# Patient Record
Sex: Female | Born: 1945
Health system: Southern US, Community
[De-identification: ages and names within clinical notes are randomized; demographics above are authoritative.]

## PROBLEM LIST (undated history)

## (undated) DIAGNOSIS — K635 Polyp of colon: Secondary | ICD-10-CM

## (undated) DIAGNOSIS — D649 Anemia, unspecified: Secondary | ICD-10-CM

## (undated) DIAGNOSIS — M1990 Unspecified osteoarthritis, unspecified site: Secondary | ICD-10-CM

## (undated) DIAGNOSIS — Z973 Presence of spectacles and contact lenses: Secondary | ICD-10-CM

## (undated) DIAGNOSIS — T8859XA Other complications of anesthesia, initial encounter: Secondary | ICD-10-CM

## (undated) DIAGNOSIS — F419 Anxiety disorder, unspecified: Secondary | ICD-10-CM

## (undated) DIAGNOSIS — F32A Depression, unspecified: Secondary | ICD-10-CM

## (undated) DIAGNOSIS — K5790 Diverticulosis of intestine, part unspecified, without perforation or abscess without bleeding: Secondary | ICD-10-CM

## (undated) DIAGNOSIS — D0510 Intraductal carcinoma in situ of unspecified breast: Secondary | ICD-10-CM

## (undated) DIAGNOSIS — M2550 Pain in unspecified joint: Secondary | ICD-10-CM

## (undated) DIAGNOSIS — Z972 Presence of dental prosthetic device (complete) (partial): Secondary | ICD-10-CM

## (undated) DIAGNOSIS — E785 Hyperlipidemia, unspecified: Secondary | ICD-10-CM

## (undated) DIAGNOSIS — R112 Nausea with vomiting, unspecified: Secondary | ICD-10-CM

## (undated) DIAGNOSIS — Z800 Family history of malignant neoplasm of digestive organs: Secondary | ICD-10-CM

## (undated) DIAGNOSIS — K08109 Complete loss of teeth, unspecified cause, unspecified class: Secondary | ICD-10-CM

## (undated) DIAGNOSIS — C50919 Malignant neoplasm of unspecified site of unspecified female breast: Secondary | ICD-10-CM

## (undated) DIAGNOSIS — C4490 Unspecified malignant neoplasm of skin, unspecified: Secondary | ICD-10-CM

## (undated) DIAGNOSIS — Z9889 Other specified postprocedural states: Secondary | ICD-10-CM

## (undated) DIAGNOSIS — T4145XA Adverse effect of unspecified anesthetic, initial encounter: Secondary | ICD-10-CM

## (undated) HISTORY — DX: Unspecified malignant neoplasm of skin, unspecified: C44.90

## (undated) HISTORY — DX: Diverticulosis of intestine, part unspecified, without perforation or abscess without bleeding: K57.90

## (undated) HISTORY — DX: Pain in unspecified joint: M25.50

## (undated) HISTORY — DX: Hyperlipidemia, unspecified: E78.5

## (undated) HISTORY — DX: Family history of malignant neoplasm of digestive organs: Z80.0

## (undated) HISTORY — DX: Malignant neoplasm of unspecified site of unspecified female breast: C50.919

## (undated) HISTORY — PX: COLONOSCOPY: SHX174

## (undated) HISTORY — DX: Polyp of colon: K63.5

## (undated) HISTORY — PX: POLYPECTOMY: SHX149

## (undated) HISTORY — DX: Intraductal carcinoma in situ of unspecified breast: D05.10

## (undated) HISTORY — PX: DILATION AND CURETTAGE OF UTERUS: SHX78

---

## 2008-01-11 DIAGNOSIS — C50919 Malignant neoplasm of unspecified site of unspecified female breast: Secondary | ICD-10-CM

## 2008-01-11 DIAGNOSIS — D0510 Intraductal carcinoma in situ of unspecified breast: Secondary | ICD-10-CM

## 2008-01-11 HISTORY — DX: Intraductal carcinoma in situ of unspecified breast: D05.10

## 2008-01-11 HISTORY — DX: Malignant neoplasm of unspecified site of unspecified female breast: C50.919

## 2008-12-10 HISTORY — PX: BREAST LUMPECTOMY: SHX2

## 2012-03-28 ENCOUNTER — Telehealth: Payer: Self-pay | Admitting: Family Medicine

## 2012-03-28 NOTE — Telephone Encounter (Signed)
Pt placed 04/05/2012 @ 10:30 a.m.

## 2012-03-28 NOTE — Telephone Encounter (Signed)
Yes but please not on a day that I already have more than 2 new patients.

## 2012-03-28 NOTE — Telephone Encounter (Signed)
Pt just moved here from [place] and needs to est w/a PCP. She has just rec'd notification from a breast ctr in [place] that she needs a biopsy of her right breast and needs to be seen as soon as possible.  In order for her to see someone about it, she needs a referral from a PCP here in [place].  Pt has [facility] HMO and your first available New [facility] Pt appmt isn't until after 07/17/2012.  Can you accommodate her an earlier apptmt?? Thank you.

## 2012-04-05 ENCOUNTER — Encounter: Payer: Self-pay | Admitting: Family Medicine

## 2012-04-05 ENCOUNTER — Ambulatory Visit (INDEPENDENT_AMBULATORY_CARE_PROVIDER_SITE_OTHER): Payer: Medicare HMO | Admitting: Family Medicine

## 2012-04-05 VITALS — BP 120/80 | HR 68 | Temp 98.0°F | Ht 60.25 in | Wt 175.0 lb

## 2012-04-05 DIAGNOSIS — N6310 Unspecified lump in the right breast, unspecified quadrant: Secondary | ICD-10-CM | POA: Insufficient documentation

## 2012-04-05 DIAGNOSIS — N63 Unspecified lump in unspecified breast: Secondary | ICD-10-CM

## 2012-04-05 NOTE — Patient Instructions (Signed)
It was really nice to meet you.  Please stop by to see Shirlee Limerick on your way out.  She will set up your appointment with the [facility].  Please schedule a welcome to medicare visit (ok to do anytime -use two acute slots).

## 2012-04-05 NOTE — Progress Notes (Signed)
  Subjective:    Patient ID: Kayla Mcgrath, female    DOB: [birth year], 67 y.o.   MRN: [id number]  HPI  67 yo here to establish care and to get referral for breast biopsy.  H/o right breast DCIS in 2010.  Has been going for diagnostic mammograms- initially every six months and now yearly. Diag mammogram, additional views and ultrasound were concerning for new masses.  She was advised to get breast biopsy done once she moved here.  No family h/o breast, uterine or cervical cancer.  Patient Active Problem List  Diagnosis  . Breast mass, right   Past Medical History  Diagnosis Date  . DCIS (ductal carcinoma in situ) of breast 2010  . Hyperlipidemia    No past surgical history on file. History  Substance Use Topics  . Smoking status: Former Games developer  . Smokeless tobacco: Not on file  . Alcohol Use: Not on file   Family History  Problem Relation Age of Onset  . Alcohol abuse Mother   . Alcohol abuse Father   . Mental illness Father    Allergies  Allergen Reactions  . Latex Rash   No current outpatient prescriptions on file prior to visit.   No current facility-administered medications on file prior to visit.   The PMH, PSH, Social History, Family History, Medications, and allergies have been reviewed in [employer], and have been updated if relevant.    Review of Systems See HPI   Denies any night sweats, fevers, fatigue or weight loss.   Objective:   Physical Exam BP 120/80  Pulse 68  Temp(Src) 98 F (36.7 C)  Ht 5' 0.25" (1.53 m)  Wt 175 lb (79.379 kg)  BMI 33.91 kg/m2  General:  Well-developed,well-nourished,in no acute distress; alert,appropriate and cooperative throughout examination Head:  normocephalic and atraumatic.   Eyes:  vision grossly intact, pupils equal, pupils round, and pupils reactive to light.   Ears:  R ear normal and L ear normal.   Nose:  no external deformity.   Mouth:  good dentition.   Neck:  No deformities, masses, or tenderness noted. Lungs:   Normal respiratory effort, chest expands symmetrically. Lungs are clear to auscultation, no crackles or wheezes. Heart:  Normal rate and regular rhythm. S1 and S2 normal without gallop, murmur, click, rub or other extra sounds. Msk:  No deformity or scoliosis noted of thoracic or lumbar spine.   Extremities:  No clubbing, cyanosis, edema, or deformity noted with normal full range of motion of all joints.   Neurologic:  alert & oriented X3 and gait normal.   Skin:  Intact without suspicious lesions or rashes Psych:  Cognition and judgment appear intact. Alert and cooperative with normal attention span and concentration. No apparent delusions, illusions, hallucinations    Assessment & Plan:  1. Breast mass, right Refer to Breast Center for biopsy. The patient indicates understanding of these issues and agrees with the plan.  - Ambulatory referral to Breast Clinic

## 2012-04-12 ENCOUNTER — Other Ambulatory Visit: Payer: Self-pay | Admitting: Family Medicine

## 2012-04-12 DIAGNOSIS — C50919 Malignant neoplasm of unspecified site of unspecified female breast: Secondary | ICD-10-CM

## 2012-04-12 DIAGNOSIS — Z853 Personal history of malignant neoplasm of breast: Secondary | ICD-10-CM

## 2012-04-12 DIAGNOSIS — R921 Mammographic calcification found on diagnostic imaging of breast: Secondary | ICD-10-CM

## 2012-04-13 ENCOUNTER — Other Ambulatory Visit: Payer: Self-pay | Admitting: Family Medicine

## 2012-04-13 ENCOUNTER — Telehealth: Payer: Self-pay | Admitting: *Deleted

## 2012-04-13 DIAGNOSIS — Z853 Personal history of malignant neoplasm of breast: Secondary | ICD-10-CM

## 2012-04-13 DIAGNOSIS — N6310 Unspecified lump in the right breast, unspecified quadrant: Secondary | ICD-10-CM

## 2012-04-13 NOTE — Telephone Encounter (Signed)
Signed and on my desk 

## 2012-04-13 NOTE — Telephone Encounter (Signed)
Order form for diagnostic mammogram is on your desk for signature.

## 2012-04-23 ENCOUNTER — Other Ambulatory Visit: Payer: Self-pay | Admitting: Family Medicine

## 2012-04-23 DIAGNOSIS — Z0000 Encounter for general adult medical examination without abnormal findings: Secondary | ICD-10-CM

## 2012-04-23 DIAGNOSIS — Z136 Encounter for screening for cardiovascular disorders: Secondary | ICD-10-CM

## 2012-04-24 ENCOUNTER — Ambulatory Visit
Admission: RE | Admit: 2012-04-24 | Discharge: 2012-04-24 | Disposition: A | Discharge status: Home / Self Care | Payer: Medicare HMO | Source: Ambulatory Visit | Attending: Family Medicine | Admitting: Family Medicine

## 2012-04-24 DIAGNOSIS — Z853 Personal history of malignant neoplasm of breast: Secondary | ICD-10-CM

## 2012-04-24 DIAGNOSIS — R921 Mammographic calcification found on diagnostic imaging of breast: Secondary | ICD-10-CM

## 2012-04-24 DIAGNOSIS — N6310 Unspecified lump in the right breast, unspecified quadrant: Secondary | ICD-10-CM

## 2012-04-24 HISTORY — PX: BREAST BIOPSY: SHX20

## 2012-04-27 ENCOUNTER — Telehealth: Payer: Self-pay | Admitting: *Deleted

## 2012-04-27 DIAGNOSIS — C50211 Malignant neoplasm of upper-inner quadrant of right female breast: Secondary | ICD-10-CM | POA: Insufficient documentation

## 2012-04-27 NOTE — Telephone Encounter (Signed)
Confirmed [facility] for 05/02/12 at 0800 .  Instructions and contact information given. 

## 2012-04-30 ENCOUNTER — Encounter: Payer: Self-pay | Admitting: Family Medicine

## 2012-04-30 ENCOUNTER — Other Ambulatory Visit (INDEPENDENT_AMBULATORY_CARE_PROVIDER_SITE_OTHER): Payer: Medicare HMO

## 2012-04-30 DIAGNOSIS — Z0000 Encounter for general adult medical examination without abnormal findings: Secondary | ICD-10-CM

## 2012-04-30 DIAGNOSIS — Z136 Encounter for screening for cardiovascular disorders: Secondary | ICD-10-CM

## 2012-04-30 LAB — LIPID PANEL
Cholesterol: 244 mg/dL — ABNORMAL HIGH (ref 0–200)
Total CHOL/HDL Ratio: 4
Triglycerides: 89 mg/dL (ref 0.0–149.0)
VLDL: 17.8 mg/dL (ref 0.0–40.0)

## 2012-04-30 LAB — CBC WITH DIFFERENTIAL/PLATELET
Basophils Absolute: 0 10*3/uL (ref 0.0–0.1)
Eosinophils Relative: 3.9 % (ref 0.0–5.0)
Lymphocytes Relative: 20.7 % (ref 12.0–46.0)
Monocytes Relative: 7.8 % (ref 3.0–12.0)
Platelets: 251 10*3/uL (ref 150.0–400.0)
RDW: 13.5 % (ref 11.5–14.6)
WBC: 7.1 10*3/uL (ref 4.5–10.5)

## 2012-04-30 LAB — COMPREHENSIVE METABOLIC PANEL
AST: 28 U/L (ref 0–37)
Alkaline Phosphatase: 84 U/L (ref 39–117)
Glucose, Bld: 91 mg/dL (ref 70–99)
Potassium: 4.5 mEq/L (ref 3.5–5.1)
Sodium: 139 mEq/L (ref 135–145)
Total Bilirubin: 0.8 mg/dL (ref 0.3–1.2)
Total Protein: 7.7 g/dL (ref 6.0–8.3)

## 2012-04-30 LAB — LDL CHOLESTEROL, DIRECT: Direct LDL: 161.9 mg/dL

## 2012-05-02 ENCOUNTER — Encounter: Payer: Self-pay | Admitting: Oncology

## 2012-05-02 ENCOUNTER — Ambulatory Visit: Payer: Medicare HMO

## 2012-05-02 ENCOUNTER — Other Ambulatory Visit (HOSPITAL_BASED_OUTPATIENT_CLINIC_OR_DEPARTMENT_OTHER): Payer: Medicare HMO | Admitting: Lab

## 2012-05-02 ENCOUNTER — Other Ambulatory Visit: Payer: Self-pay | Admitting: *Deleted

## 2012-05-02 ENCOUNTER — Ambulatory Visit (HOSPITAL_BASED_OUTPATIENT_CLINIC_OR_DEPARTMENT_OTHER): Payer: Medicare HMO | Admitting: Oncology

## 2012-05-02 ENCOUNTER — Encounter: Payer: Self-pay | Admitting: *Deleted

## 2012-05-02 ENCOUNTER — Ambulatory Visit
Admission: RE | Admit: 2012-05-02 | Discharge: 2012-05-02 | Disposition: A | Discharge status: Home / Self Care | Payer: Medicare HMO | Source: Ambulatory Visit | Attending: Radiation Oncology | Admitting: Radiation Oncology

## 2012-05-02 ENCOUNTER — Ambulatory Visit: Payer: Medicare HMO | Attending: General Surgery | Admitting: Physical Therapy

## 2012-05-02 ENCOUNTER — Ambulatory Visit (HOSPITAL_BASED_OUTPATIENT_CLINIC_OR_DEPARTMENT_OTHER): Payer: Medicare HMO | Admitting: General Surgery

## 2012-05-02 ENCOUNTER — Telehealth: Payer: Self-pay | Admitting: Oncology

## 2012-05-02 VITALS — BP 144/80 | HR 84 | Temp 98.0°F | Resp 20 | Ht 60.0 in | Wt 176.1 lb

## 2012-05-02 DIAGNOSIS — C50219 Malignant neoplasm of upper-inner quadrant of unspecified female breast: Secondary | ICD-10-CM

## 2012-05-02 DIAGNOSIS — C50919 Malignant neoplasm of unspecified site of unspecified female breast: Secondary | ICD-10-CM | POA: Insufficient documentation

## 2012-05-02 DIAGNOSIS — IMO0001 Reserved for inherently not codable concepts without codable children: Secondary | ICD-10-CM | POA: Insufficient documentation

## 2012-05-02 DIAGNOSIS — C50211 Malignant neoplasm of upper-inner quadrant of right female breast: Secondary | ICD-10-CM

## 2012-05-02 DIAGNOSIS — N63 Unspecified lump in unspecified breast: Secondary | ICD-10-CM

## 2012-05-02 DIAGNOSIS — N6310 Unspecified lump in the right breast, unspecified quadrant: Secondary | ICD-10-CM

## 2012-05-02 DIAGNOSIS — R293 Abnormal posture: Secondary | ICD-10-CM | POA: Insufficient documentation

## 2012-05-02 LAB — COMPREHENSIVE METABOLIC PANEL (CC13)
AST: 25 U/L (ref 5–34)
Albumin: 3.6 g/dL (ref 3.5–5.0)
BUN: 14.1 mg/dL (ref 7.0–26.0)
CO2: 26 mEq/L (ref 22–29)
Calcium: 10.5 mg/dL — ABNORMAL HIGH (ref 8.4–10.4)
Chloride: 105 mEq/L (ref 98–107)
Glucose: 98 mg/dl (ref 70–99)
Potassium: 4.1 mEq/L (ref 3.5–5.1)

## 2012-05-02 LAB — CBC WITH DIFFERENTIAL/PLATELET
Basophils Absolute: 0 10*3/uL (ref 0.0–0.1)
EOS%: 2 % (ref 0.0–7.0)
Eosinophils Absolute: 0.1 10*3/uL (ref 0.0–0.5)
HGB: 14 g/dL (ref 11.6–15.9)
MONO#: 0.4 10*3/uL (ref 0.1–0.9)
NEUT#: 3.4 10*3/uL (ref 1.5–6.5)
RDW: 13.1 % (ref 11.2–14.5)
WBC: 5.1 10*3/uL (ref 3.9–10.3)
lymph#: 1.2 10*3/uL (ref 0.9–3.3)

## 2012-05-02 NOTE — Assessment & Plan Note (Signed)
Patient has a new diagnosis of right breast cancer. This is an in breast recurrence from her prior DCIS. She also has an area of ADH. She is interested in breast conservation. We will perform an MRI to make sure there are no additional lesions that we need to worry about. As long as this is negative we will plan to do to needle localized lumpectomy is with a right sentinel lymph node biopsy. She does have generous sized breasts. I think she would have a reasonable cosmetic result with 2 lumpectomies.  She is interested postoperatively in seeking reduction on the opposite side. She is having her MRI next week. Once this result is available, we will schedule her surgery otherwise she doesn't need additional MRI guided biopsies.  The surgical procedure was described to the patient.  I discussed the incision type and location and that we would need radiology involved on the day of surgery with a wire marker and sentinel node biopsy.      The risks and benefits of the procedure were described to the patient and he/she wishes to proceed.    We discussed the risks bleeding, infection, damage to other structures, need for further procedures/surgeries.  We discussed the risk of seroma.  The patient was advised if the area in the breast in cancer, we may need to go back to surgery for additional tissue to obtain negative margins or for a lymph node biopsy. The patient was advised that these are the most common complications, but that others can occur as well.  They were advised against taking aspirin or other anti-inflammatory agents/blood thinners the week before surgery.

## 2012-05-02 NOTE — Progress Notes (Signed)
Chief Complaint  Patient presents with  . Breast Cancer    HISTORY: Patient is a 67 year old female who presents with a  right breast mass detected on followup mammograms.. She has a history of DCIS in 2010 and that was detected when she had a major duct excision for nipple discharge.  For reasons that are not clear to me, she did not undergo radiation. She took tamoxifen for a while but stopped because she is only taking medication. She now has 2 areas of concern seen on her mammogram. There is a area at 2:00 that was 7 mm and an area 9:00 that was 2 mm. These haven't been biopsied. The 2:00 lesion showed invasive ductal carcinoma with ER, PR receptors positive and HER-2 not overexpressed. The Ki-67 was 35%. The other lesion at 9:00 with atypical ductal hyperplasia.  She has no family history of cancer.  Menarche age 18. She is no longer having periods and has not had periods for around 16 years. She has not used hormone replacement but did use some hormonal contraception. She had 2 children her first at age 21. She's been married for 46 years. She has had a colonoscopy, bone density scan and a Pap smear.  Past Medical History  Diagnosis Date  . DCIS (ductal carcinoma in situ) of breast 2010  . Hyperlipidemia   . Breast cancer   . Skin cancer   . Joint pain     Past Surgical History  Procedure Laterality Date  . Right lumpectomy  december 2010    Current Outpatient Prescriptions  Medication Sig Dispense Refill  . Cholecalciferol (VITAMIN D) 2000 UNITS CAPS Take one by mouth daily       No current facility-administered medications for this visit.     Allergies  Allergen Reactions  . Latex Rash     Family History  Problem Relation Age of Onset  . Alcohol abuse Mother   . Alcohol abuse Father   . Mental illness Father      History   Social History  . Marital Status: Married    Spouse Name: N/A    Number of Children: N/A  . Years of Education: N/A   Social History  Main Topics  . Smoking status: Former Games developer  . Smokeless tobacco: Not on file  . Alcohol Use: No  . Drug Use: No  . Sexually Active: Not on file   Social History Narrative   Recently moved from [place] to be closer to her two daughters and grandchildren.   Retired Public house manager.     REVIEW OF SYSTEMS - PERTINENT POSITIVES ONLY: 12 point review of systems negative other than HPI and PMH except for history of kidney infections, glasses, dentures, joint pain.    EXAM: Wt Readings from Last 3 Encounters:  05/02/12 176 lb 1.6 oz (79.878 kg)  04/05/12 175 lb (79.379 kg)   Temp Readings from Last 3 Encounters:  05/02/12 98 F (36.7 C) Oral  04/05/12 98 F (36.7 C)    BP Readings from Last 3 Encounters:  05/02/12 144/80  04/05/12 120/80   Pulse Readings from Last 3 Encounters:  05/02/12 84  04/05/12 68    Gen:  No acute distress.  Well nourished and well groomed.   Neurological: Alert and oriented to person, place, and time. Coordination normal.  Head: Normocephalic and atraumatic.  Eyes: Conjunctivae are normal. Pupils are equal, round, and reactive to light. No scleral icterus.  Neck: Normal range of motion. Neck supple. No tracheal deviation  or thyromegaly present.  Cardiovascular: Normal rate, regular rhythm, normal heart sounds and intact distal pulses.  Exam reveals no gallop and no friction rub.  No murmur heard. Respiratory: Effort normal.  No respiratory distress. No chest wall tenderness. Breath sounds normal.  No wheezes, rales or rhonchi.  Breast:  Ptotic breasts bilaterally.  No palpable masses, skin dimpling, nipple discharge, nipple retraction.  There is no axillary lymphadenopathy. GI: Soft. Bowel sounds are normal. The abdomen is soft and nontender.  There is no rebound and no guarding.  Musculoskeletal: Normal range of motion. Extremities are nontender.  Lymphadenopathy: No cervical, preauricular, postauricular or axillary adenopathy is present Skin: Skin is warm and dry.  No rash noted. No diaphoresis. No erythema. No pallor. No clubbing, cyanosis, or edema.   Psychiatric: Normal mood and affect. Behavior is normal. Judgment and thought content normal.    LABORATORY RESULTS: Available labs are reviewed  CBC, CMET normal other than sl elevated calcium  RADIOLOGY RESULTS: See E-Chart or I-Site for most recent results.  Images and reports are reviewed. Mammogram 2 o'clock 0.7 cm mass calcs at 9 o'clock 0.2 cm near lumpectomy   ASSESSMENT AND PLAN: Cancer of upper-inner quadrant of female breast Patient has a new diagnosis of right breast cancer. This is an in breast recurrence from her prior DCIS. She also has an area of ADH. She is interested in breast conservation. We will perform an MRI to make sure there are no additional lesions that we need to worry about. As long as this is negative we will plan to do to needle localized lumpectomy is with a right sentinel lymph node biopsy. She does have generous sized breasts. I think she would have a reasonable cosmetic result with 2 lumpectomies.  She is interested postoperatively in seeking reduction on the opposite side. She is having her MRI next week. Once this result is available, we will schedule her surgery otherwise she doesn't need additional MRI guided biopsies.  The surgical procedure was described to the patient.  I discussed the incision type and location and that we would need radiology involved on the day of surgery with a wire marker and sentinel node biopsy.      The risks and benefits of the procedure were described to the patient and he/she wishes to proceed.    We discussed the risks bleeding, infection, damage to other structures, need for further procedures/surgeries.  We discussed the risk of seroma.  The patient was advised if the area in the breast in cancer, we may need to go back to surgery for additional tissue to obtain negative margins or for a lymph node biopsy. The patient was advised  that these are the most common complications, but that others can occur as well.  They were advised against taking aspirin or other anti-inflammatory agents/blood thinners the week before surgery.     If the second area at 9 o'clock is cancer, we would recommend completion mastectomy.  Maudry Diego MD Surgical Oncology, General and Endocrine Surgery [facility]   Visit Diagnoses: 1. Cancer of upper-inner quadrant of female breast, right     Primary Care Physician: Ruthe Mannan, MD   [facility]

## 2012-05-02 NOTE — Progress Notes (Signed)
Checked in new pt with no financial concerns. °

## 2012-05-02 NOTE — Progress Notes (Signed)
Kayla Mcgrath   DOB: Dec 28, 1945  MR#: [id number]  WGN#:[id number]  PCP: Ruthe Mannan, MD GYN:  SUAlmond Lint OTHER MD: Lurline Hare; Rosalva Ferron (FAX [phone])   HISTORY OF PRESENT ILLNESS: Kayla Mcgrath underwent right lumpectomy in December of 2010 for a 1.2 cm ductal carcinoma in situ, intermediate grade, with close but negative margins (1 mm). I do not have the prognostic panel, but per patient and history the tumor was estrogen receptor positive and Ronald took tamoxifen for approximately one year. She declined radiation.  She was then followed closely, with mammography every 6 months. On 03/08/2012 a new area of amorphous calcifications measuring 2 mm were noted at the 9:00 position of the right breast. There was also an isodense oval mass measuring 7 mm at the 2:00 position in the same breast. Ultrasound of the right breast confirmed a 6 mm oval hypoechoic mass with microlobulated margins.  The patient was referred to the breast Center for biopsy, and on for 15 2014 underwent biopsy of the small area of calcifications in the right breast as well as the breast mass in the same breast. These showed ([id number]) invasive ductal carcinoma, grade 2 or 3, estrogen receptor 100% positive, progesterone receptor 10% positive, with an MIB-1 of 35%, and no HER-2 amplification. The small area of calcification showed only atypical ductal hyperplasia.  The patient's subsequent history is as detailed below.  INTERVAL HISTORY: Kayla Mcgrath was seen at the multidisciplinary breast cancer clinic 05/02/2012 accompanied by her husband Kayla Mcgrath. Her case was also discussed at conference this morning.  REVIEW OF SYSTEMS: She had no symptoms leading to her mammography, which was routine. She tolerated her biopsies without unusual side effects. She denies headaches, visual changes, dizziness or gait imbalance, nausea, vomiting, cough, phlegm production, pleurisy, or shortness of breath. She has mild stress incontinence.  She has occasional joint pains, which are not more intense or persistent than usual. She had what by her report is a squamous cell carcinoma removed from her right arm remotely. There is no history of melanoma. A detailed review of systems today was otherwise noncontributory  PAST MEDICAL HISTORY: Past Medical History  Diagnosis Date  . DCIS (ductal carcinoma in situ) of breast 2010  . Hyperlipidemia   . Breast cancer   . Skin cancer   . Joint pain     PAST SURGICAL HISTORY: Past Surgical History  Procedure Laterality Date  . Right lumpectomy  december 2010    FAMILY HISTORY Family History  Problem Relation Age of Onset  . Alcohol abuse Mother   . Alcohol abuse Father   . Mental illness Father    the patient's father committed suicide at the age of 7. The patient's mother died at the age of 31 in the setting of alcohol abuse. The patient has one brother, 2 sisters. There is no history of breast or ovarian cancer in the family to the patient's knowledge.  GYNECOLOGIC HISTORY: Menarche age 52, first live birth age 42, the patient is GX P2. She went through menopause approximately 1998. She did not take hormone replacement.  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 Her husband Kayla Mcgrath, currently retired, used to Public house manager. Daughter Kayla Mcgrath lives in [place] and works as an Charity fundraiser for [employer] and also at [employer] in [place]. Daughter Kayla Mcgrath l is a homemaker in [place]. The patient has 6 grandchildren and one on the way. She is a International aid/development worker.   ADVANCED DIRECTIVES: Not in  place  HEALTH MAINTENANCE: History  Substance Use Topics  . Smoking status: Former Games developer  . Smokeless tobacco: Not on file  . Alcohol Use: No     Colonoscopy: 2009  PAP: 2012  Bone density: 2012  Lipid panel:  Allergies  Allergen Reactions  . Latex Rash    Current Outpatient Prescriptions  Medication Sig Dispense Refill  . Cholecalciferol  (VITAMIN D) 2000 UNITS CAPS Take one by mouth daily       No current facility-administered medications for this visit.    OBJECTIVE: Middle-aged white woman in no acute distress Filed Vitals:   05/02/12 0836  BP: 144/80  Pulse: 84  Temp: 98 F (36.7 C)  Resp: 20     Body mass index is 34.39 kg/(m^2).    ECOG FS: 0  Sclerae unicteric Oropharynx clear No cervical or supraclavicular adenopathy Lungs no rales or rhonchi Heart regular rate and rhythm Abd benign MSK scoliosis but no focal spinal tenderness, no peripheral edema Neuro: nonfocal, well oriented, pleasant affect Breasts: The right breast is status post recent biopsy, with a small ecchymosis visible. There is no palpable mass. There is no other skin change or nipple retraction. There is no palpable adenopathy in the right axilla. The left breast is also status post recent biopsy. There are no skin or nipple changes in the left axilla also is unremarkable.   LAB RESULTS: Lab Results  Component Value Date   WBC 5.1 05/02/2012   NEUTROABS 3.4 05/02/2012   HGB 14.0 05/02/2012   HCT 42.9 05/02/2012   MCV 92.1 05/02/2012   PLT 219 05/02/2012      Chemistry      Component Value Date/Time   NA 140 05/02/2012 0822   NA 139 04/30/2012 0903   K 4.1 05/02/2012 0822   K 4.5 04/30/2012 0903   CL 105 05/02/2012 0822   CL 104 04/30/2012 0903   CO2 26 05/02/2012 0822   CO2 28 04/30/2012 0903   BUN 14.1 05/02/2012 0822   BUN 16 04/30/2012 0903   CREATININE 0.8 05/02/2012 0822   CREATININE 0.8 04/30/2012 0903      Component Value Date/Time   CALCIUM 10.5* 05/02/2012 0822   CALCIUM 10.0 04/30/2012 0903   ALKPHOS 96 05/02/2012 0822   ALKPHOS 84 04/30/2012 0903   AST 25 05/02/2012 0822   AST 28 04/30/2012 0903   ALT 21 05/02/2012 0822   ALT 26 04/30/2012 0903   BILITOT 0.43 05/02/2012 0822   BILITOT 0.8 04/30/2012 0903       No results found for this basename: LABCA2    No components found with this basename: LABCA125    No results found  for this basename: INR,  in the last 168 hours  Urinalysis No results found for this basename: colorurine,  appearanceur,  labspec,  phurine,  glucoseu,  hgbur,  bilirubinur,  ketonesur,  proteinur,  urobilinogen,  nitrite,  leukocytesur    STUDIES: Mm Rt Breast Bx W Loc Dev 1st Lesion Image Bx Spec Stereo Guide  04/26/2012  **ADDENDUM** CREATED: 04/26/2012 13:58:07  The patient was called by myself Dr. Micheline Maze.  The patient states she is doing well since her stereotactic right breast biopsy without problems at the biopsy site.  The patient was given the results of her stereotactic right breast biopsy which was compatible with [facility]/ [facility].  The pathology results are concordant with imaging findings. Patient's immediate questions and concerns were addressed.  She was given an appointment to the Multidisciplinary Clinic  05/02/2012 and 02:00 p.m.  She will be contacted by a representative from the [facility] with further information.  **END ADDENDUM** SIGNED BY: Melton Alar. Micheline Maze, M.D.   04/24/2012  *RADIOLOGY REPORT*  Clinical Data:  Right breast calcifications.  Please see the right breast ultrasound core biopsy report for history as well.  STEREOTACTIC-GUIDED VACUUM ASSISTED BIOPSY OF THE RIGHT BREAST AND SPECIMEN RADIOGRAPH  Comparison: Previous exams.  I met with the patient and we discussed the procedure of stereotactic-guided biopsy, including benefits and alternatives. We discussed the high likelihood of a successful procedure. We discussed the risks of the procedure, including infection, bleeding, tissue injury, clip migration, and inadequate sampling. Informed, written consent was given.  Using sterile technique, 2% lidocaine, stereotactic guidance, and a 9 gauge vacuum assisted device, biopsy was performed of the tiny cluster of calcifications located laterally within the right breast using a inferior craniocaudal approach.  Specimen radiograph was performed, showing representative calcifications within the  specimen.  Specimens with calcifications are identified for pathology.  At the conclusion of the procedure, a T-shaped tissue marker clip was deployed into the biopsy cavity.  Follow-up 2-view mammogram confirmed clip to be in appropriate position. Also, the ribbon shaped clip placed within the right breast during ultrasound core biopsy procedure was in appropriate position.  IMPRESSION: Stereotactic-guided biopsy of right breast calcifications as discussed above.  No apparent complications. Appropriate positioning of clips following right breast ultrasound guided core biopsy and right breast stereotactic core biopsy.   Original Report Authenticated By: Rolla Plate, M.D.    [place] Rt Breast Bx W Loc Dev 1st Lesion Img Bx Spec US Guide  04/26/2012  **ADDENDUM** CREATED: 04/26/2012 13:26:54  The patient was called by myself Dr. Micheline Maze.  The patient states she is doing well since her ultrasound core right breast biopsy.  She reports no problems at the biopsy site.  The patient was given the results of her biopsy which was compatible with invasive ductal carcinoma grade II as this is concordant with imaging findings. Patient's immediate questions and concerns were addressed.  The patient was given a follow-up appointment at the Multidisciplinary Clinic 05/02/2012 at 02:00 p.m.  She will be contacted by a representative from the [facility] with further information.  **END ADDENDUM** SIGNED BY: Melton Alar. Micheline Maze, M.D.   04/24/2012  *RADIOLOGY REPORT*  Clinical Data:  The patient has a history of a malignant lumpectomy of the right breast in [place] in 2010 (following a central duct excision for spontaneous nipple discharge).  She declined radiation therapy and tamoxifen therapy at that time.  She was recently found on imaging in [place] to have a right breast mass located at the 2 o'clock position and new suspicious calcifications located within the lateral right breast.  Imaging guided biopsies of these areas were  recommended.  The patient has recently moved to [place], [place] and has been referred to our facility for these biopsy procedures.  ULTRASOUND GUIDED CORE BIOPSY OF THE right BREAST  Comparison: Mammograms from [place] dated 03/08/2012, 01/20/2011, 12/10/2009 and right breast ultrasound dated 03/08/2012.  I met with the patient and we discussed the procedure of ultrasound- guided biopsy, including benefits and alternatives.  We discussed the high likelihood of a successful procedure. We discussed the risks of the procedure, including infection, bleeding, tissue injury, clip migration, and inadequate sampling.  Informed written consent was given.  Using sterile technique 2% lidocaine, ultrasound guidance and a 14 gauge automated biopsy device, biopsy was performed of the mass located within  the right breast at 2 o'clock position using a medial approach.  At the conclusion of the procedure a ribbon shaped tissue marker clip was deployed into the biopsy cavity. Follow up 2 view mammogram was performed and dictated separately.  IMPRESSION: Ultrasound guided biopsy of the right breast mass located 2 o'clock position as discussed above.  No apparent complications.  Original Report Authenticated By: Rolla Plate, M.D.   Patient: TATYANA, BIBER Collected: 04/24/2012 Client: [facility]   Accession: [id number] Received: 04/24/2012 F. Rolla Plate, MD DOB: May 15, 1945 Age: 67 Gender: F Reported: 04/26/2012 [address] Patient Ph: [phone] MRN #: [id number] [place], [place] [address] Client Acc#: Chart #: [id number] Phone: [phone] Fax: CC: Ruthe Mannan, MD REPORT OF SURGICAL PATHOLOGY ADDITIONAL INFORMATION: 1. PROGNOSTIC INDICATORS - ACIS Results: IMMUNOHISTOCHEMICAL AND MORPHOMETRIC ANALYSIS BY THE AUTOMATED CELLULAR IMAGING SYSTEM (ACIS) Estrogen Receptor: 100%, POSITIVE, STRONG STAINING INTENSITY Progesterone Receptor: 10%, POSITIVE, MODERATE STAINING  INTENSITY Proliferation Marker Ki67: 35% REFERENCE RANGE ESTROGEN RECEPTOR NEGATIVE <1% POSITIVE =>1% PROGESTERONE RECEPTOR NEGATIVE <1% POSITIVE =>1% All controls stained appropriately Jimmy Picket MD Pathologist, Electronic Signature ( Signed 04/27/2012) 1. CHROMOGENIC IN-SITU HYBRIDIZATION Results: HER-2/NEU BY CISH - NO AMPLIFICATION OF HER-2 DETECTED. RESULT RATIO OF HER2: CEP 17 SIGNALS 1.14 AVERAGE HER2 COPY NUMBER PER CELL 2.85 REFERENCE RANGE 1 of 3 Duplicate copy FINAL for Mcgrath, Kayla ([id number]) ADDITIONAL INFORMATION:(continued) NEGATIVE HER2/Chr17 Ratio <2.0 and Average HER2 copy number <4.0 EQUIVOCAL HER2/Chr17 Ratio <2.0 and Average HER2 copy number 4.0 and <6.0 POSITIVE HER2/Chr17 Ratio >=2.0 and/or Average HER2 copy number >=6.0 Jimmy Picket MD Pathologist, Electronic Signature ( Signed 04/27/2012) FINAL DIAGNOSIS Diagnosis 1. Breast, right, needle core biopsy, 2 o'clock mass - INVASIVE DUCTAL CARCINOMA, SEE COMMENT. 2. Breast, right, needle core biopsy, lateral - ATYPICAL DUCTAL HYPERPLASIA, SEE COMMENT. - LOBULAR NEOPLASIA (ATYPICAL HYPERPLASIA). - MICROCALCIFICATIONS IDENTIFIED. Microscopic Comment 1. Although the grade of tumor is best assessed at resection, with these biopsies, the invasive tumor is grade II. Breast prognostic studies are pending and will be reported in an addendum. The case was reviewed with Dr Raynald Blend, who concurs. 2. The core biopsies demonstrate definitive features of lobular neoplasia (atypical hyperplasia); confirmed with E-cadherin immunostain. Adjacent to large hyalinized and sclerosed ducts with coarse calcifications there are foci of ductal epithelium with cytoarchitectural features consistent with atypical ductal hyperplasia. Additional non neoplastic findings include fibrocystic change and microcalcifications in benign ducts and lobules. (CRR:gt, 04/25/12) [place] RUND DO Pathologist, Electronic Signature (Case signed  04/26/2012)  ASSESSMENT: 67 y.o. [place] woman  (1) status post right lumpectomy December of 2010 for ductal carcinoma in situ, intermediate grade, estrogen receptor positive, with close but negative margins, the patient refusing adjuvant radiation therapy, but taking adjuvant tamoxifen approximately one year  (2) status post right breast biopsy 04/26/2012 for a clinical T1b N0, stage IA invasive ductal carcinoma, intermediate grade, estrogen receptor 100% and progesterone receptor 10% positive, with no HER-2 amplification, and an MIB-1 of 35%  (3) a second area biopsied in the right breast for 17 2014 showed atypical ductal hyperplasia. This area is in a different quadrant from the invasive carcinoma.   PLAN: We spent the better part of today's hour-plus visit going over the nature of breast cancer, treatment options, and the particulars of Alexyss's situation. We are going to obtain a breast MRI to make sure there are no other areas of concern in the right breast. If there aren't, she is a candidate for excisional biopsy of the small area of atypical  ductal hyperplasia, and right lumpectomy with sentinel lymph node biopsy for the invasive tumor. She would then receive adjuvant radiation treatment.  As far as systemic adjuvant treatment is concerned, she will certainly benefit from antiestrogens. The benefit of chemotherapy is likely to be marginal. To help [place] with that decision we will send an Oncotype DX test from the lumpectomy sample. I would expect those results to be available within 2 weeks postop, and accordingly I have made Peityn a return appointment with me towards the end of May. At that point we should be able to make a definitive decision regarding chemotherapy.  She knows to call us with any questions or concerns prior to that visit.   Zuzanna Maroney C    05/02/2012

## 2012-05-02 NOTE — Progress Notes (Signed)
Radiation Oncology         [phone]) [phone] ________________________________  Initial outpatient Consultation - Date: 05/02/2012   Name: Kayla Mcgrath MRN: [id number]   DOB: [birth year]  REFERRING PHYSICIAN: Almond Lint, MD  DIAGNOSIS: T1N0 right breast cancer  HISTORY OF PRESENT ILLNESS::Kayla Mcgrath is a 67 y.o. female  who presented with a history of right DCIS. She underwent a excisional biopsy of which showed carcinoma in situ which was intermediate grade. She had close margins. Do to her distance from her radiation treatment facility she did not pursue adjuvant radiation. She presented for a followup mammogram and ultrasound have 2 areas of concern including a 7 mm lesion at the 2:00 position and a satellite nodule close to this measuring 6 x 4 x 4 mm. Biopsy of the 7 mm lesion showed a grade 2 invasive ductal carcinoma which was ER/PR positive HER-2 negative. Ki-67 was 35%. An area of the previous DCIS near the area low calcifications were noted measuring 2-3 mm. These have been biopsied and found to be ADH/ALH. She has no family history of malignancy. She is GX P2 with first childbirth at the age of 67.  PREVIOUS RADIATION THERAPY: No  PAST MEDICAL HISTORY:  has a past medical history of DCIS (ductal carcinoma in situ) of breast (2010); Hyperlipidemia; Breast cancer; Skin cancer; and Joint pain.    PAST SURGICAL HISTORY: Past Surgical History  Procedure Laterality Date  . Right lumpectomy  december 2010    FAMILY HISTORY: No family history of malignancy  SOCIAL HISTORY:  History  Substance Use Topics  . Smoking status: Former Games developer  . Smokeless tobacco: Not on file  . Alcohol Use: No    ALLERGIES: Latex  MEDICATIONS:  Current Outpatient Prescriptions  Medication Sig Dispense Refill  . Cholecalciferol (VITAMIN D) 2000 UNITS CAPS Take one by mouth daily       No current facility-administered medications for this encounter.    REVIEW OF SYSTEMS:  A 15 point review of  systems is documented in the electronic medical record. This was obtained by the nursing staff. However, I reviewed this with the patient to discuss relevant findings and make appropriate changes.  Pertinent items are noted in HPI.    PHYSICAL EXAM: There were no vitals filed for this visit.. Is pleasant female who appears her stated age sitting comfortably examining table. She has large pendulous breasts bilaterally. She has bruising in the outer portion of the right breast. This is associated with a palpable myxoma. No abnormal axillary supraclavicular cervical lymph nodes. The left breast is negative. She is alert minus x3. Her strength is 5 out of 5 bilaterally.  LABORATORY DATA:  Lab Results  Component Value Date   WBC 5.1 05/02/2012   HGB 14.0 05/02/2012   HCT 42.9 05/02/2012   MCV 92.1 05/02/2012   PLT 219 05/02/2012   Lab Results  Component Value Date   NA 140 05/02/2012   K 4.1 05/02/2012   CL 105 05/02/2012   CO2 26 05/02/2012   Lab Results  Component Value Date   ALT 21 05/02/2012   AST 25 05/02/2012   ALKPHOS 96 05/02/2012   BILITOT 0.43 05/02/2012     RADIOGRAPHY: Mm Rt Breast Bx W Loc Dev 1st Lesion Image Bx Spec Stereo Guide  04/26/2012  **ADDENDUM** CREATED: 04/26/2012 13:58:07  The patient was called by myself Dr. Micheline Maze.  The patient states she is doing well since her stereotactic right breast biopsy without problems at the biopsy site.  The patient was given the results of her stereotactic right breast biopsy which was compatible with ADH/ [facility].  The pathology results are concordant with imaging findings. Patient's immediate questions and concerns were addressed.  She was given an appointment to the Multidisciplinary Clinic 05/02/2012 and 02:00 p.m.  She will be contacted by a representative from the [facility] with further information.  **END ADDENDUM** SIGNED BY: Melton Alar. Micheline Maze, M.D.   04/24/2012  *RADIOLOGY REPORT*  Clinical Data:  Right breast calcifications.  Please see the right  breast ultrasound core biopsy report for history as well.  STEREOTACTIC-GUIDED VACUUM ASSISTED BIOPSY OF THE RIGHT BREAST AND SPECIMEN RADIOGRAPH  Comparison: Previous exams.  I met with the patient and we discussed the procedure of stereotactic-guided biopsy, including benefits and alternatives. We discussed the high likelihood of a successful procedure. We discussed the risks of the procedure, including infection, bleeding, tissue injury, clip migration, and inadequate sampling. Informed, written consent was given.  Using sterile technique, 2% lidocaine, stereotactic guidance, and a 9 gauge vacuum assisted device, biopsy was performed of the tiny cluster of calcifications located laterally within the right breast using a inferior craniocaudal approach.  Specimen radiograph was performed, showing representative calcifications within the specimen.  Specimens with calcifications are identified for pathology.  At the conclusion of the procedure, a T-shaped tissue marker clip was deployed into the biopsy cavity.  Follow-up 2-view mammogram confirmed clip to be in appropriate position. Also, the ribbon shaped clip placed within the right breast during ultrasound core biopsy procedure was in appropriate position.  IMPRESSION: Stereotactic-guided biopsy of right breast calcifications as discussed above.  No apparent complications. Appropriate positioning of clips following right breast ultrasound guided core biopsy and right breast stereotactic core biopsy.   Original Report Authenticated By: Rolla Plate, M.D.    [place] Rt Breast Bx W Loc Dev 1st Lesion Img Bx Spec US Guide  04/26/2012  **ADDENDUM** CREATED: 04/26/2012 13:26:54  The patient was called by myself Dr. Micheline Maze.  The patient states she is doing well since her ultrasound core right breast biopsy.  She reports no problems at the biopsy site.  The patient was given the results of her biopsy which was compatible with invasive ductal carcinoma grade II as this is  concordant with imaging findings. Patient's immediate questions and concerns were addressed.  The patient was given a follow-up appointment at the Multidisciplinary Clinic 05/02/2012 at 02:00 p.m.  She will be contacted by a representative from the [facility] with further information.  **END ADDENDUM** SIGNED BY: Melton Alar. Micheline Maze, M.D.   04/24/2012  *RADIOLOGY REPORT*  Clinical Data:  The patient has a history of a malignant lumpectomy of the right breast in [place] in 2010 (following a central duct excision for spontaneous nipple discharge).  She declined radiation therapy and tamoxifen therapy at that time.  She was recently found on imaging in [place] to have a right breast mass located at the 2 o'clock position and new suspicious calcifications located within the lateral right breast.  Imaging guided biopsies of these areas were recommended.  The patient has recently moved to [place], [place] and has been referred to our facility for these biopsy procedures.  ULTRASOUND GUIDED CORE BIOPSY OF THE right BREAST  Comparison: Mammograms from [place] dated 03/08/2012, 01/20/2011, 12/10/2009 and right breast ultrasound dated 03/08/2012.  I met with the patient and we discussed the procedure of ultrasound- guided biopsy, including benefits and alternatives.  We discussed the high likelihood of a successful procedure. We discussed  the risks of the procedure, including infection, bleeding, tissue injury, clip migration, and inadequate sampling.  Informed written consent was given.  Using sterile technique 2% lidocaine, ultrasound guidance and a 14 gauge automated biopsy device, biopsy was performed of the mass located within the right breast at 2 o'clock position using a medial approach.  At the conclusion of the procedure a ribbon shaped tissue marker clip was deployed into the biopsy cavity. Follow up 2 view mammogram was performed and dictated separately.  IMPRESSION: Ultrasound guided biopsy of the right breast  mass located 2 o'clock position as discussed above.  No apparent complications.  Original Report Authenticated By: Rolla Plate, M.D.       IMPRESSION: Recurrent Invasive Ductal Carcinoma as well as ADH/ALH  PLAN: Technically speaking is minimal is a in breast recurrence from her previous DCIS. She did not receive radiation and only took tamoxifen for a short time. She would therefore be eligible for lumpectomy again. She does have this other area of ADH unfortunately. She does have large breast size and therefore these 2 areas could be excised with reasonable cosmetic outcome. We have ordered an MRI scan to ensure that there is no disease between these 2 lesions or other disease within the breast was recently addressed. If there is disease she would benefit from mastectomy. There is not she will undergo excision of the ADH and a lumpectomy of the invasive cancer. Medical oncology is recommended a Oncotype test. Hopefully that'll be low. She would then be a candidate for antiestrogen therapy. She does undergo lumpectomy she would benefit from adjuvant radiation. We discussed 6 weeks of treatment as an outpatient. We discussed the process of simulation the placement tattoos. We discussed possible skin redness and fatigue as side effects. We discussed the low probability of lung and rib damage. If she does elect for mastectomy she would not need postoperative radiation therapy based on any clinical characteristics I see at this time. I think she would be a candidate for immediate reconstruction if she is interested. She did briefly discuss possibly receiving radiation at [facility]. I would be in support of this if the disease here on her and her husband. I be happy to discuss this with her as well as indications for radiation after surgery.  I spent 40 minutes  face to face with the patient and more than 50% of that time was spent in counseling and/or coordination of care.     ------------------------------------------------  Lurline Hare, MD

## 2012-05-04 ENCOUNTER — Telehealth (INDEPENDENT_AMBULATORY_CARE_PROVIDER_SITE_OTHER): Payer: Self-pay

## 2012-05-04 ENCOUNTER — Other Ambulatory Visit: Payer: Medicare HMO

## 2012-05-04 ENCOUNTER — Other Ambulatory Visit (INDEPENDENT_AMBULATORY_CARE_PROVIDER_SITE_OTHER): Payer: Self-pay | Admitting: General Surgery

## 2012-05-04 ENCOUNTER — Ambulatory Visit
Admission: RE | Admit: 2012-05-04 | Discharge: 2012-05-04 | Disposition: A | Discharge status: Home / Self Care | Payer: Medicare HMO | Source: Ambulatory Visit | Attending: General Surgery | Admitting: General Surgery

## 2012-05-04 DIAGNOSIS — C50211 Malignant neoplasm of upper-inner quadrant of right female breast: Secondary | ICD-10-CM

## 2012-05-04 DIAGNOSIS — R928 Other abnormal and inconclusive findings on diagnostic imaging of breast: Secondary | ICD-10-CM

## 2012-05-04 MED ORDER — GADOBENATE DIMEGLUMINE 529 MG/ML IV SOLN
15.0000 mL | Freq: Once | INTRAVENOUS | Status: AC | PRN
  Administered 2012-05-04: 15 mL via INTRAVENOUS

## 2012-05-04 NOTE — Telephone Encounter (Signed)
Pt is scheduled for an MR guided bx 05/11/12 at 7:15 a.m.

## 2012-05-07 ENCOUNTER — Telehealth: Payer: Self-pay | Admitting: *Deleted

## 2012-05-07 ENCOUNTER — Ambulatory Visit (INDEPENDENT_AMBULATORY_CARE_PROVIDER_SITE_OTHER): Payer: Medicare HMO | Admitting: Family Medicine

## 2012-05-07 ENCOUNTER — Encounter: Payer: Self-pay | Admitting: Family Medicine

## 2012-05-07 VITALS — BP 100/60 | HR 84 | Temp 98.0°F | Ht 60.0 in | Wt 175.0 lb

## 2012-05-07 DIAGNOSIS — F4323 Adjustment disorder with mixed anxiety and depressed mood: Secondary | ICD-10-CM

## 2012-05-07 DIAGNOSIS — Z1211 Encounter for screening for malignant neoplasm of colon: Secondary | ICD-10-CM

## 2012-05-07 DIAGNOSIS — E785 Hyperlipidemia, unspecified: Secondary | ICD-10-CM

## 2012-05-07 DIAGNOSIS — Z23 Encounter for immunization: Secondary | ICD-10-CM

## 2012-05-07 DIAGNOSIS — Z66 Do not resuscitate: Secondary | ICD-10-CM | POA: Insufficient documentation

## 2012-05-07 DIAGNOSIS — C50211 Malignant neoplasm of upper-inner quadrant of right female breast: Secondary | ICD-10-CM

## 2012-05-07 DIAGNOSIS — Z139 Encounter for screening, unspecified: Secondary | ICD-10-CM

## 2012-05-07 DIAGNOSIS — Z0000 Encounter for general adult medical examination without abnormal findings: Secondary | ICD-10-CM

## 2012-05-07 DIAGNOSIS — Z2911 Encounter for prophylactic immunotherapy for respiratory syncytial virus (RSV): Secondary | ICD-10-CM

## 2012-05-07 MED ORDER — ALPRAZOLAM 0.25 MG PO TABS: 0.2500 mg | ORAL_TABLET | Freq: Two times a day (BID) | ORAL | Status: DC | PRN

## 2012-05-07 NOTE — Progress Notes (Signed)
Subjective:    Patient ID: Kayla Mcgrath, female    DOB: 11-09-1945, 67 y.o.   MRN: [id number]  HPI  67 yo pleasant female here for welcome to medicare visit.  I have personally reviewed the Medicare Annual Wellness questionnaire and have noted 1. The patient's medical and social history 2. Their use of alcohol, tobacco or illicit drugs 3. Their current medications and supplements 4. The patient's functional ability including ADL's, fall risks, home safety risks and hearing or visual             impairment. 5. Diet and physical activities 6. Evidence for depression or mood disorders  End of life wishes discussed and updated in Social History.  Established care with me last month.  Unfortunately, since that time, she has been diagnosed with breast CA recurrence.   She presented for a followup mammogram and ultrasound last month that have 2 areas of concern including a 7 mm lesion at the 2:00 position and a satellite nodule close to this measuring 6 x 4 x 4 mm. Biopsy of the 7 mm lesion showed a grade 2 invasive ductal carcinoma which was ER/PR positive HER-2 negative.  She has discussed options with the Breast Clinic and had decided to pursue lumpectomy with 6 months of subsequent radiation.  Unfortunately, on Friday, another lesion on MRI in right upper quadrant was suspicious and she is now scheduled MRI guided biopsy of this lesion.  Understandably, she is now upset and quite anxious about this new news.  Not sleeping well, more tearful but does not feel depressed.  Great support system.  Husband and daughter have been very supportive.  HLD-  LDL elevated.  Previously on statins- caused myalgais. Lab Results  Component Value Date   CHOL 244* 04/30/2012   HDL 59.00 04/30/2012   LDLDIRECT 161.9 04/30/2012   TRIG 89.0 04/30/2012   CHOLHDL 4 04/30/2012     Review of Systems     Objective:   Physical Exam BP 100/60  Pulse 84  Temp(Src) 98 F (36.7 C)  Ht 5' (1.524 m)  Wt 175 lb  (79.379 kg)  BMI 34.18 kg/m2  General:  Well-developed,well-nourished,in no acute distress; alert,appropriate and cooperative throughout examination Head:  normocephalic and atraumatic.   Eyes:  vision grossly intact, pupils equal, pupils round, and pupils reactive to light.   Ears:  R ear normal and L ear normal.   Nose:  no external deformity.   Mouth:  good dentition.   Neck:  No deformities, masses, or tenderness noted. Lungs:  Normal respiratory effort, chest expands symmetrically. Lungs are clear to auscultation, no crackles or wheezes. Heart:  Normal rate and regular rhythm. S1 and S2 normal without gallop, murmur, click, rub or other extra sounds. Abdomen:  Bowel sounds positive,abdomen soft and non-tender without masses, organomegaly or hernias noted. Msk:  No deformity or scoliosis noted of thoracic or lumbar spine.   Extremities:  No clubbing, cyanosis, edema, or deformity noted with normal full range of motion of all joints.   Neurologic:  alert & oriented X3 and gait normal.   Skin:  Intact without suspicious lesions or rashes Cervical Nodes:  No lymphadenopathy noted Axillary Nodes:  No palpable lymphadenopathy Psych:  Cognition and judgment appear intact. Alert and cooperative with normal attention span and concentration. No apparent delusions, illusions, hallucinations        Assessment & Plan:  1. Cancer of upper-inner quadrant of female breast, right With MRI guided biopsy scheduled for this Friday.  2. HLD (hyperlipidemia) She would like to work on diet for now.  Will recheck labs after her cancer treatments. No family h/o CAD. The patient indicates understanding of these issues and agrees with the plan.   3. Welcome to Medicare preventive visit The patients weight, height, BMI and visual acuity have been recorded in the chart I have made referrals, counseling and provided education to the patient based review of the above and I have provided the pt with a  written personalized care plan for preventive services.  - EKG 12-Lead -Zostavax -Pneumovax -IFOB  4. Special screening for malignant neoplasms, colon  - Fecal occult blood, imunochemical; Future  5. DNR (do not resuscitate) Forms filled out and returned to pt.  7. Adjustment disorder with mixed anxiety and depressed mood Discussed tx options.  She would like to try low dose benzo prn sleep. She is aware of sedation and addiction potential.

## 2012-05-07 NOTE — Patient Instructions (Addendum)
Good to see you, Kayla Mcgrath. Please keep in touch. Please stop back by the lab to get your stool cards.

## 2012-05-07 NOTE — Addendum Note (Signed)
Addended by: Eliezer Bottom on: 05/07/2012 11:23 AM   Modules accepted: Orders

## 2012-05-07 NOTE — Telephone Encounter (Signed)
Spoke to pt concerning [facility] from 05/02/12.  Pt denies questions or concerns regarding dx or treatment care plan.  Confirmed pt date for MRI bx on 05/11/12.  Informed pt that based on the results from bx would determine type of surgery.  Received verbal understanding.  Encourage pt to call with further needs.  Contact information given.

## 2012-05-11 ENCOUNTER — Encounter: Payer: Self-pay | Admitting: *Deleted

## 2012-05-11 ENCOUNTER — Ambulatory Visit
Admission: RE | Admit: 2012-05-11 | Discharge: 2012-05-11 | Disposition: A | Discharge status: Home / Self Care | Payer: Medicare HMO | Source: Ambulatory Visit | Attending: General Surgery | Admitting: General Surgery

## 2012-05-11 DIAGNOSIS — R928 Other abnormal and inconclusive findings on diagnostic imaging of breast: Secondary | ICD-10-CM

## 2012-05-11 HISTORY — PX: BREAST BIOPSY: SHX20

## 2012-05-11 MED ORDER — GADOBENATE DIMEGLUMINE 529 MG/ML IV SOLN
16.0000 mL | Freq: Once | INTRAVENOUS | Status: AC | PRN
  Administered 2012-05-11: 16 mL via INTRAVENOUS

## 2012-05-11 NOTE — Progress Notes (Signed)
Mailed after appt letter to pt. 

## 2012-05-16 ENCOUNTER — Other Ambulatory Visit (INDEPENDENT_AMBULATORY_CARE_PROVIDER_SITE_OTHER): Payer: Self-pay | Admitting: General Surgery

## 2012-05-16 ENCOUNTER — Telehealth (INDEPENDENT_AMBULATORY_CARE_PROVIDER_SITE_OTHER): Payer: Self-pay | Admitting: General Surgery

## 2012-05-16 DIAGNOSIS — C50911 Malignant neoplasm of unspecified site of right female breast: Secondary | ICD-10-CM

## 2012-05-16 NOTE — Telephone Encounter (Signed)
Discussed MRI with patient and biopsy results.   Pt has 3 areas that need to come out.  2 o'clock is invasive ductal, 9 o'clock is ADH, 10-11 o'clock is DCIS.    Would not be able to get good cosmesis with 3 lumpectomies in pt that has had prior lumpectomy.  Will set up for plastic surgery consult for possible immediate reconstruction with mastectomy/SLN.  Going to see Dr. Kelly Splinter today.

## 2012-05-22 ENCOUNTER — Other Ambulatory Visit (INDEPENDENT_AMBULATORY_CARE_PROVIDER_SITE_OTHER): Payer: Medicare HMO

## 2012-05-22 ENCOUNTER — Encounter: Payer: Self-pay | Admitting: *Deleted

## 2012-05-22 DIAGNOSIS — Z1211 Encounter for screening for malignant neoplasm of colon: Secondary | ICD-10-CM

## 2012-05-22 LAB — FECAL OCCULT BLOOD, IMMUNOCHEMICAL: Fecal Occult Bld: NEGATIVE

## 2012-05-25 ENCOUNTER — Other Ambulatory Visit (INDEPENDENT_AMBULATORY_CARE_PROVIDER_SITE_OTHER): Payer: Self-pay | Admitting: General Surgery

## 2012-05-25 DIAGNOSIS — C50911 Malignant neoplasm of unspecified site of right female breast: Secondary | ICD-10-CM

## 2012-05-29 ENCOUNTER — Encounter: Payer: Self-pay | Admitting: *Deleted

## 2012-05-30 ENCOUNTER — Telehealth: Payer: Self-pay | Admitting: *Deleted

## 2012-05-30 NOTE — Telephone Encounter (Signed)
sw pt gv appt for 07/10/12@ 2pm for labs , and to see GCM at 2:30pm. Pt wants me to cancel 06/08/12 ov due to she will be having a procedure. i also made pt aware that i would mail a letter/cal...td

## 2012-06-01 ENCOUNTER — Other Ambulatory Visit: Payer: Self-pay | Admitting: *Deleted

## 2012-06-01 DIAGNOSIS — C50211 Malignant neoplasm of upper-inner quadrant of right female breast: Secondary | ICD-10-CM

## 2012-06-07 ENCOUNTER — Other Ambulatory Visit: Payer: Self-pay | Admitting: Plastic Surgery

## 2012-06-07 DIAGNOSIS — C50911 Malignant neoplasm of unspecified site of right female breast: Secondary | ICD-10-CM

## 2012-06-07 NOTE — H&P (Signed)
This document contains confidential information from a [facility] medical record system and may be unauthenticated. Release may be made only with a valid authorization or in accordance with applicable policies of Medical Center or its affiliates. This document must be maintained in a secure manner or discarded/destroyed as required by Medical Center policy or by a confidential means such as shredding.     Kayla Mcgrath  05/16/2012 10:00 AM   Office Visit  MRN:  [id number]  Department: Plastic Surgery  Dept Phone: [phone]  Description: Female DOB: 1945-05-15  Provider: Wayland Denis, DO    Diagnoses   Breast cancer, right    -  Primary   174.9     Reason for Visit   Breast Reconstruction    Vitals - Last Recorded   130/80  70  98 F (36.7 C)  20  1.554 m (5' 1.2")  79.379 kg (175 lb)  32.87 kg/m2  99%             Subjective:     Patient ID: Kayla Mcgrath is a 67 y.o. female.  HPI The patient is a 67 yrs old wf here with her husband for evaluation for breast reconstruction.  She has a new diagnosis of right breast invasive ductal carcinoma with ER/PR positive, HER-2 negative pathology.  She had lumpectomies in the past for right DCIS 2010. She is not planning on chemo or radiation and no genetic testing.  She has been on hormone replacement for many years.  She has two kids.  She has not had any other surgery, just breast.  She is 5 feet 1 inch tall, weighs 175 pounds and wears a 36 DD bra. She would like to be around a C cup.  She has decided on a right mastectomy. She does not have any DM or HTN.  She has significant breast ptosis and will need the excess skin excised for shaping the breast at the time of the surgery.  No lymphadenopathy palpated. The bruising on the right breast is healing well.  The following portions of the patient's history were reviewed and updated as appropriate: allergies, current medications, past family history, past medical history, past social  history, past surgical history and problem list.   Review of Systems  Constitutional: Negative.   HENT: Negative.   Eyes: Negative.   Respiratory: Negative.   Gastrointestinal: Negative.   Genitourinary: Negative.   Neurological: Negative.   Hematological: Negative.   Psychiatric/Behavioral: Negative.     Objective:    Physical Exam  Constitutional: She appears well-developed and well-nourished.  HENT:   Head: Normocephalic and atraumatic.  Eyes: Conjunctivae are normal. Pupils are equal, round, and reactive to light.  Neck: Normal range of motion.  Abdominal: Soft. She exhibits no distension. There is no tenderness.  Musculoskeletal: Normal range of motion.  Neurological: She is alert.  Skin: Skin is warm.  Psychiatric: She has a normal mood and affect. Her behavior is normal. Judgment and thought content normal.      Assessment:  1.  Breast cancer, right        Plan:    Assessment and Plan:   A long, detailed conversation was had regarding the patient's options for breast reconstruction. Five main points, which are explained to all breast reconstruction patients, were discussed.   1. Breast reconstruction is an optional process.   2. Breast reconstruction is a multi-stage process which involves multiple surgeries spaced several months apart. The entire process can take over  one year.   3. The major goal of breast reconstruction is to have the patient look normal in clothing. When naked, there will always be scars.   4. Asymmetries are often present during the reconstruction process. Several operations may be needed, including surgery to the non-cancerous breast, to achieve satisfactory results.   5. No matter the reconstructive method, there are ways that the reconstruction can fail and a secondary reconstructive plan would need to be created.   A general discussion regarding all available methods of breast reconstruction were discussed. The types of reconstructions described  included.   1. Tissue expander and implant based reconstruction, both single and multi-stage approaches.   2. Autologous only reconstructions, including free abdominal-tissue based reconstructions.   3. Combination procedures, particularly latissismus dorsi flaps combined with either expanders or implants.   For each of the reconstruction methods mentioned above, the risks, benefits, alternatives, scarring, and recovery time were discussed in great detail. Specific risks detailed included bleeding, infection, hematoma, seroma, scarring, pain, wound healing complications, flap loss, fat necrosis, capsular contracture, need for implant removal, donor site complications, bulge, hernia, umbilical necrosis, need for urgent reoperation, and need for dressing changes were discussed.    Assessment   Once all reconstruction options were presented, a focused discussion was had regarding the patient's suitability for each of these procedures.   A total of 50 minutes of face-to-face time was spent in this encounter, of which >50% was spent in counseling. The patient is a good candidate for right immediate breast reconstruction with Expander/FlexHD followed by left breast mastopexy/reduction at the time of the second procedure for symmetry.

## 2012-06-08 ENCOUNTER — Ambulatory Visit: Payer: Medicare HMO | Admitting: Oncology

## 2012-06-14 ENCOUNTER — Encounter (HOSPITAL_BASED_OUTPATIENT_CLINIC_OR_DEPARTMENT_OTHER): Payer: Self-pay | Admitting: *Deleted

## 2012-06-14 NOTE — Progress Notes (Signed)
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

## 2012-06-18 ENCOUNTER — Other Ambulatory Visit: Payer: Self-pay | Admitting: Plastic Surgery

## 2012-06-18 NOTE — H&P (Signed)
This document contains confidential information from a [facility] medical record system and may be unauthenticated. Release may be made only with a valid authorization or in accordance with applicable policies of Medical Center or its affiliates. This document must be maintained in a secure manner or discarded/destroyed as required by Medical Center policy or by a confidential means such as shredding.     Brandolyn Kirby  06/15/2012 9:15 AM   Office Visit  MRN:  [id number]  Department:  Plastic Surgery  Dept Phone: [phone]  Description: Female DOB: [birth year]  Provider: Fanny Bien Rayburn, PA-C   Diagnoses    Breast cancer, right    -  Primary   174.9   Vitals - Last Recorded   138/84  76  1.554 m (5' 1.18")           Subjective:    Patient ID: Kayla Mcgrath is a 67 y.o. female.  HPI The patient is a 67 yrs old wf here for history and physical for breast reconstruction.   History:  She has a new diagnosis of right breast invasive ductal carcinoma with ER/PR positive, HER-2 negative pathology.  She had lumpectomies in the past for right DCIS 2010. She is not planning on chemo or radiation and no genetic testing.  She has been on hormone replacement for many years.  She has two kids.  She has not had any other surgery, just breast.  She is 5 feet 1 inch tall, weighs 175 pounds and wears a 36 DD bra. She would like to be around a C cup.  She has decided on a right mastectomy. She does not have any DM or HTN.  She has significant breast ptosis and will need the excess skin excised for shaping the breast at the time of the surgery.  No lymphadenopathy palpated.   The following portions of the patient's history were reviewed and updated as appropriate: allergies, current medications, past family history, past medical history, past social history, past surgical history and problem list.  Review of Systems  Constitutional: Negative.   HENT: Negative.   Eyes: Negative.    Respiratory: Negative.   Cardiovascular: Negative.   Gastrointestinal: Negative.   Endocrine: Negative.   Genitourinary: Negative.   Allergic/Immunologic: Negative.   Neurological: Negative.   Hematological: Negative.   Psychiatric/Behavioral: Negative.       Objective:    Physical Exam  Constitutional: She is oriented to person, place, and time. She appears well-developed and well-nourished. No distress.  HENT:   Head: Normocephalic and atraumatic.   Nose: Nose normal.   Mouth/Throat: Oropharynx is clear and moist.  Eyes: Conjunctivae and EOM are normal. Pupils are equal, round, and reactive to light.  Neck: Normal range of motion. Neck supple. No tracheal deviation present.  Cardiovascular: Normal rate, regular rhythm and intact distal pulses.   Slightly diminished pulses in lower extremities and some mild edema  Pulmonary/Chest: Effort normal. No stridor. No respiratory distress.  Abdominal: Soft. She exhibits no distension and no mass. There is no tenderness.  Musculoskeletal: Normal range of motion.  Neurological: She is alert and oriented to person, place, and time.  Skin: Skin is warm and dry.  Psychiatric: She has a normal mood and affect. Her behavior is normal. Judgment and thought content normal.      Assessment:   1.  Breast cancer, right     Plan:   Right immediate breast reconstruction with Expander/FlexHD followed by left breast mastopexy/reduction at the time of  the second procedure for symmetry. The procedure possible risks, benefits and complications were discussed with the patient and she desires to proceed and consent was obtained.      Medications Ordered This Encounter     HYDROcodone-acetaminophen (NORCO) 5-325 mg per Take 1 tablet by mouth every 6 (six) hours as needed for 10 days for Pain.     diazepam (VALIUM) 2 MG tablet Take 1 tablet (2 mg total) by mouth every 6 (six) hours as needed for 10 days for Anxiety.    promethazine (PHENERGAN) 12.5 MG  tablet Take 1 tablet (12.5 mg total) by mouth every 6 (six) hours as needed for 7 days for Nausea.    cephalexin (KEFLEX) 500 MG capsule Take 1 capsule (500 mg total) by mouth 4 times daily.

## 2012-06-20 ENCOUNTER — Ambulatory Visit (HOSPITAL_BASED_OUTPATIENT_CLINIC_OR_DEPARTMENT_OTHER): Payer: Medicare HMO | Admitting: Anesthesiology

## 2012-06-20 ENCOUNTER — Encounter (HOSPITAL_BASED_OUTPATIENT_CLINIC_OR_DEPARTMENT_OTHER): Payer: Self-pay | Admitting: Anesthesiology

## 2012-06-20 ENCOUNTER — Ambulatory Visit (HOSPITAL_COMMUNITY)
Admission: RE | Admit: 2012-06-20 | Discharge: 2012-06-20 | Disposition: A | Discharge status: Home / Self Care | Payer: Medicare HMO | Source: Ambulatory Visit | Attending: General Surgery | Admitting: General Surgery

## 2012-06-20 ENCOUNTER — Ambulatory Visit (HOSPITAL_BASED_OUTPATIENT_CLINIC_OR_DEPARTMENT_OTHER)
Admission: RE | Admit: 2012-06-20 | Discharge: 2012-06-21 | Disposition: A | Discharge status: Home / Self Care | Payer: Medicare HMO | Source: Ambulatory Visit | Attending: General Surgery | Admitting: General Surgery

## 2012-06-20 ENCOUNTER — Encounter (HOSPITAL_BASED_OUTPATIENT_CLINIC_OR_DEPARTMENT_OTHER): Payer: Self-pay | Admitting: *Deleted

## 2012-06-20 ENCOUNTER — Encounter (HOSPITAL_BASED_OUTPATIENT_CLINIC_OR_DEPARTMENT_OTHER)
Admission: RE | Disposition: A | Discharge status: Home / Self Care | Payer: Self-pay | Source: Ambulatory Visit | Attending: General Surgery

## 2012-06-20 DIAGNOSIS — C50219 Malignant neoplasm of upper-inner quadrant of unspecified female breast: Secondary | ICD-10-CM | POA: Insufficient documentation

## 2012-06-20 DIAGNOSIS — Z170 Estrogen receptor positive status [ER+]: Secondary | ICD-10-CM | POA: Insufficient documentation

## 2012-06-20 DIAGNOSIS — C50911 Malignant neoplasm of unspecified site of right female breast: Secondary | ICD-10-CM

## 2012-06-20 DIAGNOSIS — D0590 Unspecified type of carcinoma in situ of unspecified breast: Secondary | ICD-10-CM

## 2012-06-20 DIAGNOSIS — N6481 Ptosis of breast: Secondary | ICD-10-CM | POA: Insufficient documentation

## 2012-06-20 HISTORY — DX: Presence of dental prosthetic device (complete) (partial): Z97.2

## 2012-06-20 HISTORY — DX: Presence of spectacles and contact lenses: Z97.3

## 2012-06-20 HISTORY — PX: MASTECTOMY W/ SENTINEL NODE BIOPSY: SHX2001

## 2012-06-20 HISTORY — PX: MASTECTOMY: SHX3

## 2012-06-20 HISTORY — PX: BREAST RECONSTRUCTION WITH PLACEMENT OF TISSUE EXPANDER AND FLEX HD (ACELLULAR HYDRATED DERMIS): SHX6295

## 2012-06-20 HISTORY — DX: Presence of dental prosthetic device (complete) (partial): K08.109

## 2012-06-20 LAB — POCT HEMOGLOBIN-HEMACUE: Hemoglobin: 15.9 g/dL — ABNORMAL HIGH (ref 12.0–15.0)

## 2012-06-20 SURGERY — MASTECTOMY WITH SENTINEL LYMPH NODE BIOPSY
Anesthesia: General | Site: Breast | Laterality: Right | Wound class: Clean

## 2012-06-20 MED ORDER — PANTOPRAZOLE SODIUM 40 MG IV SOLR: 40.0000 mg | Freq: Every day | INTRAVENOUS | Status: DC

## 2012-06-20 MED ORDER — OXYCODONE HCL 5 MG PO TABS: 5.0000 mg | ORAL_TABLET | Freq: Once | ORAL | Status: DC | PRN

## 2012-06-20 MED ORDER — ONDANSETRON HCL 4 MG/2ML IJ SOLN: 4.0000 mg | Freq: Four times a day (QID) | INTRAMUSCULAR | Status: DC | PRN

## 2012-06-20 MED ORDER — ACETAMINOPHEN 650 MG RE SUPP: 650.0000 mg | Freq: Four times a day (QID) | RECTAL | Status: DC | PRN

## 2012-06-20 MED ORDER — SCOPOLAMINE 1 MG/3DAYS TD PT72
1.0000 | MEDICATED_PATCH | Freq: Once | TRANSDERMAL | Status: DC
  Administered 2012-06-20: 1.5 mg via TRANSDERMAL

## 2012-06-20 MED ORDER — ONDANSETRON HCL 4 MG/2ML IJ SOLN
INTRAMUSCULAR | Status: DC | PRN
  Administered 2012-06-20: 4 mg via INTRAVENOUS

## 2012-06-20 MED ORDER — SODIUM CHLORIDE 0.9 % IV SOLN
3.0000 g | Freq: Four times a day (QID) | INTRAVENOUS | Status: DC
  Administered 2012-06-20 – 2012-06-21 (×3): 3 g via INTRAVENOUS

## 2012-06-20 MED ORDER — METOCLOPRAMIDE HCL 5 MG/ML IJ SOLN: 10.0000 mg | Freq: Once | INTRAMUSCULAR | Status: DC | PRN

## 2012-06-20 MED ORDER — MIDAZOLAM HCL 2 MG/2ML IJ SOLN
1.0000 mg | INTRAMUSCULAR | Status: DC | PRN
  Administered 2012-06-20: 1 mg via INTRAVENOUS

## 2012-06-20 MED ORDER — PROPOFOL 10 MG/ML IV BOLUS
INTRAVENOUS | Status: DC | PRN
  Administered 2012-06-20: 200 mg via INTRAVENOUS

## 2012-06-20 MED ORDER — SUCCINYLCHOLINE CHLORIDE 20 MG/ML IJ SOLN
INTRAMUSCULAR | Status: DC | PRN
  Administered 2012-06-20: 100 mg via INTRAVENOUS

## 2012-06-20 MED ORDER — TECHNETIUM TC 99M SULFUR COLLOID FILTERED
1.0000 | Freq: Once | INTRAVENOUS | Status: AC | PRN
  Administered 2012-06-20: 1 via INTRADERMAL

## 2012-06-20 MED ORDER — MIDAZOLAM HCL 5 MG/5ML IJ SOLN
INTRAMUSCULAR | Status: DC | PRN
  Administered 2012-06-20: 1 mg via INTRAVENOUS

## 2012-06-20 MED ORDER — DIAZEPAM 2 MG PO TABS: 2.0000 mg | ORAL_TABLET | Freq: Two times a day (BID) | ORAL | Status: DC | PRN

## 2012-06-20 MED ORDER — LIDOCAINE HCL (CARDIAC) 20 MG/ML IV SOLN
INTRAVENOUS | Status: DC | PRN
  Administered 2012-06-20: 50 mg via INTRAVENOUS

## 2012-06-20 MED ORDER — KCL IN DEXTROSE-NACL 20-5-0.45 MEQ/L-%-% IV SOLN
INTRAVENOUS | Status: DC
  Administered 2012-06-20: 14:00:00 via INTRAVENOUS

## 2012-06-20 MED ORDER — DOCUSATE SODIUM 100 MG PO CAPS: 100.0000 mg | ORAL_CAPSULE | Freq: Three times a day (TID) | ORAL | Status: DC

## 2012-06-20 MED ORDER — CHLORHEXIDINE GLUCONATE 4 % EX LIQD: 1.0000 "application " | Freq: Once | CUTANEOUS | Status: DC

## 2012-06-20 MED ORDER — HYDROCODONE-ACETAMINOPHEN 5-325 MG PO TABS
1.0000 | ORAL_TABLET | ORAL | Status: DC | PRN
  Administered 2012-06-20 – 2012-06-21 (×4): 2 via ORAL

## 2012-06-20 MED ORDER — CEFAZOLIN SODIUM-DEXTROSE 2-3 GM-% IV SOLR
2.0000 g | INTRAVENOUS | Status: AC
  Administered 2012-06-20: 2 g via INTRAVENOUS

## 2012-06-20 MED ORDER — SODIUM CHLORIDE 0.9 % IJ SOLN
INTRAMUSCULAR | Status: DC | PRN
  Administered 2012-06-20: 09:00:00

## 2012-06-20 MED ORDER — OXYCODONE HCL 5 MG/5ML PO SOLN: 5.0000 mg | Freq: Once | ORAL | Status: DC | PRN

## 2012-06-20 MED ORDER — CEFAZOLIN SODIUM-DEXTROSE 2-3 GM-% IV SOLR: 2.0000 g | INTRAVENOUS | Status: DC

## 2012-06-20 MED ORDER — HYDROMORPHONE HCL PF 1 MG/ML IJ SOLN
0.2500 mg | INTRAMUSCULAR | Status: DC | PRN
  Administered 2012-06-20 (×4): 0.5 mg via INTRAVENOUS

## 2012-06-20 MED ORDER — DEXAMETHASONE SODIUM PHOSPHATE 4 MG/ML IJ SOLN
INTRAMUSCULAR | Status: DC | PRN
  Administered 2012-06-20: 10 mg via INTRAVENOUS

## 2012-06-20 MED ORDER — FENTANYL CITRATE 0.05 MG/ML IJ SOLN
50.0000 ug | INTRAMUSCULAR | Status: DC | PRN
  Administered 2012-06-20: 50 ug via INTRAVENOUS

## 2012-06-20 MED ORDER — ACETAMINOPHEN 10 MG/ML IV SOLN
1000.0000 mg | Freq: Once | INTRAVENOUS | Status: AC
  Administered 2012-06-20: 1000 mg via INTRAVENOUS

## 2012-06-20 MED ORDER — SODIUM CHLORIDE 0.9 % IV SOLN
INTRAVENOUS | Status: DC | PRN
  Administered 2012-06-20: 105 mL via INTRAMUSCULAR

## 2012-06-20 MED ORDER — FENTANYL CITRATE 0.05 MG/ML IJ SOLN
INTRAMUSCULAR | Status: DC | PRN
  Administered 2012-06-20: 100 ug via INTRAVENOUS
  Administered 2012-06-20 (×2): 25 ug via INTRAVENOUS
  Administered 2012-06-20 (×2): 50 ug via INTRAVENOUS

## 2012-06-20 MED ORDER — MORPHINE SULFATE 2 MG/ML IJ SOLN
2.0000 mg | INTRAMUSCULAR | Status: DC | PRN
  Administered 2012-06-20: 2 mg via INTRAVENOUS

## 2012-06-20 MED ORDER — ACETAMINOPHEN 325 MG PO TABS: 650.0000 mg | ORAL_TABLET | Freq: Four times a day (QID) | ORAL | Status: DC | PRN

## 2012-06-20 MED ORDER — LACTATED RINGERS IV SOLN
INTRAVENOUS | Status: DC
  Administered 2012-06-20 (×3): via INTRAVENOUS

## 2012-06-20 MED ORDER — SODIUM CHLORIDE 0.9 % IR SOLN
Status: DC | PRN
  Administered 2012-06-20: 11:00:00

## 2012-06-20 MED ORDER — ALPRAZOLAM 0.25 MG PO TABS
0.2500 mg | ORAL_TABLET | Freq: Two times a day (BID) | ORAL | Status: DC | PRN
Start: 2012-06-20 — End: 2012-06-21

## 2012-06-20 SURGICAL SUPPLY — 95 items
BAG DECANTER FOR FLEXI CONT (MISCELLANEOUS) ×6 IMPLANT
BAG URINE DRAINAGE (UROLOGICAL SUPPLIES) ×2 IMPLANT
BANDAGE GAUZE ELAST BULKY 4 IN (GAUZE/BANDAGES/DRESSINGS) ×6 IMPLANT
BINDER BREAST LRG (GAUZE/BANDAGES/DRESSINGS) IMPLANT
BINDER BREAST MEDIUM (GAUZE/BANDAGES/DRESSINGS) IMPLANT
BINDER BREAST XLRG (GAUZE/BANDAGES/DRESSINGS) ×4 IMPLANT
BINDER BREAST XXLRG (GAUZE/BANDAGES/DRESSINGS) IMPLANT
BIOPATCH RED 1 DISK 7.0 (GAUZE/BANDAGES/DRESSINGS) ×2 IMPLANT
BLADE HEX COATED 2.75 (ELECTRODE) ×4 IMPLANT
BLADE SURG 10 STRL SS (BLADE) ×2 IMPLANT
BLADE SURG 15 STRL LF DISP TIS (BLADE) ×3 IMPLANT
BLADE SURG 15 STRL SS (BLADE) ×3
CANISTER SUCTION 1200CC (MISCELLANEOUS) ×4 IMPLANT
CATH FOLEY LATEX FREE 16FR (CATHETERS) ×1
CATH FOLEY LF 16FR (CATHETERS) ×1 IMPLANT
CHLORAPREP W/TINT 26ML (MISCELLANEOUS) ×2 IMPLANT
CLIP TI LARGE 6 (CLIP) ×4 IMPLANT
CLIP TI MEDIUM 6 (CLIP) ×4 IMPLANT
CLIP TI WIDE RED SMALL 6 (CLIP) IMPLANT
CLOTH BEACON ORANGE TIMEOUT ST (SAFETY) ×4 IMPLANT
CORDS BIPOLAR (ELECTRODE) IMPLANT
COVER MAYO STAND STRL (DRAPES) ×6 IMPLANT
COVER PROBE W GEL 5X96 (DRAPES) ×2 IMPLANT
COVER SURGICAL LIGHT HANDLE (MISCELLANEOUS) ×2 IMPLANT
COVER TABLE BACK 60X90 (DRAPES) ×2 IMPLANT
DECANTER SPIKE VIAL GLASS SM (MISCELLANEOUS) IMPLANT
DERMABOND ADVANCED (GAUZE/BANDAGES/DRESSINGS) ×1
DERMABOND ADVANCED .7 DNX12 (GAUZE/BANDAGES/DRESSINGS) ×1 IMPLANT
DRAIN CHANNEL 19F RND (DRAIN) ×4 IMPLANT
DRAPE LAPAROSCOPIC ABDOMINAL (DRAPES) ×2 IMPLANT
DRAPE UTILITY XL STRL (DRAPES) ×2 IMPLANT
DRSG PAD ABDOMINAL 8X10 ST (GAUZE/BANDAGES/DRESSINGS) ×2 IMPLANT
DRSG TEGADERM 2-3/8X2-3/4 SM (GAUZE/BANDAGES/DRESSINGS) ×2 IMPLANT
ELECT BLADE 4.0 EZ CLEAN MEGAD (MISCELLANEOUS) ×2
ELECT REM PT RETURN 9FT ADLT (ELECTROSURGICAL) ×2
ELECTRODE BLDE 4.0 EZ CLN MEGD (MISCELLANEOUS) ×1 IMPLANT
ELECTRODE REM PT RTRN 9FT ADLT (ELECTROSURGICAL) ×1 IMPLANT
EVACUATOR SILICONE 100CC (DRAIN) ×4 IMPLANT
GAUZE SPONGE 4X4 12PLY STRL LF (GAUZE/BANDAGES/DRESSINGS) IMPLANT
GLOVE BIO SURGEON STRL SZ 6 (GLOVE) IMPLANT
GLOVE BIO SURGEON STRL SZ 6.5 (GLOVE) IMPLANT
GLOVE BIOGEL PI IND STRL 6.5 (GLOVE) ×1 IMPLANT
GLOVE BIOGEL PI INDICATOR 6.5 (GLOVE) ×1
GLOVE SURG SS PI 6.5 STRL IVOR (GLOVE) ×14 IMPLANT
GOWN PREVENTION PLUS XLARGE (GOWN DISPOSABLE) ×10 IMPLANT
GOWN PREVENTION PLUS XXLARGE (GOWN DISPOSABLE) ×4 IMPLANT
GRAFT FLEX HD 4X16 THICK (Tissue Mesh) ×2 IMPLANT
IMPL BREAST TIS EXP M 350CC (Breast) ×1 IMPLANT
IMPLANT BREAST TIS EXP M 350CC (Breast) ×2 IMPLANT
IV NS 1000ML (IV SOLUTION)
IV NS 1000ML BAXH (IV SOLUTION) IMPLANT
IV NS 500ML (IV SOLUTION) ×2
IV NS 500ML BAXH (IV SOLUTION) ×2 IMPLANT
KIT FILL SYSTEM UNIVERSAL (SET/KITS/TRAYS/PACK) ×2 IMPLANT
NDL SAFETY ECLIPSE 18X1.5 (NEEDLE) ×1 IMPLANT
NEEDLE 21 GA WING INFUSION (NEEDLE) ×2 IMPLANT
NEEDLE HYPO 18GX1.5 SHARP (NEEDLE) ×1
NEEDLE HYPO 25X1 1.5 SAFETY (NEEDLE) ×4 IMPLANT
NEEDLE SPNL 18GX3.5 QUINCKE PK (NEEDLE) ×2 IMPLANT
NEEDLE SPNL 22GX3.5 QUINCKE BK (NEEDLE) ×2 IMPLANT
NON LATEX BIOGEL SKINSENSE ×2 IMPLANT
NS IRRIG 1000ML POUR BTL (IV SOLUTION) ×4 IMPLANT
PACK BASIN DAY SURGERY FS (CUSTOM PROCEDURE TRAY) ×4 IMPLANT
PACK UNIVERSAL I (CUSTOM PROCEDURE TRAY) ×2 IMPLANT
PENCIL BUTTON HOLSTER BLD 10FT (ELECTRODE) ×4 IMPLANT
PIN SAFETY STERILE (MISCELLANEOUS) ×4 IMPLANT
SET ASEPTIC TRANSFER (MISCELLANEOUS) ×4 IMPLANT
SLEEVE SCD COMPRESS KNEE MED (MISCELLANEOUS) ×2 IMPLANT
SPONGE GAUZE 4X4 12PLY (GAUZE/BANDAGES/DRESSINGS) ×2 IMPLANT
SPONGE LAP 18X18 X RAY DECT (DISPOSABLE) ×10 IMPLANT
STAPLER VISISTAT 35W (STAPLE) ×2 IMPLANT
STOCKINETTE IMPERVIOUS LG (DRAPES) ×2 IMPLANT
STRIP CLOSURE SKIN 1/2X4 (GAUZE/BANDAGES/DRESSINGS) ×2 IMPLANT
SUT ETHILON 2 0 FS 18 (SUTURE) IMPLANT
SUT MON AB 5-0 PS2 18 (SUTURE) ×2 IMPLANT
SUT PDS 3-0 CT2 (SUTURE)
SUT PDS AB 2-0 CT2 27 (SUTURE) ×4 IMPLANT
SUT PDS II 3-0 CT2 27 ABS (SUTURE) IMPLANT
SUT SILK 0 TIES 10X30 (SUTURE) ×2 IMPLANT
SUT SILK 2 0 SH (SUTURE) IMPLANT
SUT VIC AB 3-0 SH 27 (SUTURE) ×2
SUT VIC AB 3-0 SH 27X BRD (SUTURE) ×2 IMPLANT
SUT VICRYL 3-0 CR8 SH (SUTURE) ×4 IMPLANT
SUT VICRYL 4-0 PS2 18IN ABS (SUTURE) ×10 IMPLANT
SUT VICRYL AB 2 0 TIE (SUTURE) ×1 IMPLANT
SUT VICRYL AB 2 0 TIES (SUTURE) ×1
SYR 50ML LL SCALE MARK (SYRINGE) ×8 IMPLANT
SYR 5ML LL (SYRINGE) ×2 IMPLANT
SYR BULB IRRIGATION 50ML (SYRINGE) ×2 IMPLANT
SYR CONTROL 10ML LL (SYRINGE) ×4 IMPLANT
TOWEL OR 17X24 6PK STRL BLUE (TOWEL DISPOSABLE) ×6 IMPLANT
TOWEL OR NON WOVEN STRL DISP B (DISPOSABLE) ×2 IMPLANT
TUBE CONNECTING 20X1/4 (TUBING) ×4 IMPLANT
UNDERPAD 30X30 INCONTINENT (UNDERPADS AND DIAPERS) ×6 IMPLANT
YANKAUER SUCT BULB TIP NO VENT (SUCTIONS) ×4 IMPLANT

## 2012-06-20 NOTE — Brief Op Note (Signed)
06/20/2012  11:37 AM  PATIENT:  Kayla Mcgrath  67 y.o. female  PRE-OPERATIVE DIAGNOSIS:  right breast cancer   POST-OPERATIVE DIAGNOSIS:  right breast cancer   PROCEDURE:  Procedure(s): right skin sparing MASTECTOMY WITH SENTINEL LYMPH NODE BIOPSY (Right) BREAST RECONSTRUCTION WITH PLACEMENT OF TISSUE EXPANDER AND FLEX HD (ACELLULAR HYDRATED DERMIS) (Right)  SURGEON:  Surgeon(s) and Role: Panel 1:    * Almond Lint, MD - Primary    * Valarie Merino, MD - Assisting  Panel 2:    * Lutisha Knoche Sanger, DO - Primary  PHYSICIAN ASSISTANT: Shawn Rayburn  ASSISTANTS: none   ANESTHESIA:   general  EBL:  Total I/O In: 2000 [I.V.:2000] Out: 600 [Urine:350; Blood:250]  BLOOD ADMINISTERED:none  DRAINS: (a) Jackson-Pratt drain(s) with closed bulb suction in the right breast pocket   LOCAL MEDICATIONS USED:  NONE  SPECIMEN:  No Specimen  DISPOSITION OF SPECIMEN:  N/A  COUNTS:  YES  TOURNIQUET:  * No tourniquets in log *  DICTATION: note in EPIC  PLAN OF CARE: Admit for overnight observation  PATIENT DISPOSITION:  PACU - hemodynamically stable.   Delay start of Pharmacological VTE agent (>24hrs) due to surgical blood loss or risk of bleeding: no

## 2012-06-20 NOTE — Anesthesia Procedure Notes (Signed)
Procedure Name: Intubation Date/Time: 06/20/2012 8:48 AM Performed by: Zenia Resides D Pre-anesthesia Checklist: Patient identified, Emergency Drugs available, Suction available and Patient being monitored Patient Re-evaluated:Patient Re-evaluated prior to inductionOxygen Delivery Method: Circle System Utilized Preoxygenation: Pre-oxygenation with 100% oxygen Intubation Type: IV induction Ventilation: Mask ventilation without difficulty Laryngoscope Size: Mac and 3 Grade View: Grade I Tube type: Oral Number of attempts: 1 Airway Equipment and Method: stylet and oral airway Placement Confirmation: ETT inserted through vocal cords under direct vision,  positive ETCO2 and breath sounds checked- equal and bilateral Secured at: 22 cm Tube secured with: Tape Dental Injury: Teeth and Oropharynx as per pre-operative assessment

## 2012-06-20 NOTE — Op Note (Signed)
Op report expander placement   SURGICAL DIVISION: Plastic Surgery  PREOPERATIVE DIAGNOSES:  1. Right breast cancer.   POSTOPERATIVE DIAGNOSES:  1. Right breast cancer.    PROCEDURE:  1. Immediate Right breast reconstruction with placement of tissue expander and Flex HD.  SURGEON: [employer], DO  ASSISTANT:   ANESTHESIA:  General.   COMPLICATIONS: None.   IMPLANTS: Right - Mentor CPX 4 with Suture Tabs, Medium Height 350 cc. Ref #[id number].  Serial Number [id number], 150 cc of injectable saline was placed. Flex HD 4 x 16 cm  INDICATIONS FOR PROCEDURE:  The patient has right breast cancer. She presents for mastectomy with placement of her expander and FlexHD.    CONSENT:  Informed consent was obtained directly from the patient. Risks, benefits and alternatives were fully discussed. Specific risks including but not limited to bleeding, infection, hematoma, seroma, scarring, pain, implant infection, implant extrusion, capsular contracture, asymmetry, wound healing problems, and need for further surgery were all discussed. The patient did have an ample opportunity to have her questions answered to her satisfaction.   DESCRIPTION OF PROCEDURE:  The patient was taken to the operating room. SCDs were placed and IV antibiotics were given. The patient's chest was prepped and draped in a sterile fashion. A time out was performed and all information confirmed to be correct.  General surgery performed a right mastectomy and sentinel node biopsy. The patient was then rendered to the plastic surgery team.  Another time out was called and all information was confirmed to be correct.  The pectoralis major muscle was lifted with the bovie to create a space for the expander.  The expander was prepared according to the manufacture guidelines with the air evacuated. The pocket was irrigated with saline and antibiotic saline.  The Flex HD was prepared according to the manufacture guidelines with pie  crusting to enable fluid evacuation.  The FlexHD was sutured to the pectoralis muscle at the inferior lateral edge and the inframammary fold.  The expander was placed under the pectoralis and Flex HD and the lateral aspect of the FlexHD was sutured to the chest wall.  The expander was infused with 150 cc of injectable saline.  The drain was placed and secured with a 3-0 prolene to the chest wall.  The deep layers were closed with 3-0 vicryl followed by 4-0 vicryl and the skin with 5-0 monocryl.  Hemostasis was ensured. Dermabond and a sterile dressings were applied.  A breast binder was placed. The patient was allowed to wake up, extubated and taken to the recovery room in satisfactory condition.  There were no complications.

## 2012-06-20 NOTE — Transfer of Care (Signed)
Immediate Anesthesia Transfer of Care Note  Patient: Kayla Mcgrath  Procedure(s) Performed: Procedure(s): right skin sparing MASTECTOMY WITH SENTINEL LYMPH NODE BIOPSY (Right) BREAST RECONSTRUCTION WITH PLACEMENT OF TISSUE EXPANDER AND FLEX HD (ACELLULAR HYDRATED DERMIS) (Right)  Patient Location: PACU  Anesthesia Type:General  Level of Consciousness: awake, alert  and oriented  Airway & Oxygen Therapy: Patient Spontanous Breathing and Patient connected to face mask oxygen  Post-op Assessment: Report given to PACU RN and Post -op Vital signs reviewed and stable  Post vital signs: Reviewed and stable  Complications: No apparent anesthesia complications

## 2012-06-20 NOTE — Interval H&P Note (Signed)
History and Physical Interval Note:  06/20/2012 8:22 AM  Kayla Mcgrath  has presented today for surgery, with the diagnosis of right breast cancer   The various methods of treatment have been discussed with the patient and family. After consideration of risks, benefits and other options for treatment, the patient has consented to  Procedure(s): right skin sparing MASTECTOMY WITH SENTINEL LYMPH NODE BIOPSY (Right) BREAST RECONSTRUCTION WITH PLACEMENT OF TISSUE EXPANDER AND FLEX HD (ACELLULAR HYDRATED DERMIS) (Right) as a surgical intervention .  The patient's history has been reviewed, patient examined, no change in status, stable for surgery.  I have reviewed the patient's chart and labs.  Questions were answered to the patient's satisfaction.     SANGER,Towana Stenglein

## 2012-06-20 NOTE — Anesthesia Preprocedure Evaluation (Signed)
Anesthesia Evaluation  Patient identified by MRN, date of birth, ID band Patient awake    Reviewed: Allergy & Precautions, H&P , NPO status , Patient's Chart, lab work & pertinent test results, reviewed documented beta blocker date and time   Airway Mallampati: II TM Distance: >3 FB Neck ROM: full    Dental   Pulmonary neg pulmonary ROS,  breath sounds clear to auscultation        Cardiovascular negative cardio ROS  Rhythm:regular     Neuro/Psych PSYCHIATRIC DISORDERS negative neurological ROS     GI/Hepatic negative GI ROS, Neg liver ROS,   Endo/Other  negative endocrine ROS  Renal/GU negative Renal ROS  negative genitourinary   Musculoskeletal   Abdominal   Peds  Hematology negative hematology ROS (+)   Anesthesia Other Findings See surgeon's H&P   Reproductive/Obstetrics negative OB ROS                           Anesthesia Physical Anesthesia Plan  ASA: II  Anesthesia Plan: General   Post-op Pain Management:    Induction: Intravenous  Airway Management Planned: Oral ETT  Additional Equipment:   Intra-op Plan:   Post-operative Plan: Extubation in OR  Informed Consent: I have reviewed the patients History and Physical, chart, labs and discussed the procedure including the risks, benefits and alternatives for the proposed anesthesia with the patient or authorized representative who has indicated his/her understanding and acceptance.   Dental Advisory Given  Plan Discussed with: CRNA and Surgeon  Anesthesia Plan Comments:         Anesthesia Quick Evaluation  

## 2012-06-20 NOTE — H&P (View-Only) (Signed)
This document contains confidential information from a [facility] medical record system and may be unauthenticated. Release may be made only with a valid authorization or in accordance with applicable policies of Medical Center or its affiliates. This document must be maintained in a secure manner or discarded/destroyed as required by Medical Center policy or by a confidential means such as shredding.     [name]  06/15/2012 9:15 AM   Office Visit  MRN:  [id number]  Department:  Plastic Surgery  Dept Phone: [phone]  Description: Female DOB: 08/15/1945  Provider: Shawn Montgomery Rayburn, PA-C   Diagnoses    Breast cancer, right    -  Primary   174.9   Vitals - Last Recorded   138/84  76  1.554 m (5' 1.18")           Subjective:    Patient ID: Kayla Mcgrath is a 67 y.o. female.  HPI The patient is a 67 yrs old wf here for history and physical for breast reconstruction.   History:  She has a new diagnosis of right breast invasive ductal carcinoma with ER/PR positive, HER-2 negative pathology.  She had lumpectomies in the past for right DCIS 2010. She is not planning on chemo or radiation and no genetic testing.  She has been on hormone replacement for many years.  She has two kids.  She has not had any other surgery, just breast.  She is 5 feet 1 inch tall, weighs 175 pounds and wears a 36 DD bra. She would like to be around a C cup.  She has decided on a right mastectomy. She does not have any DM or HTN.  She has significant breast ptosis and will need the excess skin excised for shaping the breast at the time of the surgery.  No lymphadenopathy palpated.   The following portions of the patient's history were reviewed and updated as appropriate: allergies, current medications, past family history, past medical history, past social history, past surgical history and problem list.  Review of Systems  Constitutional: Negative.   HENT: Negative.   Eyes: Negative.    Respiratory: Negative.   Cardiovascular: Negative.   Gastrointestinal: Negative.   Endocrine: Negative.   Genitourinary: Negative.   Allergic/Immunologic: Negative.   Neurological: Negative.   Hematological: Negative.   Psychiatric/Behavioral: Negative.       Objective:    Physical Exam  Constitutional: She is oriented to person, place, and time. She appears well-developed and well-nourished. No distress.  HENT:   Head: Normocephalic and atraumatic.   Nose: Nose normal.   Mouth/Throat: Oropharynx is clear and moist.  Eyes: Conjunctivae and EOM are normal. Pupils are equal, round, and reactive to light.  Neck: Normal range of motion. Neck supple. No tracheal deviation present.  Cardiovascular: Normal rate, regular rhythm and intact distal pulses.   Slightly diminished pulses in lower extremities and some mild edema  Pulmonary/Chest: Effort normal. No stridor. No respiratory distress.  Abdominal: Soft. She exhibits no distension and no mass. There is no tenderness.  Musculoskeletal: Normal range of motion.  Neurological: She is alert and oriented to person, place, and time.  Skin: Skin is warm and dry.  Psychiatric: She has a normal mood and affect. Her behavior is normal. Judgment and thought content normal.      Assessment:   1.  Breast cancer, right     Plan:   Right immediate breast reconstruction with Expander/FlexHD followed by left breast mastopexy/reduction at the time of   the second procedure for symmetry. The procedure possible risks, benefits and complications were discussed with the patient and she desires to proceed and consent was obtained.      Medications Ordered This Encounter     HYDROcodone-acetaminophen (NORCO) 5-325 mg per Take 1 tablet by mouth every 6 (six) hours as needed for 10 days for Pain.     diazepam (VALIUM) 2 MG tablet Take 1 tablet (2 mg total) by mouth every 6 (six) hours as needed for 10 days for Anxiety.    promethazine (PHENERGAN) 12.5 MG  tablet Take 1 tablet (12.5 mg total) by mouth every 6 (six) hours as needed for 7 days for Nausea.    cephalexin (KEFLEX) 500 MG capsule Take 1 capsule (500 mg total) by mouth 4 times daily.       

## 2012-06-20 NOTE — Op Note (Signed)
Right Mastectomy with Sentinal Node Biopsy Procedure Note  Indications: This patient presents with history of Right multicentric breast cancer with clinically negative axillary lymph node exam.  Pre-operative Diagnosis: right breast cancer, [id number]   Post-operative Diagnosis: right breast cancer, same  Surgeon: Almond Lint   Assist:  Daphine Deutscher, MATTHEW  Anesthesia: General endotracheal anesthesia   ASA Class: 2  Procedure Details  The patient was seen in the Holding Room. The risks, benefits, complications, treatment options, and expected outcomes were discussed with the patient. The possibilities of reaction to medication, pulmonary aspiration, bleeding, infection, the need for additional procedures, failure to diagnose a condition, and creating a complication requiring transfusion or operation were discussed with the patient. The patient concurred with the proposed plan, giving informed consent.  The patient was marked by Dr. Kelly Splinter regarding desired incision.  The site of surgery properly noted/marked. The patient was taken to Operating Room # 8, identified as [facility] and the procedure verified as Right Mastectomy and Sentinal Node Biopsy. A Time Out was held and the above information confirmed.  The methylene blue was injected in the subareolar location.    The right arm, breast, and chest were prepped and draped in standard fashion.    The superior incision was made with the #10 blade.  Saline was infiltrated into the superior flap.  Mastectomy hooks were used to provide elevation of the skin edges, and the curved Mayo scissors were used to create the mastectomy flaps.  The dissection was taken to the fascia of the pectoralis major.  The penetrating vessels were clipped.  The superior flap was taken medially to the lateral sternal border, superiorly to the inferior border of the clavicle.  The inferior flap was similarly created, and taken inferiorly to the inframammary fold and  laterally to the border of the latissimus.  The breast was taken off including the pectoralis fascia and the axillary tail marked.    Using a hand-held gamma probe, axillary sentinel nodes were identified.  2 level 2 axillary sentinel nodes were removed and submitted to pathology.  The findings are below.  The lymphovascular channels were clipped with metal clips.        The wound was irrigated.  Hemostasis was achieved with cautery. Dr. Kelly Splinter arrived at that point to take over the reconstruction aspect of the surgery.   Counts were correct prior to my departure from the case.counts were correct.  Findings: grossly clear surgical margins, SLN #1 hot and blue, cps 1100; SLN #2 hot, cps 1400  Estimated Blood Loss:  less than 50 mL                 Specimens: R breast and 2 axillary sentinel nodes         Complications:  None; patient tolerated the procedure well.         Disposition: PACU - hemodynamically stable.         Condition: stable

## 2012-06-20 NOTE — Anesthesia Postprocedure Evaluation (Signed)
Anesthesia Post Note  Patient: Kayla Mcgrath  Procedure(s) Performed: Procedure(s) (LRB): right skin sparing MASTECTOMY WITH SENTINEL LYMPH NODE BIOPSY (Right) BREAST RECONSTRUCTION WITH PLACEMENT OF TISSUE EXPANDER AND FLEX HD (ACELLULAR HYDRATED DERMIS) (Right)  Anesthesia type: General  Patient location: PACU  Post pain: Pain level controlled  Post assessment: Patient's Cardiovascular Status Stable  Last Vitals:  Filed Vitals:   06/20/12 1330  BP:   Pulse:   Temp: 36.4 C  Resp: 16    Post vital signs: Reviewed and stable  Level of consciousness: alert  Complications: No apparent anesthesia complications

## 2012-06-20 NOTE — H&P (Signed)
Progress Notes    Chief Complaint   Patient presents with   .  Breast Cancer    HISTORY: Patient is a 67 year old female who presents with a  right breast mass detected on followup mammograms.. She has a history of DCIS in 2010 and that was detected when she had a major duct excision for nipple discharge.  For reasons that are not clear to me, she did not undergo radiation. She took tamoxifen for a while but stopped because she is only taking medication. She now has 2 areas of concern seen on her mammogram. There is a area at 2:00 that was 7 mm and an area 9:00 that was 2 mm. These haven't been biopsied. The 2:00 lesion showed invasive ductal carcinoma with ER, PR receptors positive and HER-2 not overexpressed. The Ki-67 was 35%. The other lesion at 9:00 with atypical ductal hyperplasia.  She has no family history of cancer. Menarche age 41. She is no longer having periods and has not had periods for around 16 years. She has not used hormone replacement but did use some hormonal contraception. She had 2 children her first at age 82. She's been married for 46 years. She has had a colonoscopy, bone density scan and a Pap smear.    Past Medical History   Diagnosis  Date   .  DCIS (ductal carcinoma in situ) of breast  2010   .  Hyperlipidemia     .  Breast cancer     .  Skin cancer     .  Joint pain      Past Surgical History   Procedure  Laterality  Date   .  Right lumpectomy    december 2010     Current Outpatient Prescriptions   MedicationsLong-Term  Outpatient Medications Hospital Medications   ALPRAZolam (XANAX) 0.25 MG tablet  Cholecalciferol (VITAMIN D) 2000 UNITS CAPS     Allergies   Allergen  Reactions   .  Latex  Rash       Family History   Problem  Relation  Age of Onset   .  Alcohol abuse  Mother     .  Alcohol abuse  Father     .  Mental illness  Father      History   Social History   .  Marital Status:  Married       Spouse Name:  N/A       Number of Children:   N/A   .  Years of Education:  N/A       Social History Main Topics   .  Smoking status:  Former Games developer   .  Smokeless tobacco:  Not on file   .  Alcohol Use:  No   .  Drug Use:  No   .  Sexually Active:  Not on file       Social History Narrative     Recently moved from [place] to be closer to her two daughters and grandchildren.     Retired Public house manager.     REVIEW OF SYSTEMS - PERTINENT POSITIVES ONLY: 12 point review of systems negative other than HPI and PMH except for history of kidney infections, glasses, dentures, joint pain.     EXAM: Wt Readings from Last 3 Encounters:  06/20/12 176 lb (79.833 kg)  06/20/12 176 lb (79.833 kg)  05/07/12 175 lb (79.379 kg)   Temp Readings from Last 3 Encounters:  06/20/12 98.4 F (36.9 C) Oral  06/20/12  98.4 F (36.9 C) Oral  05/07/12 98 F (36.7 C)    BP Readings from Last 3 Encounters:  06/20/12 137/76  06/20/12 137/76  05/07/12 100/60   Pulse Readings from Last 3 Encounters:  06/20/12 92  06/20/12 92  05/07/12 84      Gen:  No acute distress.  Well nourished and well groomed.   Neurological: Alert and oriented to person, place, and time. Coordination normal.  Head: Normocephalic and atraumatic.   Eyes: Conjunctivae are normal. Pupils are equal, round, and reactive to light. No scleral icterus.  Neck: Normal range of motion. Neck supple. No tracheal deviation or thyromegaly present.  Cardiovascular: Normal rate, regular rhythm, normal heart sounds and intact distal pulses.  Exam reveals no gallop and no friction rub.  No murmur heard. Respiratory: Effort normal.  No respiratory distress. No chest wall tenderness. Breath sounds normal.  No wheezes, rales or rhonchi.   Breast:  Ptotic breasts bilaterally.  No palpable masses, skin dimpling, nipple discharge, nipple retraction.  There is no axillary lymphadenopathy. GI: Soft. Bowel sounds are normal. The abdomen is soft and nontender.  There is no rebound and no guarding.    Musculoskeletal: Normal range of motion. Extremities are nontender.  Lymphadenopathy: No cervical, preauricular, postauricular or axillary adenopathy is present Skin: Skin is warm and dry. No rash noted. No diaphoresis. No erythema. No pallor. No clubbing, cyanosis, or edema.   Psychiatric: Normal mood and affect. Behavior is normal. Judgment and thought content normal.      LABORATORY RESULTS: Available labs are reviewed   CBC, CMET normal other than sl elevated calcium   RADIOLOGY RESULTS: See E-Chart or I-Site for most recent results.  Images and reports are reviewed. Mammogram 2 o'clock 0.7 cm mass calcs at 9 o'clock 0.2 cm near lumpectomy     ASSESSMENT AND PLAN: Cancer of upper-inner quadrant of female breast Patient has a new diagnosis of right breast cancer. This is an in breast recurrence from her prior DCIS. She also has an area of ADH. She was interested in breast conservation. The second area of biopsy was DCIS, so we will proceed with mastectomy.  She is interested postoperatively in seeking reduction on the opposite side. She is having her MRI next week. Once this result is available, we will schedule her surgery otherwise she doesn't need additional MRI guided biopsies.   The surgical procedure was described to the patient.  I discussed the incision type and location and that we would need radiology involved on the day of surgery with sentinel node biopsy.       The risks and benefits of the procedure were described to the patient and he/she wishes to proceed.     We discussed the risks bleeding, infection, damage to other structures, need for further procedures/surgeries.  We discussed the risk of seroma.  The patient was advised that these are the most common complications, but that others can occur as well.  They were advised against taking aspirin or other anti-inflammatory agents/blood thinners the week before surgery.     Maudry Diego MD Surgical  Oncology, General and Endocrine Surgery [facility]     Visit Diagnoses: 1.  Cancer of upper-inner quadrant of female breast, right      Primary Care Physician: Ruthe Mannan, MD   Michell Heinrich [facility]

## 2012-06-22 ENCOUNTER — Encounter (HOSPITAL_BASED_OUTPATIENT_CLINIC_OR_DEPARTMENT_OTHER): Payer: Self-pay | Admitting: General Surgery

## 2012-06-28 ENCOUNTER — Encounter: Payer: Self-pay | Admitting: *Deleted

## 2012-06-29 ENCOUNTER — Telehealth (INDEPENDENT_AMBULATORY_CARE_PROVIDER_SITE_OTHER): Payer: Self-pay

## 2012-06-29 ENCOUNTER — Telehealth: Payer: Self-pay | Admitting: Oncology

## 2012-06-29 ENCOUNTER — Telehealth: Payer: Self-pay | Admitting: *Deleted

## 2012-06-29 NOTE — Telephone Encounter (Signed)
Pt given pathology results.  She says she is doing fine, and will see Dr. Kelly Splinter next week to have her drain removed.

## 2012-06-29 NOTE — Telephone Encounter (Signed)
Spoke to pt concerning reasoning for r/s f/u appt to 08/01/12.  Received verbal understanding.  Confirmed new appt date and time for 08/01/12 at 2:30 for labs and 3:00 for Dr. Darnelle Catalan.  Pt denies further needs at this time.

## 2012-06-29 NOTE — Telephone Encounter (Signed)
, °

## 2012-07-09 ENCOUNTER — Encounter: Payer: Self-pay | Admitting: *Deleted

## 2012-07-09 NOTE — Progress Notes (Signed)
Ordered Oncotype Dx test w/ [facility].  Faxed request to [facility].  Called Tammy in [facility] and verified that she received it.

## 2012-07-10 ENCOUNTER — Ambulatory Visit: Payer: Medicare HMO | Admitting: Oncology

## 2012-07-10 ENCOUNTER — Other Ambulatory Visit: Payer: Medicare HMO | Admitting: Lab

## 2012-07-20 ENCOUNTER — Encounter: Payer: Self-pay | Admitting: *Deleted

## 2012-07-20 NOTE — Progress Notes (Signed)
Received Oncotype Dx results of 11.  Gave copy to MD.  Emailed MD w/ results.  Took copy to [facility] to scan.

## 2012-08-01 ENCOUNTER — Ambulatory Visit: Payer: Medicare HMO | Admitting: Oncology

## 2012-08-01 ENCOUNTER — Other Ambulatory Visit (HOSPITAL_BASED_OUTPATIENT_CLINIC_OR_DEPARTMENT_OTHER): Payer: Medicare HMO | Admitting: Lab

## 2012-08-01 ENCOUNTER — Other Ambulatory Visit: Payer: Medicare HMO | Admitting: Lab

## 2012-08-01 ENCOUNTER — Ambulatory Visit (HOSPITAL_BASED_OUTPATIENT_CLINIC_OR_DEPARTMENT_OTHER): Payer: Medicare HMO | Admitting: Oncology

## 2012-08-01 ENCOUNTER — Telehealth: Payer: Self-pay | Admitting: *Deleted

## 2012-08-01 VITALS — BP 129/78 | HR 86 | Temp 98.6°F | Resp 20 | Ht 60.0 in | Wt 176.6 lb

## 2012-08-01 DIAGNOSIS — C50219 Malignant neoplasm of upper-inner quadrant of unspecified female breast: Secondary | ICD-10-CM

## 2012-08-01 DIAGNOSIS — E559 Vitamin D deficiency, unspecified: Secondary | ICD-10-CM

## 2012-08-01 DIAGNOSIS — C50211 Malignant neoplasm of upper-inner quadrant of right female breast: Secondary | ICD-10-CM

## 2012-08-01 LAB — CBC WITH DIFFERENTIAL/PLATELET
Basophils Absolute: 0.1 10*3/uL (ref 0.0–0.1)
Eosinophils Absolute: 0.2 10*3/uL (ref 0.0–0.5)
HGB: 13.5 g/dL (ref 11.6–15.9)
LYMPH%: 28.7 % (ref 14.0–49.7)
MCV: 91.3 fL (ref 79.5–101.0)
MONO%: 8.4 % (ref 0.0–14.0)
NEUT#: 3.4 10*3/uL (ref 1.5–6.5)
Platelets: 221 10*3/uL (ref 145–400)

## 2012-08-01 LAB — COMPREHENSIVE METABOLIC PANEL (CC13)
Albumin: 3.6 g/dL (ref 3.5–5.0)
Alkaline Phosphatase: 97 U/L (ref 40–150)
BUN: 18.3 mg/dL (ref 7.0–26.0)
Glucose: 99 mg/dl (ref 70–140)
Potassium: 4 mEq/L (ref 3.5–5.1)
Total Bilirubin: 0.27 mg/dL (ref 0.20–1.20)

## 2012-08-01 MED ORDER — ANASTROZOLE 1 MG PO TABS: 1.0000 mg | ORAL_TABLET | Freq: Every day | ORAL | Status: DC

## 2012-08-01 NOTE — Progress Notes (Signed)
IDMakaylin Mcgrath   DOB: April 03, 1945  MR#: [id number]  LKG#:[id number]  PCP: Kayla Mannan, MD GYN:  Kayla Mcgrath OTHER MD: Kayla Mcgrath; Kayla Mcgrath (FAX [phone]), Kayla Mcgrath   HISTORY OF PRESENT ILLNESS: Kayla Mcgrath underwent right lumpectomy in December of 2010 for a 1.2 cm ductal carcinoma in situ, intermediate grade, with close but negative margins (1 mm). I do not have the prognostic panel, but per patient and history the tumor was estrogen receptor positive and Kayla Mcgrath took tamoxifen for approximately one year. She declined radiation.  She was then followed closely, with mammography every 6 months. On 03/08/2012 a new area of amorphous calcifications measuring 2 mm were noted at the 9:00 position of the right breast. There was also an isodense oval mass measuring 7 mm at the 2:00 position in the same breast. Ultrasound of the right breast confirmed a 6 mm oval hypoechoic mass with microlobulated margins.  The patient was referred to the breast Center for biopsy, and on for 15 2014 underwent biopsy of the small area of calcifications in the right breast as well as the breast mass in the same breast. These showed ([id number]) invasive ductal carcinoma, grade 2 or 3, estrogen receptor 100% positive, progesterone receptor 10% positive, with an MIB-1 of 35%, and no HER-2 amplification. The small area of calcification showed only atypical ductal hyperplasia.  The patient's subsequent history is as detailed below.  INTERVAL HISTORY: Kayla Mcgrath returns today for followup of her breast cancer accompanied by her husband Kayla Mcgrath. Since her last visit here she had a right mastectomy with sentinel lymph node sampling showing a 9 mm invasive ductal carcinoma, grade 2, with negative lymph nodes, negative margins, and negative repeat HER-2  REVIEW OF SYSTEMS: She did well with her surgery, with no unusual pain, fever, or bleeding. She does have stabbing pains in the right breast area at times; these are  brief. She is undergoing reconstruction, so far with no complications. There is a little "dog ear" of flash medially along the scar which will need to be resected eventually, she tells me. A detailed review of systems today was otherwise noncontributory  PAST MEDICAL HISTORY: Past Medical History  Diagnosis Date   DCIS (ductal carcinoma in situ) of breast 2010   Hyperlipidemia    Breast cancer    Skin cancer    Joint pain    Wears glasses    Full dentures     PAST SURGICAL HISTORY: Past Surgical History  Procedure Laterality Date   Right lumpectomy  december 2010    cancer-tamoxifen   Dilation and curettage of uterus     Colonoscopy     Mastectomy w/ sentinel node biopsy Right 06/20/2012    Procedure: right skin sparing MASTECTOMY WITH SENTINEL LYMPH NODE BIOPSY;  Surgeon: Almond Lint, MD;  Location: [facility];  Service: General;  Laterality: Right;   Breast reconstruction with placement of tissue expander and flex hd (acellular hydrated dermis) Right 06/20/2012    Procedure: BREAST RECONSTRUCTION WITH PLACEMENT OF TISSUE EXPANDER AND FLEX HD (ACELLULAR HYDRATED DERMIS);  Surgeon: Kayla Denis, DO;  Location:  SURGERY CENTER;  Service: Plastics;  Laterality: Right;    FAMILY HISTORY Family History  Problem Relation Age of Onset   Alcohol abuse Mother    Alcohol abuse Father    Mental illness Father    the patient's father committed suicide at the age of 29. The patient's mother died at the age of 29 in the setting of alcohol  abuse. The patient has one brother, 2 sisters. There is no history of breast or ovarian cancer in the family to the patient's knowledge.  GYNECOLOGIC HISTORY: Menarche age 65, first live birth age 77, the patient is GX P2. She went through menopause approximately 1998. She did not take hormone replacement.  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 Her husband Kayla Mcgrath, currently retired, used to Optometrist. Daughter Kayla Mcgrath lives in [place] and works as an Charity fundraiser for [employer] and also at [employer] in [place]. Daughter Kayla Mcgrath l is a homemaker in [place]. The patient has 6 grandchildren and one on the way. She is a International aid/development worker.   ADVANCED DIRECTIVES: Not in place  HEALTH MAINTENANCE: History  Substance Use Topics   Smoking status: Former Smoker    Quit date: 06/14/1981   Smokeless tobacco: Not on file   Alcohol Use: No     Colonoscopy: 2009  PAP: 2012  Bone density: 2012  Lipid panel:  Allergies  Allergen Reactions   Latex Rash    Current Outpatient Prescriptions  Medication Sig Dispense Refill   ALPRAZolam (XANAX) 0.25 MG tablet Take 1 tablet (0.25 mg total) by mouth 2 (two) times daily as needed for sleep or anxiety.  30 tablet  0   Cholecalciferol (VITAMIN D) 2000 UNITS CAPS Take one by mouth daily       No current facility-administered medications for this visit.    OBJECTIVE: Middle-aged white woman recovering well from her recent surgery Filed Vitals:   08/01/12 1448  BP: 129/78  Pulse: 86  Temp: 98.6 F (37 C)  Resp: 20     Body mass index is 34.49 kg/(m^2).    ECOG FS: 1  Sclerae unicteric Oropharynx clear No cervical or supraclavicular adenopathy Lungs no rales or rhonchi Heart regular rate and rhythm Abd soft nontender positive bowel sounds MSK  no focal spinal tenderness, no peripheral edema Neuro: nonfocal, well oriented, positive affect Breasts: The right breast is status post mastectomy and sentinel lymph node sampling. There is an expander in place. The scar is very irregular, but there is no dehiscence or inflammation. The right axilla is benign. The left breast is unremarkable   LAB RESULTS: Lab Results  Component Value Date   WBC 5.9 08/01/2012   NEUTROABS 3.4 08/01/2012   HGB 13.5 08/01/2012   HCT 39.8 08/01/2012   MCV 91.3 08/01/2012   PLT 221 08/01/2012      Chemistry      Component  Value Date/Time   NA 141 08/01/2012 1354   NA 139 04/30/2012 0903   K 4.0 08/01/2012 1354   K 4.5 04/30/2012 0903   CL 105 05/02/2012 0822   CL 104 04/30/2012 0903   CO2 28 08/01/2012 1354   CO2 28 04/30/2012 0903   BUN 18.3 08/01/2012 1354   BUN 16 04/30/2012 0903   CREATININE 0.9 08/01/2012 1354   CREATININE 0.8 04/30/2012 0903      Component Value Date/Time   CALCIUM 10.2 08/01/2012 1354   CALCIUM 10.0 04/30/2012 0903   ALKPHOS 97 08/01/2012 1354   ALKPHOS 84 04/30/2012 0903   AST 24 08/01/2012 1354   AST 28 04/30/2012 0903   ALT 23 08/01/2012 1354   ALT 26 04/30/2012 0903   BILITOT 0.27 08/01/2012 1354   BILITOT 0.8 04/30/2012 0903       No results found for this basename: LABCA2    No components found with this basename: [id number]  No results found for this basename: INR,  in the last 168 hours  Urinalysis No results found for this basename: colorurine,  appearanceur,  labspec,  phurine,  glucoseu,  hgbur,  bilirubinur,  ketonesur,  proteinur,  urobilinogen,  nitrite,  leukocytesur    STUDIES: Patient: ALYCIA, COOPERWOOD Collected: 06/20/2012 Client:[facility] Accession: [id number] Received: 06/20/2012 Almond Mcgrath DOB: 11/09/1945 Age: 83 Gender: F Reported: 06/27/2012 [address] Patient Ph: [phone] MRN #: [id number] [place], [place] [address] Visit #: [id number] Chart #: Phone:  Fax: CC: Faith Rogue, MD Esperanza Heir, MD Caren T. Clifton Custard, MD Almond Lint, MD REPORT OF SURGICAL PATHOLOGY ADDITIONAL INFORMATION: 3. A sample (block 3T) was sent to [facility] for Oncotype testing. The patient's recurrence score is 11. Those patients who had a recurrence score of 11 had an average rate of distant recurrence of 8%. (JBK:gt, 07/20/12) Pecola Leisure MD Pathologist, Electronic Signature ( Signed 07/20/2012) 3. CHROMOGENIC IN-SITU HYBRIDIZATION Results: HER-2/NEU BY CISH - NO AMPLIFICATION OF HER-2 DETECTED. RESULT RATIO OF HER2: CEP 17  SIGNALS 1.39 AVERAGE HER2 COPY NUMBER PER CELL 3.55 REFERENCE RANGE NEGATIVE HER2/Chr17 Ratio <2.0 and Average HER2 copy number <4.0 EQUIVOCAL HER2/Chr17 Ratio <2.0 and Average HER2 copy number 4.0 and <6.0 POSITIVE HER2/Chr17 Ratio >=2.0 and/or Average HER2 copy number >=6.0 Abigail Miyamoto MD Pathologist, Electronic Signature ( Signed 07/03/2012) FINAL DIAGNOSIS Diagnosis 1. Lymph node, sentinel, biopsy, Right axillary - ONE BENIGN LYMPH NODE WITH NO TUMOR SEEN (0/1). 2. Lymph node, sentinel, biopsy, Right axillary 1 of 3 Duplicate copy FINAL for Wehrly, Sibbie ([id number]) Diagnosis(continued) - ONE BENIGN LYMPH NODE WITH NO TUMOR SEEN (0/1). 3. Breast, simple mastectomy, Right - INVASIVE GRADE II DUCTAL CARCINOMA, SPANNING 0.9 CM IN GREATEST DIMENSION. - ASSOCIATED HIGH GRADE DUCTAL CARCINOMA IN SITU. - DEFINITIVE LYMPH/VASCULAR INVASION IS NOT IDENTIFIED. - ATYPICAL LOBULAR HYPERPLASIA (LOBULAR NEOPLASIA) PRESENT. - MARGINS ARE NEGATIVE. - SEE ONCOLOGY TEMPLATE. Microscopic Comment 3. BREAST, INVASIVE TUMOR, WITH LYMPH NODE SAMPLING Specimen, including laterality: Right breast with sentinel lymph node sampling. Procedure: Right simple mastectomy with sentinel lymph node biopsies. Grade: II Tubule formation: 3. Nuclear pleomorphism: 2. Mitotic:1. Tumor size (glass slide measurement): 0.9 cm. Margins: Invasive, distance to closest margin: 0.5 cm (superior lateral deep margin). In-situ, distance to closest margin: At 0.5 cm (superior lateral deep margin). Lymphovascular invasion: There is extensive tumor clefting however, definitive lymph vascular invasion is not identified. Ductal carcinoma in situ: Yes. Grade: High grade. Extensive intraductal component: No. Lobular neoplasia: No. Tumor focality: Unifocal. Treatment effect: N/A. Extent of tumor: Tumor confined to breast parenchyma. Lymph nodes: # examined: 2. Lymph nodes with metastasis: 0. Breast prognostic  profile: Performed on previous case [id number] Estrogen receptor: 100%, positive. Progesterone receptor: 10%, positive. Her 2 neu: 1.14, ratio, not amplified. Ki-67: 35%. Non-neoplastic breast: Fibrocystic changes. TNM: pT1b, pN0, MX. Comments: Both biopsy clip markers are identified grossly. At one of the clips, there is invasive grade II ductal carcinoma associated with high grade ductal carcinoma in situ. At the second clip there is extensive biopsy site change including fibrosis, chronic inflammation and giant cell reaction without residual tumor identified. As no amplification of Her-2 neu by CISH is seen in the original tumor biopsy, Her-2 neu by CISH will be repeated on the invasive tumor and reported in an addendum. (RAH:gt, 06/27/12) Zandra Abts MD  ASSESSMENT: 67 y.o. [place] woman  (1) status post right lumpectomy December of 2010 for ductal carcinoma in situ, intermediate grade, estrogen receptor positive, with  close but negative margins, the patient refusing adjuvant radiation therapy, but taking adjuvant tamoxifen approximately one year  (2) status post right breast upper inner quadrant biopsy 04/26/2012 for a clinical T1b N0, stage IA invasive ductal carcinoma, intermediate grade, estrogen receptor 100% and progesterone receptor 10% positive, with no HER-2 amplification, and an MIB-1 of 35%  (3) status post right mastectomy and sentinel lymph node sampling 06/20/2012 for a pT1b pN0, stage IA invasive ductal carcinoma, grade 2, again HER-2 negative.  (4) Oncotype DX score of 11 her Dick's a distant recurrence within 10 years of 8% if the patient's only adjuvant systemic treatment is tamoxifen for 5 years.   PLAN: We spent the better part of today's hour-long visit discussing her situation in detail. She understands she has had optimal local treatment and will not need radiation. She has an excellent prognosis overall and she can improve on the Oncotype risk of  recurrence if she takes 5 years of an aromatase inhibitor instead of 5 years of tamoxifen, or if she mixes tamoxifen and an aromatase inhibitor, or if she takes tamoxifen for 5 years.  After much discussion of the possible toxicities side effects and complications of tamoxifen and the aromatase inhibitors, she opted for anastrozole, and I went ahead and wrote her that prescription. We're going to obtain a bone density in the near future (she tells me her [place] one [place] about [place], and showed osteopenia). She will then see [place] approximately 3 months from now. If she tolerates the anastrozole well the plan will be to continue that for 5 years, and we will address the bone density issues with bisphosphonates, most likely week last once a year for 5 years.  She will let us know if she has any problems tolerating anastrozole or any other issues before her next visit here.   Liesa Tsan C    08/01/2012

## 2012-08-01 NOTE — Telephone Encounter (Signed)
appts made and printed...td 

## 2012-08-02 ENCOUNTER — Telehealth: Payer: Self-pay

## 2012-08-02 NOTE — Telephone Encounter (Signed)
Pt left v/m requesting copy of bone density report from [place]; could not find report and left v/m notifying pt.

## 2012-08-02 NOTE — Addendum Note (Signed)
Addended by: Billey Co on: 08/02/2012 04:15 PM   Modules accepted: Orders

## 2012-08-07 ENCOUNTER — Other Ambulatory Visit (HOSPITAL_COMMUNITY): Payer: Self-pay | Admitting: Oncology

## 2012-08-23 ENCOUNTER — Telehealth: Payer: Self-pay

## 2012-08-23 NOTE — Telephone Encounter (Signed)
Pt wanted to ck status of records from [place]. Advised pt faxed request 08/06/12 but have not received records yet and pt will ck with doctors office in [place].

## 2012-09-03 ENCOUNTER — Other Ambulatory Visit: Payer: Self-pay | Admitting: Family Medicine

## 2012-10-02 ENCOUNTER — Ambulatory Visit
Admission: RE | Admit: 2012-10-02 | Discharge: 2012-10-02 | Disposition: A | Discharge status: Home / Self Care | Payer: Commercial Managed Care - HMO | Source: Ambulatory Visit | Attending: Oncology | Admitting: Oncology

## 2012-10-02 DIAGNOSIS — E559 Vitamin D deficiency, unspecified: Secondary | ICD-10-CM

## 2012-10-02 DIAGNOSIS — C50219 Malignant neoplasm of upper-inner quadrant of unspecified female breast: Secondary | ICD-10-CM

## 2012-10-04 ENCOUNTER — Encounter (HOSPITAL_BASED_OUTPATIENT_CLINIC_OR_DEPARTMENT_OTHER): Payer: Self-pay | Admitting: *Deleted

## 2012-10-04 ENCOUNTER — Other Ambulatory Visit: Payer: Self-pay | Admitting: Plastic Surgery

## 2012-10-04 DIAGNOSIS — Z9011 Acquired absence of right breast and nipple: Secondary | ICD-10-CM

## 2012-10-04 DIAGNOSIS — N651 Disproportion of reconstructed breast: Secondary | ICD-10-CM

## 2012-10-04 NOTE — Progress Notes (Signed)
No new labs needed-treated for yeast inf under [facility] by dr sanger-getting better

## 2012-10-04 NOTE — H&P (Signed)
Kayla Mcgrath is an 67 y.o. female.   Chief Complaint: Acquired absence of right breast and left breast asymmetry after breast reconstruction. HPI: The patient is a 67 yrs old wf here for history and physical for secondary right breast reconstruction with expander removal and silicone implant placement and left breast reduction for symmetry.  She has a diagnosis of right breast invasive ductal carcinoma with ER/PR positive, HER-2 negative pathology. She had lumpectomies in the past for right DCIS 2010. She is not planning on chemo or radiation and no genetic testing. She has been on hormone replacement for many years. She has two kids. She is 5 feet 1 inch tall, weighs 175 pounds and was a 36 DD bra. She would like to be around a C cup.  She has significant breast ptosis on the left.  She has a dog ear over the right medial breast.    Past Medical History  Diagnosis Date  . DCIS (ductal carcinoma in situ) of breast 2010  . Hyperlipidemia   . Breast cancer   . Skin cancer   . Joint pain   . Wears glasses   . Full dentures     Past Surgical History  Procedure Laterality Date  . Right lumpectomy  december 2010    cancer-tamoxifen  . Dilation and curettage of uterus    . Colonoscopy    . Mastectomy w/ sentinel node biopsy Right 06/20/2012    Procedure: right skin sparing MASTECTOMY WITH SENTINEL LYMPH NODE BIOPSY;  Surgeon: Almond Lint, MD;  Location: [facility];  Service: General;  Laterality: Right;  . Breast reconstruction with placement of tissue expander and flex hd (acellular hydrated dermis) Right 06/20/2012    Procedure: BREAST RECONSTRUCTION WITH PLACEMENT OF TISSUE EXPANDER AND FLEX HD (ACELLULAR HYDRATED DERMIS);  Surgeon: Wayland Denis, DO;  Location: [facility];  Service: Plastics;  Laterality: Right;    Family History  Problem Relation Age of Onset  . Alcohol abuse Mother   . Alcohol abuse Father   . Mental illness Father    Social History:   reports that she quit smoking about 31 years ago. She does not have any smokeless tobacco history on file. She reports that she does not drink alcohol or use illicit drugs.  Allergies:  Allergies  Allergen Reactions  . Latex Rash     (Not in a hospital admission)  No results found for this or any previous visit (from the past 48 hour(s)). Dg Bone Density  10/02/2012   *RADIOLOGY REPORT*  Clinical Data: 67 year old postmenopausal female with history of right breast cancer and vitamin D deficiency.  The patient has taken Arimidex for 1 month.  She takes vitamin D.  DUAL X-RAY ABSORPTIOMETRY (DXA) FOR BONE MINERAL DENSITY  AP LUMBAR SPINE (L3 - L4)  Bone Mineral Density (BMD):            0.857 g/cm2 Young Adult T Score:                          -2.2 Z Score:                                                -0.3  LEFT FEMUR (NECK)  Bone Mineral Density (BMD):  0.705 g/cm2 Young Adult T Score:                           -1.3 Z Score:                                                 0.2  ASSESSMENT:  Patient's diagnostic category is LOW BONE MASS by WHO Criteria.  FRACTURE RISK: MODERATE  FRAX: Based on the World Health Organization FRAX model, the 10 year probability of a major osteoporotic fracture is 8%.  The 10 year probability of a hip fracture is 0.7%.  L1 and L2 are excluded due to a greater than one standard deviation difference in T-score from adjacent vertebral bodies.  Comparison: None.  RECOMMENDATIONS:  All patients should ensure an adequate intake of dietary calcium (1200mg  daily) and vitamin D (800 IU daily) unless contraindicated. The National Osteoporosis Foundation recommends that FDA-approved medical therapies be considered in postmenopausal women and mean age 72 or older with a:  1)    Hip or vertebral (clinical or morphometric) fracture.  2)   T-score of -2.5 or lower at the spine or hip.  3)   Ten-year fracture probability by FRAX of 3% or greater for hip fracture or 20% or  greater for major osteoporotic fracture.  FOLLOW-UP:  People with diagnosed cases of osteoporosis or at high risk for fracture should have regular bone mineral density tests.  For patients eligible for Medicare, routine testing is allowed once every 2 years.  The testing frequency can be increased to one year for patients who have rapidly progressing disease, those who are receiving or discontinuing medical therapy to restore bone mass, or have additional risk factors.   Original Report Authenticated By: Cain Saupe, M.D.    Review of Systems  Constitutional: Negative.   HENT: Negative.   Eyes: Negative.   Respiratory: Negative.   Gastrointestinal: Negative.   Genitourinary: Negative.   Musculoskeletal: Negative.   Skin: Negative.   Neurological: Negative.   Endo/Heme/Allergies: Negative.   Psychiatric/Behavioral: Negative.     There were no vitals taken for this visit. Physical Exam  Constitutional: She appears well-developed and well-nourished.  HENT:  Head: Normocephalic and atraumatic.  Eyes: Conjunctivae and EOM are normal. Pupils are equal, round, and reactive to light.  Neck: Normal range of motion.  Cardiovascular: Normal rate.   Respiratory: Effort normal.    Neurological: She is alert.  Skin: Skin is warm.  Psychiatric: She has a normal mood and affect. Her behavior is normal. Judgment and thought content normal.     Assessment/Plan Right breast reconstruction with removal of expander, placement of silicone implant.  Repair of medial dog ear. Left breast reduction/mastopexy for symmetry.  Risks and complications were reviewed.  SANGER,Kisean Rollo 10/04/2012, 7:33 AM

## 2012-10-04 NOTE — Progress Notes (Signed)
Pt states dr Kelly Splinter told her she would have lipo -her scheduler called to be sure that it is corrected in procedure

## 2012-10-05 ENCOUNTER — Ambulatory Visit (INDEPENDENT_AMBULATORY_CARE_PROVIDER_SITE_OTHER): Payer: Medicare HMO | Admitting: General Surgery

## 2012-10-05 ENCOUNTER — Encounter (INDEPENDENT_AMBULATORY_CARE_PROVIDER_SITE_OTHER): Payer: Self-pay | Admitting: General Surgery

## 2012-10-05 VITALS — BP 119/80 | HR 60 | Temp 98.6°F | Resp 14 | Ht 61.0 in | Wt 178.0 lb

## 2012-10-05 DIAGNOSIS — C50211 Malignant neoplasm of upper-inner quadrant of right female breast: Secondary | ICD-10-CM

## 2012-10-05 DIAGNOSIS — C50219 Malignant neoplasm of upper-inner quadrant of unspecified female breast: Secondary | ICD-10-CM

## 2012-10-05 NOTE — Patient Instructions (Signed)
I will see you back in 6 months and we will set up mammogram in may at that time.

## 2012-10-05 NOTE — Progress Notes (Signed)
HISTORY: Pt is a 67 yo F s/p right mastectomy/sln bx in June 2014.  She had expander placement.  She is doing well overall except that she is having issues "feeling lopsided."  She recently had yeast infection and is on fluconazole.  She is on arimedex for chemoprevention.     PERTINENT REVIEW OF SYSTEMS: Otherwise negative.    Filed Vitals:   10/05/12 1126  BP: 119/80  Pulse: 60  Temp: 98.6 F (37 C)  Resp: 14   Filed Weights   10/05/12 1126  Weight: 178 lb (80.74 kg)     EXAM: Head: Normocephalic and atraumatic.  Eyes:  Conjunctivae are normal. Pupils are equal, round, and reactive to light. No scleral icterus.  Neck:  Normal range of motion. Neck supple. No tracheal deviation present. No thyromegaly present.  Resp: No respiratory distress, normal effort. Breast:  Right breast with expander.  Medial dog ear.  No axillary adenopathy.  No infection.  Left breast without masses or skin dimpling.  No LAD.   Abd:  Abdomen is soft, non distended and non tender. No masses are palpable.  There is no rebound and no guarding.  Neurological: Alert and oriented to person, place, and time. Coordination normal.  Skin: Skin is warm and dry. No rash noted. No diaphoretic. No erythema. No pallor.  Psychiatric: Normal mood and affect. Normal behavior. Judgment and thought content normal.      ASSESSMENT AND PLAN:   Cancer of upper-inner quadrant of female breast No clinical evidence of disease.    Pt due to have expander switch out to implant next week and contralateral reduction.  Follow up in 6 months.    Pt due for mammogram in may 2015.        Maudry Diego, MD Surgical Oncology, General & Endocrine Surgery [facility]  Ruthe Mannan, MD No ref. provider found

## 2012-10-05 NOTE — Assessment & Plan Note (Signed)
No clinical evidence of disease.    Pt due to have expander switch out to implant next week and contralateral reduction.  Follow up in 6 months.    Pt due for mammogram in may 2015.

## 2012-10-10 HISTORY — PX: REDUCTION MAMMAPLASTY: SUR839

## 2012-10-11 ENCOUNTER — Ambulatory Visit (HOSPITAL_BASED_OUTPATIENT_CLINIC_OR_DEPARTMENT_OTHER)
Admission: RE | Admit: 2012-10-11 | Discharge: 2012-10-12 | Disposition: A | Discharge status: Home / Self Care | Payer: Medicare HMO | Source: Ambulatory Visit | Attending: Plastic Surgery | Admitting: Plastic Surgery

## 2012-10-11 ENCOUNTER — Encounter (HOSPITAL_BASED_OUTPATIENT_CLINIC_OR_DEPARTMENT_OTHER)
Admission: RE | Disposition: A | Discharge status: Home / Self Care | Payer: Self-pay | Source: Ambulatory Visit | Attending: Plastic Surgery

## 2012-10-11 ENCOUNTER — Ambulatory Visit (HOSPITAL_BASED_OUTPATIENT_CLINIC_OR_DEPARTMENT_OTHER): Payer: Medicare HMO | Admitting: Anesthesiology

## 2012-10-11 ENCOUNTER — Encounter (HOSPITAL_BASED_OUTPATIENT_CLINIC_OR_DEPARTMENT_OTHER): Payer: Self-pay | Admitting: *Deleted

## 2012-10-11 ENCOUNTER — Encounter (HOSPITAL_BASED_OUTPATIENT_CLINIC_OR_DEPARTMENT_OTHER): Payer: Self-pay | Admitting: Anesthesiology

## 2012-10-11 DIAGNOSIS — Z170 Estrogen receptor positive status [ER+]: Secondary | ICD-10-CM | POA: Insufficient documentation

## 2012-10-11 DIAGNOSIS — E785 Hyperlipidemia, unspecified: Secondary | ICD-10-CM | POA: Insufficient documentation

## 2012-10-11 DIAGNOSIS — Z85828 Personal history of other malignant neoplasm of skin: Secondary | ICD-10-CM | POA: Insufficient documentation

## 2012-10-11 DIAGNOSIS — Z9011 Acquired absence of right breast and nipple: Secondary | ICD-10-CM

## 2012-10-11 DIAGNOSIS — Z9010 Acquired absence of unspecified breast and nipple: Secondary | ICD-10-CM | POA: Insufficient documentation

## 2012-10-11 DIAGNOSIS — Z79890 Hormone replacement therapy (postmenopausal): Secondary | ICD-10-CM | POA: Insufficient documentation

## 2012-10-11 DIAGNOSIS — Z853 Personal history of malignant neoplasm of breast: Secondary | ICD-10-CM | POA: Insufficient documentation

## 2012-10-11 DIAGNOSIS — N651 Disproportion of reconstructed breast: Secondary | ICD-10-CM | POA: Diagnosis not present

## 2012-10-11 HISTORY — PX: BREAST REDUCTION WITH MASTOPEXY: SHX6465

## 2012-10-11 HISTORY — PX: LIPOSUCTION WITH LIPOFILLING: SHX6436

## 2012-10-11 HISTORY — PX: REMOVAL OF TISSUE EXPANDER AND PLACEMENT OF IMPLANT: SHX6457

## 2012-10-11 LAB — POCT HEMOGLOBIN-HEMACUE: Hemoglobin: 14.9 g/dL (ref 12.0–15.0)

## 2012-10-11 SURGERY — REMOVAL, TISSUE EXPANDER, BREAST, WITH IMPLANT INSERTION
Anesthesia: General | Site: Breast | Laterality: Right | Wound class: Clean

## 2012-10-11 MED ORDER — OXYCODONE HCL 5 MG/5ML PO SOLN: 5.0000 mg | Freq: Once | ORAL | Status: AC | PRN

## 2012-10-11 MED ORDER — ACETAMINOPHEN 650 MG RE SUPP: 650.0000 mg | Freq: Four times a day (QID) | RECTAL | Status: DC | PRN

## 2012-10-11 MED ORDER — LACTATED RINGERS IV SOLN
INTRAVENOUS | Status: DC
  Administered 2012-10-11 (×2): via INTRAVENOUS

## 2012-10-11 MED ORDER — SODIUM CHLORIDE 0.9 % IV SOLN
3.0000 g | Freq: Four times a day (QID) | INTRAVENOUS | Status: DC
  Administered 2012-10-11 – 2012-10-12 (×3): 3 g via INTRAVENOUS

## 2012-10-11 MED ORDER — MIDAZOLAM HCL 5 MG/5ML IJ SOLN
INTRAMUSCULAR | Status: DC | PRN
  Administered 2012-10-11 (×2): 1 mg via INTRAVENOUS

## 2012-10-11 MED ORDER — MORPHINE SULFATE 2 MG/ML IJ SOLN: 2.0000 mg | INTRAMUSCULAR | Status: DC | PRN

## 2012-10-11 MED ORDER — MIDAZOLAM HCL 2 MG/2ML IJ SOLN: 1.0000 mg | INTRAMUSCULAR | Status: DC | PRN

## 2012-10-11 MED ORDER — PROPOFOL 10 MG/ML IV BOLUS
INTRAVENOUS | Status: DC | PRN
  Administered 2012-10-11: 200 mg via INTRAVENOUS

## 2012-10-11 MED ORDER — SODIUM CHLORIDE 0.9 % IR SOLN
Status: DC | PRN
  Administered 2012-10-11: 09:00:00

## 2012-10-11 MED ORDER — FENTANYL CITRATE 0.05 MG/ML IJ SOLN: 50.0000 ug | INTRAMUSCULAR | Status: DC | PRN

## 2012-10-11 MED ORDER — ACETAMINOPHEN 325 MG PO TABS
650.0000 mg | ORAL_TABLET | Freq: Four times a day (QID) | ORAL | Status: DC | PRN
  Administered 2012-10-12: 650 mg via ORAL

## 2012-10-11 MED ORDER — HYDROMORPHONE HCL PF 1 MG/ML IJ SOLN
0.2500 mg | INTRAMUSCULAR | Status: DC | PRN
  Administered 2012-10-11: 0.25 mg via INTRAVENOUS
  Administered 2012-10-11: 0.5 mg via INTRAVENOUS
  Administered 2012-10-11: 0.25 mg via INTRAVENOUS

## 2012-10-11 MED ORDER — ONDANSETRON HCL 4 MG/2ML IJ SOLN
4.0000 mg | Freq: Once | INTRAMUSCULAR | Status: AC | PRN
  Administered 2012-10-11: 4 mg via INTRAVENOUS

## 2012-10-11 MED ORDER — MORPHINE SULFATE 10 MG/ML IJ SOLN
INTRAMUSCULAR | Status: DC | PRN
  Administered 2012-10-11: 3 mg via INTRAVENOUS
  Administered 2012-10-11: 2 mg via INTRAVENOUS
  Administered 2012-10-11: 3 mg via INTRAVENOUS
  Administered 2012-10-11: 2 mg via INTRAVENOUS

## 2012-10-11 MED ORDER — FENTANYL CITRATE 0.05 MG/ML IJ SOLN
INTRAMUSCULAR | Status: DC | PRN
  Administered 2012-10-11 (×3): 50 ug via INTRAVENOUS

## 2012-10-11 MED ORDER — CEFAZOLIN SODIUM-DEXTROSE 2-3 GM-% IV SOLR
2.0000 g | INTRAVENOUS | Status: AC
  Administered 2012-10-11: 2 g via INTRAVENOUS

## 2012-10-11 MED ORDER — EPHEDRINE SULFATE 50 MG/ML IJ SOLN
INTRAMUSCULAR | Status: DC | PRN
  Administered 2012-10-11: 10 mg via INTRAVENOUS

## 2012-10-11 MED ORDER — DEXAMETHASONE SODIUM PHOSPHATE 4 MG/ML IJ SOLN
INTRAMUSCULAR | Status: DC | PRN
  Administered 2012-10-11: 10 mg via INTRAVENOUS

## 2012-10-11 MED ORDER — HYDROCODONE-ACETAMINOPHEN 5-325 MG PO TABS
1.0000 | ORAL_TABLET | ORAL | Status: DC | PRN
  Administered 2012-10-12: 1 via ORAL

## 2012-10-11 MED ORDER — PROMETHAZINE HCL 25 MG/ML IJ SOLN
12.5000 mg | Freq: Four times a day (QID) | INTRAMUSCULAR | Status: DC | PRN
  Administered 2012-10-11: 12.5 mg via INTRAVENOUS

## 2012-10-11 MED ORDER — ONDANSETRON HCL 4 MG/2ML IJ SOLN: 4.0000 mg | Freq: Four times a day (QID) | INTRAMUSCULAR | Status: DC | PRN

## 2012-10-11 MED ORDER — LIDOCAINE HCL (CARDIAC) 20 MG/ML IV SOLN
INTRAVENOUS | Status: DC | PRN
  Administered 2012-10-11: 50 mg via INTRAVENOUS

## 2012-10-11 MED ORDER — LIDOCAINE HCL 1 % IJ SOLN
INTRAVENOUS | Status: DC | PRN
  Administered 2012-10-11: 09:00:00

## 2012-10-11 MED ORDER — SUCCINYLCHOLINE CHLORIDE 20 MG/ML IJ SOLN
INTRAMUSCULAR | Status: DC | PRN
  Administered 2012-10-11: 100 mg via INTRAVENOUS

## 2012-10-11 MED ORDER — OXYCODONE HCL 5 MG PO TABS
5.0000 mg | ORAL_TABLET | Freq: Once | ORAL | Status: AC | PRN
  Administered 2012-10-11: 5 mg via ORAL

## 2012-10-11 MED ORDER — DIAZEPAM 2 MG PO TABS: 2.0000 mg | ORAL_TABLET | Freq: Two times a day (BID) | ORAL | Status: DC | PRN

## 2012-10-11 SURGICAL SUPPLY — 80 items
BAG DECANTER FOR FLEXI CONT (MISCELLANEOUS) ×3 IMPLANT
BANDAGE GAUZE ELAST BULKY 4 IN (GAUZE/BANDAGES/DRESSINGS) ×6 IMPLANT
BINDER BREAST LRG (GAUZE/BANDAGES/DRESSINGS) ×3 IMPLANT
BINDER BREAST MEDIUM (GAUZE/BANDAGES/DRESSINGS) IMPLANT
BINDER BREAST XLRG (GAUZE/BANDAGES/DRESSINGS) ×3 IMPLANT
BINDER BREAST XXLRG (GAUZE/BANDAGES/DRESSINGS) IMPLANT
BIOPATCH RED 1 DISK 7.0 (GAUZE/BANDAGES/DRESSINGS) IMPLANT
BLADE HEX COATED 2.75 (ELECTRODE) ×3 IMPLANT
BLADE SURG 10 STRL SS (BLADE) ×3 IMPLANT
BLADE SURG 15 STRL LF DISP TIS (BLADE) ×2 IMPLANT
BLADE SURG 15 STRL SS (BLADE) ×1
CANISTER LINER 1300 C W/ELBOW (MISCELLANEOUS) ×3 IMPLANT
CANISTER LIPO FAT HARVEST (MISCELLANEOUS) ×3 IMPLANT
CANISTER SUCTION 1200CC (MISCELLANEOUS) ×3 IMPLANT
CANNULA ASPIRATION (CANNULA) ×3 IMPLANT
CHLORAPREP W/TINT 26ML (MISCELLANEOUS) ×3 IMPLANT
CLOTH BEACON ORANGE TIMEOUT ST (SAFETY) ×3 IMPLANT
CORDS BIPOLAR (ELECTRODE) IMPLANT
COVER MAYO STAND STRL (DRAPES) ×3 IMPLANT
COVER TABLE BACK 60X90 (DRAPES) ×3 IMPLANT
DECANTER SPIKE VIAL GLASS SM (MISCELLANEOUS) IMPLANT
DERMABOND ADVANCED (GAUZE/BANDAGES/DRESSINGS) ×1
DERMABOND ADVANCED .7 DNX12 (GAUZE/BANDAGES/DRESSINGS) ×2 IMPLANT
DRAIN CHANNEL 19F RND (DRAIN) IMPLANT
DRAPE LAPAROSCOPIC ABDOMINAL (DRAPES) ×3 IMPLANT
DRSG PAD ABDOMINAL 8X10 ST (GAUZE/BANDAGES/DRESSINGS) ×3 IMPLANT
DRSG TEGADERM 2-3/8X2-3/4 SM (GAUZE/BANDAGES/DRESSINGS) IMPLANT
ELECT BLADE 4.0 EZ CLEAN MEGAD (MISCELLANEOUS) ×3
ELECT REM PT RETURN 9FT ADLT (ELECTROSURGICAL) ×3
ELECTRODE BLDE 4.0 EZ CLN MEGD (MISCELLANEOUS) ×2 IMPLANT
ELECTRODE REM PT RTRN 9FT ADLT (ELECTROSURGICAL) ×2 IMPLANT
EVACUATOR SILICONE 100CC (DRAIN) IMPLANT
FILTER LIPOSUCTION (MISCELLANEOUS) ×3 IMPLANT
GAUZE SPONGE 4X4 12PLY STRL LF (GAUZE/BANDAGES/DRESSINGS) IMPLANT
GLOVE BIO SURGEON STRL SZ 6.5 (GLOVE) IMPLANT
GLOVE SURG SS PI 6.5 STRL IVOR (GLOVE) ×18 IMPLANT
GOWN PREVENTION PLUS XLARGE (GOWN DISPOSABLE) ×18 IMPLANT
IMPL BREAST GEL 535CC (Breast) ×2 IMPLANT
IMPLANT BREAST GEL 535CC (Breast) ×3 IMPLANT
IV NS 1000ML (IV SOLUTION) ×1
IV NS 1000ML BAXH (IV SOLUTION) ×2 IMPLANT
IV NS 500ML (IV SOLUTION)
IV NS 500ML BAXH (IV SOLUTION) IMPLANT
KIT FILL SYSTEM UNIVERSAL (SET/KITS/TRAYS/PACK) ×3 IMPLANT
NDL SAFETY ECLIPSE 18X1.5 (NEEDLE) ×2 IMPLANT
NEEDLE HYPO 18GX1.5 SHARP (NEEDLE) ×1
NEEDLE HYPO 25X1 1.5 SAFETY (NEEDLE) ×3 IMPLANT
NEEDLE SPNL 18GX3.5 QUINCKE PK (NEEDLE) ×3 IMPLANT
NS IRRIG 1000ML POUR BTL (IV SOLUTION) ×3 IMPLANT
PACK BASIN DAY SURGERY FS (CUSTOM PROCEDURE TRAY) ×3 IMPLANT
PAD ALCOHOL SWAB (MISCELLANEOUS) ×3 IMPLANT
PENCIL BUTTON HOLSTER BLD 10FT (ELECTRODE) ×3 IMPLANT
PIN SAFETY STERILE (MISCELLANEOUS) IMPLANT
SIZER BREAST REUSE 535CC (SIZER) ×1
SIZER BRST REUSE ULT HI 535CC (SIZER) ×2 IMPLANT
SIZER GENERIC MENTOR (SIZER) ×3 IMPLANT
SLEEVE SCD COMPRESS KNEE MED (MISCELLANEOUS) ×3 IMPLANT
SPONGE GAUZE 4X4 12PLY (GAUZE/BANDAGES/DRESSINGS) IMPLANT
SPONGE LAP 18X18 X RAY DECT (DISPOSABLE) ×6 IMPLANT
STRIP CLOSURE SKIN 1/2X4 (GAUZE/BANDAGES/DRESSINGS) ×3 IMPLANT
SUT MNCRL AB 3-0 PS2 18 (SUTURE) ×3 IMPLANT
SUT MNCRL AB 4-0 PS2 18 (SUTURE) IMPLANT
SUT MON AB 5-0 PS2 18 (SUTURE) ×12 IMPLANT
SUT PDS AB 2-0 CT2 27 (SUTURE) ×9 IMPLANT
SUT SILK 3 0 PS 1 (SUTURE) IMPLANT
SUT VIC AB 3-0 SH 27 (SUTURE) ×5
SUT VIC AB 3-0 SH 27X BRD (SUTURE) ×10 IMPLANT
SUT VIC AB 5-0 PS2 18 (SUTURE) ×3 IMPLANT
SUT VICRYL 4-0 PS2 18IN ABS (SUTURE) ×15 IMPLANT
SYR 20CC LL (SYRINGE) ×6 IMPLANT
SYR 50ML LL SCALE MARK (SYRINGE) ×3 IMPLANT
SYR BULB IRRIGATION 50ML (SYRINGE) ×3 IMPLANT
SYR CONTROL 10ML LL (SYRINGE) ×3 IMPLANT
SYR TB 1ML LL NO SAFETY (SYRINGE) ×3 IMPLANT
SYRINGE TOOMEY DISP (SYRINGE) ×3 IMPLANT
TOWEL OR 17X24 6PK STRL BLUE (TOWEL DISPOSABLE) ×6 IMPLANT
TUBE CONNECTING 20X1/4 (TUBING) ×3 IMPLANT
TUBING SET GRADUATE ASPIR 12FT (MISCELLANEOUS) ×3 IMPLANT
UNDERPAD 30X30 INCONTINENT (UNDERPADS AND DIAPERS) ×6 IMPLANT
YANKAUER SUCT BULB TIP NO VENT (SUCTIONS) ×3 IMPLANT

## 2012-10-11 NOTE — Interval H&P Note (Signed)
History and Physical Interval Note:  10/11/2012 7:05 AM  Kayla Mcgrath  has presented today for surgery, with the diagnosis of BREAST CANCER;RIGHT  The various methods of treatment have been discussed with the patient and family. After consideration of risks, benefits and other options for treatment, the patient has consented to  Procedure(s): REMOVAL OF TISSUE EXPANDER AND PLACEMENT OF SILICONE IMPLANT RIGHT (Right) LEFT BREAST REDUCTION WITH MASTOPEXY (Left) LIPOSUCTION WITH LIPOFILLING (Left) as a surgical intervention .  The patient's history has been reviewed, patient examined, no change in status, stable for surgery.  I have reviewed the patient's chart and labs.  Questions were answered to the patient's satisfaction.     SANGER,Analysse Quinonez

## 2012-10-11 NOTE — Transfer of Care (Signed)
Immediate Anesthesia Transfer of Care Note  Patient: Kayla Mcgrath  Procedure(s) Performed: Procedure(s): REMOVAL OF TISSUE EXPANDER AND PLACEMENT OF SILICONE IMPLANT RIGHT (Right) LEFT BREAST REDUCTION WITH MASTOPEXY (Left) LIPOSUCTION WITH LIPOFILLING (Left)  Patient Location: PACU  Anesthesia Type:General  Level of Consciousness: awake and alert   Airway & Oxygen Therapy: Patient Spontanous Breathing and Patient connected to nasal cannula oxygen  Post-op Assessment: Report given to PACU RN and Post -op Vital signs reviewed and stable  Post vital signs: Reviewed and stable  Complications: No apparent anesthesia complications

## 2012-10-11 NOTE — Anesthesia Preprocedure Evaluation (Signed)
Anesthesia Evaluation  Patient identified by MRN, date of birth, ID band Patient awake    Reviewed: Allergy & Precautions, H&P , NPO status , Patient's Chart, lab work & pertinent test results  Airway Mallampati: I TM Distance: >3 FB Neck ROM: Full    Dental  (+) Upper Dentures, Lower Dentures and Dental Advisory Given   Pulmonary  breath sounds clear to auscultation        Cardiovascular Rhythm:Regular Rate:Normal     Neuro/Psych    GI/Hepatic   Endo/Other    Renal/GU      Musculoskeletal   Abdominal   Peds  Hematology   Anesthesia Other Findings   Reproductive/Obstetrics                           Anesthesia Physical Anesthesia Plan  ASA: II  Anesthesia Plan: General   Post-op Pain Management:    Induction: Intravenous  Airway Management Planned: Oral ETT  Additional Equipment:   Intra-op Plan:   Post-operative Plan: Extubation in OR  Informed Consent: I have reviewed the patients History and Physical, chart, labs and discussed the procedure including the risks, benefits and alternatives for the proposed anesthesia with the patient or authorized representative who has indicated his/her understanding and acceptance.   Dental advisory given  Plan Discussed with: CRNA, Anesthesiologist and Surgeon  Anesthesia Plan Comments:         Anesthesia Quick Evaluation

## 2012-10-11 NOTE — Anesthesia Postprocedure Evaluation (Signed)
  Anesthesia Post-op Note  Patient: Kayla Julio Kemple  Procedure(s) Performed: Procedure(s): REMOVAL OF TISSUE EXPANDER AND PLACEMENT OF SILICONE IMPLANT RIGHT (Right) LEFT BREAST REDUCTION WITH MASTOPEXY (Left) LIPOSUCTION WITH LIPOFILLING (Left)  Patient Location: PACU  Anesthesia Type:General  Level of Consciousness: awake, alert  and oriented  Airway and Oxygen Therapy: Patient Spontanous Breathing  Post-op Pain: mild  Post-op Assessment: Post-op Vital signs reviewed  Post-op Vital Signs: Reviewed  Complications: No apparent anesthesia complications

## 2012-10-11 NOTE — Brief Op Note (Signed)
10/11/2012  11:38 AM  PATIENT:  Kayla Mcgrath  67 y.o. female  PRE-OPERATIVE DIAGNOSIS:  BREAST CANCER;RIGHT  POST-OPERATIVE DIAGNOSIS:  BREAST CANCER;RIGHT  PROCEDURE:  Procedure(s): REMOVAL OF TISSUE EXPANDER AND PLACEMENT OF SILICONE IMPLANT RIGHT (Right) LEFT BREAST REDUCTION WITH MASTOPEXY (Left) LIPOSUCTION WITH LIPOFILLING (Left)  SURGEON:  Surgeon(s) and Role:    * Claire Sanger, DO - Primary  PHYSICIAN ASSISTANT: Shawn Rayburn, PA  ASSISTANTS: none   ANESTHESIA:   general  EBL:  Total I/O In: 1800 [I.V.:1800] Out: -   BLOOD ADMINISTERED:none  DRAINS: none   LOCAL MEDICATIONS USED:  LIDOCAINE   SPECIMEN:  Source of Specimen:  right breast capsule and skin medial and lateral  DISPOSITION OF SPECIMEN:  PATHOLOGY  COUNTS:  YES  TOURNIQUET:  * No tourniquets in log *  DICTATION: .Dragon Dictation  PLAN OF CARE: Discharge to home after PACU  PATIENT DISPOSITION:  PACU - hemodynamically stable.   Delay start of Pharmacological VTE agent (>24hrs) due to surgical blood loss or risk of bleeding: no

## 2012-10-11 NOTE — Op Note (Signed)
Op report   DATE OF OPERATION: 10/11/2012  SURGICAL DIVISION: Plastic Surgery  PREOPERATIVE DIAGNOSES:  1. History of right breast cancer.  2. Acquired absence of right breast.  3. Breast Asymmetry after reconstruction.  POSTOPERATIVE DIAGNOSES:  1. History of right breast cancer.  2. Acquired absence of right breast.  3. Breast asymmetry after reconstruction.  PROCEDURE:  1. Bilateral exchange of tissue expanders for implants.  2. Bilateral capsulotomies for implant respositioning.  SURGEON: Wayland Denis, DO  ASSISTANT: Shawn Rayburn, PA  ANESTHESIA:  General.   COMPLICATIONS: None.   IMPLANTS: Right - [employer] Smooth Round Ultra High Profile Gel 535cc. Ref #[id number].  Serial Number [id number]  INDICATIONS FOR PROCEDURE:  The patient, Kayla Mcgrath, is a 67 y.o. female born on March 01, 1945, is here for treatment after a right mastectomy.  She had a tissue expander placed at the time of her mastectomy.  She now presents for exchange of her expander for an implant.  She requires capsulotomies to better position the implant.   CONSENT:  Informed consent was obtained directly from the patient. Risks, benefits and alternatives were fully discussed. Specific risks including but not limited to bleeding, infection, hematoma, seroma, scarring, pain, implant infection, implant extrusion, capsular contracture, asymmetry, wound healing problems, and need for further surgery were all discussed. The patient did have an ample opportunity to have her questions answered to her satisfaction.   DESCRIPTION OF PROCEDURE:  The patient was taken to the operating room. SCDs were placed and IV antibiotics were given. The patient's chest was prepped and draped in a sterile fashion. A time out was performed and the implant to be used was identified.  One percent Xylocaine with epinephrine was used to infiltrate the area.   The old mastectomy scar was opened and superior mastectomy and inferior mastectomy flaps  were re-raised over the pectoralis major muscle. The pectoralis was split to expose the tissue expander which was removed. Inspection of the pocket showed a normal healthy capsule and good integration of the biologic matrix.   A lateral capsulectomy was performed to allow for breast pocket expansion and medial positioning of the implant.  The capsule was release in all other directions. Measurements were made on either side to confirm adequate pocket size for the implant dimensions and a sizer was used.  Hemostasis was ensured.  Gloves were changed. The implant was placed in the pocket and oriented appropriately. The pectoralis major muscle and  capsule on the anterior surface were re-closed with a 3-0 Vicryl suture followed by 4-0 Vicryl suture. Tumescent was infused into the abdominal area.  After waiting several minutes liposuction was used to harvest the fat. The dog ear was excised from the medial and lateral right breast.  The fat was then filled into the superior and medial aspect of the right breast.  A total of 250 cc of fat was injected.  The remaining skin was closed with 5-0 Monocryl.   Attention was then turned to the left breast.  Preoperative markings were confirmed.  Incision lines were injected with 1% Xylocaine with epinephrine.  After waiting for vasoconstriction, the marked lines were incised.  An inferor breast reduction was performed by de-epithelializing the pedicle, using bovie to create the superior, lateral and medial flaps while keeping the inferior pedical intact. The breast tissue removed was weighed (514 gm).  Care was taken to not undermine the breast pedicle. Hemostasis was achieved.  The nipple was gently rotated into position and the skin was closed with stitches.  The patient was sat upright and size and shape symmetry was confirmed.  Hemostasis was achieved with electrocautery.  The deep tissues were approximated with 3-0 Vicryl followed by 4-0 Vicryl.  The skin was closed  with 5-0 Monocryl sutures.  The nipple and skin flaps had good capillary refill at the end of the procedure.  Sterile dressings were applied.  A breast binder and ABDs were placed.  The patient was allowed to wake from anesthesia and taken to the recovery room in satisfactory condition.  The patient tolerated the procedure well. The patient was awoken from anesthesia and taken to the recovery room in satisfactory condition.

## 2012-10-11 NOTE — H&P (View-Only) (Signed)
Kayla Mcgrath is an 67 y.o. female.   Chief Complaint: Acquired absence of right breast and left breast asymmetry after breast reconstruction. HPI: The patient is a 67 yrs old wf here for history and physical for secondary right breast reconstruction with expander removal and silicone implant placement and left breast reduction for symmetry.  She has a diagnosis of right breast invasive ductal carcinoma with ER/PR positive, HER-2 negative pathology. She had lumpectomies in the past for right DCIS 2010. She is not planning on chemo or radiation and no genetic testing. She has been on hormone replacement for many years. She has two kids. She is 5 feet 1 inch tall, weighs 175 pounds and was a 36 DD bra. She would like to be around a C cup.  She has significant breast ptosis on the left.  She has a dog ear over the right medial breast.    Past Medical History  Diagnosis Date  . DCIS (ductal carcinoma in situ) of breast 2010  . Hyperlipidemia   . Breast cancer   . Skin cancer   . Joint pain   . Wears glasses   . Full dentures     Past Surgical History  Procedure Laterality Date  . Right lumpectomy  december 2010    cancer-tamoxifen  . Dilation and curettage of uterus    . Colonoscopy    . Mastectomy w/ sentinel node biopsy Right 06/20/2012    Procedure: right skin sparing MASTECTOMY WITH SENTINEL LYMPH NODE BIOPSY;  Surgeon: Kayla Lint, MD;  Location: [facility];  Service: General;  Laterality: Right;  . Breast reconstruction with placement of tissue expander and flex hd (acellular hydrated dermis) Right 06/20/2012    Procedure: BREAST RECONSTRUCTION WITH PLACEMENT OF TISSUE EXPANDER AND FLEX HD (ACELLULAR HYDRATED DERMIS);  Surgeon: Kayla Denis, DO;  Location: [facility];  Service: Plastics;  Laterality: Right;    Family History  Problem Relation Age of Onset  . Alcohol abuse Mother   . Alcohol abuse Father   . Mental illness Father    Social History:   reports that she quit smoking about 31 years ago. She does not have any smokeless tobacco history on file. She reports that she does not drink alcohol or use illicit drugs.  Allergies:  Allergies  Allergen Reactions  . Latex Rash     (Not in a hospital admission)  No results found for this or any previous visit (from the past 48 hour(s)). Dg Bone Density  10/02/2012   *RADIOLOGY REPORT*  Clinical Data: 67 year old postmenopausal female with history of right breast cancer and vitamin D deficiency.  The patient has taken Arimidex for 1 month.  She takes vitamin D.  DUAL X-RAY ABSORPTIOMETRY (DXA) FOR BONE MINERAL DENSITY  AP LUMBAR SPINE (L3 - L4)  Bone Mineral Density (BMD):            0.857 g/cm2 Young Adult T Score:                          -2.2 Z Score:                                                -0.3  LEFT FEMUR (NECK)  Bone Mineral Density (BMD):  0.705 g/cm2 Young Adult T Score:                           -1.3 Z Score:                                                 0.2  ASSESSMENT:  Patient's diagnostic category is LOW BONE MASS by WHO Criteria.  FRACTURE RISK: MODERATE  FRAX: Based on the World Health Organization FRAX model, the 10 year probability of a major osteoporotic fracture is 8%.  The 10 year probability of a hip fracture is 0.7%.  L1 and L2 are excluded due to a greater than one standard deviation difference in T-score from adjacent vertebral bodies.  Comparison: None.  RECOMMENDATIONS:  All patients should ensure an adequate intake of dietary calcium (1200mg  daily) and vitamin D (800 IU daily) unless contraindicated. The National Osteoporosis Foundation recommends that FDA-approved medical therapies be considered in postmenopausal women and mean age 72 or older with a:  1)    Hip or vertebral (clinical or morphometric) fracture.  2)   T-score of -2.5 or lower at the spine or hip.  3)   Ten-year fracture probability by FRAX of 3% or greater for hip fracture or 20% or  greater for major osteoporotic fracture.  FOLLOW-UP:  People with diagnosed cases of osteoporosis or at high risk for fracture should have regular bone mineral density tests.  For patients eligible for Medicare, routine testing is allowed once every 2 years.  The testing frequency can be increased to one year for patients who have rapidly progressing disease, those who are receiving or discontinuing medical therapy to restore bone mass, or have additional risk factors.   Original Report Authenticated By: Kayla Mcgrath, M.D.    Review of Systems  Constitutional: Negative.   HENT: Negative.   Eyes: Negative.   Respiratory: Negative.   Gastrointestinal: Negative.   Genitourinary: Negative.   Musculoskeletal: Negative.   Skin: Negative.   Neurological: Negative.   Endo/Heme/Allergies: Negative.   Psychiatric/Behavioral: Negative.     There were no vitals taken for this visit. Physical Exam  Constitutional: She appears well-developed and well-nourished.  HENT:  Head: Normocephalic and atraumatic.  Eyes: Conjunctivae and EOM are normal. Pupils are equal, round, and reactive to light.  Neck: Normal range of motion.  Cardiovascular: Normal rate.   Respiratory: Effort normal.    Neurological: She is alert.  Skin: Skin is warm.  Psychiatric: She has a normal mood and affect. Her behavior is normal. Judgment and thought content normal.     Assessment/Plan Right breast reconstruction with removal of expander, placement of silicone implant.  Repair of medial dog ear. Left breast reduction/mastopexy for symmetry.  Risks and complications were reviewed.  SANGER,Kayla Mcgrath 10/04/2012, 7:33 AM

## 2012-10-11 NOTE — Anesthesia Procedure Notes (Signed)
Procedure Name: Intubation Date/Time: 10/11/2012 7:44 AM Performed by: Caren Macadam Pre-anesthesia Checklist: Patient identified, Emergency Drugs available, Suction available and Patient being monitored Patient Re-evaluated:Patient Re-evaluated prior to inductionOxygen Delivery Method: Circle System Utilized Preoxygenation: Pre-oxygenation with 100% oxygen Intubation Type: IV induction Ventilation: Mask ventilation without difficulty Laryngoscope Size: Miller and 2 Grade View: Grade I Tube type: Oral Tube size: 7.0 mm Number of attempts: 1 Airway Equipment and Method: stylet and oral airway Placement Confirmation: ETT inserted through vocal cords under direct vision,  positive ETCO2 and breath sounds checked- equal and bilateral Secured at: 22 cm Tube secured with: Tape Dental Injury: Teeth and Oropharynx as per pre-operative assessment

## 2012-10-12 ENCOUNTER — Encounter (HOSPITAL_BASED_OUTPATIENT_CLINIC_OR_DEPARTMENT_OTHER): Payer: Self-pay | Admitting: Plastic Surgery

## 2012-10-19 ENCOUNTER — Telehealth: Payer: Self-pay | Admitting: *Deleted

## 2012-10-19 NOTE — Telephone Encounter (Signed)
Pt called to cancel her labs on 10/16 and to rs for 10/20. gv appt for 10/29/12 @ 9:15am per pt request...td

## 2012-10-25 ENCOUNTER — Other Ambulatory Visit: Payer: Medicare HMO

## 2012-10-29 ENCOUNTER — Other Ambulatory Visit (HOSPITAL_BASED_OUTPATIENT_CLINIC_OR_DEPARTMENT_OTHER): Payer: Commercial Managed Care - HMO

## 2012-10-29 DIAGNOSIS — C50219 Malignant neoplasm of upper-inner quadrant of unspecified female breast: Secondary | ICD-10-CM

## 2012-10-29 DIAGNOSIS — E559 Vitamin D deficiency, unspecified: Secondary | ICD-10-CM

## 2012-10-29 LAB — COMPREHENSIVE METABOLIC PANEL (CC13)
ALT: 19 U/L (ref 0–55)
AST: 24 U/L (ref 5–34)
Albumin: 3.4 g/dL — ABNORMAL LOW (ref 3.5–5.0)
Alkaline Phosphatase: 110 U/L (ref 40–150)
Anion Gap: 9 mEq/L (ref 3–11)
Potassium: 4.4 mEq/L (ref 3.5–5.1)
Sodium: 140 mEq/L (ref 136–145)
Total Bilirubin: 0.4 mg/dL (ref 0.20–1.20)
Total Protein: 7.5 g/dL (ref 6.4–8.3)

## 2012-10-29 LAB — CBC WITH DIFFERENTIAL/PLATELET
BASO%: 0.2 % (ref 0.0–2.0)
Basophils Absolute: 0 10e3/uL (ref 0.0–0.1)
EOS%: 3.3 % (ref 0.0–7.0)
Eosinophils Absolute: 0.2 10e3/uL (ref 0.0–0.5)
HCT: 39.1 % (ref 34.8–46.6)
HGB: 13.1 g/dL (ref 11.6–15.9)
LYMPH%: 24.2 % (ref 14.0–49.7)
MCH: 30.4 pg (ref 25.1–34.0)
MCHC: 33.4 g/dL (ref 31.5–36.0)
MCV: 90.9 fL (ref 79.5–101.0)
MONO#: 0.5 10e3/uL (ref 0.1–0.9)
MONO%: 10 % (ref 0.0–14.0)
NEUT#: 3.2 10e3/uL (ref 1.5–6.5)
NEUT%: 62.3 % (ref 38.4–76.8)
Platelets: 296 10e3/uL (ref 145–400)
RBC: 4.31 10e6/uL (ref 3.70–5.45)
RDW: 14 % (ref 11.2–14.5)
WBC: 5.1 10e3/uL (ref 3.9–10.3)
lymph#: 1.2 10e3/uL (ref 0.9–3.3)

## 2012-11-01 ENCOUNTER — Encounter: Payer: Self-pay | Admitting: Physician Assistant

## 2012-11-01 ENCOUNTER — Ambulatory Visit (HOSPITAL_BASED_OUTPATIENT_CLINIC_OR_DEPARTMENT_OTHER): Payer: Medicare HMO | Admitting: Physician Assistant

## 2012-11-01 ENCOUNTER — Telehealth: Payer: Self-pay | Admitting: *Deleted

## 2012-11-01 VITALS — BP 132/75 | HR 80 | Temp 98.3°F | Resp 18 | Ht 61.0 in | Wt 178.0 lb

## 2012-11-01 DIAGNOSIS — M899 Disorder of bone, unspecified: Secondary | ICD-10-CM

## 2012-11-01 DIAGNOSIS — C50219 Malignant neoplasm of upper-inner quadrant of unspecified female breast: Secondary | ICD-10-CM

## 2012-11-01 DIAGNOSIS — C50211 Malignant neoplasm of upper-inner quadrant of right female breast: Secondary | ICD-10-CM

## 2012-11-01 DIAGNOSIS — Z9010 Acquired absence of unspecified breast and nipple: Secondary | ICD-10-CM

## 2012-11-01 DIAGNOSIS — Z1231 Encounter for screening mammogram for malignant neoplasm of breast: Secondary | ICD-10-CM

## 2012-11-01 NOTE — Telephone Encounter (Signed)
appts made and printed...td 

## 2012-11-01 NOTE — Progress Notes (Signed)
ID: Kayla Mcgrath   DOB: [birth year]  MR#: [id number]  LOV#:[id number]  PCP: Ruthe Mannan, MD GYN:  SUAlmond Lint OTHER MD: Lurline Hare; Rosalva Ferron (FAX [phone]), Wayland Denis  CHIEF COMPLAINT:  Right Breast Cancer   HISTORY OF PRESENT ILLNESS: Kayla Mcgrath underwent right lumpectomy in December of 2010 for a 1.2 cm ductal carcinoma in situ, intermediate grade, with close but negative margins (1 mm). I do not have the prognostic panel, but per patient and history the tumor was estrogen receptor positive and Hampton took tamoxifen for approximately one year. She declined radiation.  She was then followed closely, with mammography every 6 months. On 03/08/2012 a new area of amorphous calcifications measuring 2 mm were noted at the 9:00 position of the right breast. There was also an isodense oval mass measuring 7 mm at the 2:00 position in the same breast. Ultrasound of the right breast confirmed a 6 mm oval hypoechoic mass with microlobulated margins.  The patient was referred to the breast Center for biopsy, and on for 15 2014 underwent biopsy of the small area of calcifications in the right breast as well as the breast mass in the same breast. These showed ([id number]) invasive ductal carcinoma, grade 2 or 3, estrogen receptor 100% positive, progesterone receptor 10% positive, with an MIB-1 of 35%, and no HER-2 amplification. The small area of calcification showed only atypical ductal hyperplasia.  The patient's subsequent history is as detailed below.  INTERVAL HISTORY: Kayla Mcgrath returns alone today for followup of her right breast. Kayla Mcgrath underwent her right mastectomy in June of 2014. She recently underwent reconstruction under the care of Dr. Kelly Splinter on 10/11/2012, consisting of implant reconstruction on the right and reduction mammoplasty on the left. She's had quite a bit of bruising, but otherwise no complications. She is healing well, and in fact has needed no pain medication over the past  3-4 days. She scheduled to see Dr. Kelly Splinter again for followup next week.  Otherwise, Kayla Mcgrath has continued on anastrozole which she is tolerating very well. She's had no significant hot flashes. She does have some vaginal dryness and mild dyspareunia.  She has some known arthritis in her right knee, but denies any additional joint pain since beginning the aromatase inhibitor.   REVIEW OF SYSTEMS: Kayla Mcgrath has had no recent fevers or chills. She denies any skin changes or rashes and has had no abnormal bruising or bleeding. Her appetite is good and she denies any nausea or change in bowel or bladder habits. She's had no cough, orthopnea, shortness of breath, chest pain, or palpitations. She also denies any abnormal headaches or dizziness. She's had no new or unusual myalgias, arthralgias, or bony pain, and denies any peripheral swelling.  A detailed review of systems is otherwise stable and noncontributory.   PAST MEDICAL HISTORY: Past Medical History  Diagnosis Date  . DCIS (ductal carcinoma in situ) of breast 2010  . Hyperlipidemia   . Breast cancer   . Skin cancer   . Joint pain   . Wears glasses   . Full dentures     PAST SURGICAL HISTORY: Past Surgical History  Procedure Laterality Date  . Right lumpectomy  december 2010    cancer-tamoxifen  . Dilation and curettage of uterus    . Colonoscopy    . Mastectomy w/ sentinel node biopsy Right 06/20/2012    Procedure: right skin sparing MASTECTOMY WITH SENTINEL LYMPH NODE BIOPSY;  Surgeon: Almond Lint, MD;  Location: [facility];  Service:  General;  Laterality: Right;  . Breast reconstruction with placement of tissue expander and flex hd (acellular hydrated dermis) Right 06/20/2012    Procedure: BREAST RECONSTRUCTION WITH PLACEMENT OF TISSUE EXPANDER AND FLEX HD (ACELLULAR HYDRATED DERMIS);  Surgeon: Wayland Denis, DO;  Location: [facility];  Service: Plastics;  Laterality: Right;  . Removal of tissue expander  and placement of implant Right 10/11/2012    Procedure: REMOVAL OF TISSUE EXPANDER AND PLACEMENT OF SILICONE IMPLANT RIGHT;  Surgeon: Wayland Denis, DO;  Location: [facility];  Service: Plastics;  Laterality: Right;  . Breast reduction with mastopexy Left 10/11/2012    Procedure: LEFT BREAST REDUCTION WITH MASTOPEXY;  Surgeon: Wayland Denis, DO;  Location: [facility];  Service: Plastics;  Laterality: Left;  . Liposuction with lipofilling Left 10/11/2012    Procedure: LIPOSUCTION WITH LIPOFILLING;  Surgeon: Wayland Denis, DO;  Location: [facility];  Service: Plastics;  Laterality: Left;    FAMILY HISTORY Family History  Problem Relation Age of Onset  . Alcohol abuse Mother   . Alcohol abuse Father   . Mental illness Father    the patient's father committed suicide at the age of 44. The patient's mother died at the age of 87 in the setting of alcohol abuse. The patient has one brother, 2 sisters. There is no history of breast or ovarian cancer in the family to the patient's knowledge.  GYNECOLOGIC HISTORY: Menarche age 72, first live birth age 23, the patient is [place] P2. She went through menopause approximately 1998. She did not take hormone replacement.  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 Her husband Kayla Mcgrath, currently retired, used to Public house manager. Daughter Kayla Mcgrath lives in [place] and works as an Charity fundraiser for [employer] and also at [employer] in [place]. Daughter Kayla Mcgrath is a homemaker in [place]. The patient has 6 grandchildren and one on the way. She is a International aid/development worker.   ADVANCED DIRECTIVES: Not in place  HEALTH MAINTENANCE: (Updated 11/01/2012) History  Substance Use Topics  . Smoking status: Former Smoker    Quit date: 06/14/1981  . Smokeless tobacco: Never Used  . Alcohol Use: No     Colonoscopy: 2009  PAP: 2012  Bone density: Sept 2014, osteopenia  Lipid panel: April 2014, Dr.  Dayton Martes   Allergies  Allergen Reactions  . Latex Rash    Current Outpatient Prescriptions  Medication Sig Dispense Refill  . anastrozole (ARIMIDEX) 1 MG tablet Take 1 tablet (1 mg total) by mouth daily.  90 tablet  12  . Cholecalciferol (VITAMIN D) 2000 UNITS CAPS Take one by mouth daily      . diazepam (VALIUM) 5 MG tablet Take 5 mg by mouth every 6 (six) hours as needed for anxiety.      Marland Kitchen HYDROcodone-acetaminophen (NORCO/VICODIN) 5-325 MG per tablet Take 1 tablet by mouth every 6 (six) hours as needed for pain.       No current facility-administered medications for this visit.    OBJECTIVE: Middle-aged white woman who appears comfortable and is in no acute distress Filed Vitals:   11/01/12 0937  BP: 132/75  Pulse: 80  Temp: 98.3 F (36.8 C)  Resp: 18     Body mass index is 33.65 kg/(m^2).    ECOG FS: 1 Filed Weights   11/01/12 0937  Weight: 178 lb (80.74 kg)   Physical Exam: HEENT:  Sclerae anicteric.  Oropharynx clear. Buccal mucosa is pink and moist.  NODES:  No cervical or supraclavicular lymphadenopathy palpated.  BREAST EXAM:  Patient is status post right mastectomy with recent reconstruction, healing well with no erythema, drainage, or evidence of infection. No evidence of local recurrence. Left breast is status post recent reduction mammoplasty, with well healing incisions and no evidence of infection. Axillae are benign bilaterally, with no palpable lymphadenopathy. LUNGS:  Clear to auscultation bilaterally.  No wheezes or rhonchi HEART:  Regular rate and rhythm. No murmur  ABDOMEN:  Soft, nontender.  Positive bowel sounds.  MSK:  No focal spinal tenderness to palpation. Full range of motion in the upper extremities bilaterally. EXTREMITIES:  No peripheral edema.   NEURO:  Nonfocal. Well oriented.  Positive affect.   LAB RESULTS: Lab Results  Component Value Date   WBC 5.1 10/29/2012   NEUTROABS 3.2 10/29/2012   HGB 13.1 10/29/2012   HCT 39.1 10/29/2012   MCV  90.9 10/29/2012   PLT 296 10/29/2012      Chemistry      Component Value Date/Time   NA 140 10/29/2012 0911   NA 139 04/30/2012 0903   K 4.4 10/29/2012 0911   K 4.5 04/30/2012 0903   CL 105 05/02/2012 0822   CL 104 04/30/2012 0903   CO2 25 10/29/2012 0911   CO2 28 04/30/2012 0903   BUN 14.9 10/29/2012 0911   BUN 16 04/30/2012 0903   CREATININE 0.8 10/29/2012 0911   CREATININE 0.8 04/30/2012 0903      Component Value Date/Time   CALCIUM 10.2 10/29/2012 0911   CALCIUM 10.0 04/30/2012 0903   ALKPHOS 110 10/29/2012 0911   ALKPHOS 84 04/30/2012 0903   AST 24 10/29/2012 0911   AST 28 04/30/2012 0903   ALT 19 10/29/2012 0911   ALT 26 04/30/2012 0903   BILITOT 0.40 10/29/2012 0911   BILITOT 0.8 04/30/2012 0903       STUDIES:  Dg Bone Density  10/02/2012   *RADIOLOGY REPORT*  Clinical Data: 67 year old postmenopausal female with history of right breast cancer and vitamin D deficiency.  The patient has taken Arimidex for 1 month.  She takes vitamin D.  DUAL X-RAY ABSORPTIOMETRY (DXA) FOR BONE MINERAL DENSITY  AP LUMBAR SPINE (L3 - L4)  Bone Mineral Density (BMD):            0.857 g/cm2 Young Adult T Score:                          -2.2 Z Score:                                                -0.3  LEFT FEMUR (NECK)  Bone Mineral Density (BMD):             0.705 g/cm2 Young Adult T Score:                           -1.3 Z Score:                                                 0.2  ASSESSMENT:  Patient's diagnostic category is LOW BONE MASS by WHO Criteria.  FRACTURE RISK: MODERATE  FRAX: Based on the World Health Organization FRAX model, the 10 year probability of a major osteoporotic fracture is 8%.  The 10 year probability of a hip fracture is 0.7%.  L1 and L2 are excluded due to a greater than one standard deviation difference in T-score from adjacent vertebral bodies.  Comparison: None.  RECOMMENDATIONS:  All patients should ensure an adequate intake of dietary calcium (1200mg  daily) and vitamin D  (800 IU daily) unless contraindicated. The National Osteoporosis Foundation recommends that FDA-approved medical therapies be considered in postmenopausal women and mean age 86 or older with a:  1)    Hip or vertebral (clinical or morphometric) fracture.  2)   T-score of -2.5 or lower at the spine or hip.  3)   Ten-year fracture probability by FRAX of 3% or greater for hip fracture or 20% or greater for major osteoporotic fracture.  FOLLOW-UP:  People with diagnosed cases of osteoporosis or at high risk for fracture should have regular bone mineral density tests.  For patients eligible for Medicare, routine testing is allowed once every 2 years.  The testing frequency can be increased to one year for patients who have rapidly progressing disease, those who are receiving or discontinuing medical therapy to restore bone mass, or have additional risk factors.   Original Report Authenticated By: Cain Saupe, M.D.    ASSESSMENT: 67 y.o. [place] woman  (1) status post right lumpectomy December of 2010 for ductal carcinoma in situ, intermediate grade, estrogen receptor positive, with close but negative margins, the patient refusing adjuvant radiation therapy, but taking adjuvant tamoxifen approximately one year  (2) status post right breast upper inner quadrant biopsy 04/26/2012 for a clinical T1b N0, stage IA invasive ductal carcinoma, intermediate grade, estrogen receptor 100% and progesterone receptor 10% positive, with no HER-2 amplification, and an MIB-1 of 35%  (3) status post right mastectomy and sentinel lymph node sampling 06/20/2012 for a pT1b pN0, stage IA invasive ductal carcinoma, grade 2, again HER-2 negative.  (4) Oncotype DX score of 11 predicting a distant recurrence within 10 years of 8% if the patient's only adjuvant systemic treatment is tamoxifen for 5 years.  (5) began on anastrozole in July 2014, the plan being to continue for total of 5 years (until July 2019)  (6)  status post  reconstruction on the right, and reduction mammoplasty on the left on 10-14 under the care of Dr. Kelly Splinter   PLAN:  Kayla Mcgrath is tolerating the anastrozole very well, and I'm making no changes to her current regimen. We did review her recent bone density in September which showed osteopenia, and we discussed bisphosphonate therapy which at this time she declines. I encouraged her to walk on a daily basis which she is trying to do, and she'll continue with her vitamin D supplements as well. (She is not taking a calcium supplement currently since her calcium levels tend to run on the upper range of normal.)    We'll plan on seeing Kayla Mcgrath back in 3 months, January 2015, for labs and physical exam. Her next left mammogram will be [facility] at the [facility] in April. All this was reviewed with her today, and she voices understanding and agreement with this plan. She will call any changes or problems prior to her next appointment.    Kayla Mosher PA-C     11/01/2012

## 2013-01-28 ENCOUNTER — Other Ambulatory Visit (HOSPITAL_BASED_OUTPATIENT_CLINIC_OR_DEPARTMENT_OTHER): Payer: Medicare HMO

## 2013-01-28 DIAGNOSIS — C50219 Malignant neoplasm of upper-inner quadrant of unspecified female breast: Secondary | ICD-10-CM

## 2013-01-28 DIAGNOSIS — C50211 Malignant neoplasm of upper-inner quadrant of right female breast: Secondary | ICD-10-CM

## 2013-01-28 LAB — CBC WITH DIFFERENTIAL/PLATELET
BASO%: 1.1 % (ref 0.0–2.0)
Basophils Absolute: 0.1 10*3/uL (ref 0.0–0.1)
EOS ABS: 0.2 10*3/uL (ref 0.0–0.5)
EOS%: 3.7 % (ref 0.0–7.0)
HCT: 44 % (ref 34.8–46.6)
HGB: 14.7 g/dL (ref 11.6–15.9)
LYMPH%: 24.6 % (ref 14.0–49.7)
MCH: 30.7 pg (ref 25.1–34.0)
MCHC: 33.5 g/dL (ref 31.5–36.0)
MCV: 91.6 fL (ref 79.5–101.0)
MONO#: 0.5 10*3/uL (ref 0.1–0.9)
MONO%: 9 % (ref 0.0–14.0)
NEUT%: 61.6 % (ref 38.4–76.8)
NEUTROS ABS: 3.1 10*3/uL (ref 1.5–6.5)
Platelets: 246 10*3/uL (ref 145–400)
RBC: 4.8 10*6/uL (ref 3.70–5.45)
RDW: 13.5 % (ref 11.2–14.5)
WBC: 5.1 10*3/uL (ref 3.9–10.3)
lymph#: 1.2 10*3/uL (ref 0.9–3.3)

## 2013-01-28 LAB — COMPREHENSIVE METABOLIC PANEL (CC13)
ALK PHOS: 103 U/L (ref 40–150)
ALT: 25 U/L (ref 0–55)
ANION GAP: 10 meq/L (ref 3–11)
AST: 29 U/L (ref 5–34)
Albumin: 3.9 g/dL (ref 3.5–5.0)
BUN: 13.7 mg/dL (ref 7.0–26.0)
CO2: 27 mEq/L (ref 22–29)
Calcium: 10.6 mg/dL — ABNORMAL HIGH (ref 8.4–10.4)
Chloride: 103 mEq/L (ref 98–109)
Creatinine: 0.8 mg/dL (ref 0.6–1.1)
GLUCOSE: 98 mg/dL (ref 70–140)
POTASSIUM: 4.6 meq/L (ref 3.5–5.1)
SODIUM: 140 meq/L (ref 136–145)
TOTAL PROTEIN: 7.8 g/dL (ref 6.4–8.3)
Total Bilirubin: 0.42 mg/dL (ref 0.20–1.20)

## 2013-02-04 ENCOUNTER — Encounter (INDEPENDENT_AMBULATORY_CARE_PROVIDER_SITE_OTHER): Payer: Self-pay

## 2013-02-04 ENCOUNTER — Telehealth: Payer: Self-pay | Admitting: *Deleted

## 2013-02-04 ENCOUNTER — Ambulatory Visit (HOSPITAL_BASED_OUTPATIENT_CLINIC_OR_DEPARTMENT_OTHER): Payer: Commercial Managed Care - HMO | Admitting: Oncology

## 2013-02-04 VITALS — BP 124/29 | HR 93 | Temp 98.7°F | Resp 18 | Ht 61.0 in | Wt 176.4 lb

## 2013-02-04 DIAGNOSIS — M949 Disorder of cartilage, unspecified: Secondary | ICD-10-CM

## 2013-02-04 DIAGNOSIS — M899 Disorder of bone, unspecified: Secondary | ICD-10-CM

## 2013-02-04 DIAGNOSIS — C50219 Malignant neoplasm of upper-inner quadrant of unspecified female breast: Secondary | ICD-10-CM

## 2013-02-04 NOTE — Progress Notes (Signed)
ID: Kayla Mcgrath   DOB: Mar 23, 1945  MR#: [id number]  UUV#:[id number]  PCP: Arnette Norris, MD GYN:  SUStark Klein OTHER MD: [name]; [name] [phone]), Theodoro Kos  CHIEF COMPLAINT:  Right Breast Cancer   HISTORY OF PRESENT ILLNESS: Kayla Mcgrath underwent right lumpectomy in December of 2010 for a 1.2 cm ductal carcinoma in situ, intermediate grade, with close but negative margins (1 mm). I do not have the prognostic panel, but per patient and history the tumor was estrogen receptor positive and Kayla Mcgrath took tamoxifen for approximately one year. She declined radiation.  She was then followed closely, with mammography every 6 months. On 03/08/2012 a new area of amorphous calcifications measuring 2 mm were noted at the 9:00 position of the right breast. There was also an isodense oval mass measuring 7 mm at the 2:00 position in the same breast. Ultrasound of the right breast confirmed a 6 mm oval hypoechoic mass with microlobulated margins.  The patient was referred to the breast Center for biopsy, and on for 15 2014 underwent biopsy of the small area of calcifications in the right breast as well as the breast mass in the same breast. These showed ([id number]) invasive ductal carcinoma, grade 2 or 3, estrogen receptor 100% positive, progesterone receptor 10% positive, with an MIB-1 of 35%, and no HER-2 amplification. The small area of calcification showed only atypical ductal hyperplasia.  The patient's subsequent history is as detailed below.  INTERVAL HISTORY: Kayla Mcgrath returns today for followup of her breast cancer. She continues to work closely with Dr. Migdalia Dk under reconstruction, which she hopes to complete sometime in February. A good friend of Kayla Mcgrath died from metastatic breast cancer in the last few months and she is appropriately grieving. She took herself to the [place] on a cruise and that did help raise her spirits some. Another issue is that her insurance is telling her  apparently that her primary care physician need to order all her tests. She is confused regarding this. We are having her meet with one of our financial advisor stated just to clarify the issues.  REVIEW OF SYSTEMS: Kayla Mcgrath is tolerating the anastrozole with no problems with hot flashes. She does have vaginal dryness. This is not clearly worse than before, but it certainly is not better. She thinks she was mildly depressed when her friend died, although I think she was simply grieving appropriately. Her dentures don't fit well. Otherwise a detailed review of systems today was noncontributory   PAST MEDICAL HISTORY: Past Medical History  Diagnosis Date  . DCIS (ductal carcinoma in situ) of breast 2010  . Hyperlipidemia   . Breast cancer   . Skin cancer   . Joint pain   . Wears glasses   . Full dentures     PAST SURGICAL HISTORY: Past Surgical History  Procedure Laterality Date  . Right lumpectomy  december 2010    cancer-tamoxifen  . Dilation and curettage of uterus    . Colonoscopy    . Mastectomy w/ sentinel node biopsy Right 06/20/2012    Procedure: right skin sparing MASTECTOMY WITH SENTINEL LYMPH NODE BIOPSY;  Surgeon: Stark Klein, MD;  Location: [facility];  Service: General;  Laterality: Right;  . Breast reconstruction with placement of tissue expander and flex hd (acellular hydrated dermis) Right 06/20/2012    Procedure: BREAST RECONSTRUCTION WITH PLACEMENT OF TISSUE EXPANDER AND FLEX HD (ACELLULAR HYDRATED DERMIS);  Surgeon: Theodoro Kos, DO;  Location: [facility];  Service:  Plastics;  Laterality: Right;  . Removal of tissue expander and placement of implant Right 10/11/2012    Procedure: REMOVAL OF TISSUE EXPANDER AND PLACEMENT OF SILICONE IMPLANT RIGHT;  Surgeon: Theodoro Kos, DO;  Location: [facility];  Service: Plastics;  Laterality: Right;  . Breast reduction with mastopexy Left 10/11/2012    Procedure: LEFT BREAST REDUCTION WITH  MASTOPEXY;  Surgeon: Theodoro Kos, DO;  Location: [facility];  Service: Plastics;  Laterality: Left;  . Liposuction with lipofilling Left 10/11/2012    Procedure: LIPOSUCTION WITH LIPOFILLING;  Surgeon: Theodoro Kos, DO;  Location: [facility];  Service: Plastics;  Laterality: Left;    FAMILY HISTORY Family History  Problem Relation Age of Onset  . Alcohol abuse Mother   . Alcohol abuse Father   . Mental illness Father    the patient's father committed suicide at the age of 35. The patient's mother died at the age of 27 in the setting of alcohol abuse. The patient has one brother, 2 sisters. There is no history of breast or ovarian cancer in the family to the patient's knowledge.  GYNECOLOGIC HISTORY: Menarche age 52, first live birth age 57, the patient is [place] P2. She went through menopause approximately 1998. She did not take hormone replacement.  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 Her husband Kayla Mcgrath, currently retired, used to Sports administrator. Daughter Kayla Mcgrath lives in [place] and works as an Therapist, sports for [employer] and also at [employer] in [place]. Daughter Kayla Mcgrath is a homemaker in [place]. The patient has 6 grandchildren and one on the way. She is a Tourist information centre manager.   ADVANCED DIRECTIVES: Not in place  HEALTH MAINTENANCE: (Updated 11/01/2012) History  Substance Use Topics  . Smoking status: Former Smoker    Quit date: 06/14/1981  . Smokeless tobacco: Never Used  . Alcohol Use: No     Colonoscopy: 2009  PAP: 2012  Bone density: Sept 2014, osteopenia  Lipid panel: April 2014, Dr. Deborra Medina   Allergies  Allergen Reactions  . Latex Rash    Current Outpatient Prescriptions  Medication Sig Dispense Refill  . anastrozole (ARIMIDEX) 1 MG tablet Take 1 tablet (1 mg total) by mouth daily.  90 tablet  12  . Cholecalciferol (VITAMIN D) 2000 UNITS CAPS Take one by mouth daily      . diazepam (VALIUM) 5  MG tablet Take 5 mg by mouth every 6 (six) hours as needed for anxiety.      Marland Kitchen HYDROcodone-acetaminophen (NORCO/VICODIN) 5-325 MG per tablet Take 1 tablet by mouth every 6 (six) hours as needed for pain.       No current facility-administered medications for this visit.    OBJECTIVE: Middle-aged white woman in no acute distress Filed Vitals:   02/04/13 0935  BP: 124/29  Pulse: 93  Temp: 98.7 F (37.1 C)  Resp: 18     Body mass index is 33.35 kg/(m^2).    ECOG FS: 1 Filed Weights   02/04/13 0935  Weight: 176 lb 6.4 oz (80.015 kg)   Sclerae unicteric, pupils equal and round Oropharynx clear and moist-- dentition in good repair No cervical or supraclavicular adenopathy Lungs no rales or rhonchi Heart regular rate and rhythm Abd soft, nontender, positive bowel sounds MSK no focal spinal tenderness, no upper extremity lymphedema Neuro: nonfocal, well oriented, appropriate affect Breasts: The right breast is status post mastectomy and reconstruction. In the superior aspect of the skin of the  breast there is a 3 mm raised lesion with a central poor, which appears to be an inflamed poor. She tells that is used to be bigger and is getting smaller. There is no evidence of disease recurrence in the breast. The outline of the reconstructed breast is very irregular. The right axilla is benign. The left breast is unremarkable    LAB RESULTS: Lab Results  Component Value Date   WBC 5.1 01/28/2013   NEUTROABS 3.1 01/28/2013   HGB 14.7 01/28/2013   HCT 44.0 01/28/2013   MCV 91.6 01/28/2013   PLT 246 01/28/2013      Chemistry      Component Value Date/Time   NA 140 01/28/2013 0929   NA 139 04/30/2012 0903   K 4.6 01/28/2013 0929   K 4.5 04/30/2012 0903   CL 105 05/02/2012 0822   CL 104 04/30/2012 0903   CO2 27 01/28/2013 0929   CO2 28 04/30/2012 0903   BUN 13.7 01/28/2013 0929   BUN 16 04/30/2012 0903   CREATININE 0.8 01/28/2013 0929   CREATININE 0.8 04/30/2012 0903      Component Value  Date/Time   CALCIUM 10.6* 01/28/2013 0929   CALCIUM 10.0 04/30/2012 0903   ALKPHOS 103 01/28/2013 0929   ALKPHOS 84 04/30/2012 0903   AST 29 01/28/2013 0929   AST 28 04/30/2012 0903   ALT 25 01/28/2013 0929   ALT 26 04/30/2012 0903   BILITOT 0.42 01/28/2013 0929   BILITOT 0.8 04/30/2012 0903       STUDIES: Clinical Data: 68 year old postmenopausal female with history of  right breast cancer and vitamin D deficiency. The patient has  taken Arimidex for 1 month. She takes vitamin D.  DUAL X-RAY ABSORPTIOMETRY (DXA) FOR BONE MINERAL DENSITY  AP LUMBAR SPINE (L3 - L4)  Bone Mineral Density (BMD): 0.857 g/cm2  Young Adult T Score: -2.2  Z Score: -0.3  LEFT FEMUR (NECK)  Bone Mineral Density (BMD): 0.705 g/cm2  Young Adult T Score: -1.3  Z Score: 0.2  ASSESSMENT: Patient's diagnostic category is LOW BONE MASS by WHO  Criteria.  FRACTURE RISK: MODERATE   ASSESSMENT: 68 y.o. Kayla Mcgrath woman  (1) status post right lumpectomy December of 2010 for ductal carcinoma in situ, intermediate grade, estrogen receptor positive, with close but negative margins, the patient refusing adjuvant radiation therapy, but taking adjuvant tamoxifen approximately one year  (2) status post right breast upper inner quadrant biopsy 04/26/2012 for a clinical T1b N0, stage IA invasive ductal carcinoma, intermediate grade, estrogen receptor 100% and progesterone receptor 10% positive, with no HER-2 amplification, and an MIB-1 of 35%  (3) status post right mastectomy and sentinel lymph node sampling 06/20/2012 for a pT1b pN0, stage IA invasive ductal carcinoma, grade 2, again HER-2 negative.  (4) Oncotype DX score of 11 predicting a distant recurrence within 10 years of 8% if the patient's only adjuvant systemic treatment is tamoxifen for 5 years.  (5) began on anastrozole in July 2014, the plan being to continue for total of 5 years (until July 2019)  (6)  status post reconstruction on the right, and reduction  mammoplasty on the left on 10-14 under the care of Dr. Migdalia Dk  (7) osteopenia   PLAN:  Terease is doing fine as far as her breast cancer is concerned. I am having her meet with our financial counselor today so she can clarify exactly who her primary care physician is and we'll need to refer so she gets the best calls for whatever studies  she needs.  We again discussed osteopenia. She understands and recliner that probably are available here. She will discuss this further with Dr. Deborra Medina when they meet 3 months from now.  Otherwise the plan is to continue anastrozole at least to a total of 2 years, before considering switching to tamoxifen. Today I gave Breeanne information on the "finding your new normal" program here, and also the new program we have regarding vaginal dryness, "pelvic health".  Offie knows to call for any problems that may develop before next visit here, which will be in July.    Chauncey Cruel, MD      02/04/2013

## 2013-02-04 NOTE — Telephone Encounter (Signed)
appts made and printed...td 

## 2013-02-08 ENCOUNTER — Ambulatory Visit: Payer: Medicare HMO

## 2013-02-27 ENCOUNTER — Encounter (INDEPENDENT_AMBULATORY_CARE_PROVIDER_SITE_OTHER): Payer: Self-pay | Admitting: General Surgery

## 2013-04-15 ENCOUNTER — Ambulatory Visit: Payer: Medicare HMO

## 2013-04-22 ENCOUNTER — Ambulatory Visit (INDEPENDENT_AMBULATORY_CARE_PROVIDER_SITE_OTHER): Payer: Medicare HMO | Admitting: General Surgery

## 2013-05-06 ENCOUNTER — Other Ambulatory Visit: Payer: Self-pay | Admitting: Physician Assistant

## 2013-05-06 ENCOUNTER — Encounter (INDEPENDENT_AMBULATORY_CARE_PROVIDER_SITE_OTHER): Payer: Self-pay

## 2013-05-06 ENCOUNTER — Ambulatory Visit
Admission: RE | Admit: 2013-05-06 | Discharge: 2013-05-06 | Disposition: A | Discharge status: Home / Self Care | Payer: Commercial Managed Care - HMO | Source: Ambulatory Visit | Attending: Physician Assistant | Admitting: Physician Assistant

## 2013-05-06 DIAGNOSIS — Z1231 Encounter for screening mammogram for malignant neoplasm of breast: Secondary | ICD-10-CM

## 2013-05-13 ENCOUNTER — Ambulatory Visit (INDEPENDENT_AMBULATORY_CARE_PROVIDER_SITE_OTHER): Payer: Medicare HMO | Admitting: General Surgery

## 2013-05-16 ENCOUNTER — Other Ambulatory Visit: Payer: Self-pay | Admitting: Family Medicine

## 2013-05-16 DIAGNOSIS — E785 Hyperlipidemia, unspecified: Secondary | ICD-10-CM

## 2013-05-16 DIAGNOSIS — Z0000 Encounter for general adult medical examination without abnormal findings: Secondary | ICD-10-CM

## 2013-05-22 ENCOUNTER — Other Ambulatory Visit (INDEPENDENT_AMBULATORY_CARE_PROVIDER_SITE_OTHER): Payer: Commercial Managed Care - HMO

## 2013-05-22 DIAGNOSIS — E785 Hyperlipidemia, unspecified: Secondary | ICD-10-CM

## 2013-05-22 DIAGNOSIS — Z0000 Encounter for general adult medical examination without abnormal findings: Secondary | ICD-10-CM

## 2013-05-22 LAB — COMPREHENSIVE METABOLIC PANEL
ALBUMIN: 4 g/dL (ref 3.5–5.2)
ALK PHOS: 85 U/L (ref 39–117)
ALT: 24 U/L (ref 0–35)
AST: 27 U/L (ref 0–37)
BILIRUBIN TOTAL: 0.5 mg/dL (ref 0.2–1.2)
BUN: 15 mg/dL (ref 6–23)
CO2: 28 mEq/L (ref 19–32)
Calcium: 10.4 mg/dL (ref 8.4–10.5)
Chloride: 105 mEq/L (ref 96–112)
Creatinine, Ser: 0.8 mg/dL (ref 0.4–1.2)
GFR: 80.82 mL/min (ref >60.00)
GLUCOSE: 97 mg/dL (ref 70–99)
POTASSIUM: 4.5 meq/L (ref 3.5–5.1)
SODIUM: 140 meq/L (ref 135–145)
TOTAL PROTEIN: 7.4 g/dL (ref 6.0–8.3)

## 2013-05-22 LAB — CBC WITH DIFFERENTIAL/PLATELET
BASOS ABS: 0 10*3/uL (ref 0.0–0.1)
Basophils Relative: 0.7 % (ref 0.0–3.0)
EOS ABS: 0.1 10*3/uL (ref 0.0–0.7)
EOS PCT: 2.7 % (ref 0.0–5.0)
HCT: 43.8 % (ref 36.0–46.0)
Hemoglobin: 14.8 g/dL (ref 12.0–15.0)
LYMPHS ABS: 1.4 10*3/uL (ref 0.7–4.0)
Lymphocytes Relative: 26.1 % (ref 12.0–46.0)
MCHC: 33.8 g/dL (ref 30.0–36.0)
MCV: 91.7 fl (ref 78.0–100.0)
MONO ABS: 0.5 10*3/uL (ref 0.1–1.0)
Monocytes Relative: 8.5 % (ref 3.0–12.0)
NEUTROS PCT: 62 % (ref 43.0–77.0)
Neutro Abs: 3.4 10*3/uL (ref 1.4–7.7)
PLATELETS: 235 10*3/uL (ref 150.0–400.0)
RBC: 4.77 Mil/uL (ref 3.87–5.11)
RDW: 13 % (ref 11.5–15.5)
WBC: 5.5 10*3/uL (ref 4.0–10.5)

## 2013-05-22 LAB — LIPID PANEL
CHOL/HDL RATIO: 4
Cholesterol: 252 mg/dL — ABNORMAL HIGH (ref 0–200)
HDL: 65.8 mg/dL (ref >39.00)
LDL CALC: 167 mg/dL — AB (ref 0–99)
Triglycerides: 94 mg/dL (ref 0.0–149.0)
VLDL: 18.8 mg/dL (ref 0.0–40.0)

## 2013-05-22 LAB — TSH: TSH: 1.06 u[IU]/mL (ref 0.35–4.50)

## 2013-05-23 ENCOUNTER — Other Ambulatory Visit: Payer: Medicare HMO

## 2013-05-30 ENCOUNTER — Ambulatory Visit (INDEPENDENT_AMBULATORY_CARE_PROVIDER_SITE_OTHER): Payer: Commercial Managed Care - HMO | Admitting: Family Medicine

## 2013-05-30 ENCOUNTER — Encounter: Payer: Self-pay | Admitting: Family Medicine

## 2013-05-30 VITALS — BP 122/80 | HR 77 | Temp 98.2°F | Ht 60.25 in | Wt 175.2 lb

## 2013-05-30 DIAGNOSIS — F4323 Adjustment disorder with mixed anxiety and depressed mood: Secondary | ICD-10-CM

## 2013-05-30 DIAGNOSIS — Z1211 Encounter for screening for malignant neoplasm of colon: Secondary | ICD-10-CM

## 2013-05-30 DIAGNOSIS — E785 Hyperlipidemia, unspecified: Secondary | ICD-10-CM

## 2013-05-30 DIAGNOSIS — Z23 Encounter for immunization: Secondary | ICD-10-CM

## 2013-05-30 DIAGNOSIS — C50219 Malignant neoplasm of upper-inner quadrant of unspecified female breast: Secondary | ICD-10-CM

## 2013-05-30 DIAGNOSIS — Z66 Do not resuscitate: Secondary | ICD-10-CM

## 2013-05-30 DIAGNOSIS — Z0000 Encounter for general adult medical examination without abnormal findings: Secondary | ICD-10-CM

## 2013-05-30 DIAGNOSIS — G4700 Insomnia, unspecified: Secondary | ICD-10-CM

## 2013-05-30 MED ORDER — TRAZODONE HCL 50 MG PO TABS: 25.0000 mg | ORAL_TABLET | Freq: Every evening | ORAL | Status: DC | PRN

## 2013-05-30 NOTE — Patient Instructions (Signed)
Work on [employer], we can recheck your cholesterol in 3-6 months.  We will call you with your colonoscopy appointment.  We are starting Trazodone 25-50 mg nightly as needed. Ok to still take OTC benadryl and or melatonin.

## 2013-05-30 NOTE — Assessment & Plan Note (Signed)
Deteriorated. She prefers to try diet and recheck labs in 3 months. If remains elevated, agrees to try pravachol.

## 2013-05-30 NOTE — Addendum Note (Signed)
Addended by: Modena Nunnery on: 05/30/2013 10:14 AM   Modules accepted: Orders

## 2013-05-30 NOTE — Progress Notes (Signed)
Pre visit review using our clinic review tool, if applicable. No additional management support is needed unless otherwise documented below in the visit note. 

## 2013-05-30 NOTE — Assessment & Plan Note (Signed)
The patients weight, height, BMI and visual acuity have been recorded in the chart I have made referrals, counseling and provided education to the patient based review of the above and I have provided the pt with a written personalized care plan for preventive services.  Colonoscopy referral placed. Prevnar 13 and Td given today.

## 2013-05-30 NOTE — Assessment & Plan Note (Signed)
The problem of recurrent insomnia is discussed. Avoidance of caffeine sources is strongly encouraged. Sleep hygiene issues are reviewed. Will start Trazodone prn, also could use melatonin, antihistamines.

## 2013-05-30 NOTE — Progress Notes (Signed)
Subjective:    Patient ID: Kayla Mcgrath, female    DOB: 11-Sep-1945, 68 y.o.   MRN: [id number]  HPI  68 yo pleasant female here for initial medicare visit.  I have personally reviewed the Medicare Annual Wellness questionnaire and have noted 1. The patient's medical and social history 2. Their use of alcohol, tobacco or illicit drugs 3. Their current medications and supplements 4. The patient's functional ability including ADL's, fall risks, home safety risks and hearing or visual             impairment. 5. Diet and physical activities 6. Evidence for depression or mood disorders  End of life wishes discussed and updated in Social History/advance directives.  Breast CA- doing well. S/p mastectomy after recurrence last year.  Last saw Dr. Griffith Citron in 01/2013.  Note reviewed.  On Arimidex currently, may switch to tamoxifen.   Mammogram neg 05/08/13. Pneumovax 05/07/12 Zostavax 05/07/12  Has had more difficulty sleeping.  Benadryl was helping but no longer effective.  Has had some loss- loss of her breast (s/p mastectomy) which she did grieve. Also loss of her dog and her friend who lives in [place].  But she denies feeling depressed or anxious- feels normal grief.  HLD-  LDL elevated.  Previously on simvastatin- caused myalgais. Lab Results  Component Value Date   CHOL 252* 05/22/2013   HDL 65.80 05/22/2013   LDLCALC 167* 05/22/2013   LDLDIRECT 161.9 04/30/2012   TRIG 94.0 05/22/2013   CHOLHDL 4 05/22/2013   Lab Results  Component Value Date   NA 140 05/22/2013   K 4.5 05/22/2013   CL 105 05/22/2013   CO2 28 05/22/2013   Lab Results  Component Value Date   CREATININE 0.8 05/22/2013   Wt Readings from Last 3 Encounters:  05/30/13 175 lb 4 oz (79.493 kg)  02/04/13 176 lb 6.4 oz (80.015 kg)  11/01/12 178 lb (80.74 kg)    Patient Active Problem List   Diagnosis Date Noted  . Medicare annual wellness visit, initial 05/07/2012  . HLD (hyperlipidemia) 05/07/2012  . DNR (do not  resuscitate) 05/07/2012  . Adjustment disorder with mixed anxiety and depressed mood 05/07/2012  . Cancer of upper-inner quadrant of female breast 04/27/2012   Past Medical History  Diagnosis Date  . DCIS (ductal carcinoma in situ) of breast 2010  . Hyperlipidemia   . Breast cancer   . Skin cancer   . Joint pain   . Wears glasses   . Full dentures    Past Surgical History  Procedure Laterality Date  . Right lumpectomy  december 2010    cancer-tamoxifen  . Dilation and curettage of uterus    . Colonoscopy    . Mastectomy w/ sentinel node biopsy Right 06/20/2012    Procedure: right skin sparing MASTECTOMY WITH SENTINEL LYMPH NODE BIOPSY;  Surgeon: Stark Klein, MD;  Location: [place];  Service: General;  Laterality: Right;  . Breast reconstruction with placement of tissue expander and flex hd (acellular hydrated dermis) Right 06/20/2012    Procedure: BREAST RECONSTRUCTION WITH PLACEMENT OF TISSUE EXPANDER AND FLEX HD (ACELLULAR HYDRATED DERMIS);  Surgeon: Theodoro Kos, DO;  Location: [place];  Service: Plastics;  Laterality: Right;  . Removal of tissue expander and placement of implant Right 10/11/2012    Procedure: REMOVAL OF TISSUE EXPANDER AND PLACEMENT OF SILICONE IMPLANT RIGHT;  Surgeon: Theodoro Kos, DO;  Location: [place];  Service: Plastics;  Laterality: Right;  . Breast  reduction with mastopexy Left 10/11/2012    Procedure: LEFT BREAST REDUCTION WITH MASTOPEXY;  Surgeon: Theodoro Kos, DO;  Location: [place];  Service: Plastics;  Laterality: Left;  . Liposuction with lipofilling Left 10/11/2012    Procedure: LIPOSUCTION WITH LIPOFILLING;  Surgeon: Theodoro Kos, DO;  Location: [place];  Service: Plastics;  Laterality: Left;   History  Substance Use Topics  . Smoking status: Former Smoker    Quit date: 06/14/1981  . Smokeless tobacco: Never Used  . Alcohol Use: No   Family History   Problem Relation Age of Onset  . Alcohol abuse Mother   . Alcohol abuse Father   . Mental illness Father    Allergies  Allergen Reactions  . Latex Rash   Current Outpatient Prescriptions on File Prior to Visit  Medication Sig Dispense Refill  . anastrozole (ARIMIDEX) 1 MG tablet Take 1 tablet (1 mg total) by mouth daily.  90 tablet  12  . Cholecalciferol (VITAMIN D) 2000 UNITS CAPS Take one by mouth daily       No current facility-administered medications on file prior to visit.   The PMH, PSH, Social History, Family History, Medications, and allergies have been reviewed in [facility], and have been updated if relevant.   Review of Systems See HPI     Patient reports no  vision/ hearing changes,anorexia, weight change, fever ,adenopathy, persistant / recurrent hoarseness, swallowing issues, chest pain, edema,persistant / recurrent cough, hemoptysis, dyspnea(rest, exertional, paroxysmal nocturnal), gastrointestinal  bleeding (melena, rectal bleeding), abdominal pain, excessive heart burn, GU symptoms(dysuria, hematuria, pyuria, voiding/incontinence  Issues) syncope, focal weakness, severe memory loss, concerning skin lesions, depression, anxiety, abnormal bruising/bleeding, major joint swelling, breast masses or abnormal vaginal bleeding.    Objective:   Physical Exam BP 122/80  Pulse 77  Temp(Src) 98.2 F (36.8 C) (Oral)  Ht 5' 0.25" (1.53 m)  Wt 175 lb 4 oz (79.493 kg)  BMI 33.96 kg/m2  SpO2 97%  General:  Well-developed,well-nourished,in no acute distress; alert,appropriate and cooperative throughout examination Head:  normocephalic and atraumatic.   Eyes:  vision grossly intact, pupils equal, pupils round, and pupils reactive to light.   Ears:  R ear normal and L ear normal.   Nose:  no external deformity.   Mouth:  good dentition.   Neck:  No deformities, masses, or tenderness noted. Lungs:  Normal respiratory effort, chest expands symmetrically. Lungs are clear to  auscultation, no crackles or wheezes. Heart:  Normal rate and regular rhythm. S1 and S2 normal without gallop, murmur, click, rub or other extra sounds. Abdomen:  Bowel sounds positive,abdomen soft and non-tender without masses, organomegaly or hernias noted. Msk:  No deformity or scoliosis noted of thoracic or lumbar spine.   Extremities:  No clubbing, cyanosis, edema, or deformity noted with normal full range of motion of all joints.   Neurologic:  alert & oriented X3 and gait normal.   Skin:  Intact without suspicious lesions or rashes Cervical Nodes:  No lymphadenopathy noted Axillary Nodes:  No palpable lymphadenopathy Psych:  Cognition and judgment appear intact. Alert and cooperative with normal attention span and concentration. No apparent delusions, illusions, hallucinations        Assessment & Plan:

## 2013-06-05 ENCOUNTER — Encounter: Payer: Self-pay | Admitting: Internal Medicine

## 2013-06-18 ENCOUNTER — Ambulatory Visit (INDEPENDENT_AMBULATORY_CARE_PROVIDER_SITE_OTHER): Payer: Commercial Managed Care - HMO | Admitting: Internal Medicine

## 2013-06-18 ENCOUNTER — Encounter: Payer: Self-pay | Admitting: Internal Medicine

## 2013-06-18 VITALS — BP 128/76 | HR 88 | Ht 60.0 in | Wt 177.1 lb

## 2013-06-18 DIAGNOSIS — Z1211 Encounter for screening for malignant neoplasm of colon: Secondary | ICD-10-CM

## 2013-06-18 MED ORDER — MOVIPREP 100 G PO SOLR: 1.0000 | Freq: Once | ORAL | Status: DC

## 2013-06-18 NOTE — Progress Notes (Signed)
Kayla Mcgrath [birth year] [id number]  Note: This dictation was prepared with Dragon digital system. Any transcriptional errors that result from this procedure are unintentional.   History of Present Illness:  This is a 69 year old white female who is here to discuss a screening colonoscopy. She is asymptomatic. She had a colonoscopy in [place] about 8 years ago but we do not have the report. She thinks that she had a polyp. There is no family history of colon cancer. She had breast cancer in 2010 treated with a lumpectomy. She had a recurrence in the right breast in 2014 and had a mastectomy. She has been followed by Dr.Magrinat. She denies rectal bleeding, abdominal pain or weight loss.     Past Medical History  Diagnosis Date  . DCIS (ductal carcinoma in situ) of breast 2010  . Hyperlipidemia   . Breast cancer   . Skin cancer   . Joint pain   . Wears glasses   . Full dentures   . Colon polyps     ?  Marland Kitchen Diverticulosis     Past Surgical History  Procedure Laterality Date  . Breast lumpectomy Right december 2010    cancer-tamoxifen  . Dilation and curettage of uterus    . Colonoscopy    . Mastectomy w/ sentinel node biopsy Right 06/20/2012    Procedure: right skin sparing MASTECTOMY WITH SENTINEL LYMPH NODE BIOPSY;  Surgeon: Stark Klein, MD;  Location: [place];  Service: General;  Laterality: Right;  . Breast reconstruction with placement of tissue expander and flex hd (acellular hydrated dermis) Right 06/20/2012    Procedure: BREAST RECONSTRUCTION WITH PLACEMENT OF TISSUE EXPANDER AND FLEX HD (ACELLULAR HYDRATED DERMIS);  Surgeon: Theodoro Kos, DO;  Location: [place];  Service: Plastics;  Laterality: Right;  . Removal of tissue expander and placement of implant Right 10/11/2012    Procedure: REMOVAL OF TISSUE EXPANDER AND PLACEMENT OF SILICONE IMPLANT RIGHT;  Surgeon: Theodoro Kos, DO;  Location: [place];  Service: Plastics;   Laterality: Right;  . Breast reduction with mastopexy Left 10/11/2012    Procedure: LEFT BREAST REDUCTION WITH MASTOPEXY;  Surgeon: Theodoro Kos, DO;  Location: [place];  Service: Plastics;  Laterality: Left;  . Liposuction with lipofilling Left 10/11/2012    Procedure: LIPOSUCTION WITH LIPOFILLING;  Surgeon: Theodoro Kos, DO;  Location: [place];  Service: Plastics;  Laterality: Left;    Allergies  Allergen Reactions  . Latex Rash    Family history and social history have been reviewed.  Review of Systems: Negative for heartburn dysphagia  The remainder of the 10 point ROS is negative except as outlined in the H&P  Physical Exam: General Appearance Well developed, in no distressMildly overweight  Eyes  Non icteric  HEENT  Non traumatic, normocephalic  Mouth No lesion, tongue papillated, no cheilosis Neck Supple without adenopathy, thyroid not enlarged, no carotid bruits, no JVD Lungs Clear to auscultation bilaterally COR Normal S1, normal S2, regular rhythm, no murmur, quiet precordium Abdomen Soft abdomen with normoactive bowel sounds. No distention. Liver edge at costal margin. No scars  Rectal Normal rectal sphincter tone. Hemoccult-negative stool  Extremities  No pedal edema Skin No lesions Neurological Alert and oriented x 3 Psychological Normal mood and affect  Assessment and Plan:   Problem #75 68 year old white female who is asymptomatic and is a good candidate for a screening colonoscopy. Her last exam was more than 8 years ago in [place]. There was  a possible polyp found. No records are available. We have discussed the prep, sedation as well as the procedure itself. She felt that she was not adequately sedated for the last colonoscopy. I assured her that we use propofol and that her sedation will be complete this time.    [place] Dragon 06/18/2013

## 2013-06-18 NOTE — Patient Instructions (Addendum)
You have been scheduled for a colonoscopy with propofol. Please follow written instructions given to you at your visit today.  Please pick up your prep kit at the pharmacy within the next 1-3 days. If you use inhalers (even only as needed), please bring them with you on the day of your procedure. Your physician has requested that you go to www.[email or web address].com and enter the access code given to you at your visit today. This web site gives a general overview about your procedure. However, you should still follow specific instructions given to you by our office regarding your preparation for the procedure.  CC: Dr Deborra Medina, Dr Jana Hakim

## 2013-06-19 ENCOUNTER — Telehealth: Payer: Self-pay | Admitting: Internal Medicine

## 2013-06-19 MED ORDER — MOVIPREP 100 G PO SOLR: 1.0000 | Freq: Once | ORAL | Status: DC

## 2013-06-19 NOTE — Telephone Encounter (Signed)
Rx is $102.00 and too expensive for patient. Sample placed at front desk for patient pick up. Patient verbalizes understanding.

## 2013-07-17 ENCOUNTER — Encounter: Payer: Commercial Managed Care - HMO | Admitting: Internal Medicine

## 2013-07-26 ENCOUNTER — Other Ambulatory Visit: Payer: Self-pay | Admitting: *Deleted

## 2013-07-26 DIAGNOSIS — C50219 Malignant neoplasm of upper-inner quadrant of unspecified female breast: Secondary | ICD-10-CM

## 2013-07-29 ENCOUNTER — Telehealth: Payer: Self-pay | Admitting: Oncology

## 2013-07-29 ENCOUNTER — Other Ambulatory Visit (HOSPITAL_BASED_OUTPATIENT_CLINIC_OR_DEPARTMENT_OTHER): Payer: Commercial Managed Care - HMO

## 2013-07-29 DIAGNOSIS — C50219 Malignant neoplasm of upper-inner quadrant of unspecified female breast: Secondary | ICD-10-CM

## 2013-07-29 LAB — CBC WITH DIFFERENTIAL/PLATELET
BASO%: 1.1 % (ref 0.0–2.0)
Basophils Absolute: 0.1 10*3/uL (ref 0.0–0.1)
EOS ABS: 0.2 10*3/uL (ref 0.0–0.5)
EOS%: 4.3 % (ref 0.0–7.0)
HCT: 43 % (ref 34.8–46.6)
HGB: 14.1 g/dL (ref 11.6–15.9)
LYMPH%: 30.2 % (ref 14.0–49.7)
MCH: 30.2 pg (ref 25.1–34.0)
MCHC: 32.7 g/dL (ref 31.5–36.0)
MCV: 92.4 fL (ref 79.5–101.0)
MONO#: 0.6 10*3/uL (ref 0.1–0.9)
MONO%: 11.3 % (ref 0.0–14.0)
NEUT%: 53.1 % (ref 38.4–76.8)
NEUTROS ABS: 2.7 10*3/uL (ref 1.5–6.5)
Platelets: 220 10*3/uL (ref 145–400)
RBC: 4.66 10*6/uL (ref 3.70–5.45)
RDW: 12.8 % (ref 11.2–14.5)
WBC: 5.1 10*3/uL (ref 3.9–10.3)
lymph#: 1.5 10*3/uL (ref 0.9–3.3)

## 2013-07-29 LAB — COMPREHENSIVE METABOLIC PANEL (CC13)
ALK PHOS: 86 U/L (ref 40–150)
ALT: 24 U/L (ref 0–55)
ANION GAP: 8 meq/L (ref 3–11)
AST: 28 U/L (ref 5–34)
Albumin: 3.6 g/dL (ref 3.5–5.0)
BILIRUBIN TOTAL: 0.48 mg/dL (ref 0.20–1.20)
BUN: 15.6 mg/dL (ref 7.0–26.0)
CO2: 28 meq/L (ref 22–29)
Calcium: 10.2 mg/dL (ref 8.4–10.4)
Chloride: 105 mEq/L (ref 98–109)
Creatinine: 0.8 mg/dL (ref 0.6–1.1)
GLUCOSE: 64 mg/dL — AB (ref 70–140)
POTASSIUM: 4.8 meq/L (ref 3.5–5.1)
SODIUM: 141 meq/L (ref 136–145)
Total Protein: 7 g/dL (ref 6.4–8.3)

## 2013-07-29 NOTE — Telephone Encounter (Signed)
moved 7/27 appt to 7/22 w/KC - s/w pt she is aware.

## 2013-07-31 ENCOUNTER — Encounter: Payer: Self-pay | Admitting: Oncology

## 2013-07-31 ENCOUNTER — Ambulatory Visit (HOSPITAL_BASED_OUTPATIENT_CLINIC_OR_DEPARTMENT_OTHER): Payer: Commercial Managed Care - HMO | Admitting: Oncology

## 2013-07-31 VITALS — BP 149/78 | HR 72 | Temp 98.5°F | Resp 18 | Ht 60.0 in | Wt 177.3 lb

## 2013-07-31 DIAGNOSIS — C50219 Malignant neoplasm of upper-inner quadrant of unspecified female breast: Secondary | ICD-10-CM

## 2013-07-31 DIAGNOSIS — Z170 Estrogen receptor positive status [ER+]: Secondary | ICD-10-CM

## 2013-07-31 NOTE — Progress Notes (Signed)
ID: Kayla Mcgrath   DOB: 1945-01-19  MR#: [id number]  INO#:[id number]  PCP: Arnette Norris, MD GYN:  SUStark Klein OTHER MD: [name]; [name] [phone]), Theodoro Kos  CHIEF COMPLAINT:  Right Breast Cancer   HISTORY OF PRESENT ILLNESS: Kayla Mcgrath underwent right lumpectomy in December of 2010 for a 1.2 cm ductal carcinoma in situ, intermediate grade, with close but negative margins (1 mm). I do not have the prognostic panel, but per patient and history the tumor was estrogen receptor positive and Jaryiah took tamoxifen for approximately one year. She declined radiation.  She was then followed closely, with mammography every 6 months. On 03/08/2012 a new area of amorphous calcifications measuring 2 mm were noted at the 9:00 position of the right breast. There was also an isodense oval mass measuring 7 mm at the 2:00 position in the same breast. Ultrasound of the right breast confirmed a 6 mm oval hypoechoic mass with microlobulated margins.  The patient was referred to the breast Center for biopsy, and on for 15 2014 underwent biopsy of the small area of calcifications in the right breast as well as the breast mass in the same breast. These showed ([id number]) invasive ductal carcinoma, grade 2 or 3, estrogen receptor 100% positive, progesterone receptor 10% positive, with an MIB-1 of 35%, and no HER-2 amplification. The small area of calcification showed only atypical ductal hyperplasia.  The patient's subsequent history is as detailed below.  INTERVAL HISTORY: Kayla Mcgrath returns today for followup of her breast cancer. She continues to take Anastrazole 1 mg daily.  REVIEW OF SYSTEMS: Kayla Mcgrath is tolerating the anastrozole with no problems with hot flashes. She does have vaginal dryness. This is not clearly worse than before, but it certainly is not better. She has tried multiple OTC treatments without much success. She was given information about the pelvic health program, but has not  yet attended a session. She has mild, intermittent arthralgias to her knees. She takes Tylenol on an intermittent basis for this. Otherwise a detailed review of systems today was noncontributory   PAST MEDICAL HISTORY: Past Medical History  Diagnosis Date  . DCIS (ductal carcinoma in situ) of breast 2010  . Hyperlipidemia   . Breast cancer   . Skin cancer   . Joint pain   . Wears glasses   . Full dentures   . Colon polyps     ?  Marland Kitchen Diverticulosis     PAST SURGICAL HISTORY: Past Surgical History  Procedure Laterality Date  . Breast lumpectomy Right december 2010    cancer-tamoxifen  . Dilation and curettage of uterus    . Colonoscopy    . Mastectomy w/ sentinel node biopsy Right 06/20/2012    Procedure: right skin sparing MASTECTOMY WITH SENTINEL LYMPH NODE BIOPSY;  Surgeon: Stark Klein, MD;  Location: [place];  Service: General;  Laterality: Right;  . Breast reconstruction with placement of tissue expander and flex hd (acellular hydrated dermis) Right 06/20/2012    Procedure: BREAST RECONSTRUCTION WITH PLACEMENT OF TISSUE EXPANDER AND FLEX HD (ACELLULAR HYDRATED DERMIS);  Surgeon: Theodoro Kos, DO;  Location: [place];  Service: Plastics;  Laterality: Right;  . Removal of tissue expander and placement of implant Right 10/11/2012    Procedure: REMOVAL OF TISSUE EXPANDER AND PLACEMENT OF SILICONE IMPLANT RIGHT;  Surgeon: Theodoro Kos, DO;  Location: [place];  Service: Plastics;  Laterality: Right;  . Breast reduction with mastopexy Left 10/11/2012  Procedure: LEFT BREAST REDUCTION WITH MASTOPEXY;  Surgeon: Theodoro Kos, DO;  Location: [place];  Service: Plastics;  Laterality: Left;  . Liposuction with lipofilling Left 10/11/2012    Procedure: LIPOSUCTION WITH LIPOFILLING;  Surgeon: Theodoro Kos, DO;  Location: [place];  Service: Plastics;  Laterality: Left;    FAMILY HISTORY Family History   Problem Relation Age of Onset  . Alcohol abuse Mother   . Alcohol abuse Father     suicide  . Mental illness Father   . Colon polyps Mother   . Heart disease Paternal Grandfather   . Mental illness Mother    the patient's father committed suicide at the age of 72. The patient's mother died at the age of 42 in the setting of alcohol abuse. The patient has one brother, 2 sisters. There is no history of breast or ovarian cancer in the family to the patient's knowledge.  GYNECOLOGIC HISTORY: Menarche age 67, first live birth age 23, the patient is Kayla Mcgrath. She went through menopause approximately 1998. She did not take hormone replacement.  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 Her husband Kayla Mcgrath, currently retired, used to Sports administrator. Daughter Kayla Mcgrath lives in [place] and works as an Therapist, sports for [employer] and also at [employer] in [place]. Daughter Kayla Mcgrath is a homemaker in [place]. The patient has 6 grandchildren and one on the way. She is a Tourist information centre manager.   ADVANCED DIRECTIVES: Not in place  HEALTH MAINTENANCE: (Updated 11/01/2012) History  Substance Use Topics  . Smoking status: Former Smoker    Types: Cigarettes    Quit date: 06/14/1981  . Smokeless tobacco: Never Used  . Alcohol Use: No     Colonoscopy: 2009  PAP: 2012  Bone density: Sept 2014, osteopenia  Lipid panel: April 2014, Dr. Deborra Medina   Allergies  Allergen Reactions  . Latex Rash    Current Outpatient Prescriptions  Medication Sig Dispense Refill  . anastrozole (ARIMIDEX) 1 MG tablet Take 1 tablet (1 mg total) by mouth daily.  90 tablet  12  . Cholecalciferol (VITAMIN D) 2000 UNITS CAPS Take one by mouth daily      . MOVIPREP 100 G SOLR Take 1 kit (200 g total) by mouth once.  1 kit  0  . traZODone (DESYREL) 50 MG tablet Take 0.5-1 tablets (25-50 mg total) by mouth at bedtime as needed for sleep.  30 tablet  3  . triamcinolone cream (KENALOG) 0.1 % Apply 1  application topically as needed.       No current facility-administered medications for this visit.    OBJECTIVE: Middle-aged white woman in no acute distress Filed Vitals:   07/31/13 0853  BP: 149/78  Pulse: 72  Temp: 98.5 F (36.9 C)  Resp: 18     Body mass index is 34.63 kg/(m^2).    ECOG FS: 1 Filed Weights   07/31/13 0853  Weight: 177 lb 4.8 oz (80.423 kg)   Sclerae unicteric, pupils equal and round Oropharynx clear and moist-- dentition in good repair No cervical or supraclavicular adenopathy Lungs no rales or rhonchi Heart regular rate and rhythm Abd soft, nontender, positive bowel sounds MSK no focal spinal tenderness, no upper extremity lymphedema Neuro: nonfocal, well oriented, appropriate affect Breasts: The right breast is status post mastectomy and reconstruction. In the superior aspect of the skin of the breast there is a 3 mm raised lesion with a central poor, which appears to be  an inflamed poor. She tells that is used to be bigger and is getting smaller. There is no evidence of disease recurrence in the breast. The outline of the reconstructed breast is very irregular. The right axilla is benign. The left breast is unremarkable    LAB RESULTS: Lab Results  Component Value Date   WBC 5.1 07/29/2013   NEUTROABS 2.7 07/29/2013   HGB 14.1 07/29/2013   HCT 43.0 07/29/2013   MCV 92.4 07/29/2013   PLT 220 07/29/2013      Chemistry      Component Value Date/Time   NA 141 07/29/2013 0920   NA 140 05/22/2013 0847   K 4.8 07/29/2013 0920   K 4.5 05/22/2013 0847   CL 105 05/22/2013 0847   CL 105 05/02/2012 0822   CO2 28 07/29/2013 0920   CO2 28 05/22/2013 0847   BUN 15.6 07/29/2013 0920   BUN 15 05/22/2013 0847   CREATININE 0.8 07/29/2013 0920   CREATININE 0.8 05/22/2013 0847      Component Value Date/Time   CALCIUM 10.2 07/29/2013 0920   CALCIUM 10.4 05/22/2013 0847   ALKPHOS 86 07/29/2013 0920   ALKPHOS 85 05/22/2013 0847   AST 28 07/29/2013 0920   AST 27 05/22/2013 0847    ALT 24 07/29/2013 0920   ALT 24 05/22/2013 0847   BILITOT 0.48 07/29/2013 0920   BILITOT 0.5 05/22/2013 0847       STUDIES: Clinical Data: 68 year old postmenopausal female with history of  right breast cancer and vitamin D deficiency. The patient has  taken Arimidex for 1 month. She takes vitamin D.  DUAL X-RAY ABSORPTIOMETRY (DXA) FOR BONE MINERAL DENSITY  AP LUMBAR SPINE (L3 - L4)  Bone Mineral Density (BMD): 0.857 g/cm2  Young Adult T Score: -2.2  Z Score: -0.3  LEFT FEMUR (NECK)  Bone Mineral Density (BMD): 0.705 g/cm2  Young Adult T Score: -1.3  Z Score: 0.2  ASSESSMENT: Patient's diagnostic category is LOW BONE MASS by WHO  Criteria.  FRACTURE RISK: MODERATE   ASSESSMENT: 68 y.o. Kayla Mcgrath woman  (1) status post right lumpectomy December of 2010 for ductal carcinoma in situ, intermediate grade, estrogen receptor positive, with close but negative margins, the patient refusing adjuvant radiation therapy, but taking adjuvant tamoxifen approximately one year  (2) status post right breast upper inner quadrant biopsy 04/26/2012 for a clinical T1b N0, stage IA invasive ductal carcinoma, intermediate grade, estrogen receptor 100% and progesterone receptor 10% positive, with no HER-2 amplification, and an MIB-1 of 35%  (3) status post right mastectomy and sentinel lymph node sampling 06/20/2012 for a pT1b pN0, stage IA invasive ductal carcinoma, grade 2, again HER-2 negative.  (4) Oncotype DX score of 11 predicting a distant recurrence within 10 years of 8% if the patient's only adjuvant systemic treatment is tamoxifen for 5 years.  (5) began on anastrozole in July 2014, the plan being to continue for total of 5 years (until July 2019)  (6)  status post reconstruction on the right, and reduction mammoplasty on the left on 10-14 under the care of Dr. Migdalia Dk  (7) osteopenia   PLAN:  Karmel is doing fine as far as her breast cancer is concerned. Mammogram was normal in April  2015. She will continue Anastrazole. Mammogram will be due again in April 2016.  She has osteopenia. Continue Calcium and Vitamin D. Repeat DEXA due in 09/2014.  Otherwise the plan is to continue anastrozole at least to a total of 2 years, before considering switching  to tamoxifen. Today I gave Alexiz information on the "pelvic health" program and a sample of Luvena to try.   Jalacia knows to call for any problems that may develop before next visit here in 6 months.    Mikey Bussing, NP      07/31/2013

## 2013-08-01 ENCOUNTER — Telehealth: Payer: Self-pay | Admitting: Oncology

## 2013-08-01 NOTE — Telephone Encounter (Signed)
lvm for pt regarding to Jan 2016 appt....mailed pt appt sched/avs and letter °

## 2013-08-05 ENCOUNTER — Ambulatory Visit: Payer: Medicare HMO | Admitting: Nurse Practitioner

## 2013-08-07 ENCOUNTER — Encounter: Payer: Commercial Managed Care - HMO | Admitting: Internal Medicine

## 2013-08-22 ENCOUNTER — Other Ambulatory Visit: Payer: Commercial Managed Care - HMO

## 2013-08-28 ENCOUNTER — Ambulatory Visit (AMBULATORY_SURGERY_CENTER): Payer: Commercial Managed Care - HMO | Admitting: Internal Medicine

## 2013-08-28 ENCOUNTER — Encounter: Payer: Self-pay | Admitting: Internal Medicine

## 2013-08-28 VITALS — BP 142/74 | HR 63 | Temp 98.4°F | Resp 27 | Ht 60.0 in | Wt 177.0 lb

## 2013-08-28 DIAGNOSIS — D126 Benign neoplasm of colon, unspecified: Secondary | ICD-10-CM

## 2013-08-28 DIAGNOSIS — Z1211 Encounter for screening for malignant neoplasm of colon: Secondary | ICD-10-CM

## 2013-08-28 HISTORY — PX: COLONOSCOPY: SHX174

## 2013-08-28 MED ORDER — SODIUM CHLORIDE 0.9 % IV SOLN: 500.0000 mL | INTRAVENOUS | Status: DC

## 2013-08-28 NOTE — Op Note (Addendum)
[place]  [employer], [id number]   COLONOSCOPY PROCEDURE REPORT  PATIENT: Kayla, Mcgrath  MR#: [id number] BIRTHDATE: 05/26/1945 , 67  yrs. old GENDER: Female ENDOSCOPIST: Lafayette Dragon, MD REFERRED QM:GQQPY Aron, M.D. PROCEDURE DATE:  08/28/2013 PROCEDURE:   Colonoscopy with snare polypectomy First Screening Colonoscopy - Avg.  risk and is 50 yrs.  old or older - No.  Prior Negative Screening - Now for repeat screening. N/A  History of Adenoma - Now for follow-up colonoscopy & has been > or = to 3 yrs.  Yes hx of adenoma.  Has been 3 or more years since last colonoscopy.  Polyps Removed Today? Yes. ASA CLASS:   Class II INDICATIONS:Average risk patient for colon cancer and prior colonoscopy about 8 years ago I very tenia..  We don't have that report the patient was told to have had polyps removed. MEDICATIONS: MAC sedation, administered by CRNA and propofol (Diprivan) 200mg  IV  DESCRIPTION OF PROCEDURE:   After the risks benefits and alternatives of the procedure were thoroughly explained, informed consent was obtained.  A digital rectal exam revealed no abnormalities of the rectum.   The [id number]  endoscope was introduced through the anus and advanced to the cecum, which was identified by both the appendix and ileocecal valve. No adverse events experienced.   The quality of the prep was good, using MoviPrep  The instrument was then slowly withdrawn as the colon was fully examined.      COLON FINDINGS: Two sessile polyps ranging between 3-79mm in size were found in the descending colon and ascending colon.  A polypectomy was performed with a cold snare.  The resection was complete and the polyp tissue was completely retrieved.   Mild diverticulosis was noted in the sigmoid colon.  Retroflexed views revealed no abnormalities. The time to cecum=5 minutes 28 seconds. Withdrawal time=6 minutes 27 seconds.  The scope was withdrawn  and the procedure completed. COMPLICATIONS: There were no complications.  ENDOSCOPIC IMPRESSION: 1.   Two sessile polyps ranging between 3-60mm in size were found in the descending colon at 70  cm and ascending colon; polypectomy was performed with a cold snare 2.   Mild diverticulosis was noted in the sigmoid colon  RECOMMENDATIONS: 1.  Await biopsy results 2.  high-fiber diet Recall colonoscopy pending path report   eSigned:  Lafayette Dragon, MD 08/28/2013 3:51 PM   cc:

## 2013-08-28 NOTE — Progress Notes (Signed)
Report to PACU, RN, vss, BBS= Clear.  

## 2013-08-28 NOTE — Progress Notes (Signed)
Called to room to assist during endoscopic procedure.  Patient ID and intended procedure confirmed with present staff. Received instructions for my participation in the procedure from the performing physician.  

## 2013-08-28 NOTE — Patient Instructions (Addendum)
YOU HAD AN ENDOSCOPIC PROCEDURE TODAY AT THE [facility]: Refer to the procedure report that was given to you for any specific questions about what was found during the examination.  If the procedure report does not answer your questions, please call your gastroenterologist to clarify.  If you requested that your care partner not be given the details of your procedure findings, then the procedure report has been included in a sealed envelope for you to review at your convenience later.  YOU SHOULD EXPECT: Some feelings of bloating in the abdomen. Passage of more gas than usual.  Walking can help get rid of the air that was put into your GI tract during the procedure and reduce the bloating. If you had a lower endoscopy (such as a colonoscopy or flexible sigmoidoscopy) you may notice spotting of blood in your stool or on the toilet paper. If you underwent a bowel prep for your procedure, then you may not have a normal bowel movement for a few days.  DIET: Your first meal following the procedure should be a light meal and then it is ok to progress to your normal diet.  A half-sandwich or bowl of soup is an example of a good first meal.  Heavy or fried foods are harder to digest and may make you feel nauseous or bloated.  Likewise meals heavy in dairy and vegetables can cause extra gas to form and this can also increase the bloating.  Drink plenty of fluids but you should avoid alcoholic beverages for 24 hours.  ACTIVITY: Your care partner should take you home directly after the procedure.  You should plan to take it easy, moving slowly for the rest of the day.  You can resume normal activity the day after the procedure however you should NOT DRIVE or use heavy machinery for 24 hours (because of the sedation medicines used during the test).    SYMPTOMS TO REPORT IMMEDIATELY: A gastroenterologist can be reached at any hour.  During normal business hours, 8:30 AM to 5:00 PM Monday through Friday,  call [phone].  After hours and on weekends, please call the GI answering service at [phone] who will take a message and have the physician on call contact you.   Following lower endoscopy (colonoscopy or flexible sigmoidoscopy):  Excessive amounts of blood in the stool  Significant tenderness or worsening of abdominal pains  Swelling of the abdomen that is new, acute  Fever of 100F or higher   FOLLOW UP: If any biopsies were taken you will be contacted by phone or by letter within the next 1-3 weeks.  Call your gastroenterologist if you have not heard about the biopsies in 3 weeks.  Our staff will call the home number listed on your records the next business day following your procedure to check on you and address any questions or concerns that you may have at that time regarding the information given to you following your procedure. This is a courtesy call and so if there is no answer at the home number and we have not heard from you through the emergency physician on call, we will assume that you have returned to your regular daily activities without incident.  SIGNATURES/CONFIDENTIALITY: You and/or your care partner have signed paperwork which will be entered into your electronic medical record.  These signatures attest to the fact that that the information above on your After Visit Summary has been reviewed and is understood.  Full responsibility of the confidentiality of   this discharge information lies with you and/or your care-partner.   Resume medications. Information given on polyps,diverticulosis and high fiber diet with discharge instructions. 

## 2013-08-29 ENCOUNTER — Telehealth: Payer: Self-pay

## 2013-08-29 ENCOUNTER — Ambulatory Visit: Payer: Commercial Managed Care - HMO | Admitting: Family Medicine

## 2013-08-29 NOTE — Telephone Encounter (Signed)
  Follow up Call-  Call back number 08/28/2013  Post procedure Call Back phone  # [phone]  Permission to leave phone message Yes     Patient questions:  Do you have a fever, pain , or abdominal swelling? No. Pain Score  0 *  Have you tolerated food without any problems? Yes.    Have you been able to return to your normal activities? Yes.    Do you have any questions about your discharge instructions: Diet   No. Medications  No. Follow up visit  No.  Do you have questions or concerns about your Care? No.  Actions: * If pain score is 4 or above: No action needed, pain <4.

## 2013-09-03 ENCOUNTER — Encounter: Payer: Self-pay | Admitting: Internal Medicine

## 2013-09-05 ENCOUNTER — Encounter: Payer: Self-pay | Admitting: *Deleted

## 2013-09-18 ENCOUNTER — Other Ambulatory Visit: Payer: Self-pay | Admitting: Family Medicine

## 2013-09-18 DIAGNOSIS — E785 Hyperlipidemia, unspecified: Secondary | ICD-10-CM

## 2013-09-25 ENCOUNTER — Other Ambulatory Visit (INDEPENDENT_AMBULATORY_CARE_PROVIDER_SITE_OTHER): Payer: Commercial Managed Care - HMO

## 2013-09-25 DIAGNOSIS — C50219 Malignant neoplasm of upper-inner quadrant of unspecified female breast: Secondary | ICD-10-CM

## 2013-09-25 DIAGNOSIS — E785 Hyperlipidemia, unspecified: Secondary | ICD-10-CM

## 2013-09-25 LAB — COMPREHENSIVE METABOLIC PANEL
ALT: 28 U/L (ref 0–35)
AST: 27 U/L (ref 0–37)
Albumin: 4.2 g/dL (ref 3.5–5.2)
Alkaline Phosphatase: 88 U/L (ref 39–117)
BUN: 21 mg/dL (ref 6–23)
CHLORIDE: 104 meq/L (ref 96–112)
CO2: 29 meq/L (ref 19–32)
CREATININE: 0.8 mg/dL (ref 0.4–1.2)
Calcium: 10.3 mg/dL (ref 8.4–10.5)
GFR: 71.93 mL/min (ref >60.00)
Glucose, Bld: 98 mg/dL (ref 70–99)
Potassium: 4.8 mEq/L (ref 3.5–5.1)
SODIUM: 137 meq/L (ref 135–145)
TOTAL PROTEIN: 7.9 g/dL (ref 6.0–8.3)
Total Bilirubin: 0.5 mg/dL (ref 0.2–1.2)

## 2013-09-25 LAB — LIPID PANEL
CHOL/HDL RATIO: 4
Cholesterol: 257 mg/dL — ABNORMAL HIGH (ref 0–200)
HDL: 58.7 mg/dL (ref >39.00)
LDL Cholesterol: 173 mg/dL — ABNORMAL HIGH (ref 0–99)
NONHDL: 198.3
Triglycerides: 128 mg/dL (ref 0.0–149.0)
VLDL: 25.6 mg/dL (ref 0.0–40.0)

## 2013-10-02 ENCOUNTER — Ambulatory Visit (INDEPENDENT_AMBULATORY_CARE_PROVIDER_SITE_OTHER): Payer: Commercial Managed Care - HMO | Admitting: Family Medicine

## 2013-10-02 VITALS — BP 126/72 | HR 83 | Temp 98.1°F | Ht 60.25 in | Wt 177.2 lb

## 2013-10-02 DIAGNOSIS — E785 Hyperlipidemia, unspecified: Secondary | ICD-10-CM

## 2013-10-02 DIAGNOSIS — Z23 Encounter for immunization: Secondary | ICD-10-CM

## 2013-10-02 DIAGNOSIS — G4700 Insomnia, unspecified: Secondary | ICD-10-CM

## 2013-10-02 DIAGNOSIS — C50219 Malignant neoplasm of upper-inner quadrant of unspecified female breast: Secondary | ICD-10-CM

## 2013-10-02 MED ORDER — EZETIMIBE 10 MG PO TABS: 10.0000 mg | ORAL_TABLET | Freq: Every day | ORAL | Status: DC

## 2013-10-02 NOTE — Assessment & Plan Note (Signed)
Followed by oncology. Doing well.

## 2013-10-02 NOTE — Progress Notes (Signed)
Subjective:    Patient ID: Kayla Mcgrath, female    DOB: October 22, 1945, 68 y.o.   MRN: [id number]  HPI  68 yo pleasant female here for 3 month follow up.     Breast CA- doing well. S/p mastectomy after recurrence last year.  Last saw Dr. Griffith Citron Erasmo Downer, NP) on 07/31/13.  Marland Kitchen  Note reviewed.  On anatrozole since 07/2012- plans to continue until 07/2017.   Mammogram neg 05/08/13. Pneumovax 05/07/12 Zostavax 05/07/12  Insomnia- when I saw her in 05/2013, she was complaining of more difficulty sleeping.  Benadryl was no longer effective.  Started Trazadone advised prn melatonin.  She tried Trazadone for awhile but stopped taking it- thought it was not that helpful.  Sleeping a little better now- just having more vivid dreams.  HLD-  LDL elevated.  Previously on simvastatin- caused myalgais.  She wanted to work on diet and recheck in three months- remains elevated.   Lab Results  Component Value Date   CHOL 257* 09/25/2013   HDL 58.70 09/25/2013   LDLCALC 173* 09/25/2013   LDLDIRECT 161.9 04/30/2012   TRIG 128.0 09/25/2013   CHOLHDL 4 09/25/2013   Lab Results  Component Value Date   NA 137 09/25/2013   K 4.8 09/25/2013   CL 104 09/25/2013   CO2 29 09/25/2013   Lab Results  Component Value Date   CREATININE 0.8 09/25/2013   Wt Readings from Last 3 Encounters:  10/02/13 177 lb 4 oz (80.4 kg)  08/28/13 177 lb (80.287 kg)  07/31/13 177 lb 4.8 oz (80.423 kg)    Patient Active Problem List   Diagnosis Date Noted  . Insomnia 05/30/2013  . HLD (hyperlipidemia) 05/07/2012  . DNR (do not resuscitate) 05/07/2012  . Cancer of upper-inner quadrant of female breast 04/27/2012   Past Medical History  Diagnosis Date  . DCIS (ductal carcinoma in situ) of breast 2010  . Hyperlipidemia   . Breast cancer   . Skin cancer   . Joint pain   . Wears glasses   . Full dentures   . Colon polyps     ?  Marland Kitchen Diverticulosis    Past Surgical History  Procedure Laterality Date  . Breast lumpectomy Right  december 2010    cancer-tamoxifen  . Dilation and curettage of uterus    . Colonoscopy    . Mastectomy w/ sentinel node biopsy Right 06/20/2012    Procedure: right skin sparing MASTECTOMY WITH SENTINEL LYMPH NODE BIOPSY;  Surgeon: Stark Klein, MD;  Location: [facility];  Service: General;  Laterality: Right;  . Breast reconstruction with placement of tissue expander and flex hd (acellular hydrated dermis) Right 06/20/2012    Procedure: BREAST RECONSTRUCTION WITH PLACEMENT OF TISSUE EXPANDER AND FLEX HD (ACELLULAR HYDRATED DERMIS);  Surgeon: Theodoro Kos, DO;  Location: [facility];  Service: Plastics;  Laterality: Right;  . Removal of tissue expander and placement of implant Right 10/11/2012    Procedure: REMOVAL OF TISSUE EXPANDER AND PLACEMENT OF SILICONE IMPLANT RIGHT;  Surgeon: Theodoro Kos, DO;  Location: [facility];  Service: Plastics;  Laterality: Right;  . Breast reduction with mastopexy Left 10/11/2012    Procedure: LEFT BREAST REDUCTION WITH MASTOPEXY;  Surgeon: Theodoro Kos, DO;  Location: [place] [facility];  Service: Plastics;  Laterality: Left;  . Liposuction with lipofilling Left 10/11/2012    Procedure: LIPOSUCTION WITH LIPOFILLING;  Surgeon: Theodoro Kos, DO;  Location: [facility];  Service: Plastics;  Laterality: Left;  History  Substance Use Topics  . Smoking status: Former Smoker    Types: Cigarettes    Quit date: 06/14/1981  . Smokeless tobacco: Never Used  . Alcohol Use: No   Family History  Problem Relation Age of Onset  . Alcohol abuse Mother   . Alcohol abuse Father     suicide  . Mental illness Father   . Colon polyps Mother   . Heart disease Paternal Grandfather   . Mental illness Mother    Allergies  Allergen Reactions  . Latex Rash   Current Outpatient Prescriptions on File Prior to Visit  Medication Sig Dispense Refill  . anastrozole (ARIMIDEX) 1 MG tablet Take 1 tablet (1 mg total)  by mouth daily.  90 tablet  12  . Cholecalciferol (VITAMIN D) 2000 UNITS CAPS Take one by mouth daily      . triamcinolone cream (KENALOG) 0.1 % Apply 1 application topically as needed.       No current facility-administered medications on file prior to visit.   The PMH, PSH, Social History, Family History, Medications, and allergies have been reviewed in [facility], and have been updated if relevant.   Review of Systems See HPI     Denies any anxiety or depression No LE edema No CP or SOB   No rashes Objective:   Physical Exam BP 126/72  Pulse 83  Temp(Src) 98.1 F (36.7 C) (Oral)  Ht 5' 0.25" (1.53 m)  Wt 177 lb 4 oz (80.4 kg)  BMI 34.35 kg/m2  SpO2 98%  General:  Well-developed,well-nourished,in no acute distress; alert,appropriate and cooperative throughout examination Head:  normocephalic and atraumatic.   Psych:  Cognition and judgment appear intact. Alert and cooperative with normal attention span and concentration. No apparent delusions, illusions, hallucinations        Assessment & Plan:

## 2013-10-02 NOTE — Progress Notes (Signed)
Pre visit review using our clinic review tool, if applicable. No additional management support is needed unless otherwise documented below in the visit note. 

## 2013-10-02 NOTE — Patient Instructions (Signed)
Great to see you. We are starting Zetia 10 mg daily.  Call me if any negative side effects.  Schedule follow up labs in 8 weeks.

## 2013-10-02 NOTE — Assessment & Plan Note (Signed)
Deteriorated. >25 minutes spent in face to face time with patient, >50% spent in counselling or coordination of care Refuses statins but she is willing to try Zetia.  Explained there is not the mortality data that statins have but should reduce her LDL. Recheck lipids and CMET in 8 weeks.

## 2013-10-02 NOTE — Assessment & Plan Note (Signed)
Improved off rx.

## 2013-10-02 NOTE — Addendum Note (Signed)
Addended by: Modena Nunnery on: 10/02/2013 08:46 AM   Modules accepted: Orders

## 2013-10-09 ENCOUNTER — Other Ambulatory Visit: Payer: Self-pay

## 2013-10-09 ENCOUNTER — Other Ambulatory Visit: Payer: Self-pay | Admitting: Emergency Medicine

## 2013-10-09 DIAGNOSIS — C50219 Malignant neoplasm of upper-inner quadrant of unspecified female breast: Secondary | ICD-10-CM

## 2013-10-09 MED ORDER — EZETIMIBE 10 MG PO TABS
10.0000 mg | ORAL_TABLET | Freq: Every day | ORAL | Status: DC
Start: 2013-10-09 — End: 2013-12-09

## 2013-10-09 MED ORDER — ANASTROZOLE 1 MG PO TABS: 1.0000 mg | ORAL_TABLET | Freq: Every day | ORAL | Status: DC

## 2013-10-09 NOTE — Telephone Encounter (Signed)
Pt request refill of zetia to [facility]; advised pt done and called [facility] [address] pharmacy and cancelled refills. Pt advised refill done. Pt has already set up acct with [employer].

## 2013-10-11 ENCOUNTER — Telehealth: Payer: Self-pay | Admitting: Oncology

## 2013-10-11 NOTE — Telephone Encounter (Signed)
s.w. pt and r/s appt per pt request due to going out of town....pt ok and aware of new d.t

## 2013-11-22 ENCOUNTER — Other Ambulatory Visit: Payer: Self-pay | Admitting: Family Medicine

## 2013-11-22 DIAGNOSIS — E785 Hyperlipidemia, unspecified: Secondary | ICD-10-CM

## 2013-11-27 ENCOUNTER — Other Ambulatory Visit (INDEPENDENT_AMBULATORY_CARE_PROVIDER_SITE_OTHER): Payer: Commercial Managed Care - HMO

## 2013-11-27 ENCOUNTER — Encounter: Payer: Self-pay | Admitting: *Deleted

## 2013-11-27 DIAGNOSIS — E785 Hyperlipidemia, unspecified: Secondary | ICD-10-CM

## 2013-11-27 LAB — COMPREHENSIVE METABOLIC PANEL
ALT: 26 U/L (ref 0–35)
AST: 29 U/L (ref 0–37)
Albumin: 4.1 g/dL (ref 3.5–5.2)
Alkaline Phosphatase: 85 U/L (ref 39–117)
BILIRUBIN TOTAL: 0.6 mg/dL (ref 0.2–1.2)
BUN: 17 mg/dL (ref 6–23)
CO2: 28 mEq/L (ref 19–32)
CREATININE: 0.7 mg/dL (ref 0.4–1.2)
Calcium: 10.2 mg/dL (ref 8.4–10.5)
Chloride: 104 mEq/L (ref 96–112)
GFR: 87.28 mL/min (ref >60.00)
Glucose, Bld: 93 mg/dL (ref 70–99)
Potassium: 4.4 mEq/L (ref 3.5–5.1)
SODIUM: 139 meq/L (ref 135–145)
Total Protein: 7.4 g/dL (ref 6.0–8.3)

## 2013-11-27 LAB — LIPID PANEL
Cholesterol: 224 mg/dL — ABNORMAL HIGH (ref 0–200)
HDL: 60.5 mg/dL (ref >39.00)
LDL Cholesterol: 143 mg/dL — ABNORMAL HIGH (ref 0–99)
NONHDL: 163.5
Total CHOL/HDL Ratio: 4
Triglycerides: 105 mg/dL (ref 0.0–149.0)
VLDL: 21 mg/dL (ref 0.0–40.0)

## 2013-12-09 ENCOUNTER — Other Ambulatory Visit: Payer: Self-pay | Admitting: *Deleted

## 2013-12-09 MED ORDER — EZETIMIBE 10 MG PO TABS: 10.0000 mg | ORAL_TABLET | Freq: Every day | ORAL | Status: DC

## 2014-01-27 ENCOUNTER — Ambulatory Visit (HOSPITAL_BASED_OUTPATIENT_CLINIC_OR_DEPARTMENT_OTHER): Payer: Commercial Managed Care - HMO | Admitting: Oncology

## 2014-01-27 ENCOUNTER — Telehealth: Payer: Self-pay | Admitting: Oncology

## 2014-01-27 ENCOUNTER — Other Ambulatory Visit (HOSPITAL_BASED_OUTPATIENT_CLINIC_OR_DEPARTMENT_OTHER): Payer: Commercial Managed Care - HMO

## 2014-01-27 VITALS — BP 121/65 | HR 83 | Temp 98.7°F | Resp 18 | Ht 60.25 in | Wt 174.2 lb

## 2014-01-27 DIAGNOSIS — M8580 Other specified disorders of bone density and structure, unspecified site: Secondary | ICD-10-CM

## 2014-01-27 DIAGNOSIS — C50219 Malignant neoplasm of upper-inner quadrant of unspecified female breast: Secondary | ICD-10-CM

## 2014-01-27 DIAGNOSIS — N941 Dyspareunia: Secondary | ICD-10-CM | POA: Diagnosis not present

## 2014-01-27 DIAGNOSIS — Z853 Personal history of malignant neoplasm of breast: Secondary | ICD-10-CM | POA: Diagnosis not present

## 2014-01-27 DIAGNOSIS — C50211 Malignant neoplasm of upper-inner quadrant of right female breast: Secondary | ICD-10-CM

## 2014-01-27 DIAGNOSIS — E785 Hyperlipidemia, unspecified: Secondary | ICD-10-CM

## 2014-01-27 DIAGNOSIS — N899 Noninflammatory disorder of vagina, unspecified: Secondary | ICD-10-CM

## 2014-01-27 LAB — COMPREHENSIVE METABOLIC PANEL (CC13)
ALBUMIN: 4 g/dL (ref 3.5–5.0)
ALK PHOS: 92 U/L (ref 40–150)
ALT: 28 U/L (ref 0–55)
AST: 28 U/L (ref 5–34)
Anion Gap: 10 mEq/L (ref 3–11)
BUN: 14.6 mg/dL (ref 7.0–26.0)
CO2: 27 mEq/L (ref 22–29)
CREATININE: 0.8 mg/dL (ref 0.6–1.1)
Calcium: 10.1 mg/dL (ref 8.4–10.4)
Chloride: 103 mEq/L (ref 98–109)
EGFR: 79 mL/min/{1.73_m2} — AB (ref >90)
Glucose: 92 mg/dl (ref 70–140)
Potassium: 4 mEq/L (ref 3.5–5.1)
Sodium: 140 mEq/L (ref 136–145)
Total Bilirubin: 0.41 mg/dL (ref 0.20–1.20)
Total Protein: 7.2 g/dL (ref 6.4–8.3)

## 2014-01-27 LAB — CBC WITH DIFFERENTIAL/PLATELET
BASO%: 0.8 % (ref 0.0–2.0)
BASOS ABS: 0 10*3/uL (ref 0.0–0.1)
EOS%: 2.3 % (ref 0.0–7.0)
Eosinophils Absolute: 0.1 10*3/uL (ref 0.0–0.5)
HEMATOCRIT: 43.1 % (ref 34.8–46.6)
HEMOGLOBIN: 14.5 g/dL (ref 11.6–15.9)
LYMPH%: 25.6 % (ref 14.0–49.7)
MCH: 31.3 pg (ref 25.1–34.0)
MCHC: 33.6 g/dL (ref 31.5–36.0)
MCV: 92.9 fL (ref 79.5–101.0)
MONO#: 0.5 10*3/uL (ref 0.1–0.9)
MONO%: 8.7 % (ref 0.0–14.0)
NEUT%: 62.6 % (ref 38.4–76.8)
NEUTROS ABS: 3.3 10*3/uL (ref 1.5–6.5)
PLATELETS: 234 10*3/uL (ref 145–400)
RBC: 4.64 10*6/uL (ref 3.70–5.45)
RDW: 12.9 % (ref 11.2–14.5)
WBC: 5.2 10*3/uL (ref 3.9–10.3)
lymph#: 1.3 10*3/uL (ref 0.9–3.3)

## 2014-01-27 MED ORDER — ANASTROZOLE 1 MG PO TABS: 1.0000 mg | ORAL_TABLET | Freq: Every day | ORAL | Status: DC

## 2014-01-27 NOTE — Progress Notes (Signed)
ID: Kayla Mcgrath   DOB: 1945-12-01  MR#: [id number]  ZWC#:[id number]  PCP: Arnette Norris, MD GYN:  SUStark Klein OTHER MD: [name]; [name] [phone]), Theodoro Kos  CHIEF COMPLAINT:  Right Breast Cancer  CURRENT TREATMENT: Anastrozole   HISTORY OF PRESENT ILLNESS: From the original intake note:  Kayla Mcgrath underwent right lumpectomy in December of 2010 for a 1.2 cm ductal carcinoma in situ, intermediate grade, with close but negative margins (1 mm). I do not have the prognostic panel, but per patient and history the tumor was estrogen receptor positive and Kayla Mcgrath took tamoxifen for approximately one year. She declined radiation.  She was then followed closely, with mammography every 6 months. On 03/08/2012 a new area of amorphous calcifications measuring 2 mm were noted at the 9:00 position of the right breast. There was also an isodense oval mass measuring 7 mm at the 2:00 position in the same breast. Ultrasound of the right breast confirmed a 6 mm oval hypoechoic mass with microlobulated margins.  The patient was referred to the breast Center for biopsy, and on for 15 2014 underwent biopsy of the small area of calcifications in the right breast as well as the breast mass in the same breast. These showed ([id number]) invasive ductal carcinoma, grade 2 or 3, estrogen receptor 100% positive, progesterone receptor 10% positive, with an MIB-1 of 35%, and no HER-2 amplification. The small area of calcification showed only atypical ductal hyperplasia.  The patient's subsequent history is as detailed below.  INTERVAL HISTORY: Kayla Mcgrath returns today for followup of her breast cancer. The interval history is unremarkable. Family is doing great and she is planning a or B" whose with her 56 and 57-year-old grandchildren. She is tolerating anastrozole with significant vaginal dryness and dyspareunia problems. She came to our "intimacy and pelvic health program", and that helped "a little".  This remains a major issue for her.   REVIEW OF SYSTEMS: Otherwise she never developed the arthralgias/myalgias that many patients can experience on anastrozole. She also does not have significant problems with hot flashes. She is obtaining the drug and essentially no cost. She exercises chiefly by walking her dog about half a mile a day. A detailed review of systems today was otherwise stable  PAST MEDICAL HISTORY: Past Medical History  Diagnosis Date  . DCIS (ductal carcinoma in situ) of breast 2010  . Hyperlipidemia   . Breast cancer   . Skin cancer   . Joint pain   . Wears glasses   . Full dentures   . Colon polyps     ?  Marland Kitchen Diverticulosis     PAST SURGICAL HISTORY: Past Surgical History  Procedure Laterality Date  . Breast lumpectomy Right december 2010    cancer-tamoxifen  . Dilation and curettage of uterus    . Colonoscopy    . Mastectomy w/ sentinel node biopsy Right 06/20/2012    Procedure: right skin sparing MASTECTOMY WITH SENTINEL LYMPH NODE BIOPSY;  Surgeon: Stark Klein, MD;  Location: [facility];  Service: General;  Laterality: Right;  . Breast reconstruction with placement of tissue expander and flex hd (acellular hydrated dermis) Right 06/20/2012    Procedure: BREAST RECONSTRUCTION WITH PLACEMENT OF TISSUE EXPANDER AND FLEX HD (ACELLULAR HYDRATED DERMIS);  Surgeon: Theodoro Kos, DO;  Location: [facility];  Service: Plastics;  Laterality: Right;  . Removal of tissue expander and placement of implant Right 10/11/2012    Procedure: REMOVAL OF TISSUE EXPANDER AND PLACEMENT OF  SILICONE IMPLANT RIGHT;  Surgeon: Theodoro Kos, DO;  Location: [facility];  Service: Plastics;  Laterality: Right;  . Breast reduction with mastopexy Left 10/11/2012    Procedure: LEFT BREAST REDUCTION WITH MASTOPEXY;  Surgeon: Theodoro Kos, DO;  Location: [place];  Service: Plastics;  Laterality: Left;  . Liposuction with lipofilling  Left 10/11/2012    Procedure: LIPOSUCTION WITH LIPOFILLING;  Surgeon: Theodoro Kos, DO;  Location: [facility];  Service: Plastics;  Laterality: Left;    FAMILY HISTORY Family History  Problem Relation Age of Onset  . Alcohol abuse Mother   . Alcohol abuse Father     suicide  . Mental illness Father   . Colon polyps Mother   . Heart disease Paternal Grandfather   . Mental illness Mother    the patient's father committed suicide at the age of 64. The patient's mother died at the age of 89 in the setting of alcohol abuse. The patient has one brother, 2 sisters. There is no history of breast or ovarian cancer in the family to the patient's knowledge.  GYNECOLOGIC HISTORY: Menarche age 30, first live birth age 46, the patient is Kayla Mcgrath P2. She went through menopause approximately 1998. She did not take hormone replacement.  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 Her husband Kayla Mcgrath, currently retired, used to Sports administrator. Daughter Kayla Mcgrath lives in [place] and works as an Therapist, sports for [employer] and also at [employer] in [place]. Daughter Kayla Mcgrath is a homemaker in [place]. The patient has 6 grandchildren and one on the way. She is a Tourist information centre manager.    ADVANCED DIRECTIVES: Not in place  HEALTH MAINTENANCE: (Updated 11/01/2012) History  Substance Use Topics  . Smoking status: Former Smoker    Types: Cigarettes    Quit date: 06/14/1981  . Smokeless tobacco: Never Used  . Alcohol Use: No     Colonoscopy: 2009  PAP: 2012  Bone density: Sept 2014, osteopenia  Lipid panel: April 2014, Dr. Deborra Medina   Allergies  Allergen Reactions  . Latex Rash    Current Outpatient Prescriptions  Medication Sig Dispense Refill  . anastrozole (ARIMIDEX) 1 MG tablet Take 1 tablet (1 mg total) by mouth daily. 90 tablet 3  . Cholecalciferol (VITAMIN D) 2000 UNITS CAPS Take one by mouth daily    . ezetimibe (ZETIA) 10 MG tablet Take 1 tablet  (10 mg total) by mouth daily. 90 tablet 0  . triamcinolone cream (KENALOG) 0.1 % Apply 1 application topically as needed.     No current facility-administered medications for this visit.    OBJECTIVE: Middle-aged white woman who appears stated age 69 Vitals:   01/27/14 0921  BP: 121/65  Pulse: 83  Temp: 98.7 F (37.1 C)  Resp: 18     Body mass index is 33.75 kg/(m^2).    ECOG FS: 1 Filed Weights   01/27/14 0921  Weight: 174 lb 3.2 oz (79.017 kg)   Sclerae unicteric, EOMs intact Oropharynx clear, no thrush or other lesions No cervical or supraclavicular adenopathy Lungs no rales or rhonchi Heart regular rate and rhythm Abd soft, nontender, positive bowel sounds MSK no focal spinal tenderness, no upper extremity lymphedema Neuro: nonfocal, well oriented, positive affect Breasts: The right breast is status post mastectomy and reconstruction. There is no evidence of chest wall recurrence. The right axilla is benign. The left breast is unremarkable    LAB RESULTS: Lab Results  Component Value Date  WBC 5.2 01/27/2014   NEUTROABS 3.3 01/27/2014   HGB 14.5 01/27/2014   HCT 43.1 01/27/2014   MCV 92.9 01/27/2014   PLT 234 01/27/2014      Chemistry      Component Value Date/Time   NA 139 11/27/2013 0900   NA 141 07/29/2013 0920   K 4.4 11/27/2013 0900   K 4.8 07/29/2013 0920   CL 104 11/27/2013 0900   CL 105 05/02/2012 0822   CO2 28 11/27/2013 0900   CO2 28 07/29/2013 0920   BUN 17 11/27/2013 0900   BUN 15.6 07/29/2013 0920   CREATININE 0.7 11/27/2013 0900   CREATININE 0.8 07/29/2013 0920      Component Value Date/Time   CALCIUM 10.2 11/27/2013 0900   CALCIUM 10.2 07/29/2013 0920   ALKPHOS 85 11/27/2013 0900   ALKPHOS 86 07/29/2013 0920   AST 29 11/27/2013 0900   AST 28 07/29/2013 0920   ALT 26 11/27/2013 0900   ALT 24 07/29/2013 0920   BILITOT 0.6 11/27/2013 0900   BILITOT 0.48 07/29/2013 0920       STUDIES: DIGITAL SCREENING UNILATERAL LEFT  MAMMOGRAM WITH TOMO AND CAD  COMPARISON: Previous exam(s).  ACR Breast Density Category c: The breast tissue is heterogeneously dense, which may obscure small masses.  FINDINGS: There are no findings suspicious for malignancy. There is density in the inferomedial left breast, which is new, but presumed to be from the interval breast reduction surgery. Images were processed with CAD.  IMPRESSION: No mammographic evidence of malignancy. A result letter of this screening mammogram will be mailed directly to the patient.  RECOMMENDATION: Screening mammogram in one year. (Code:SM-B-01Y)  BI-RADS CATEGORY 1: Negative.   Electronically Signed  By: Lajean Manes M.D.  On: 05/08/2013 15:52  ASSESSMENT: 69 y.o. [place] woman  (1) status post right lumpectomy December of 2010 for ductal carcinoma in situ, intermediate grade, estrogen receptor positive, with close but negative margins, the patient refusing adjuvant radiation therapy, but taking adjuvant tamoxifen approximately one year  (2) status post right breast upper inner quadrant biopsy 04/26/2012 for a clinical T1b N0, stage IA invasive ductal carcinoma, intermediate grade, estrogen receptor 100% and progesterone receptor 10% positive, with no HER-2 amplification, and an MIB-1 of 35%  (3) status post right mastectomy and sentinel lymph node sampling 06/20/2012 for a pT1b pN0, stage IA invasive ductal carcinoma, grade 2, again HER-2 negative.  (4) Oncotype DX score of 11 predicting a distant recurrence within 10 years of 8% if the patient's only adjuvant systemic treatment is tamoxifen for 5 years.  (5) began on anastrozole in July 2014, the plan being to continue for total of 5 years (until July 2019)  (6)  status post reconstruction on the right, and reduction mammoplasty on the left on 10-14 under the care of Dr. Migdalia Dk  (7) osteopenia with DEXA scan 10/02/2012 showing a T score of -2.2  PLAN:  Cinderella is  willing to continue on the anastrozole for now, but she is having significant vaginal dryness problems, over and above what one would expect simply from menopause. This solution to this, as well as to the osteopenia, is to switch to tamoxifen. The data however suggests that she should really be on the anastrozole for 2 years before switching, so she will see me again in June of this year and at that point we will discuss tamoxifen again.  While on tamoxifen she will be able to use vaginal estrogens, either Estring or Vagifem. That should help significantly  with the vaginal dryness and dyspareunia issues.  She will be due for repeat bone density September of this year. We will consider Reclast depending on those results. In the meantime I have given her a Counsellor and suggests that she enroll in that program.  Rema has a good understanding of the overall plan. She agrees with it. She knows the goal of treatment in her case is cure. She will call with any problems that may develop before her next visit here.   Chauncey Cruel, MD      01/27/2014

## 2014-01-27 NOTE — Telephone Encounter (Signed)
per pof to sch pt appt-pt re to mail copy of sch-mailed

## 2014-01-29 ENCOUNTER — Ambulatory Visit: Payer: Commercial Managed Care - HMO | Admitting: Oncology

## 2014-01-29 ENCOUNTER — Other Ambulatory Visit: Payer: Commercial Managed Care - HMO

## 2014-02-06 ENCOUNTER — Other Ambulatory Visit: Payer: Self-pay | Admitting: Family Medicine

## 2014-03-03 DIAGNOSIS — H2510 Age-related nuclear cataract, unspecified eye: Secondary | ICD-10-CM | POA: Diagnosis not present

## 2014-03-03 DIAGNOSIS — H5210 Myopia, unspecified eye: Secondary | ICD-10-CM | POA: Diagnosis not present

## 2014-03-03 DIAGNOSIS — H2513 Age-related nuclear cataract, bilateral: Secondary | ICD-10-CM | POA: Diagnosis not present

## 2014-03-31 ENCOUNTER — Other Ambulatory Visit: Payer: Self-pay

## 2014-03-31 ENCOUNTER — Other Ambulatory Visit: Payer: Self-pay | Admitting: Oncology

## 2014-03-31 DIAGNOSIS — Z9011 Acquired absence of right breast and nipple: Secondary | ICD-10-CM

## 2014-03-31 DIAGNOSIS — Z1231 Encounter for screening mammogram for malignant neoplasm of breast: Secondary | ICD-10-CM

## 2014-03-31 DIAGNOSIS — Z853 Personal history of malignant neoplasm of breast: Secondary | ICD-10-CM

## 2014-04-15 ENCOUNTER — Other Ambulatory Visit: Payer: Self-pay | Admitting: Family Medicine

## 2014-04-15 DIAGNOSIS — L814 Other melanin hyperpigmentation: Secondary | ICD-10-CM | POA: Diagnosis not present

## 2014-04-15 DIAGNOSIS — D225 Melanocytic nevi of trunk: Secondary | ICD-10-CM | POA: Diagnosis not present

## 2014-04-15 DIAGNOSIS — L821 Other seborrheic keratosis: Secondary | ICD-10-CM | POA: Diagnosis not present

## 2014-04-15 DIAGNOSIS — D1801 Hemangioma of skin and subcutaneous tissue: Secondary | ICD-10-CM | POA: Diagnosis not present

## 2014-04-15 DIAGNOSIS — L820 Inflamed seborrheic keratosis: Secondary | ICD-10-CM | POA: Diagnosis not present

## 2014-05-08 ENCOUNTER — Ambulatory Visit
Admission: RE | Admit: 2014-05-08 | Discharge: 2014-05-08 | Disposition: A | Discharge status: Home / Self Care | Payer: Commercial Managed Care - HMO | Source: Ambulatory Visit

## 2014-05-08 DIAGNOSIS — Z853 Personal history of malignant neoplasm of breast: Secondary | ICD-10-CM

## 2014-05-08 DIAGNOSIS — Z9011 Acquired absence of right breast and nipple: Secondary | ICD-10-CM

## 2014-05-08 DIAGNOSIS — Z1231 Encounter for screening mammogram for malignant neoplasm of breast: Secondary | ICD-10-CM | POA: Diagnosis not present

## 2014-06-03 ENCOUNTER — Other Ambulatory Visit: Payer: Self-pay | Admitting: Family Medicine

## 2014-06-03 DIAGNOSIS — E785 Hyperlipidemia, unspecified: Secondary | ICD-10-CM

## 2014-06-06 ENCOUNTER — Other Ambulatory Visit (INDEPENDENT_AMBULATORY_CARE_PROVIDER_SITE_OTHER): Payer: Commercial Managed Care - HMO

## 2014-06-06 DIAGNOSIS — E785 Hyperlipidemia, unspecified: Secondary | ICD-10-CM | POA: Diagnosis not present

## 2014-06-06 LAB — CBC WITH DIFFERENTIAL/PLATELET
BASOS PCT: 0.8 % (ref 0.0–3.0)
Basophils Absolute: 0 10*3/uL (ref 0.0–0.1)
EOS PCT: 3.7 % (ref 0.0–5.0)
Eosinophils Absolute: 0.2 10*3/uL (ref 0.0–0.7)
HCT: 42.9 % (ref 36.0–46.0)
Hemoglobin: 14.7 g/dL (ref 12.0–15.0)
LYMPHS ABS: 1.5 10*3/uL (ref 0.7–4.0)
Lymphocytes Relative: 34.6 % (ref 12.0–46.0)
MCHC: 34.2 g/dL (ref 30.0–36.0)
MCV: 91.6 fl (ref 78.0–100.0)
Monocytes Absolute: 0.4 10*3/uL (ref 0.1–1.0)
Monocytes Relative: 8.8 % (ref 3.0–12.0)
NEUTROS ABS: 2.3 10*3/uL (ref 1.4–7.7)
Neutrophils Relative %: 52.1 % (ref 43.0–77.0)
Platelets: 214 10*3/uL (ref 150.0–400.0)
RBC: 4.69 Mil/uL (ref 3.87–5.11)
RDW: 13.1 % (ref 11.5–15.5)
WBC: 4.3 10*3/uL (ref 4.0–10.5)

## 2014-06-06 LAB — COMPREHENSIVE METABOLIC PANEL
ALBUMIN: 4.1 g/dL (ref 3.5–5.2)
ALT: 29 U/L (ref 0–35)
AST: 28 U/L (ref 0–37)
Alkaline Phosphatase: 88 U/L (ref 39–117)
BILIRUBIN TOTAL: 0.5 mg/dL (ref 0.2–1.2)
BUN: 16 mg/dL (ref 6–23)
CALCIUM: 10.3 mg/dL (ref 8.4–10.5)
CO2: 30 meq/L (ref 19–32)
Chloride: 103 mEq/L (ref 96–112)
Creatinine, Ser: 0.72 mg/dL (ref 0.40–1.20)
GFR: 85.75 mL/min (ref >60.00)
Glucose, Bld: 95 mg/dL (ref 70–99)
POTASSIUM: 4.3 meq/L (ref 3.5–5.1)
SODIUM: 136 meq/L (ref 135–145)
Total Protein: 7 g/dL (ref 6.0–8.3)

## 2014-06-06 LAB — LIPID PANEL
CHOL/HDL RATIO: 3
Cholesterol: 212 mg/dL — ABNORMAL HIGH (ref 0–200)
HDL: 61.2 mg/dL (ref >39.00)
LDL CALC: 135 mg/dL — AB (ref 0–99)
NONHDL: 150.8
Triglycerides: 81 mg/dL (ref 0.0–149.0)
VLDL: 16.2 mg/dL (ref 0.0–40.0)

## 2014-06-06 LAB — TSH: TSH: 0.84 u[IU]/mL (ref 0.35–4.50)

## 2014-06-12 ENCOUNTER — Encounter: Payer: Self-pay | Admitting: Family Medicine

## 2014-06-12 ENCOUNTER — Ambulatory Visit (INDEPENDENT_AMBULATORY_CARE_PROVIDER_SITE_OTHER): Payer: Commercial Managed Care - HMO | Admitting: Family Medicine

## 2014-06-12 VITALS — BP 122/76 | HR 71 | Temp 98.0°F | Ht 60.25 in | Wt 174.2 lb

## 2014-06-12 DIAGNOSIS — C50211 Malignant neoplasm of upper-inner quadrant of right female breast: Secondary | ICD-10-CM

## 2014-06-12 DIAGNOSIS — Z0000 Encounter for general adult medical examination without abnormal findings: Secondary | ICD-10-CM | POA: Diagnosis not present

## 2014-06-12 DIAGNOSIS — Z66 Do not resuscitate: Secondary | ICD-10-CM | POA: Diagnosis not present

## 2014-06-12 DIAGNOSIS — E785 Hyperlipidemia, unspecified: Secondary | ICD-10-CM | POA: Diagnosis not present

## 2014-06-12 DIAGNOSIS — M8580 Other specified disorders of bone density and structure, unspecified site: Secondary | ICD-10-CM

## 2014-06-12 DIAGNOSIS — G4700 Insomnia, unspecified: Secondary | ICD-10-CM

## 2014-06-12 NOTE — Assessment & Plan Note (Signed)
Followed by oncology.  Will get changing to tamoxifen this month.

## 2014-06-12 NOTE — Patient Instructions (Signed)
Great to see you. Have a wonderful summer. I am very pleased with your lab results.

## 2014-06-12 NOTE — Assessment & Plan Note (Signed)
Well controlled on zetia. No changes made today.

## 2014-06-12 NOTE — Assessment & Plan Note (Signed)
The patients weight, height, BMI and visual acuity have been recorded in the chart I have made referrals, counseling and provided education to the patient based review of the above and I have provided the pt with a written personalized care plan for preventive services.  

## 2014-06-12 NOTE — Progress Notes (Signed)
Pre visit review using our clinic review tool, if applicable. No additional management support is needed unless otherwise documented below in the visit note. 

## 2014-06-12 NOTE — Progress Notes (Signed)
Subjective:    Patient ID: Kayla Mcgrath, female    DOB: 05/08/1945, 69 y.o.   MRN: [id number]  HPI  69 yo pleasant female here for annual medicare wellness visit and follow up of chronic medical conditions.  I have personally reviewed the Medicare Annual Wellness questionnaire and have noted 1. The patient's medical and social history 2. Their use of alcohol, tobacco or illicit drugs 3. Their current medications and supplements 4. The patient's functional ability including ADL's, fall risks, home safety risks and hearing or visual             impairment. 5. Diet and physical activities 6. Evidence for depression or mood disorders  End of life wishes discussed and updated in Social History.  The roster of all physicians providing medical care to patient - is listed in the CareTeams section of the chart.   Mammogram neg 05/08/14 Pneumovax 05/07/12 Prevnar 13 05/30/13 Zostavax 05/07/12 Colonoscopy 8/19/15Olevia Perches, 5 year recall Td 05/30/13 DEXA 10/02/12  Breast CA- doing well. S/p mastectomy after recurrence two years ago.  Last saw Dr. Griffith Citron on 01/27/13-  Note reviewed.  On anatrozole since 07/2012-switching to tamoxifen this month due to vaginal dryness.   Insomnia- when I saw her in 05/2013, she was complaining of more difficulty sleeping.  Benadryl was no longer effective.  Started Trazadone advised prn melatonin.  She tried Trazadone for awhile but stopped taking it- thought it was not that helpful.  Sleeping a little better now- just having more vivid dreams.  HLD-  LDL improved with Zetia.  Lab Results  Component Value Date   CHOL 212* 06/06/2014   HDL 61.20 06/06/2014   LDLCALC 135* 06/06/2014   LDLDIRECT 161.9 04/30/2012   TRIG 81.0 06/06/2014   CHOLHDL 3 06/06/2014   Lab Results  Component Value Date   NA 136 06/06/2014   K 4.3 06/06/2014   CL 103 06/06/2014   CO2 30 06/06/2014   Lab Results  Component Value Date   CREATININE 0.72 06/06/2014   Wt Readings  from Last 3 Encounters:  06/12/14 174 lb 4 oz (79.039 kg)  01/27/14 174 lb 3.2 oz (79.017 kg)  10/02/13 177 lb 4 oz (80.4 kg)    Patient Active Problem List   Diagnosis Date Noted  . Medicare annual wellness visit, subsequent 06/12/2014  . Osteopenia determined by x-ray 01/27/2014  . Insomnia 05/30/2013  . HLD (hyperlipidemia) 05/07/2012  . DNR (do not resuscitate) 05/07/2012  . Primary cancer of upper inner quadrant of right female breast 04/27/2012   Past Medical History  Diagnosis Date  . DCIS (ductal carcinoma in situ) of breast 2010  . Hyperlipidemia   . Breast cancer   . Skin cancer   . Joint pain   . Wears glasses   . Full dentures   . Colon polyps     ?  Marland Kitchen Diverticulosis    Past Surgical History  Procedure Laterality Date  . Breast lumpectomy Right december 2010    cancer-tamoxifen  . Dilation and curettage of uterus    . Colonoscopy    . Mastectomy w/ sentinel node biopsy Right 06/20/2012    Procedure: right skin sparing MASTECTOMY WITH SENTINEL LYMPH NODE BIOPSY;  Surgeon: Stark Klein, MD;  Location: [place];  Service: General;  Laterality: Right;  . Breast reconstruction with placement of tissue expander and flex hd (acellular hydrated dermis) Right 06/20/2012    Procedure: BREAST RECONSTRUCTION WITH PLACEMENT OF TISSUE EXPANDER AND FLEX HD (ACELLULAR  HYDRATED DERMIS);  Surgeon: Theodoro Kos, DO;  Location: [place];  Service: Plastics;  Laterality: Right;  . Removal of tissue expander and placement of implant Right 10/11/2012    Procedure: REMOVAL OF TISSUE EXPANDER AND PLACEMENT OF SILICONE IMPLANT RIGHT;  Surgeon: Theodoro Kos, DO;  Location: [place];  Service: Plastics;  Laterality: Right;  . Breast reduction with mastopexy Left 10/11/2012    Procedure: LEFT BREAST REDUCTION WITH MASTOPEXY;  Surgeon: Theodoro Kos, DO;  Location: [place];  Service: Plastics;  Laterality: Left;  . Liposuction  with lipofilling Left 10/11/2012    Procedure: LIPOSUCTION WITH LIPOFILLING;  Surgeon: Theodoro Kos, DO;  Location: [place];  Service: Plastics;  Laterality: Left;   History  Substance Use Topics  . Smoking status: Former Smoker    Types: Cigarettes    Quit date: 06/14/1981  . Smokeless tobacco: Never Used  . Alcohol Use: No   Family History  Problem Relation Age of Onset  . Alcohol abuse Mother   . Alcohol abuse Father     suicide  . Mental illness Father   . Colon polyps Mother   . Heart disease Paternal Grandfather   . Mental illness Mother    Allergies  Allergen Reactions  . Latex Rash   Current Outpatient Prescriptions on File Prior to Visit  Medication Sig Dispense Refill  . anastrozole (ARIMIDEX) 1 MG tablet Take 1 tablet (1 mg total) by mouth daily. 90 tablet 3  . Cholecalciferol (VITAMIN D) 2000 UNITS CAPS Take one by mouth daily    . triamcinolone cream (KENALOG) 0.1 % Apply 1 application topically as needed.    Marland Kitchen ZETIA 10 MG tablet TAKE 1 TABLET EVERY DAY 90 tablet 1   No current facility-administered medications on file prior to visit.   The PMH, PSH, Social History, Family History, Medications, and allergies have been reviewed in [facility], and have been updated if relevant.   Review of Systems  Constitutional: Negative.   HENT: Negative.   Respiratory: Negative.   Cardiovascular: Negative.   Gastrointestinal: Negative.   Endocrine: Negative.   Genitourinary: Negative for vaginal bleeding and vaginal discharge.       +vaginal dryness, atrophy  Musculoskeletal: Negative.   Skin: Negative.   Allergic/Immunologic: Negative.   Neurological: Negative.   Hematological: Negative.   Psychiatric/Behavioral: Negative.   All other systems reviewed and are negative.  See HPI      Objective:   Physical Exam  Constitutional: She is oriented to person, place, and time. She appears well-developed and well-nourished. No distress.  HENT:  Head:  Normocephalic.  Eyes: Conjunctivae are normal.  Neck: Normal range of motion. Neck supple. No thyromegaly present.  Cardiovascular: Normal rate and regular rhythm.   Pulmonary/Chest: Effort normal and breath sounds normal. No respiratory distress. She has no wheezes.  Abdominal: Soft.  Musculoskeletal: She exhibits edema.  Neurological: She is alert and oriented to person, place, and time. No cranial nerve deficit.  Skin: Skin is warm and dry.  Psychiatric: She has a normal mood and affect. Her behavior is normal. Judgment and thought content normal.  Nursing note and vitals reviewed.  BP 122/76 mmHg  Pulse 71  Temp(Src) 98 F (36.7 C) (Oral)  Ht 5' 0.25" (1.53 m)  Wt 174 lb 4 oz (79.039 kg)  BMI 33.76 kg/m2  SpO2 95% Wt Readings from Last 3 Encounters:  06/12/14 174 lb 4 oz (79.039 kg)  01/27/14 174 lb 3.2 oz (  79.017 kg)  10/02/13 177 lb 4 oz (80.4 kg)           Assessment & Plan:

## 2014-07-01 ENCOUNTER — Ambulatory Visit (HOSPITAL_BASED_OUTPATIENT_CLINIC_OR_DEPARTMENT_OTHER): Payer: Commercial Managed Care - HMO | Admitting: Oncology

## 2014-07-01 ENCOUNTER — Other Ambulatory Visit (HOSPITAL_BASED_OUTPATIENT_CLINIC_OR_DEPARTMENT_OTHER): Payer: Commercial Managed Care - HMO

## 2014-07-01 ENCOUNTER — Telehealth: Payer: Self-pay | Admitting: Oncology

## 2014-07-01 ENCOUNTER — Other Ambulatory Visit: Payer: Commercial Managed Care - HMO

## 2014-07-01 VITALS — BP 114/61 | HR 79 | Temp 98.7°F | Resp 18 | Ht 60.0 in | Wt 175.1 lb

## 2014-07-01 DIAGNOSIS — M8580 Other specified disorders of bone density and structure, unspecified site: Secondary | ICD-10-CM

## 2014-07-01 DIAGNOSIS — C50211 Malignant neoplasm of upper-inner quadrant of right female breast: Secondary | ICD-10-CM

## 2014-07-01 LAB — CBC WITH DIFFERENTIAL/PLATELET
BASO%: 0.9 % (ref 0.0–2.0)
Basophils Absolute: 0 10*3/uL (ref 0.0–0.1)
EOS%: 3.4 % (ref 0.0–7.0)
Eosinophils Absolute: 0.2 10*3/uL (ref 0.0–0.5)
HEMATOCRIT: 43.1 % (ref 34.8–46.6)
HGB: 14.2 g/dL (ref 11.6–15.9)
LYMPH%: 28.7 % (ref 14.0–49.7)
MCH: 30.4 pg (ref 25.1–34.0)
MCHC: 33.1 g/dL (ref 31.5–36.0)
MCV: 92.1 fL (ref 79.5–101.0)
MONO#: 0.6 10*3/uL (ref 0.1–0.9)
MONO%: 10.9 % (ref 0.0–14.0)
NEUT#: 2.9 10*3/uL (ref 1.5–6.5)
NEUT%: 56.1 % (ref 38.4–76.8)
PLATELETS: 218 10*3/uL (ref 145–400)
RBC: 4.68 10*6/uL (ref 3.70–5.45)
RDW: 12.9 % (ref 11.2–14.5)
WBC: 5.2 10*3/uL (ref 3.9–10.3)
lymph#: 1.5 10*3/uL (ref 0.9–3.3)

## 2014-07-01 LAB — COMPREHENSIVE METABOLIC PANEL (CC13)
ALT: 29 U/L (ref 0–55)
ANION GAP: 5 meq/L (ref 3–11)
AST: 29 U/L (ref 5–34)
Albumin: 3.8 g/dL (ref 3.5–5.0)
Alkaline Phosphatase: 99 U/L (ref 40–150)
BUN: 14.5 mg/dL (ref 7.0–26.0)
CO2: 30 meq/L — AB (ref 22–29)
CREATININE: 0.8 mg/dL (ref 0.6–1.1)
Calcium: 10.3 mg/dL (ref 8.4–10.4)
Chloride: 106 mEq/L (ref 98–109)
EGFR: 78 mL/min/{1.73_m2} — ABNORMAL LOW (ref >90)
GLUCOSE: 86 mg/dL (ref 70–140)
Potassium: 4.7 mEq/L (ref 3.5–5.1)
SODIUM: 141 meq/L (ref 136–145)
Total Bilirubin: 0.44 mg/dL (ref 0.20–1.20)
Total Protein: 6.9 g/dL (ref 6.4–8.3)

## 2014-07-01 MED ORDER — ESTRADIOL 10 MCG VA TABS: 1.0000 | ORAL_TABLET | VAGINAL | Status: DC

## 2014-07-01 MED ORDER — TAMOXIFEN CITRATE 20 MG PO TABS: 20.0000 mg | ORAL_TABLET | Freq: Every day | ORAL | Status: DC

## 2014-07-01 NOTE — Telephone Encounter (Signed)
s.w. pt husband and advised on OCT appt....pt ok and aware....emailed [place] to send records to [facility]

## 2014-07-01 NOTE — Progress Notes (Signed)
ID: Kayla Mcgrath   DOB: Jun 04, 1945  MR#: [id number]  MEQ#:[id number]  PCP: Arnette Norris, MD GYN:  SUStark Klein OTHER MD: [name]; [name] [phone]), Theodoro Kos  CHIEF COMPLAINT:  Right Breast Cancer  CURRENT TREATMENT: Anastrozole   HISTORY OF PRESENT ILLNESS: From the original intake note:  Kayla Mcgrath underwent right lumpectomy in December of 2010 for a 1.2 cm ductal carcinoma in situ, intermediate grade, with close but negative margins (1 mm). I do not have the prognostic panel, but per patient and history the tumor was estrogen receptor positive and Denim took tamoxifen for approximately one year. She declined radiation.  She was then followed closely, with mammography every 6 months. On 03/08/2012 a new area of amorphous calcifications measuring 2 mm were noted at the 9:00 position of the right breast. There was also an isodense oval mass measuring 7 mm at the 2:00 position in the same breast. Ultrasound of the right breast confirmed a 6 mm oval hypoechoic mass with microlobulated margins.  The patient was referred to the breast Center for biopsy, and on for 15 2014 underwent biopsy of the small area of calcifications in the right breast as well as the breast mass in the same breast. These showed ([id number]) invasive ductal carcinoma, grade 2 or 3, estrogen receptor 100% positive, progesterone receptor 10% positive, with an MIB-1 of 35%, and no HER-2 amplification. The small area of calcification showed only atypical ductal hyperplasia.  The patient's subsequent history is as detailed below.  INTERVAL HISTORY: Kayla Mcgrath returns today for followup of her breast cancer. She gave me a report on the cruise trip she took with 2 of her granddaughters, currently 54 and 38 years old. There went to the [place]. The girls had a great time. She highly recommended.--Aside from that she continues on anastrozole. The big concern is vaginal dryness. When Dr. Deborra Medina attempted a Pap smear  it was too painful and the procedure had to be canceled. [name] is interested in switching to tamoxifen at this time.  REVIEW OF SYSTEMS: A detailed review of systems today is otherwise noncontributory  PAST MEDICAL HISTORY: Past Medical History  Diagnosis Date  . DCIS (ductal carcinoma in situ) of breast 2010  . Hyperlipidemia   . Breast cancer   . Skin cancer   . Joint pain   . Wears glasses   . Full dentures   . Colon polyps     ?  Marland Kitchen Diverticulosis     PAST SURGICAL HISTORY: Past Surgical History  Procedure Laterality Date  . Breast lumpectomy Right december 2010    cancer-tamoxifen  . Dilation and curettage of uterus    . Colonoscopy    . Mastectomy w/ sentinel node biopsy Right 06/20/2012    Procedure: right skin sparing MASTECTOMY WITH SENTINEL LYMPH NODE BIOPSY;  Surgeon: Stark Klein, MD;  Location: [place];  Service: General;  Laterality: Right;  . Breast reconstruction with placement of tissue expander and flex hd (acellular hydrated dermis) Right 06/20/2012    Procedure: BREAST RECONSTRUCTION WITH PLACEMENT OF TISSUE EXPANDER AND FLEX HD (ACELLULAR HYDRATED DERMIS);  Surgeon: Theodoro Kos, DO;  Location: [place];  Service: Plastics;  Laterality: Right;  . Removal of tissue expander and placement of implant Right 10/11/2012    Procedure: REMOVAL OF TISSUE EXPANDER AND PLACEMENT OF SILICONE IMPLANT RIGHT;  Surgeon: Theodoro Kos, DO;  Location: [place];  Service: Plastics;  Laterality: Right;  . Breast reduction with  mastopexy Left 10/11/2012    Procedure: LEFT BREAST REDUCTION WITH MASTOPEXY;  Surgeon: Theodoro Kos, DO;  Location: [place];  Service: Plastics;  Laterality: Left;  . Liposuction with lipofilling Left 10/11/2012    Procedure: LIPOSUCTION WITH LIPOFILLING;  Surgeon: Theodoro Kos, DO;  Location: [place];  Service: Plastics;  Laterality: Left;    FAMILY HISTORY Family  History  Problem Relation Age of Onset  . Alcohol abuse Mother   . Alcohol abuse Father     suicide  . Mental illness Father   . Colon polyps Mother   . Heart disease Paternal Grandfather   . Mental illness Mother    the patient's father committed suicide at the age of 38. The patient's mother died at the age of 33 in the setting of alcohol abuse. The patient has one brother, 2 sisters. There is no history of breast or ovarian cancer in the family to the patient's knowledge.  GYNECOLOGIC HISTORY: Menarche age 45, first live birth age 87, the patient is [place] P2. She went through menopause approximately 1998. She did not take hormone replacement.  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 Her husband Collier Salina, currently retired, used to Sports administrator. Daughter Kayla Mcgrath lives in [place] and works as an Therapist, sports for [employer] and also at [employer] in [place]. Daughter Kayla Mcgrath l is a homemaker in [place]. The patient has 6 grandchildren and one on the way. She is a Tourist information centre manager.    ADVANCED DIRECTIVES: Not in place  HEALTH MAINTENANCE: (Updated 11/01/2012) History  Substance Use Topics  . Smoking status: Former Smoker    Types: Cigarettes    Quit date: 06/14/1981  . Smokeless tobacco: Never Used  . Alcohol Use: No     Colonoscopy: 2009  PAP: 2012  Bone density: Sept 2014, osteopenia  Lipid panel: April 2014, Dr. Deborra Medina   Allergies  Allergen Reactions  . Latex Rash    Current Outpatient Prescriptions  Medication Sig Dispense Refill  . anastrozole (ARIMIDEX) 1 MG tablet Take 1 tablet (1 mg total) by mouth daily. 90 tablet 3  . Cholecalciferol (VITAMIN D) 2000 UNITS CAPS Take one by mouth daily    . triamcinolone cream (KENALOG) 0.1 % Apply 1 application topically as needed.    Marland Kitchen ZETIA 10 MG tablet TAKE 1 TABLET EVERY DAY 90 tablet 1   No current facility-administered medications for this visit.    OBJECTIVE: Middle-aged white  woman in no acute distress Filed Vitals:   07/01/14 1142  BP: 114/61  Pulse: 79  Temp: 98.7 F (37.1 C)  Resp: 18     Body mass index is 34.2 kg/(m^2).    ECOG FS: 0 Filed Weights   07/01/14 1142  Weight: 175 lb 1.6 oz (79.425 kg)   Sclerae unicteric, pupils round and equal Oropharynx clear and moist-- no thrush or other lesions No cervical or supraclavicular adenopathy Lungs no rales or rhonchi Heart regular rate and rhythm Abd soft, nontender, positive bowel sounds MSK no focal spinal tenderness, no upper extremity lymphedema Neuro: nonfocal, well oriented, appropriate affect Breasts: The right breast is status post mastectomy and status post reconstruction. She is not satisfied with the cosmetic appearance. There is no evidence of chest wall recurrence however. The right axilla is benign. Left breast is unremarkable.    LAB RESULTS: Lab Results  Component Value Date   WBC 5.2 07/01/2014   NEUTROABS 2.9 07/01/2014   HGB 14.2 07/01/2014  HCT 43.1 07/01/2014   MCV 92.1 07/01/2014   PLT 218 07/01/2014      Chemistry      Component Value Date/Time   NA 136 06/06/2014 1118   NA 140 01/27/2014 0902   K 4.3 06/06/2014 1118   K 4.0 01/27/2014 0902   CL 103 06/06/2014 1118   CL 105 05/02/2012 0822   CO2 30 06/06/2014 1118   CO2 27 01/27/2014 0902   BUN 16 06/06/2014 1118   BUN 14.6 01/27/2014 0902   CREATININE 0.72 06/06/2014 1118   CREATININE 0.8 01/27/2014 0902      Component Value Date/Time   CALCIUM 10.3 06/06/2014 1118   CALCIUM 10.1 01/27/2014 0902   ALKPHOS 88 06/06/2014 1118   ALKPHOS 92 01/27/2014 0902   AST 28 06/06/2014 1118   AST 28 01/27/2014 0902   ALT 29 06/06/2014 1118   ALT 28 01/27/2014 0902   BILITOT 0.5 06/06/2014 1118   BILITOT 0.41 01/27/2014 0902       STUDIES: DIGITAL SCREENING UNILATERAL LEFT MAMMOGRAM WITH TOMO AND CAD  COMPARISON: Previous exam(s).  ACR Breast Density Category c: The breast tissue is  heterogeneously dense, which may obscure small masses.  FINDINGS: There are no findings suspicious for malignancy. There is density in the inferomedial left breast, which is new, but presumed to be from the interval breast reduction surgery. Images were processed with CAD.  IMPRESSION: No mammographic evidence of malignancy. A result letter of this screening mammogram will be mailed directly to the patient.  RECOMMENDATION: Screening mammogram in one year. (Code:SM-B-01Y)  BI-RADS CATEGORY 1: Negative.   Electronically Signed  By: Lajean Manes M.D.  On: 05/08/2013 15:52  ASSESSMENT: 69 y.o. [place] woman  (1) status post right lumpectomy December of 2010 for ductal carcinoma in situ, intermediate grade, estrogen receptor positive, with close but negative margins, the patient refusing adjuvant radiation therapy, but taking adjuvant tamoxifen approximately one year  (2) status post right breast upper inner quadrant biopsy 04/26/2012 for a clinical T1b N0, stage IA invasive ductal carcinoma, intermediate grade, estrogen receptor 100% and progesterone receptor 10% positive, with no HER-2 amplification, and an MIB-1 of 35%  (3) status post right mastectomy and sentinel lymph node sampling 06/20/2012 for a pT1b pN0, stage IA invasive ductal carcinoma, grade 2, again HER-2 negative.  (4) Oncotype DX score of 11 predicting a distant recurrence within 10 years of 8% if the patient's only adjuvant systemic treatment is tamoxifen for 5 years.  (5) began on anastrozole in July 2014, the plan being to continue for total of 5 years (until July 2019)  (6)  status post reconstruction on the right, and reduction mammoplasty on the left on 10-14 under the care of Dr. Migdalia Dk  (7) osteopenia with DEXA scan 10/02/2012 showing a T score of -2.2  PLAN:  Stormey would like to switch to tamoxifen chiefly because of the vaginal dryness issue, which is quite severe in her case. She can't  even have Pap smears as a result.  We reviewed again the possible toxicities, side effects and complications of tamoxifen. She understands in particular issues regarding endometrial hyperplasia, polyps, and the rare case of endometrial carcinoma. She will run out of her anastrozole in about 2 weeks. She can start the tamoxifen at that time.  She took tamoxifen previously, so we are not anticipating any unusual problems. We then discussed whether she would prefer the Vagifem suppositories or the Estring insert. She would like to try the suppositories. I went ahead  and wrote her the prescription cautioning her that these can be expensive. She will let me know if cost is an issue.  Otherwise she will see me again in approximately 3 months. If she is doing well with this treatment, the plan would be to continue it through July 2019 at a minimum. She could then consider whether she would like an additional 5 years of antiestrogen.  Incidentally she requested referral to a different plastic surgeon to consider revision of her right breast reconstruction. We are placing that referral for her today.   Chauncey Cruel, MD      07/01/2014

## 2014-07-03 ENCOUNTER — Telehealth: Payer: Self-pay | Admitting: Oncology

## 2014-07-03 NOTE — Telephone Encounter (Signed)
Faxed pt medical records to Dr. Maia Plan office.  They will call pt with appt.

## 2014-07-18 ENCOUNTER — Telehealth: Payer: Self-pay | Admitting: *Deleted

## 2014-07-18 NOTE — Telephone Encounter (Signed)
Received VM message from patient regarding a medication question.  It is related to the high cost of her Vagifem. She would like to try a less expensive medication.

## 2014-07-20 NOTE — Telephone Encounter (Signed)
If she is willing to try estrogen vaginal cream, please call in for her  (I believe ESTRING is about the same proce as Vagifem)  Thanks!

## 2014-07-22 ENCOUNTER — Other Ambulatory Visit: Payer: Self-pay | Admitting: *Deleted

## 2014-07-22 DIAGNOSIS — C50211 Malignant neoplasm of upper-inner quadrant of right female breast: Secondary | ICD-10-CM

## 2014-07-22 MED ORDER — ESTRADIOL 2 MG VA RING: 2.0000 mg | VAGINAL_RING | VAGINAL | Status: DC

## 2014-07-22 NOTE — Telephone Encounter (Signed)
Patient stated,"my copay for Vagifem was almost $800/ 3 months. That is to expensive." Per Dr. Jana Hakim, I E-scribed [facility] to [facility]. I instructed patient to call the office back and let us know what the copay is for Estring, in case it's to much, we can prescribe another medication. Patient verbalized understanding.

## 2014-07-30 DIAGNOSIS — Z9011 Acquired absence of right breast and nipple: Secondary | ICD-10-CM | POA: Diagnosis not present

## 2014-10-13 ENCOUNTER — Telehealth: Payer: Self-pay | Admitting: *Deleted

## 2014-10-13 NOTE — Telephone Encounter (Signed)
APPOINTMENT WITH DR.MAGRINAT IS 10/23/14 AT 9:00AM.

## 2014-10-23 ENCOUNTER — Ambulatory Visit (HOSPITAL_BASED_OUTPATIENT_CLINIC_OR_DEPARTMENT_OTHER): Payer: Commercial Managed Care - HMO | Admitting: Oncology

## 2014-10-23 VITALS — BP 139/75 | HR 72 | Temp 98.0°F | Resp 18 | Ht 60.0 in | Wt 178.0 lb

## 2014-10-23 DIAGNOSIS — M899 Disorder of bone, unspecified: Secondary | ICD-10-CM | POA: Diagnosis not present

## 2014-10-23 DIAGNOSIS — Z79810 Long term (current) use of selective estrogen receptor modulators (SERMs): Secondary | ICD-10-CM | POA: Diagnosis not present

## 2014-10-23 DIAGNOSIS — Z853 Personal history of malignant neoplasm of breast: Secondary | ICD-10-CM

## 2014-10-23 DIAGNOSIS — C50211 Malignant neoplasm of upper-inner quadrant of right female breast: Secondary | ICD-10-CM

## 2014-10-23 NOTE — Progress Notes (Signed)
ID: Kayla Mcgrath   DOB: [birth year]  MR#: [id number]  WSF#:[id number]  PCP: Arnette Norris, MD GYN:  SUStark Klein OTHER MD: [name]; [name] [phone]), Theodoro Kos, [name]  CHIEF COMPLAINT:  Right Breast Cancer  CURRENT TREATMENT: tamoxifen   HISTORY OF PRESENT ILLNESS: From the original intake note:  Kayla Mcgrath underwent right lumpectomy in December of 2010 for a 1.2 cm ductal carcinoma in situ, intermediate grade, with close but negative margins (1 mm). I do not have the prognostic panel, but per patient and history the tumor was estrogen receptor positive and Kayla Mcgrath took tamoxifen for approximately one year. She declined radiation.  She was then followed closely, with mammography every 6 months. On 03/08/2012 a new area of amorphous calcifications measuring 2 mm were noted at the 9:00 position of the right breast. There was also an isodense oval mass measuring 7 mm at the 2:00 position in the same breast. Ultrasound of the right breast confirmed a 6 mm oval hypoechoic mass with microlobulated margins.  The patient was referred to the breast Center for biopsy, and on for 15 2014 underwent biopsy of the small area of calcifications in the right breast as well as the breast mass in the same breast. These showed ([id number]) invasive ductal carcinoma, grade 2 or 3, estrogen receptor 100% positive, progesterone receptor 10% positive, with an MIB-1 of 35%, and no HER-2 amplification. The small area of calcification showed only atypical ductal hyperplasia.  The patient's subsequent history is as detailed below.  INTERVAL HISTORY: Kayla Mcgrath returns today for followup of her breast cancer. After her last visit here we switched her to tamoxifen so she could do a little bit better in terms of vaginal dryness issues. She had some hot flashes initially but those have resolved. She is obtaining the tamoxifen at no cost.  On September 1 she started using Vagifem. This was  effective but also quite expensive, more than $500 for a three-month supply. Accordingly we switched her to Estring, which is $340 for 3 months reading. This is working well for her, with no dyspareunia now. It occasionally comes a little bit out of place and she has 2 adjusted but is otherwise not uncomfortable. The only issue is the price.  Since her last visit here also she met with Dr. Isa Rankin at [facility] and they are planning some minor revisions to her prior surgical site later this year.  REVIEW OF SYSTEMS: Aside from the issues just discussed, I detailed review of systems today was noncontributory  PAST MEDICAL HISTORY: Past Medical History  Diagnosis Date  . DCIS (ductal carcinoma in situ) of breast 2010  . Hyperlipidemia   . Breast cancer   . Skin cancer   . Joint pain   . Wears glasses   . Full dentures   . Colon polyps     ?  Marland Kitchen Diverticulosis     PAST SURGICAL HISTORY: Past Surgical History  Procedure Laterality Date  . Breast lumpectomy Right december 2010    cancer-tamoxifen  . Dilation and curettage of uterus    . Colonoscopy    . Mastectomy w/ sentinel node biopsy Right 06/20/2012    Procedure: right skin sparing MASTECTOMY WITH SENTINEL LYMPH NODE BIOPSY;  Surgeon: Stark Klein, MD;  Location: [place];  Service: General;  Laterality: Right;  . Breast reconstruction with placement of tissue expander and flex hd (acellular hydrated dermis) Right 06/20/2012    Procedure: BREAST RECONSTRUCTION WITH PLACEMENT OF TISSUE  EXPANDER AND FLEX HD (ACELLULAR HYDRATED DERMIS);  Surgeon: Theodoro Kos, DO;  Location: [place];  Service: Plastics;  Laterality: Right;  . Removal of tissue expander and placement of implant Right 10/11/2012    Procedure: REMOVAL OF TISSUE EXPANDER AND PLACEMENT OF SILICONE IMPLANT RIGHT;  Surgeon: Theodoro Kos, DO;  Location: [place];  Service: Plastics;  Laterality: Right;  . Breast reduction with  mastopexy Left 10/11/2012    Procedure: LEFT BREAST REDUCTION WITH MASTOPEXY;  Surgeon: Theodoro Kos, DO;  Location: [place];  Service: Plastics;  Laterality: Left;  . Liposuction with lipofilling Left 10/11/2012    Procedure: LIPOSUCTION WITH LIPOFILLING;  Surgeon: Theodoro Kos, DO;  Location: [place];  Service: Plastics;  Laterality: Left;    FAMILY HISTORY Family History  Problem Relation Age of Onset  . Alcohol abuse Mother   . Alcohol abuse Father     suicide  . Mental illness Father   . Colon polyps Mother   . Heart disease Paternal Grandfather   . Mental illness Mother    the patient's father committed suicide at the age of 18. The patient's mother died at the age of 46 in the setting of alcohol abuse. The patient has one brother, 2 sisters. There is no history of breast or ovarian cancer in the family to the patient's knowledge.  GYNECOLOGIC HISTORY: Menarche age 34, first live birth age 82, the patient is [place] P2. She went through menopause approximately 1998. She did not take hormone replacement.  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 Her husband Kayla Mcgrath, currently retired, used to Sports administrator. Daughter Kayla Mcgrath lives in [place] and works as an Therapist, sports for [employer] and also at [employer] in [place]. Daughter Kayla Mcgrath is a homemaker in [place]. The patient has 6 grandchildren and one on the way. She is a Tourist information centre manager.    ADVANCED DIRECTIVES: Not in place  HEALTH MAINTENANCE: (Updated 11/01/2012) Social History  Substance Use Topics  . Smoking status: Former Smoker    Types: Cigarettes    Quit date: 06/14/1981  . Smokeless tobacco: Never Used  . Alcohol Use: No     Colonoscopy: 2009  PAP: 2012  Bone density: Sept 2014, osteopenia  Lipid panel: April 2014, Dr. Deborra Medina   Allergies  Allergen Reactions  . Latex Rash    Current Outpatient Prescriptions  Medication Sig Dispense  Refill  . Cholecalciferol (VITAMIN D) 2000 UNITS CAPS Take one by mouth daily    . estradiol (ESTRING) 2 MG vaginal ring Place 2 mg vaginally every 3 (three) months. follow package directions 1 each 3  . ketoconazole (NIZORAL) 2 % cream APPLY BY TOPICAL ROUTE EVERY DAY TO THE AFFECTED AREA(S)  2  . tamoxifen (NOLVADEX) 20 MG tablet Take 1 tablet (20 mg total) by mouth daily. 90 tablet 4  . triamcinolone cream (KENALOG) 0.1 % Apply 1 application topically as needed.    Marland Kitchen ZETIA 10 MG tablet TAKE 1 TABLET EVERY DAY 90 tablet 1   No current facility-administered medications for this visit.    OBJECTIVE: Middle-aged white woman who appears older than stated age 9 Vitals:   10/23/14 0903  BP: 139/75  Pulse: 72  Temp: 98 F (36.7 C)  Resp: 18     Body mass index is 34.76 kg/(m^2).    ECOG FS: 0 Filed Weights   10/23/14 0903  Weight: 178 lb (80.74 kg)   Sclerae unicteric,  EOMs intact Oropharynx clear, no thrush or other lesions No cervical or supraclavicular adenopathy Lungs no rales or rhonchi Heart regular rate and rhythm Abd soft, nontender, positive bowel sounds MSK no focal spinal tenderness, no upper extremity lymphedema Neuro: nonfocal, well oriented, appropriate affect Breasts: The right breast is status post mastectomy and reconstruction. There is a depression in the medial lower aspect which is bothersome to her cosmetically and also symmetry is poor. There is no evidence of disease recurrence. The right axilla is benign. The left breast is unremarkable.    LAB RESULTS: Lab Results  Component Value Date   WBC 5.2 07/01/2014   NEUTROABS 2.9 07/01/2014   HGB 14.2 07/01/2014   HCT 43.1 07/01/2014   MCV 92.1 07/01/2014   PLT 218 07/01/2014      Chemistry      Component Value Date/Time   NA 141 07/01/2014 1132   NA 136 06/06/2014 1118   K 4.7 07/01/2014 1132   K 4.3 06/06/2014 1118   CL 103 06/06/2014 1118   CL 105 05/02/2012 0822   CO2 30* 07/01/2014 1132    CO2 30 06/06/2014 1118   BUN 14.5 07/01/2014 1132   BUN 16 06/06/2014 1118   CREATININE 0.8 07/01/2014 1132   CREATININE 0.72 06/06/2014 1118      Component Value Date/Time   CALCIUM 10.3 07/01/2014 1132   CALCIUM 10.3 06/06/2014 1118   ALKPHOS 99 07/01/2014 1132   ALKPHOS 88 06/06/2014 1118   AST 29 07/01/2014 1132   AST 28 06/06/2014 1118   ALT 29 07/01/2014 1132   ALT 29 06/06/2014 1118   BILITOT 0.44 07/01/2014 1132   BILITOT 0.5 06/06/2014 1118       STUDIES: EXAM: DIGITAL SCREENING UNILATERAL LEFT MAMMOGRAM WITH TOMO AND CAD  COMPARISON: Previous exam(s).  ACR Breast Density Category b: There are scattered areas of fibroglandular density.  FINDINGS: There are no findings suspicious for malignancy. Images were processed with CAD.  IMPRESSION: No mammographic evidence of malignancy. A result letter of this screening mammogram will be mailed directly to the patient.  RECOMMENDATION: Screening mammogram in one year. (Code:SM-B-01Y)  BI-RADS CATEGORY 1: Negative.   Electronically Signed  By: Andres Shad  On: 05/08/2014 15:21  ASSESSMENT: 69 y.o. [place] woman  (1) status post right lumpectomy December of 2010 for ductal carcinoma in situ, intermediate grade, estrogen receptor positive, with close but negative margins, the patient refusing adjuvant radiation therapy, but taking adjuvant tamoxifen approximately one year  (2) status post right breast upper inner quadrant biopsy 04/26/2012 for a clinical T1b N0, stage IA invasive ductal carcinoma, intermediate grade, estrogen receptor 100% and progesterone receptor 10% positive, with no HER-2 amplification, and an MIB-1 of 35%  (3) status post right mastectomy and sentinel lymph node sampling 06/20/2012 for a pT1b pN0, stage IA invasive ductal carcinoma, grade 2, again HER-2 negative.  (4) Oncotype DX score of 11 predicting a distant recurrence within 10 years of 8% if the patient's only adjuvant  systemic treatment is tamoxifen for 5 years.  (5) began on anastrozole in July 2014, switched to tamoxifen June 2016 because of vaginal atrophy concerns  (6)  status post reconstruction on the right, and reduction mammoplasty on the left on 10-14 under the care of Dr. Migdalia Dk  (7) osteopenia with DEXA scan 10/02/2012 showing a T score of -2.2  PLAN:  Kaeley is tolerating tamoxifen well. She is also noticing significant improvement on the Estring. The problem is the price. I went ahead and  wrote her a prescription that she can use to "shop" add other pharmacies, particularly [facility] which you're ready belongs, and hopefully she will be able to obtain this at a cheaper price.  I gave her Dr. Maia Plan number to call so she can schedule her day surgery some time before the end of the year.  The plan is to continue anti-estrogens through July 2019. She is going to see our survivorship nurse practitioner a year from now and then she will return to see me 2 years from now. [name] knows to call for any problems that may develop before that visit.  Chauncey Cruel, MD      10/23/2014

## 2014-10-24 ENCOUNTER — Telehealth: Payer: Self-pay | Admitting: Oncology

## 2015-01-16 ENCOUNTER — Other Ambulatory Visit: Payer: Self-pay

## 2015-01-16 MED ORDER — EZETIMIBE 10 MG PO TABS: 10.0000 mg | ORAL_TABLET | Freq: Every day | ORAL | Status: DC

## 2015-01-16 NOTE — Telephone Encounter (Signed)
Pt left /vm requesting refill zetia to [facility]. Annual exam on 06/12/14. Refill per protocol; pt voiced understanding.

## 2015-03-23 DIAGNOSIS — Z0100 Encounter for examination of eyes and vision without abnormal findings: Secondary | ICD-10-CM | POA: Diagnosis not present

## 2015-04-01 ENCOUNTER — Other Ambulatory Visit: Payer: Self-pay

## 2015-04-01 DIAGNOSIS — Z1231 Encounter for screening mammogram for malignant neoplasm of breast: Secondary | ICD-10-CM

## 2015-05-11 ENCOUNTER — Ambulatory Visit
Admission: RE | Admit: 2015-05-11 | Discharge: 2015-05-11 | Disposition: A | Discharge status: Home / Self Care | Payer: Commercial Managed Care - HMO | Source: Ambulatory Visit

## 2015-05-11 DIAGNOSIS — Z1231 Encounter for screening mammogram for malignant neoplasm of breast: Secondary | ICD-10-CM | POA: Diagnosis not present

## 2015-05-25 DIAGNOSIS — L812 Freckles: Secondary | ICD-10-CM | POA: Diagnosis not present

## 2015-05-25 DIAGNOSIS — L821 Other seborrheic keratosis: Secondary | ICD-10-CM | POA: Diagnosis not present

## 2015-05-25 DIAGNOSIS — L570 Actinic keratosis: Secondary | ICD-10-CM | POA: Diagnosis not present

## 2015-05-25 DIAGNOSIS — Z85828 Personal history of other malignant neoplasm of skin: Secondary | ICD-10-CM | POA: Diagnosis not present

## 2015-06-16 ENCOUNTER — Other Ambulatory Visit: Payer: Self-pay | Admitting: *Deleted

## 2015-06-16 NOTE — Telephone Encounter (Signed)
Patient left a voicemail requesting a refill on her Zetia. Patient wants to know when she is due to have her cholesterol checked again?

## 2015-06-17 NOTE — Telephone Encounter (Signed)
Spoke to pt and advised labs are required. Pt states she has enough meds to last through the remainder of the month. Rx denied and appt scheduled.

## 2015-06-29 ENCOUNTER — Ambulatory Visit (INDEPENDENT_AMBULATORY_CARE_PROVIDER_SITE_OTHER): Payer: Commercial Managed Care - HMO | Admitting: Family Medicine

## 2015-06-29 ENCOUNTER — Encounter: Payer: Self-pay | Admitting: Family Medicine

## 2015-06-29 VITALS — BP 128/62 | HR 72 | Temp 98.0°F | Wt 172.5 lb

## 2015-06-29 DIAGNOSIS — Z1159 Encounter for screening for other viral diseases: Secondary | ICD-10-CM

## 2015-06-29 DIAGNOSIS — M2550 Pain in unspecified joint: Secondary | ICD-10-CM | POA: Diagnosis not present

## 2015-06-29 DIAGNOSIS — G8929 Other chronic pain: Secondary | ICD-10-CM | POA: Insufficient documentation

## 2015-06-29 DIAGNOSIS — E785 Hyperlipidemia, unspecified: Secondary | ICD-10-CM | POA: Diagnosis not present

## 2015-06-29 DIAGNOSIS — G4700 Insomnia, unspecified: Secondary | ICD-10-CM | POA: Diagnosis not present

## 2015-06-29 DIAGNOSIS — M25561 Pain in right knee: Secondary | ICD-10-CM

## 2015-06-29 HISTORY — DX: Pain in unspecified joint: M25.50

## 2015-06-29 LAB — COMPREHENSIVE METABOLIC PANEL
ALK PHOS: 63 U/L (ref 39–117)
ALT: 24 U/L (ref 0–35)
AST: 26 U/L (ref 0–37)
Albumin: 4.2 g/dL (ref 3.5–5.2)
BUN: 15 mg/dL (ref 6–23)
CO2: 26 mEq/L (ref 19–32)
Calcium: 10.4 mg/dL (ref 8.4–10.5)
Chloride: 102 mEq/L (ref 96–112)
Creatinine, Ser: 0.73 mg/dL (ref 0.40–1.20)
GFR: 84.13 mL/min (ref >60.00)
Glucose, Bld: 102 mg/dL — ABNORMAL HIGH (ref 70–99)
POTASSIUM: 4.4 meq/L (ref 3.5–5.1)
SODIUM: 137 meq/L (ref 135–145)
Total Bilirubin: 0.5 mg/dL (ref 0.2–1.2)
Total Protein: 7.5 g/dL (ref 6.0–8.3)

## 2015-06-29 LAB — LIPID PANEL
CHOL/HDL RATIO: 3
Cholesterol: 215 mg/dL — ABNORMAL HIGH (ref 0–200)
HDL: 66.4 mg/dL (ref >39.00)
LDL Cholesterol: 124 mg/dL — ABNORMAL HIGH (ref 0–99)
NonHDL: 149.06
Triglycerides: 125 mg/dL (ref 0.0–149.0)
VLDL: 25 mg/dL (ref 0.0–40.0)

## 2015-06-29 MED ORDER — EZETIMIBE 10 MG PO TABS: 10.0000 mg | ORAL_TABLET | Freq: Every day | ORAL | Status: DC

## 2015-06-29 NOTE — Assessment & Plan Note (Signed)
Now impacting quality of life. Probable candidate for knee surgery vs Synvisc. Refer to Dr. Lorelei Pont for consultation. The patient indicates understanding of these issues and agrees with the plan.

## 2015-06-29 NOTE — Assessment & Plan Note (Signed)
Stable. No changes today. 

## 2015-06-29 NOTE — Progress Notes (Signed)
Subjective:    Patient ID: Kayla Mcgrath, female    DOB: [birth year], 70 y.o.   MRN: [id number]  HPI  70 yo pleasant female here for follow up.    Right knee pain- injury to right knee in high school.  Told she has no cartilage in that knee now- "bone on bone." Has been aching more.  Waking her up at night.  Hindering her gardening.  Tries not to take anything for it.  Has had other arthralgias and multiple tick bites in past month.  Wants to be screened for lyme and RMSF.  No HA, blurred vision, fever, rashes or fatigue.  Breast CA- doing well. S/p mastectomy after recurrence two years ago.  Last saw Dr. Griffith Citron on 10/23/14-  Note reviewed. Now on Tamoxifen.  Anastrozole caused vaginal dryness.  Insomnia- when I saw her in 05/2013, she was complaining of more difficulty sleeping.  Benadryl was no longer effective.  Started Trazadone advised prn melatonin.  She tried Trazadone for awhile but stopped taking it- thought it was not that helpful.  Sleeping a little better now- just having more vivid dreams.  HLD-  LDL improved with Zetia.  Lab Results  Component Value Date   CHOL 212* 06/06/2014   HDL 61.20 06/06/2014   LDLCALC 135* 06/06/2014   LDLDIRECT 161.9 04/30/2012   TRIG 81.0 06/06/2014   CHOLHDL 3 06/06/2014   Lab Results  Component Value Date   NA 141 07/01/2014   K 4.7 07/01/2014   CL 103 06/06/2014   CO2 30* 07/01/2014   Lab Results  Component Value Date   CREATININE 0.8 07/01/2014   Wt Readings from Last 3 Encounters:  06/29/15 172 lb 8 oz (78.245 kg)  10/23/14 178 lb (80.74 kg)  07/01/14 175 lb 1.6 oz (79.425 kg)    Patient Active Problem List   Diagnosis Date Noted  . Medicare annual wellness visit, subsequent 06/12/2014  . Osteopenia determined by x-ray 01/27/2014  . Insomnia 05/30/2013  . HLD (hyperlipidemia) 05/07/2012  . DNR (do not resuscitate) 05/07/2012  . Primary cancer of upper inner quadrant of right female breast ([place]) 04/27/2012   Past  Medical History  Diagnosis Date  . DCIS (ductal carcinoma in situ) of breast 2010  . Hyperlipidemia   . Breast cancer ([place])   . Skin cancer   . Joint pain   . Wears glasses   . Full dentures   . Colon polyps     ?  Marland Kitchen Diverticulosis    Past Surgical History  Procedure Laterality Date  . Breast lumpectomy Right december 2010    cancer-tamoxifen  . Dilation and curettage of uterus    . Colonoscopy    . Mastectomy w/ sentinel node biopsy Right 06/20/2012    Procedure: right skin sparing MASTECTOMY WITH SENTINEL LYMPH NODE BIOPSY;  Surgeon: Stark Klein, MD;  Location: [place];  Service: General;  Laterality: Right;  . Breast reconstruction with placement of tissue expander and flex hd (acellular hydrated dermis) Right 06/20/2012    Procedure: BREAST RECONSTRUCTION WITH PLACEMENT OF TISSUE EXPANDER AND FLEX HD (ACELLULAR HYDRATED DERMIS);  Surgeon: Theodoro Kos, DO;  Location: [place];  Service: Plastics;  Laterality: Right;  . Removal of tissue expander and placement of implant Right 10/11/2012    Procedure: REMOVAL OF TISSUE EXPANDER AND PLACEMENT OF SILICONE IMPLANT RIGHT;  Surgeon: Theodoro Kos, DO;  Location: [place];  Service: Plastics;  Laterality: Right;  . Breast reduction with  mastopexy Left 10/11/2012    Procedure: LEFT BREAST REDUCTION WITH MASTOPEXY;  Surgeon: Theodoro Kos, DO;  Location: [place];  Service: Plastics;  Laterality: Left;  . Liposuction with lipofilling Left 10/11/2012    Procedure: LIPOSUCTION WITH LIPOFILLING;  Surgeon: Theodoro Kos, DO;  Location: [place];  Service: Plastics;  Laterality: Left;   Social History  Substance Use Topics  . Smoking status: Former Smoker    Types: Cigarettes    Quit date: 06/14/1981  . Smokeless tobacco: Never Used  . Alcohol Use: No   Family History  Problem Relation Age of Onset  . Alcohol abuse Mother   . Alcohol abuse Father      suicide  . Mental illness Father   . Colon polyps Mother   . Heart disease Paternal Grandfather   . Mental illness Mother    Allergies  Allergen Reactions  . Latex Rash   Current Outpatient Prescriptions on File Prior to Visit  Medication Sig Dispense Refill  . Cholecalciferol (VITAMIN D) 2000 UNITS CAPS Take one by mouth daily    . estradiol (ESTRING) 2 MG vaginal ring Place 2 mg vaginally every 3 (three) months. follow package directions 1 each 3  . ketoconazole (NIZORAL) 2 % cream APPLY BY TOPICAL ROUTE EVERY DAY TO THE AFFECTED AREA(S)  2  . tamoxifen (NOLVADEX) 20 MG tablet Take 1 tablet (20 mg total) by mouth daily. 90 tablet 4  . triamcinolone cream (KENALOG) 0.1 % Apply 1 application topically as needed.     No current facility-administered medications on file prior to visit.   The PMH, PSH, Social History, Family History, Medications, and allergies have been reviewed in [facility], and have been updated if relevant.   Review of Systems  Constitutional: Negative.  Negative for fever.  HENT: Negative.   Eyes: Negative.   Respiratory: Negative.   Cardiovascular: Negative.   Gastrointestinal: Negative.   Endocrine: Negative.   Genitourinary: Negative.  Negative for vaginal bleeding and vaginal discharge.  Musculoskeletal: Positive for arthralgias.  Skin: Negative.   Allergic/Immunologic: Negative.   Neurological: Negative.   Hematological: Negative.   Psychiatric/Behavioral: Negative.   All other systems reviewed and are negative.  See HPI      Objective:   Physical Exam  Constitutional: She is oriented to person, place, and time. She appears well-developed and well-nourished. No distress.  HENT:  Head: Normocephalic.  Eyes: Conjunctivae are normal.  Neck: Normal range of motion. Neck supple. No thyromegaly present.  Cardiovascular: Normal rate and regular rhythm.   Pulmonary/Chest: Effort normal and breath sounds normal. No respiratory distress. She has no wheezes.   Abdominal: Soft.  Musculoskeletal: She exhibits edema.  Right knee- + crepitus, no effusion, normal ROM  Neurological: She is alert and oriented to person, place, and time. No cranial nerve deficit.  Skin: Skin is warm and dry.  Psychiatric: She has a normal mood and affect. Her behavior is normal. Judgment and thought content normal.  Nursing note and vitals reviewed.  BP 128/62 mmHg  Pulse 72  Temp(Src) 98 F (36.7 C) (Oral)  Wt 172 lb 8 oz (78.245 kg)  SpO2 99% Wt Readings from Last 3 Encounters:  06/29/15 172 lb 8 oz (78.245 kg)  10/23/14 178 lb (80.74 kg)  07/01/14 175 lb 1.6 oz (79.425 kg)           Assessment & Plan:

## 2015-06-29 NOTE — Addendum Note (Signed)
Addended by: Marchia Bond on: 06/29/2015 08:46 AM   Modules accepted: Orders, SmartSet

## 2015-06-29 NOTE — Assessment & Plan Note (Signed)
Probable OA but will check RMSF and Lyme titers per pt request. The patient indicates understanding of these issues and agrees with the plan.

## 2015-06-29 NOTE — Assessment & Plan Note (Signed)
Continue current dose of zetia. Due for labs- labs today.

## 2015-06-29 NOTE — Addendum Note (Signed)
Addended by: Marchia Bond on: 06/29/2015 08:47 AM   Modules accepted: Miquel Dunn

## 2015-06-29 NOTE — Patient Instructions (Signed)
Good to see you. We will call you with your lab results.  Please schedule an appointment to see Dr. Lorelei Pont on your way out.

## 2015-06-30 LAB — LYME AB/WESTERN BLOT REFLEX

## 2015-06-30 LAB — HEPATITIS C ANTIBODY: HCV AB: NEGATIVE

## 2015-07-02 LAB — ROCKY MTN SPOTTED FVR ABS PNL(IGG+IGM)
RMSF IGM: NOT DETECTED
RMSF IgG: NOT DETECTED

## 2015-07-06 ENCOUNTER — Ambulatory Visit (INDEPENDENT_AMBULATORY_CARE_PROVIDER_SITE_OTHER)
Admission: RE | Admit: 2015-07-06 | Discharge: 2015-07-06 | Disposition: A | Discharge status: Home / Self Care | Payer: Commercial Managed Care - HMO | Source: Ambulatory Visit | Attending: Family Medicine | Admitting: Family Medicine

## 2015-07-06 ENCOUNTER — Ambulatory Visit (INDEPENDENT_AMBULATORY_CARE_PROVIDER_SITE_OTHER): Payer: Commercial Managed Care - HMO | Admitting: Family Medicine

## 2015-07-06 ENCOUNTER — Ambulatory Visit: Payer: Commercial Managed Care - HMO | Admitting: Family Medicine

## 2015-07-06 ENCOUNTER — Encounter: Payer: Self-pay | Admitting: Family Medicine

## 2015-07-06 ENCOUNTER — Other Ambulatory Visit: Payer: Self-pay | Admitting: Oncology

## 2015-07-06 VITALS — BP 120/70 | HR 83 | Temp 97.6°F | Ht 60.0 in | Wt 175.0 lb

## 2015-07-06 DIAGNOSIS — M1711 Unilateral primary osteoarthritis, right knee: Secondary | ICD-10-CM

## 2015-07-06 DIAGNOSIS — M25561 Pain in right knee: Secondary | ICD-10-CM | POA: Diagnosis not present

## 2015-07-06 DIAGNOSIS — M179 Osteoarthritis of knee, unspecified: Secondary | ICD-10-CM | POA: Diagnosis not present

## 2015-07-06 MED ORDER — TRAMADOL HCL 50 MG PO TABS: 50.0000 mg | ORAL_TABLET | Freq: Four times a day (QID) | ORAL | Status: DC | PRN

## 2015-07-06 MED ORDER — METHYLPREDNISOLONE ACETATE 40 MG/ML IJ SUSP
80.0000 mg | Freq: Once | INTRAMUSCULAR | Status: AC
  Administered 2015-07-06: 80 mg via INTRA_ARTICULAR

## 2015-07-06 NOTE — Progress Notes (Signed)
Pre visit review using our clinic review tool, if applicable. No additional management support is needed unless otherwise documented below in the visit note. 

## 2015-07-06 NOTE — Progress Notes (Signed)
Dr. Frederico Hamman T. Dorthia Tout, MD, [facility] [place], [address] Phone: [phone] Fax: [phone]  07/06/2015  Patient: Kayla Mcgrath, MRN: [id number], DOB: 05/07/1945, 70 y.o.  Primary Physician:  Arnette Norris, MD   Chief Complaint  Patient presents with  . Knee Pain    Right   Subjective:   70 year old female with remote history of right sided injury to her knee approximately 50 years ago where she was told she had a cartilage injury presents with right-sided knee pain. This is been escalating and worsening over the last weeks to months, now she is having difficulty getting down on her knee. She particular notes this when she attempts to garden, which is her past time.  She has taken some intermittent anti-inflammatories such as ibuprofen and Aleve, and these help occasionally.  She is a retired Corporate treasurer by trade.  Was on crutches for about a month fifty years ago. Since then it will always bother her.   inj R knee  The PMH, PSH, Social History, Family History, Medications, and allergies have been reviewed in [facility], and have been updated if relevant.  Patient Active Problem List   Diagnosis Date Noted  . Arthralgia 06/29/2015  . Right knee pain 06/29/2015  . Osteopenia determined by x-ray 01/27/2014  . Insomnia 05/30/2013  . HLD (hyperlipidemia) 05/07/2012  . DNR (do not resuscitate) 05/07/2012  . Primary cancer of upper inner quadrant of right female breast ([place]) 04/27/2012    Past Medical History  Diagnosis Date  . DCIS (ductal carcinoma in situ) of breast 2010  . Hyperlipidemia   . Breast cancer ([place])   . Skin cancer   . Joint pain   . Wears glasses   . Full dentures   . Colon polyps     ?  Marland Kitchen Diverticulosis     Past Surgical History  Procedure Laterality Date  . Breast lumpectomy Right december 2010    cancer-tamoxifen  . Dilation and curettage of uterus    . Colonoscopy    . Mastectomy w/ sentinel  node biopsy Right 06/20/2012    Procedure: right skin sparing MASTECTOMY WITH SENTINEL LYMPH NODE BIOPSY;  Surgeon: Stark Klein, MD;  Location: [place];  Service: General;  Laterality: Right;  . Breast reconstruction with placement of tissue expander and flex hd (acellular hydrated dermis) Right 06/20/2012    Procedure: BREAST RECONSTRUCTION WITH PLACEMENT OF TISSUE EXPANDER AND FLEX HD (ACELLULAR HYDRATED DERMIS);  Surgeon: Theodoro Kos, DO;  Location: [place];  Service: Plastics;  Laterality: Right;  . Removal of tissue expander and placement of implant Right 10/11/2012    Procedure: REMOVAL OF TISSUE EXPANDER AND PLACEMENT OF SILICONE IMPLANT RIGHT;  Surgeon: Theodoro Kos, DO;  Location: [place];  Service: Plastics;  Laterality: Right;  . Breast reduction with mastopexy Left 10/11/2012    Procedure: LEFT BREAST REDUCTION WITH MASTOPEXY;  Surgeon: Theodoro Kos, DO;  Location: [place];  Service: Plastics;  Laterality: Left;  . Liposuction with lipofilling Left 10/11/2012    Procedure: LIPOSUCTION WITH LIPOFILLING;  Surgeon: Theodoro Kos, DO;  Location: [place];  Service: Plastics;  Laterality: Left;    Social History   Social History  . Marital Status: Married    Spouse Name: N/A  . Number of Children: 2  . Years of Education: N/A   Occupational History  . retired Corporate treasurer    Social History  Main Topics  . Smoking status: Former Smoker    Types: Cigarettes    Quit date: 06/14/1981  . Smokeless tobacco: Never Used  . Alcohol Use: No  . Drug Use: No  . Sexual Activity: Yes    Birth Control/ Protection: Post-menopausal   Other Topics Concern  . Not on file   Social History Narrative   Recently moved from [place] to be closer to her two daughters and grandchildren.   Retired Corporate treasurer.   DNR- forms singed on 05/07/2012 and returned to pt.    Family History  Problem Relation Age of Onset  . Alcohol abuse  Mother   . Alcohol abuse Father     suicide  . Mental illness Father   . Colon polyps Mother   . Heart disease Paternal Grandfather   . Mental illness Mother     Allergies  Allergen Reactions  . Latex Rash    Medication list reviewed and updated in full in [facility].  ROS: no acute illness or fever MSK: above GI: tol po intake without nausea or vomitting. Neuro: no numbness, tingling, or radiculopathy O/w per hpi  Objective:   Blood pressure 120/70, pulse 83, temperature 97.6 F (36.4 C), temperature source Oral, height 5' (1.524 m), weight 175 lb (79.379 kg).  GEN: Well-developed,well-nourished,in no acute distress; alert,appropriate and cooperative throughout examination HEENT: Normocephalic and atraumatic without obvious abnormalities. Ears, externally no deformities PULM: Breathing comfortably in no respiratory distress EXT: No clubbing, cyanosis, or edema PSYCH: Normally interactive. Cooperative during the interview. Pleasant. Friendly and conversant. Not anxious or depressed appearing. Normal, full affect.  Gait: Normal heel toe pattern ROM: WNL Effusion: mild Echymosis or edema: none Patellar tendon NT Painful PLICA: neg Patellar grind: negative Medial and lateral patellar facet loading: negative medial and lateral joint lines: medial >> Lateral Mcmurray's pain Flexion-pinch pos Varus and valgus stress: stable Lachman: neg Ant and Post drawer: neg Hip abduction, IR, ER: WNL Hip flexion str: 5/5 Hip abd: 5/5 Quad: 5/5 VMO atrophy:No Hamstring concentric and eccentric: 5/5  Radiology: Dg Knee Ap/lat W/sunrise Right  07/06/2015  CLINICAL DATA:  Chronic right knee pain which is worsened over the past year without known trauma. EXAM: RIGHT KNEE 3 VIEWS COMPARISON:  None in PACs FINDINGS: The bones are adequately mineralized for age. There is moderate narrowing of the medial joint compartment with a near bone on bone appearance. There is mild lateral  subluxation of the tibial plateaus with respect to the femoral condyles. There is beaking of the tibial spines. The lateral and patellofemoral joint spaces are preserved. There are small osteophytes arising from the articular margins of the patella. There is no joint effusion. There is no acute fracture or dislocation. IMPRESSION: Moderate to severe osteoarthritic joint space loss of the medial compartment with mild lateral subluxation of the tibia with respect to the femoral condyles. There is no acute bony abnormality. Electronically Signed   By: David  Martinique M.D.   On: 07/06/2015 10:01    Assessment and Plan:   Primary osteoarthritis of right knee  Right knee pain - Plan: DG Knee AP/LAT W/Sunrise Right, methylPREDNISolone acetate (DEPO-MEDROL) injection 80 mg, CANCELED: DG Knee 4 Views W/Patella Right  Advanced medial compartmental arthritis on the right knee. Prior injury to the right knee, more likely in the medial compartment.  Continue conservative management, NSAIDs, Tylenol, ice, topicals, add tramadol when necessary.  Knee Injection, R Patient verbally consented to procedure. Risks (including potential rare risk of infection), benefits, and  alternatives explained. Sterilely prepped with Chloraprep. Ethyl cholride used for anesthesia. 8 cc Lidocaine 1% mixed with 2 mL Depo-Medrol 40 mg injected using the anteromedial approach without difficulty. No complications with procedure and tolerated well. Patient had decreased pain post-injection.   Follow-up: No Follow-up on file.  New Prescriptions   TRAMADOL (ULTRAM) 50 MG TABLET    Take 1 tablet (50 mg total) by mouth every 6 (six) hours as needed.   Orders Placed This Encounter  Procedures  . DG Knee AP/LAT W/Sunrise Right    Signed,  Kameka Whan T. Ruddy Swire, MD   Patient's Medications  New Prescriptions   TRAMADOL (ULTRAM) 50 MG TABLET    Take 1 tablet (50 mg total) by mouth every 6 (six) hours as needed.  Previous Medications    CHOLECALCIFEROL (VITAMIN D) 2000 UNITS CAPS    Take one by mouth daily   ESTRADIOL (ESTRING) 2 MG VAGINAL RING    Place 2 mg vaginally every 3 (three) months. follow package directions   EZETIMIBE (ZETIA) 10 MG TABLET    Take 1 tablet (10 mg total) by mouth daily.   KETOCONAZOLE (NIZORAL) 2 % CREAM    APPLY BY TOPICAL ROUTE EVERY DAY TO THE AFFECTED AREA(S)   TAMOXIFEN (NOLVADEX) 20 MG TABLET    Take 1 tablet (20 mg total) by mouth daily.   TRIAMCINOLONE CREAM (KENALOG) 0.1 %    Apply 1 application topically as needed.  Modified Medications   No medications on file  Discontinued Medications   No medications on file

## 2015-07-20 DIAGNOSIS — L570 Actinic keratosis: Secondary | ICD-10-CM | POA: Diagnosis not present

## 2015-10-19 DIAGNOSIS — L578 Other skin changes due to chronic exposure to nonionizing radiation: Secondary | ICD-10-CM | POA: Diagnosis not present

## 2015-10-21 ENCOUNTER — Other Ambulatory Visit: Payer: Self-pay | Admitting: Adult Health

## 2015-10-21 DIAGNOSIS — C50211 Malignant neoplasm of upper-inner quadrant of right female breast: Secondary | ICD-10-CM

## 2015-10-22 ENCOUNTER — Other Ambulatory Visit (HOSPITAL_BASED_OUTPATIENT_CLINIC_OR_DEPARTMENT_OTHER): Payer: Commercial Managed Care - HMO

## 2015-10-22 DIAGNOSIS — C50211 Malignant neoplasm of upper-inner quadrant of right female breast: Secondary | ICD-10-CM

## 2015-10-22 LAB — CBC WITH DIFFERENTIAL/PLATELET
BASO%: 1.1 % (ref 0.0–2.0)
Basophils Absolute: 0 10*3/uL (ref 0.0–0.1)
EOS ABS: 0.1 10*3/uL (ref 0.0–0.5)
EOS%: 3 % (ref 0.0–7.0)
HCT: 41.7 % (ref 34.8–46.6)
HGB: 13.7 g/dL (ref 11.6–15.9)
LYMPH%: 37.9 % (ref 14.0–49.7)
MCH: 30.3 pg (ref 25.1–34.0)
MCHC: 32.8 g/dL (ref 31.5–36.0)
MCV: 92.4 fL (ref 79.5–101.0)
MONO#: 0.4 10*3/uL (ref 0.1–0.9)
MONO%: 12.4 % (ref 0.0–14.0)
NEUT%: 45.6 % (ref 38.4–76.8)
NEUTROS ABS: 1.6 10*3/uL (ref 1.5–6.5)
PLATELETS: 156 10*3/uL (ref 145–400)
RBC: 4.52 10*6/uL (ref 3.70–5.45)
RDW: 13.3 % (ref 11.2–14.5)
WBC: 3.6 10*3/uL — ABNORMAL LOW (ref 3.9–10.3)
lymph#: 1.4 10*3/uL (ref 0.9–3.3)

## 2015-10-22 LAB — COMPREHENSIVE METABOLIC PANEL
ALT: 26 U/L (ref 0–55)
ANION GAP: 9 meq/L (ref 3–11)
AST: 27 U/L (ref 5–34)
Albumin: 3.6 g/dL (ref 3.5–5.0)
Alkaline Phosphatase: 59 U/L (ref 40–150)
BILIRUBIN TOTAL: 0.47 mg/dL (ref 0.20–1.20)
BUN: 10.8 mg/dL (ref 7.0–26.0)
CO2: 25 meq/L (ref 22–29)
Calcium: 9.6 mg/dL (ref 8.4–10.4)
Chloride: 106 mEq/L (ref 98–109)
Creatinine: 0.7 mg/dL (ref 0.6–1.1)
EGFR: 87 mL/min/{1.73_m2} — ABNORMAL LOW (ref >90)
Glucose: 101 mg/dl (ref 70–140)
Potassium: 4.3 mEq/L (ref 3.5–5.1)
Sodium: 140 mEq/L (ref 136–145)
Total Protein: 6.6 g/dL (ref 6.4–8.3)

## 2015-10-29 ENCOUNTER — Ambulatory Visit (HOSPITAL_BASED_OUTPATIENT_CLINIC_OR_DEPARTMENT_OTHER): Payer: Commercial Managed Care - HMO | Admitting: Adult Health

## 2015-10-29 ENCOUNTER — Encounter: Payer: Self-pay | Admitting: Adult Health

## 2015-10-29 VITALS — BP 141/75 | HR 85 | Temp 98.7°F | Resp 18 | Ht 60.0 in | Wt 161.4 lb

## 2015-10-29 DIAGNOSIS — Z780 Asymptomatic menopausal state: Secondary | ICD-10-CM

## 2015-10-29 DIAGNOSIS — Z1231 Encounter for screening mammogram for malignant neoplasm of breast: Secondary | ICD-10-CM | POA: Diagnosis not present

## 2015-10-29 DIAGNOSIS — C50211 Malignant neoplasm of upper-inner quadrant of right female breast: Secondary | ICD-10-CM | POA: Diagnosis not present

## 2015-10-29 DIAGNOSIS — M8580 Other specified disorders of bone density and structure, unspecified site: Secondary | ICD-10-CM

## 2015-10-29 NOTE — Progress Notes (Signed)
CLINIC:  Survivorship   REASON FOR VISIT:  Routine follow-up for history of breast cancer.   BRIEF ONCOLOGIC HISTORY:  (From Dr. Virgie Dad last note on 10/23/14)    INTERVAL HISTORY:  Kayla Mcgrath presents to the [facility] today for routine follow-up for her history of breast cancer.  Overall, she reports feeling quite well. She remains on the tamoxifen with great tolerance. She denies any bothersome hot flashes. She is no longer using the Estring, as it was too expensive. She tells me that her vaginal dryness is better since switching to the tamoxifen.  She uses vaginal moisturizers occasionally and lubricants with intercourse. She denies any dyspareunia.  She has intermittent left knee pain, particularly with activity. She tells me that she will need a knee replacement in the future. The knee pain makes exercise difficult for her. She tells me she has lost some weight recently, which she has been trying to do with a low carb diet. Her appetite is good. Her energy levels are good. She does not always sleep well at night, but this is not new for her.   REVIEW OF SYSTEMS:  Review of Systems  Constitutional: Negative.   HENT: Negative.   Eyes: Negative.   Respiratory: Negative.   Cardiovascular: Negative.   Gastrointestinal: Negative.   Genitourinary: Negative.   Musculoskeletal: Positive for joint pain.  Skin: Negative.   Neurological: Negative.   Endo/Heme/Allergies: Negative.   Psychiatric/Behavioral: The patient has insomnia.   GU: Denies vaginal bleeding or discharge. (+) vaginal dryness.  Breast: Denies any new nodularity, masses, tenderness, nipple changes, or nipple discharge.    A 14-point review of systems was completed and was negative, except as noted above.    PAST MEDICAL/SURGICAL HISTORY:  Past Medical History:  Diagnosis Date  . Breast cancer ([place])   . Colon polyps    ?  Marland Kitchen DCIS (ductal carcinoma in situ) of breast 2010  . Diverticulosis   . Full  dentures   . Hyperlipidemia   . Joint pain   . Skin cancer   . Wears glasses    Past Surgical History:  Procedure Laterality Date  . BREAST LUMPECTOMY Right december 2010   cancer-tamoxifen  . BREAST RECONSTRUCTION WITH PLACEMENT OF TISSUE EXPANDER AND FLEX HD (ACELLULAR HYDRATED DERMIS) Right 06/20/2012   Procedure: BREAST RECONSTRUCTION WITH PLACEMENT OF TISSUE EXPANDER AND FLEX HD (ACELLULAR HYDRATED DERMIS);  Surgeon: Theodoro Kos, DO;  Location: [place];  Service: Plastics;  Laterality: Right;  . BREAST REDUCTION WITH MASTOPEXY Left 10/11/2012   Procedure: LEFT BREAST REDUCTION WITH MASTOPEXY;  Surgeon: Theodoro Kos, DO;  Location: [place];  Service: Plastics;  Laterality: Left;  . COLONOSCOPY    . DILATION AND CURETTAGE OF UTERUS    . LIPOSUCTION WITH LIPOFILLING Left 10/11/2012   Procedure: LIPOSUCTION WITH LIPOFILLING;  Surgeon: Theodoro Kos, DO;  Location: [place];  Service: Plastics;  Laterality: Left;  Marland Kitchen MASTECTOMY W/ SENTINEL NODE BIOPSY Right 06/20/2012   Procedure: right skin sparing MASTECTOMY WITH SENTINEL LYMPH NODE BIOPSY;  Surgeon: Stark Klein, MD;  Location: [place];  Service: General;  Laterality: Right;  . REMOVAL OF TISSUE EXPANDER AND PLACEMENT OF IMPLANT Right 10/11/2012   Procedure: REMOVAL OF TISSUE EXPANDER AND PLACEMENT OF SILICONE IMPLANT RIGHT;  Surgeon: Theodoro Kos, DO;  Location: [place];  Service: Plastics;  Laterality: Right;     ALLERGIES:  Allergies  Allergen Reactions  . Latex Rash  CURRENT MEDICATIONS:  Outpatient Encounter Prescriptions as of 10/29/2015  Medication Sig Note  . Cholecalciferol (VITAMIN D) 2000 UNITS CAPS Take one by mouth daily   . estradiol (ESTRING) 2 MG vaginal ring Place 2 mg vaginally every 3 (three) months. follow package directions   . ezetimibe (ZETIA) 10 MG tablet Take 1 tablet (10 mg total) by mouth daily.   .  ketoconazole (NIZORAL) 2 % cream APPLY BY TOPICAL ROUTE EVERY DAY TO THE AFFECTED AREA(S) 07/01/2014: Received from: External Pharmacy  . tamoxifen (NOLVADEX) 20 MG tablet TAKE 1 TABLET EVERY DAY   . traMADol (ULTRAM) 50 MG tablet Take 1 tablet (50 mg total) by mouth every 6 (six) hours as needed.   . triamcinolone cream (KENALOG) 0.1 % Apply 1 application topically as needed. 07/31/2013: Received from: External Pharmacy Received Sig:    No facility-administered encounter medications on file as of 10/29/2015.      ONCOLOGIC FAMILY HISTORY:  Family History  Problem Relation Age of Onset  . Alcohol abuse Mother   . Alcohol abuse Father     suicide  . Mental illness Father   . Colon polyps Mother   . Heart disease Paternal Grandfather   . Mental illness Mother     GENETIC COUNSELING/TESTING: No records available for review.   SOCIAL HISTORY:  Zykeria M Halseth is married and lives with her husband in [place], [place].  They will be celebrating their 50th wedding anniversary in 02/2016.  They have 2 daughters; 1 daughter is a homemaker and has 8 children.  Her other daughter is a nurse at [employer] in women's health and has 1 child. Therefore, the patient has 9 grandchildren total. She is a retired nurse.  She has a remote history of smoking; quit in 1983. She denies any current tobacco use. She does not drink alcohol or use illicit drugs.   PHYSICAL EXAMINATION:  Vital Signs: Vitals:   10/29/15 0924  BP: (!) 141/75  Pulse: 85  Resp: 18  Temp: 98.7 F (37.1 C)   Filed Weights   10/29/15 0924  Weight: 161 lb 6.4 oz (73.2 kg)   General: Well-nourished, well-appearing female in no acute distress.  She is unaccompanied today.   HEENT: Head is normocephalic.  Pupils equal and reactive to light. Conjunctivae clear without exudate.  Sclerae anicteric. Oral mucosa is pink, moist.  Oropharynx is pink without lesions or erythema.  Lymph: No cervical, supraclavicular, or infraclavicular  lymphadenopathy noted on palpation.  Cardiovascular: Regular rate and rhythm.. Respiratory: Clear to auscultation bilaterally. Chest expansion symmetric; breathing non-labored.  Breast Exam:  -Left breast: No appreciable masses on palpation. No skin redness, thickening, or peau d'orange appearance; no nipple retraction or nipple discharge; mild distortion in symmetry with h/o mammoplasty. Healed scars without nodularity or erythema.  -Right breast: s/p mastectomy with reconstruction. Mild distortion in symmetry. No appreciable masses on palpation. Healed scars without nodularity.  -Axilla: No axillary adenopathy bilaterally.  GI: Abdomen soft and round; non-tender, non-distended. Bowel sounds normoactive. No hepatosplenomegaly.   GU: Deferred.  Neuro: No focal deficits. Steady gait.  Psych: Mood and affect normal and appropriate for situation.  Extremities: No edema. Skin: Warm and dry.  LABORATORY DATA:  CBC    Component Value Date/Time   WBC 3.6 (L) 10/22/2015 0917   WBC 4.3 06/06/2014 1118   RBC 4.52 10/22/2015 0917   RBC 4.69 06/06/2014 1118   HGB 13.7 10/22/2015 0917   HCT 41.7 10/22/2015 0917   PLT 156 10/22/2015 0917     MCV 92.4 10/22/2015 0917   MCH 30.3 10/22/2015 0917   MCHC 32.8 10/22/2015 0917   MCHC 34.2 06/06/2014 1118   RDW 13.3 10/22/2015 0917   LYMPHSABS 1.4 10/22/2015 0917   MONOABS 0.4 10/22/2015 0917   EOSABS 0.1 10/22/2015 0917   BASOSABS 0.0 10/22/2015 0917   CMP Latest Ref Rng & Units 10/22/2015 06/29/2015 07/01/2014  Glucose 70 - 140 mg/dl 101 102(H) 86  BUN 7.0 - 26.0 mg/dL 10.8 15 14.5  Creatinine 0.6 - 1.1 mg/dL 0.7 0.73 0.8  Sodium 136 - 145 mEq/L 140 137 141  Potassium 3.5 - 5.1 mEq/L 4.3 4.4 4.7  Chloride 96 - 112 mEq/L - 102 -  CO2 22 - 29 mEq/L 25 26 30(H)  Calcium 8.4 - 10.4 mg/dL 9.6 10.4 10.3  Total Protein 6.4 - 8.3 g/dL 6.6 7.5 6.9  Total Bilirubin 0.20 - 1.20 mg/dL 0.47 0.5 0.44  Alkaline Phos 40 - 150 U/L 59 63 99  AST 5 - 34 U/L 27  26 29  ALT 0 - 55 U/L 26 24 29    *Labs reviewed in detail with the patient during today's visit. Noted very mild decrease in WBCs; will continue to monitor.   DIAGNOSTIC IMAGING:  Most recent mammogram, left breast: 05/11/15     ASSESSMENT AND PLAN:  Ms.. Vogler is a pleasant 69 y.o. female with initial history of right breast DCIS, diagnosed in 10/2008, which was treated with lumpectomy and Tamoxifen x 1 year (she declined adjuvant radiation therapy at that time).  Her most recent diagnosis was Stage IA right breast invasive ductal carcinoma, ER+/PR+/HER2-, diagnosed in 04/2012; treated with right mastectomy with reconstruction (and left breast mammoplasty) and anti-estrogen therapy. She started Anastrozole in 07/2012, but was switched to Tamoxifen in 06/2014 d/t concerns regarding vaginal atrophy.  She remains on the Tamoxifen at this time.  She presents to the [facility] for surveillance and routine follow-up.   1. History of Stage IA right breast cancer:  Ms. Toole is currently clinically and radiographically without evidence of disease or recurrence of breast cancer. She will be due for left breast unilateral screening mammogram in 05/2016; orders placed today. She will follow-up with Dr. Magrinat in 10/2016 with labs preceding this visit.  She will continue on the tamoxifen, with the goal of remaining on anti-estrogen therapy through 07/2017. I encouraged her to call with any questions or concerns that may arise before her next visit to the cancer center, and I would be happy to see her sooner if needed.  2. Vaginal dryness: Ms. Eve has had a long history with vaginal dryness. She tells me that the dryness is improved since switching back to tamoxifen from the anastrozole. She uses over-the-counter vaginal moisturizers, but not consistently. She uses lubricants for intercourse. I encouraged her to try coconut oil as both a vaginal moisturizer and lubricant. She was given instructions on  how to use and the rationale for this intervention. The coconut oil is also a cheaper alternative to the pharmacy brand vaginal moisturizers and lubricants. She agrees to give the coconut oil a try.  3. Bone health:  Given Ms. Swilling's age & history of breast cancer, she is at risk for bone demineralization. Her last DEXA scan was on 10/02/12 and showed osteopenia. We reviewed that tamoxifen offers the positive side effect of bone strengthening, when compared to aromatase inhibitors. She would like to know when her next DEXA scan would be due. We discussed that because she is postmenopausal and   has osteopenia, I will order DEXA scan for follow-up evaluation. In the meantime, she was encouraged to increase her consumption of foods rich in calcium, as well as increase her weight-bearing activities.  She was given education on specific food and activities to promote bone health.  4. Health maintenance and wellness promotion: Ms. Doolittle was encouraged to consume 5-7 servings of fruits and vegetables per day. She has had her annual flu shot for this year. She was also encouraged to engage in moderate to vigorous exercise for 30 minutes per day most days of the week. Walking and weightbearing exercises are difficult for the patient, given her left knee pain/arthritis. I encouraged her to try the yoga or tai chi classes here at the cancer center, as they may help provide additional strengthening and stretching for her. [facility] also [facility], and I encouraged her to try swimming her water aerobics. She was instructed to limit her alcohol consumption and continue to abstain from tobacco use.    Dispo:  -Left breast unilateral screening mammogram due in 05/2016; orders placed today. -DEXA scan due; orders placed today to coordinate with upcoming mammogram in 05/2016.  -Return to cancer center to see Dr. Magrinat and 10/2016.   A total of 25 minutes of face-to-face time was spent with this patient with  greater than 50% of that time in counseling and care-coordination.   Gretchen Dawson, NP [facility] [phone]   Note: PRIMARY CARE PROVIDER Talia Aron, MD [phone] 

## 2015-12-31 ENCOUNTER — Encounter: Payer: Self-pay | Admitting: Family Medicine

## 2015-12-31 ENCOUNTER — Ambulatory Visit (INDEPENDENT_AMBULATORY_CARE_PROVIDER_SITE_OTHER): Payer: Commercial Managed Care - HMO | Admitting: Family Medicine

## 2015-12-31 VITALS — BP 128/70 | HR 81 | Wt 156.0 lb

## 2015-12-31 DIAGNOSIS — M1711 Unilateral primary osteoarthritis, right knee: Secondary | ICD-10-CM

## 2015-12-31 MED ORDER — TRAMADOL HCL 50 MG PO TABS: 50.0000 mg | ORAL_TABLET | Freq: Four times a day (QID) | ORAL | 2 refills | Status: DC | PRN

## 2015-12-31 NOTE — Progress Notes (Signed)
Dr. Frederico Hamman T. Arlethia Basso, MD, [facility] [place], [address] Phone: [phone] Fax: [phone]  12/31/2015  Patient: Kayla Mcgrath, MRN: [id number], DOB: 1945/04/08, 70 y.o.  Primary Physician:  Arnette Norris, MD   Chief Complaint  Patient presents with  . Knee Pain    right    Subjective:   Kayla Mcgrath is a 70 y.o. very pleasant female patient who presents with the following:  Cortisone injection lasted about 6 months. Going on a cruise in January to [place].  I remember the patient quite well.  She has known osteoarthritis on the right knee that is reasonably severe.  She had symptom relief for 6 months with corticosteroid injection on last office visit.  She is getting ready to go on a cruise, and wanted to see if she could become more asymptomatic.  Symptoms have returned within the last 2 weeks.  She's not had any trauma or other injuries.  R knee. inj  Past Medical History, Surgical History, Social History, Family History, Problem List, Medications, and Allergies have been reviewed and updated if relevant.  Patient Active Problem List   Diagnosis Date Noted  . Arthralgia 06/29/2015  . Right knee pain 06/29/2015  . Osteopenia determined by x-ray 01/27/2014  . Insomnia 05/30/2013  . HLD (hyperlipidemia) 05/07/2012  . DNR (do not resuscitate) 05/07/2012  . Primary cancer of upper inner quadrant of right female breast ([place]) 04/27/2012    Past Medical History:  Diagnosis Date  . Breast cancer ([place])   . Colon polyps    ?  Marland Kitchen DCIS (ductal carcinoma in situ) of breast 2010  . Diverticulosis   . Full dentures   . Hyperlipidemia   . Joint pain   . Skin cancer   . Wears glasses     Past Surgical History:  Procedure Laterality Date  . BREAST LUMPECTOMY Right december 2010   cancer-tamoxifen  . BREAST RECONSTRUCTION WITH PLACEMENT OF TISSUE EXPANDER AND FLEX HD (ACELLULAR HYDRATED DERMIS) Right  06/20/2012   Procedure: BREAST RECONSTRUCTION WITH PLACEMENT OF TISSUE EXPANDER AND FLEX HD (ACELLULAR HYDRATED DERMIS);  Surgeon: Theodoro Kos, DO;  Location: [facility];  Service: Plastics;  Laterality: Right;  . BREAST REDUCTION WITH MASTOPEXY Left 10/11/2012   Procedure: LEFT BREAST REDUCTION WITH MASTOPEXY;  Surgeon: Theodoro Kos, DO;  Location: [facility];  Service: Plastics;  Laterality: Left;  . COLONOSCOPY    . DILATION AND CURETTAGE OF UTERUS    . LIPOSUCTION WITH LIPOFILLING Left 10/11/2012   Procedure: LIPOSUCTION WITH LIPOFILLING;  Surgeon: Theodoro Kos, DO;  Location: [facility];  Service: Plastics;  Laterality: Left;  Marland Kitchen MASTECTOMY W/ SENTINEL NODE BIOPSY Right 06/20/2012   Procedure: right skin sparing MASTECTOMY WITH SENTINEL LYMPH NODE BIOPSY;  Surgeon: Stark Klein, MD;  Location: [facility];  Service: General;  Laterality: Right;  . REMOVAL OF TISSUE EXPANDER AND PLACEMENT OF IMPLANT Right 10/11/2012   Procedure: REMOVAL OF TISSUE EXPANDER AND PLACEMENT OF SILICONE IMPLANT RIGHT;  Surgeon: Theodoro Kos, DO;  Location: [facility];  Service: Plastics;  Laterality: Right;    Social History   Social History  . Marital status: Married    Spouse name: N/A  . Number of children: 2  . Years of education: N/A   Occupational History  . retired Corporate treasurer    Social History Main Topics  . Smoking status: Former Smoker  Types: Cigarettes    Quit date: 06/14/1981  . Smokeless tobacco: Never Used  . Alcohol use No  . Drug use: No  . Sexual activity: Yes    Birth control/ protection: Post-menopausal   Other Topics Concern  . Not on file   Social History Narrative   Recently moved from [place] to be closer to her two daughters and grandchildren.   Retired Corporate treasurer.   DNR- forms singed on 05/07/2012 and returned to pt.    Family History  Problem Relation Age of Onset  . Alcohol abuse Mother   . Colon polyps Mother    . Mental illness Mother   . Alcohol abuse Father     suicide  . Mental illness Father   . Heart disease Paternal Grandfather     Allergies  Allergen Reactions  . Latex Rash    Medication list reviewed and updated in full in [facility].  GEN: No fevers, chills. Nontoxic. Primarily MSK c/o today. MSK: Detailed in the HPI GI: tolerating PO intake without difficulty Neuro: No numbness, parasthesias, or tingling associated. Otherwise the pertinent positives of the ROS are noted above.   Objective:   BP 128/70   Pulse 81   Wt 156 lb (70.8 kg)   SpO2 (!) 81%   BMI 30.47 kg/m    GEN: WDWN, NAD, Non-toxic, Alert & Oriented x 3 HEENT: Atraumatic, Normocephalic.  Ears and Nose: No external deformity. EXTR: No clubbing/cyanosis/edema NEURO: Normal gait.  PSYCH: Normally interactive. Conversant. Not depressed or anxious appearing.  Calm demeanor.   Knee:  R Gait: Normal heel toe pattern ROM: 0-120 Effusion: neg Echymosis or edema: none Patellar tendon NT Painful PLICA: neg Patellar grind: negative Medial and lateral patellar facet loading: negative medial and lateral joint lines:NT Mcmurray's neg Flexion-pinch neg Varus and valgus stress: stable Lachman: neg Ant and Post drawer: neg Hip abduction, IR, ER: WNL Hip flexion str: 5/5 Hip abd: 5/5 Quad: 5/5 VMO atrophy:No Hamstring concentric and eccentric: 5/5   Radiology: Dg Knee Ap/lat W/sunrise Right  Result Date: 07/06/2015 CLINICAL DATA:  Chronic right knee pain which is worsened over the past year without known trauma. EXAM: RIGHT KNEE 3 VIEWS COMPARISON:  None in PACs FINDINGS: The bones are adequately mineralized for age. There is moderate narrowing of the medial joint compartment with a near bone on bone appearance. There is mild lateral subluxation of the tibial plateaus with respect to the femoral condyles. There is beaking of the tibial spines. The lateral and patellofemoral joint spaces are  preserved. There are small osteophytes arising from the articular margins of the patella. There is no joint effusion. There is no acute fracture or dislocation. IMPRESSION: Moderate to severe osteoarthritic joint space loss of the medial compartment with mild lateral subluxation of the tibia with respect to the femoral condyles. There is no acute bony abnormality. Electronically Signed   By: David  Martinique M.D.   On: 07/06/2015 10:01   Assessment and Plan:   Primary osteoarthritis of right knee  Continue with conservative care.  Weight loss.  Strengthening, exercise as tolerated.  Knee Injection, R Patient verbally consented to procedure. Risks (including potential rare risk of infection), benefits, and alternatives explained. Sterilely prepped with Chloraprep. Ethyl cholride used for anesthesia. 8 cc Lidocaine 1% mixed with 2 mL Depo-Medrol 40 mg injected using the anteromedial approach without difficulty. No complications with procedure and tolerated well. Patient had decreased pain post-injection.   Follow-up: No Follow-up on file.  Meds ordered this  encounter  Medications  . traMADol (ULTRAM) 50 MG tablet    Sig: Take 1 tablet (50 mg total) by mouth every 6 (six) hours as needed.    Dispense:  50 tablet    Refill:  2   Medications Discontinued During This Encounter  Medication Reason  . traMADol (ULTRAM) 50 MG tablet Reorder   No orders of the defined types were placed in this encounter.   Signed,  Maud Deed. Hulen Mandler, MD   Allergies as of 12/31/2015      Reactions   Latex Rash      Medication List       Accurate as of 12/31/15 11:59 PM. Always use your most recent med list.          ezetimibe 10 MG tablet Commonly known as:  ZETIA Take 1 tablet (10 mg total) by mouth daily.   ketoconazole 2 % cream Commonly known as:  NIZORAL APPLY BY TOPICAL ROUTE EVERY DAY TO THE AFFECTED AREA(S)   tamoxifen 20 MG tablet Commonly known as:  NOLVADEX TAKE 1 TABLET EVERY  DAY   traMADol 50 MG tablet Commonly known as:  ULTRAM Take 1 tablet (50 mg total) by mouth every 6 (six) hours as needed.   Vitamin D 2000 units Caps Take one by mouth daily

## 2016-01-14 ENCOUNTER — Telehealth: Payer: Self-pay

## 2016-01-14 NOTE — Telephone Encounter (Signed)
ERROR, WRONG DOCUMENTATION.

## 2016-01-14 NOTE — Telephone Encounter (Deleted)
Pt called states she is needing a prescription for a walker ASAP she said the rx that was given to her was wrong and she is needing a new one for a 4 wheel rolling walker with a seat. Please fax to advance at [phone].

## 2016-01-18 ENCOUNTER — Telehealth: Payer: Self-pay | Admitting: Family Medicine

## 2016-01-18 MED ORDER — EZETIMIBE 10 MG PO TABS: 10.0000 mg | ORAL_TABLET | Freq: Every day | ORAL | 0 refills | Status: DC

## 2016-01-18 NOTE — Telephone Encounter (Signed)
Spoke to pt and advised per Dr Deborra Medina. Per chart Rx was not written for DAW and pt has been receiving generic. Advised pt to contact insurance co and verify which like medication is covered in its place. At pts request, 30D supply sent to pharmacy and she states she will contact them and contact office back for next months Rx

## 2016-01-18 NOTE — Telephone Encounter (Signed)
Pt would like to have zetia changed to a different medication because this year the cost is too high.  cb number is [phone] and [phone]

## 2016-01-18 NOTE — Telephone Encounter (Signed)
Some insurance plans are covering generic ezetimibe.  Do we know which her insurance plan prefers?  If they do not cover ezetimibe, we will need to prescribe a statin.

## 2016-02-16 ENCOUNTER — Other Ambulatory Visit: Payer: Self-pay | Admitting: Family Medicine

## 2016-03-21 ENCOUNTER — Other Ambulatory Visit: Payer: Self-pay | Admitting: Family Medicine

## 2016-03-21 DIAGNOSIS — E785 Hyperlipidemia, unspecified: Secondary | ICD-10-CM

## 2016-03-21 DIAGNOSIS — Z01419 Encounter for gynecological examination (general) (routine) without abnormal findings: Secondary | ICD-10-CM | POA: Insufficient documentation

## 2016-03-25 ENCOUNTER — Other Ambulatory Visit (INDEPENDENT_AMBULATORY_CARE_PROVIDER_SITE_OTHER): Payer: Medicare HMO

## 2016-03-25 DIAGNOSIS — Z01419 Encounter for gynecological examination (general) (routine) without abnormal findings: Secondary | ICD-10-CM | POA: Diagnosis not present

## 2016-03-25 LAB — LIPID PANEL
CHOL/HDL RATIO: 3
Cholesterol: 192 mg/dL (ref 0–200)
HDL: 67 mg/dL (ref >39.00)
LDL CALC: 109 mg/dL — AB (ref 0–99)
NONHDL: 125.3
Triglycerides: 80 mg/dL (ref 0.0–149.0)
VLDL: 16 mg/dL (ref 0.0–40.0)

## 2016-03-25 LAB — COMPREHENSIVE METABOLIC PANEL
ALT: 17 U/L (ref 0–35)
AST: 23 U/L (ref 0–37)
Albumin: 4 g/dL (ref 3.5–5.2)
Alkaline Phosphatase: 56 U/L (ref 39–117)
BILIRUBIN TOTAL: 0.5 mg/dL (ref 0.2–1.2)
BUN: 16 mg/dL (ref 6–23)
CO2: 28 meq/L (ref 19–32)
Calcium: 10.3 mg/dL (ref 8.4–10.5)
Chloride: 103 mEq/L (ref 96–112)
Creatinine, Ser: 0.72 mg/dL (ref 0.40–1.20)
GFR: 85.29 mL/min (ref >60.00)
GLUCOSE: 93 mg/dL (ref 70–99)
Potassium: 4.2 mEq/L (ref 3.5–5.1)
Sodium: 136 mEq/L (ref 135–145)
Total Protein: 6.9 g/dL (ref 6.0–8.3)

## 2016-03-25 LAB — CBC WITH DIFFERENTIAL/PLATELET
BASOS PCT: 1.1 % (ref 0.0–3.0)
Basophils Absolute: 0 10*3/uL (ref 0.0–0.1)
EOS ABS: 0.1 10*3/uL (ref 0.0–0.7)
Eosinophils Relative: 3.3 % (ref 0.0–5.0)
HCT: 42.4 % (ref 36.0–46.0)
HEMOGLOBIN: 14.2 g/dL (ref 12.0–15.0)
Lymphocytes Relative: 28.4 % (ref 12.0–46.0)
Lymphs Abs: 1.2 10*3/uL (ref 0.7–4.0)
MCHC: 33.5 g/dL (ref 30.0–36.0)
MCV: 94.8 fl (ref 78.0–100.0)
Monocytes Absolute: 0.4 10*3/uL (ref 0.1–1.0)
Monocytes Relative: 9.7 % (ref 3.0–12.0)
NEUTROS PCT: 57.5 % (ref 43.0–77.0)
Neutro Abs: 2.4 10*3/uL (ref 1.4–7.7)
Platelets: 187 10*3/uL (ref 150.0–400.0)
RBC: 4.47 Mil/uL (ref 3.87–5.11)
RDW: 12.8 % (ref 11.5–15.5)
WBC: 4.3 10*3/uL (ref 4.0–10.5)

## 2016-03-25 LAB — TSH: TSH: 1.15 u[IU]/mL (ref 0.35–4.50)

## 2016-03-30 ENCOUNTER — Encounter: Payer: Commercial Managed Care - HMO | Admitting: Family Medicine

## 2016-04-05 ENCOUNTER — Encounter: Payer: Self-pay | Admitting: Family Medicine

## 2016-04-05 ENCOUNTER — Ambulatory Visit (INDEPENDENT_AMBULATORY_CARE_PROVIDER_SITE_OTHER): Payer: Medicare HMO | Admitting: Family Medicine

## 2016-04-05 VITALS — BP 110/78 | HR 69 | Temp 97.6°F | Ht 60.0 in | Wt 155.0 lb

## 2016-04-05 DIAGNOSIS — G4700 Insomnia, unspecified: Secondary | ICD-10-CM

## 2016-04-05 DIAGNOSIS — Z0000 Encounter for general adult medical examination without abnormal findings: Secondary | ICD-10-CM

## 2016-04-05 DIAGNOSIS — Z66 Do not resuscitate: Secondary | ICD-10-CM

## 2016-04-05 DIAGNOSIS — E785 Hyperlipidemia, unspecified: Secondary | ICD-10-CM | POA: Diagnosis not present

## 2016-04-05 DIAGNOSIS — C50211 Malignant neoplasm of upper-inner quadrant of right female breast: Secondary | ICD-10-CM

## 2016-04-05 DIAGNOSIS — Z01419 Encounter for gynecological examination (general) (routine) without abnormal findings: Secondary | ICD-10-CM

## 2016-04-05 NOTE — Assessment & Plan Note (Signed)
Reviewed preventive care protocols, scheduled due services, and updated immunizations Discussed nutrition, exercise, diet, and healthy lifestyle.  

## 2016-04-05 NOTE — Assessment & Plan Note (Signed)
Followed by oncology. On tamoxifen. Mammogram scheduled.

## 2016-04-05 NOTE — Assessment & Plan Note (Signed)
The patients weight, height, BMI and visual acuity have been recorded in the chart.  Cognitive function assessed.   I have made referrals, counseling and provided education to the patient based review of the above and I have provided the pt with a written personalized care plan for preventive services.  

## 2016-04-05 NOTE — Assessment & Plan Note (Signed)
Well controlled on zetia. NO changes made.

## 2016-04-05 NOTE — Progress Notes (Addendum)
Subjective:    Patient ID: Kayla Mcgrath, female    DOB: [birth year], 71 y.o.   MRN: [id number]  HPI  71 yo pleasant female here for annual medicare wellness visit and follow up of chronic medical conditions.  I have personally reviewed the Medicare Annual Wellness questionnaire and have noted 1. The patient's medical and social history 2. Their use of alcohol, tobacco or illicit drugs 3. Their current medications and supplements 4. The patient's functional ability including ADL's, fall risks, home safety risks and hearing or visual             impairment. 5. Diet and physical activities 6. Evidence for depression or mood disorders  End of life wishes discussed and updated in Social History. She is a DNR  The roster of all physicians providing medical care to patient - is listed in the Care[facility] section of the chart.  Mammogram neg 05/11/2015- next one scheduled for 05/11/16 Pneumovax 05/07/12 Prevnar 13 05/30/13 Zostavax 05/07/12 Colonoscopy 8/19/15Olevia Perches, 5 year recall Td 05/30/13   Breast CA- doing well. S/p mastectomy after recurrence two years ago.  Last saw oncology NP, Mike Craze on 10/29/15-  Note reviewed.  Now on tamoxifen due to vaginal dryness.   HLD-  stable with Zetia.  Lab Results  Component Value Date   CHOL 192 03/25/2016   HDL 67.00 03/25/2016   LDLCALC 109 (H) 03/25/2016   LDLDIRECT 161.9 04/30/2012   TRIG 80.0 03/25/2016   CHOLHDL 3 03/25/2016   Lab Results  Component Value Date   NA 136 03/25/2016   K 4.2 03/25/2016   CL 103 03/25/2016   CO2 28 03/25/2016   Lab Results  Component Value Date   CREATININE 0.72 03/25/2016   Wt Readings from Last 3 Encounters:  04/05/16 155 lb (70 kg)  12/31/15 156 lb (70.8 kg)  10/29/15 161 lb 6.4 oz (73.2 kg)    Patient Active Problem List   Diagnosis Date Noted  . Medicare annual wellness visit, subsequent 04/05/2016  . Well woman exam 03/21/2016  . Arthralgia 06/29/2015  . Osteopenia determined by  x-ray 01/27/2014  . Insomnia 05/30/2013  . HLD (hyperlipidemia) 05/07/2012  . DNR (do not resuscitate) 05/07/2012  . Primary cancer of upper inner quadrant of right female breast ([place]) 04/27/2012   Past Medical History:  Diagnosis Date  . Breast cancer ([place])   . Colon polyps    ?  Marland Kitchen DCIS (ductal carcinoma in situ) of breast 2010  . Diverticulosis   . Full dentures   . Hyperlipidemia   . Joint pain   . Skin cancer   . Wears glasses    Past Surgical History:  Procedure Laterality Date  . BREAST LUMPECTOMY Right december 2010   cancer-tamoxifen  . BREAST RECONSTRUCTION WITH PLACEMENT OF TISSUE EXPANDER AND FLEX HD (ACELLULAR HYDRATED DERMIS) Right 06/20/2012   Procedure: BREAST RECONSTRUCTION WITH PLACEMENT OF TISSUE EXPANDER AND FLEX HD (ACELLULAR HYDRATED DERMIS);  Surgeon: Theodoro Kos, DO;  Location: [facility];  Service: Plastics;  Laterality: Right;  . BREAST REDUCTION WITH MASTOPEXY Left 10/11/2012   Procedure: LEFT BREAST REDUCTION WITH MASTOPEXY;  Surgeon: Theodoro Kos, DO;  Location: [facility];  Service: Plastics;  Laterality: Left;  . COLONOSCOPY    . DILATION AND CURETTAGE OF UTERUS    . LIPOSUCTION WITH LIPOFILLING Left 10/11/2012   Procedure: LIPOSUCTION WITH LIPOFILLING;  Surgeon: Theodoro Kos, DO;  Location: [facility];  Service: Plastics;  Laterality: Left;  .  MASTECTOMY W/ SENTINEL NODE BIOPSY Right 06/20/2012   Procedure: right skin sparing MASTECTOMY WITH SENTINEL LYMPH NODE BIOPSY;  Surgeon: Stark Klein, MD;  Location: [facility];  Service: General;  Laterality: Right;  . REMOVAL OF TISSUE EXPANDER AND PLACEMENT OF IMPLANT Right 10/11/2012   Procedure: REMOVAL OF TISSUE EXPANDER AND PLACEMENT OF SILICONE IMPLANT RIGHT;  Surgeon: Theodoro Kos, DO;  Location: [facility];  Service: Plastics;  Laterality: Right;   Social History  Substance Use Topics  . Smoking status: Former Smoker     Types: Cigarettes    Quit date: 06/14/1981  . Smokeless tobacco: Never Used  . Alcohol use No   Family History  Problem Relation Age of Onset  . Alcohol abuse Mother   . Colon polyps Mother   . Mental illness Mother   . Alcohol abuse Father     suicide  . Mental illness Father   . Heart disease Paternal Grandfather    Allergies  Allergen Reactions  . Latex Rash   Current Outpatient Prescriptions on File Prior to Visit  Medication Sig Dispense Refill  . Cholecalciferol (VITAMIN D) 2000 UNITS CAPS Take one by mouth daily    . ezetimibe (ZETIA) 10 MG tablet TAKE 1 TABLET (10 MG TOTAL) BY MOUTH DAILY. 90 tablet 0  . ketoconazole (NIZORAL) 2 % cream APPLY BY TOPICAL ROUTE EVERY DAY TO THE AFFECTED AREA(S)  2  . tamoxifen (NOLVADEX) 20 MG tablet TAKE 1 TABLET EVERY DAY 90 tablet 4  . traMADol (ULTRAM) 50 MG tablet Take 1 tablet (50 mg total) by mouth every 6 (six) hours as needed. 50 tablet 2   No current facility-administered medications on file prior to visit.    The PMH, PSH, Social History, Family History, Medications, and allergies have been reviewed in [facility], and have been updated if relevant.   Review of Systems  Constitutional: Negative.   HENT: Negative.   Respiratory: Negative.   Cardiovascular: Negative.   Gastrointestinal: Negative.   Endocrine: Negative.   Genitourinary: Negative.   Musculoskeletal: Negative.   Skin: Negative.   Allergic/Immunologic: Negative.   Neurological: Negative.   Hematological: Negative.   Psychiatric/Behavioral: Negative.   All other systems reviewed and are negative.  See HPI      Objective:   Physical Exam  Constitutional: She is oriented to person, place, and time. She appears well-developed and well-nourished. No distress.  HENT:  Head: Normocephalic.  Eyes: Conjunctivae are normal.  Neck: Normal range of motion. Neck supple. No thyromegaly present.  Cardiovascular: Normal rate and regular rhythm.   Pulmonary/Chest: Effort  normal and breath sounds normal. No respiratory distress. She has no wheezes.  Abdominal: Soft.  Musculoskeletal: She exhibits no edema.  Neurological: She is alert and oriented to person, place, and time. No cranial nerve deficit.  Skin: Skin is warm and dry.  Psychiatric: She has a normal mood and affect. Her behavior is normal. Judgment and thought content normal.  Nursing note and vitals reviewed.  BP 110/78   Pulse 69   Temp 97.6 F (36.4 C)   Ht 5' (1.524 m)   Wt 155 lb (70.3 kg)   SpO2 98%   BMI 30.27 kg/m  Wt Readings from Last 3 Encounters:  04/05/16 155 lb (70 kg)  12/31/15 156 lb (70.8 kg)  10/29/15 161 lb 6.4 oz (73.2 kg)           Assessment & Plan:

## 2016-05-11 ENCOUNTER — Ambulatory Visit
Admission: RE | Admit: 2016-05-11 | Discharge: 2016-05-11 | Disposition: A | Discharge status: Home / Self Care | Payer: Medicare HMO | Source: Ambulatory Visit | Attending: Adult Health | Admitting: Adult Health

## 2016-05-11 ENCOUNTER — Telehealth: Payer: Self-pay

## 2016-05-11 DIAGNOSIS — Z780 Asymptomatic menopausal state: Secondary | ICD-10-CM | POA: Diagnosis not present

## 2016-05-11 DIAGNOSIS — Z1231 Encounter for screening mammogram for malignant neoplasm of breast: Secondary | ICD-10-CM | POA: Diagnosis not present

## 2016-05-11 DIAGNOSIS — M8580 Other specified disorders of bone density and structure, unspecified site: Secondary | ICD-10-CM

## 2016-05-11 DIAGNOSIS — M85852 Other specified disorders of bone density and structure, left thigh: Secondary | ICD-10-CM | POA: Diagnosis not present

## 2016-05-11 NOTE — Telephone Encounter (Signed)
-----   Message from Gardenia Phlegm, NP sent at 05/11/2016  9:32 AM EDT ----- Please let patient know that her bone density is consistent with osteopenia.  However, when comparing to her last bone density, her score is improved from when her bone density was done in 2014.  She went from -2.2 to -1.9.  Recommend continuing calcium, vitamin d and weight bearing exercises.

## 2016-05-11 NOTE — Telephone Encounter (Signed)
LVM for pt to call this RN to review results of bone density

## 2016-05-16 ENCOUNTER — Other Ambulatory Visit: Payer: Self-pay | Admitting: Family Medicine

## 2016-10-05 DIAGNOSIS — Z0100 Encounter for examination of eyes and vision without abnormal findings: Secondary | ICD-10-CM | POA: Diagnosis not present

## 2016-10-12 ENCOUNTER — Other Ambulatory Visit: Payer: Self-pay | Admitting: Oncology

## 2016-10-27 ENCOUNTER — Telehealth: Payer: Self-pay

## 2016-10-27 NOTE — Telephone Encounter (Signed)
PLEASE NOTE: All timestamps contained within this report are represented as [place] Standard Time. CONFIDENTIALTY NOTICE: This fax transmission is intended only for the addressee. It contains information that is legally privileged, confidential or otherwise protected from use or disclosure. If you are not the intended recipient, you are strictly prohibited from reviewing, disclosing, copying using or disseminating any of this information or taking any action in reliance on or regarding this information. If you have received this fax in error, please notify us immediately by telephone so that we can arrange for its return to [place]. Phone: [phone], Toll-Free: [phone], Fax: [phone] Page: 1 of 2 Call Id: [id number] [place] Patient Name: Kayla Mcgrath Gender: Female DOB: April 05, 1945 Age: 71 Y 10 M 12 D Return Phone Number: [phone] (Primary) Address: City/State/Zip: [place] [address] Client [facility] - Client Client Site [facility] - Day Physician Arnette Norris - MD Contact Type Call Who Is Calling Patient / Member / Family / Caregiver Call Type Triage / Clinical Relationship To Patient Self Return Phone Number [phone] (Primary) Chief Complaint Leg Pain Reason for Call Symptomatic / Request for Health Information Initial Comment caller states she just developed a sudden pain in the back of her leg, sharp pain. Hurts when she walks, stops when she sits down temporarily Appointment Disposition EMR Appointment Not Necessary Translation No Nurse Assessment Nurse: Joline Salt, RN, Malachy Mood Date/Time Eilene Ghazi Time): 10/27/2016 1:17:43 PM Confirm and document reason for call. If symptomatic, describe symptoms. ---[name] states she just developed a sudden sharp pain in the back of her right leg. Pain runs from thigh down. The pain is from  the knee up. Does the patient have any new or worsening symptoms? ---Yes Will a triage be completed? ---Yes Related visit to physician within the last 2 weeks? ---N/A Does the PT have any chronic conditions? (i.e. diabetes, asthma, etc.) ---No Is this a behavioral health or substance abuse call? ---No Guidelines Guideline Title Affirmed Question Affirmed Notes Nurse Date/Time Eilene Ghazi Time) Leg Pain Unable to walk Joline Salt, Therapist, sports, Malachy Mood 10/27/2016 1:20:45 PM Disp. Time Eilene Ghazi Time) Disposition Final User 10/27/2016 1:28:58 PM Send To RN Personal Joline Salt, RN, Malachy Mood 10/27/2016 1:25:53 PM Go to ED Now Yes Joline Salt, RN, Erskine Speed Disagree/Comply Comply [name] Understands Yes PLEASE NOTE: All timestamps contained within this report are represented as [place] Standard Time. CONFIDENTIALTY NOTICE: This fax transmission is intended only for the addressee. It contains information that is legally privileged, confidential or otherwise protected from use or disclosure. If you are not the intended recipient, you are strictly prohibited from reviewing, disclosing, copying using or disseminating any of this information or taking any action in reliance on or regarding this information. If you have received this fax in error, please notify us immediately by telephone so that we can arrange for its return to [place]. Phone: [phone], Toll-Free: [phone], Fax: [phone] Page: 2 of 2 Call Id: [id number] PreDisposition Go to ED Care Advice Given Per Guideline GO TO ED NOW: You need to be seen in the Emergency Department. Go to the ER at ___________ [place] now. Drive carefully. CARE ADVICE given per Leg Pain (Adult) guideline. BRING MEDICINES: Referrals [facility] - ED

## 2016-10-27 NOTE — Telephone Encounter (Signed)
Pt daughter is RN at [facility] ED and pt went to see pts daughter and she got a knee brace. Pt taking tylenol and elevating leg and pt feeling better. Pt may cb for appt with Dr Lorelei Pont next week. FYI to Dr Deborra Medina. [facility] did not send to my desktop and it did not come over fax; I found when I cked portal.

## 2016-10-31 NOTE — Progress Notes (Signed)
ID: [place]la Mcgrath   DOB: 1945-01-27  MR#: [id number]  VOH#:[id number]  PCP: Kayla Passy, MD GYN:  SU: [place] [facility] OTHER MD: [place] Mcgrath; [place] [phone]), [place] Mc[place], [place] Mc[place]ath  CHIEF COMPLAINT:  Right Breast Cancer  CURRENT TREATMENT: tamoxifen   HISTORY OF PRESENT ILLNESS: From Kayla original intake note:  [place]la Mc[place] underwent right lumpectomy in December of 2010 for a 1.2 cm ductal carcinoma in situ, intermediate grade, with close but negative margins (1 mm). I do not have Kayla prognostic panel, but per patient and history [place]la tumor was estrogen receptor positive and [place]la Mc[place] took tamoxifen for approximately one year. She declined radiation.  She was then followed closely, with mammography every 6 months. On 03/08/2012 a new area of amorphous calcifications measuring 2 mm were noted at Kayla 9:00 position of Kayla right breast. There was also an isodense oval mass measuring 7 mm at Kayla 2:00 position in Kayla same breast. Ultrasound of Kayla right breast confirmed a 6 mm oval hypoechoic mass with microlobulated margins.  [place] patient was [facility] for biopsy, and on for 15 2014 underwent biopsy of Kayla small area of calcifications in Kayla right breast as well as [place] breast mass in [place] same breast. These showed ([id number]) invasive ductal carcinoma, grade 2 or 3, estrogen receptor 100% positive, progesterone receptor 10% positive, with an MIB-1 of 35%, and no HER-2 amplification. Kayla small area of calcification showed only atypical ductal hyperplasia.  [place] patient's subsequent history is as detailed below.  INTERVAL HISTORY: [place] Mc[place] returns today for follow-up and treatment of her estrogen receptor positive breast cancer.. She continues on tamoxifen, with good tolerance. She has no hot flashes or increase in vaginal wetness.   She has had her most recent left breast screening mammography with tomography completed at [facility] on 05/11/2016 with results  showing: Breast density C. No mammographic evidence of malignancy.   Subsequently, she has had a bone density scan completed at [facility] on 05/11/2016 with results showing: T-score of -1.9 at left femur neck.   She has had a right knee xray completed on 07/06/2015 with results showing osteoarthritic joint space. No acute bony abnormality.    REVIEW OF SYSTEMS: [place] Mcgr[place] reports right knee pain. She has an appointment with Dr. Edilia Mcgrath, Sports Medicine Specialist at [facility] tomorrow morning. She "overdid" it while hiking over dunes on her trip to [place], [place]. She used to be able to walk daily along with She denies unusual headaches, visual changes, nausea, vomiting, or dizziness. There has been no unusual cough, phlegm production, or pleurisy. This been no change in bowel or bladder habits. She denies unexplained fatigue or unexplained weight loss, bleeding, rash, or fever. A detailed review of systems was otherwise stable.    PAST MEDICAL HISTORY: Past Medical History:  Diagnosis Date  . Breast cancer ([place] [facility])   . Colon polyps    ?  Marland Kitchen DCIS (ductal carcinoma in situ) of breast 2010  . Diverticulosis   . Full dentures   . Hyperlipidemia   . Joint pain   . Skin cancer   . Wears glasses     PAST SURGICAL HISTORY: Past Surgical History:  Procedure Laterality Date  . BREAST BIOPSY Right 05/11/2012  . BREAST BIOPSY Right 04/24/2012  . BREAST LUMPECTOMY Right december 2010   cancer-tamoxifen  . BREAST RECONSTRUCTION WITH PLACEMENT OF TISSUE EXPANDER AND FLEX HD (ACELLULAR HYDRATED DERMIS) Right 06/20/2012   Procedure: BREAST RECONSTRUCTION WITH PLACEMENT OF  TISSUE EXPANDER AND FLEX HD (ACELLULAR HYDRATED DERMIS);  Surgeon: [place] Kos, DO;  Location: [facility] [place];  Service: Plastics;  Laterality: Right;  . BREAST REDUCTION WITH MASTOPEXY Left 10/11/2012   Procedure: LEFT BREAST REDUCTION WITH MASTOPEXY;  Surgeon: [place] Kos, DO;  Location: [facility];   Service: Plastics;  Laterality: Left;  . COLONOSCOPY    . DILATION AND CURETTAGE OF UTERUS    . LIPOSUCTION WITH LIPOFILLING Left 10/11/2012   Procedure: LIPOSUCTION WITH LIPOFILLING;  Surgeon: [place] Kos, DO;  Location: [facility];  Service: Plastics;  Laterality: Left;  Marland Kitchen MASTECTOMY Right 06/20/2012  . MASTECTOMY W/ SENTINEL NODE BIOPSY Right 06/20/2012   Procedure: right skin sparing MASTECTOMY WITH SENTINEL LYMPH NODE BIOPSY;  Surgeon: [place] Klein, MD;  Location: [facility];  Service: General;  Laterality: Right;  . REDUCTION MAMMAPLASTY Left 10/2012  . REMOVAL OF TISSUE EXPANDER AND PLACEMENT OF IMPLANT Right 10/11/2012   Procedure: REMOVAL OF TISSUE EXPANDER AND PLACEMENT OF SILICONE IMPLANT RIGHT;  Surgeon: [place] Kos, DO;  Location: [place];  Service: Plastics;  Laterality: Right;    FAMILY HISTORY Family History  Problem Relation Age of Onset  . Alcohol abuse Mother   . Colon polyps Mother   . Mental illness Mother   . Alcohol abuse Father        suicide  . Mental illness Father   . Heart disease Paternal Grandfather    Kayla patient's father committed suicide at Kayla age of 26. Kayla patient's mother died at Kayla age of 72 in [place]la setting of alcohol abuse. Kayla patient has one brother, 2 sisters. There is no history of breast or ovarian cancer in [place] family to Kayla patient's knowledge.  GYNECOLOGIC HISTORY: Menarche age 38, first live birth age 73, [place] patient is [place] P2. She went through menopause approximately 1998. She did not take hormone replacement.  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 Her husband Kayla Mcgrath, currently retired, used to Sports administrator. Daughter Kayla Mcgrath lives in [place] and works as an Therapist, sports for [employer] and also at [employer] in [place]. Daughter Kayla Mcgrath is a homemaker in [place]. Kayla patient has 7 grandchildren.. She is a Tourist information centre manager.    ADVANCED  DIRECTIVES: Not in place  HEALTH MAINTENANCE: (Updated 11/01/2012) Social History  Substance Use Topics  . Smoking status: Former Smoker    Types: Cigarettes    Quit date: 06/14/1981  . Smokeless tobacco: Never Used  . Alcohol use No     Colonoscopy: 2009  PAP: 2012  Bone density: May 2018, osteopenia  Lipid panel: April 2014, Dr. Deborra Medina   Allergies  Allergen Reactions  . Latex Rash    Current Outpatient Prescriptions  Medication Sig Dispense Refill  . Cholecalciferol (VITAMIN D) 2000 UNITS CAPS Take one by mouth daily    . ezetimibe (ZETIA) 10 MG tablet TAKE 1 TABLET (10 MG TOTAL) BY MOUTH DAILY. 90 tablet 3  . ketoconazole (NIZORAL) 2 % cream APPLY BY TOPICAL ROUTE EVERY DAY TO [place] AFFECTED AREA(S)  2  . tamoxifen (NOLVADEX) 20 MG tablet TAKE 1 TABLET EVERY DAY 90 tablet 4  . traMADol (ULTRAM) 50 MG tablet Take 1 tablet (50 mg total) by mouth every 6 (six) hours as needed. 50 tablet 2   No current facility-administered medications for this visit.     OBJECTIVE: Middle-aged white woman wearing a right knee splint  Vitals:   11/02/16 1156  BP: (!) 112/55  Pulse: 79  Resp: 18  Temp: 98.1 F (36.7 C)  SpO2: 98%     Body mass index is 32.17 kg/m.    ECOG FS: 0 Filed Weights   11/02/16 1156  Weight: 164 lb 11.2 oz (74.7 kg)   Sclerae unicteric, pupils Kayla and equal Oropharynx clear and moist No cervical or supraclavicular adenopathy Lungs no rales or rhonchi Heart regular rate and rhythm Abd soft, nontender, positive bowel sounds MSK no focal spinal tenderness, no upper extremity lymphedema Neuro: nonfocal, well oriented, appropriate affect Breasts: Kayla right breast has undergone mastectomy and reconstruction. There is some irregularity, but no evidence of disease recurrence. Kayla left breast is unremarkable. Both axillae are benign.  LAB RESULTS: Lab Results  Component Value Date   WBC 5.3 11/02/2016   NEUTROABS 3.2 11/02/2016   HGB 12.9 11/02/2016   HCT  38.4 11/02/2016   MCV 94.5 11/02/2016   PLT 173 11/02/2016      Chemistry      Component Value Date/Time   NA 136 03/25/2016 0940   NA 140 10/22/2015 0918   K 4.2 03/25/2016 0940   K 4.3 10/22/2015 0918   CL 103 03/25/2016 0940   CL 105 05/02/2012 0822   CO2 28 03/25/2016 0940   CO2 25 10/22/2015 0918   BUN 16 03/25/2016 0940   BUN 10.8 10/22/2015 0918   CREATININE 0.72 03/25/2016 0940   CREATININE 0.7 10/22/2015 0918      Component Value Date/Time   CALCIUM 10.3 03/25/2016 0940   CALCIUM 9.6 10/22/2015 0918   ALKPHOS 56 03/25/2016 0940   ALKPHOS 59 10/22/2015 0918   AST 23 03/25/2016 0940   AST 27 10/22/2015 0918   ALT 17 03/25/2016 0940   ALT 26 10/22/2015 0918   BILITOT 0.5 03/25/2016 0940   BILITOT 0.47 10/22/2015 0918       STUDIES:  She has had her most recent left breast screening mammography with tomography completed at [facility] on 05/11/2016 with results showing: Breast density C. No mammographic evidence of malignancy.   Subsequently, she has had a bone density scan completed at [facility] on 05/11/2016 with results showing: T-score of -1.9 at left femur neck.   She has had a right knee xray completed on 07/06/2015 with results showing osteoarthritic joint space. No acute bony abnormality.   ASSESSMENT: 71 y.o. [place] woman  (1) status post right lumpectomy December of 2010 for ductal carcinoma in situ, intermediate grade, estrogen receptor positive, with close but negative margins, Kayla patient refusing adjuvant radiation therapy, but taking adjuvant tamoxifen approximately one year  (2) status post right breast upper inner quadrant biopsy 04/26/2012 for a clinical T1b N0, stage IA invasive ductal carcinoma, intermediate grade, estrogen receptor 100% and progesterone receptor 10% positive, with no HER-2 amplification, and an MIB-1 of 35%  (3) status post right mastectomy and sentinel lymph node sampling 06/20/2012 for a pT1b pN0, stage IA  invasive ductal carcinoma, grade 2, again HER-2 negative.  (4) Oncotype DX score of 11 predicting a distant recurrence within 10 years of 8% if Kayla patient's only adjuvant systemic treatment is tamoxifen for 5 years.  (5) began on anastrozole in July 2014, switched to tamoxifen June 2016 because of vaginal atrophy concerns  (6)  status post reconstruction on Kayla right, and reduction mammoplasty on Kayla left on 10-14 under Kayla care of Dr. Migdalia Dk  (7) osteopenia with DEXA scan 10/02/2012 showing a T score of -2.2  PLAN:  Madailein is now more than 4 years out from definitive surgery for her breast cancer with no evidence of disease recurrence. This is very favorable.  She is tolerating Kayla tamoxifen well and Kayla plan is to continue that through July of next year, at which time she will be ready to "graduate".  She has moderate osteopenia. She continues on vitamin D supplementation and once hernia gets better she will resume her walking program. Of course tamoxifen also helps in that regard  She will see me one last time July of next year. She knows to call for any issues that may develop before that visit.  Magrinat, Virgie Dad, MD  11/02/16 12:05 PM Medical Oncology and Hematology [facility] [address] [place], [place] [address] Tel. [phone]    Fax. [phone]  This document serves as a record of services personally performed by Lurline Del, MD. It was created on her behalf by Steva Colder, a trained medical scribe. Kayla creation of this record is based on Kayla scribe's personal observations and Kayla provider's statements to them. This document has been checked and approved by Kayla attending provider.

## 2016-11-02 ENCOUNTER — Other Ambulatory Visit (HOSPITAL_BASED_OUTPATIENT_CLINIC_OR_DEPARTMENT_OTHER): Payer: Medicare HMO

## 2016-11-02 ENCOUNTER — Ambulatory Visit (HOSPITAL_BASED_OUTPATIENT_CLINIC_OR_DEPARTMENT_OTHER): Payer: Medicare HMO | Admitting: Oncology

## 2016-11-02 VITALS — BP 112/55 | HR 79 | Temp 98.1°F | Resp 18 | Ht 60.0 in | Wt 164.7 lb

## 2016-11-02 DIAGNOSIS — M8588 Other specified disorders of bone density and structure, other site: Secondary | ICD-10-CM

## 2016-11-02 DIAGNOSIS — C50211 Malignant neoplasm of upper-inner quadrant of right female breast: Secondary | ICD-10-CM

## 2016-11-02 DIAGNOSIS — Z853 Personal history of malignant neoplasm of breast: Secondary | ICD-10-CM | POA: Diagnosis not present

## 2016-11-02 LAB — CBC WITH DIFFERENTIAL/PLATELET
BASO%: 0.9 % (ref 0.0–2.0)
Basophils Absolute: 0 10*3/uL (ref 0.0–0.1)
EOS ABS: 0.1 10*3/uL (ref 0.0–0.5)
EOS%: 2.4 % (ref 0.0–7.0)
HEMATOCRIT: 38.4 % (ref 34.8–46.6)
HGB: 12.9 g/dL (ref 11.6–15.9)
LYMPH#: 1.5 10*3/uL (ref 0.9–3.3)
LYMPH%: 28.6 % (ref 14.0–49.7)
MCH: 31.7 pg (ref 25.1–34.0)
MCHC: 33.5 g/dL (ref 31.5–36.0)
MCV: 94.5 fL (ref 79.5–101.0)
MONO#: 0.5 10*3/uL (ref 0.1–0.9)
MONO%: 8.8 % (ref 0.0–14.0)
NEUT#: 3.2 10*3/uL (ref 1.5–6.5)
NEUT%: 59.3 % (ref 38.4–76.8)
PLATELETS: 173 10*3/uL (ref 145–400)
RBC: 4.07 10*6/uL (ref 3.70–5.45)
RDW: 12.5 % (ref 11.2–14.5)
WBC: 5.3 10*3/uL (ref 3.9–10.3)

## 2016-11-02 LAB — COMPREHENSIVE METABOLIC PANEL
ALT: 17 U/L (ref 0–55)
ANION GAP: 9 meq/L (ref 3–11)
AST: 23 U/L (ref 5–34)
Albumin: 3.6 g/dL (ref 3.5–5.0)
Alkaline Phosphatase: 59 U/L (ref 40–150)
BILIRUBIN TOTAL: 0.34 mg/dL (ref 0.20–1.20)
BUN: 14.7 mg/dL (ref 7.0–26.0)
CALCIUM: 9.7 mg/dL (ref 8.4–10.4)
CHLORIDE: 105 meq/L (ref 98–109)
CO2: 26 meq/L (ref 22–29)
CREATININE: 0.8 mg/dL (ref 0.6–1.1)
Glucose: 87 mg/dl (ref 70–140)
Potassium: 3.8 mEq/L (ref 3.5–5.1)
Sodium: 140 mEq/L (ref 136–145)
TOTAL PROTEIN: 6.8 g/dL (ref 6.4–8.3)

## 2016-11-03 ENCOUNTER — Encounter: Payer: Self-pay | Admitting: Family Medicine

## 2016-11-03 ENCOUNTER — Ambulatory Visit (INDEPENDENT_AMBULATORY_CARE_PROVIDER_SITE_OTHER): Payer: Medicare HMO | Admitting: Family Medicine

## 2016-11-03 VITALS — BP 120/60 | HR 80 | Temp 98.7°F | Ht 60.0 in | Wt 164.2 lb

## 2016-11-03 DIAGNOSIS — G8929 Other chronic pain: Secondary | ICD-10-CM | POA: Diagnosis not present

## 2016-11-03 DIAGNOSIS — M1711 Unilateral primary osteoarthritis, right knee: Secondary | ICD-10-CM | POA: Diagnosis not present

## 2016-11-03 DIAGNOSIS — M25561 Pain in right knee: Secondary | ICD-10-CM | POA: Diagnosis not present

## 2016-11-03 MED ORDER — METHYLPREDNISOLONE ACETATE 40 MG/ML IJ SUSP
80.0000 mg | Freq: Once | INTRAMUSCULAR | Status: AC
  Administered 2016-11-03: 80 mg via INTRA_ARTICULAR

## 2016-11-03 MED ORDER — TRAMADOL HCL 50 MG PO TABS: 50.0000 mg | ORAL_TABLET | Freq: Four times a day (QID) | ORAL | 2 refills | Status: DC | PRN

## 2016-11-03 NOTE — Progress Notes (Signed)
Dr. Frederico Hamman T. Dalvin Clipper, MD, [facility] [place], [address] Phone: [phone] Fax: [phone]  11/03/2016  Patient: [place]la [place], MRN: [id number], DOB: Nov 21, 1945, 71 y.o.  Primary Physician:  [name], MD   Chief Complaint  Patient presents with  . Knee Pain    Right   Subjective:   Kayla Mcgrath is a 71 y.o. very pleasant female patient who presents with the following:  Went to [place] for a few days, then went back in the back and was looking at the wild horses. Flared up a lot since.   I remember her well from prior visit about 18 months ago, and at that point found she did have some moderate to severe osteoarthritis of her right knee.  This is the similar knee that is affected today. I injected her knee with corticosteroid about 10 months ago, and she had a good relief of symptoms after this.  She primarily thinks that this Flared up after walking on the sand and going up and down the dunes looking up and around at the wild horses.  Past Medical History, Surgical History, Social History, Family History, Problem List, Medications, and Allergies have been reviewed and updated if relevant.  Patient Active Problem List   Diagnosis Date Noted  . Medicare annual wellness visit, subsequent 04/05/2016  . Well woman exam 03/21/2016  . Arthralgia 06/29/2015  . Osteopenia determined by x-ray 01/27/2014  . Insomnia 05/30/2013  . HLD (hyperlipidemia) 05/07/2012  . DNR (do not resuscitate) 05/07/2012  . Primary cancer of upper inner quadrant of right female breast ([place]) 04/27/2012    Past Medical History:  Diagnosis Date  . Breast cancer ([place])   . Colon polyps    ?  Marland Kitchen DCIS (ductal carcinoma in situ) of breast 2010  . Diverticulosis   . Full dentures   . Hyperlipidemia   . Joint pain   . Skin cancer   . Wears glasses     Past Surgical History:  Procedure Laterality Date  . BREAST BIOPSY Right  05/11/2012  . BREAST BIOPSY Right 04/24/2012  . BREAST LUMPECTOMY Right december 2010   cancer-tamoxifen  . BREAST RECONSTRUCTION WITH PLACEMENT OF TISSUE EXPANDER AND FLEX HD (ACELLULAR HYDRATED DERMIS) Right 06/20/2012   Procedure: BREAST RECONSTRUCTION WITH PLACEMENT OF TISSUE EXPANDER AND FLEX HD (ACELLULAR HYDRATED DERMIS);  Surgeon: Theodoro Kos, DO;  Location: [facility];  Service: Plastics;  Laterality: Right;  . BREAST REDUCTION WITH MASTOPEXY Left 10/11/2012   Procedure: LEFT BREAST REDUCTION WITH MASTOPEXY;  Surgeon: Theodoro Kos, DO;  Location: [facility];  Service: Plastics;  Laterality: Left;  . COLONOSCOPY    . DILATION AND CURETTAGE OF UTERUS    . LIPOSUCTION WITH LIPOFILLING Left 10/11/2012   Procedure: LIPOSUCTION WITH LIPOFILLING;  Surgeon: Theodoro Kos, DO;  Location: [facility];  Service: Plastics;  Laterality: Left;  Marland Kitchen MASTECTOMY Right 06/20/2012  . MASTECTOMY W/ SENTINEL NODE BIOPSY Right 06/20/2012   Procedure: right skin sparing MASTECTOMY WITH SENTINEL LYMPH NODE BIOPSY;  Surgeon: Stark Klein, MD;  Location: [facility];  Service: General;  Laterality: Right;  . REDUCTION MAMMAPLASTY Left 10/2012  . REMOVAL OF TISSUE EXPANDER AND PLACEMENT OF IMPLANT Right 10/11/2012   Procedure: REMOVAL OF TISSUE EXPANDER AND PLACEMENT OF SILICONE IMPLANT RIGHT;  Surgeon: Theodoro Kos, DO;  Location: [facility];  Service: Plastics;  Laterality: Right;  Social History   Social History  . Marital status: Married    Spouse name: N/A  . Number of children: 2  . Years of education: N/A   Occupational History  . retired Corporate treasurer    Social History Main Topics  . Smoking status: Former Smoker    Types: Cigarettes    Quit date: 06/14/1981  . Smokeless tobacco: Never Used  . Alcohol use No  . Drug use: No  . Sexual activity: Yes    Birth control/ protection: Post-menopausal   Other Topics Concern  . Not on  file   Social History Narrative   Recently moved from [place] to be closer to her two daughters and grandchildren.   Retired Corporate treasurer.   DNR- forms singed on 05/07/2012 and returned to pt.    Family History  Problem Relation Age of Onset  . Alcohol abuse Mother   . Colon polyps Mother   . Mental illness Mother   . Alcohol abuse Father        suicide  . Mental illness Father   . Heart disease Paternal Grandfather     Allergies  Allergen Reactions  . Latex Rash    Medication list reviewed and updated in full in [place].  GEN: No fevers, chills. Nontoxic. Primarily MSK c/o today. MSK: Detailed in the HPI GI: tolerating PO intake without difficulty Neuro: No numbness, parasthesias, or tingling associated. Otherwise the pertinent positives of the ROS are noted above.   Objective:   BP 120/60   Pulse 80   Temp 98.7 F (37.1 C) (Oral)   Ht 5' (1.524 m)   Wt 164 lb 4 oz (74.5 kg)   BMI 32.08 kg/m    GEN: WDWN, NAD, Non-toxic, Alert & Oriented x 3 HEENT: Atraumatic, Normocephalic.  Ears and Nose: No external deformity. EXTR: No clubbing/cyanosis/edema NEURO: Normal gait.  PSYCH: Normally interactive. Conversant. Not depressed or anxious appearing.  Calm demeanor.    She lacks 3 of extension and flexion to 110.  This is in the right knee.  Tenderness on the medial lateral joint lines.  Minimal movement at the patella.  MCL and LCL are stable.  ACL is stable.  PCL is stable.  Flexion pinch and bounce home testing cause pain.  Radiology: No results found.  Assessment and Plan:   Primary osteoarthritis of right knee  Chronic pain of right knee - Plan: methylPREDNISolone acetate (DEPO-MEDROL) injection 80 mg   Probable arthritis flare, likely due to old walking and standing doing more walking and up and down hilly surfaces than expected in her normal day-to-day life. Continue with additional conservative measures.  Knee Injection, R Patient verbally consented to  procedure. Risks (including potential rare risk of infection), benefits, and alternatives explained. Sterilely prepped with Chloraprep. Ethyl cholride used for anesthesia. 8 cc Lidocaine 1% mixed with 2 mL Depo-Medrol 40 mg injected using the anteromedial approach without difficulty. No complications with procedure and tolerated well. Patient had decreased pain post-injection.   Follow-up: No Follow-up on file.  No future appointments.  Meds ordered this encounter  Medications  . traMADol (ULTRAM) 50 MG tablet    Sig: Take 1 tablet (50 mg total) by mouth every 6 (six) hours as needed.    Dispense:  50 tablet    Refill:  2  . methylPREDNISolone acetate (DEPO-MEDROL) injection 80 mg   Medications Discontinued During This Encounter  Medication Reason  . traMADol (ULTRAM) 50 MG tablet Reorder   No orders of the  defined types were placed in this encounter.   Signed,  Maud Deed. Audryna Wendt, MD   Allergies as of 11/03/2016      Reactions   Latex Rash      Medication List       Accurate as of 11/03/16 11:59 PM. Always use your most recent med list.          ezetimibe 10 MG tablet Commonly known as:  ZETIA TAKE 1 TABLET (10 MG TOTAL) BY MOUTH DAILY.   ketoconazole 2 % cream Commonly known as:  NIZORAL APPLY BY TOPICAL ROUTE EVERY DAY TO THE AFFECTED AREA(S)   tamoxifen 20 MG tablet Commonly known as:  NOLVADEX TAKE 1 TABLET EVERY DAY   traMADol 50 MG tablet Commonly known as:  ULTRAM Take 1 tablet (50 mg total) by mouth every 6 (six) hours as needed.   Vitamin D 2000 units Caps Take one by mouth daily

## 2016-11-07 ENCOUNTER — Telehealth: Payer: Self-pay | Admitting: Oncology

## 2016-11-07 NOTE — Telephone Encounter (Signed)
Scheduled appt per 10/24 los - sent reminder letter in the mail - appt for lab and f/u mid July 2019

## 2017-03-28 ENCOUNTER — Other Ambulatory Visit: Payer: Self-pay | Admitting: Oncology

## 2017-03-28 DIAGNOSIS — Z853 Personal history of malignant neoplasm of breast: Secondary | ICD-10-CM

## 2017-05-12 ENCOUNTER — Other Ambulatory Visit: Payer: Self-pay | Admitting: Family Medicine

## 2017-05-15 ENCOUNTER — Ambulatory Visit
Admission: RE | Admit: 2017-05-15 | Discharge: 2017-05-15 | Disposition: A | Discharge status: Home / Self Care | Payer: Medicare HMO | Source: Ambulatory Visit | Attending: Oncology | Admitting: Oncology

## 2017-05-15 DIAGNOSIS — Z853 Personal history of malignant neoplasm of breast: Secondary | ICD-10-CM

## 2017-05-15 DIAGNOSIS — Z1231 Encounter for screening mammogram for malignant neoplasm of breast: Secondary | ICD-10-CM | POA: Diagnosis not present

## 2017-06-14 NOTE — Progress Notes (Signed)
Subjective:   Kayla Mcgrath is a 72 y.o. female who presents for Medicare Annual (Subsequent) preventive examination.  Review of Systems: No ROS.  Medicare Wellness Visit. Additional risk factors are reflected in the social history. Cardiac Risk Factors include: advanced age (>62men, >39 women);dyslipidemia Sleep patterns: Sleeps 7-8 hrs. Feels rested Home Safety/Smoke Alarms: Feels safe in home. Smoke alarms in place.  Living environment; residence and Firearm Safety: 1 story home. Lives with husband and pets.   Female:        Mammo-utd       Dexa scan-utd        CCS-due 09/11/2018     Objective:     Vitals: BP 116/62 (BP Location: Left Arm, Patient Position: Sitting, Cuff Size: Normal)   Pulse 85   Ht 5' (1.524 m)   Wt 164 lb 3.2 oz (74.5 kg)   SpO2 96%   BMI 32.07 kg/m   Body mass index is 32.07 kg/m.  Advanced Directives 06/21/2017 04/05/2016 10/29/2015 10/23/2014 07/01/2014 07/01/2014 01/27/2014  Does Patient Have a Medical Advance Directive? Yes Yes No No No No No  Type of Paramedic of [place];Living will Living will - - - - -  Copy of [place] in Chart? No - copy requested - - - - - -  Would patient like information on creating a medical advance directive? - - No - patient declined information - - No - patient declined information -  Pre-existing out of facility DNR order (yellow form or pink MOST form) - - - - - - -    Tobacco Social History   Tobacco Use  Smoking Status Former Smoker  . Types: Cigarettes  . Last attempt to quit: 06/14/1981  . Years since quitting: 36.0  Smokeless Tobacco Never Used     Counseling given: Not Answered   Clinical Intake:     Pain : No/denies pain                 Past Medical History:  Diagnosis Date  . Breast cancer ([place])   . Colon polyps    ?  Marland Kitchen DCIS (ductal carcinoma in situ) of breast 2010  . Diverticulosis   . Full dentures   . Hyperlipidemia   . Joint pain     . Skin cancer   . Wears glasses    Past Surgical History:  Procedure Laterality Date  . BREAST BIOPSY Right 05/11/2012  . BREAST BIOPSY Right 04/24/2012  . BREAST LUMPECTOMY Right december 2010   cancer-tamoxifen  . BREAST RECONSTRUCTION WITH PLACEMENT OF TISSUE EXPANDER AND FLEX HD (ACELLULAR HYDRATED DERMIS) Right 06/20/2012   Procedure: BREAST RECONSTRUCTION WITH PLACEMENT OF TISSUE EXPANDER AND FLEX HD (ACELLULAR HYDRATED DERMIS);  Surgeon: Theodoro Kos, DO;  Location: [facility];  Service: Plastics;  Laterality: Right;  . BREAST REDUCTION WITH MASTOPEXY Left 10/11/2012   Procedure: LEFT BREAST REDUCTION WITH MASTOPEXY;  Surgeon: Theodoro Kos, DO;  Location: [facility];  Service: Plastics;  Laterality: Left;  . COLONOSCOPY    . DILATION AND CURETTAGE OF UTERUS    . LIPOSUCTION WITH LIPOFILLING Left 10/11/2012   Procedure: LIPOSUCTION WITH LIPOFILLING;  Surgeon: Theodoro Kos, DO;  Location: [facility];  Service: Plastics;  Laterality: Left;  Marland Kitchen MASTECTOMY Right 06/20/2012  . MASTECTOMY W/ SENTINEL NODE BIOPSY Right 06/20/2012   Procedure: right skin sparing MASTECTOMY WITH SENTINEL LYMPH NODE BIOPSY;  Surgeon: Stark Klein, MD;  Location: [facility];  Service: General;  Laterality: Right;  . REDUCTION MAMMAPLASTY Left 10/2012  . REMOVAL OF TISSUE EXPANDER AND PLACEMENT OF IMPLANT Right 10/11/2012   Procedure: REMOVAL OF TISSUE EXPANDER AND PLACEMENT OF SILICONE IMPLANT RIGHT;  Surgeon: Theodoro Kos, DO;  Location: [facility];  Service: Plastics;  Laterality: Right;   Family History  Problem Relation Age of Onset  . Alcohol abuse Mother   . Colon polyps Mother   . Mental illness Mother   . Alcohol abuse Father        suicide  . Mental illness Father   . Heart disease Paternal Grandfather    Social History   Socioeconomic History  . Marital status: Married    Spouse name: Not on file  . Number of  children: 2  . Years of education: Not on file  . Highest education level: Not on file  Occupational History  . Occupation: retired Corporate treasurer  Social Needs  . Financial resource strain: Not on file  . Food insecurity:    Worry: Not on file    Inability: Not on file  . Transportation needs:    Medical: Not on file    Non-medical: Not on file  Tobacco Use  . Smoking status: Former Smoker    Types: Cigarettes    Last attempt to quit: 06/14/1981    Years since quitting: 36.0  . Smokeless tobacco: Never Used  Substance and Sexual Activity  . Alcohol use: No  . Drug use: No  . Sexual activity: Yes    Birth control/protection: Post-menopausal  Lifestyle  . Physical activity:    Days per week: Not on file    Minutes per session: Not on file  . Stress: Not on file  Relationships  . Social connections:    Talks on phone: Not on file    Gets together: Not on file    Attends religious service: Not on file    Active member of club or organization: Not on file    Attends meetings of clubs or organizations: Not on file    Relationship status: Not on file  Other Topics Concern  . Not on file  Social History Narrative   Recently moved from [place] to be closer to her two daughters and grandchildren.   Retired Corporate treasurer.   DNR- forms singed on 05/07/2012 and returned to pt.    Outpatient Encounter Medications as of 06/21/2017  Medication Sig  . Cholecalciferol (VITAMIN D) 2000 UNITS CAPS Take one by mouth daily  . ezetimibe (ZETIA) 10 MG tablet TAKE 1 TABLET (10 MG TOTAL) BY MOUTH DAILY.  Marland Kitchen ketoconazole (NIZORAL) 2 % cream APPLY BY TOPICAL ROUTE EVERY DAY TO THE AFFECTED AREA(S)  . tamoxifen (NOLVADEX) 20 MG tablet TAKE 1 TABLET EVERY DAY  . traMADol (ULTRAM) 50 MG tablet Take 1 tablet (50 mg total) by mouth every 6 (six) hours as needed.   No facility-administered encounter medications on file as of 06/21/2017.     Activities of Daily Living In your present state of health, do you have any  difficulty performing the following activities: 06/21/2017  Hearing? N  Vision? N  Comment wearing glasses. [facility] yearly.   Difficulty concentrating or making decisions? N  Walking or climbing stairs? N  Dressing or bathing? N  Doing errands, shopping? N  Preparing Food and eating ? N  Using the Toilet? N  In the past six months, have you accidently leaked urine? Y  Comment wears pads  Do you have problems with loss of bowel control? N  Managing your Medications? N  Managing your Finances? N  Housekeeping or managing your Housekeeping? N  Some recent data might be hidden    Patient Care Team: [name], MD as PCP - General (Family Medicine) Magrinat, Virgie Dad, MD as Consulting Physician (Oncology) Lafayette Dragon, MD (Inactive) as Consulting Physician (Gastroenterology) Owens Loffler, MD as Consulting Physician (Family Medicine)    Assessment:   This is a routine wellness examination for Ahriana. Physical assessment deferred to PCP.  Exercise Activities and Dietary recommendations Current Exercise Habits: Home exercise routine(walks 3 miles per day), Type of exercise: walking, Intensity: Mild Diet (meal preparation, eat out, water intake, caffeinated beverages, dairy products, fruits and vegetables): well balanced, on average, 3 meals per day      Goals    . Weight (lb) < 200 lb (90.7 kg)       Fall Risk Fall Risk  06/21/2017 04/05/2016 04/05/2016 10/29/2015 06/12/2014  Falls in the past year? Yes No No No No  Number falls in past yr: 1 - - - -  Follow up Education provided;Falls prevention discussed - - - -    Depression Screen PHQ 2/9 Scores 06/21/2017 04/05/2016 04/05/2016 06/12/2014  PHQ - 2 Score 0 0 0 0     Cognitive Function Ad8 score reviewed for issues:  Issues making decisions:no  Less interest in hobbies / activities:no  Repeats questions, stories (family complaining):no  Trouble using ordinary gadgets (microwave, computer, phone):no  Forgets  the month or year: no  Mismanaging finances: no  Remembering appts:no  Daily problems with thinking and/or memory:no Ad8 score is=0     6CIT Screen 04/05/2016  What Year? 0 points  What month? 0 points  What time? 0 points  Count back from 20 0 points  Months in reverse 0 points  Repeat phrase 0 points  Total Score 0    Immunization History  Administered Date(s) Administered  . Influenza,inj,Quad PF,6+ Mos 10/02/2013, 10/10/2016  . Influenza-Unspecified 11/10/2012  . Pneumococcal Conjugate-13 05/30/2013  . Pneumococcal Polysaccharide-23 05/07/2012  . Td 05/30/2013  . Zoster 05/07/2012    Screening Tests Health Maintenance  Topic Date Due  . INFLUENZA VACCINE  08/10/2017  . COLONOSCOPY  08/29/2018  . MAMMOGRAM  05/16/2019  . TETANUS/TDAP  05/31/2023  . DEXA SCAN  Completed  . Hepatitis C Screening  Completed  . PNA vac Low Risk Adult  Completed      Plan:   Follow up with Dr.Aron as scheduled  Please schedule your next medicare wellness visit with me in 1 yr.  Continue to eat heart healthy diet (full of fruits, vegetables, whole grains, lean protein, water--limit salt, fat, and sugar intake) and increase physical activity as tolerated.  Continue doing brain stimulating activities (puzzles, reading, adult coloring books, staying active) to keep memory sharp.   Bring a copy of your living will and/or healthcare power of attorney to your next office visit.      I have personally reviewed and noted the following in the patient's chart:   . Medical and social history . Use of alcohol, tobacco or illicit drugs  . Current medications and supplements . Functional ability and status . Nutritional status . Physical activity . Advanced directives . List of other physicians . Hospitalizations, surgeries, and ER visits in previous 12 months . Vitals . Screenings to include cognitive, depression, and falls . Referrals and appointments  In addition, I have  reviewed  and discussed with patient certain preventive protocols, quality metrics, and best practice recommendations. A written personalized care plan for preventive services as well as general preventive health recommendations were provided to patient.     Shela Nevin, [place]  06/21/2017

## 2017-06-21 ENCOUNTER — Encounter: Payer: Self-pay | Admitting: Behavioral Health

## 2017-06-21 ENCOUNTER — Ambulatory Visit (INDEPENDENT_AMBULATORY_CARE_PROVIDER_SITE_OTHER): Payer: Medicare HMO | Admitting: Behavioral Health

## 2017-06-21 ENCOUNTER — Other Ambulatory Visit: Payer: Self-pay

## 2017-06-21 VITALS — BP 116/62 | HR 85 | Ht 60.0 in | Wt 164.2 lb

## 2017-06-21 DIAGNOSIS — Z0000 Encounter for general adult medical examination without abnormal findings: Secondary | ICD-10-CM | POA: Diagnosis not present

## 2017-06-21 DIAGNOSIS — E785 Hyperlipidemia, unspecified: Secondary | ICD-10-CM

## 2017-06-21 NOTE — Patient Instructions (Signed)
Follow up with Dr.Aron as scheduled  Please schedule your next medicare wellness visit with me in 1 yr.  Continue to eat heart healthy diet (full of fruits, vegetables, whole grains, lean protein, water--limit salt, fat, and sugar intake) and increase physical activity as tolerated.  Continue doing brain stimulating activities (puzzles, reading, adult coloring books, staying active) to keep memory sharp.   Bring a copy of your living will and/or healthcare power of attorney to your next office visit.   Kayla Mcgrath , Thank you for taking time to come for your Medicare Wellness Visit. I appreciate your ongoing commitment to your health goals. Please review the following plan we discussed and let me know if I can assist you in the future.   These are the goals we discussed: Goals    . Weight (lb) < 200 lb (90.7 kg)       This is a list of the screening recommended for you and due dates:  Health Maintenance  Topic Date Due  . Flu Shot  08/10/2017  . Colon Cancer Screening  08/29/2018  . Mammogram  05/16/2019  . Tetanus Vaccine  05/31/2023  . DEXA scan (bone density measurement)  Completed  .  Hepatitis C: One time screening is recommended by Center for Disease Control  (CDC) for  adults born from [birth year] through 1964.   Completed  . Pneumonia vaccines  Completed    Health Maintenance for Postmenopausal Women Menopause is a normal process in which your reproductive ability comes to an end. This process happens gradually over a span of months to years, usually between the ages of 49 and 36. Menopause is complete when you have missed 12 consecutive menstrual periods. It is important to talk with your health care provider about some of the most common conditions that affect postmenopausal women, such as heart disease, cancer, and bone loss (osteoporosis). Adopting a healthy lifestyle and getting preventive care can help to promote your health and wellness. Those actions can also lower your  chances of developing some of these common conditions. What should I know about menopause? During menopause, you may experience a number of symptoms, such as:  Moderate-to-severe hot flashes.  Night sweats.  Decrease in sex drive.  Mood swings.  Headaches.  Tiredness.  Irritability.  Memory problems.  Insomnia.  Choosing to treat or not to treat menopausal changes is an individual decision that you make with your health care provider. What should I know about hormone replacement therapy and supplements? Hormone therapy products are effective for treating symptoms that are associated with menopause, such as hot flashes and night sweats. Hormone replacement carries certain risks, especially as you become older. If you are thinking about using estrogen or estrogen with progestin treatments, discuss the benefits and risks with your health care provider. What should I know about heart disease and stroke? Heart disease, heart attack, and stroke become more likely as you age. This may be due, in part, to the hormonal changes that your body experiences during menopause. These can affect how your body processes dietary fats, triglycerides, and cholesterol. Heart attack and stroke are both medical emergencies. There are many things that you can do to help prevent heart disease and stroke:  Have your blood pressure checked at least every 1-2 years. High blood pressure causes heart disease and increases the risk of stroke.  If you are 52-71 years old, ask your health care provider if you should take aspirin to prevent a heart attack or a stroke.  Do not use any tobacco products, including cigarettes, chewing tobacco, or electronic cigarettes. If you need help quitting, ask your health care provider.  It is important to eat a healthy diet and maintain a healthy weight. ? Be sure to include plenty of vegetables, fruits, low-fat dairy products, and lean protein. ? Avoid eating foods that are  high in solid fats, added sugars, or salt (sodium).  Get regular exercise. This is one of the most important things that you can do for your health. ? Try to exercise for at least 150 minutes each week. The type of exercise that you do should increase your heart rate and make you sweat. This is known as moderate-intensity exercise. ? Try to do strengthening exercises at least twice each week. Do these in addition to the moderate-intensity exercise.  Know your numbers.Ask your health care provider to check your cholesterol and your blood glucose. Continue to have your blood tested as directed by your health care provider.  What should I know about cancer screening? There are several types of cancer. Take the following steps to reduce your risk and to catch any cancer development as early as possible. Breast Cancer  Practice breast self-awareness. ? This means understanding how your breasts normally appear and feel. ? It also means doing regular breast self-exams. Let your health care provider know about any changes, no matter how small.  If you are 82 or older, have a clinician do a breast exam (clinical breast exam or CBE) every year. Depending on your age, family history, and medical history, it may be recommended that you also have a yearly breast X-ray (mammogram).  If you have a family history of breast cancer, talk with your health care provider about genetic screening.  If you are at high risk for breast cancer, talk with your health care provider about having an MRI and a mammogram every year.  Breast cancer (BRCA) gene test is recommended for women who have family members with BRCA-related cancers. Results of the assessment will determine the need for genetic counseling and BRCA1 and for BRCA2 testing. BRCA-related cancers include these types: ? Breast. This occurs in males or females. ? Ovarian. ? Tubal. This may also be called fallopian tube cancer. ? Cancer of the abdominal or  pelvic lining (peritoneal cancer). ? Prostate. ? Pancreatic.  Cervical, Uterine, and Ovarian Cancer Your health care provider may recommend that you be screened regularly for cancer of the pelvic organs. These include your ovaries, uterus, and vagina. This screening involves a pelvic exam, which includes checking for microscopic changes to the surface of your cervix (Pap test).  For women ages 21-65, health care providers may recommend a pelvic exam and a Pap test every three years. For women ages 60-65, they may recommend the Pap test and pelvic exam, combined with testing for human papilloma virus (HPV), every five years. Some types of HPV increase your risk of cervical cancer. Testing for HPV may also be done on women of any age who have unclear Pap test results.  Other health care providers may not recommend any screening for nonpregnant women who are considered low risk for pelvic cancer and have no symptoms. Ask your health care provider if a screening pelvic exam is right for you.  If you have had past treatment for cervical cancer or a condition that could lead to cancer, you need Pap tests and screening for cancer for at least 20 years after your treatment. If Pap tests have been discontinued  for you, your risk factors (such as having a new sexual partner) need to be reassessed to determine if you should start having screenings again. Some women have medical problems that increase the chance of getting cervical cancer. In these cases, your health care provider may recommend that you have screening and Pap tests more often.  If you have a family history of uterine cancer or ovarian cancer, talk with your health care provider about genetic screening.  If you have vaginal bleeding after reaching menopause, tell your health care provider.  There are currently no reliable tests available to screen for ovarian cancer.  Lung Cancer Lung cancer screening is recommended for adults 50-39 years old  who are at high risk for lung cancer because of a history of smoking. A yearly low-dose CT scan of the lungs is recommended if you:  Currently smoke.  Have a history of at least 30 pack-years of smoking and you currently smoke or have quit within the past 15 years. A pack-year is smoking an average of one pack of cigarettes per day for one year.  Yearly screening should:  Continue until it has been 15 years since you quit.  Stop if you develop a health problem that would prevent you from having lung cancer treatment.  Colorectal Cancer  This type of cancer can be detected and can often be prevented.  Routine colorectal cancer screening usually begins at age 42 and continues through age 31.  If you have risk factors for colon cancer, your health care provider may recommend that you be screened at an earlier age.  If you have a family history of colorectal cancer, talk with your health care provider about genetic screening.  Your health care provider may also recommend using home test kits to check for hidden blood in your stool.  A small camera at the end of a tube can be used to examine your colon directly (sigmoidoscopy or colonoscopy). This is done to check for the earliest forms of colorectal cancer.  Direct examination of the colon should be repeated every 5-10 years until age 91. However, if early forms of precancerous polyps or small growths are found or if you have a family history or genetic risk for colorectal cancer, you may need to be screened more often.  Skin Cancer  Check your skin from head to toe regularly.  Monitor any moles. Be sure to tell your health care provider: ? About any new moles or changes in moles, especially if there is a change in a mole's shape or color. ? If you have a mole that is larger than the size of a pencil eraser.  If any of your family members has a history of skin cancer, especially at a young age, talk with your health care provider  about genetic screening.  Always use sunscreen. Apply sunscreen liberally and repeatedly throughout the day.  Whenever you are outside, protect yourself by wearing long sleeves, pants, a wide-brimmed hat, and sunglasses.  What should I know about osteoporosis? Osteoporosis is a condition in which bone destruction happens more quickly than new bone creation. After menopause, you may be at an increased risk for osteoporosis. To help prevent osteoporosis or the bone fractures that can happen because of osteoporosis, the following is recommended:  If you are 23-36 years old, get at least 1,000 mg of calcium and at least 600 mg of vitamin D per day.  If you are older than age 58 but younger than age 47, get  at least 1,200 mg of calcium and at least 600 mg of vitamin D per day.  If you are older than age 70, get at least 1,200 mg of calcium and at least 800 mg of vitamin D per day.  Smoking and excessive alcohol intake increase the risk of osteoporosis. Eat foods that are rich in calcium and vitamin D, and do weight-bearing exercises several times each week as directed by your health care provider. What should I know about how menopause affects my mental health? Depression may occur at any age, but it is more common as you become older. Common symptoms of depression include:  Low or sad mood.  Changes in sleep patterns.  Changes in appetite or eating patterns.  Feeling an overall lack of motivation or enjoyment of activities that you previously enjoyed.  Frequent crying spells.  Talk with your health care provider if you think that you are experiencing depression. What should I know about immunizations? It is important that you get and maintain your immunizations. These include:  Tetanus, diphtheria, and pertussis (Tdap) booster vaccine.  Influenza every year before the flu season begins.  Pneumonia vaccine.  Shingles vaccine.  Your health care provider may also recommend other  immunizations. This information is not intended to replace advice given to you by your health care provider. Make sure you discuss any questions you have with your health care provider. Document Released: 02/18/2005 Document Revised: 07/17/2015 Document Reviewed: 09/30/2014 Elsevier Interactive Patient Education  2018 [employer]  

## 2017-06-21 NOTE — Progress Notes (Signed)
I reviewed health advisor's note, was available for consultation, and agree with documentation and plan.  

## 2017-06-27 NOTE — Progress Notes (Signed)
Dr. Frederico Hamman T. Verland Sprinkle, MD, [facility] [place], [address] Phone: [phone] Fax: [phone]  06/28/2017  Patient: Kayla Mcgrath, MRN: [id number], DOB: 1945-04-26, 72 y.o.  Primary Physician:  [name], MD   Chief Complaint  Patient presents with  . Follow-up    Right Knee   Subjective:   Kayla Mcgrath is a 72 y.o. very pleasant female patient who presents with the following:  Pleasant patient who I remember well who has a history of moderate to severe osteoarthritis and presents in follow-up of this with ongoing knee pain.   Getting down on knees and lifting with her legs.  I last saw the patient in October 2018, and at that point I did do an intra-articular injection with corticosteroid.  She has done quite well since that time.  She thinks that she was doing some lifting and getting down on her knees as well as lifting which her legs which has aggravated her right knee.  She does have known moderate to severe osteoarthritis at baseline.  Past Medical History, Surgical History, Social History, Family History, Problem List, Medications, and Allergies have been reviewed and updated if relevant.  Patient Active Problem List   Diagnosis Date Noted  . Medicare annual wellness visit, subsequent 04/05/2016  . Well woman exam 03/21/2016  . Arthralgia 06/29/2015  . Osteopenia determined by x-ray 01/27/2014  . Insomnia 05/30/2013  . HLD (hyperlipidemia) 05/07/2012  . DNR (do not resuscitate) 05/07/2012  . Primary cancer of upper inner quadrant of right female breast ([place]) 04/27/2012    Past Medical History:  Diagnosis Date  . Breast cancer ([place])   . Colon polyps    ?  Marland Kitchen DCIS (ductal carcinoma in situ) of breast 2010  . Diverticulosis   . Full dentures   . Hyperlipidemia   . Joint pain   . Skin cancer   . Wears glasses     Past Surgical History:  Procedure Laterality Date  . BREAST BIOPSY Right  05/11/2012  . BREAST BIOPSY Right 04/24/2012  . BREAST LUMPECTOMY Right december 2010   cancer-tamoxifen  . BREAST RECONSTRUCTION WITH PLACEMENT OF TISSUE EXPANDER AND FLEX HD (ACELLULAR HYDRATED DERMIS) Right 06/20/2012   Procedure: BREAST RECONSTRUCTION WITH PLACEMENT OF TISSUE EXPANDER AND FLEX HD (ACELLULAR HYDRATED DERMIS);  Surgeon: Theodoro Kos, DO;  Location: [facility];  Service: Plastics;  Laterality: Right;  . BREAST REDUCTION WITH MASTOPEXY Left 10/11/2012   Procedure: LEFT BREAST REDUCTION WITH MASTOPEXY;  Surgeon: Theodoro Kos, DO;  Location: [facility];  Service: Plastics;  Laterality: Left;  . COLONOSCOPY    . DILATION AND CURETTAGE OF UTERUS    . LIPOSUCTION WITH LIPOFILLING Left 10/11/2012   Procedure: LIPOSUCTION WITH LIPOFILLING;  Surgeon: Theodoro Kos, DO;  Location: [facility];  Service: Plastics;  Laterality: Left;  Marland Kitchen MASTECTOMY Right 06/20/2012  . MASTECTOMY W/ SENTINEL NODE BIOPSY Right 06/20/2012   Procedure: right skin sparing MASTECTOMY WITH SENTINEL LYMPH NODE BIOPSY;  Surgeon: Stark Klein, MD;  Location: [facility];  Service: General;  Laterality: Right;  . REDUCTION MAMMAPLASTY Left 10/2012  . REMOVAL OF TISSUE EXPANDER AND PLACEMENT OF IMPLANT Right 10/11/2012   Procedure: REMOVAL OF TISSUE EXPANDER AND PLACEMENT OF SILICONE IMPLANT RIGHT;  Surgeon: Theodoro Kos, DO;  Location: [facility];  Service: Plastics;  Laterality: Right;    Social History   Socioeconomic History  .  Marital status: Married    Spouse name: Not on file  . Number of children: 2  . Years of education: Not on file  . Highest education level: Not on file  Occupational History  . Occupation: retired Corporate treasurer  Social Needs  . Financial resource strain: Not on file  . Food insecurity:    Worry: Not on file    Inability: Not on file  . Transportation needs:    Medical: Not on file    Non-medical: Not on file    Tobacco Use  . Smoking status: Former Smoker    Types: Cigarettes    Last attempt to quit: 06/14/1981    Years since quitting: 36.0  . Smokeless tobacco: Never Used  Substance and Sexual Activity  . Alcohol use: No  . Drug use: No  . Sexual activity: Yes    Birth control/protection: Post-menopausal  Lifestyle  . Physical activity:    Days per week: Not on file    Minutes per session: Not on file  . Stress: Not on file  Relationships  . Social connections:    Talks on phone: Not on file    Gets together: Not on file    Attends religious service: Not on file    Active member of club or organization: Not on file    Attends meetings of clubs or organizations: Not on file    Relationship status: Not on file  . Intimate partner violence:    Fear of current or ex partner: Not on file    Emotionally abused: Not on file    Physically abused: Not on file    Forced sexual activity: Not on file  Other Topics Concern  . Not on file  Social History Narrative   Recently moved from [place] to be closer to her two daughters and grandchildren.   Retired Corporate treasurer.   DNR- forms singed on 05/07/2012 and returned to pt.    Family History  Problem Relation Age of Onset  . Alcohol abuse Mother   . Colon polyps Mother   . Mental illness Mother   . Alcohol abuse Father        suicide  . Mental illness Father   . Heart disease Paternal Grandfather     Allergies  Allergen Reactions  . Latex Rash    Medication list reviewed and updated in full in [facility] [place].  GEN: No fevers, chills. Nontoxic. Primarily MSK c/o today. MSK: Detailed in the HPI GI: tolerating PO intake without difficulty Neuro: No numbness, parasthesias, or tingling associated. Otherwise the pertinent positives of the ROS are noted above.   Objective:   BP 108/70   Pulse 70   Temp 98.5 F (36.9 C) (Oral)   Ht 5' (1.524 m)   Wt 160 lb 12 oz (72.9 kg)   BMI 31.39 kg/m    GEN: WDWN, NAD, Non-toxic, Alert & Oriented x  3 HEENT: Atraumatic, Normocephalic.  Ears and Nose: No external deformity. EXTR: No clubbing/cyanosis/edema NEURO: Normal gait.  PSYCH: Normally interactive. Conversant. Not depressed or anxious appearing.  Calm demeanor.   Knee:  RGait: Normal heel toe pattern ROM: 0-117 Effusion: neg Echymosis or edema: none Patellar tendon NT Painful PLICA: neg Patellar grind: negative Medial and lateral patellar facet loading: negative medial and lateral joint lines: medial and lateral joint line pain Mcmurray's neg Flexion-pinch pain Varus and valgus stress: stable Lachman: neg Ant and Post drawer: neg Hip abduction, IR, ER: WNL Hip flexion str: 5/5 Hip abd:  5/5 Quad: 5/5 VMO atrophy:No Hamstring concentric and eccentric: 5/5   Radiology: No results found.  Assessment and Plan:   Primary osteoarthritis of right knee - Plan: methylPREDNISolone acetate (DEPO-MEDROL) injection 80 mg  Continue with conservative management for knee osteoarthritis.  She is generally doing fairly well for severity of her disease.  Knee Injection, R Patient verbally consented to procedure. Risks (including potential rare risk of infection), benefits, and alternatives explained. Sterilely prepped with Chloraprep. Ethyl cholride used for anesthesia. 8 cc Lidocaine 1% mixed with 2 mL Depo-Medrol 40 mg injected using the anteromedial approach without difficulty. No complications with procedure and tolerated well. Patient had decreased pain post-injection.   Follow-up: No follow-ups on file.  Meds ordered this encounter  Medications  . traMADol (ULTRAM) 50 MG tablet    Sig: Take 1 tablet (50 mg total) by mouth every 6 (six) hours as needed.    Dispense:  50 tablet    Refill:  2  . methylPREDNISolone acetate (DEPO-MEDROL) injection 80 mg    Signed,  Deem Marmol T. Shenae Bonanno, MD   Allergies as of 06/28/2017      Reactions   Latex Rash      Medication List        Accurate as of 06/28/17 11:59 PM. Always  use your most recent med list.          ezetimibe 10 MG tablet Commonly known as:  ZETIA TAKE 1 TABLET (10 MG TOTAL) BY MOUTH DAILY.   ketoconazole 2 % cream Commonly known as:  NIZORAL APPLY BY TOPICAL ROUTE EVERY DAY TO THE AFFECTED AREA(S)   tamoxifen 20 MG tablet Commonly known as:  NOLVADEX TAKE 1 TABLET EVERY DAY   traMADol 50 MG tablet Commonly known as:  ULTRAM Take 1 tablet (50 mg total) by mouth every 6 (six) hours as needed.   Vitamin D 2000 units Caps Take one by mouth daily

## 2017-06-28 ENCOUNTER — Encounter: Payer: Self-pay | Admitting: Family Medicine

## 2017-06-28 ENCOUNTER — Ambulatory Visit (INDEPENDENT_AMBULATORY_CARE_PROVIDER_SITE_OTHER): Payer: Medicare HMO | Admitting: Family Medicine

## 2017-06-28 VITALS — BP 108/70 | HR 70 | Temp 98.5°F | Ht 60.0 in | Wt 160.8 lb

## 2017-06-28 DIAGNOSIS — M1711 Unilateral primary osteoarthritis, right knee: Secondary | ICD-10-CM

## 2017-06-28 MED ORDER — METHYLPREDNISOLONE ACETATE 40 MG/ML IJ SUSP
80.0000 mg | Freq: Once | INTRAMUSCULAR | Status: AC
  Administered 2017-06-28: 80 mg via INTRA_ARTICULAR

## 2017-06-28 MED ORDER — TRAMADOL HCL 50 MG PO TABS: 50.0000 mg | ORAL_TABLET | Freq: Four times a day (QID) | ORAL | 2 refills | Status: DC | PRN

## 2017-06-29 ENCOUNTER — Other Ambulatory Visit (INDEPENDENT_AMBULATORY_CARE_PROVIDER_SITE_OTHER): Payer: Medicare HMO

## 2017-06-29 ENCOUNTER — Encounter: Payer: Medicare HMO | Admitting: Family Medicine

## 2017-06-29 DIAGNOSIS — E785 Hyperlipidemia, unspecified: Secondary | ICD-10-CM

## 2017-06-29 LAB — COMPREHENSIVE METABOLIC PANEL
ALBUMIN: 4.3 g/dL (ref 3.5–5.2)
ALK PHOS: 61 U/L (ref 39–117)
ALT: 19 U/L (ref 0–35)
AST: 22 U/L (ref 0–37)
BUN: 16 mg/dL (ref 6–23)
CHLORIDE: 100 meq/L (ref 96–112)
CO2: 25 mEq/L (ref 19–32)
Calcium: 10.9 mg/dL — ABNORMAL HIGH (ref 8.4–10.5)
Creatinine, Ser: 0.75 mg/dL (ref 0.40–1.20)
GFR: 81.07 mL/min (ref >60.00)
Glucose, Bld: 134 mg/dL — ABNORMAL HIGH (ref 70–99)
POTASSIUM: 4 meq/L (ref 3.5–5.1)
Sodium: 134 mEq/L — ABNORMAL LOW (ref 135–145)
Total Bilirubin: 0.6 mg/dL (ref 0.2–1.2)
Total Protein: 7.3 g/dL (ref 6.0–8.3)

## 2017-06-29 LAB — CBC WITH DIFFERENTIAL/PLATELET
BASOS PCT: 0.1 % (ref 0.0–3.0)
Basophils Absolute: 0 10*3/uL (ref 0.0–0.1)
EOS PCT: 0 % (ref 0.0–5.0)
Eosinophils Absolute: 0 10*3/uL (ref 0.0–0.7)
HCT: 41.4 % (ref 36.0–46.0)
HEMOGLOBIN: 14 g/dL (ref 12.0–15.0)
LYMPHS ABS: 0.9 10*3/uL (ref 0.7–4.0)
Lymphocytes Relative: 6.4 % — ABNORMAL LOW (ref 12.0–46.0)
MCHC: 33.8 g/dL (ref 30.0–36.0)
MCV: 93.6 fl (ref 78.0–100.0)
MONOS PCT: 2 % — AB (ref 3.0–12.0)
Monocytes Absolute: 0.3 10*3/uL (ref 0.1–1.0)
Neutro Abs: 12.5 10*3/uL — ABNORMAL HIGH (ref 1.4–7.7)
Neutrophils Relative %: 91.5 % — ABNORMAL HIGH (ref 43.0–77.0)
Platelets: 196 10*3/uL (ref 150.0–400.0)
RBC: 4.43 Mil/uL (ref 3.87–5.11)
RDW: 12.9 % (ref 11.5–15.5)
WBC: 13.7 10*3/uL — ABNORMAL HIGH (ref 4.0–10.5)

## 2017-06-29 LAB — LIPID PANEL
CHOLESTEROL: 204 mg/dL — AB (ref 0–200)
HDL: 72.6 mg/dL (ref >39.00)
LDL Cholesterol: 120 mg/dL — ABNORMAL HIGH (ref 0–99)
NONHDL: 131.43
TRIGLYCERIDES: 58 mg/dL (ref 0.0–149.0)
Total CHOL/HDL Ratio: 3
VLDL: 11.6 mg/dL (ref 0.0–40.0)

## 2017-06-29 LAB — TSH: TSH: 0.66 u[IU]/mL (ref 0.35–4.50)

## 2017-07-11 ENCOUNTER — Encounter: Payer: Self-pay | Admitting: Family Medicine

## 2017-07-11 ENCOUNTER — Ambulatory Visit (INDEPENDENT_AMBULATORY_CARE_PROVIDER_SITE_OTHER): Payer: Medicare HMO | Admitting: Family Medicine

## 2017-07-11 VITALS — BP 122/58 | HR 75 | Temp 98.6°F | Ht 60.0 in | Wt 162.2 lb

## 2017-07-11 DIAGNOSIS — D72829 Elevated white blood cell count, unspecified: Secondary | ICD-10-CM

## 2017-07-11 DIAGNOSIS — C50211 Malignant neoplasm of upper-inner quadrant of right female breast: Secondary | ICD-10-CM | POA: Diagnosis not present

## 2017-07-11 DIAGNOSIS — E785 Hyperlipidemia, unspecified: Secondary | ICD-10-CM

## 2017-07-11 DIAGNOSIS — R739 Hyperglycemia, unspecified: Secondary | ICD-10-CM | POA: Insufficient documentation

## 2017-07-11 DIAGNOSIS — D72828 Other elevated white blood cell count: Secondary | ICD-10-CM

## 2017-07-11 DIAGNOSIS — G4700 Insomnia, unspecified: Secondary | ICD-10-CM | POA: Diagnosis not present

## 2017-07-11 HISTORY — DX: Elevated white blood cell count, unspecified: D72.829

## 2017-07-11 LAB — CBC WITH DIFFERENTIAL/PLATELET
BASOS PCT: 0.8 % (ref 0.0–3.0)
Basophils Absolute: 0.1 10*3/uL (ref 0.0–0.1)
EOS PCT: 2.2 % (ref 0.0–5.0)
Eosinophils Absolute: 0.2 10*3/uL (ref 0.0–0.7)
HEMATOCRIT: 40.7 % (ref 36.0–46.0)
HEMOGLOBIN: 13.8 g/dL (ref 12.0–15.0)
LYMPHS PCT: 24.4 % (ref 12.0–46.0)
Lymphs Abs: 1.7 10*3/uL (ref 0.7–4.0)
MCHC: 33.9 g/dL (ref 30.0–36.0)
MCV: 93.9 fl (ref 78.0–100.0)
Monocytes Absolute: 0.5 10*3/uL (ref 0.1–1.0)
Monocytes Relative: 7.2 % (ref 3.0–12.0)
NEUTROS ABS: 4.7 10*3/uL (ref 1.4–7.7)
Neutrophils Relative %: 65.4 % (ref 43.0–77.0)
PLATELETS: 185 10*3/uL (ref 150.0–400.0)
RBC: 4.34 Mil/uL (ref 3.87–5.11)
RDW: 13.3 % (ref 11.5–15.5)
WBC: 7.1 10*3/uL (ref 4.0–10.5)

## 2017-07-11 LAB — HEMOGLOBIN A1C: Hgb A1c MFr Bld: 5.6 % (ref 4.6–6.5)

## 2017-07-11 NOTE — Patient Instructions (Signed)
Great to see you. I will call you with your lab results from today and you can view them online.   Stop taking tums please.  You can take over the counter pepcid or zantac.

## 2017-07-11 NOTE — Progress Notes (Signed)
Subjective:   Patient ID: Kayla Mcgrath, female    DOB: [birth year], 72 y.o.   MRN: [id number]  Kayla Mcgrath is a pleasant 72 y.o. year old female who presents to clinic today with Follow-up (Patient is here today to F/U AWV completed on 6.12.19.  Also labs were completed on same day.  She was fasting and her glucose was 134 and Calcium was 10.9.  She is not currently fasting today.  She states that this month she will be ending her Tx of Tamoifen.)  on 07/11/2017  HPI:  Patient is here today to F/U AWV completed on 6.12.19. Also labs were completed on same day. She was fasting and her glucose was 134 and Calcium was 10.9. She is not currently fasting today.   Note from annual wellness visit on 06/21/17 reviewed.  No results found for: HGBA1C  Hypercalcemia- elevated this month.  Admits to taking tums almost daily for heartburn.  Hyperglycemia- fasting blood glucose was 134.  She did have a cortisone injection the day before she had her labs drawn.  Leukocytosis-  Lab Results  Component Value Date   WBC 13.7 (H) 06/29/2017   HGB 14.0 06/29/2017   HCT 41.4 06/29/2017   MCV 93.6 06/29/2017   PLT 196.0 06/29/2017     . Health Maintenance  Topic Date Due  . INFLUENZA VACCINE  08/10/2017  . COLONOSCOPY  08/29/2018  . MAMMOGRAM  05/16/2019  . TETANUS/TDAP  05/31/2023  . DEXA SCAN  Completed  . Hepatitis C Screening  Completed  . PNA vac Low Risk Adult  Completed   Lab Results  Component Value Date   NA 134 (L) 06/29/2017   K 4.0 06/29/2017   CL 100 06/29/2017   CO2 25 06/29/2017   Breast CA- doing well. S/p mastectomy after recurrence two years ago.  Last saw oncology NP, Dr. Jana Hakim on 11/02/16-  Note reviewed.  Continues on tamoxifen due to vaginal dryness.  She sees him again in July- will likely stop tamoxifen as it has been five years in July.   HLD-  stable with Zetia.  Lab Results  Component Value Date   CHOL 204 (H) 06/29/2017   HDL 72.60 06/29/2017   LDLCALC 120 (H) 06/29/2017   LDLDIRECT 161.9 04/30/2012   TRIG 58.0 06/29/2017   CHOLHDL 3 06/29/2017   Current Outpatient Medications on File Prior to Visit  Medication Sig Dispense Refill  . Cholecalciferol (VITAMIN D) 2000 UNITS CAPS Take one by mouth daily    . ezetimibe (ZETIA) 10 MG tablet TAKE 1 TABLET (10 MG TOTAL) BY MOUTH DAILY. 90 tablet 0  . ketoconazole (NIZORAL) 2 % cream APPLY BY TOPICAL ROUTE EVERY DAY TO THE AFFECTED AREA(S)  2  . tamoxifen (NOLVADEX) 20 MG tablet TAKE 1 TABLET EVERY DAY 90 tablet 4  . traMADol (ULTRAM) 50 MG tablet Take 1 tablet (50 mg total) by mouth every 6 (six) hours as needed. 50 tablet 2   No current facility-administered medications on file prior to visit.     Allergies  Allergen Reactions  . Latex Rash    Past Medical History:  Diagnosis Date  . Breast cancer ([place])   . Colon polyps    ?  Marland Kitchen DCIS (ductal carcinoma in situ) of breast 2010  . Diverticulosis   . Full dentures   . Hyperlipidemia   . Joint pain   . Skin cancer   . Wears glasses     Past Surgical History:  Procedure  Laterality Date  . BREAST BIOPSY Right 05/11/2012  . BREAST BIOPSY Right 04/24/2012  . BREAST LUMPECTOMY Right december 2010   cancer-tamoxifen  . BREAST RECONSTRUCTION WITH PLACEMENT OF TISSUE EXPANDER AND FLEX HD (ACELLULAR HYDRATED DERMIS) Right 06/20/2012   Procedure: BREAST RECONSTRUCTION WITH PLACEMENT OF TISSUE EXPANDER AND FLEX HD (ACELLULAR HYDRATED DERMIS);  Surgeon: Theodoro Kos, DO;  Location: [facility];  Service: Plastics;  Laterality: Right;  . BREAST REDUCTION WITH MASTOPEXY Left 10/11/2012   Procedure: LEFT BREAST REDUCTION WITH MASTOPEXY;  Surgeon: Theodoro Kos, DO;  Location: [facility];  Service: Plastics;  Laterality: Left;  . COLONOSCOPY    . DILATION AND CURETTAGE OF UTERUS    . LIPOSUCTION WITH LIPOFILLING Left 10/11/2012   Procedure: LIPOSUCTION WITH LIPOFILLING;  Surgeon: Theodoro Kos, DO;  Location:  [facility];  Service: Plastics;  Laterality: Left;  Marland Kitchen MASTECTOMY Right 06/20/2012  . MASTECTOMY W/ SENTINEL NODE BIOPSY Right 06/20/2012   Procedure: right skin sparing MASTECTOMY WITH SENTINEL LYMPH NODE BIOPSY;  Surgeon: Stark Klein, MD;  Location: [facility];  Service: General;  Laterality: Right;  . REDUCTION MAMMAPLASTY Left 10/2012  . REMOVAL OF TISSUE EXPANDER AND PLACEMENT OF IMPLANT Right 10/11/2012   Procedure: REMOVAL OF TISSUE EXPANDER AND PLACEMENT OF SILICONE IMPLANT RIGHT;  Surgeon: Theodoro Kos, DO;  Location: [facility];  Service: Plastics;  Laterality: Right;    Family History  Problem Relation Age of Onset  . Alcohol abuse Mother   . Colon polyps Mother   . Mental illness Mother   . Alcohol abuse Father        suicide  . Mental illness Father   . Heart disease Paternal Grandfather     Social History   Socioeconomic History  . Marital status: Married    Spouse name: Not on file  . Number of children: 2  . Years of education: Not on file  . Highest education level: Not on file  Occupational History  . Occupation: retired Corporate treasurer  Social Needs  . Financial resource strain: Not on file  . Food insecurity:    Worry: Not on file    Inability: Not on file  . Transportation needs:    Medical: Not on file    Non-medical: Not on file  Tobacco Use  . Smoking status: Former Smoker    Types: Cigarettes    Last attempt to quit: 06/14/1981    Years since quitting: 36.0  . Smokeless tobacco: Never Used  Substance and Sexual Activity  . Alcohol use: No  . Drug use: No  . Sexual activity: Yes    Birth control/protection: Post-menopausal  Lifestyle  . Physical activity:    Days per week: Not on file    Minutes per session: Not on file  . Stress: Not on file  Relationships  . Social connections:    Talks on phone: Not on file    Gets together: Not on file    Attends religious service: Not on file    Active member of club  or organization: Not on file    Attends meetings of clubs or organizations: Not on file    Relationship status: Not on file  . Intimate partner violence:    Fear of current or ex partner: Not on file    Emotionally abused: Not on file    Physically abused: Not on file    Forced sexual activity: Not on file  Other Topics Concern  .  Not on file  Social History Narrative   Recently moved from [place] to be closer to her two daughters and grandchildren.   Retired Corporate treasurer.   DNR- forms singed on 05/07/2012 and returned to pt.   The PMH, PSH, Social History, Family History, Medications, and allergies have been reviewed in [facility], and have been updated if relevant.   Review of Systems  Constitutional: Negative.   HENT: Negative.   Eyes: Negative.   Respiratory: Negative.   Cardiovascular: Negative.   Gastrointestinal: Negative.   Endocrine: Negative.   Genitourinary: Negative.   Musculoskeletal: Negative.   Skin: Negative.   Allergic/Immunologic: Negative.   Neurological: Negative.   Hematological: Negative.   Psychiatric/Behavioral: Negative.   All other systems reviewed and are negative.      Objective:    BP (!) 122/58 (BP Location: Left Arm, Patient Position: Sitting, Cuff Size: Normal)   Pulse 75   Temp 98.6 F (37 C) (Oral)   Ht 5' (1.524 m)   Wt 162 lb 3.2 oz (73.6 kg)   SpO2 98%   BMI 31.68 kg/m    Physical Exam    General:  Well-developed,well-nourished,in no acute distress; alert,appropriate and cooperative throughout examination Head:  normocephalic and atraumatic.   Eyes:  vision grossly intact, PERRL Ears:  R ear normal and L ear normal externally, TMs clear bilaterally Nose:  no external deformity.   Mouth:  good dentition.   Neck:  No deformities, masses, or tenderness noted. Lungs:  Normal respiratory effort, chest expands symmetrically. Lungs are clear to auscultation, no crackles or wheezes. Heart:  Normal rate and regular rhythm. S1 and S2 normal without  gallop, murmur, click, rub or other extra sounds. Abdomen:  Bowel sounds positive,abdomen soft and non-tender without masses, organomegaly or hernias noted. Msk:  No deformity or scoliosis noted of thoracic or lumbar spine.   Extremities:  No clubbing, cyanosis, edema, or deformity noted with normal full range of motion of all joints.   Neurologic:  alert & oriented X3 and gait normal.   Skin:  Intact without suspicious lesions or rashes Cervical Nodes:  No lymphadenopathy noted Axillary Nodes:  No palpable lymphadenopathy Psych:  Cognition and judgment appear intact. Alert and cooperative with normal attention span and concentration. No apparent delusions, illusions, hallucinations      Assessment & Plan:   Hyperlipidemia, unspecified hyperlipidemia type  Insomnia, unspecified type  Primary cancer of upper inner quadrant of right female breast ([place])  Hyperglycemia - Plan: Hemoglobin A1c  Serum calcium elevated - Plan: PTH, Intact and Calcium  Other elevated white blood cell (WBC) count - Plan: CBC with Differential/Platelet No follow-ups on file.

## 2017-07-11 NOTE — Assessment & Plan Note (Signed)
Followed by Dr. Jana Hakim. On Tamoxifen.  Has follow up with him in July- approaching 5 years since diagnosis and he will likely d/c tamoxifen.

## 2017-07-11 NOTE — Assessment & Plan Note (Signed)
Likely due to cortisone injection. a1c today. The patient indicates understanding of these issues and agrees with the plan.

## 2017-07-11 NOTE — Assessment & Plan Note (Signed)
Likely reactive due to cortisone injection.

## 2017-07-11 NOTE — Assessment & Plan Note (Signed)
Continue current dose of zetia.

## 2017-07-11 NOTE — Assessment & Plan Note (Signed)
Likely due to taking tums.  Advised to d/c this and to start either pepcid or xantac as needed for GERD symptoms. Will check calcium and PTH today as well.

## 2017-07-12 LAB — PTH, INTACT AND CALCIUM
CALCIUM: 9.5 mg/dL (ref 8.6–10.4)
PTH: 178 pg/mL — ABNORMAL HIGH (ref 14–64)

## 2017-07-14 ENCOUNTER — Telehealth: Payer: Self-pay

## 2017-07-14 DIAGNOSIS — E349 Endocrine disorder, unspecified: Secondary | ICD-10-CM

## 2017-07-14 NOTE — Telephone Encounter (Signed)
Pt aware of lab results and agrees to come in for labs on 7.17.19 @ 7:30am to recheck labs/I have created future order/thx dmf

## 2017-07-14 NOTE — Telephone Encounter (Signed)
-----   Message from [name], MD sent at 07/12/2017  4:26 PM EDT ----- Please call pt- her parathyroid hormone was surprisingly elevated .   I'Raigen Jagielski like to simply repeat this in a couple of weeks, along with her calcium, just like we did yesterday.  Her blood sugar and white blood cell count are now normal.  Her calcium is now normal too. If her parathyroid hormone remains elevated, we will simply just watch her calcium intake and levels since she is having no other symptoms at this time.  Please schedule follow up lab visit in 2 weeks to check PTH, intact and calcium (just like this lab), dx- elevated PTH.  Thank you!

## 2017-07-24 ENCOUNTER — Other Ambulatory Visit: Payer: Self-pay | Admitting: *Deleted

## 2017-07-24 DIAGNOSIS — C50211 Malignant neoplasm of upper-inner quadrant of right female breast: Secondary | ICD-10-CM

## 2017-07-24 NOTE — Progress Notes (Signed)
ID: Kayla Mcgrath   DOB: [birth year]  MR#: [id number]  QMV#:[id number]  PCP: [name], MD GYN:  SU: Stark Klein OTHER MD: [name]; [name] [phone]), Theodoro Kos, [name]  CHIEF COMPLAINT:  Right Breast Cancer  CURRENT TREATMENT: Completing 5 years of tamoxifen   HISTORY OF PRESENT ILLNESS: From the original intake note:  Kayla Mcgrath underwent right lumpectomy in December of 2010 for a 1.2 cm ductal carcinoma in situ, intermediate grade, with close but negative margins (1 mm). I do not have the prognostic panel, but per patient and history the tumor was estrogen receptor positive and Martin took tamoxifen for approximately one year. She declined radiation.  She was then followed closely, with mammography every 6 months. On 03/08/2012 a new area of amorphous calcifications measuring 2 mm were noted at the 9:00 position of the right breast. There was also an isodense oval mass measuring 7 mm at the 2:00 position in the same breast. Ultrasound of the right breast confirmed a 6 mm oval hypoechoic mass with microlobulated margins.  The patient was referred to the breast Center for biopsy, and on for 15 2014 underwent biopsy of the small area of calcifications in the right breast as well as the breast mass in the same breast. These showed ([id number]) invasive ductal carcinoma, grade 2 or 3, estrogen receptor 100% positive, progesterone receptor 10% positive, with an MIB-1 of 35%, and no HER-2 amplification. The small area of calcification showed only atypical ductal hyperplasia.  The patient's subsequent history is as detailed below.  INTERVAL HISTORY: Kayla Mcgrath returns today for follow-up and treatment of her estrogen receptor positive breast cancer. She continues on tamoxifen, with good tolerance. She denies issues with hot flashes unless it is hot outside. Instead of increased vaginal wetness, she experiences vaginal dryness, likely due to menopause.  Since her  last visit, she underwent routine screening unilateral left breast mammography with CAD and tomography on 05/15/2017 at [facility] showing: breast density category C. There was no evidence of malignancy.   REVIEW OF SYSTEMS: Kayla Mcgrath reports that she completed blood work under Dr. Deborra Medina recently that showed a high PTH level.  She had knee pain last week and received cortisone shots. She also took tramadol for the pain, but this caused her to be constipated. She denies unusual headaches, visual changes, nausea, vomiting, or dizziness. There has been no unusual cough, phlegm production, or pleurisy. This been no change in bowel or bladder habits. She denies unexplained fatigue or unexplained weight loss, bleeding, rash, or fever. A detailed review of systems was otherwise stable.    PAST MEDICAL HISTORY: Past Medical History:  Diagnosis Date  . Breast cancer ([place])   . Colon polyps    ?  Marland Kitchen DCIS (ductal carcinoma in situ) of breast 2010  . Diverticulosis   . Full dentures   . Hyperlipidemia   . Joint pain   . Skin cancer   . Wears glasses     PAST SURGICAL HISTORY: Past Surgical History:  Procedure Laterality Date  . BREAST BIOPSY Right 05/11/2012  . BREAST BIOPSY Right 04/24/2012  . BREAST LUMPECTOMY Right december 2010   cancer-tamoxifen  . BREAST RECONSTRUCTION WITH PLACEMENT OF TISSUE EXPANDER AND FLEX HD (ACELLULAR HYDRATED DERMIS) Right 06/20/2012   Procedure: BREAST RECONSTRUCTION WITH PLACEMENT OF TISSUE EXPANDER AND FLEX HD (ACELLULAR HYDRATED DERMIS);  Surgeon: Theodoro Kos, DO;  Location: [facility];  Service: Plastics;  Laterality: Right;  . BREAST REDUCTION WITH  MASTOPEXY Left 10/11/2012   Procedure: LEFT BREAST REDUCTION WITH MASTOPEXY;  Surgeon: Theodoro Kos, DO;  Location: [facility];  Service: Plastics;  Laterality: Left;  . COLONOSCOPY    . DILATION AND CURETTAGE OF UTERUS    . LIPOSUCTION WITH LIPOFILLING Left 10/11/2012   Procedure:  LIPOSUCTION WITH LIPOFILLING;  Surgeon: Theodoro Kos, DO;  Location: [facility];  Service: Plastics;  Laterality: Left;  Marland Kitchen MASTECTOMY Right 06/20/2012  . MASTECTOMY W/ SENTINEL NODE BIOPSY Right 06/20/2012   Procedure: right skin sparing MASTECTOMY WITH SENTINEL LYMPH NODE BIOPSY;  Surgeon: Stark Klein, MD;  Location: [facility];  Service: General;  Laterality: Right;  . REDUCTION MAMMAPLASTY Left 10/2012  . REMOVAL OF TISSUE EXPANDER AND PLACEMENT OF IMPLANT Right 10/11/2012   Procedure: REMOVAL OF TISSUE EXPANDER AND PLACEMENT OF SILICONE IMPLANT RIGHT;  Surgeon: Theodoro Kos, DO;  Location: [facility];  Service: Plastics;  Laterality: Right;    FAMILY HISTORY Family History  Problem Relation Age of Onset  . Alcohol abuse Mother   . Colon polyps Mother   . Mental illness Mother   . Alcohol abuse Father        suicide  . Mental illness Father   . Heart disease Paternal Grandfather    the patient's father committed suicide at the age of 38. The patient's mother died at the age of 55 in the setting of alcohol abuse. The patient has one brother, 2 sisters. There is no history of breast or ovarian cancer in the family to the patient's knowledge.  GYNECOLOGIC HISTORY: Menarche age 12, first live birth age 27, the patient is Kayla Mcgrath P2. She went through menopause approximately 1998. She did not take hormone replacement.  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 Her husband Kayla Mcgrath, currently retired, used to Sports administrator. Daughter Kayla Mcgrath lives in [place] and works as an Therapist, sports for [employer] and also at [employer] in [place]. Daughter Kayla Mcgrath is a homemaker in [place]. The patient has 7 grandchildren.. She is a Tourist information centre manager.    ADVANCED DIRECTIVES: Not in place  HEALTH MAINTENANCE: (Updated 11/01/2012) Social History   Tobacco Use  . Smoking status: Former Smoker    Types: Cigarettes    Last  attempt to quit: 06/14/1981    Years since quitting: 36.1  . Smokeless tobacco: Never Used  Substance Use Topics  . Alcohol use: No  . Drug use: No     Colonoscopy: 2009  PAP: 2012  Bone density: 05/11/2016 with a T-score of -1.9 osteopenia  Lipid panel: April 2014, Dr. Deborra Medina   Allergies  Allergen Reactions  . Latex Rash    Current Outpatient Medications  Medication Sig Dispense Refill  . Cholecalciferol (VITAMIN D) 2000 UNITS CAPS Take one by mouth daily    . ezetimibe (ZETIA) 10 MG tablet TAKE 1 TABLET (10 MG TOTAL) BY MOUTH DAILY. 90 tablet 0  . ketoconazole (NIZORAL) 2 % cream APPLY BY TOPICAL ROUTE EVERY DAY TO THE AFFECTED AREA(S)  2  . tamoxifen (NOLVADEX) 20 MG tablet TAKE 1 TABLET EVERY DAY 90 tablet 4  . traMADol (ULTRAM) 50 MG tablet Take 1 tablet (50 mg total) by mouth every 6 (six) hours as needed. 50 tablet 2   No current facility-administered medications for this visit.     OBJECTIVE: Middle-aged white woman in no acute distress  Vitals:   07/25/17 1436  BP: 130/63  Pulse: 78  Resp: 18  Temp: 99.2 F (37.3 C)  SpO2: 98%     Body mass index is 31.64 kg/m.    ECOG FS: 0 Filed Weights   07/25/17 1436  Weight: 162 lb (73.5 kg)   Sclerae unicteric, EOMs intact Oropharynx clear and moist No cervical or supraclavicular adenopathy Lungs no rales or rhonchi Heart regular rate and rhythm Abd soft, nontender, positive bowel sounds MSK no focal spinal tenderness, no upper extremity lymphedema Neuro: nonfocal, well oriented, appropriate affect Breasts: The right breast is status post mastectomy followed by reconstruction.  There is no evidence of local recurrence.  Left breast is benign.  Both axillae are benign.  LAB RESULTS: Lab Results  Component Value Date   WBC 5.5 07/25/2017   NEUTROABS 3.2 07/25/2017   HGB 13.2 07/25/2017   HCT 39.3 07/25/2017   MCV 93.8 07/25/2017   PLT 179 07/25/2017      Chemistry      Component Value Date/Time   NA 134  (Mcgrath) 06/29/2017 0847   NA 140 11/02/2016 1130   K 4.0 06/29/2017 0847   K 3.8 11/02/2016 1130   CL 100 06/29/2017 0847   CL 105 05/02/2012 0822   CO2 25 06/29/2017 0847   CO2 26 11/02/2016 1130   BUN 16 06/29/2017 0847   BUN 14.7 11/02/2016 1130   CREATININE 0.75 06/29/2017 0847   CREATININE 0.8 11/02/2016 1130      Component Value Date/Time   CALCIUM 9.5 07/11/2017 1027   CALCIUM 9.7 11/02/2016 1130   ALKPHOS 61 06/29/2017 0847   ALKPHOS 59 11/02/2016 1130   AST 22 06/29/2017 0847   AST 23 11/02/2016 1130   ALT 19 06/29/2017 0847   ALT 17 11/02/2016 1130   BILITOT 0.6 06/29/2017 0847   BILITOT 0.34 11/02/2016 1130       STUDIES: Since her last visit, she underwent routine screening unilateral left breast mammography with CAD and tomography on 05/15/2017 at [facility] showing: breast density category C. There was no evidence of malignancy.  ASSESSMENT: 72 y.o. Kayla Mcgrath woman  (1) status post right lumpectomy December of 2010 for ductal carcinoma in situ, intermediate grade, estrogen receptor positive, with close but negative margins, the patient refusing adjuvant radiation therapy, but taking adjuvant tamoxifen approximately one year  (2) status post right breast upper inner quadrant biopsy 04/26/2012 for a clinical T1b N0, stage IA invasive ductal carcinoma, intermediate grade, estrogen receptor 100% and progesterone receptor 10% positive, with no HER-2 amplification, and an MIB-1 of 35%  (3) status post right mastectomy and sentinel lymph node sampling 06/20/2012 for a pT1b pN0, stage IA invasive ductal carcinoma, grade 2, again HER-2 negative.  (4) Oncotype DX score of 11 predicting a distant recurrence within 10 years of 8% if the patient's only adjuvant systemic treatment is tamoxifen for 5 years.  (5) began on anastrozole in July 2014, switched to tamoxifen June 2016 because of vaginal atrophy concerns  (6)  status post reconstruction on the right, and  reduction mammoplasty on the left on 10-14 under the care of Dr. Migdalia Dk  (7) osteopenia with DEXA scan 10/02/2012 showing a T score of -2.2  (a) bone density 05/11/2016 showed a T score of -1.9  PLAN:  Kayla Mcgrath is now more than 5 years out from definitive surgery for her breast cancer with no evidence of disease recurrence.  This is very favorable.  She has completed 5 years of tamoxifen.  The benefit of continuing the on 5 years in her situation are very  minimal in terms of breast cancer risk reduction.  Accordingly I am comfortable with her discontinuing that medication  At this point I am comfortable releasing her to her primary care physician.All she will need in terms of breast cancer follow-up is her yearly mammogram and her yearly physician breast exam  With regards to her husband's memory issues I recommend that she read "the 36-hour day".  She wanted to know what surgeon I would use for hyperparathyroid surgery and I did mention Dr. [name].  I will be glad to see Kayla Mcgrath again at any point in the future if and when the need arises, but as of now are making no further routine appointments for her here.   Kayla Mcgrath, Virgie Dad, MD  07/25/17 2:57 PM Medical Oncology and Hematology [facility] [address] [place], [place] [address] Tel. [phone]    Fax. [phone]  Alice Rieger, am acting as scribe for Chauncey Cruel MD.  I, Lurline Del MD, have reviewed the above documentation for accuracy and completeness, and I agree with the above.

## 2017-07-25 ENCOUNTER — Encounter: Payer: Self-pay | Admitting: Oncology

## 2017-07-25 ENCOUNTER — Inpatient Hospital Stay: Payer: Medicare HMO | Attending: Oncology | Admitting: Oncology

## 2017-07-25 ENCOUNTER — Inpatient Hospital Stay: Payer: Medicare HMO

## 2017-07-25 VITALS — BP 130/63 | HR 78 | Temp 99.2°F | Resp 18 | Ht 60.0 in | Wt 162.0 lb

## 2017-07-25 DIAGNOSIS — Z85828 Personal history of other malignant neoplasm of skin: Secondary | ICD-10-CM | POA: Diagnosis not present

## 2017-07-25 DIAGNOSIS — Z9223 Personal history of estrogen therapy: Secondary | ICD-10-CM | POA: Diagnosis not present

## 2017-07-25 DIAGNOSIS — Z79810 Long term (current) use of selective estrogen receptor modulators (SERMs): Secondary | ICD-10-CM | POA: Diagnosis not present

## 2017-07-25 DIAGNOSIS — Z9011 Acquired absence of right breast and nipple: Secondary | ICD-10-CM | POA: Insufficient documentation

## 2017-07-25 DIAGNOSIS — Z87891 Personal history of nicotine dependence: Secondary | ICD-10-CM | POA: Insufficient documentation

## 2017-07-25 DIAGNOSIS — Z86000 Personal history of in-situ neoplasm of breast: Secondary | ICD-10-CM | POA: Diagnosis not present

## 2017-07-25 DIAGNOSIS — Z170 Estrogen receptor positive status [ER+]: Secondary | ICD-10-CM | POA: Diagnosis not present

## 2017-07-25 DIAGNOSIS — E213 Hyperparathyroidism, unspecified: Secondary | ICD-10-CM | POA: Diagnosis not present

## 2017-07-25 DIAGNOSIS — Z79899 Other long term (current) drug therapy: Secondary | ICD-10-CM | POA: Insufficient documentation

## 2017-07-25 DIAGNOSIS — C50211 Malignant neoplasm of upper-inner quadrant of right female breast: Secondary | ICD-10-CM | POA: Insufficient documentation

## 2017-07-25 DIAGNOSIS — E785 Hyperlipidemia, unspecified: Secondary | ICD-10-CM | POA: Insufficient documentation

## 2017-07-25 LAB — CMP (CANCER CENTER ONLY)
ALK PHOS: 74 U/L (ref 38–126)
ALT: 19 U/L (ref 0–44)
AST: 22 U/L (ref 15–41)
Albumin: 3.7 g/dL (ref 3.5–5.0)
Anion gap: 7 (ref 5–15)
BILIRUBIN TOTAL: 0.3 mg/dL (ref 0.3–1.2)
BUN: 14 mg/dL (ref 8–23)
CALCIUM: 10 mg/dL (ref 8.9–10.3)
CO2: 28 mmol/L (ref 22–32)
CREATININE: 0.81 mg/dL (ref 0.44–1.00)
Chloride: 104 mmol/L (ref 98–111)
Glucose, Bld: 106 mg/dL — ABNORMAL HIGH (ref 70–99)
Potassium: 3.7 mmol/L (ref 3.5–5.1)
Sodium: 139 mmol/L (ref 135–145)
TOTAL PROTEIN: 6.7 g/dL (ref 6.5–8.1)

## 2017-07-25 LAB — CBC WITH DIFFERENTIAL (CANCER CENTER ONLY)
Basophils Absolute: 0.1 10*3/uL (ref 0.0–0.1)
Basophils Relative: 1 %
EOS PCT: 3 %
Eosinophils Absolute: 0.2 10*3/uL (ref 0.0–0.5)
HCT: 39.3 % (ref 34.8–46.6)
HEMOGLOBIN: 13.2 g/dL (ref 11.6–15.9)
LYMPHS ABS: 1.6 10*3/uL (ref 0.9–3.3)
LYMPHS PCT: 30 %
MCH: 31.4 pg (ref 25.1–34.0)
MCHC: 33.5 g/dL (ref 31.5–36.0)
MCV: 93.8 fL (ref 79.5–101.0)
MONOS PCT: 8 %
Monocytes Absolute: 0.5 10*3/uL (ref 0.1–0.9)
NEUTROS PCT: 58 %
Neutro Abs: 3.2 10*3/uL (ref 1.5–6.5)
Platelet Count: 179 10*3/uL (ref 145–400)
RBC: 4.19 MIL/uL (ref 3.70–5.45)
RDW: 13.4 % (ref 11.2–14.5)
WBC: 5.5 10*3/uL (ref 3.9–10.3)

## 2017-07-26 ENCOUNTER — Telehealth: Payer: Self-pay | Admitting: Oncology

## 2017-07-26 ENCOUNTER — Other Ambulatory Visit (INDEPENDENT_AMBULATORY_CARE_PROVIDER_SITE_OTHER): Payer: Medicare HMO

## 2017-07-26 DIAGNOSIS — E349 Endocrine disorder, unspecified: Secondary | ICD-10-CM | POA: Diagnosis not present

## 2017-07-26 NOTE — Telephone Encounter (Signed)
Per 7/16 no los °

## 2017-07-28 LAB — PTH, INTACT AND CALCIUM
Calcium: 9.7 mg/dL (ref 8.6–10.4)
PTH: 156 pg/mL — ABNORMAL HIGH (ref 14–64)

## 2017-08-07 ENCOUNTER — Ambulatory Visit (INDEPENDENT_AMBULATORY_CARE_PROVIDER_SITE_OTHER): Payer: Medicare HMO | Admitting: Family Medicine

## 2017-08-07 ENCOUNTER — Encounter: Payer: Self-pay | Admitting: Family Medicine

## 2017-08-07 VITALS — BP 112/62 | HR 76 | Temp 98.6°F | Ht 60.0 in | Wt 162.0 lb

## 2017-08-07 DIAGNOSIS — E213 Hyperparathyroidism, unspecified: Secondary | ICD-10-CM

## 2017-08-07 NOTE — Assessment & Plan Note (Signed)
>  15 minutes spent in face to face time with patient, >50% spent in counselling or coordination of care.  I answered questions she and her husband had.  We agreed to proceed with surgery referral and repeat calcium and PTH today. Orders Placed This Encounter  Procedures  . PTH, Intact and Calcium  . Ambulatory referral to General Surgery

## 2017-08-07 NOTE — Progress Notes (Signed)
Subjective:   Patient ID: Kayla Mcgrath, female    DOB: 11/27/1945, 72 y.o.   MRN: [id number]  Kayla Mcgrath is a pleasant 72 y.o. year old female who presents to clinic today with here husband to discuss Referral (Patient is here today to discuss referral for Parathyroid.  On 7.2.19 it was 178 and on 7.17.19 it was 156.  )  on 08/07/2017  HPI:  Elevated PTH-  Initially calcium elevated on routine CMET on 06/29/17. At that time, she had reported taking tums so I advised her to stop doing that and we would repeat her calcium and PTH in 2-3 weeks. On repeat labs on 07/11/17  calcium normal but PTH elevated to 178.  Since calcium was normal, I advised that we just watch her calcium levels since she was having no other symptoms and repeat PTH and calcium in another two weeks.  Labs on 07/26/17 showed PTH still elevated and again calcium normal.  She remains asymptomatic. I advised that we refer to surgeon for evaluation although surgery may not be needed but could be a necessary treatment at some point.  She is here to discuss this today.     Current Outpatient Medications on File Prior to Visit  Medication Sig Dispense Refill  . Cholecalciferol (VITAMIN D) 2000 UNITS CAPS Take one by mouth daily    . ezetimibe (ZETIA) 10 MG tablet TAKE 1 TABLET (10 MG TOTAL) BY MOUTH DAILY. 90 tablet 0  . ketoconazole (NIZORAL) 2 % cream APPLY BY TOPICAL ROUTE EVERY DAY TO THE AFFECTED AREA(S)  2  . traMADol (ULTRAM) 50 MG tablet Take 1 tablet (50 mg total) by mouth every 6 (six) hours as needed. 50 tablet 2   No current facility-administered medications on file prior to visit.     Allergies  Allergen Reactions  . Latex Rash    Past Medical History:  Diagnosis Date  . Breast cancer ([facility])   . Colon polyps    ?  Marland Kitchen DCIS (ductal carcinoma in situ) of breast 2010  . Diverticulosis   . Full dentures   . Hyperlipidemia   . Joint pain   . Skin cancer   . Wears glasses     Past Surgical History:   Procedure Laterality Date  . BREAST BIOPSY Right 05/11/2012  . BREAST BIOPSY Right 04/24/2012  . BREAST LUMPECTOMY Right december 2010   cancer-tamoxifen  . BREAST RECONSTRUCTION WITH PLACEMENT OF TISSUE EXPANDER AND FLEX HD (ACELLULAR HYDRATED DERMIS) Right 06/20/2012   Procedure: BREAST RECONSTRUCTION WITH PLACEMENT OF TISSUE EXPANDER AND FLEX HD (ACELLULAR HYDRATED DERMIS);  Surgeon: Theodoro Kos, DO;  Location: [facility];  Service: Plastics;  Laterality: Right;  . BREAST REDUCTION WITH MASTOPEXY Left 10/11/2012   Procedure: LEFT BREAST REDUCTION WITH MASTOPEXY;  Surgeon: Theodoro Kos, DO;  Location: [facility];  Service: Plastics;  Laterality: Left;  . COLONOSCOPY    . DILATION AND CURETTAGE OF UTERUS    . LIPOSUCTION WITH LIPOFILLING Left 10/11/2012   Procedure: LIPOSUCTION WITH LIPOFILLING;  Surgeon: Theodoro Kos, DO;  Location: [facility];  Service: Plastics;  Laterality: Left;  Marland Kitchen MASTECTOMY Right 06/20/2012  . MASTECTOMY W/ SENTINEL NODE BIOPSY Right 06/20/2012   Procedure: right skin sparing MASTECTOMY WITH SENTINEL LYMPH NODE BIOPSY;  Surgeon: Stark Klein, MD;  Location: [facility];  Service: General;  Laterality: Right;  . REDUCTION MAMMAPLASTY Left 10/2012  . REMOVAL OF TISSUE EXPANDER AND PLACEMENT OF IMPLANT Right  10/11/2012   Procedure: REMOVAL OF TISSUE EXPANDER AND PLACEMENT OF SILICONE IMPLANT RIGHT;  Surgeon: Theodoro Kos, DO;  Location: [facility];  Service: Plastics;  Laterality: Right;    Family History  Problem Relation Age of Onset  . Alcohol abuse Mother   . Colon polyps Mother   . Mental illness Mother   . Alcohol abuse Father        suicide  . Mental illness Father   . Heart disease Paternal Grandfather     Social History   Socioeconomic History  . Marital status: Married    Spouse name: Not on file  . Number of children: 2  . Years of education: Not on file  . Highest  education level: Not on file  Occupational History  . Occupation: retired Corporate treasurer  Social Needs  . Financial resource strain: Not on file  . Food insecurity:    Worry: Not on file    Inability: Not on file  . Transportation needs:    Medical: Not on file    Non-medical: Not on file  Tobacco Use  . Smoking status: Former Smoker    Types: Cigarettes    Last attempt to quit: 06/14/1981    Years since quitting: 36.1  . Smokeless tobacco: Never Used  Substance and Sexual Activity  . Alcohol use: No  . Drug use: No  . Sexual activity: Yes    Birth control/protection: Post-menopausal  Lifestyle  . Physical activity:    Days per week: Not on file    Minutes per session: Not on file  . Stress: Not on file  Relationships  . Social connections:    Talks on phone: Not on file    Gets together: Not on file    Attends religious service: Not on file    Active member of club or organization: Not on file    Attends meetings of clubs or organizations: Not on file    Relationship status: Not on file  . Intimate partner violence:    Fear of current or ex partner: Not on file    Emotionally abused: Not on file    Physically abused: Not on file    Forced sexual activity: Not on file  Other Topics Concern  . Not on file  Social History Narrative   Recently moved from [place] to be closer to her two daughters and grandchildren.   Retired Corporate treasurer.   DNR- forms singed on 05/07/2012 and returned to pt.   The PMH, PSH, Social History, Family History, Medications, and allergies have been reviewed in [facility], and have been updated if relevant.   Review of Systems  Constitutional: Negative.   Musculoskeletal: Negative.   Psychiatric/Behavioral: Negative.   All other systems reviewed and are negative.      Objective:    BP 112/62 (BP Location: Left Arm, Patient Position: Sitting, Cuff Size: Normal)   Pulse 76   Temp 99.3 F (37.4 C) (Oral)   Ht 5' (1.524 m)   Wt 162 lb (73.5 kg)   SpO2 97%   BMI 31.64  kg/m    Physical Exam  Constitutional: She is oriented to person, place, and time. She appears well-developed and well-nourished. No distress.  HENT:  Head: Normocephalic and atraumatic.  Eyes: EOM are normal.  Cardiovascular: Normal rate.  Pulmonary/Chest: Effort normal.  Musculoskeletal: Normal range of motion.  Neurological: She is alert and oriented to person, place, and time. No cranial nerve deficit.  Skin: Skin is warm and  dry. She is not diaphoretic.  Psychiatric: She has a normal mood and affect. Her behavior is normal. Judgment and thought content normal.  Nursing note and vitals reviewed.         Assessment & Plan:   Hyperparathyroidism ([place]) No follow-ups on file.

## 2017-08-07 NOTE — Patient Instructions (Signed)
Great to see you. I will call you with your lab results from today and you can view them online.   We will call you with an appointment to see Dr. Harlow Asa.

## 2017-08-08 ENCOUNTER — Encounter: Payer: Self-pay | Admitting: Family Medicine

## 2017-08-08 LAB — PTH, INTACT AND CALCIUM
Calcium: 10.1 mg/dL (ref 8.6–10.4)
PTH: 161 pg/mL — AB (ref 14–64)

## 2017-08-10 ENCOUNTER — Other Ambulatory Visit: Payer: Self-pay | Admitting: Family Medicine

## 2017-08-28 DIAGNOSIS — Z853 Personal history of malignant neoplasm of breast: Secondary | ICD-10-CM | POA: Diagnosis not present

## 2017-08-30 ENCOUNTER — Other Ambulatory Visit (HOSPITAL_COMMUNITY): Payer: Self-pay | Admitting: Surgery

## 2017-09-12 ENCOUNTER — Encounter (HOSPITAL_COMMUNITY)
Admission: RE | Admit: 2017-09-12 | Discharge: 2017-09-12 | Disposition: A | Discharge status: Home / Self Care | Payer: Medicare HMO | Source: Ambulatory Visit | Attending: Surgery | Admitting: Surgery

## 2017-09-12 DIAGNOSIS — E213 Hyperparathyroidism, unspecified: Secondary | ICD-10-CM | POA: Diagnosis not present

## 2017-09-12 DIAGNOSIS — E042 Nontoxic multinodular goiter: Secondary | ICD-10-CM | POA: Diagnosis not present

## 2017-09-12 MED ORDER — TECHNETIUM TC 99M SESTAMIBI GENERIC - CARDIOLITE
24.0000 | Freq: Once | INTRAVENOUS | Status: AC | PRN
  Administered 2017-09-12: 24 via INTRAVENOUS

## 2017-09-26 ENCOUNTER — Ambulatory Visit: Payer: Self-pay | Admitting: Surgery

## 2017-10-27 NOTE — Patient Instructions (Addendum)
Kayla Mcgrath  10/27/2017   Your procedure is scheduled on: 11-07-17     Report to [facility] Main  Entrance    Report to Admitting at 5:30 AM    Call this number if you have problems the morning of surgery [phone]     Remember: Do not eat food or drink liquids :After Midnight.    BRUSH YOUR TEETH MORNING OF SURGERY AND RINSE YOUR MOUTH OUT, NO CHEWING GUM CANDY OR MINTS.     Take these medicines the morning of surgery with A SIP OF WATER: Ezetimibe (Zetia)                                You may not have any metal on your body including hair pins and              piercings  Do not wear jewelry, make-up, lotions, powders or perfumes, deodorant             Do not wear nail polish.  Do not shave  48 hours prior to surgery.               Do not bring valuables to the hospital. Section.  Contacts, dentures or bridgework may not be worn into surgery.      Patients discharged the day of surgery will not be allowed to drive home.  Name and phone number of your driver Kayla Mcgrath [phone]  Special Instructions: N/A              Please read over the following fact sheets you were given: _____________________________________________________________________             [facility] - Preparing for Surgery Before surgery, you can play an important role.  Because skin is not sterile, your skin needs to be as free of germs as possible.  You can reduce the number of germs on your skin by washing with CHG (chlorahexidine gluconate) soap before surgery.  CHG is an antiseptic cleaner which kills germs and bonds with the skin to continue killing germs even after washing. Please DO NOT use if you have an allergy to CHG or antibacterial soaps.  If your skin becomes reddened/irritated stop using the CHG and inform your nurse when you arrive at Short Stay. Do not shave (including legs and underarms) for at  least 48 hours prior to the first CHG shower.  You may shave your face/neck. Please follow these instructions carefully:  1.  Shower with CHG Soap the night before surgery and the  morning of Surgery.  2.  If you choose to wash your hair, wash your hair first as usual with your  normal  shampoo.  3.  After you shampoo, rinse your hair and body thoroughly to remove the  shampoo.                           4.  Use CHG as you would any other liquid soap.  You can apply chg directly  to the skin and wash                       Gently with a scrungie  or clean washcloth.  5.  Apply the CHG Soap to your body ONLY FROM THE NECK DOWN.   Do not use on face/ open                           Wound or open sores. Avoid contact with eyes, ears mouth and genitals (private parts).                       Wash face,  Genitals (private parts) with your normal soap.             6.  Wash thoroughly, paying special attention to the area where your surgery  will be performed.  7.  Thoroughly rinse your body with warm water from the neck down.  8.  DO NOT shower/wash with your normal soap after using and rinsing off  the CHG Soap.                9.  Pat yourself dry with a clean towel.            10.  Wear clean pajamas.            11.  Place clean sheets on your bed the night of your first shower and do not  sleep with pets. Day of Surgery : Do not apply any lotions/deodorants the morning of surgery.  Please wear clean clothes to the hospital/surgery center.  FAILURE TO FOLLOW THESE INSTRUCTIONS MAY RESULT IN THE CANCELLATION OF YOUR SURGERY PATIENT SIGNATURE_________________________________  NURSE SIGNATURE__________________________________  ________________________________________________________________________

## 2017-10-30 ENCOUNTER — Ambulatory Visit (HOSPITAL_COMMUNITY)
Admission: RE | Admit: 2017-10-30 | Discharge: 2017-10-30 | Disposition: A | Discharge status: Home / Self Care | Payer: Medicare HMO | Source: Ambulatory Visit | Attending: Anesthesiology | Admitting: Anesthesiology

## 2017-10-30 ENCOUNTER — Encounter (HOSPITAL_COMMUNITY)
Admission: RE | Admit: 2017-10-30 | Discharge: 2017-10-30 | Disposition: A | Discharge status: Home / Self Care | Payer: Medicare HMO | Source: Ambulatory Visit | Attending: Surgery | Admitting: Surgery

## 2017-10-30 ENCOUNTER — Other Ambulatory Visit: Payer: Self-pay

## 2017-10-30 ENCOUNTER — Encounter (HOSPITAL_COMMUNITY): Payer: Self-pay

## 2017-10-30 DIAGNOSIS — R05 Cough: Secondary | ICD-10-CM | POA: Diagnosis not present

## 2017-10-30 DIAGNOSIS — Z01818 Encounter for other preprocedural examination: Secondary | ICD-10-CM | POA: Diagnosis not present

## 2017-10-30 DIAGNOSIS — E213 Hyperparathyroidism, unspecified: Secondary | ICD-10-CM | POA: Diagnosis not present

## 2017-10-30 HISTORY — DX: Adverse effect of unspecified anesthetic, initial encounter: T41.45XA

## 2017-10-30 HISTORY — DX: Other specified postprocedural states: Z98.890

## 2017-10-30 HISTORY — DX: Other complications of anesthesia, initial encounter: T88.59XA

## 2017-10-30 HISTORY — DX: Nausea with vomiting, unspecified: R11.2

## 2017-10-30 LAB — CBC
HEMATOCRIT: 43.3 % (ref 36.0–46.0)
HEMOGLOBIN: 14.1 g/dL (ref 12.0–15.0)
MCH: 31.2 pg (ref 26.0–34.0)
MCHC: 32.6 g/dL (ref 30.0–36.0)
MCV: 95.8 fL (ref 80.0–100.0)
Platelets: 205 10*3/uL (ref 150–400)
RBC: 4.52 MIL/uL (ref 3.87–5.11)
RDW: 12.4 % (ref 11.5–15.5)
WBC: 5.3 10*3/uL (ref 4.0–10.5)
nRBC: 0 % (ref 0.0–0.2)

## 2017-10-30 LAB — BASIC METABOLIC PANEL
ANION GAP: 6 (ref 5–15)
BUN: 13 mg/dL (ref 8–23)
CHLORIDE: 106 mmol/L (ref 98–111)
CO2: 25 mmol/L (ref 22–32)
Calcium: 9.8 mg/dL (ref 8.9–10.3)
Creatinine, Ser: 0.6 mg/dL (ref 0.44–1.00)
GFR calc Af Amer: >60 mL/min (ref >60)
GFR calc non Af Amer: >60 mL/min (ref >60)
GLUCOSE: 96 mg/dL (ref 70–99)
POTASSIUM: 4.1 mmol/L (ref 3.5–5.1)
Sodium: 137 mmol/L (ref 135–145)

## 2017-10-30 NOTE — Progress Notes (Signed)
10-30-17 ([facility]) CXR routed to Dr. Harlow Asa for review.

## 2017-11-06 ENCOUNTER — Encounter (HOSPITAL_COMMUNITY): Payer: Self-pay | Admitting: Surgery

## 2017-11-06 NOTE — H&P (Signed)
General Surgery [facility]  Kayla Mcgrath DOB: April 22, 1945 Married / Language: English / Race: White Female   History of Present Illness  The patient is a 72 year old female who presents with primary hyperparathyroidism.  CHIEF COMPLAINT: hypercalcemia, rule out hyperparathyroidism  Patient is referred by Dr. Arnette Norris for evaluation and management of hypercalcemia and possibly primary hyperparathyroidism. Patient has been noted on routine laboratory studies to have elevated intact parathyroid hormone levels ranging from 156-178. At the same time her calcium level has ranged from 9.5-10.1. Patient denies any complications. She has had no recent fractures. She denies nephrolithiasis. She denies fatigue. She does have a history of osteopenia. Patient has had no prior surgery on the head or neck. There is a family history of possible parathyroid disease and an older sister who underwent surgery in [place]. Patient denies tremors or palpitations.   Past Surgical History  Breast Mass; Local Excision  Right. Breast Reconstruction  Right. Colon Polyp Removal - Colonoscopy  Mammoplasty; Reduction  Left. Mastectomy  Right. Oral Surgery   Diagnostic Studies History Colonoscopy  1-5 years ago Mammogram  within last year  Allergies No Known Drug Allergies [08/28/2017]:  Medication History Ezetimibe (10MG  Tablet, Oral) Active. Vitamin D (2000UNIT Tablet, Oral) Active. Medications Reconciled  Social History Alcohol use  Remotely quit alcohol use. Caffeine use  Coffee. No drug use  Tobacco use  Former smoker.  Family History Alcohol Abuse  Brother, Father, Mother. Ovarian Cancer  Sister.  Pregnancy / Birth History Age at menarche  71 years. Age of menopause  41-50 Gravida  3 Length (months) of breastfeeding  7-12 Maternal age  56-25 Para  2  Other Problems Breast Cancer  Hypercholesterolemia   Review of  Systems Skin Not Present- Change in Wart/Mole, Dryness, Hives, Jaundice, New Lesions, Non-Healing Wounds, Rash and Ulcer. HEENT Not Present- Earache, Hearing Loss, Hoarseness, Nose Bleed, Oral Ulcers, Ringing in the Ears, Seasonal Allergies, Sinus Pain, Sore Throat, Visual Disturbances, Wears glasses/contact lenses and Yellow Eyes. Gastrointestinal Not Present- Abdominal Pain, Bloating, Bloody Stool, Change in Bowel Habits, Chronic diarrhea, Constipation, Difficulty Swallowing, Excessive gas, Gets full quickly at meals, Hemorrhoids, Indigestion, Nausea, Rectal Pain and Vomiting. Female Genitourinary Present- Frequency. Not Present- Nocturia, Painful Urination, Pelvic Pain and Urgency. Musculoskeletal Not Present- Back Pain, Joint Pain, Joint Stiffness, Muscle Pain, Muscle Weakness and Swelling of Extremities. Neurological Not Present- Decreased Memory, Fainting, Headaches, Numbness, Seizures, Tingling, Tremor, Trouble walking and Weakness. Endocrine Not Present- Cold Intolerance, Excessive Hunger, Hair Changes, Heat Intolerance, Hot flashes and New Diabetes. Hematology Not Present- Blood Thinners, Easy Bruising, Excessive bleeding, Gland problems, HIV and Persistent Infections.  Vitals Weight: 163.2 lb Height: 60in Body Surface Area: 1.71 m Body Mass Index: 31.87 kg/m  Temp.: 98.51F(Oral)  Pulse: 91 (Regular)  BP: 118/74 (Sitting, Left Arm, Standard)  Physical Exam   See vital signs recorded above  GENERAL APPEARANCE Development: normal Nutritional status: normal Gross deformities: none  SKIN Rash, lesions, ulcers: none Induration, erythema: none Nodules: none palpable  EYES Conjunctiva and lids: normal Pupils: equal and reactive Iris: normal bilaterally  EARS, NOSE, MOUTH, THROAT External ears: no lesion or deformity External nose: no lesion or deformity Hearing: grossly normal Lips: no lesion or deformity Dentition: normal for age Oral mucosa:  moist  NECK Symmetric: yes Trachea: midline Thyroid: no palpable nodules in the thyroid bed  CHEST Respiratory effort: normal Retraction or accessory muscle use: no Breath sounds: normal bilaterally Rales, rhonchi, wheeze: none  CARDIOVASCULAR Auscultation:  regular rhythm, normal rate Murmurs: none Pulses: carotid and radial pulse 2+ palpable Lower extremity edema: none Lower extremity varicosities: none  MUSCULOSKELETAL Station and gait: normal Digits and nails: no clubbing or cyanosis Muscle strength: grossly normal all extremities Range of motion: grossly normal all extremities Deformity: none  LYMPHATIC Cervical: none palpable Supraclavicular: none palpable  PSYCHIATRIC Oriented to person, place, and time: yes Mood and affect: normal for situation Judgment and insight: appropriate for situation    Assessment & Plan  HYPERCALCEMIA (E83.52)  Pt Education - Pamphlet Given - The Parathyroid Surgery Book: discussed with patient and provided information.  Follow Up - Call [facility] office after tests / studies doneto discuss further plans  Patient presents on referral from her primary care physician for evaluation of elevated intact PTH level and history of hypercalcemia. Patient is provided with written literature on parathyroid disease to review at home.  Patient has a markedly elevated intact PTH level. Calcium levels have been in the upper range of normal. Patient has not had any imaging studies.  I recommend obtaining a 25-hydroxy vitamin D level. We will also obtain a 24-hour urine collection for calcium. Patient will be scheduled for imaging studies of the neck to include an ultrasound examination and a nuclear medicine parathyroid scan. Once the results of all these studies are available, I will contact the patient. If the imaging studies are unrevealing, we may consider a 4D CT scan of the neck in hopes of picking up a parathyroid adenoma. If we localized  the parathyroid adenoma with the imaging studies, then I believe the patient will be a good candidate for minimally invasive outpatient surgery. We discussed that briefly today. Patient will return to the office after her studies are completed to review the results and to make a decision regarding surgical intervention.  Patient will have the above studies performed. We will contact her when the results are available.  ADDENDUM  USN and sestamibi consistent with a left inferior parathyroid adenoma.  Plan minimally invasive surgery.  The risks and benefits of the procedure have been discussed at length with the patient.  The patient understands the proposed procedure, potential alternative treatments, and the course of recovery to be expected.  All of the patient's questions have been answered at this time.  The patient wishes to proceed with surgery.  [name], [facility] Office: [phone]

## 2017-11-07 ENCOUNTER — Observation Stay (HOSPITAL_COMMUNITY)
Admission: RE | Admit: 2017-11-07 | Discharge: 2017-11-08 | Disposition: A | Discharge status: Home / Self Care | Payer: Medicare HMO | Source: Other Acute Inpatient Hospital | Attending: Surgery | Admitting: Surgery

## 2017-11-07 ENCOUNTER — Encounter (HOSPITAL_COMMUNITY): Payer: Self-pay

## 2017-11-07 ENCOUNTER — Other Ambulatory Visit: Payer: Self-pay

## 2017-11-07 ENCOUNTER — Ambulatory Visit (HOSPITAL_COMMUNITY): Payer: Medicare HMO | Admitting: Certified Registered Nurse Anesthetist

## 2017-11-07 ENCOUNTER — Encounter (HOSPITAL_COMMUNITY)
Admission: RE | Disposition: A | Discharge status: Home / Self Care | Payer: Self-pay | Source: Other Acute Inpatient Hospital | Attending: Surgery

## 2017-11-07 DIAGNOSIS — E210 Primary hyperparathyroidism: Secondary | ICD-10-CM | POA: Diagnosis present

## 2017-11-07 DIAGNOSIS — E214 Other specified disorders of parathyroid gland: Principal | ICD-10-CM | POA: Insufficient documentation

## 2017-11-07 DIAGNOSIS — Z87891 Personal history of nicotine dependence: Secondary | ICD-10-CM | POA: Insufficient documentation

## 2017-11-07 DIAGNOSIS — Z23 Encounter for immunization: Secondary | ICD-10-CM | POA: Insufficient documentation

## 2017-11-07 DIAGNOSIS — E7800 Pure hypercholesterolemia, unspecified: Secondary | ICD-10-CM | POA: Insufficient documentation

## 2017-11-07 DIAGNOSIS — E213 Hyperparathyroidism, unspecified: Secondary | ICD-10-CM | POA: Diagnosis present

## 2017-11-07 DIAGNOSIS — Z79899 Other long term (current) drug therapy: Secondary | ICD-10-CM | POA: Insufficient documentation

## 2017-11-07 DIAGNOSIS — D351 Benign neoplasm of parathyroid gland: Secondary | ICD-10-CM | POA: Diagnosis not present

## 2017-11-07 HISTORY — PX: PARATHYROIDECTOMY: SHX19

## 2017-11-07 SURGERY — PARATHYROIDECTOMY
Anesthesia: General | Site: Neck | Laterality: Left

## 2017-11-07 MED ORDER — ONDANSETRON HCL 4 MG/2ML IJ SOLN
INTRAMUSCULAR | Status: AC
  Filled 2017-11-07: qty 2

## 2017-11-07 MED ORDER — ACETAMINOPHEN 325 MG PO TABS
650.0000 mg | ORAL_TABLET | Freq: Four times a day (QID) | ORAL | Status: DC | PRN
  Administered 2017-11-07 – 2017-11-08 (×2): 650 mg via ORAL
  Filled 2017-11-07 (×2): qty 2

## 2017-11-07 MED ORDER — PHENYLEPHRINE 40 MCG/ML (10ML) SYRINGE FOR IV PUSH (FOR BLOOD PRESSURE SUPPORT)
PREFILLED_SYRINGE | INTRAVENOUS | Status: AC
  Filled 2017-11-07: qty 10

## 2017-11-07 MED ORDER — DEXAMETHASONE SODIUM PHOSPHATE 10 MG/ML IJ SOLN
INTRAMUSCULAR | Status: AC
  Filled 2017-11-07: qty 1

## 2017-11-07 MED ORDER — PROPOFOL 10 MG/ML IV BOLUS
INTRAVENOUS | Status: AC
  Filled 2017-11-07: qty 20

## 2017-11-07 MED ORDER — BUPIVACAINE HCL (PF) 0.25 % IJ SOLN
INTRAMUSCULAR | Status: AC
  Filled 2017-11-07: qty 30

## 2017-11-07 MED ORDER — ROCURONIUM BROMIDE 50 MG/5ML IV SOSY
PREFILLED_SYRINGE | INTRAVENOUS | Status: DC | PRN
  Administered 2017-11-07: 40 mg via INTRAVENOUS
  Administered 2017-11-07: 10 mg via INTRAVENOUS

## 2017-11-07 MED ORDER — CHLORHEXIDINE GLUCONATE CLOTH 2 % EX PADS: 6.0000 | MEDICATED_PAD | Freq: Once | CUTANEOUS | Status: DC

## 2017-11-07 MED ORDER — ONDANSETRON HCL 4 MG/2ML IJ SOLN
INTRAMUSCULAR | Status: DC | PRN
  Administered 2017-11-07: 4 mg via INTRAVENOUS

## 2017-11-07 MED ORDER — EPHEDRINE 5 MG/ML INJ
INTRAVENOUS | Status: AC
  Filled 2017-11-07: qty 10

## 2017-11-07 MED ORDER — TRAMADOL HCL 50 MG PO TABS: 50.0000 mg | ORAL_TABLET | Freq: Four times a day (QID) | ORAL | Status: DC | PRN

## 2017-11-07 MED ORDER — CEFAZOLIN SODIUM-DEXTROSE 2-4 GM/100ML-% IV SOLN
2.0000 g | INTRAVENOUS | Status: AC
  Administered 2017-11-07: 2 g via INTRAVENOUS
  Filled 2017-11-07: qty 100

## 2017-11-07 MED ORDER — ONDANSETRON 4 MG PO TBDP: 4.0000 mg | ORAL_TABLET | Freq: Four times a day (QID) | ORAL | Status: DC | PRN

## 2017-11-07 MED ORDER — FENTANYL CITRATE (PF) 250 MCG/5ML IJ SOLN
INTRAMUSCULAR | Status: AC
  Filled 2017-11-07: qty 5

## 2017-11-07 MED ORDER — DEXAMETHASONE SODIUM PHOSPHATE 10 MG/ML IJ SOLN
INTRAMUSCULAR | Status: DC | PRN
  Administered 2017-11-07: 10 mg via INTRAVENOUS

## 2017-11-07 MED ORDER — KCL IN DEXTROSE-NACL 20-5-0.45 MEQ/L-%-% IV SOLN
INTRAVENOUS | Status: DC
  Filled 2017-11-07: qty 1000

## 2017-11-07 MED ORDER — OXYCODONE HCL 5 MG PO TABS: 5.0000 mg | ORAL_TABLET | Freq: Once | ORAL | Status: DC | PRN

## 2017-11-07 MED ORDER — HYDROCODONE-ACETAMINOPHEN 5-325 MG PO TABS
1.0000 | ORAL_TABLET | ORAL | Status: DC | PRN
  Administered 2017-11-07 (×2): 2 via ORAL
  Filled 2017-11-07 (×2): qty 2

## 2017-11-07 MED ORDER — LIDOCAINE 2% (20 MG/ML) 5 ML SYRINGE
INTRAMUSCULAR | Status: AC
  Filled 2017-11-07: qty 5

## 2017-11-07 MED ORDER — ONDANSETRON HCL 4 MG/2ML IJ SOLN
4.0000 mg | Freq: Four times a day (QID) | INTRAMUSCULAR | Status: DC | PRN
  Administered 2017-11-07: 4 mg via INTRAVENOUS
  Filled 2017-11-07: qty 2

## 2017-11-07 MED ORDER — PHENYLEPHRINE 40 MCG/ML (10ML) SYRINGE FOR IV PUSH (FOR BLOOD PRESSURE SUPPORT)
PREFILLED_SYRINGE | INTRAVENOUS | Status: DC | PRN
  Administered 2017-11-07: 80 ug via INTRAVENOUS
  Administered 2017-11-07: 40 ug via INTRAVENOUS
  Administered 2017-11-07: 80 ug via INTRAVENOUS
  Administered 2017-11-07: 40 ug via INTRAVENOUS

## 2017-11-07 MED ORDER — SUGAMMADEX SODIUM 200 MG/2ML IV SOLN
INTRAVENOUS | Status: AC
  Filled 2017-11-07: qty 2

## 2017-11-07 MED ORDER — OXYCODONE HCL 5 MG/5ML PO SOLN
5.0000 mg | Freq: Once | ORAL | Status: DC | PRN
  Filled 2017-11-07: qty 5

## 2017-11-07 MED ORDER — 0.9 % SODIUM CHLORIDE (POUR BTL) OPTIME
TOPICAL | Status: DC | PRN
  Administered 2017-11-07: 1000 mL

## 2017-11-07 MED ORDER — LACTATED RINGERS IV SOLN
INTRAVENOUS | Status: DC
  Administered 2017-11-07: 07:00:00 via INTRAVENOUS

## 2017-11-07 MED ORDER — PROPOFOL 10 MG/ML IV BOLUS
INTRAVENOUS | Status: DC | PRN
  Administered 2017-11-07: 140 mg via INTRAVENOUS

## 2017-11-07 MED ORDER — BUPIVACAINE HCL 0.25 % IJ SOLN
INTRAMUSCULAR | Status: DC | PRN
  Administered 2017-11-07: 10 mL

## 2017-11-07 MED ORDER — HYDROMORPHONE HCL 1 MG/ML IJ SOLN: 1.0000 mg | INTRAMUSCULAR | Status: DC | PRN

## 2017-11-07 MED ORDER — EPHEDRINE SULFATE-NACL 50-0.9 MG/10ML-% IV SOSY
PREFILLED_SYRINGE | INTRAVENOUS | Status: DC | PRN
  Administered 2017-11-07: 5 mg via INTRAVENOUS

## 2017-11-07 MED ORDER — PROMETHAZINE HCL 25 MG/ML IJ SOLN: 6.2500 mg | INTRAMUSCULAR | Status: DC | PRN

## 2017-11-07 MED ORDER — SUGAMMADEX SODIUM 200 MG/2ML IV SOLN
INTRAVENOUS | Status: DC | PRN
  Administered 2017-11-07: 200 mg via INTRAVENOUS

## 2017-11-07 MED ORDER — ACETAMINOPHEN 650 MG RE SUPP: 650.0000 mg | Freq: Four times a day (QID) | RECTAL | Status: DC | PRN

## 2017-11-07 MED ORDER — HYDROMORPHONE HCL 1 MG/ML IJ SOLN
0.2500 mg | INTRAMUSCULAR | Status: DC | PRN
  Administered 2017-11-07: 0.25 mg via INTRAVENOUS
  Administered 2017-11-07: 0.5 mg via INTRAVENOUS
  Administered 2017-11-07: 0.25 mg via INTRAVENOUS

## 2017-11-07 MED ORDER — LIDOCAINE 2% (20 MG/ML) 5 ML SYRINGE
INTRAMUSCULAR | Status: DC | PRN
  Administered 2017-11-07: 80 mg via INTRAVENOUS

## 2017-11-07 MED ORDER — ROCURONIUM BROMIDE 100 MG/10ML IV SOLN
INTRAVENOUS | Status: AC
  Filled 2017-11-07: qty 1

## 2017-11-07 MED ORDER — FENTANYL CITRATE (PF) 100 MCG/2ML IJ SOLN
INTRAMUSCULAR | Status: DC | PRN
  Administered 2017-11-07: 50 ug via INTRAVENOUS
  Administered 2017-11-07: 100 ug via INTRAVENOUS
  Administered 2017-11-07: 50 ug via INTRAVENOUS

## 2017-11-07 MED ORDER — KCL IN DEXTROSE-NACL 20-5-0.45 MEQ/L-%-% IV SOLN
INTRAVENOUS | Status: AC
  Administered 2017-11-07: 1000 mL
  Filled 2017-11-07: qty 1000

## 2017-11-07 MED ORDER — HYDROMORPHONE HCL 1 MG/ML IJ SOLN
INTRAMUSCULAR | Status: AC
  Administered 2017-11-07: 0.25 mg via INTRAVENOUS
  Filled 2017-11-07: qty 1

## 2017-11-07 MED ORDER — INFLUENZA VAC SPLIT HIGH-DOSE 0.5 ML IM SUSY
0.5000 mL | PREFILLED_SYRINGE | INTRAMUSCULAR | Status: AC
  Administered 2017-11-08: 0.5 mL via INTRAMUSCULAR
  Filled 2017-11-07: qty 0.5

## 2017-11-07 SURGICAL SUPPLY — 33 items
ATTRACTOMAT 16X20 MAGNETIC DRP (DRAPES) ×2 IMPLANT
BLADE SURG 15 STRL LF DISP TIS (BLADE) ×1 IMPLANT
BLADE SURG 15 STRL SS (BLADE) ×1
CHLORAPREP W/TINT 26ML (MISCELLANEOUS) ×2 IMPLANT
CLIP VESOCCLUDE MED 6/CT (CLIP) ×4 IMPLANT
CLIP VESOCCLUDE SM WIDE 6/CT (CLIP) ×4 IMPLANT
COVER SURGICAL LIGHT HANDLE (MISCELLANEOUS) ×2 IMPLANT
COVER WAND RF STERILE (DRAPES) ×2 IMPLANT
DERMABOND ADVANCED (GAUZE/BANDAGES/DRESSINGS) ×1
DERMABOND ADVANCED .7 DNX12 (GAUZE/BANDAGES/DRESSINGS) ×1 IMPLANT
DRAPE LAPAROTOMY T 98X78 PEDS (DRAPES) ×2 IMPLANT
ELECT PENCIL ROCKER SW 15FT (MISCELLANEOUS) ×2 IMPLANT
ELECT REM PT RETURN 15FT ADLT (MISCELLANEOUS) ×2 IMPLANT
GAUZE 4X4 16PLY RFD (DISPOSABLE) ×2 IMPLANT
GOWN STRL REUS W/TWL XL LVL3 (GOWN DISPOSABLE) ×6 IMPLANT
HEMOSTAT SURGICEL 2X4 FIBR (HEMOSTASIS) IMPLANT
ILLUMINATOR WAVEGUIDE N/F (MISCELLANEOUS) IMPLANT
KIT BASIN OR (CUSTOM PROCEDURE TRAY) ×2 IMPLANT
LIGHT WAVEGUIDE WIDE FLAT (MISCELLANEOUS) IMPLANT
NEEDLE HYPO 25X1 1.5 SAFETY (NEEDLE) ×2 IMPLANT
PACK BASIC VI WITH GOWN DISP (CUSTOM PROCEDURE TRAY) ×2 IMPLANT
POWDER SURGICEL 3.0 GRAM (HEMOSTASIS) IMPLANT
SHEARS HARMONIC 9CM CVD (BLADE) ×2 IMPLANT
STRIP CLOSURE SKIN 1/2X4 (GAUZE/BANDAGES/DRESSINGS) IMPLANT
SUT MNCRL AB 4-0 PS2 18 (SUTURE) ×2 IMPLANT
SUT SILK 2 0 (SUTURE)
SUT SILK 2-0 18XBRD TIE 12 (SUTURE) IMPLANT
SUT VIC AB 3-0 SH 18 (SUTURE) ×4 IMPLANT
SYR BULB IRRIGATION 50ML (SYRINGE) ×2 IMPLANT
SYR CONTROL 10ML LL (SYRINGE) ×4 IMPLANT
TOWEL OR 17X26 10 PK STRL BLUE (TOWEL DISPOSABLE) ×2 IMPLANT
TOWEL OR NON WOVEN STRL DISP B (DISPOSABLE) ×2 IMPLANT
YANKAUER SUCT BULB TIP 10FT TU (MISCELLANEOUS) ×2 IMPLANT

## 2017-11-07 NOTE — Anesthesia Preprocedure Evaluation (Signed)
Anesthesia Evaluation  Patient identified by MRN, date of birth, ID band Patient awake    Reviewed: Allergy & Precautions, NPO status , Patient's Chart, lab work & pertinent test results  History of Anesthesia Complications (+) PONV  Airway Mallampati: II  TM Distance: >3 FB Neck ROM: Full    Dental no notable dental hx.    Pulmonary former smoker,    Pulmonary exam normal breath sounds clear to auscultation       Cardiovascular negative cardio ROS Normal cardiovascular exam Rhythm:Regular Rate:Normal     Neuro/Psych negative neurological ROS  negative psych ROS   GI/Hepatic negative GI ROS, Neg liver ROS,   Endo/Other  negative endocrine ROS  Renal/GU negative Renal ROS  negative genitourinary   Musculoskeletal negative musculoskeletal ROS (+)   Abdominal   Peds negative pediatric ROS (+)  Hematology negative hematology ROS (+)   Anesthesia Other Findings   Reproductive/Obstetrics negative OB ROS                             Anesthesia Physical Anesthesia Plan  ASA: II  Anesthesia Plan: General   Post-op Pain Management:    Induction: Intravenous  PONV Risk Score and Plan: 4 or greater and Ondansetron, Dexamethasone, Scopolamine patch - Pre-op and Treatment may vary due to age or medical condition  Airway Management Planned: Oral ETT  Additional Equipment:   Intra-op Plan:   Post-operative Plan: Extubation in OR  Informed Consent: I have reviewed the patients History and Physical, chart, labs and discussed the procedure including the risks, benefits and alternatives for the proposed anesthesia with the patient or authorized representative who has indicated his/her understanding and acceptance.   Dental advisory given  Plan Discussed with: CRNA and Surgeon  Anesthesia Plan Comments:         Anesthesia Quick Evaluation

## 2017-11-07 NOTE — Anesthesia Postprocedure Evaluation (Signed)
Anesthesia Post Note  Patient: Carmelle Bamberg Amorin  Procedure(s) Performed: NECK EXPLORATION WITH LEFT THYROID LOBECTOMY (Left Neck)     Patient location during evaluation: PACU Anesthesia Type: General Level of consciousness: awake and alert Pain management: pain level controlled Vital Signs Assessment: post-procedure vital signs reviewed and stable Respiratory status: spontaneous breathing, nonlabored ventilation, respiratory function stable and patient connected to nasal cannula oxygen Cardiovascular status: blood pressure returned to baseline and stable Postop Assessment: no apparent nausea or vomiting Anesthetic complications: no    Last Vitals:  Vitals:   11/07/17 0945 11/07/17 1000  BP: (!) 156/79 (!) 152/75  Pulse: 89 94  Resp: 17 (!) 21  Temp:    SpO2: 100% 100%    Last Pain:  Vitals:   11/07/17 1000  TempSrc:   PainSc: 6                  Cristle Jared S

## 2017-11-07 NOTE — Anesthesia Procedure Notes (Signed)
Procedure Name: Intubation Date/Time: 11/07/2017 7:41 AM Performed by: Montel Clock, CRNA Pre-anesthesia Checklist: Patient identified, Emergency Drugs available, Suction available, Patient being monitored and Timeout performed Patient Re-evaluated:Patient Re-evaluated prior to induction Oxygen Delivery Method: Circle system utilized Preoxygenation: Pre-oxygenation with 100% oxygen Induction Type: IV induction Ventilation: Mask ventilation without difficulty and Oral airway inserted - appropriate to patient size Laryngoscope Size: Mac and 3 Grade View: Grade I Tube type: Oral Tube size: 7.0 mm Number of attempts: 1 Airway Equipment and Method: Stylet Placement Confirmation: ETT inserted through vocal cords under direct vision,  positive ETCO2 and breath sounds checked- equal and bilateral Secured at: 21 cm Tube secured with: Tape Dental Injury: Teeth and Oropharynx as per pre-operative assessment

## 2017-11-07 NOTE — Op Note (Signed)
NAME: Kayla, Mcgrath MEDICAL RECORD ZO:[id number] ACCOUNT [id number] DATE OF BIRTH:02-Jul-1945 FACILITY: [facility] LOCATION: [facility] [name], MD  OPERATIVE REPORT  DATE OF PROCEDURE:  11/07/2017  PREOPERATIVE DIAGNOSIS:  Primary hyperparathyroidism.  POSTOPERATIVE DIAGNOSIS:  Primary hyperparathyroidism, intrathyroidal parathyroid adenoma.  PROCEDURE: 1.  Neck exploration. 2.  Left thyroid lobectomy.  SURGEON:  Earnstine Regal, MD  ANESTHESIA:  General.  ESTIMATED BLOOD LOSS:  Minimal.  PREPARATION:  ChloraPrep.  COMPLICATIONS:  None.  INDICATIONS:  The patient is a 72 year old female who presents on referral from her primary care physician with signs and symptoms of primary hyperparathyroidism.  Intact PTH level ranged from 156-178.  Calcium levels ranged from 9.5-10.1.  The patient  has osteopenia.  Ultrasound demonstrated a left inferior thyroid nodule.  Nuclear medicine parathyroid scan indicated a left inferior parathyroid adenoma.  The patient now comes to surgery for parathyroidectomy.  Procedure done in OR #1 at the [facility]:  The patient was brought to the operating room and placed in a supine position on the operating room table.  Following administration of general anesthesia, the patient was positioned and then prepped and draped in the usual  aseptic fashion.  After ascertaining that an adequate level of anesthesia had been achieved, a left inferior anterior neck incision was made with a #15 blade.  Dissection was carried through subcutaneous tissues and platysma.  Skin flaps were developed  circumferentially and a Weitlaner retractor placed for exposure.  Strap muscles were incised in the midline and reflected laterally exposing the inferior pole of the left thyroid lobe.  Left lobe appears grossly normal.  Left lobe was gently mobilized.   Dissection was carried inferiorly along the trachea.  Recurrent  laryngeal nerve was identified in the tracheoesophageal groove.  Lateral edge of the esophagus was dissected out.  Dissection was carried cephalad posterior to the thyroid lobe.  Inferior  thyroid artery was identified.  Superior parathyroid gland was identified and was grossly normal.  There was no evidence of enlarged inferior parathyroid gland consistent with adenoma.  The thyrothymic tract was opened and dissected out and there was no  sign of ectopic parathyroid tissue.  Ultrasound and nuclear medicine scans were again reviewed.  It has the appearance that the adenoma is likely intrathyroidal and corresponds to the left inferior thyroid nodule seen on ultrasound examination.  Therefore, decision was made to proceed with  left thyroid lobectomy.  Incision was extended across the midline.  Subplatysmal flaps were developed cephalad and caudad.  A Mahorner self-retaining retractor was placed for exposure.  Midline incision was extended cephalad and caudad.  Strap muscles were again reflected  laterally exposing the left thyroid lobe.  Left lobe was mobilized.  The middle thyroid vein was divided between small ligaclips.  Superior pole vessels were dissected out and divided between medium Ligaclips with the Harmonic scalpel.  The gland was  rolled further anteriorly.  Superior parathyroid gland was preserved.  Recurrent laryngeal nerve was preserved along its course.  Ligament of Gwenlyn Found was dissected out.  Vascular structures are divided between small Ligaclips and the ligament of Gwenlyn Found was  released with the electrocautery.  The gland was mobilized up and onto the anterior trachea.  There was no pyramidal lobe.  The isthmus was mobilized across the midline.  The thyroid parenchyma was divided at the junction of the isthmus and right thyroid  lobe using the Harmonic scalpel.  Left lobe was then sectioned on the operating  room table with a #15 blade.  A 7 or 8 mm discrete nodule was identified in the  inferior pole of the left thyroid lobe.  This has the appearance of a parathyroid adenoma.  It was marked with a suture.  The  entire left lobe was submitted to pathology where frozen section confirmed hypercellular parathyroid tissue consistent with an intrathyroidal parathyroid adenoma.  Neck was irrigated with warm saline.  Good hemostasis was noted throughout the operative field.  Fibrillar was placed throughout the operative field.  Strap muscles were reapproximated in the midline with interrupted 3-0 Vicryl sutures.  Platysma was  closed with interrupted 3-0 Vicryl sutures.  Skin was anesthetized with local anesthetic.  Skin edges were closed with a running 4-0 Monocryl subcuticular suture.  Dermabond was placed as dressing.  The patient is awakened from anesthesia and brought to  the recovery room.  The patient tolerated the procedure.  [name], [place] [facility] Office: [phone]    TN/NUANCE  D:11/07/2017 T:11/07/2017 JOB:[id number]

## 2017-11-07 NOTE — Interval H&P Note (Signed)
History and Physical Interval Note:  11/07/2017 7:15 AM  Kayla Mcgrath  has presented today for surgery, with the diagnosis of primary hyperparathyroidism.  The various methods of treatment have been discussed with the patient and family. After consideration of risks, benefits and other options for treatment, the patient has consented to    Procedure(s): LEFT INFERIOR PARATHYROIDECTOMY (Left) as a surgical intervention .    The patient's history has been reviewed, patient examined, no change in status, stable for surgery.  I have reviewed the patient's chart and labs.  Questions were answered to the patient's satisfaction.    [name], [facility] Office: [place]

## 2017-11-07 NOTE — Transfer of Care (Signed)
Immediate Anesthesia Transfer of Care Note  Patient: Kayla Mcgrath  Procedure(s) Performed: NECK EXPLORATION WITH LEFT THYROID LOBECTOMY (Left Neck)  Patient Location: PACU  Anesthesia Type:General  Level of Consciousness: drowsy and patient cooperative  Airway & Oxygen Therapy: Patient Spontanous Breathing and Patient connected to face mask oxygen  Post-op Assessment: Report given to RN and Post -op Vital signs reviewed and stable  Post vital signs: Reviewed and stable  Last Vitals:  Vitals Value Taken Time  BP 154/72 11/07/2017  9:15 AM  Temp    Pulse 97 11/07/2017  9:17 AM  Resp 21 11/07/2017  9:17 AM  SpO2 100 % 11/07/2017  9:17 AM  Vitals shown include unvalidated device data.  Last Pain:  Vitals:   11/07/17 0552  TempSrc:   PainSc: 0-No pain         Complications: No apparent anesthesia complications

## 2017-11-07 NOTE — Brief Op Note (Signed)
11/07/2017  9:13 AM  PATIENT:  Kayla Mcgrath  72 y.o. female  PRE-OPERATIVE DIAGNOSIS:  primary hyperparathyroidism  POST-OPERATIVE DIAGNOSIS:  intrathyroidal parathyroid adenoma  PROCEDURE:  Procedure(s): NECK EXPLORATION WITH LEFT THYROID LOBECTOMY (Left)  SURGEON:  Surgeon(s) and Role:    * [name], MD - Primary  ANESTHESIA:   general  EBL:  minimal  BLOOD ADMINISTERED:none  DRAINS: none   LOCAL MEDICATIONS USED:  MARCAINE     SPECIMEN:  Excision  DISPOSITION OF SPECIMEN:  PATHOLOGY  COUNTS:  YES  TOURNIQUET:  * No tourniquets in log *  DICTATION: .Other Dictation: Dictation Number [id number]  PLAN OF CARE: Admit for overnight observation  PATIENT DISPOSITION:  PACU - hemodynamically stable.   Delay start of Pharmacological VTE agent (>24hrs) due to surgical blood loss or risk of bleeding: yes  [name], MD [facility] Office: [phone]

## 2017-11-08 ENCOUNTER — Encounter (HOSPITAL_COMMUNITY): Payer: Self-pay | Admitting: Surgery

## 2017-11-08 DIAGNOSIS — Z79899 Other long term (current) drug therapy: Secondary | ICD-10-CM | POA: Diagnosis not present

## 2017-11-08 DIAGNOSIS — E214 Other specified disorders of parathyroid gland: Secondary | ICD-10-CM | POA: Diagnosis not present

## 2017-11-08 DIAGNOSIS — Z23 Encounter for immunization: Secondary | ICD-10-CM | POA: Diagnosis not present

## 2017-11-08 DIAGNOSIS — E7800 Pure hypercholesterolemia, unspecified: Secondary | ICD-10-CM | POA: Diagnosis not present

## 2017-11-08 DIAGNOSIS — Z87891 Personal history of nicotine dependence: Secondary | ICD-10-CM | POA: Diagnosis not present

## 2017-11-08 DIAGNOSIS — E210 Primary hyperparathyroidism: Secondary | ICD-10-CM | POA: Diagnosis not present

## 2017-11-08 LAB — BASIC METABOLIC PANEL
Anion gap: 5 (ref 5–15)
BUN: 12 mg/dL (ref 8–23)
CHLORIDE: 105 mmol/L (ref 98–111)
CO2: 26 mmol/L (ref 22–32)
CREATININE: 0.65 mg/dL (ref 0.44–1.00)
Calcium: 8.8 mg/dL — ABNORMAL LOW (ref 8.9–10.3)
GFR calc Af Amer: >60 mL/min (ref >60)
GLUCOSE: 128 mg/dL — AB (ref 70–99)
POTASSIUM: 3.8 mmol/L (ref 3.5–5.1)
SODIUM: 136 mmol/L (ref 135–145)

## 2017-11-08 MED ORDER — ONDANSETRON 4 MG PO TBDP: 4.0000 mg | ORAL_TABLET | Freq: Four times a day (QID) | ORAL | 0 refills | Status: DC | PRN

## 2017-11-08 NOTE — Progress Notes (Signed)
Pt was discharged home today. Instructions were reviewed with patient, and questions were answered. Pt was taken to main entrance via wheelchair by NT.  

## 2017-11-08 NOTE — Discharge Summary (Signed)
Physician Discharge Summary [facility]  Patient ID: MAUI AHART MRN: [id number] DOB/AGE: 09/05/1945 72 y.o.  Admit date: 11/07/2017 Discharge date: 11/08/2017  Admission Diagnoses:  Primary hyperparathyroidism  Discharge Diagnoses:  Principal Problem:   Hyperparathyroidism [facility] Active Problems:   Primary hyperparathyroidism [facility])   Discharged Condition: good  Hospital Course: Patient was admitted for observation following parathyroid and thyroid surgery.  Post op course was uncomplicated.  Pain was well controlled.  Tolerated diet.  Post op calcium level on morning following surgery was 8.8 mg/dl.  Patient was prepared for discharge home on POD#1.  Consults: None  Treatments: surgery: left thyroid lobectomy for intra-thyroidal parathyroid adenoma  Discharge Exam: Blood pressure (!) 109/58, pulse 79, temperature 98.8 F (37.1 C), temperature source Oral, resp. rate 16, height 5' (1.524 m), weight 74.9 kg, SpO2 96 %. HEENT - clear Neck - wound dry and intact, mild STS, voice normal Chest - clear bilaterally Cor - RRR  Disposition: Home  Discharge Instructions    Diet - low sodium heart healthy   Complete by:  As directed    Discharge instructions   Complete by:  As directed    [facility]  THYROID & PARATHYROID SURGERY:  POST-OP INSTRUCTIONS  Always review your discharge instruction sheet from the facility where your surgery was performed.  A prescription for pain medication may be given to you upon discharge.  Take your pain medication as prescribed.  If narcotic pain medicine is not needed, then you may take acetaminophen (Tylenol) or ibuprofen (Advil) as needed.  Take your usually prescribed medications unless otherwise directed.  If you need a refill on your pain medication, please contact our office during regular business hours.  Prescriptions cannot be processed by our office after 5 pm or on weekends.  Start  with a light diet upon arrival home, such as soup and crackers or toast.  Be sure to drink plenty of fluids daily.  Resume your normal diet the day after surgery.  Most patients will experience some swelling and bruising on the chest and neck area.  Ice packs will help.  Swelling and bruising can take several days to resolve.   It is common to experience some constipation after surgery.  Increasing fluid intake and taking a stool softener (Colace) will usually help or prevent this problem.  A mild laxative (Milk of Magnesia or Miralax) should be taken according to package directions if there has been no bowel movement after 48 hours.  You have steri-strips and a gauze dressing over your incision.  You may remove the gauze bandage on the second day after surgery, and you may shower at that time.  Leave your steri-strips (small skin tapes) in place directly over the incision.  These strips should remain on the skin for 5-7 days and then be removed.  You may get them wet in the shower and pat them dry.  You may resume regular (light) daily activities beginning the next day (such as daily self-care, walking, climbing stairs) gradually increasing activities as tolerated.  You may have sexual intercourse when it is comfortable.  Refrain from any heavy lifting or straining until approved by your doctor.  You may drive when you no longer are taking prescription pain medication, you can comfortably wear a seatbelt, and you can safely maneuver your car and apply brakes.  You should see your doctor in the office for a follow-up appointment approximately three weeks after your surgery.  Make  sure that you call for this appointment within a day or two after you arrive home to insure a convenient appointment time.  WHEN TO CALL YOUR DOCTOR: -- Fever greater than 101.5 -- Inability to urinate -- Nausea and/or vomiting - persistent -- Extreme swelling or bruising -- Continued bleeding from incision -- Increased  pain, redness, or drainage from the incision -- Difficulty swallowing or breathing -- Muscle cramping or spasms -- Numbness or tingling in hands or around lips  The clinic staff is available to answer your questions during regular business hours.  Please don't hesitate to call and ask to speak to one of the nurses if you have concerns.  [name], MD [facility] Office: [phone]   Increase activity slowly   Complete by:  As directed    No dressing needed   Complete by:  As directed      Allergies as of 11/08/2017      Reactions   Latex Rash      Medication List    TAKE these medications   acetaminophen 500 MG tablet Commonly known as:  TYLENOL Take 1,000 mg by mouth every 6 (six) hours as needed for mild pain or moderate pain.   ezetimibe 10 MG tablet Commonly known as:  ZETIA TAKE 1 TABLET BY MOUTH EVERY DAY   ketoconazole 2 % cream Commonly known as:  NIZORAL Apply 1 application topically daily as needed for irritation.   ondansetron 4 MG disintegrating tablet Commonly known as:  ZOFRAN-ODT Take 1 tablet (4 mg total) by mouth every 6 (six) hours as needed for nausea or vomiting.   traMADol 50 MG tablet Commonly known as:  ULTRAM Take 1 tablet (50 mg total) by mouth every 6 (six) hours as needed. What changed:  reasons to take this   Vitamin D 2000 units Caps Take one by mouth daily      Follow-up Information    [name], MD. Schedule an appointment as soon as possible for a visit in 3 week(s).   Specialty:  General Surgery Contact information: [address] [place] [address] [phone]           Earnstine Regal, MD, [facility] Office: [phone]   Signed: Earnstine Regal 11/08/2017, 5:17 PM

## 2017-11-24 DIAGNOSIS — D562 Delta-beta thalassemia: Secondary | ICD-10-CM | POA: Diagnosis not present

## 2017-12-05 ENCOUNTER — Other Ambulatory Visit: Payer: Self-pay | Admitting: *Deleted

## 2017-12-05 MED ORDER — EZETIMIBE 10 MG PO TABS: 10.0000 mg | ORAL_TABLET | Freq: Every day | ORAL | 0 refills | Status: DC

## 2018-02-05 ENCOUNTER — Other Ambulatory Visit: Payer: Self-pay | Admitting: Family Medicine

## 2018-02-05 NOTE — Telephone Encounter (Signed)
TA-Plz advise if you are in agreement with this/I do not see a Lipid Panel that is recent/Would you like for me to send in a 90d to the mail order and make sure she is scheduled within those 90d to see you? Plz advise/thx dmf

## 2018-02-05 NOTE — Telephone Encounter (Signed)
Yes that sounds perfect.  Thank you.

## 2018-02-05 NOTE — Telephone Encounter (Signed)
Patient walked in to office requesting a refill on Zetia 10mg  to go to [facility].

## 2018-02-16 ENCOUNTER — Other Ambulatory Visit: Payer: Self-pay

## 2018-02-16 ENCOUNTER — Encounter: Payer: Self-pay | Admitting: Family Medicine

## 2018-02-16 MED ORDER — EZETIMIBE 10 MG PO TABS: 10.0000 mg | ORAL_TABLET | Freq: Every day | ORAL | 1 refills | Status: DC

## 2018-03-07 DIAGNOSIS — H5213 Myopia, bilateral: Secondary | ICD-10-CM | POA: Diagnosis not present

## 2018-04-02 ENCOUNTER — Encounter: Payer: Self-pay | Admitting: Family Medicine

## 2018-04-02 ENCOUNTER — Other Ambulatory Visit: Payer: Self-pay | Admitting: Family Medicine

## 2018-04-02 DIAGNOSIS — Z1231 Encounter for screening mammogram for malignant neoplasm of breast: Secondary | ICD-10-CM

## 2018-05-18 ENCOUNTER — Ambulatory Visit: Payer: Medicare HMO

## 2018-07-09 ENCOUNTER — Other Ambulatory Visit: Payer: Self-pay

## 2018-07-09 ENCOUNTER — Ambulatory Visit
Admission: RE | Admit: 2018-07-09 | Discharge: 2018-07-09 | Disposition: A | Discharge status: Home / Self Care | Payer: Medicare HMO | Source: Ambulatory Visit | Attending: Family Medicine | Admitting: Family Medicine

## 2018-07-09 DIAGNOSIS — Z1231 Encounter for screening mammogram for malignant neoplasm of breast: Secondary | ICD-10-CM

## 2018-08-01 ENCOUNTER — Other Ambulatory Visit: Payer: Self-pay | Admitting: Family Medicine

## 2018-08-08 ENCOUNTER — Encounter: Payer: Self-pay | Admitting: Gastroenterology

## 2018-09-25 ENCOUNTER — Other Ambulatory Visit: Payer: Self-pay

## 2018-09-25 ENCOUNTER — Ambulatory Visit (INDEPENDENT_AMBULATORY_CARE_PROVIDER_SITE_OTHER): Payer: Medicare HMO | Admitting: Behavioral Health

## 2018-09-25 ENCOUNTER — Ambulatory Visit: Payer: Medicare HMO | Admitting: Family Medicine

## 2018-09-25 DIAGNOSIS — Z23 Encounter for immunization: Secondary | ICD-10-CM

## 2018-09-25 NOTE — Progress Notes (Signed)
Patient presents in clinic today for Influenza vaccination. IM injection was given in the right deltoid. Patient tolerated the injection well. No signs or symptoms of a reaction were noted prior to patient leaving the nurse visit.

## 2018-09-26 IMAGING — NM NM PARATHYROID W/ SPECT
5 series · 20 of 20 positions shown · non-contrast
Comparison: None

CLINICAL DATA: Hypercalcemia, elevated parathormone, parathormone =
161 on 08/07/2017

EXAM:
NM PARATHYROID SCINTIGRAPHY AND SPECT IMAGING
TECHNIQUE: Following intravenous administration of radiopharmaceutical, early
and 2-hour delayed planar images were obtained in the anterior
projection. Delayed triplanar SPECT images were also obtained at 2
hours.
RADIOPHARMACEUTICALS:  24.0 mCi Sc-VVm Sestamibi IV

[Series 1: spect - (id)_(id)_tra · 4.1mm · 4.14mm/px · 6 of 128 frames shown]
[frame 11/128]
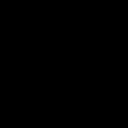
[frame 32/128]
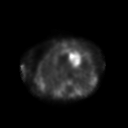
[frame 54/128]
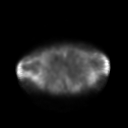
[frame 75/128]
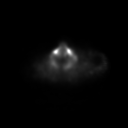
[frame 96/128]
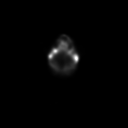
[frame 118/128]
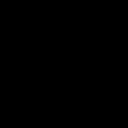

[Series 1: spect - (id)_(id)_cor · 4.1mm · 4.14mm/px · 6 of 128 frames shown]
[frame 11/128]
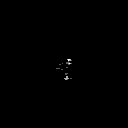
[frame 32/128]
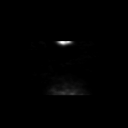
[frame 54/128]
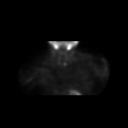
[frame 75/128]
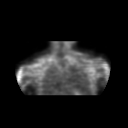
[frame 96/128]
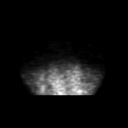
[frame 118/128]
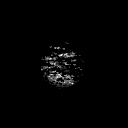

[Series 1: 15 min ant · 4.14mm/px · 1 of 1 slices shown]
[im 1/1]
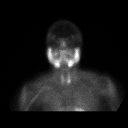

[Series 1: 2 hr ant · 2.07mm/px · 1 of 1 slices shown]
[im 1/1]
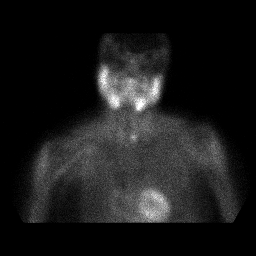

[Series 2: spect parathyroid · 4.14mm/px · 6 of 64 frames shown]
[frame 6/64]
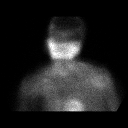
[frame 16/64]
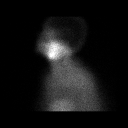
[frame 27/64]
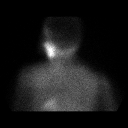
[frame 38/64]
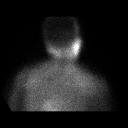
[frame 48/64]
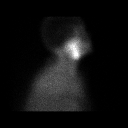
[frame 59/64]
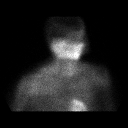

[20 of 20 positions shown; findings below may reference images not displayed]

FINDINGS: Planar imaging: Initial distribution of sestamibi is homogeneous
throughout both thyroid lobes with a single focal area of increased
tracer accumulation at the inferior pole of the LEFT lobe. Delayed
images demonstrate washout of tracer from thyroid tissue with
persistent abnormal sestamibi retention at the inferior pole of the
LEFT thyroid lobe.

SPECT imaging: Abnormal retained sestamibi identified at the
expected position of the LEFT inferior parathyroid gland consistent
with a parathyroid adenoma. No abnormal sestamibi retention at the
expected positions of the remaining parathyroid glands. No ectopic
localization of tracer within the mediastinum.
IMPRESSION: Abnormal sestamibi retention at the LEFT inferior parathyroid gland
consistent with a parathyroid adenoma.

## 2018-10-10 ENCOUNTER — Ambulatory Visit (INDEPENDENT_AMBULATORY_CARE_PROVIDER_SITE_OTHER): Payer: Medicare HMO | Admitting: Family Medicine

## 2018-10-10 ENCOUNTER — Other Ambulatory Visit: Payer: Self-pay

## 2018-10-10 ENCOUNTER — Encounter: Payer: Self-pay | Admitting: Family Medicine

## 2018-10-10 VITALS — BP 100/62 | HR 81 | Temp 98.8°F | Ht 60.0 in | Wt 164.5 lb

## 2018-10-10 DIAGNOSIS — M1711 Unilateral primary osteoarthritis, right knee: Secondary | ICD-10-CM | POA: Diagnosis not present

## 2018-10-10 MED ORDER — METHYLPREDNISOLONE ACETATE 40 MG/ML IJ SUSP
80.0000 mg | Freq: Once | INTRAMUSCULAR | Status: AC
  Administered 2018-10-10: 80 mg via INTRA_ARTICULAR

## 2018-10-10 MED ORDER — DICLOFENAC SODIUM 1 % TD GEL: 4.0000 g | Freq: Four times a day (QID) | TRANSDERMAL | 5 refills | Status: DC

## 2018-10-10 NOTE — Progress Notes (Signed)
Kayla Donaldson T. Kayla Holle, MD Primary Care and [name], [address] Phone: [phone]  FAX: [phone]  Kayla Mcgrath - 73 y.o. female  MRN [id number]  Date of Birth: 08-Nov-1945  Visit Date: 10/10/2018  PCP: [name], MD  Referred by: [name], MD  Chief Complaint  Patient presents with  . Knee Pain    Right   Subjective:   Kayla Mcgrath is a 73 y.o. very pleasant female patient with Body mass index is 32.13 kg/m. who presents with the following:  She is here for evaluation of right-sided knee pain.  I looked at her plain films from 3 years ago, and even at that point she had end-stage degenerative joint disease with particularly breakdown at the medial compartment.  She is here for advice.  Has been using a cane for while.   Better this morning and will take some tylenol.  Grandchildren coming next week.  Voltaren gel Curcumin Tart cherry juice concentrate  Knee inj, R  Past Medical History, Surgical History, Social History, Family History, Problem List, Medications, and Allergies have been reviewed and updated if relevant.  Patient Active Problem List   Diagnosis Date Noted  . Primary hyperparathyroidism ([place]) 11/07/2017  . Hyperparathyroidism ([place]) 07/25/2017  . Hyperglycemia 07/11/2017  . Serum calcium elevated 07/11/2017  . Leukocytosis 07/11/2017  . Arthralgia 06/29/2015  . Osteopenia determined by x-ray 01/27/2014  . Insomnia 05/30/2013  . HLD (hyperlipidemia) 05/07/2012  . DNR (do not resuscitate) 05/07/2012  . Primary cancer of upper inner quadrant of right female breast ([place]) 04/27/2012    Past Medical History:  Diagnosis Date  . Breast cancer ([place])   . Colon polyps    ?  Marland Kitchen Complication of anesthesia   . DCIS (ductal carcinoma in situ) of breast 2010  . Diverticulosis   . Full dentures   . Hyperlipidemia   . Joint pain   . PONV (postoperative nausea  and vomiting)   . Skin cancer   . Wears glasses     Past Surgical History:  Procedure Laterality Date  . BREAST BIOPSY Right 05/11/2012  . BREAST BIOPSY Right 04/24/2012  . BREAST LUMPECTOMY Right december 2010   cancer-tamoxifen  . BREAST RECONSTRUCTION WITH PLACEMENT OF TISSUE EXPANDER AND FLEX HD (ACELLULAR HYDRATED DERMIS) Right 06/20/2012   Procedure: BREAST RECONSTRUCTION WITH PLACEMENT OF TISSUE EXPANDER AND FLEX HD (ACELLULAR HYDRATED DERMIS);  Surgeon: Theodoro Kos, DO;  Location: [place];  Service: Plastics;  Laterality: Right;  . BREAST REDUCTION WITH MASTOPEXY Left 10/11/2012   Procedure: LEFT BREAST REDUCTION WITH MASTOPEXY;  Surgeon: Theodoro Kos, DO;  Location: [place];  Service: Plastics;  Laterality: Left;  . COLONOSCOPY    . DILATION AND CURETTAGE OF UTERUS    . LIPOSUCTION WITH LIPOFILLING Left 10/11/2012   Procedure: LIPOSUCTION WITH LIPOFILLING;  Surgeon: Theodoro Kos, DO;  Location: [place];  Service: Plastics;  Laterality: Left;  Marland Kitchen MASTECTOMY Right 06/20/2012  . MASTECTOMY W/ SENTINEL NODE BIOPSY Right 06/20/2012   Procedure: right skin sparing MASTECTOMY WITH SENTINEL LYMPH NODE BIOPSY;  Surgeon: Stark Klein, MD;  Location: [place];  Service: General;  Laterality: Right;  . PARATHYROIDECTOMY Left 11/07/2017   Procedure: NECK EXPLORATION WITH LEFT THYROID LOBECTOMY;  Surgeon: [name], MD;  Location: [facility];  Service: General;  Laterality: Left;  . REDUCTION MAMMAPLASTY Left 10/2012  . REMOVAL  OF TISSUE EXPANDER AND PLACEMENT OF IMPLANT Right 10/11/2012   Procedure: REMOVAL OF TISSUE EXPANDER AND PLACEMENT OF SILICONE IMPLANT RIGHT;  Surgeon: Theodoro Kos, DO;  Location: [place];  Service: Plastics;  Laterality: Right;    Social History   Socioeconomic History  . Marital status: Married    Spouse name: Not on file  . Number of children: 2  . Years of education: Not  on file  . Highest education level: Not on file  Occupational History  . Occupation: retired Corporate treasurer  Social Needs  . Financial resource strain: Not on file  . Food insecurity    Worry: Not on file    Inability: Not on file  . Transportation needs    Medical: Not on file    Non-medical: Not on file  Tobacco Use  . Smoking status: Former Smoker    Types: Cigarettes    Quit date: 06/14/1981    Years since quitting: 37.3  . Smokeless tobacco: Never Used  Substance and Sexual Activity  . Alcohol use: No  . Drug use: No  . Sexual activity: Yes    Birth control/protection: Post-menopausal  Lifestyle  . Physical activity    Days per week: Not on file    Minutes per session: Not on file  . Stress: Not on file  Relationships  . Social Herbalist on phone: Not on file    Gets together: Not on file    Attends religious service: Not on file    Active member of club or organization: Not on file    Attends meetings of clubs or organizations: Not on file    Relationship status: Not on file  . Intimate partner violence    Fear of current or ex partner: Not on file    Emotionally abused: Not on file    Physically abused: Not on file    Forced sexual activity: Not on file  Other Topics Concern  . Not on file  Social History Narrative   Recently moved from [place] to be closer to her two daughters and grandchildren.   Retired Corporate treasurer.   DNR- forms singed on 05/07/2012 and returned to pt.    Family History  Problem Relation Age of Onset  . Alcohol abuse Mother   . Colon polyps Mother   . Mental illness Mother   . Alcohol abuse Father        suicide  . Mental illness Father   . Heart disease Paternal Grandfather     Allergies  Allergen Reactions  . Latex Rash    Medication list reviewed and updated in full in [place].  GEN: No fevers, chills. Nontoxic. Primarily MSK c/o today. MSK: Detailed in the HPI GI: tolerating PO intake without difficulty Neuro: No numbness,  parasthesias, or tingling associated. Otherwise the pertinent positives of the ROS are noted above.   Objective:   BP 100/62   Pulse 81   Temp 98.8 F (37.1 C) (Temporal)   Ht 5' (1.524 m)   Wt 164 lb 8 oz (74.6 kg)   SpO2 95%   BMI 32.13 kg/m    GEN: WDWN, NAD, Non-toxic, Alert & Oriented x 3 HEENT: Atraumatic, Normocephalic.  Ears and Nose: No external deformity. EXTR: No clubbing/cyanosis/edema NEURO: Normal gait.  PSYCH: Normally interactive. Conversant. Not depressed or anxious appearing.  Calm demeanor.    Full extension, flexion to 120 degrees.  Good patellar motion.  Pain on the medial compartment and  to much less on the lateral joint line.  Bounce home test, McMurray's test and flexion pinch test are all negative.  All ligamentous structures are normal.  Intact.  Radiology: No results found.  Assessment and Plan:     ICD-10-CM   1. Primary osteoarthritis of right knee  M17.11 methylPREDNISolone acetate (DEPO-MEDROL) injection 80 mg   Arthritis flareup, I gave her various supplements that she can try, and I am also going to inject her knee today.  Aspiration/Injection Procedure Note DERENDA GIDDINGS 1946-01-09 Date of procedure: 10/10/2018  Procedure: Large Joint Aspiration / Injection of Knee R Indications: Pain  Procedure Details Patient verbally consented to procedure. Risks (including potential rare risk of infection), benefits, and alternatives explained. Sterilely prepped with Chloraprep. Ethyl cholride used for anesthesia. 8 cc Lidocaine 1% mixed with 2 mL Depo-Medrol 40 mg injected using the anteromedial approach without difficulty. No complications with procedure and tolerated well. Patient had decreased pain post-injection. Medication: 2 mL of Depo-Medrol 40 mg, equaling Depo-Medrol 80 mg total   Patient Instructions  OSTEOARTHRITIS:  For symptomatic relief:  Tylenol: 2 tablets up to 3-4 times a day  Topical Capzaicin Cream, as needed (wear glove  to put on) - THIS IS EXCEPTIONALLY HOT Supplements: Tart cherry juice and Curcumin (Turmeric extract) have good scientific evidence  For flares, corticosteroid injections help. Hyaluronic Acid injections have good success, average relief is 6 months  Glucosamine and Chondroitin often helpful - will take about 3 months to see if you have an effect. If you do, great, keep them up, if none at that point, no need to take in the future.  Omega-3 fish oils may help, 2 grams daily  Ice joints on bad days, 20 min, 2-3 x / day REGULAR EXERCISE: swimming, Yoga, Tai Chi, Pilates, bicycle (NON-IMPACT activity)   Weight loss will always take stress off of the joints and back     Follow-up: No follow-ups on file.  Meds ordered this encounter  Medications  . diclofenac sodium (VOLTAREN) 1 % GEL    Sig: Apply 4 g topically 4 (four) times daily.    Dispense:  500 g    Refill:  5  . methylPREDNISolone acetate (DEPO-MEDROL) injection 80 mg   No orders of the defined types were placed in this encounter.   Signed,  Maud Deed. Elvan Ebron, MD   Outpatient Encounter Medications as of 10/10/2018  Medication Sig  . acetaminophen (TYLENOL) 500 MG tablet Take 1,000 mg by mouth every 6 (six) hours as needed for mild pain or moderate pain.  . Cholecalciferol (VITAMIN D) 2000 UNITS CAPS Take one by mouth daily  . ezetimibe (ZETIA) 10 MG tablet Take 1 tablet (10 mg total) by mouth daily. See PCP for more refills  . ketoconazole (NIZORAL) 2 % cream Apply 1 application topically daily as needed for irritation.   . traMADol (ULTRAM) 50 MG tablet Take 1 tablet (50 mg total) by mouth every 6 (six) hours as needed. (Patient taking differently: Take 50 mg by mouth every 6 (six) hours as needed for moderate pain. )  . diclofenac sodium (VOLTAREN) 1 % GEL Apply 4 g topically 4 (four) times daily.  . [DISCONTINUED] ondansetron (ZOFRAN-ODT) 4 MG disintegrating tablet Take 1 tablet (4 mg total) by mouth every 6 (six) hours  as needed for nausea or vomiting.  . [EXPIRED] methylPREDNISolone acetate (DEPO-MEDROL) injection 80 mg    No facility-administered encounter medications on file as of 10/10/2018.

## 2018-10-10 NOTE — Patient Instructions (Signed)
OSTEOARTHRITIS:  For symptomatic relief:  Tylenol: 2 tablets up to 3-4 times a day  Topical Capzaicin Cream, as needed (wear glove to put on) - THIS IS EXCEPTIONALLY HOT Supplements: Tart cherry juice and Curcumin (Turmeric extract) have good scientific evidence  For flares, corticosteroid injections help. Hyaluronic Acid injections have good success, average relief is 6 months  Glucosamine and Chondroitin often helpful - will take about 3 months to see if you have an effect. If you do, great, keep them up, if none at that point, no need to take in the future.  Omega-3 fish oils may help, 2 grams daily  Ice joints on bad days, 20 min, 2-3 x / day REGULAR EXERCISE: swimming, Yoga, Tai Chi, Pilates, bicycle (NON-IMPACT activity)   Weight loss will always take stress off of the joints and back  

## 2018-10-15 ENCOUNTER — Encounter: Payer: Self-pay | Admitting: Family Medicine

## 2018-10-16 ENCOUNTER — Other Ambulatory Visit: Payer: Self-pay | Admitting: Family Medicine

## 2018-11-06 NOTE — Progress Notes (Signed)
Virtual Visit via Video Note  I connected with patient on 11/07/18 at 11:45 AM EDT by audio enabled telemedicine application and verified that I am speaking with the correct person using two identifiers.   THIS ENCOUNTER IS A VIRTUAL VISIT DUE TO COVID-19 - PATIENT WAS NOT SEEN IN THE OFFICE. PATIENT HAS CONSENTED TO VIRTUAL VISIT / TELEMEDICINE VISIT   Location of patient: home  Location of provider: office  I discussed the limitations of evaluation and management by telemedicine and the availability of in person appointments. The patient expressed understanding and agreed to proceed.   Subjective:   Kayla Mcgrath is a 73 y.o. female who presents for Medicare Annual (Subsequent) preventive examination.  Review of Systems:   Home Safety/Smoke Alarms: Feels safe in home. Smoke alarms in place.  Lives w/ husband and pets in 1 story home. 2 dogs/ 2 cats.   Female:    Mammo- 07/10/18      Dexa scan-  ordered      CCS- pt states she will schedule.  Objective:     Vitals: Unable to assess. This visit is enabled though telemedicine due to Covid 19.  Advanced Directives 11/07/2018 11/07/2017 11/07/2017 10/30/2017 06/21/2017 04/05/2016 10/29/2015  Does Patient Have a Medical Advance Directive? Yes Yes Yes Yes Yes Yes No  Type of Paramedic of [place];Living will Healthcare Power of [place] of [place] of [place];Living will Living will -  Does patient want to make changes to medical advance directive? No - Patient declined - No - Patient declined - - - -  Copy of [place] in Chart? No - copy requested No - copy requested No - copy requested No - copy requested No - copy requested - -  Would patient like information on creating a medical advance directive? - - - - - - No - patient declined information  Pre-existing out of facility DNR order (yellow form or pink MOST form) - - - - -  - -    Tobacco Social History   Tobacco Use  Smoking Status Former Smoker  . Types: Cigarettes  . Quit date: 06/14/1981  . Years since quitting: 37.4  Smokeless Tobacco Never Used     Counseling given: Not Answered   Clinical Intake:     Pain : No/denies pain     Past Medical History:  Diagnosis Date  . Breast cancer ([place])   . Colon polyps    ?  Marland Kitchen Complication of anesthesia   . DCIS (ductal carcinoma in situ) of breast 2010  . Diverticulosis   . Full dentures   . Hyperlipidemia   . Joint pain   . PONV (postoperative nausea and vomiting)   . Skin cancer   . Wears glasses    Past Surgical History:  Procedure Laterality Date  . BREAST BIOPSY Right 05/11/2012  . BREAST BIOPSY Right 04/24/2012  . BREAST LUMPECTOMY Right december 2010   cancer-tamoxifen  . BREAST RECONSTRUCTION WITH PLACEMENT OF TISSUE EXPANDER AND FLEX HD (ACELLULAR HYDRATED DERMIS) Right 06/20/2012   Procedure: BREAST RECONSTRUCTION WITH PLACEMENT OF TISSUE EXPANDER AND FLEX HD (ACELLULAR HYDRATED DERMIS);  Surgeon: Theodoro Kos, DO;  Location: [facility];  Service: Plastics;  Laterality: Right;  . BREAST REDUCTION WITH MASTOPEXY Left 10/11/2012   Procedure: LEFT BREAST REDUCTION WITH MASTOPEXY;  Surgeon: Theodoro Kos, DO;  Location: [facility];  Service: Plastics;  Laterality: Left;  . COLONOSCOPY    .  DILATION AND CURETTAGE OF UTERUS    . LIPOSUCTION WITH LIPOFILLING Left 10/11/2012   Procedure: LIPOSUCTION WITH LIPOFILLING;  Surgeon: Theodoro Kos, DO;  Location: [facility];  Service: Plastics;  Laterality: Left;  Marland Kitchen MASTECTOMY Right 06/20/2012  . MASTECTOMY W/ SENTINEL NODE BIOPSY Right 06/20/2012   Procedure: right skin sparing MASTECTOMY WITH SENTINEL LYMPH NODE BIOPSY;  Surgeon: Stark Klein, MD;  Location: [facility];  Service: General;  Laterality: Right;  . PARATHYROIDECTOMY Left 11/07/2017   Procedure: NECK EXPLORATION WITH LEFT  THYROID LOBECTOMY;  Surgeon: [name], MD;  [facility];  Service: General;  Laterality: Left;  . REDUCTION MAMMAPLASTY Left 10/2012  . REMOVAL OF TISSUE EXPANDER AND PLACEMENT OF IMPLANT Right 10/11/2012   Procedure: REMOVAL OF TISSUE EXPANDER AND PLACEMENT OF SILICONE IMPLANT RIGHT;  Surgeon: Theodoro Kos, DO;  Location: [facility];  Service: Plastics;  Laterality: Right;   Family History  Problem Relation Age of Onset  . Alcohol abuse Mother   . Colon polyps Mother   . Mental illness Mother   . Alcohol abuse Father        suicide  . Mental illness Father   . Heart disease Paternal Grandfather    Social History   Socioeconomic History  . Marital status: Married    Spouse name: Not on file  . Number of children: 2  . Years of education: Not on file  . Highest education level: Not on file  Occupational History  . Occupation: retired Corporate treasurer  Social Needs  . Financial resource strain: Not on file  . Food insecurity    Worry: Not on file    Inability: Not on file  . Transportation needs    Medical: Not on file    Non-medical: Not on file  Tobacco Use  . Smoking status: Former Smoker    Types: Cigarettes    Quit date: 06/14/1981    Years since quitting: 37.4  . Smokeless tobacco: Never Used  Substance and Sexual Activity  . Alcohol use: No  . Drug use: No  . Sexual activity: Yes    Birth control/protection: Post-menopausal  Lifestyle  . Physical activity    Days per week: Not on file    Minutes per session: Not on file  . Stress: Not on file  Relationships  . Social Herbalist on phone: Not on file    Gets together: Not on file    Attends religious service: Not on file    Active member of club or organization: Not on file    Attends meetings of clubs or organizations: Not on file    Relationship status: Not on file  Other Topics Concern  . Not on file  Social History Narrative   Recently moved from [place] to be closer to her two  daughters and grandchildren.   Retired Corporate treasurer.   DNR- forms singed on 05/07/2012 and returned to pt.    Outpatient Encounter Medications as of 11/07/2018  Medication Sig  . acetaminophen (TYLENOL) 500 MG tablet Take 1,000 mg by mouth every 6 (six) hours as needed for mild pain or moderate pain.  . Cholecalciferol (VITAMIN D) 2000 UNITS CAPS Take one by mouth daily  . diclofenac sodium (VOLTAREN) 1 % GEL Apply 4 g topically 4 (four) times daily.  Marland Kitchen ezetimibe (ZETIA) 10 MG tablet Take 1 tablet (10 mg total) by mouth daily. See PCP for more refills  . ketoconazole (NIZORAL) 2 % cream  Apply 1 application topically daily as needed for irritation.   . traMADol (ULTRAM) 50 MG tablet Take 1 tablet (50 mg total) by mouth every 6 (six) hours as needed. (Patient taking differently: Take 50 mg by mouth every 6 (six) hours as needed for moderate pain. )   No facility-administered encounter medications on file as of 11/07/2018.     Activities of Daily Living In your present state of health, do you have any difficulty performing the following activities: 11/07/2018 11/07/2017  Hearing? N N  Vision? N N  Difficulty concentrating or making decisions? N Y  Walking or climbing stairs? N Y  Comment - Related to knee discomfort  Dressing or bathing? N N  Doing errands, shopping? N N  Preparing Food and eating ? N -  Using the Toilet? N -  In the past six months, have you accidently leaked urine? N -  Do you have problems with loss of bowel control? N -  Managing your Medications? N -  Managing your Finances? N -  Housekeeping or managing your Housekeeping? N -  Some recent data might be hidden    Patient Care Team: [name], MD as PCP - General (Family Medicine) Magrinat, Virgie Dad, MD as Consulting Physician (Oncology) Lafayette Dragon, MD (Inactive) as Consulting Physician (Gastroenterology) Owens Loffler, MD as Consulting Physician (Family Medicine)    Assessment:   This is a routine  wellness examination for Chrystle. Physical assessment deferred to PCP.  Exercise Activities and Dietary recommendations Current Exercise Habits: The patient does not participate in regular exercise at present, Exercise limited by: None identified   Diet (meal preparation, eat out, water intake, caffeinated beverages, dairy products, fruits and vegetables): well balanced       Goals    . Increase physical activity    . Weight (lb) < 200 lb (90.7 kg)       Fall Risk Fall Risk  11/07/2018 06/21/2017 04/05/2016 04/05/2016 10/29/2015  Falls in the past year? 1 Yes No No No  Number falls in past yr: 0 1 - - -  Injury with Fall? 0 - - - -  Follow up Education provided;Falls prevention discussed Education provided;Falls prevention discussed - - -     Depression Screen PHQ 2/9 Scores 11/07/2018 06/21/2017 04/05/2016 04/05/2016  PHQ - 2 Score 0 0 0 0     Cognitive Function Ad8 score reviewed for issues:  Issues making decisions:no  Less interest in hobbies / activities:no  Repeats questions, stories (family complaining):no  Trouble using ordinary gadgets (microwave, computer, phone):no  Forgets the month or year: no  Mismanaging finances: no  Remembering appts:no  Daily problems with thinking and/or memory:no Ad8 score is=0     6CIT Screen 04/05/2016  What Year? 0 points  What month? 0 points  What time? 0 points  Count back from 20 0 points  Months in reverse 0 points  Repeat phrase 0 points  Total Score 0    Immunization History  Administered Date(s) Administered  . Fluad Quad(high Dose 65+) 09/25/2018  . Influenza, High Dose Seasonal PF 11/08/2017  . Influenza,inj,Quad PF,6+ Mos 10/02/2013, 10/10/2016  . Influenza-Unspecified 11/10/2012  . Pneumococcal Conjugate-13 05/30/2013  . Pneumococcal Polysaccharide-23 05/07/2012  . Td 05/30/2013  . Zoster 05/07/2012    Screening Tests Health Maintenance  Topic Date Due  . COLONOSCOPY  08/29/2018  . MAMMOGRAM   07/08/2020  . TETANUS/TDAP  05/31/2023  . INFLUENZA VACCINE  Completed  . DEXA SCAN  Completed  .  Hepatitis C Screening  Completed  . PNA vac Low Risk Adult  Completed      Plan:   See you next year!  Continue to eat heart healthy diet (full of fruits, vegetables, whole grains, lean protein, water--limit salt, fat, and sugar intake) and increase physical activity as tolerated.  Continue doing brain stimulating activities (puzzles, reading, adult coloring books, staying active) to keep memory sharp.   Bring a copy of your living will and/or healthcare power of attorney to your next office visit.    I have personally reviewed and noted the following in the patient's chart:   . Medical and social history . Use of alcohol, tobacco or illicit drugs  . Current medications and supplements . Functional ability and status . Nutritional status . Physical activity . Advanced directives . List of other physicians . Hospitalizations, surgeries, and ER visits in previous 12 months . Vitals . Screenings to include cognitive, depression, and falls . Referrals and appointments  In addition, I have reviewed and discussed with patient certain preventive protocols, quality metrics, and best practice recommendations. A written personalized care plan for preventive services as well as general preventive health recommendations were provided to patient.     Shela Nevin, [place]  11/07/2018

## 2018-11-07 ENCOUNTER — Other Ambulatory Visit: Payer: Self-pay

## 2018-11-07 ENCOUNTER — Encounter: Payer: Self-pay | Admitting: *Deleted

## 2018-11-07 ENCOUNTER — Ambulatory Visit (INDEPENDENT_AMBULATORY_CARE_PROVIDER_SITE_OTHER): Payer: Medicare HMO | Admitting: *Deleted

## 2018-11-07 DIAGNOSIS — Z0000 Encounter for general adult medical examination without abnormal findings: Secondary | ICD-10-CM

## 2018-11-07 DIAGNOSIS — Z780 Asymptomatic menopausal state: Secondary | ICD-10-CM

## 2018-11-07 NOTE — Patient Instructions (Signed)
See you next year!  Continue to eat heart healthy diet (full of fruits, vegetables, whole grains, lean protein, water--limit salt, fat, and sugar intake) and increase physical activity as tolerated.  Continue doing brain stimulating activities (puzzles, reading, adult coloring books, staying active) to keep memory sharp.   Bring a copy of your living will and/or healthcare power of attorney to your next office visit.    Kayla Mcgrath , Thank you for taking time to come for your Medicare Wellness Visit. I appreciate your ongoing commitment to your health goals. Please review the following plan we discussed and let me know if I can assist you in the future.   These are the goals we discussed: Goals    . Increase physical activity    . Weight (lb) < 200 lb (90.7 kg)       This is a list of the screening recommended for you and due dates:  Health Maintenance  Topic Date Due  . Colon Cancer Screening  08/29/2018  . Mammogram  07/08/2020  . Tetanus Vaccine  05/31/2023  . Flu Shot  Completed  . DEXA scan (bone density measurement)  Completed  .  Hepatitis C: One time screening is recommended by Center for Disease Control  (CDC) for  adults born from [birth year] through 1964.   Completed  . Pneumonia vaccines  Completed    Preventive Care 41 Years and Older, Female Preventive care refers to lifestyle choices and visits with your health care provider that can promote health and wellness. This includes:  A yearly physical exam. This is also called an annual well check.  Regular dental and eye exams.  Immunizations.  Screening for certain conditions.  Healthy lifestyle choices, such as diet and exercise. What can I expect for my preventive care visit? Physical exam Your health care provider will check:  Height and weight. These may be used to calculate body mass index (BMI), which is a measurement that tells if you are at a healthy weight.  Heart rate and blood pressure.  Your skin for  abnormal spots. Counseling Your health care provider may ask you questions about:  Alcohol, tobacco, and drug use.  Emotional well-being.  Home and relationship well-being.  Sexual activity.  Eating habits.  History of falls.  Memory and ability to understand (cognition).  Work and work Statistician.  Pregnancy and menstrual history. What immunizations do I need?  Influenza (flu) vaccine  This is recommended every year. Tetanus, diphtheria, and pertussis (Tdap) vaccine  You may need a Td booster every 10 years. Varicella (chickenpox) vaccine  You may need this vaccine if you have not already been vaccinated. Zoster (shingles) vaccine  You may need this after age 34. Pneumococcal conjugate (PCV13) vaccine  One dose is recommended after age 15. Pneumococcal polysaccharide (PPSV23) vaccine  One dose is recommended after age 67. Measles, mumps, and rubella (MMR) vaccine  You may need at least one dose of MMR if you were born in 1957 or later. You may also need a second dose. Meningococcal conjugate (MenACWY) vaccine  You may need this if you have certain conditions. Hepatitis A vaccine  You may need this if you have certain conditions or if you travel or work in places where you may be exposed to hepatitis A. Hepatitis B vaccine  You may need this if you have certain conditions or if you travel or work in places where you may be exposed to hepatitis B. Haemophilus influenzae type b (Hib) vaccine  You  may need this if you have certain conditions. You may receive vaccines as individual doses or as more than one vaccine together in one shot (combination vaccines). Talk with your health care provider about the risks and benefits of combination vaccines. What tests do I need? Blood tests  Lipid and cholesterol levels. These may be checked every 5 years, or more frequently depending on your overall health.  Hepatitis C test.  Hepatitis B test. Screening  Lung  cancer screening. You may have this screening every year starting at age 21 if you have a 30-pack-year history of smoking and currently smoke or have quit within the past 15 years.  Colorectal cancer screening. All adults should have this screening starting at age 71 and continuing until age 46. Your health care provider may recommend screening at age 65 if you are at increased risk. You will have tests every 1-10 years, depending on your results and the type of screening test.  Diabetes screening. This is done by checking your blood sugar (glucose) after you have not eaten for a while (fasting). You may have this done every 1-3 years.  Mammogram. This may be done every 1-2 years. Talk with your health care provider about how often you should have regular mammograms.  BRCA-related cancer screening. This may be done if you have a family history of breast, ovarian, tubal, or peritoneal cancers. Other tests  Sexually transmitted disease (STD) testing.  Bone density scan. This is done to screen for osteoporosis. You may have this done starting at age 84. Follow these instructions at home: Eating and drinking  Eat a diet that includes fresh fruits and vegetables, whole grains, lean protein, and low-fat dairy products. Limit your intake of foods with high amounts of sugar, saturated fats, and salt.  Take vitamin and mineral supplements as recommended by your health care provider.  Do not drink alcohol if your health care provider tells you not to drink.  If you drink alcohol: ? Limit how much you have to 0-1 drink a day. ? Be aware of how much alcohol is in your drink. In the [place] one drink equals one 12 oz bottle of beer (355 mL), one 5 oz glass of wine (148 mL), or one 1 oz glass of hard liquor (44 mL). Lifestyle  Take daily care of your teeth and gums.  Stay active. Exercise for at least 30 minutes on 5 or more days each week.  Do not use any products that contain nicotine or tobacco,  such as cigarettes, e-cigarettes, and chewing tobacco. If you need help quitting, ask your health care provider.  If you are sexually active, practice safe sex. Use a condom or other form of protection in order to prevent STIs (sexually transmitted infections).  Talk with your health care provider about taking a low-dose aspirin or statin. What's next?  Go to your health care provider once a year for a well check visit.  Ask your health care provider how often you should have your eyes and teeth checked.  Stay up to date on all vaccines. This information is not intended to replace advice given to you by your health care provider. Make sure you discuss any questions you have with your health care provider. Document Released: 01/23/2015 Document Revised: 12/21/2017 Document Reviewed: 12/21/2017 Elsevier Patient Education  2020 [employer].

## 2018-11-13 IMAGING — DX DG CHEST 2V
2 series · 2 of 2 positions shown · non-contrast
Comparison: None.

CLINICAL DATA: 71-year-old female with cough, fever, shortness of
breath. Former smoker.

EXAM:
CHEST - 2 VIEW

[chest pa]
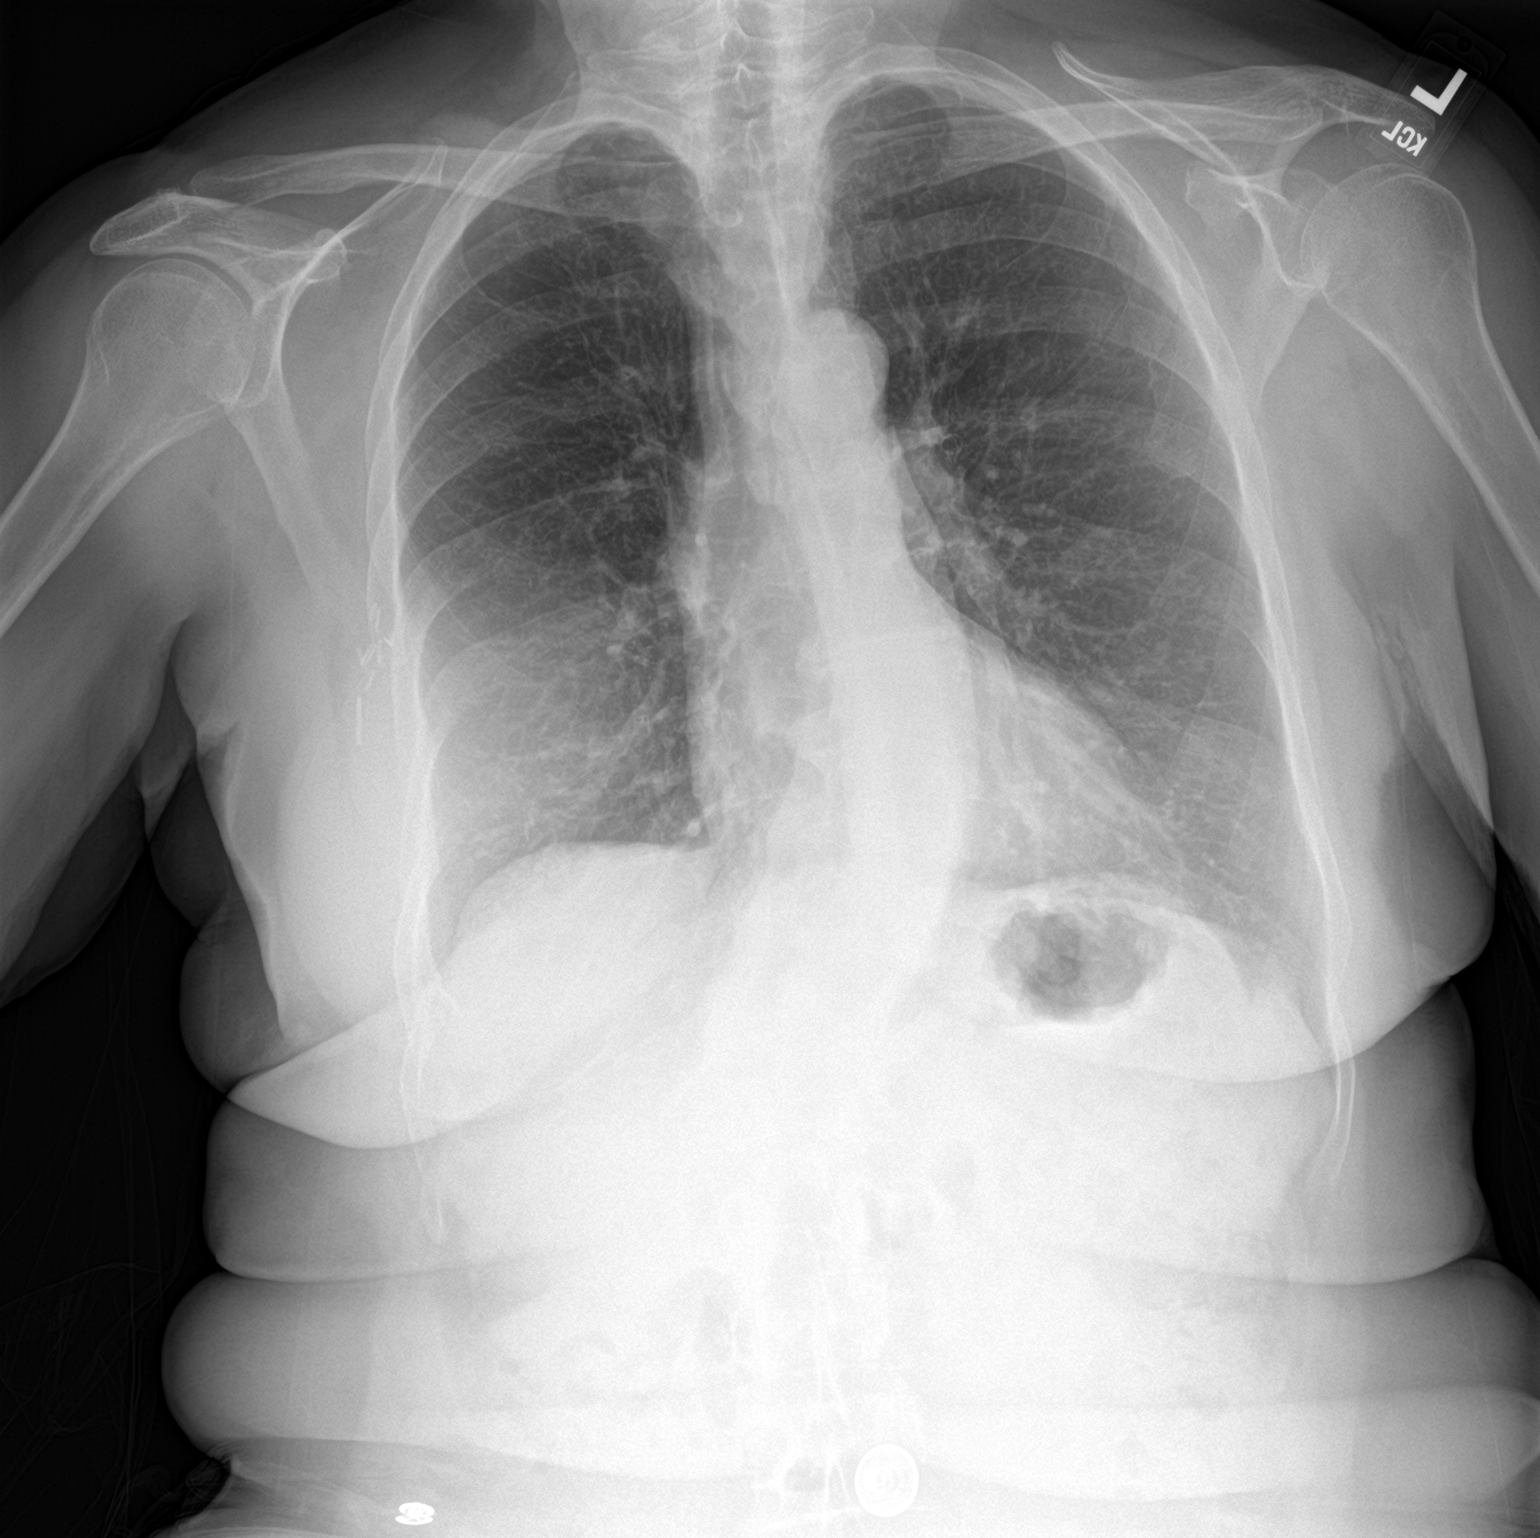

[chest lat]
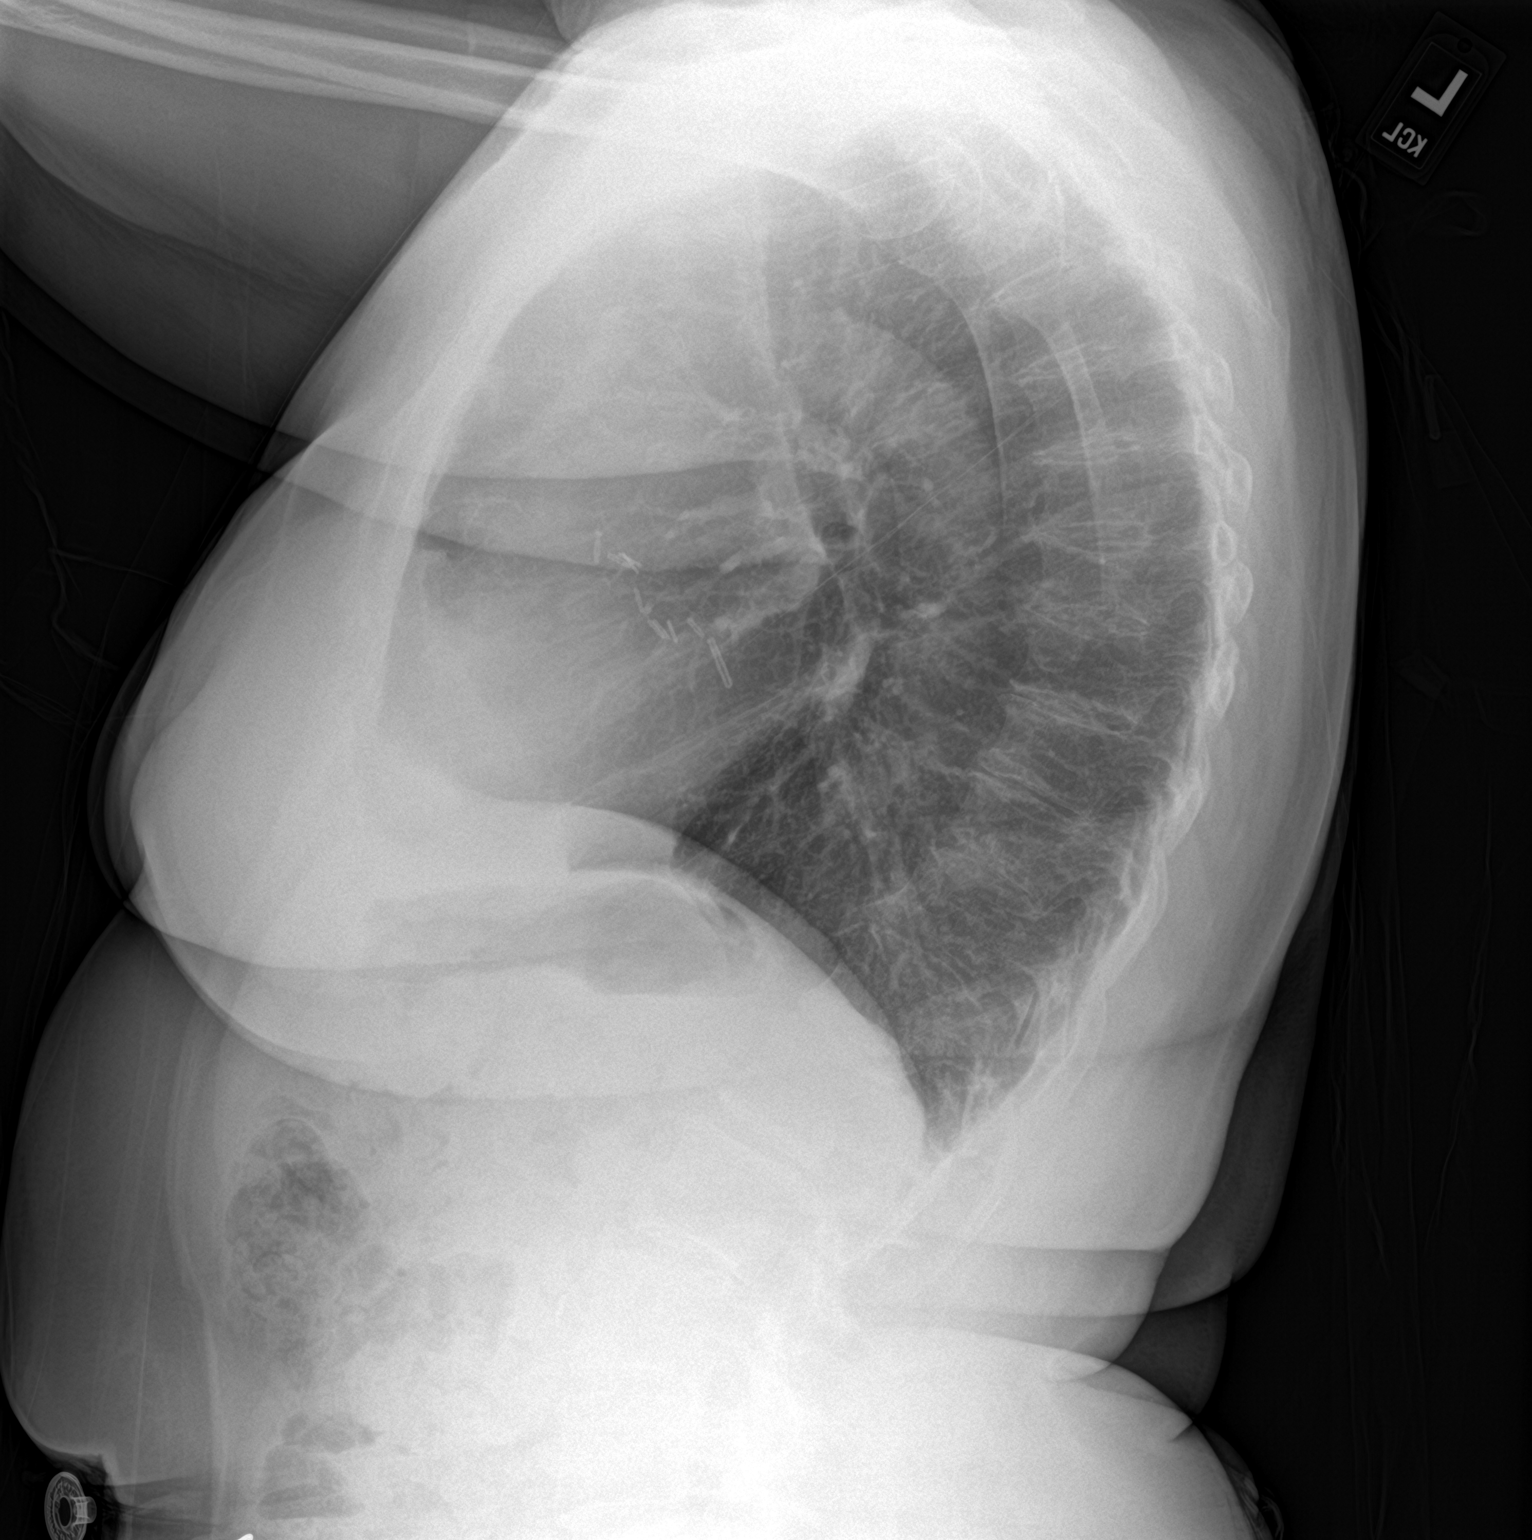

[2 of 2 positions shown; findings below may reference images not displayed]

FINDINGS: Large lung volumes. Normal cardiac size and mediastinal contours.
Visualized tracheal air column is within normal limits. No
pneumothorax, pulmonary edema, pleural effusion or confluent
pulmonary opacity. Bilateral increased pulmonary interstitial
markings. Postoperative changes to the right chest wall including
breast implant on that side. No acute osseous abnormality
identified. Negative visible bowel gas pattern.
IMPRESSION: Increased bilateral pulmonary interstitial markings could be chronic
related to smoking but in this clinical setting may reflect
viral/atypical respiratory infection.

No pleural effusion or other acute cardiopulmonary abnormality.

## 2018-11-24 DIAGNOSIS — Z9009 Acquired absence of other part of head and neck: Secondary | ICD-10-CM | POA: Insufficient documentation

## 2018-11-24 DIAGNOSIS — Z0000 Encounter for general adult medical examination without abnormal findings: Secondary | ICD-10-CM | POA: Insufficient documentation

## 2018-11-24 NOTE — Assessment & Plan Note (Signed)
DEXA scheduled for 1/21.

## 2018-11-24 NOTE — Assessment & Plan Note (Signed)
Repeat labs today

## 2018-11-24 NOTE — Assessment & Plan Note (Signed)
Check labs today.

## 2018-11-24 NOTE — Assessment & Plan Note (Signed)
Due to intrathyroidal partahyroid adenoma. Not on thyroid replacement.  Will check labs today.

## 2018-11-24 NOTE — Assessment & Plan Note (Signed)
Mammogram UTD- last saw oncologist in 07/2017.

## 2018-11-24 NOTE — Assessment & Plan Note (Signed)
On Zetia. Due for labs today.

## 2018-11-24 NOTE — Progress Notes (Unsigned)
Subjective:   Patient ID: Kayla Mcgrath, female    DOB: December 27, 1945, 73 y.o.   MRN: [id number]  Kayla Mcgrath is a pleasant 73 y.o. year old female who presents to clinic today with Annual Exam (? colonoscopy) and Follow-up (Breast CA, HLD, hyperparthyroidism, osteopenia)  on 11/27/2018  HPI:  Health Maintenance  Topic Date Due  . COLONOSCOPY  08/29/2018  . MAMMOGRAM  07/08/2020  . TETANUS/TDAP  05/31/2023  . INFLUENZA VACCINE  Completed  . DEXA SCAN  Completed  . Hepatitis C Screening  Completed  . PNA vac Low Risk Adult  Completed   Overdue for colonoscopy- last done by Dr. Olevia Perches on 08/28/13- did show poylps. Mammogram 07/09/18.  Osteopenia- DEXA scheduled for 01/31/19  Saw Gary Fleet, RN on 11/07/18 for virtual medicare annual wellness visit.  Note reviewed.  Depression screen [facility] 2/9 11/07/2018 06/21/2017 04/05/2016 04/05/2016 06/12/2014  Decreased Interest 0 0 0 0 0  Down, Depressed, Hopeless 0 0 0 0 0  PHQ - 2 Score 0 0 0 0 0     Hyperparathyroidism- with intrathyroidal parathyroid adenoma. S/p neck exploration and left thyroid lobectomy by Dr. [name] on 11/07/17- operative note reviewed.  Lab Results  Component Value Date   TSH 0.66 06/29/2017   Lab Results  Component Value Date   PTH 161 (H) 08/07/2017   CALCIUM 8.8 (L) 11/08/2017     HLD- compliant with zetia 10 mg daily.  Lab Results  Component Value Date   CHOL 204 (H) 06/29/2017   HDL 72.60 06/29/2017   LDLCALC 120 (H) 06/29/2017   LDLDIRECT 161.9 04/30/2012   TRIG 58.0 06/29/2017   CHOLHDL 3 06/29/2017   Lab Results  Component Value Date   ALT 19 07/25/2017   AST 22 07/25/2017   ALKPHOS 74 07/25/2017   BILITOT 0.3 07/25/2017   The 10-year ASCVD risk score Mikey Bussing DC Jr., et al., 2013) is: 6.4%   Values used to calculate the score:     Age: 36 years     Sex: Female     Is Non-Hispanic African American: No     Diabetic: No     Tobacco smoker: No     Systolic Blood Pressure: 123XX123 mmHg    Is BP treated: No     HDL Cholesterol: 72.6 mg/dL     Total Cholesterol: 204 mg/dL    .  Lab Results  Component Value Date   NA 136 11/08/2017   K 3.8 11/08/2017   CL 105 11/08/2017   CO2 26 11/08/2017   Breast CA- doing well. S/p mastectomy after recurrence a few years ago.   Last saw oncology NP, Dr. Jana Hakim on 07/25/17-   Note reviewed.  Completed 5 year course of Tamoxifen.  HLD-  stable with Zetia.  Lab Results  Component Value Date   CHOL 204 (H) 06/29/2017   HDL 72.60 06/29/2017   LDLCALC 120 (H) 06/29/2017   LDLDIRECT 161.9 04/30/2012   TRIG 58.0 06/29/2017   CHOLHDL 3 06/29/2017   Current Outpatient Medications on File Prior to Visit  Medication Sig Dispense Refill  . acetaminophen (TYLENOL) 500 MG tablet Take 1,000 mg by mouth every 6 (six) hours as needed for mild pain or moderate pain.    . Cholecalciferol (VITAMIN D) 2000 UNITS CAPS Take one by mouth daily    . diclofenac sodium (VOLTAREN) 1 % GEL Apply 4 g topically 4 (four) times daily. 500 g 5  . ezetimibe (ZETIA) 10 MG tablet Take  1 tablet (10 mg total) by mouth daily. See PCP for more refills 90 tablet 0  . ketoconazole (NIZORAL) 2 % cream Apply 1 application topically daily as needed for irritation.   2  . traMADol (ULTRAM) 50 MG tablet Take 1 tablet (50 mg total) by mouth every 6 (six) hours as needed. (Patient taking differently: Take 50 mg by mouth every 6 (six) hours as needed for moderate pain. ) 50 tablet 2   No current facility-administered medications on file prior to visit.     Allergies  Allergen Reactions  . Latex Rash    Past Medical History:  Diagnosis Date  . Breast cancer ([place])   . Colon polyps    ?  Marland Kitchen Complication of anesthesia   . DCIS (ductal carcinoma in situ) of breast 2010  . Diverticulosis   . Full dentures   . Hyperlipidemia   . Joint pain   . PONV (postoperative nausea and vomiting)   . Skin cancer   . Wears glasses     Past Surgical History:  Procedure  Laterality Date  . BREAST BIOPSY Right 05/11/2012  . BREAST BIOPSY Right 04/24/2012  . BREAST LUMPECTOMY Right december 2010   cancer-tamoxifen  . BREAST RECONSTRUCTION WITH PLACEMENT OF TISSUE EXPANDER AND FLEX HD (ACELLULAR HYDRATED DERMIS) Right 06/20/2012   Procedure: BREAST RECONSTRUCTION WITH PLACEMENT OF TISSUE EXPANDER AND FLEX HD (ACELLULAR HYDRATED DERMIS);  Surgeon: Theodoro Kos, DO;  Location: [facility];  Service: Plastics;  Laterality: Right;  . BREAST REDUCTION WITH MASTOPEXY Left 10/11/2012   Procedure: LEFT BREAST REDUCTION WITH MASTOPEXY;  Surgeon: Theodoro Kos, DO;  Location: [facility];  Service: Plastics;  Laterality: Left;  . COLONOSCOPY    . DILATION AND CURETTAGE OF UTERUS    . LIPOSUCTION WITH LIPOFILLING Left 10/11/2012   Procedure: LIPOSUCTION WITH LIPOFILLING;  Surgeon: Theodoro Kos, DO;  Location: [facility];  Service: Plastics;  Laterality: Left;  Marland Kitchen MASTECTOMY Right 06/20/2012  . MASTECTOMY W/ SENTINEL NODE BIOPSY Right 06/20/2012   Procedure: right skin sparing MASTECTOMY WITH SENTINEL LYMPH NODE BIOPSY;  Surgeon: Stark Klein, MD;  Location: [facility];  Service: General;  Laterality: Right;  . PARATHYROIDECTOMY Left 11/07/2017   Procedure: NECK EXPLORATION WITH LEFT THYROID LOBECTOMY;  Surgeon: [name], MD;  Location: [facility];  Service: General;  Laterality: Left;  . REDUCTION MAMMAPLASTY Left 10/2012  . REMOVAL OF TISSUE EXPANDER AND PLACEMENT OF IMPLANT Right 10/11/2012   Procedure: REMOVAL OF TISSUE EXPANDER AND PLACEMENT OF SILICONE IMPLANT RIGHT;  Surgeon: Theodoro Kos, DO;  Location: [facility];  Service: Plastics;  Laterality: Right;    Family History  Problem Relation Age of Onset  . Alcohol abuse Mother   . Colon polyps Mother   . Mental illness Mother   . Alcohol abuse Father        suicide  . Mental illness Father   . Heart disease Paternal Grandfather     Social  History   Socioeconomic History  . Marital status: Married    Spouse name: Not on file  . Number of children: 2  . Years of education: Not on file  . Highest education level: Not on file  Occupational History  . Occupation: retired Corporate treasurer  Social Needs  . Financial resource strain: Not on file  . Food insecurity    Worry: Not on file    Inability: Not on file  . Transportation needs    Medical:  Not on file    Non-medical: Not on file  Tobacco Use  . Smoking status: Former Smoker    Types: Cigarettes    Quit date: 06/14/1981    Years since quitting: 37.4  . Smokeless tobacco: Never Used  Substance and Sexual Activity  . Alcohol use: No  . Drug use: No  . Sexual activity: Yes    Birth control/protection: Post-menopausal  Lifestyle  . Physical activity    Days per week: Not on file    Minutes per session: Not on file  . Stress: Not on file  Relationships  . Social Herbalist on phone: Not on file    Gets together: Not on file    Attends religious service: Not on file    Active member of club or organization: Not on file    Attends meetings of clubs or organizations: Not on file    Relationship status: Not on file  . Intimate partner violence    Fear of current or ex partner: Not on file    Emotionally abused: Not on file    Physically abused: Not on file    Forced sexual activity: Not on file  Other Topics Concern  . Not on file  Social History Narrative   Recently moved from [place] to be closer to her two daughters and grandchildren.   Retired Corporate treasurer.   DNR- forms singed on 05/07/2012 and returned to pt.   The PMH, PSH, Social History, Family History, Medications, and allergies have been reviewed in [facility], and have been updated if relevant.   Review of Systems  Constitutional: Negative.   HENT: Negative.   Eyes: Negative.   Respiratory: Negative.   Cardiovascular: Negative.   Gastrointestinal: Negative.   Endocrine: Negative.   Genitourinary: Negative.    Musculoskeletal: Negative.   Skin: Negative.   Allergic/Immunologic: Negative.   Neurological: Negative.   Hematological: Negative.   Psychiatric/Behavioral: Negative.   All other systems reviewed and are negative.      Objective:    There were no vitals taken for this visit.   Physical Exam        Assessment & Plan:   Well woman exam without gynecological exam  Primary hyperparathyroidism ([place]), Chronic - Plan: PTH, Intact and Calcium  Primary cancer of upper inner quadrant of right female breast ([place]), Chronic - Plan: Ferritin  Hyperlipidemia, unspecified hyperlipidemia type - Plan: Comprehensive metabolic panel, Lipid panel  Hyperglycemia  Insomnia, unspecified type  History of lobectomy of thyroid - Plan: TSH, T4, free  Osteopenia determined by x-ray - Plan: Vitamin D (25 hydroxy)  Other elevated white blood cell (WBC) count - Plan: CBC with Differential/Platelet No follow-ups on file.

## 2018-11-24 NOTE — Patient Instructions (Addendum)
Great to see you! I hope you have a very happy birthday.  Please call [facility] to schedule Colonoscopy at [phone] :)  I will call you with your lab results from today and you can view them online.

## 2018-11-26 DIAGNOSIS — H2513 Age-related nuclear cataract, bilateral: Secondary | ICD-10-CM | POA: Diagnosis not present

## 2018-11-26 DIAGNOSIS — G43109 Migraine with aura, not intractable, without status migrainosus: Secondary | ICD-10-CM | POA: Diagnosis not present

## 2018-11-26 DIAGNOSIS — H353131 Nonexudative age-related macular degeneration, bilateral, early dry stage: Secondary | ICD-10-CM | POA: Diagnosis not present

## 2018-11-27 ENCOUNTER — Telehealth: Payer: Self-pay | Admitting: Family Medicine

## 2018-11-27 ENCOUNTER — Ambulatory Visit: Payer: Medicare HMO | Admitting: Family Medicine

## 2018-11-27 NOTE — Telephone Encounter (Signed)
Patient called the office about CPE that was scheduled as a doxy. Patient did not want to do a CPE as a virtual visit. Patient scheduled for in office for CPE on 12/05/2018 and needs to do labs. Patient stated that she will be here for that appointment, however she does need a Rx refill on her Zetia and only has 7 pills left. Patient is requesting Rx refill for 90 days be sent to [facility]. Please contact patient to follow up.

## 2018-11-27 NOTE — Telephone Encounter (Signed)
Called pt and LDM informing her that we received her message. F.Y.I

## 2018-11-27 NOTE — Telephone Encounter (Signed)
Okay to refill rxs as requested. 

## 2018-11-29 MED ORDER — EZETIMIBE 10 MG PO TABS: 10.0000 mg | ORAL_TABLET | Freq: Every day | ORAL | 0 refills | Status: DC

## 2018-11-29 NOTE — Telephone Encounter (Signed)
Refill sent today. #90/0

## 2018-11-29 NOTE — Telephone Encounter (Signed)
Ok to send in 90 day refill, per Dr. Deborra Medina.

## 2018-12-04 ENCOUNTER — Other Ambulatory Visit: Payer: Self-pay

## 2018-12-04 NOTE — Assessment & Plan Note (Signed)
Repeat CBC today.  Lab Results  Component Value Date   WBC 5.3 10/30/2017   HGB 14.1 10/30/2017   HCT 43.3 10/30/2017   MCV 95.8 10/30/2017   PLT 205 10/30/2017

## 2018-12-04 NOTE — Assessment & Plan Note (Signed)
Tolerating zetia, encouraged heart healthy diet, avoid trans fats, minimize simple carbs and saturated fats. Increase exercise as tolerated.  Due for labs today. Low ascvd risk score is reassuring.  The 10-year ASCVD risk score Kayla Mcgrath Kayla Mcgrath Kayla Mcgrath., et al., 2013) is: 6.4%   Values used to calculate the score:     Age: 74 years     Sex: Female     Is Non-Hispanic African American: No     Diabetic: No     Tobacco smoker: No     Systolic Blood Pressure: 123XX123 mmHg     Is BP treated: No     HDL Cholesterol: 72.6 mg/dL     Total Cholesterol: 204 mg/dL

## 2018-12-04 NOTE — Patient Instructions (Addendum)
Great to see you! I hope you have a very happy birthday.  Please call [facility] to schedule Colonoscopy at [phone]   I will call you with your lab results from today and you can view them online.   Trial of trazodone 50mg  one at bedtime for first week, and can increase to 2 at bedtime thereafter if needed.  Trial of melatonin 3mg  about 3-4 hrs before bedtime  Happy Thanksgiving!

## 2018-12-04 NOTE — Progress Notes (Signed)
Subjective:    Patient ID: Kayla Mcgrath, female    DOB: 1945-10-13, 73 y.o.   MRN: [id number]  Chief Complaint  Patient presents with  . Annual Exam    Pt c/o depression and anxiety.   . Follow-up    HPI Patient is in today for well woman exam and follow up of chronic medical conditions and depression.   Health Maintenance  Topic Date Due  . COLONOSCOPY  08/29/2018  . MAMMOGRAM  07/08/2020  . TETANUS/TDAP  05/31/2023  . INFLUENZA VACCINE  Completed  . DEXA SCAN  Completed  . Hepatitis C Screening  Completed  . PNA vac Low Risk Adult  Completed    Overdue for colonoscopy- last done by Dr. Olevia Perches on 08/28/13- did show poylps. Mammogram 07/09/18.  Osteopenia- DEXA scheduled for 01/31/19  Saw Gary Fleet, RN on 11/07/18 for virtual medicare annual wellness visit.  Note reviewed.   Anxiety/depression- Husband recently diagnosed with Alzheimer's dementia.  He is not having behavior issues.  Since then, she has been having nightmares, difficulty sleeping.  No SI or HI.  Depression screen [facility] 2/9 12/05/2018 11/07/2018 06/21/2017 04/05/2016 04/05/2016  Decreased Interest 1 0 0 0 0  Down, Depressed, Hopeless 1 0 0 0 0  PHQ - 2 Score 2 0 0 0 0  Altered sleeping 3 - - - -  Tired, decreased energy 1 - - - -  Change in appetite 0 - - - -  Feeling bad or failure about yourself  1 - - - -  Trouble concentrating 1 - - - -  Moving slowly or fidgety/restless 0 - - - -  Suicidal thoughts 0 - - - -  PHQ-9 Score 8 - - - -  Difficult doing work/chores Somewhat difficult - - - -   GAD 7 : Generalized Anxiety Score 12/05/2018  Nervous, Anxious, on Edge 3  Control/stop worrying 3  Worry too much - different things 3  Trouble relaxing 3  Restless 1  Easily annoyed or irritable 1  Afraid - awful might happen 3  Total GAD 7 Score 17  Anxiety Difficulty Somewhat difficult     Insomnia-  Difficulty falling and staying asleep.  Having nightmares about bad things happening, not  necessarily related to her husband.  Has tried trazodone in past but felt it wasn't helpful but she does not think she gave it enough time.  Hyperparathyroidism- with intrathyroidal parathyroid adenoma. S/p neck exploration and left thyroid lobectomy by Dr. [name] on 11/07/17- operative note reviewed.  Lab Results  Component Value Date   PTH 161 (H) 08/07/2017   CALCIUM 8.8 (L) 11/08/2017   Breast CA- doing well. S/p mastectomy after recurrence a few years ago.  Last saw oncology NP, Dr. Jana Hakim on 07/25/17-  Note reviewed. Completed 5 year course of Tamoxifen.  Mammogram UTD.  HLD- compliant with zetia 10 mg daily.  Lab Results  Component Value Date   CHOL 204 (H) 06/29/2017   HDL 72.60 06/29/2017   LDLCALC 120 (H) 06/29/2017   LDLDIRECT 161.9 04/30/2012   TRIG 58.0 06/29/2017   CHOLHDL 3 06/29/2017   The 10-year ASCVD risk score Mikey Bussing DC Jr., et al., 2013) is: 7.4%   Values used to calculate the score:     Age: 88 years     Sex: Female     Is Non-Hispanic African American: No     Diabetic: No     Tobacco smoker: No     Systolic Blood Pressure:  108 mmHg     Is BP treated: No     HDL Cholesterol: 72.6 mg/dL     Total Cholesterol: 204 mg/dL   Past Medical History:  Diagnosis Date  . Breast cancer ([place])   . Colon polyps    ?  Marland Kitchen Complication of anesthesia   . DCIS (ductal carcinoma in situ) of breast 2010  . Diverticulosis   . Full dentures   . Hyperlipidemia   . Joint pain   . PONV (postoperative nausea and vomiting)   . Skin cancer   . Wears glasses     Past Surgical History:  Procedure Laterality Date  . BREAST BIOPSY Right 05/11/2012  . BREAST BIOPSY Right 04/24/2012  . BREAST LUMPECTOMY Right december 2010   cancer-tamoxifen  . BREAST RECONSTRUCTION WITH PLACEMENT OF TISSUE EXPANDER AND FLEX HD (ACELLULAR HYDRATED DERMIS) Right 06/20/2012   Procedure: BREAST RECONSTRUCTION WITH PLACEMENT OF TISSUE EXPANDER AND FLEX HD (ACELLULAR HYDRATED  DERMIS);  Surgeon: Theodoro Kos, DO;  Location: [place];  Service: Plastics;  Laterality: Right;  . BREAST REDUCTION WITH MASTOPEXY Left 10/11/2012   Procedure: LEFT BREAST REDUCTION WITH MASTOPEXY;  Surgeon: Theodoro Kos, DO;  Location: [place];  Service: Plastics;  Laterality: Left;  . COLONOSCOPY    . DILATION AND CURETTAGE OF UTERUS    . LIPOSUCTION WITH LIPOFILLING Left 10/11/2012   Procedure: LIPOSUCTION WITH LIPOFILLING;  Surgeon: Theodoro Kos, DO;  Location: [place];  Service: Plastics;  Laterality: Left;  Marland Kitchen MASTECTOMY Right 06/20/2012  . MASTECTOMY W/ SENTINEL NODE BIOPSY Right 06/20/2012   Procedure: right skin sparing MASTECTOMY WITH SENTINEL LYMPH NODE BIOPSY;  Surgeon: Stark Klein, MD;  Location: [place];  Service: General;  Laterality: Right;  . PARATHYROIDECTOMY Left 11/07/2017   Procedure: NECK EXPLORATION WITH LEFT THYROID LOBECTOMY;  Surgeon: [name], MD;  Location: [facility];  Service: General;  Laterality: Left;  . REDUCTION MAMMAPLASTY Left 10/2012  . REMOVAL OF TISSUE EXPANDER AND PLACEMENT OF IMPLANT Right 10/11/2012   Procedure: REMOVAL OF TISSUE EXPANDER AND PLACEMENT OF SILICONE IMPLANT RIGHT;  Surgeon: Theodoro Kos, DO;  Location: [place];  Service: Plastics;  Laterality: Right;    Family History  Problem Relation Age of Onset  . Alcohol abuse Mother   . Colon polyps Mother   . Mental illness Mother   . Alcohol abuse Father        suicide  . Mental illness Father   . Heart disease Paternal Grandfather     Social History   Socioeconomic History  . Marital status: Married    Spouse name: Not on file  . Number of children: 2  . Years of education: Not on file  . Highest education level: Not on file  Occupational History  . Occupation: retired Corporate treasurer  Social Needs  . Financial resource strain: Not on file  . Food insecurity    Worry: Not on file    Inability: Not  on file  . Transportation needs    Medical: Not on file    Non-medical: Not on file  Tobacco Use  . Smoking status: Former Smoker    Types: Cigarettes    Quit date: 06/14/1981    Years since quitting: 37.5  . Smokeless tobacco: Never Used  Substance and Sexual Activity  . Alcohol use: No  . Drug use: No  . Sexual activity: Yes    Birth control/protection: Post-menopausal  Lifestyle  . Physical activity  Days per week: Not on file    Minutes per session: Not on file  . Stress: Not on file  Relationships  . Social Herbalist on phone: Not on file    Gets together: Not on file    Attends religious service: Not on file    Active member of club or organization: Not on file    Attends meetings of clubs or organizations: Not on file    Relationship status: Not on file  . Intimate partner violence    Fear of current or ex partner: Not on file    Emotionally abused: Not on file    Physically abused: Not on file    Forced sexual activity: Not on file  Other Topics Concern  . Not on file  Social History Narrative   Recently moved from [place] to be closer to her two daughters and grandchildren.   Retired Corporate treasurer.   DNR- forms singed on 05/07/2012 and returned to pt.    Outpatient Medications Prior to Visit  Medication Sig Dispense Refill  . acetaminophen (TYLENOL) 500 MG tablet Take 1,000 mg by mouth every 6 (six) hours as needed for mild pain or moderate pain.    . Cholecalciferol (VITAMIN D) 2000 UNITS CAPS Take one by mouth daily    . diclofenac sodium (VOLTAREN) 1 % GEL Apply 4 g topically 4 (four) times daily. 500 g 5  . ezetimibe (ZETIA) 10 MG tablet Take 1 tablet (10 mg total) by mouth daily. See PCP for more refills 90 tablet 0  . Ginger, Zingiber officinalis, (GINGER ROOT) 250 MG CAPS Take 300 mg by mouth daily.    Marland Kitchen ketoconazole (NIZORAL) 2 % cream Apply 1 application topically daily as needed for irritation.   2  . traMADol (ULTRAM) 50 MG tablet Take 1 tablet (50 mg  total) by mouth every 6 (six) hours as needed. (Patient taking differently: Take 50 mg by mouth every 6 (six) hours as needed for moderate pain. ) 50 tablet 2  . Turmeric 400 MG CAPS Take 300 mg by mouth daily.     No facility-administered medications prior to visit.     Allergies  Allergen Reactions  . Latex Rash    Review of Systems  Constitutional: Negative.  Negative for fever and malaise/fatigue.  HENT: Negative.  Negative for congestion and hearing loss.   Eyes: Negative.  Negative for blurred vision, discharge and redness.  Respiratory: Negative.  Negative for cough and shortness of breath.   Cardiovascular: Negative.  Negative for chest pain, palpitations and leg swelling.  Gastrointestinal: Negative.  Negative for abdominal pain, constipation and heartburn.  Genitourinary: Negative.  Negative for dysuria.  Musculoskeletal: Negative.  Negative for falls.  Skin: Negative.  Negative for rash.  Neurological: Negative.  Negative for loss of consciousness and headaches.  Endo/Heme/Allergies: Negative.  Does not bruise/bleed easily.  Psychiatric/Behavioral: Negative for depression, hallucinations, memory loss, substance abuse and suicidal ideas. The patient has insomnia. The patient is not nervous/anxious.   All other systems reviewed and are negative.      Objective:    BP 108/68 (BP Location: Right Arm, Patient Position: Sitting, Cuff Size: Normal)   Pulse 86   Temp (!) 96.1 F (35.6 C) (Temporal)   Ht '5\' 2"'$  (1.575 m)   Wt 163 lb 12.8 oz (74.3 kg)   SpO2 97%   BMI 29.96 kg/m   Physical Exam Vitals signs and nursing note reviewed.  Constitutional:  Appearance: Normal appearance. She is not ill-appearing.  HENT:     Head: Normocephalic and atraumatic.     Right Ear: External ear normal.     Left Ear: External ear normal.     Nose: Nose normal.  Eyes:     General:        Right eye: No discharge.        Left eye: No discharge.  Cardiovascular:     Rate and  Rhythm: Normal rate and regular rhythm.     Heart sounds: Normal heart sounds.  Pulmonary:     Effort: Pulmonary effort is normal.     Breath sounds: No wheezing.  Abdominal:     Palpations: Abdomen is soft. There is no mass.     Tenderness: There is no abdominal tenderness. There is no guarding.  Musculoskeletal: Normal range of motion.     Right lower leg: No edema.     Left lower leg: No edema.  Skin:    General: Skin is dry.  Neurological:     Mental Status: She is alert and oriented to person, place, and time.     Deep Tendon Reflexes: Reflexes normal.  Psychiatric:        Mood and Affect: Mood normal.        Behavior: Behavior normal.     BP 108/68 (BP Location: Right Arm, Patient Position: Sitting, Cuff Size: Normal)   Pulse 86   Temp (!) 96.1 F (35.6 C) (Temporal)   Ht '5\' 2"'$  (1.575 m)   Wt 163 lb 12.8 oz (74.3 kg)   SpO2 97%   BMI 29.96 kg/m  Wt Readings from Last 3 Encounters:  12/05/18 163 lb 12.8 oz (74.3 kg)  10/10/18 164 lb 8 oz (74.6 kg)  11/07/17 165 lb 2 oz (74.9 kg)     Lab Results  Component Value Date   WBC 5.3 10/30/2017   HGB 14.1 10/30/2017   HCT 43.3 10/30/2017   PLT 205 10/30/2017   GLUCOSE 128 (H) 11/08/2017   CHOL 204 (H) 06/29/2017   TRIG 58.0 06/29/2017   HDL 72.60 06/29/2017   LDLDIRECT 161.9 04/30/2012   LDLCALC 120 (H) 06/29/2017   ALT 19 07/25/2017   AST 22 07/25/2017   NA 136 11/08/2017   K 3.8 11/08/2017   CL 105 11/08/2017   CREATININE 0.65 11/08/2017   BUN 12 11/08/2017   CO2 26 11/08/2017   TSH 0.66 06/29/2017   HGBA1C 5.6 07/11/2017    Lab Results  Component Value Date   TSH 0.66 06/29/2017   Lab Results  Component Value Date   WBC 5.3 10/30/2017   HGB 14.1 10/30/2017   HCT 43.3 10/30/2017   MCV 95.8 10/30/2017   PLT 205 10/30/2017   Lab Results  Component Value Date   NA 136 11/08/2017   K 3.8 11/08/2017   CHLORIDE 105 11/02/2016   CO2 26 11/08/2017   GLUCOSE 128 (H) 11/08/2017   BUN 12 11/08/2017    CREATININE 0.65 11/08/2017   BILITOT 0.3 07/25/2017   ALKPHOS 74 07/25/2017   AST 22 07/25/2017   ALT 19 07/25/2017   PROT 6.7 07/25/2017   ALBUMIN 3.7 07/25/2017   CALCIUM 8.8 (L) 11/08/2017   ANIONGAP 5 11/08/2017   EGFR >60 11/02/2016   GFR 81.07 06/29/2017   Lab Results  Component Value Date   CHOL 204 (H) 06/29/2017   Lab Results  Component Value Date   HDL 72.60 06/29/2017   Lab Results  Component  Value Date   LDLCALC 120 (H) 06/29/2017   Lab Results  Component Value Date   TRIG 58.0 06/29/2017   Lab Results  Component Value Date   CHOLHDL 3 06/29/2017   Lab Results  Component Value Date   HGBA1C 5.6 07/11/2017       Assessment & Plan:   Problem List Items Addressed This Visit      Active Problems   Primary cancer of upper inner quadrant of right female breast [facility])    S/p mastectomy after recurrence a few years ago.  Last saw oncology NP, Dr. Jana Hakim on 07/25/17-  Note reviewed. Completed 5 year course of Tamoxifen.  Mammogram UTD.       HLD (hyperlipidemia)    Tolerating zetia, encouraged heart healthy diet, avoid trans fats, minimize simple carbs and saturated fats. Increase exercise as tolerated.  Due for labs today. Low ascvd risk score is reassuring.  The 10-year ASCVD risk score Mikey Bussing DC Jr., et al., 2013) is: 6.4%   Values used to calculate the score:     Age: 72 years     Sex: Female     Is Non-Hispanic African American: No     Diabetic: No     Tobacco smoker: No     Systolic Blood Pressure: 814 mmHg     Is BP treated: No     HDL Cholesterol: 72.6 mg/dL     Total Cholesterol: 204 mg/dL        Relevant Orders   Comprehensive metabolic panel   Lipid panel   Insomnia    In addition to the time I spent performing CPE, I spent an additional 28mnutes face-to-face with patient with over 50% of that time spent counseling on insomnia, adjustment disorder, hyperparathyroidism, HLD. Breast CA.  The problem of recurrent insomnia  is discussed. Avoidance of caffeine sources is strongly encouraged. Sleep hygiene issues are reviewed.   She wants to try trazodone again after we discussed tx options- Trial of trazodone '50mg'$  one at bedtime for first week, and can increase to 2 at bedtime thereafter if needed.  Trial of melatonin '3mg'$  about 3-4 hrs before bedtime         Osteopenia determined by x-ray    DEXA scheduled for 01/31/19      Leukocytosis    Repeat CBC today.  Lab Results  Component Value Date   WBC 5.3 10/30/2017   HGB 14.1 10/30/2017   HCT 43.3 10/30/2017   MCV 95.8 10/30/2017   PLT 205 10/30/2017         Relevant Orders   CBC with Differential/Platelet   Primary hyperparathyroidism ([place]    with intrathyroidal parathyroid adenoma. S/p neck exploration and left thyroid lobectomy by Dr. TArmandina Gemmaon 11/07/17.  Will check additional labs today.      History of lobectomy of thyroid    Due to intrathyroidal partahyroid adenoma. Not on thyroid replacement.  Will check labs today.      Relevant Orders   T4, free   TSH   Well woman exam without gynecological exam - Primary    Reviewed preventive care protocols, scheduled due services, and updated immunizations Discussed nutrition, exercise, diet, and healthy lifestyle.  In addition to the time I spent performing CPE, I spent an additional 25 minutes face-to-face with patient with over 50% of that time spent counseling and discussing hyperparathyroidism, breast cancer, osteopenia, HLD.  Health Maintenance  Topic Date Due  . COLONOSCOPY  08/29/2018  . MAMMOGRAM  07/08/2020  .  TETANUS/TDAP  05/31/2023  . INFLUENZA VACCINE  Completed  . DEXA SCAN  Completed  . Hepatitis C Screening  Completed  . PNA vac Low Risk Adult  Completed   Overdue for colonoscopy- last done by Dr. Olevia Perches on 08/28/13- did show poylps. Mammogram 07/09/18.  GI referral placed for colonoscopy.       Adjustment disorder with anxious mood    She feels she is  coping okay without a therapist.  She wants to see if sleep will help her feel better.  See below under insomia       Other Visit Diagnoses    Hyperparathyroidism ([place])       Relevant Orders   PTH, Intact and Calcium   Vitamin D (25 hydroxy)   Screening for colon cancer       Relevant Orders   Ambulatory referral to Gastroenterology   History of colonic polyps       Relevant Orders   Ambulatory referral to Gastroenterology   Encounter for follow-up surveillance of skin cancer       Relevant Orders   Ambulatory referral to Dermatology      I am having Kayla Mcgrath start on traZODone. I am also having her maintain her Vitamin D, ketoconazole, traMADol, acetaminophen, diclofenac sodium, ezetimibe, Ginger Root, and Turmeric.  Meds ordered this encounter  Medications  . traZODone (DESYREL) 50 MG tablet    Sig: 1 tablet by mouth before bedtime for first week, and can increase to 2 at bedtime thereafter if needed.    Dispense:  60 tablet    Refill:  3     Arnette Norris, MD  This visit occurred during the SARS-CoV-2 public health emergency.  Safety protocols were in place, including screening questions prior to the visit, additional usage of staff PPE, and extensive cleaning of exam room while observing appropriate contact time as indicated for disinfecting solutions.

## 2018-12-04 NOTE — Assessment & Plan Note (Signed)
DEXA scheduled for 01/31/19

## 2018-12-04 NOTE — Assessment & Plan Note (Signed)
Due to intrathyroidal partahyroid adenoma. Not on thyroid replacement.  Will check labs today.

## 2018-12-04 NOTE — Assessment & Plan Note (Signed)
with intrathyroidal parathyroid adenoma. S/p neck exploration and left thyroid lobectomy by Dr. [name] on 11/07/17.  Will check additional labs today.

## 2018-12-04 NOTE — Assessment & Plan Note (Signed)
Reviewed preventive care protocols, scheduled due services, and updated immunizations Discussed nutrition, exercise, diet, and healthy lifestyle.  In addition to the time I spent performing CPE, I spent an additional 25 minutes face-to-face with patient with over 50% of that time spent counseling and discussing hyperparathyroidism, breast cancer, osteopenia, HLD.  Health Maintenance  Topic Date Due  . COLONOSCOPY  08/29/2018  . MAMMOGRAM  07/08/2020  . TETANUS/TDAP  05/31/2023  . INFLUENZA VACCINE  Completed  . DEXA SCAN  Completed  . Hepatitis C Screening  Completed  . PNA vac Low Risk Adult  Completed   Overdue for colonoscopy- last done by Dr. Olevia Perches on 08/28/13- did show poylps. Mammogram 07/09/18.  GI referral placed for colonoscopy.

## 2018-12-04 NOTE — Assessment & Plan Note (Signed)
S/p mastectomy after recurrence a few years ago.  Last saw oncology NP, Dr. Jana Hakim on 07/25/17-  Note reviewed. Completed 5 year course of Tamoxifen.  Mammogram UTD.

## 2018-12-05 ENCOUNTER — Ambulatory Visit (INDEPENDENT_AMBULATORY_CARE_PROVIDER_SITE_OTHER): Payer: Medicare HMO | Admitting: Family Medicine

## 2018-12-05 ENCOUNTER — Encounter: Payer: Self-pay | Admitting: Family Medicine

## 2018-12-05 ENCOUNTER — Encounter: Payer: Self-pay | Admitting: Gastroenterology

## 2018-12-05 VITALS — BP 108/68 | HR 86 | Temp 96.1°F | Ht 62.0 in | Wt 163.8 lb

## 2018-12-05 DIAGNOSIS — M8580 Other specified disorders of bone density and structure, unspecified site: Secondary | ICD-10-CM | POA: Diagnosis not present

## 2018-12-05 DIAGNOSIS — E785 Hyperlipidemia, unspecified: Secondary | ICD-10-CM

## 2018-12-05 DIAGNOSIS — Z0000 Encounter for general adult medical examination without abnormal findings: Secondary | ICD-10-CM

## 2018-12-05 DIAGNOSIS — C50211 Malignant neoplasm of upper-inner quadrant of right female breast: Secondary | ICD-10-CM | POA: Diagnosis not present

## 2018-12-05 DIAGNOSIS — G4700 Insomnia, unspecified: Secondary | ICD-10-CM

## 2018-12-05 DIAGNOSIS — D72828 Other elevated white blood cell count: Secondary | ICD-10-CM

## 2018-12-05 DIAGNOSIS — Z85828 Personal history of other malignant neoplasm of skin: Secondary | ICD-10-CM | POA: Diagnosis not present

## 2018-12-05 DIAGNOSIS — F4322 Adjustment disorder with anxiety: Secondary | ICD-10-CM

## 2018-12-05 DIAGNOSIS — E213 Hyperparathyroidism, unspecified: Secondary | ICD-10-CM | POA: Diagnosis not present

## 2018-12-05 DIAGNOSIS — Z9009 Acquired absence of other part of head and neck: Secondary | ICD-10-CM | POA: Diagnosis not present

## 2018-12-05 DIAGNOSIS — Z86010 Personal history of colonic polyps: Secondary | ICD-10-CM

## 2018-12-05 DIAGNOSIS — E210 Primary hyperparathyroidism: Secondary | ICD-10-CM | POA: Diagnosis not present

## 2018-12-05 DIAGNOSIS — Z08 Encounter for follow-up examination after completed treatment for malignant neoplasm: Secondary | ICD-10-CM

## 2018-12-05 DIAGNOSIS — Z1211 Encounter for screening for malignant neoplasm of colon: Secondary | ICD-10-CM

## 2018-12-05 HISTORY — DX: Adjustment disorder with anxiety: F43.22

## 2018-12-05 MED ORDER — TRAMADOL HCL 50 MG PO TABS: 50.0000 mg | ORAL_TABLET | Freq: Four times a day (QID) | ORAL | 2 refills | Status: DC | PRN

## 2018-12-05 MED ORDER — TRAZODONE HCL 50 MG PO TABS: ORAL_TABLET | ORAL | 3 refills | Status: DC

## 2018-12-05 NOTE — Assessment & Plan Note (Signed)
In addition to the time I spent performing CPE, I spent an additional 61minutes face-to-face with patient with over 50% of that time spent counseling on insomnia, adjustment disorder, hyperparathyroidism, HLD. Breast CA.  The problem of recurrent insomnia is discussed. Avoidance of caffeine sources is strongly encouraged. Sleep hygiene issues are reviewed.   She wants to try trazodone again after we discussed tx options- Trial of trazodone 50mg  one at bedtime for first week, and can increase to 2 at bedtime thereafter if needed.  Trial of melatonin 3mg  about 3-4 hrs before bedtime

## 2018-12-05 NOTE — Addendum Note (Signed)
Addended by: [name] on: 12/05/2018 10:09 AM   Modules accepted: Orders

## 2018-12-05 NOTE — Assessment & Plan Note (Signed)
She feels she is coping okay without a therapist.  She wants to see if sleep will help her feel better.  See below under insomia

## 2018-12-06 LAB — COMPREHENSIVE METABOLIC PANEL
AG Ratio: 1.5 (calc) (ref 1.0–2.5)
ALT: 21 U/L (ref 6–29)
AST: 24 U/L (ref 10–35)
Albumin: 4.1 g/dL (ref 3.6–5.1)
Alkaline phosphatase (APISO): 73 U/L (ref 37–153)
BUN: 14 mg/dL (ref 7–25)
CO2: 23 mmol/L (ref 20–32)
Calcium: 9.3 mg/dL (ref 8.6–10.4)
Chloride: 102 mmol/L (ref 98–110)
Creat: 0.84 mg/dL (ref 0.60–0.93)
Globulin: 2.7 g/dL (calc) (ref 1.9–3.7)
Glucose, Bld: 100 mg/dL — ABNORMAL HIGH (ref 65–99)
Potassium: 4.2 mmol/L (ref 3.5–5.3)
Sodium: 138 mmol/L (ref 135–146)
Total Bilirubin: 0.5 mg/dL (ref 0.2–1.2)
Total Protein: 6.8 g/dL (ref 6.1–8.1)

## 2018-12-06 LAB — CBC WITH DIFFERENTIAL/PLATELET
Absolute Monocytes: 400 cells/uL (ref 200–950)
Basophils Absolute: 52 cells/uL (ref 0–200)
Basophils Relative: 0.9 %
Eosinophils Absolute: 99 cells/uL (ref 15–500)
Eosinophils Relative: 1.7 %
HCT: 43.4 % (ref 35.0–45.0)
Hemoglobin: 14.3 g/dL (ref 11.7–15.5)
Lymphs Abs: 1108 cells/uL (ref 850–3900)
MCH: 31.2 pg (ref 27.0–33.0)
MCHC: 32.9 g/dL (ref 32.0–36.0)
MCV: 94.6 fL (ref 80.0–100.0)
MPV: 11.6 fL (ref 7.5–12.5)
Monocytes Relative: 6.9 %
Neutro Abs: 4141 cells/uL (ref 1500–7800)
Neutrophils Relative %: 71.4 %
Platelets: 234 10*3/uL (ref 140–400)
RBC: 4.59 10*6/uL (ref 3.80–5.10)
RDW: 12.6 % (ref 11.0–15.0)
Total Lymphocyte: 19.1 %
WBC: 5.8 10*3/uL (ref 3.8–10.8)

## 2018-12-06 LAB — LIPID PANEL
Cholesterol: 239 mg/dL — ABNORMAL HIGH (ref <200)
HDL: 74 mg/dL (ref >=50)
LDL Cholesterol (Calc): 148 mg/dL (calc) — ABNORMAL HIGH
Non-HDL Cholesterol (Calc): 165 mg/dL (calc) — ABNORMAL HIGH (ref <130)
Total CHOL/HDL Ratio: 3.2 (calc) (ref <5.0)
Triglycerides: 71 mg/dL (ref <150)

## 2018-12-06 LAB — T4, FREE: Free T4: 1.2 ng/dL (ref 0.8–1.8)

## 2018-12-06 LAB — VITAMIN D 25 HYDROXY (VIT D DEFICIENCY, FRACTURES): Vit D, 25-Hydroxy: 28 ng/mL — ABNORMAL LOW (ref 30–100)

## 2018-12-06 LAB — TSH: TSH: 2.87 mIU/L (ref 0.40–4.50)

## 2018-12-07 LAB — PTH, INTACT AND CALCIUM
Calcium: 9.4 mg/dL (ref 8.6–10.4)
PTH: 24 pg/mL (ref 14–64)

## 2018-12-11 ENCOUNTER — Encounter: Payer: Self-pay | Admitting: Family Medicine

## 2018-12-26 DIAGNOSIS — D485 Neoplasm of uncertain behavior of skin: Secondary | ICD-10-CM | POA: Diagnosis not present

## 2018-12-26 DIAGNOSIS — X32XXXD Exposure to sunlight, subsequent encounter: Secondary | ICD-10-CM | POA: Diagnosis not present

## 2018-12-26 DIAGNOSIS — L570 Actinic keratosis: Secondary | ICD-10-CM | POA: Diagnosis not present

## 2018-12-26 DIAGNOSIS — L821 Other seborrheic keratosis: Secondary | ICD-10-CM | POA: Diagnosis not present

## 2018-12-26 DIAGNOSIS — D225 Melanocytic nevi of trunk: Secondary | ICD-10-CM | POA: Diagnosis not present

## 2019-01-02 ENCOUNTER — Other Ambulatory Visit: Payer: Self-pay | Admitting: Family Medicine

## 2019-01-02 NOTE — Telephone Encounter (Signed)
Last OV 12/05/18 Last fill 12/05/18  #60/3

## 2019-01-14 DIAGNOSIS — H353131 Nonexudative age-related macular degeneration, bilateral, early dry stage: Secondary | ICD-10-CM | POA: Diagnosis not present

## 2019-01-14 DIAGNOSIS — Z01818 Encounter for other preprocedural examination: Secondary | ICD-10-CM | POA: Diagnosis not present

## 2019-01-14 DIAGNOSIS — H2511 Age-related nuclear cataract, right eye: Secondary | ICD-10-CM | POA: Diagnosis not present

## 2019-01-14 DIAGNOSIS — H2512 Age-related nuclear cataract, left eye: Secondary | ICD-10-CM | POA: Diagnosis not present

## 2019-01-14 DIAGNOSIS — G43109 Migraine with aura, not intractable, without status migrainosus: Secondary | ICD-10-CM | POA: Diagnosis not present

## 2019-01-14 DIAGNOSIS — H2513 Age-related nuclear cataract, bilateral: Secondary | ICD-10-CM | POA: Diagnosis not present

## 2019-01-16 ENCOUNTER — Encounter: Payer: Medicare HMO | Admitting: Gastroenterology

## 2019-01-22 ENCOUNTER — Encounter: Payer: Self-pay | Admitting: Family Medicine

## 2019-01-29 ENCOUNTER — Encounter: Payer: Self-pay | Admitting: Family Medicine

## 2019-01-31 ENCOUNTER — Other Ambulatory Visit: Payer: Self-pay

## 2019-01-31 ENCOUNTER — Ambulatory Visit
Admission: RE | Admit: 2019-01-31 | Discharge: 2019-01-31 | Disposition: A | Discharge status: Home / Self Care | Payer: Medicare HMO | Source: Ambulatory Visit | Attending: Family Medicine | Admitting: Family Medicine

## 2019-01-31 DIAGNOSIS — Z780 Asymptomatic menopausal state: Secondary | ICD-10-CM | POA: Diagnosis not present

## 2019-01-31 DIAGNOSIS — M8589 Other specified disorders of bone density and structure, multiple sites: Secondary | ICD-10-CM | POA: Diagnosis not present

## 2019-02-14 DIAGNOSIS — H25812 Combined forms of age-related cataract, left eye: Secondary | ICD-10-CM | POA: Diagnosis not present

## 2019-02-14 DIAGNOSIS — H2512 Age-related nuclear cataract, left eye: Secondary | ICD-10-CM | POA: Diagnosis not present

## 2019-03-02 ENCOUNTER — Ambulatory Visit: Payer: Medicare HMO | Attending: Internal Medicine

## 2019-03-02 DIAGNOSIS — Z23 Encounter for immunization: Secondary | ICD-10-CM | POA: Insufficient documentation

## 2019-03-02 NOTE — Progress Notes (Signed)
   Covid-19 Vaccination Clinic  Name:  Kayla Mcgrath    MRN: [id number] DOB: 1945-04-22  03/02/2019  [name] was observed post Covid-19 immunization for 15 minutes without incidence. She was provided with Vaccine Information Sheet and instruction to access the V-Safe system.   [name] was instructed to call 911 with any severe reactions post vaccine: Marland Kitchen Difficulty breathing  . Swelling of your face and throat  . A fast heartbeat  . A bad rash all over your body  . Dizziness and weakness    Immunizations Administered    Name Date Dose VIS Date Route   Pfizer COVID-19 Vaccine 03/02/2019  9:27 AM 0.3 mL 12/21/2018 Intramuscular   Manufacturer: [place]   Lot: [id number]   [place]: [id number]

## 2019-03-03 ENCOUNTER — Ambulatory Visit: Payer: Medicare HMO

## 2019-03-07 DIAGNOSIS — H2511 Age-related nuclear cataract, right eye: Secondary | ICD-10-CM | POA: Diagnosis not present

## 2019-03-07 DIAGNOSIS — H25811 Combined forms of age-related cataract, right eye: Secondary | ICD-10-CM | POA: Diagnosis not present

## 2019-03-11 HISTORY — PX: OTHER SURGICAL HISTORY: SHX169

## 2019-03-26 ENCOUNTER — Ambulatory Visit: Payer: Medicare HMO | Attending: Internal Medicine

## 2019-03-26 DIAGNOSIS — Z23 Encounter for immunization: Secondary | ICD-10-CM

## 2019-03-26 NOTE — Progress Notes (Signed)
   Covid-19 Vaccination Clinic  Name:  Kayla Mcgrath    MRN: [id number] DOB: 09-Jun-1945  03/26/2019  Kayla Mcgrath was observed post Covid-19 immunization for 15 minutes without incident. She was provided with Vaccine Information Sheet and instruction to access the V-Safe system.   Kayla Mcgrath was instructed to call 911 with any severe reactions post vaccine: Marland Kitchen Difficulty breathing  . Swelling of face and throat  . A fast heartbeat  . A bad rash all over body  . Dizziness and weakness   Immunizations Administered    Name Date Dose VIS Date Route   Pfizer COVID-19 Vaccine 03/26/2019 10:10 AM 0.3 mL 12/21/2018 Intramuscular   Manufacturer: [place]   Lot: [id number]   [place]: [id number]

## 2019-04-02 ENCOUNTER — Other Ambulatory Visit: Payer: Self-pay

## 2019-04-02 ENCOUNTER — Telehealth: Payer: Self-pay | Admitting: General Practice

## 2019-04-02 MED ORDER — EZETIMIBE 10 MG PO TABS: 10.0000 mg | ORAL_TABLET | Freq: Every day | ORAL | 0 refills | Status: DC

## 2019-04-02 NOTE — Telephone Encounter (Signed)
Last OV 01/14/2019 Last fill 11/29/2019  #90/0 Next fill 04/26/2019

## 2019-04-02 NOTE — Telephone Encounter (Signed)
Patient is calling and requesting a refill for ezetimibe sent to [facility]. Pt has a TOC appointment with Dr. Loletha Grayer on 4/16/. CB is [phone].

## 2019-04-09 DIAGNOSIS — Z0100 Encounter for examination of eyes and vision without abnormal findings: Secondary | ICD-10-CM | POA: Diagnosis not present

## 2019-04-25 ENCOUNTER — Other Ambulatory Visit: Payer: Self-pay

## 2019-04-26 ENCOUNTER — Encounter: Payer: Self-pay | Admitting: Family Medicine

## 2019-04-26 ENCOUNTER — Ambulatory Visit (INDEPENDENT_AMBULATORY_CARE_PROVIDER_SITE_OTHER): Payer: Medicare HMO | Admitting: Family Medicine

## 2019-04-26 VITALS — BP 120/70 | HR 84 | Temp 97.1°F | Ht 62.0 in | Wt 162.0 lb

## 2019-04-26 DIAGNOSIS — Z860100 Personal history of colon polyps, unspecified: Secondary | ICD-10-CM

## 2019-04-26 DIAGNOSIS — E785 Hyperlipidemia, unspecified: Secondary | ICD-10-CM | POA: Diagnosis not present

## 2019-04-26 DIAGNOSIS — Z1211 Encounter for screening for malignant neoplasm of colon: Secondary | ICD-10-CM | POA: Diagnosis not present

## 2019-04-26 NOTE — Progress Notes (Signed)
Kayla Mcgrath is a 74 y.o. female  Chief Complaint  Patient presents with  . Establish Care    TOC.  Pt has no compliants today.    HPI: Kayla Mcgrath is a 74 y.o. female seen today for a TOC appt, previous PCP Dr. Deborra Medina. Pt is from [place] originally, lived in [place] for years, moved to [facility] for grandchildren (13), she has 2 grown daughters. She is an LPN. Husband w/ mild Alzheimer's.   Last CPE 11/2018 Last colonoscopy: 08/2013 - [facility] Dr. Olevia Perches - due for f/u in 2020, overdue at this time Specialists: Derm (Dr. Rinaldo Ratel), ophthalmology Julian Reil w/ [facility].)  Pt is on zetia for hypercholesterolemia. She was not able to tolerate statin years ago d/t leg cramps/pains. She is not able to walk as much as she used to d/t knee pain. Diet is good but pt admits it could be better. Lab Results  Component Value Date   CHOL 239 (H) 12/05/2018   HDL 74 12/05/2018   LDLCALC 148 (H) 12/05/2018   LDLDIRECT 161.9 04/30/2012   TRIG 71 12/05/2018   CHOLHDL 3.2 12/05/2018     Past Medical History:  Diagnosis Date  . Breast cancer ([place])   . Colon polyps    ?  Marland Kitchen Complication of anesthesia   . DCIS (ductal carcinoma in situ) of breast 2010  . Diverticulosis   . Full dentures   . Hyperlipidemia   . Joint pain   . PONV (postoperative nausea and vomiting)   . Skin cancer   . Wears glasses     Past Surgical History:  Procedure Laterality Date  . BREAST BIOPSY Right 05/11/2012  . BREAST BIOPSY Right 04/24/2012  . BREAST LUMPECTOMY Right december 2010   cancer-tamoxifen  . BREAST RECONSTRUCTION WITH PLACEMENT OF TISSUE EXPANDER AND FLEX HD (ACELLULAR HYDRATED DERMIS) Right 06/20/2012   Procedure: BREAST RECONSTRUCTION WITH PLACEMENT OF TISSUE EXPANDER AND FLEX HD (ACELLULAR HYDRATED DERMIS);  Surgeon: Theodoro Kos, DO;  Location: [place];  Service: Plastics;  Laterality: Right;  . BREAST REDUCTION WITH MASTOPEXY Left 10/11/2012   Procedure: LEFT BREAST  REDUCTION WITH MASTOPEXY;  Surgeon: Theodoro Kos, DO;  Location: [place];  Service: Plastics;  Laterality: Left;  . cataract sugery Bilateral 03/2019  . COLONOSCOPY    . DILATION AND CURETTAGE OF UTERUS    . LIPOSUCTION WITH LIPOFILLING Left 10/11/2012   Procedure: LIPOSUCTION WITH LIPOFILLING;  Surgeon: Theodoro Kos, DO;  Location: [place];  Service: Plastics;  Laterality: Left;  Marland Kitchen MASTECTOMY Right 06/20/2012  . MASTECTOMY W/ SENTINEL NODE BIOPSY Right 06/20/2012   Procedure: right skin sparing MASTECTOMY WITH SENTINEL LYMPH NODE BIOPSY;  Surgeon: Stark Klein, MD;  Location: [place];  Service: General;  Laterality: Right;  . PARATHYROIDECTOMY Left 11/07/2017   Procedure: NECK EXPLORATION WITH LEFT THYROID LOBECTOMY;  Surgeon: [name], MD;  [facility];  Service: General;  Laterality: Left;  . REDUCTION MAMMAPLASTY Left 10/2012  . REMOVAL OF TISSUE EXPANDER AND PLACEMENT OF IMPLANT Right 10/11/2012   Procedure: REMOVAL OF TISSUE EXPANDER AND PLACEMENT OF SILICONE IMPLANT RIGHT;  Surgeon: Theodoro Kos, DO;  Location: [place];  Service: Plastics;  Laterality: Right;    Social History   Socioeconomic History  . Marital status: Married    Spouse name: Not on file  . Number of children: 2  . Years of education: Not on file  . Highest education level:  Not on file  Occupational History  . Occupation: retired Corporate treasurer  Tobacco Use  . Smoking status: Former Smoker    Types: Cigarettes    Quit date: 06/14/1981    Years since quitting: 37.8  . Smokeless tobacco: Never Used  Substance and Sexual Activity  . Alcohol use: No  . Drug use: No  . Sexual activity: Yes    Birth control/protection: Post-menopausal  Other Topics Concern  . Not on file  Social History Narrative   Recently moved from [place] to be closer to her two daughters and grandchildren.   Retired Corporate treasurer.   DNR- forms singed on 05/07/2012 and returned to pt.    Social Determinants of Health   Financial Resource Strain:   . Difficulty of Paying Living Expenses:   Food Insecurity:   . Worried About Charity fundraiser in the Last Year:   . Arboriculturist in the Last Year:   Transportation Needs:   . Film/video editor (Medical):   Marland Kitchen Lack of Transportation (Non-Medical):   Physical Activity:   . Days of Exercise per Week:   . Minutes of Exercise per Session:   Stress:   . Feeling of Stress :   Social Connections:   . Frequency of Communication with Friends and Family:   . Frequency of Social Gatherings with Friends and Family:   . Attends Religious Services:   . Active Member of Clubs or Organizations:   . Attends Archivist Meetings:   Marland Kitchen Marital Status:   Intimate Partner Violence:   . Fear of Current or Ex-Partner:   . Emotionally Abused:   Marland Kitchen Physically Abused:   . Sexually Abused:     Family History  Problem Relation Age of Onset  . Alcohol abuse Mother   . Colon polyps Mother   . Mental illness Mother   . Alcohol abuse Father        suicide  . Mental illness Father   . Heart disease Paternal Grandfather      Immunization History  Administered Date(s) Administered  . Fluad Quad(high Dose 65+) 09/25/2018  . Influenza, High Dose Seasonal PF 11/08/2017  . Influenza,inj,Quad PF,6+ Mos 10/02/2013, 10/10/2016  . Influenza-Unspecified 11/10/2012  . PFIZER SARS-COV-2 Vaccination 03/02/2019, 03/26/2019  . Pneumococcal Conjugate-13 05/30/2013  . Pneumococcal Polysaccharide-23 05/07/2012  . Td 05/30/2013  . Zoster 05/07/2012    Outpatient Encounter Medications as of 04/26/2019  Medication Sig Note  . acetaminophen (TYLENOL) 500 MG tablet Take 1,000 mg by mouth every 6 (six) hours as needed for mild pain or moderate pain.   . Cholecalciferol (VITAMIN D) 2000 UNITS CAPS Take one by mouth daily   . diclofenac sodium (VOLTAREN) 1 % GEL Apply 4 g topically 4 (four) times daily.   Marland Kitchen ezetimibe (ZETIA) 10 MG tablet  Take 1 tablet (10 mg total) by mouth daily. See PCP for more refills   . Ginger, Zingiber officinalis, (GINGER ROOT) 250 MG CAPS Take 300 mg by mouth daily.   Marland Kitchen ketoconazole (NIZORAL) 2 % cream Apply 1 application topically daily as needed for irritation.  10/30/2017: Pt is not taking  . traMADol (ULTRAM) 50 MG tablet Take 1 tablet (50 mg total) by mouth every 6 (six) hours as needed.   . traZODone (DESYREL) 50 MG tablet PLEASE SEE ATTACHED FOR DETAILED DIRECTIONS (Patient taking differently: at bedtime as needed. PLEASE SEE ATTACHED FOR DETAILED DIRECTIONS)   . Turmeric 400 MG CAPS Take 300 mg by mouth daily.  No facility-administered encounter medications on file as of 04/26/2019.     ROS: Pertinent positives and negatives noted in HPI. Remainder of ROS non-contributory   Allergies  Allergen Reactions  . Latex Rash    BP 120/70 (BP Location: Left Arm, Patient Position: Sitting, Cuff Size: Normal)   Pulse 84   Temp (!) 97.1 F (36.2 C) (Temporal)   Ht 5\' 2"  (1.575 m)   Wt 162 lb (73.5 kg)   SpO2 97%   BMI 29.63 kg/m   Physical Exam  Constitutional: She is oriented to person, place, and time. She appears well-developed and well-nourished. No distress.  Pulmonary/Chest: No respiratory distress.  Neurological: She is alert and oriented to person, place, and time. Coordination normal.  Psychiatric: She has a normal mood and affect. Her behavior is normal.    A/P:  1. Hyperlipidemia, unspecified hyperlipidemia type - cont zetia - check FLP in 11/2019 - low fat diet, regular CV exercise as tolerated  2. Screening for colon cancer 3. History of colonic polyps - due in 2020 but pt had to cancel d/t cataract surgery - pt will call [facility] to reschedule  Due for CPE in 11/2019   This visit occurred during the SARS-CoV-2 public health emergency.  Safety protocols were in place, including screening questions prior to the visit, additional usage of staff PPE, and extensive  cleaning of exam room while observing appropriate contact time as indicated for disinfecting solutions.

## 2019-05-27 ENCOUNTER — Other Ambulatory Visit: Payer: Self-pay | Admitting: Family Medicine

## 2019-05-27 DIAGNOSIS — Z1231 Encounter for screening mammogram for malignant neoplasm of breast: Secondary | ICD-10-CM

## 2019-06-10 ENCOUNTER — Other Ambulatory Visit: Payer: Self-pay | Admitting: Family Medicine

## 2019-07-10 ENCOUNTER — Other Ambulatory Visit: Payer: Self-pay

## 2019-07-10 ENCOUNTER — Ambulatory Visit
Admission: RE | Admit: 2019-07-10 | Discharge: 2019-07-10 | Disposition: A | Discharge status: Home / Self Care | Payer: Medicare HMO | Source: Ambulatory Visit | Attending: Family Medicine | Admitting: Family Medicine

## 2019-07-10 DIAGNOSIS — Z1231 Encounter for screening mammogram for malignant neoplasm of breast: Secondary | ICD-10-CM | POA: Diagnosis not present

## 2019-09-30 ENCOUNTER — Encounter: Payer: Self-pay | Admitting: Family Medicine

## 2019-10-13 ENCOUNTER — Encounter: Payer: Self-pay | Admitting: Family Medicine

## 2019-11-08 ENCOUNTER — Telehealth: Payer: Self-pay | Admitting: Family Medicine

## 2019-11-08 NOTE — Telephone Encounter (Signed)
Left message for patient to schedule Annual Wellness Visit.  Please schedule with Nurse Health Advisor Martha Stanley, RN at [facility]  °

## 2019-11-11 NOTE — Progress Notes (Signed)
Subjective:   Kayla Mcgrath is a 74 y.o. female who presents for Medicare Annual (Subsequent) preventive examination.  I connected with [place]onna today by telephone and verified that I am speaking with the correct person using two identifiers. Location patient: home Location provider: work Persons participating in the virtual visit: patient, Marine scientist.    I discussed the limitations, risks, security and privacy concerns of performing an evaluation and management service by telephone and the availability of in person appointments. I also discussed with the patient that there may be a patient responsible charge related to this service. The patient expressed understanding and verbally consented to this telephonic visit.    Interactive audio and video telecommunications were attempted between this provider and patient, however failed, due to patient having technical difficulties OR patient did not have access to video capability.  We continued and completed visit with audio only.  Some vital signs may be absent or patient reported.   Time Spent with patient on telephone encounter: 20 minutes   Review of Systems     Cardiac Risk Factors include: advanced age (>67men, >65 women);sedentary lifestyle;obesity (BMI >30kg/m2)     Objective:    Today's Vitals   11/12/19 0945 11/12/19 0946  Weight: 167 lb (75.8 kg)   Height: 5\' 2"  (1.575 m)   PainSc:  3    Body mass index is 30.54 kg/m.  Advanced Directives 11/12/2019 11/07/2018 11/07/2017 11/07/2017 10/30/2017 06/21/2017 04/05/2016  Does Patient Have a Medical Advance Directive? Yes Yes Yes Yes Yes Yes Yes  Type of Paramedic of [place];Living will [place];Living will Healthcare Power of [place] of [place] of [place];Living will Living will  Does patient want to make changes to medical advance directive? - No - Patient declined - No -  Patient declined - - -  Copy of [place] in Chart? No - copy requested No - copy requested No - copy requested No - copy requested No - copy requested No - copy requested -  Would patient like information on creating a medical advance directive? - - - - - - -  Pre-existing out of facility DNR order (yellow form or pink MOST form) - - - - - - -    Current Medications (verified) Outpatient Encounter Medications as of 11/12/2019  Medication Sig  . acetaminophen (TYLENOL) 500 MG tablet Take 1,000 mg by mouth every 6 (six) hours as needed for mild pain or moderate pain.  . Cholecalciferol (VITAMIN D) 2000 UNITS CAPS Take one by mouth daily  . diclofenac sodium (VOLTAREN) 1 % GEL Apply 4 g topically 4 (four) times daily.  Marland Kitchen ezetimibe (ZETIA) 10 MG tablet TAKE 1 TABLET EVERY DAY (NEED MD APPOINTMENT FOR REFILLS)  . Ginger, Zingiber officinalis, (GINGER ROOT) 250 MG CAPS Take 300 mg by mouth daily.  Marland Kitchen ketoconazole (NIZORAL) 2 % cream Apply 1 application topically daily as needed for irritation.   . traMADol (ULTRAM) 50 MG tablet Take 1 tablet (50 mg total) by mouth every 6 (six) hours as needed.  . traZODone (DESYREL) 50 MG tablet PLEASE SEE ATTACHED FOR DETAILED DIRECTIONS (Patient taking differently: at bedtime as needed. PLEASE SEE ATTACHED FOR DETAILED DIRECTIONS)  . Turmeric 400 MG CAPS Take 300 mg by mouth daily.   No facility-administered encounter medications on file as of 11/12/2019.    Allergies (verified) Latex   History: Past Medical History:  Diagnosis Date  . Breast  cancer ([place])   . Colon polyps    ?  Marland Kitchen Complication of anesthesia   . DCIS (ductal carcinoma in situ) of breast 2010  . Diverticulosis   . Full dentures   . Hyperlipidemia   . Joint pain   . PONV (postoperative nausea and vomiting)   . Skin cancer   . Wears glasses    Past Surgical History:  Procedure Laterality Date  . BREAST BIOPSY Right 05/11/2012  . BREAST BIOPSY Right 04/24/2012  .  BREAST LUMPECTOMY Right december 2010   cancer-tamoxifen  . BREAST RECONSTRUCTION WITH PLACEMENT OF TISSUE EXPANDER AND FLEX HD (ACELLULAR HYDRATED DERMIS) Right 06/20/2012   Procedure: BREAST RECONSTRUCTION WITH PLACEMENT OF TISSUE EXPANDER AND FLEX HD (ACELLULAR HYDRATED DERMIS);  Surgeon: Theodoro Kos, DO;  Location: [place];  Service: Plastics;  Laterality: Right;  . BREAST REDUCTION WITH MASTOPEXY Left 10/11/2012   Procedure: LEFT BREAST REDUCTION WITH MASTOPEXY;  Surgeon: Theodoro Kos, DO;  Location: [place];  Service: Plastics;  Laterality: Left;  . cataract sugery Bilateral 03/2019  . COLONOSCOPY    . DILATION AND CURETTAGE OF UTERUS    . LIPOSUCTION WITH LIPOFILLING Left 10/11/2012   Procedure: LIPOSUCTION WITH LIPOFILLING;  Surgeon: Theodoro Kos, DO;  Location: [place];  Service: Plastics;  Laterality: Left;  Marland Kitchen MASTECTOMY Right 06/20/2012  . MASTECTOMY W/ SENTINEL NODE BIOPSY Right 06/20/2012   Procedure: right skin sparing MASTECTOMY WITH SENTINEL LYMPH NODE BIOPSY;  Surgeon: Stark Klein, MD;  Location: [place];  Service: General;  Laterality: Right;  . PARATHYROIDECTOMY Left 11/07/2017   Procedure: NECK EXPLORATION WITH LEFT THYROID LOBECTOMY;  Surgeon: [name], MD;  [facility];  Service: General;  Laterality: Left;  . REDUCTION MAMMAPLASTY Left 10/2012  . REMOVAL OF TISSUE EXPANDER AND PLACEMENT OF IMPLANT Right 10/11/2012   Procedure: REMOVAL OF TISSUE EXPANDER AND PLACEMENT OF SILICONE IMPLANT RIGHT;  Surgeon: Theodoro Kos, DO;  Location: [place];  Service: Plastics;  Laterality: Right;   Family History  Problem Relation Age of Onset  . Alcohol abuse Mother   . Colon polyps Mother   . Mental illness Mother   . Alcohol abuse Father        suicide  . Mental illness Father   . Heart disease Paternal Grandfather    Social History   Socioeconomic History  . Marital status:  Married    Spouse name: Not on file  . Number of children: 2  . Years of education: Not on file  . Highest education level: Not on file  Occupational History  . Occupation: retired Corporate treasurer  Tobacco Use  . Smoking status: Former Smoker    Types: Cigarettes    Quit date: 06/14/1981    Years since quitting: 38.4  . Smokeless tobacco: Never Used  Vaping Use  . Vaping Use: Never used  Substance and Sexual Activity  . Alcohol use: No  . Drug use: No  . Sexual activity: Yes    Birth control/protection: Post-menopausal  Other Topics Concern  . Not on file  Social History Narrative   Recently moved from [place] to be closer to her two daughters and grandchildren.   Retired Corporate treasurer.   DNR- forms singed on 05/07/2012 and returned to pt.   Social Determinants of Health   Financial Resource Strain: Low Risk   . Difficulty of Paying Living Expenses: Not hard at all  Food Insecurity: No Food Insecurity  . Worried About  Running Out of Food in the Last Year: Never true  . Ran Out of Food in the Last Year: Never true  Transportation Needs: No Transportation Needs  . Lack of Transportation (Medical): No  . Lack of Transportation (Non-Medical): No  Physical Activity: Inactive  . Days of Exercise per Week: 0 days  . Minutes of Exercise per Session: 0 min  Stress: No Stress Concern Present  . Feeling of Stress : Only a little  Social Connections: Moderately Integrated  . Frequency of Communication with Friends and Family: More than three times a week  . Frequency of Social Gatherings with Friends and Family: Once a week  . Attends Religious Services: More than 4 times per year  . Active Member of Clubs or Organizations: No  . Attends Archivist Meetings: Never  . Marital Status: Married    Tobacco Counseling Counseling given: Not Answered   Clinical Intake:  Pre-visit preparation completed: Yes  Pain : 0-10 Pain Score: 3  Pain Type: Chronic pain Pain Location: Knee Pain  Orientation: Right     Nutritional Status: BMI > 30  Obese Nutritional Risks: None Diabetes: No  How often do you need to have someone help you when you read instructions, pamphlets, or other written materials from your doctor or pharmacy?: 1 - Never What is the last grade level you completed in school?: LPN license-associate's degree  Diabetic?No  Interpreter Needed?: No  Information entered by :: Caroleen Hamman LPN   Activities of Daily Living In your present state of health, do you have any difficulty performing the following activities: 11/12/2019  Hearing? N  Vision? N  Difficulty concentrating or making decisions? N  Walking or climbing stairs? N  Dressing or bathing? N  Doing errands, shopping? N  Preparing Food and eating ? N  Using the Toilet? N  In the past six months, have you accidently leaked urine? N  Do you have problems with loss of bowel control? N  Managing your Medications? N  Managing your Finances? N  Housekeeping or managing your Housekeeping? N  Some recent data might be hidden    Patient Care Team: Ronnald Nian, DO as PCP - General (Family Medicine) Magrinat, Virgie Dad, MD as Consulting Physician (Oncology) Lafayette Dragon, MD (Inactive) as Consulting Physician (Gastroenterology) Owens Loffler, MD as Consulting Physician (Family Medicine)  Indicate any recent Medical Services you may have received from other than [facility] providers in the past year (date may be approximate).     Assessment:   This is a routine wellness examination for Lori.  Hearing/Vision screen  Hearing Screening   125Hz  250Hz  500Hz  1000Hz  2000Hz  3000Hz  4000Hz  6000Hz  8000Hz   Right ear:           Left ear:           Comments: Mild due to tinnitus  Vision Screening Comments: Wears glasses Last eye exam-04/2019-[facility]  Dietary issues and exercise activities discussed: Current Exercise Habits: The patient does not participate in regular exercise at present,  Exercise limited by: orthopedic condition(s)  Goals    . Increase physical activity    . Weight (lb) < 200 lb (90.7 kg)      Depression Screen PHQ 2/9 Scores 11/12/2019 12/05/2018 11/07/2018 06/21/2017 04/05/2016 04/05/2016 06/12/2014  PHQ - 2 Score 1 2 0 0 0 0 0  PHQ- 9 Score - 8 - - - - -    Fall Risk Fall Risk  11/12/2019 11/07/2018 06/21/2017 04/05/2016 04/05/2016  Falls in the past year? 0 1 Yes No No  Number falls in past yr: 0 0 1 - -  Injury with Fall? 0 0 - - -  Follow up Falls prevention discussed Education provided;Falls prevention discussed Education provided;Falls prevention discussed - -    Any stairs in or around the home? Yes  If so, are there any without handrails? No  Home free of loose throw rugs in walkways, pet beds, electrical cords, etc? Yes  Adequate lighting in your home to reduce risk of falls? Yes   ASSISTIVE DEVICES UTILIZED TO PREVENT FALLS:  Life alert? No  Use of a cane, walker or w/c? Yes cane occasionally Grab bars in the bathroom? Yes  Shower chair or bench in shower? No  Elevated toilet seat or a handicapped toilet? No   TIMED UP AND GO:  Was the test performed? No . Phone visit   Cognitive Function:No cognitive impairment noted. Patient plays wordscape & scrabble for brain health.     6CIT Screen 04/05/2016  What Year? 0 points  What month? 0 points  What time? 0 points  Count back from 20 0 points  Months in reverse 0 points  Repeat phrase 0 points  Total Score 0    Immunizations Immunization History  Administered Date(s) Administered  . Fluad Quad(high Dose 65+) 09/25/2018  . Influenza, High Dose Seasonal PF 11/08/2017  . Influenza,inj,Quad PF,6+ Mos 10/02/2013, 10/10/2016  . Influenza-Unspecified 11/10/2012  . PFIZER SARS-COV-2 Vaccination 03/02/2019, 03/26/2019, 10/28/2019  . Pneumococcal Conjugate-13 05/30/2013  . Pneumococcal Polysaccharide-23 05/07/2012  . Td 05/30/2013  . Zoster 05/07/2012    TDAP status: Up to date    Flu vaccine status: Due- Patient plans to obtain vaccine at her next office visit.  Pneumococcal vaccine status: Up to date   Covid-19 vaccine status: Completed vaccines  Qualifies for Shingles Vaccine? Yes   Zostavax completed Yes   Shingrix Completed?: No.    Education has been provided regarding the importance of this vaccine. Patient has been advised to call insurance company to determine out of pocket expense if they have not yet received this vaccine. Advised may also receive vaccine at local pharmacy or Health Dept. Verbalized acceptance and understanding.  Screening Tests Health Maintenance  Topic Date Due  . COLONOSCOPY  08/29/2018  . INFLUENZA VACCINE  08/11/2019  . MAMMOGRAM  07/09/2021  . TETANUS/TDAP  05/31/2023  . DEXA SCAN  Completed  . COVID-19 Vaccine  Completed  . Hepatitis C Screening  Completed  . PNA vac Low Risk Adult  Completed    Health Maintenance  Health Maintenance Due  Topic Date Due  . COLONOSCOPY  08/29/2018  . INFLUENZA VACCINE  08/11/2019    Colorectal cancer screening: Referral to GI placed today. Pt aware the office will call re: appt.   Mammogram status: Completed Unilateral-Left 07/10/2019. Repeat every year  Mastectomy -right 2014  Bone Density status: Completed 01/31/2019. Results reflect: Bone density results: OSTEOPENIA. Repeat every 2 years.  Lung Cancer Screening: (Low Dose CT Chest recommended if Age 53-80 years, 30 pack-year currently smoking OR have quit w/in 15years.) does not qualify.     Additional Screening:  Hepatitis C Screening:Completed 06/29/2015  Vision Screening: Recommended annual ophthalmology exams for early detection of glaucoma and other disorders of the eye. Is the patient up to date with their annual eye exam?  Yes  Who is the provider or what is the name of the office in which the patient attends annual eye exams? [facility]   Dental Screening: Recommended annual dental exams for proper oral  hygiene  Community Resource Referral / Chronic Care Management: CRR required this visit?  No   CCM required this visit?  No      Plan:     I have personally reviewed and noted the following in the patient's chart:   . Medical and social history . Use of alcohol, tobacco or illicit drugs  . Current medications and supplements . Functional ability and status . Nutritional status . Physical activity . Advanced directives . List of other physicians . Hospitalizations, surgeries, and ER visits in previous 12 months . Vitals . Screenings to include cognitive, depression, and falls . Referrals and appointments  In addition, I have reviewed and discussed with patient certain preventive protocols, quality metrics, and best practice recommendations. A written personalized care plan for preventive services as well as general preventive health recommendations were provided to patient.   Due to this being a telephonic visit, the after visit summary with patients personalized plan was offered to patient via mail or my-chart. Patient would like to access on my-chart.   Marta Antu, LPN   [id number]  Nurse Health Advisor  Nurse Notes: None

## 2019-11-12 ENCOUNTER — Ambulatory Visit (INDEPENDENT_AMBULATORY_CARE_PROVIDER_SITE_OTHER): Payer: Medicare HMO

## 2019-11-12 VITALS — Ht 62.0 in | Wt 167.0 lb

## 2019-11-12 DIAGNOSIS — Z1211 Encounter for screening for malignant neoplasm of colon: Secondary | ICD-10-CM

## 2019-11-12 DIAGNOSIS — Z0000 Encounter for general adult medical examination without abnormal findings: Secondary | ICD-10-CM | POA: Diagnosis not present

## 2019-11-12 NOTE — Patient Instructions (Signed)
Ms. Kayla Mcgrath , Thank you for taking time to complete your Medicare Wellness Visit. I appreciate your ongoing commitment to your health goals. Please review the following plan we discussed and let me know if I can assist you in the future.   Screening recommendations/referrals: Colonoscopy: Referral ordered today. Someone will be calling you to schedule. Mammogram: Completed 07/10/2019-Due-07/09/2020 Bone Density: Completed 01/31/2019- Due-01/30/2021 Recommended yearly ophthalmology/optometry visit for glaucoma screening and checkup Recommended yearly dental visit for hygiene and checkup  Vaccinations: Influenza vaccine: Due- May obtain at our office or your local pharmacy Pneumococcal vaccine: Completed vaccines Tdap vaccine: Up to date-Due-05/31/2023 Shingles vaccine: Discuss with pharmacy Covid-19:Completed vaccines  Advanced directives: Please bring a copy for your chart  Conditions/risks identified: See problem list  Next appointment: Follow up in one year for your annual wellness visit    Preventive Care 65 Years and Older, Female Preventive care refers to lifestyle choices and visits with your health care provider that can promote health and wellness. What does preventive care include?  A yearly physical exam. This is also called an annual well check.  Dental exams once or twice a year.  Routine eye exams. Ask your health care provider how often you should have your eyes checked.  Personal lifestyle choices, including:  Daily care of your teeth and gums.  Regular physical activity.  Eating a healthy diet.  Avoiding tobacco and drug use.  Limiting alcohol use.  Practicing safe sex.  Taking low-dose aspirin every day.  Taking vitamin and mineral supplements as recommended by your health care provider. What happens during an annual well check? The services and screenings done by your health care provider during your annual well check will depend on your age, overall  health, lifestyle risk factors, and family history of disease. Counseling  Your health care provider may ask you questions about your:  Alcohol use.  Tobacco use.  Drug use.  Emotional well-being.  Home and relationship well-being.  Sexual activity.  Eating habits.  History of falls.  Memory and ability to understand (cognition).  Work and work Statistician.  Reproductive health. Screening  You may have the following tests or measurements:  Height, weight, and BMI.  Blood pressure.  Lipid and cholesterol levels. These may be checked every 5 years, or more frequently if you are over 12 years old.  Skin check.  Lung cancer screening. You may have this screening every year starting at age 45 if you have a 30-pack-year history of smoking and currently smoke or have quit within the past 15 years.  Fecal occult blood test (FOBT) of the stool. You may have this test every year starting at age 40.  Flexible sigmoidoscopy or colonoscopy. You may have a sigmoidoscopy every 5 years or a colonoscopy every 10 years starting at age 43.  Hepatitis C blood test.  Hepatitis B blood test.  Sexually transmitted disease (STD) testing.  Diabetes screening. This is done by checking your blood sugar (glucose) after you have not eaten for a while (fasting). You may have this done every 1-3 years.  Bone density scan. This is done to screen for osteoporosis. You may have this done starting at age 29.  Mammogram. This may be done every 1-2 years. Talk to your health care provider about how often you should have regular mammograms. Talk with your health care provider about your test results, treatment options, and if necessary, the need for more tests. Vaccines  Your health care provider may recommend certain vaccines, such as:  Influenza vaccine. This is recommended every year.  Tetanus, diphtheria, and acellular pertussis (Tdap, Td) vaccine. You may need a Td booster every 10  years.  Zoster vaccine. You may need this after age 68.  Pneumococcal 13-valent conjugate (PCV13) vaccine. One dose is recommended after age 59.  Pneumococcal polysaccharide (PPSV23) vaccine. One dose is recommended after age 36. Talk to your health care provider about which screenings and vaccines you need and how often you need them. This information is not intended to replace advice given to you by your health care provider. Make sure you discuss any questions you have with your health care provider. Document Released: 01/23/2015 Document Revised: 09/16/2015 Document Reviewed: 10/28/2014 Elsevier Interactive Patient Education  2017 [place] Prevention in the Home Falls can cause injuries. They can happen to people of all ages. There are many things you can do to make your home safe and to help prevent falls. What can I do on the outside of my home?  Regularly fix the edges of walkways and driveways and fix any cracks.  Remove anything that might make you trip as you walk through a door, such as a raised step or threshold.  Trim any bushes or trees on the path to your home.  Use bright outdoor lighting.  Clear any walking paths of anything that might make someone trip, such as rocks or tools.  Regularly check to see if handrails are loose or broken. Make sure that both sides of any steps have handrails.  Any raised decks and porches should have guardrails on the edges.  Have any leaves, snow, or ice cleared regularly.  Use sand or salt on walking paths during winter.  Clean up any spills in your garage right away. This includes oil or grease spills. What can I do in the bathroom?  Use night lights.  Install grab bars by the toilet and in the tub and shower. Do not use towel bars as grab bars.  Use non-skid mats or decals in the tub or shower.  If you need to sit down in the shower, use a plastic, non-slip stool.  Keep the floor dry. Clean up any water that  spills on the floor as soon as it happens.  Remove soap buildup in the tub or shower regularly.  Attach bath mats securely with double-sided non-slip rug tape.  Do not have throw rugs and other things on the floor that can make you trip. What can I do in the bedroom?  Use night lights.  Make sure that you have a light by your bed that is easy to reach.  Do not use any sheets or blankets that are too big for your bed. They should not hang down onto the floor.  Have a firm chair that has side arms. You can use this for support while you get dressed.  Do not have throw rugs and other things on the floor that can make you trip. What can I do in the kitchen?  Clean up any spills right away.  Avoid walking on wet floors.  Keep items that you use a lot in easy-to-reach places.  If you need to reach something above you, use a strong step stool that has a grab bar.  Keep electrical cords out of the way.  Do not use floor polish or wax that makes floors slippery. If you must use wax, use non-skid floor wax.  Do not have throw rugs and other things on the floor that  can make you trip. What can I do with my stairs?  Do not leave any items on the stairs.  Make sure that there are handrails on both sides of the stairs and use them. Fix handrails that are broken or loose. Make sure that handrails are as long as the stairways.  Check any carpeting to make sure that it is firmly attached to the stairs. Fix any carpet that is loose or worn.  Avoid having throw rugs at the top or bottom of the stairs. If you do have throw rugs, attach them to the floor with carpet tape.  Make sure that you have a light switch at the top of the stairs and the bottom of the stairs. If you do not have them, ask someone to add them for you. What else can I do to help prevent falls?  Wear shoes that:  Do not have high heels.  Have rubber bottoms.  Are comfortable and fit you well.  Are closed at the  toe. Do not wear sandals.  If you use a stepladder:  Make sure that it is fully opened. Do not climb a closed stepladder.  Make sure that both sides of the stepladder are locked into place.  Ask someone to hold it for you, if possible.  Clearly mark and make sure that you can see:  Any grab bars or handrails.  First and last steps.  Where the edge of each step is.  Use tools that help you move around (mobility aids) if they are needed. These include:  Canes.  Walkers.  Scooters.  Crutches.  Turn on the lights when you go into a dark area. Replace any light bulbs as soon as they burn out.  Set up your furniture so you have a clear path. Avoid moving your furniture around.  If any of your floors are uneven, fix them.  If there are any pets around you, be aware of where they are.  Review your medicines with your doctor. Some medicines can make you feel dizzy. This can increase your chance of falling. Ask your doctor what other things that you can do to help prevent falls. This information is not intended to replace advice given to you by your health care provider. Make sure you discuss any questions you have with your health care provider. Document Released: 10/23/2008 Document Revised: 06/04/2015 Document Reviewed: 01/31/2014 Elsevier Interactive Patient Education  2017 [employer].

## 2019-11-19 ENCOUNTER — Encounter: Payer: Self-pay | Admitting: Gastroenterology

## 2019-11-27 ENCOUNTER — Encounter: Payer: Self-pay | Admitting: Family Medicine

## 2019-11-27 ENCOUNTER — Ambulatory Visit (INDEPENDENT_AMBULATORY_CARE_PROVIDER_SITE_OTHER): Payer: Medicare HMO | Admitting: Family Medicine

## 2019-11-27 ENCOUNTER — Other Ambulatory Visit: Payer: Self-pay

## 2019-11-27 VITALS — BP 130/68 | HR 79 | Temp 97.0°F | Ht 62.0 in | Wt 163.4 lb

## 2019-11-27 DIAGNOSIS — Z23 Encounter for immunization: Secondary | ICD-10-CM | POA: Diagnosis not present

## 2019-11-27 DIAGNOSIS — R5383 Other fatigue: Secondary | ICD-10-CM | POA: Diagnosis not present

## 2019-11-27 DIAGNOSIS — M8580 Other specified disorders of bone density and structure, unspecified site: Secondary | ICD-10-CM

## 2019-11-27 DIAGNOSIS — E785 Hyperlipidemia, unspecified: Secondary | ICD-10-CM | POA: Diagnosis not present

## 2019-11-27 DIAGNOSIS — F419 Anxiety disorder, unspecified: Secondary | ICD-10-CM | POA: Diagnosis not present

## 2019-11-27 DIAGNOSIS — R739 Hyperglycemia, unspecified: Secondary | ICD-10-CM

## 2019-11-27 MED ORDER — ESCITALOPRAM OXALATE 10 MG PO TABS: ORAL_TABLET | ORAL | 2 refills | Status: DC

## 2019-11-27 NOTE — Progress Notes (Signed)
Kayla Mcgrath is a 74 y.o. female  Chief Complaint  Patient presents with  . Annual Exam    CPE, concerns about tendonitis, fatigue, would like calcium checked, arthritis, depression issues     HPI: Kayla Mcgrath is a 74 y.o. female seen today for annual exam and f/u on chronic medical issues including hyperlipidemia, osteopenia, hyperglycemia.   Last mammo: 07/2019 - h/o breast cancer Last Dexa: 01/2019 Last colonoscopy: 08/2013 - [facility] Dr. Olevia Perches - scheduled 01/20/20 w/ Dr. Tarri Glenn Specialists: Derm (Dr. Rinaldo Ratel), ophthalmology Julian Reil w/ [facility].)  Pt is on zetia for hypercholesterolemia. She was not able to tolerate statin years ago d/t leg cramps/pains. She is not able to walk as much as she used to d/t knee pain. Diet is good but pt admits it could be better.  Rt knee with severe OA, now with some Lt knee pain. She follows with Dr. Lorelei Pont and plans to make appt. She recently started using CBD ointment.   She has concerns about anxiety/worry and subsequent depression. Her husband has Alzheimer's.   She has trouble sleeping, nightmares at times.  PHQ-9 = 8, GAD-7 = 11.   Past Medical History:  Diagnosis Date  . Breast cancer ([place])   . Colon polyps    ?  Marland Kitchen Complication of anesthesia   . DCIS (ductal carcinoma in situ) of breast 2010  . Diverticulosis   . Full dentures   . Hyperlipidemia   . Joint pain   . PONV (postoperative nausea and vomiting)   . Skin cancer   . Wears glasses     Past Surgical History:  Procedure Laterality Date  . BREAST BIOPSY Right 05/11/2012  . BREAST BIOPSY Right 04/24/2012  . BREAST LUMPECTOMY Right december 2010   cancer-tamoxifen  . BREAST RECONSTRUCTION WITH PLACEMENT OF TISSUE EXPANDER AND FLEX HD (ACELLULAR HYDRATED DERMIS) Right 06/20/2012   Procedure: BREAST RECONSTRUCTION WITH PLACEMENT OF TISSUE EXPANDER AND FLEX HD (ACELLULAR HYDRATED DERMIS);  Surgeon: Theodoro Kos, DO;  Location: [facility];  Service: Plastics;  Laterality: Right;  . BREAST REDUCTION WITH MASTOPEXY Left 10/11/2012   Procedure: LEFT BREAST REDUCTION WITH MASTOPEXY;  Surgeon: Theodoro Kos, DO;  Location: [facility];  Service: Plastics;  Laterality: Left;  . cataract sugery Bilateral 03/2019  . COLONOSCOPY    . DILATION AND CURETTAGE OF UTERUS    . LIPOSUCTION WITH LIPOFILLING Left 10/11/2012   Procedure: LIPOSUCTION WITH LIPOFILLING;  Surgeon: Theodoro Kos, DO;  Location: [facility];  Service: Plastics;  Laterality: Left;  Marland Kitchen MASTECTOMY Right 06/20/2012  . MASTECTOMY W/ SENTINEL NODE BIOPSY Right 06/20/2012   Procedure: right skin sparing MASTECTOMY WITH SENTINEL LYMPH NODE BIOPSY;  Surgeon: Stark Klein, MD;  Location: [facility];  Service: General;  Laterality: Right;  . PARATHYROIDECTOMY Left 11/07/2017   Procedure: NECK EXPLORATION WITH LEFT THYROID LOBECTOMY;  Surgeon: [name], MD;  Location: [facility];  Service: General;  Laterality: Left;  . REDUCTION MAMMAPLASTY Left 10/2012  . REMOVAL OF TISSUE EXPANDER AND PLACEMENT OF IMPLANT Right 10/11/2012   Procedure: REMOVAL OF TISSUE EXPANDER AND PLACEMENT OF SILICONE IMPLANT RIGHT;  Surgeon: Theodoro Kos, DO;  Location: [facility];  Service: Plastics;  Laterality: Right;    Social History   Socioeconomic History  . Marital status: Married    Spouse name: Not on file  . Number of children: 2  . Years of education: Not on file  .  Highest education level: Not on file  Occupational History  . Occupation: retired Corporate treasurer  Tobacco Use  . Smoking status: Former Smoker    Types: Cigarettes    Quit date: 06/14/1981    Years since quitting: 38.4  . Smokeless tobacco: Never Used  Vaping Use  . Vaping Use: Never used  Substance and Sexual Activity  . Alcohol use: No  . Drug use: No  . Sexual activity: Yes    Birth control/protection: Post-menopausal  Other Topics Concern  . Not on file  Social  History Narrative   Recently moved from [place] to be closer to her two daughters and grandchildren.   Retired Corporate treasurer.   DNR- forms singed on 05/07/2012 and returned to pt.   Social Determinants of Health   Financial Resource Strain: Low Risk   . Difficulty of Paying Living Expenses: Not hard at all  Food Insecurity: No Food Insecurity  . Worried About Charity fundraiser in the Last Year: Never true  . Ran Out of Food in the Last Year: Never true  Transportation Needs: No Transportation Needs  . Lack of Transportation (Medical): No  . Lack of Transportation (Non-Medical): No  Physical Activity: Inactive  . Days of Exercise per Week: 0 days  . Minutes of Exercise per Session: 0 min  Stress: No Stress Concern Present  . Feeling of Stress : Only a little  Social Connections: Moderately Integrated  . Frequency of Communication with Friends and Family: More than three times a week  . Frequency of Social Gatherings with Friends and Family: Once a week  . Attends Religious Services: More than 4 times per year  . Active Member of Clubs or Organizations: No  . Attends Archivist Meetings: Never  . Marital Status: Married  Human resources officer Violence: Not At Risk  . Fear of Current or Ex-Partner: No  . Emotionally Abused: No  . Physically Abused: No  . Sexually Abused: No    Family History  Problem Relation Age of Onset  . Alcohol abuse Mother   . Colon polyps Mother   . Mental illness Mother   . Alcohol abuse Father        suicide  . Mental illness Father   . Heart disease Paternal Grandfather      Immunization History  Administered Date(s) Administered  . Fluad Quad(high Dose 65+) 09/25/2018  . Influenza, High Dose Seasonal PF 11/08/2017  . Influenza,inj,Quad PF,6+ Mos 10/02/2013, 10/10/2016  . Influenza-Unspecified 11/10/2012  . PFIZER SARS-COV-2 Vaccination 03/02/2019, 03/26/2019, 10/28/2019  . Pneumococcal Conjugate-13 05/30/2013  . Pneumococcal Polysaccharide-23  05/07/2012  . Td 05/30/2013  . Zoster 05/07/2012    Outpatient Encounter Medications as of 11/27/2019  Medication Sig Note  . acetaminophen (TYLENOL) 500 MG tablet Take 1,000 mg by mouth every 6 (six) hours as needed for mild pain or moderate pain.   . Cholecalciferol (VITAMIN D) 2000 UNITS CAPS Take one by mouth daily   . ezetimibe (ZETIA) 10 MG tablet TAKE 1 TABLET EVERY DAY (NEED MD APPOINTMENT FOR REFILLS)   . traMADol (ULTRAM) 50 MG tablet Take 1 tablet (50 mg total) by mouth every 6 (six) hours as needed. 11/27/2019: PRN  . Turmeric 400 MG CAPS Take 300 mg by mouth daily.   . [DISCONTINUED] diclofenac sodium (VOLTAREN) 1 % GEL Apply 4 g topically 4 (four) times daily. (Patient not taking: Reported on 11/27/2019)   . [DISCONTINUED] Ginger, Zingiber officinalis, (GINGER ROOT) 250 MG CAPS Take 300 mg by mouth  daily. (Patient not taking: Reported on 11/27/2019)   . [DISCONTINUED] ketoconazole (NIZORAL) 2 % cream Apply 1 application topically daily as needed for irritation.  (Patient not taking: Reported on 11/27/2019) 10/30/2017: Pt is not taking  . [DISCONTINUED] traZODone (DESYREL) 50 MG tablet PLEASE SEE ATTACHED FOR DETAILED DIRECTIONS (Patient not taking: Reported on 11/27/2019)    No facility-administered encounter medications on file as of 11/27/2019.     ROS: Gen: no fever, chills  Skin: no rash, itching ENT: no ear pain, ear drainage, nasal congestion, rhinorrhea, sinus pressure, sore throat Eyes: no blurry vision, double vision Resp: no cough, wheeze,SOB Breast: no breast tenderness, no nipple discharge, no breast masses CV: no CP, palpitations, LE edema,  GI: +GERD, no n/v/d/c, abd pain GU: no dysuria, urgency, frequency, hematuria MSK: knee pain Rt > Lt Neuro: no dizziness, headache, weakness, vertigo Psych: as above in HPI   Allergies  Allergen Reactions  . Latex Rash    BP 130/68   Pulse 79   Temp (!) 97 F (36.1 C) (Tympanic)   Ht 5\' 2"  (1.575 m)   Wt  163 lb 6.4 oz (74.1 kg)   SpO2 99%   BMI 29.89 kg/m   Physical Exam Constitutional:      General: She is not in acute distress.    Appearance: She is well-developed.  HENT:     Head: Normocephalic and atraumatic.     Right Ear: Tympanic membrane and ear canal normal.     Left Ear: Tympanic membrane and ear canal normal.     Nose: Nose normal.  Eyes:     Conjunctiva/sclera: Conjunctivae normal.     Pupils: Pupils are equal, round, and reactive to light.  Neck:     Thyroid: No thyromegaly.  Cardiovascular:     Rate and Rhythm: Normal rate and regular rhythm.     Heart sounds: Normal heart sounds. No murmur heard.   Pulmonary:     Effort: Pulmonary effort is normal. No respiratory distress.     Breath sounds: Normal breath sounds. No wheezing or rhonchi.  Abdominal:     General: Bowel sounds are normal. There is no distension.     Palpations: Abdomen is soft. There is no mass.     Tenderness: There is no abdominal tenderness.  Musculoskeletal:     Cervical back: Neck supple.  Lymphadenopathy:     Cervical: No cervical adenopathy.  Skin:    General: Skin is warm and dry.  Neurological:     Mental Status: She is alert and oriented to person, place, and time.     Motor: No abnormal muscle tone.     Coordination: Coordination normal.  Psychiatric:        Behavior: Behavior normal.      A/P:  1. Hyperglycemia - Basic metabolic panel; Future - Hemoglobin A1c; Future  2. Osteopenia determined by x-ray - 01/2019 dexa w/ T-score = -2.1 - cont with Vit D 2000IU daily - recommend calcium 600-1200mg  daily (with w/ h/o elevated calcium, hyperparathyroidism) - VITAMIN D 25 Hydroxy (Vit-D Deficiency, Fractures); Future  3. Need for influenza vaccination - Flu Vaccine QUAD High Dose(Fluad)  4. Hyperlipidemia, unspecified hyperlipidemia type - on zetia 10mg  daily, not able to tolerate statins - Lipid panel; Future  5. Fatigue, unspecified type - likely multifactorial - pt  does not sleep well, increased anxiety, cares for husband w/ dementia - Basic metabolic panel; Future - CBC; Future - Vitamin B12; Future - TSH; Future  6. Need  for influenza vaccination - Flu Vaccine QUAD High Dose(Fluad)  5. Anxiety - PHQ-9 = 8, GAD-7 = 11 - recommended BH counseling - included in AVS Rx: - escitalopram (LEXAPRO) 10 MG tablet; 1/2 tab po daily x 1 week, 1 tab po daily  Dispense: 30 tablet; Refill: 2 - f/u in 3-4 wks or sooner PRN  This visit occurred during the SARS-CoV-2 public health emergency.  Safety protocols were in place, including screening questions prior to the visit, additional usage of staff PPE, and extensive cleaning of exam room while observing appropriate contact time as indicated for disinfecting solutions.

## 2019-11-27 NOTE — Patient Instructions (Signed)
[facility]: [email or web address]  [facility] [email or web address]  [email or web address]  [facility] [email or web address]  [email or web address]  

## 2019-11-28 ENCOUNTER — Telehealth: Payer: Self-pay | Admitting: Family Medicine

## 2019-11-28 NOTE — Telephone Encounter (Signed)
Called to try and schedule labs, left voicemail

## 2019-12-02 ENCOUNTER — Other Ambulatory Visit: Payer: Self-pay

## 2019-12-03 ENCOUNTER — Ambulatory Visit: Payer: Medicare HMO

## 2019-12-03 ENCOUNTER — Encounter: Payer: Self-pay | Admitting: Family Medicine

## 2019-12-03 ENCOUNTER — Other Ambulatory Visit (INDEPENDENT_AMBULATORY_CARE_PROVIDER_SITE_OTHER): Payer: Medicare HMO

## 2019-12-03 DIAGNOSIS — M8580 Other specified disorders of bone density and structure, unspecified site: Secondary | ICD-10-CM | POA: Diagnosis not present

## 2019-12-03 DIAGNOSIS — R5383 Other fatigue: Secondary | ICD-10-CM

## 2019-12-03 DIAGNOSIS — E785 Hyperlipidemia, unspecified: Secondary | ICD-10-CM | POA: Diagnosis not present

## 2019-12-03 DIAGNOSIS — R739 Hyperglycemia, unspecified: Secondary | ICD-10-CM

## 2019-12-03 LAB — CBC
HCT: 43.1 % (ref 36.0–46.0)
Hemoglobin: 14.4 g/dL (ref 12.0–15.0)
MCHC: 33.5 g/dL (ref 30.0–36.0)
MCV: 93.6 fl (ref 78.0–100.0)
Platelets: 210 10*3/uL (ref 150.0–400.0)
RBC: 4.6 Mil/uL (ref 3.87–5.11)
RDW: 13.6 % (ref 11.5–15.5)
WBC: 4.7 10*3/uL (ref 4.0–10.5)

## 2019-12-03 LAB — LIPID PANEL
Cholesterol: 231 mg/dL — ABNORMAL HIGH (ref 0–200)
HDL: 77.1 mg/dL (ref >39.00)
LDL Cholesterol: 139 mg/dL — ABNORMAL HIGH (ref 0–99)
NonHDL: 153.73
Total CHOL/HDL Ratio: 3
Triglycerides: 73 mg/dL (ref 0.0–149.0)
VLDL: 14.6 mg/dL (ref 0.0–40.0)

## 2019-12-03 LAB — TSH: TSH: 2.77 u[IU]/mL (ref 0.35–4.50)

## 2019-12-03 LAB — BASIC METABOLIC PANEL
BUN: 17 mg/dL (ref 6–23)
CO2: 31 mEq/L (ref 19–32)
Calcium: 9.5 mg/dL (ref 8.4–10.5)
Chloride: 101 mEq/L (ref 96–112)
Creatinine, Ser: 0.77 mg/dL (ref 0.40–1.20)
GFR: 76.77 mL/min (ref >60.00)
Glucose, Bld: 88 mg/dL (ref 70–99)
Potassium: 4.2 mEq/L (ref 3.5–5.1)
Sodium: 139 mEq/L (ref 135–145)

## 2019-12-03 LAB — VITAMIN B12: Vitamin B-12: 235 pg/mL (ref 211–911)

## 2019-12-03 LAB — HEMOGLOBIN A1C: Hgb A1c MFr Bld: 5.5 % (ref 4.6–6.5)

## 2019-12-03 LAB — VITAMIN D 25 HYDROXY (VIT D DEFICIENCY, FRACTURES): VITD: 41.09 ng/mL (ref 30.00–100.00)

## 2019-12-09 ENCOUNTER — Other Ambulatory Visit: Payer: Self-pay

## 2019-12-09 ENCOUNTER — Encounter: Payer: Self-pay | Admitting: Family Medicine

## 2019-12-09 ENCOUNTER — Ambulatory Visit (INDEPENDENT_AMBULATORY_CARE_PROVIDER_SITE_OTHER): Payer: Medicare HMO | Admitting: Family Medicine

## 2019-12-09 VITALS — BP 120/60 | HR 70 | Temp 97.9°F | Ht 62.0 in | Wt 165.2 lb

## 2019-12-09 DIAGNOSIS — M1711 Unilateral primary osteoarthritis, right knee: Secondary | ICD-10-CM | POA: Diagnosis not present

## 2019-12-09 MED ORDER — TRIAMCINOLONE ACETONIDE 40 MG/ML IJ SUSP
40.0000 mg | Freq: Once | INTRAMUSCULAR | Status: AC
  Administered 2019-12-09: 40 mg via INTRA_ARTICULAR

## 2019-12-09 NOTE — Progress Notes (Signed)
    Kayla Costales T. Dominque Marlin, MD, [facility]  Primary Care and Sports Medicine [facility] [place], [address]  Phone: [phone]  FAX: [phone]  Kayla Mcgrath - 73 y.o. female  MRN [id number]  Date of Birth: [birth year]  Date: 12/09/2019  PCP: Ronnald Nian, DO  Referral: Ronnald Nian, DO  Chief Complaint  Patient presents with  . Knee Pain    Right    This visit occurred during the SARS-CoV-2 public health emergency.  Safety protocols were in place, including screening questions prior to the visit, additional usage of staff PPE, and extensive cleaning of exam room while observing appropriate contact time as indicated for disinfecting solutions.   Subjective:   [name] is a 74 y.o. very pleasant female patient with Body mass index is 30.22 kg/m. who presents with the following:  Mod to severe knee pain on the R, known OA as above last XR in 2017.  CBD cream three times a day.  Tramadol - rarely used.   Steroid shot today  r knee inj  I will call her well, and she does have moderate to advanced osteoarthritis of the right knee.  Generally, she is doing relatively well, but she does have essentially some baseline pain at all times.  She is going on a trip with her family within the next few weeks, so she wanted to see if she can get a higher level of some pain relief.  I think that this is appropriate.    ICD-10-CM   1. Primary osteoarthritis of right knee  M17.11 triamcinolone acetonide (KENALOG-40) injection 40 mg   Procedure only.  Aspiration/Injection Procedure Note [name] 1945/07/01 Date of procedure: 12/09/2019  Procedure: Large Joint Aspiration / Injection of Knee, R Indications: Pain  Procedure Details Patient verbally consented to procedure. Risks, benefits, and alternatives explained. Sterilely prepped with Chloraprep. Ethyl cholride used for anesthesia. 9 cc Lidocaine  1% mixed with 1 mL of Kenalog 40 mg injected using the anteromedial approach without difficulty. No complications with procedure and tolerated well. Patient had decreased pain post-injection. Medication: 1 mL of Kenalog 40 mg  Meds ordered this encounter  Medications  . triamcinolone acetonide (KENALOG-40) injection 40 mg   Signed,  Kayla Blancett T. Benjamine Strout, MD

## 2019-12-11 ENCOUNTER — Other Ambulatory Visit: Payer: Self-pay

## 2019-12-11 ENCOUNTER — Encounter: Payer: Self-pay | Admitting: Family Medicine

## 2019-12-11 NOTE — Telephone Encounter (Signed)
Patient called back and states that she needs a refill on her Zetia.

## 2019-12-12 ENCOUNTER — Other Ambulatory Visit: Payer: Self-pay

## 2019-12-12 ENCOUNTER — Ambulatory Visit (INDEPENDENT_AMBULATORY_CARE_PROVIDER_SITE_OTHER): Payer: Medicare HMO

## 2019-12-12 DIAGNOSIS — E538 Deficiency of other specified B group vitamins: Secondary | ICD-10-CM

## 2019-12-12 MED ORDER — EZETIMIBE 10 MG PO TABS
ORAL_TABLET | ORAL | 2 refills | Status: DC
Start: 2019-12-12 — End: 2020-10-06

## 2019-12-12 MED ORDER — CYANOCOBALAMIN 1000 MCG/ML IJ SOLN
1000.0000 ug | Freq: Once | INTRAMUSCULAR | Status: AC
  Administered 2019-12-12: 1000 ug via INTRAMUSCULAR

## 2019-12-12 NOTE — Progress Notes (Signed)
Per orders of Dr. Bryan Lemma injection of B12 given by Verline Lema L Tanasia Budzinski in left deltoid. Patient tolerated injection well. No signs or symptoms of a reaction were noted prior to patient leaving the nurse visit. Patient will make appointment for 1 week.

## 2019-12-12 NOTE — Telephone Encounter (Signed)
Last OV 11/27/2019 Last fill 06/11/19  #90/2

## 2019-12-12 NOTE — Patient Instructions (Signed)
Health Maintenance Due  Topic Date Due   COLONOSCOPY  08/29/2018    Depression screen [facility] 2/9 11/27/2019 11/27/2019 11/12/2019  Decreased Interest 1 2 0  Down, Depressed, Hopeless 1 2 1   PHQ - 2 Score 2 4 1   Altered sleeping 1 - -  Tired, decreased energy 1 - -  Change in appetite 2 - -  Feeling bad or failure about yourself  2 - -  Trouble concentrating 0 - -  Moving slowly or fidgety/restless 0 - -  Suicidal thoughts 0 - -  PHQ-9 Score 8 - -  Difficult doing work/chores Somewhat difficult - -

## 2019-12-12 NOTE — Progress Notes (Signed)
I reviewed and agree with the documentation and plan as outlined below.   

## 2019-12-12 NOTE — Telephone Encounter (Signed)
Rx sent to pharmacy. Pt aware.  

## 2019-12-19 ENCOUNTER — Other Ambulatory Visit: Payer: Self-pay | Admitting: Family Medicine

## 2019-12-19 ENCOUNTER — Ambulatory Visit (INDEPENDENT_AMBULATORY_CARE_PROVIDER_SITE_OTHER): Payer: Medicare HMO

## 2019-12-19 ENCOUNTER — Other Ambulatory Visit: Payer: Self-pay

## 2019-12-19 DIAGNOSIS — E538 Deficiency of other specified B group vitamins: Secondary | ICD-10-CM

## 2019-12-19 DIAGNOSIS — F419 Anxiety disorder, unspecified: Secondary | ICD-10-CM

## 2019-12-19 MED ORDER — CYANOCOBALAMIN 1000 MCG/ML IJ SOLN
1000.0000 ug | Freq: Once | INTRAMUSCULAR | Status: AC
  Administered 2019-12-19: 1000 ug via INTRAMUSCULAR

## 2019-12-19 NOTE — Telephone Encounter (Signed)
Patient returned call and doesn't need it sent to the pharmacy.  She will take care of that at her f/u appt and then if she continues it have it sent to the mail order.  Dm/cma

## 2019-12-19 NOTE — Progress Notes (Signed)
After obtaining consent, and per orders of Dr. Bryan Lemma, injection of vitamin B12 given by Reuel Derby, [place]. Patient instructed to remain in clinic for 10 minutes afterwards, and to report any adverse reaction to me immediately.   Pt tolerated injection well and informed to scheduled for next injection in 1 week.

## 2019-12-19 NOTE — Telephone Encounter (Signed)
lft VM to rtn call.  Has upcoming appt on 12/25/19 and was given Lexapro 10 mg, on 11/27/19, #30 with 2 refills.   Pharmacy wants a 90 dy supply.  Called to see if she can wait till her appt to discuss this with provider.  Dm/cma

## 2019-12-25 ENCOUNTER — Encounter: Payer: Self-pay | Admitting: Family Medicine

## 2019-12-25 ENCOUNTER — Other Ambulatory Visit: Payer: Self-pay

## 2019-12-25 ENCOUNTER — Ambulatory Visit (INDEPENDENT_AMBULATORY_CARE_PROVIDER_SITE_OTHER): Payer: Medicare HMO | Admitting: Family Medicine

## 2019-12-25 VITALS — BP 118/68 | HR 78 | Temp 97.8°F | Ht 62.0 in | Wt 159.0 lb

## 2019-12-25 DIAGNOSIS — E538 Deficiency of other specified B group vitamins: Secondary | ICD-10-CM

## 2019-12-25 DIAGNOSIS — Z636 Dependent relative needing care at home: Secondary | ICD-10-CM | POA: Diagnosis not present

## 2019-12-25 DIAGNOSIS — F419 Anxiety disorder, unspecified: Secondary | ICD-10-CM

## 2019-12-25 MED ORDER — CYANOCOBALAMIN 1000 MCG/ML IJ SOLN
1000.0000 ug | Freq: Once | INTRAMUSCULAR | Status: AC
  Administered 2019-12-25: 10:00:00 1000 ug via INTRAMUSCULAR

## 2019-12-25 NOTE — Addendum Note (Signed)
Addended by: Konrad Saha on: 12/25/2019 09:49 AM   Modules accepted: Orders

## 2019-12-25 NOTE — Progress Notes (Signed)
Chief Complaint  Patient presents with  . Follow-up    F/u anxiety and B-12 shot.      HPI: Kayla Mcgrath is a 74 y.o. female here for f/u on anxiety. At last OV about 1 mo ago, pt was started on lexapro 10mg  daily. Her husband has Alzheimers and she is the primary caregiver. Today pt reports the medication is keeping her at a much more "level" state. She feels she has more energy and is more calm. She "just feels better".  No side effects.     Depression screen [facility] 2/9 11/27/2019 11/27/2019 11/12/2019  Decreased Interest 1 2 0  Down, Depressed, Hopeless 1 2 1   PHQ - 2 Score 2 4 1   Altered sleeping 1 - -  Tired, decreased energy 1 - -  Change in appetite 2 - -  Feeling bad or failure about yourself  2 - -  Trouble concentrating 0 - -  Moving slowly or fidgety/restless 0 - -  Suicidal thoughts 0 - -  PHQ-9 Score 8 - -  Difficult doing work/chores Somewhat difficult - -    GAD 7 : Generalized Anxiety Score 11/27/2019 12/05/2018  Nervous, Anxious, on Edge 1 3  Control/stop worrying 1 3  Worry too much - different things 2 3  Trouble relaxing 2 3  Restless 0 1  Easily annoyed or irritable 2 1  Afraid - awful might happen 3 3  Total GAD 7 Score 11 17  Anxiety Difficulty Somewhat difficult Somewhat difficult      Past Medical History:  Diagnosis Date  . Breast cancer ([place])   . Colon polyps    ?  Marland Kitchen Complication of anesthesia   . DCIS (ductal carcinoma in situ) of breast 2010  . Diverticulosis   . Full dentures   . Hyperlipidemia   . Joint pain   . PONV (postoperative nausea and vomiting)   . Skin cancer   . Wears glasses     Past Surgical History:  Procedure Laterality Date  . BREAST BIOPSY Right 05/11/2012  . BREAST BIOPSY Right 04/24/2012  . BREAST LUMPECTOMY Right december 2010   cancer-tamoxifen  . BREAST RECONSTRUCTION WITH PLACEMENT OF TISSUE EXPANDER AND FLEX HD (ACELLULAR HYDRATED DERMIS) Right 06/20/2012   Procedure: BREAST RECONSTRUCTION WITH  PLACEMENT OF TISSUE EXPANDER AND FLEX HD (ACELLULAR HYDRATED DERMIS);  Surgeon: Theodoro Kos, DO;  Location: [facility];  Service: Plastics;  Laterality: Right;  . BREAST REDUCTION WITH MASTOPEXY Left 10/11/2012   Procedure: LEFT BREAST REDUCTION WITH MASTOPEXY;  Surgeon: Theodoro Kos, DO;  Location: [place];  Service: Plastics;  Laterality: Left;  . cataract sugery Bilateral 03/2019  . COLONOSCOPY    . DILATION AND CURETTAGE OF UTERUS    . LIPOSUCTION WITH LIPOFILLING Left 10/11/2012   Procedure: LIPOSUCTION WITH LIPOFILLING;  Surgeon: Theodoro Kos, DO;  Location: [place];  Service: Plastics;  Laterality: Left;  Marland Kitchen MASTECTOMY Right 06/20/2012  . MASTECTOMY W/ SENTINEL NODE BIOPSY Right 06/20/2012   Procedure: right skin sparing MASTECTOMY WITH SENTINEL LYMPH NODE BIOPSY;  Surgeon: Stark Klein, MD;  Location: [place];  Service: General;  Laterality: Right;  . PARATHYROIDECTOMY Left 11/07/2017   Procedure: NECK EXPLORATION WITH LEFT THYROID LOBECTOMY;  Surgeon: [name], MD;  Location: [facility];  Service: General;  Laterality: Left;  . REDUCTION MAMMAPLASTY Left 10/2012  . REMOVAL OF TISSUE EXPANDER AND PLACEMENT OF IMPLANT Right 10/11/2012   Procedure: REMOVAL OF TISSUE  EXPANDER AND PLACEMENT OF SILICONE IMPLANT RIGHT;  Surgeon: Theodoro Kos, DO;  Location: [place];  Service: Plastics;  Laterality: Right;    Social History   Socioeconomic History  . Marital status: Married    Spouse name: Not on file  . Number of children: 2  . Years of education: Not on file  . Highest education level: Not on file  Occupational History  . Occupation: retired Corporate treasurer  Tobacco Use  . Smoking status: Former Smoker    Types: Cigarettes    Quit date: 06/14/1981    Years since quitting: 38.5  . Smokeless tobacco: Never Used  Vaping Use  . Vaping Use: Never used  Substance and Sexual Activity  . Alcohol use: No  . Drug  use: No  . Sexual activity: Yes    Birth control/protection: Post-menopausal  Other Topics Concern  . Not on file  Social History Narrative   Recently moved from [place] to be closer to her two daughters and grandchildren.   Retired Corporate treasurer.   DNR- forms singed on 05/07/2012 and returned to pt.   Social Determinants of Health   Financial Resource Strain: Low Risk   . Difficulty of Paying Living Expenses: Not hard at all  Food Insecurity: No Food Insecurity  . Worried About Charity fundraiser in the Last Year: Never true  . Ran Out of Food in the Last Year: Never true  Transportation Needs: No Transportation Needs  . Lack of Transportation (Medical): No  . Lack of Transportation (Non-Medical): No  Physical Activity: Inactive  . Days of Exercise per Week: 0 days  . Minutes of Exercise per Session: 0 min  Stress: No Stress Concern Present  . Feeling of Stress : Only a little  Social Connections: Moderately Integrated  . Frequency of Communication with Friends and Family: More than three times a week  . Frequency of Social Gatherings with Friends and Family: Once a week  . Attends Religious Services: More than 4 times per year  . Active Member of Clubs or Organizations: No  . Attends Archivist Meetings: Never  . Marital Status: Married  Human resources officer Violence: Not At Risk  . Fear of Current or Ex-Partner: No  . Emotionally Abused: No  . Physically Abused: No  . Sexually Abused: No    Family History  Problem Relation Age of Onset  . Alcohol abuse Mother   . Colon polyps Mother   . Mental illness Mother   . Alcohol abuse Father        suicide  . Mental illness Father   . Heart disease Paternal Grandfather      Immunization History  Administered Date(s) Administered  . Fluad Quad(high Dose 65+) 09/25/2018, 11/27/2019  . Influenza, High Dose Seasonal PF 11/08/2017  . Influenza,inj,Quad PF,6+ Mos 10/02/2013, 10/10/2016  . Influenza-Unspecified 11/10/2012  . PFIZER  SARS-COV-2 Vaccination 03/02/2019, 03/26/2019, 10/28/2019  . Pneumococcal Conjugate-13 05/30/2013  . Pneumococcal Polysaccharide-23 05/07/2012  . Td 05/30/2013  . Zoster 05/07/2012    Outpatient Encounter Medications as of 12/25/2019  Medication Sig Note  . acetaminophen (TYLENOL) 500 MG tablet Take 1,000 mg by mouth every 6 (six) hours as needed for mild pain or moderate pain.   . Cholecalciferol (VITAMIN D) 2000 UNITS CAPS Take one by mouth daily   . escitalopram (LEXAPRO) 10 MG tablet 1/2 tab po daily x 1 week, 1 tab po daily   . ezetimibe (ZETIA) 10 MG tablet TAKE 1 TABLET EVERY DAY   .  Lidocaine-Menthol (CBD4 FREEZE PUMP MAXIMUM STR) 4-1 % CREA Apply topically.   . traMADol (ULTRAM) 50 MG tablet Take 1 tablet (50 mg total) by mouth every 6 (six) hours as needed. 11/27/2019: PRN  . Turmeric 400 MG CAPS Take 300 mg by mouth daily.    No facility-administered encounter medications on file as of 12/25/2019.     ROS: Pertinent positives and negatives noted in HPI. Remainder of ROS non-contributory    Allergies  Allergen Reactions  . Latex Rash    BP 118/68   Pulse 78   Temp 97.8 F (36.6 C) (Temporal)   Ht 5\' 2"  (1.575 m)   Wt 159 lb (72.1 kg)   SpO2 98%   BMI 29.08 kg/m   Physical Exam Constitutional:      General: She is not in acute distress.    Appearance: Normal appearance.  Cardiovascular:     Rate and Rhythm: Normal rate and regular rhythm.     Pulses: Normal pulses.     Heart sounds: Normal heart sounds.  Pulmonary:     Breath sounds: Normal breath sounds. No wheezing or rhonchi.  Neurological:     Mental Status: She is alert and oriented to person, place, and time.  Psychiatric:        Mood and Affect: Mood normal.        Behavior: Behavior normal.      A/P:  1. Anxiety 2. Caregiver stress - PHQ-9 = 0 (previously 8), GAD-7 = 6 (previously 11) - much-improved on lexapro 10mg  daily, no side effects - pt would like continue med at current dose -  f/u PRN   This visit occurred during the SARS-CoV-2 public health emergency.  Safety protocols were in place, including screening questions prior to the visit, additional usage of staff PPE, and extensive cleaning of exam room while observing appropriate contact time as indicated for disinfecting solutions.

## 2019-12-26 ENCOUNTER — Ambulatory Visit: Payer: Medicare HMO

## 2020-01-01 ENCOUNTER — Ambulatory Visit (INDEPENDENT_AMBULATORY_CARE_PROVIDER_SITE_OTHER): Payer: Medicare HMO

## 2020-01-01 ENCOUNTER — Other Ambulatory Visit: Payer: Self-pay

## 2020-01-01 DIAGNOSIS — E538 Deficiency of other specified B group vitamins: Secondary | ICD-10-CM

## 2020-01-01 MED ORDER — CYANOCOBALAMIN 1000 MCG/ML IJ SOLN
1000.0000 ug | Freq: Once | INTRAMUSCULAR | Status: AC
  Administered 2020-01-01: 14:00:00 1000 ug via INTRAMUSCULAR

## 2020-01-01 NOTE — Progress Notes (Signed)
I reviewed and agree with the documentation and plan as outlined below.   

## 2020-01-01 NOTE — Progress Notes (Signed)
After obtaining consent, and per orders of Dr. Bryan Lemma, injection of vitamin B12 given by Reuel Derby, Rose. Patient instructed to remain in clinic for 10 minutes afterwards, and to report any adverse reaction to me immediately.  Pt tolerated injection well and scheduled f/u injection during check out.

## 2020-01-07 ENCOUNTER — Ambulatory Visit (AMBULATORY_SURGERY_CENTER): Payer: Self-pay | Admitting: *Deleted

## 2020-01-07 ENCOUNTER — Other Ambulatory Visit: Payer: Self-pay

## 2020-01-07 VITALS — Ht 62.0 in | Wt 162.0 lb

## 2020-01-07 DIAGNOSIS — Z86010 Personal history of colonic polyps: Secondary | ICD-10-CM

## 2020-01-07 MED ORDER — NA SULFATE-K SULFATE-MG SULF 17.5-3.13-1.6 GM/177ML PO SOLN: ORAL | 0 refills | Status: DC

## 2020-01-07 NOTE — Progress Notes (Signed)
Patient is here in-person for PV. Patient denies any allergies to eggs or soy. Patient denies any problems with anesthesia/sedation. PONV. Patient denies any oxygen use at home. Patient denies taking any diet/weight loss medications or blood thinners. Patient is not being treated for MRSA or C-diff. Patient is aware of our care-partner policy and Covid-19 safety protocol.  COVID-19 vaccines completed on 11/2019, per patient.

## 2020-01-14 ENCOUNTER — Encounter: Payer: Self-pay | Admitting: Gastroenterology

## 2020-01-18 ENCOUNTER — Other Ambulatory Visit: Payer: Self-pay | Admitting: Family Medicine

## 2020-01-18 DIAGNOSIS — F419 Anxiety disorder, unspecified: Secondary | ICD-10-CM

## 2020-01-20 ENCOUNTER — Encounter: Payer: Self-pay | Admitting: Gastroenterology

## 2020-01-20 ENCOUNTER — Ambulatory Visit (AMBULATORY_SURGERY_CENTER): Payer: Medicare HMO | Admitting: Gastroenterology

## 2020-01-20 ENCOUNTER — Other Ambulatory Visit: Payer: Self-pay

## 2020-01-20 VITALS — BP 128/43 | HR 69 | Temp 97.1°F | Resp 20 | Ht 62.0 in | Wt 162.0 lb

## 2020-01-20 DIAGNOSIS — Z853 Personal history of malignant neoplasm of breast: Secondary | ICD-10-CM | POA: Diagnosis not present

## 2020-01-20 DIAGNOSIS — K635 Polyp of colon: Secondary | ICD-10-CM | POA: Diagnosis not present

## 2020-01-20 DIAGNOSIS — D125 Benign neoplasm of sigmoid colon: Secondary | ICD-10-CM

## 2020-01-20 DIAGNOSIS — Z86010 Personal history of colonic polyps: Secondary | ICD-10-CM | POA: Diagnosis not present

## 2020-01-20 DIAGNOSIS — D123 Benign neoplasm of transverse colon: Secondary | ICD-10-CM

## 2020-01-20 DIAGNOSIS — D122 Benign neoplasm of ascending colon: Secondary | ICD-10-CM | POA: Diagnosis not present

## 2020-01-20 DIAGNOSIS — E7800 Pure hypercholesterolemia, unspecified: Secondary | ICD-10-CM | POA: Diagnosis not present

## 2020-01-20 MED ORDER — SODIUM CHLORIDE 0.9 % IV SOLN: 500.0000 mL | Freq: Once | INTRAVENOUS | Status: DC

## 2020-01-20 NOTE — Progress Notes (Signed)
Pt's states no medical or surgical changes since previsit or office visit.   VS taken by CW 

## 2020-01-20 NOTE — Progress Notes (Signed)
Called to room to assist during endoscopic procedure.  Patient ID and intended procedure confirmed with present staff. Received instructions for my participation in the procedure from the performing physician.  

## 2020-01-20 NOTE — Progress Notes (Signed)
Report given to PACU, vss 

## 2020-01-20 NOTE — Patient Instructions (Signed)
YOU HAD AN ENDOSCOPIC PROCEDURE TODAY AT THE [facility]:   Refer to the procedure report that was given to you for any specific questions about what was found during the examination.  If the procedure report does not answer your questions, please call your gastroenterologist to clarify.  If you requested that your care partner not be given the details of your procedure findings, then the procedure report has been included in a sealed envelope for you to review at your convenience later.  YOU SHOULD EXPECT: Some feelings of bloating in the abdomen. Passage of more gas than usual.  Walking can help get rid of the air that was put into your GI tract during the procedure and reduce the bloating. If you had a lower endoscopy (such as a colonoscopy or flexible sigmoidoscopy) you may notice spotting of blood in your stool or on the toilet paper. If you underwent a bowel prep for your procedure, you may not have a normal bowel movement for a few days.  Please Note:  You might notice some irritation and congestion in your nose or some drainage.  This is from the oxygen used during your procedure.  There is no need for concern and it should clear up in a day or so.  SYMPTOMS TO REPORT IMMEDIATELY:   Following lower endoscopy (colonoscopy or flexible sigmoidoscopy):  Excessive amounts of blood in the stool  Significant tenderness or worsening of abdominal pains  Swelling of the abdomen that is new, acute  Fever of 100F or higher   Following upper endoscopy (EGD)  Vomiting of blood or coffee ground material  New chest pain or pain under the shoulder blades  Painful or persistently difficult swallowing  New shortness of breath  Fever of 100F or higher  Black, tarry-looking stools  For urgent or emergent issues, a gastroenterologist can be reached at any hour by calling [phone]. Do not use MyChart messaging for urgent concerns.    DIET:  We do recommend a small meal at first, but  then you may proceed to your regular diet.  Drink plenty of fluids but you should avoid alcoholic beverages for 24 hours.  ACTIVITY:  You should plan to take it easy for the rest of today and you should NOT DRIVE or use heavy machinery until tomorrow (because of the sedation medicines used during the test).    FOLLOW UP: Our staff will call the number listed on your records 48-72 hours following your procedure to check on you and address any questions or concerns that you may have regarding the information given to you following your procedure. If we do not reach you, we will leave a message.  We will attempt to reach you two times.  During this call, we will ask if you have developed any symptoms of COVID 19. If you develop any symptoms (ie: fever, flu-like symptoms, shortness of breath, cough etc.) before then, please call [phone].  If you test positive for Covid 19 in the 2 weeks post procedure, please call and report this information to us.    If any biopsies were taken you will be contacted by phone or by letter within the next 1-3 weeks.  Please call us at [phone] if you have not heard about the biopsies in 3 weeks.    SIGNATURES/CONFIDENTIALITY: You and/or your care partner have signed paperwork which will be entered into your electronic medical record.  These signatures attest to the fact that that the information above on   your After Visit Summary has been reviewed and is understood.  Full responsibility of the confidentiality of this discharge information lies with you and/or your care-partner. 

## 2020-01-20 NOTE — Op Note (Addendum)
[facility] Patient Name: Kayla Mcgrath Procedure Date: 01/20/2020 10:58 AM MRN: [id number] Endoscopist: Thornton Park MD, MD Age: 75 Referring MD:  Date of Birth: 25-Jan-1945 Gender: Female Account #: [id number] Procedure:                Colonoscopy Indications:              Surveillance: Personal history of adenomatous                            polyps on last colonoscopy >5 years ago                           History of colon polyps, most recently a tubular                            adenoma and hyperplastic polyp on colonoscopy with                            Dr. Olevia Perches 74                           Mother with colon polyps                           No known family history of colon cancer Medicines:                Monitored Anesthesia Care Procedure:                Pre-Anesthesia Assessment:                           - Prior to the procedure, a History and Physical                            was performed, and patient medications and                            allergies were reviewed. The patient's tolerance of                            previous anesthesia was also reviewed. The risks                            and benefits of the procedure and the sedation                            options and risks were discussed with the patient.                            All questions were answered, and informed consent                            was obtained. Prior Anticoagulants: The patient has  taken no previous anticoagulant or antiplatelet                            agents. ASA Grade Assessment: II - A patient with                            mild systemic disease. After reviewing the risks                            and benefits, the patient was deemed in                            satisfactory condition to undergo the procedure.                           After obtaining informed consent, the colonoscope                            was passed  under direct vision. Throughout the                            procedure, the patient's blood pressure, pulse, and                            oxygen saturations were monitored continuously. The                            Olympus CF-HQ190 (#[id number]) Colonoscope was                            introduced through the anus and advanced to the 4                            cm into the ileum. The colonoscopy was performed                            with moderate difficulty due to multiple                            diverticula in the colon, restricted mobility of                            the colon, significant looping and a tortuous                            colon. Successful completion of the procedure was                            aided by changing the patient's position,                            withdrawing and reinserting the scope and applying  abdominal pressure. The patient tolerated the                            procedure well. The quality of the bowel                            preparation was good. The terminal ileum, ileocecal                            valve, appendiceal orifice, and rectum were                            photographed. Scope In: 11:07:49 AM Scope Out: 11:26:20 AM Scope Withdrawal Time: 0 hours 11 minutes 45 seconds  Total Procedure Duration: 0 hours 18 minutes 31 seconds  Findings:                 The perianal and digital rectal examinations were                            normal.                           Non-bleeding internal hemorrhoids were found.                           Multiple small and large-mouthed diverticula were                            found in the sigmoid colon and descending colon. In                            the distal sigmoid colon this results in luminal                            narrowing.                           A 4 mm polyp was found in the sigmoid colon. The                            polyp was sessile.  The polyp was removed with a                            cold snare. Resection and retrieval were complete.                            Estimated blood loss was minimal.                           A 4 mm polyp was found in the distal transverse                            colon. The polyp was sessile. The polyp was removed  with a cold snare. Resection and retrieval were                            complete. Estimated blood loss was minimal.                           A 2 mm polyp was found in the ascending colon. The                            polyp was sessile. The polyp was removed with a                            cold snare. Resection and retrieval were complete.                            Estimated blood loss was minimal.                           The exam was otherwise without abnormality on                            direct and retroflexion views. Complications:            No immediate complications. Estimated blood loss:                            Minimal. Estimated Blood Loss:     Estimated blood loss was minimal. Impression:               - Non-bleeding internal hemorrhoids.                           - Diverticulosis in the sigmoid colon and in the                            descending colon.                           - One 4 mm polyp in the sigmoid colon, removed with                            a cold snare. Resected and retrieved.                           - One 4 mm polyp in the distal transverse colon,                            removed with a cold snare. Resected and retrieved.                           - One 2 mm polyp in the ascending colon, removed                            with a cold snare. Resected and retrieved.                           -  The examination was otherwise normal on direct                            and retroflexion views. Recommendation:           - Patient has a contact number available for                            emergencies.  The signs and symptoms of potential                            delayed complications were discussed with the                            patient. Return to normal activities tomorrow.                            Written discharge instructions were provided to the                            patient.                           - Follow a high fiber diet. Drink at least 64                            ounces of water daily. Add a daily stool bulking                            agent such as psyllium (an exampled would be                            Metamucil).                           - Continue present medications.                           - Await pathology results.                           - Repeat colonoscopy date to be determined after                            pending pathology results are reviewed for                            surveillance. If all three polyps are adenoma,                            consider surveillance colonoscopy in 3 years if it                            is clinically appropriate at that time.                           -  Emerging evidence supports eating a diet of                            fruits, vegetables, grains, calcium, and yogurt                            while reducing red meat and alcohol may reduce the                            risk of colon cancer.                           - Thank you for allowing me to be involved in your                            colon cancer prevention. Thornton Park MD, MD 01/20/2020 11:33:40 AM This report has been signed electronically.

## 2020-01-22 ENCOUNTER — Telehealth: Payer: Self-pay

## 2020-01-22 NOTE — Telephone Encounter (Signed)
NO ANSWER, MESSAGE LEFT FOR PATIENT. 

## 2020-01-22 NOTE — Telephone Encounter (Signed)
Second follow up call attempt, no  Answer, LM

## 2020-01-23 NOTE — Telephone Encounter (Signed)
Last OV 12/25/19 Last fill 11/27/19  #30/2

## 2020-01-28 ENCOUNTER — Encounter: Payer: Self-pay | Admitting: Gastroenterology

## 2020-02-04 ENCOUNTER — Ambulatory Visit (INDEPENDENT_AMBULATORY_CARE_PROVIDER_SITE_OTHER): Payer: Medicare HMO

## 2020-02-04 ENCOUNTER — Other Ambulatory Visit: Payer: Self-pay

## 2020-02-04 DIAGNOSIS — E538 Deficiency of other specified B group vitamins: Secondary | ICD-10-CM | POA: Diagnosis not present

## 2020-02-04 MED ORDER — CYANOCOBALAMIN 1000 MCG/ML IJ SOLN
1000.0000 ug | Freq: Once | INTRAMUSCULAR | Status: AC
  Administered 2020-02-04: 1000 ug via INTRAMUSCULAR

## 2020-02-04 NOTE — Progress Notes (Signed)
Medical screening examination/treatment/procedure(s) were performed by the CMA. As primary care provider I was immediately available for consulation/collaboration. I agree with above documentation. Bern Fare, AGNP-C 

## 2020-02-04 NOTE — Progress Notes (Signed)
Per orders of [facility] ([facility]), injection of B12 given by [name], cma.  Patient tolerated injection well.

## 2020-03-03 ENCOUNTER — Ambulatory Visit (INDEPENDENT_AMBULATORY_CARE_PROVIDER_SITE_OTHER): Payer: Medicare HMO

## 2020-03-03 ENCOUNTER — Other Ambulatory Visit: Payer: Self-pay

## 2020-03-03 DIAGNOSIS — E538 Deficiency of other specified B group vitamins: Secondary | ICD-10-CM

## 2020-03-03 MED ORDER — CYANOCOBALAMIN 1000 MCG/ML IJ SOLN
1000.0000 ug | Freq: Once | INTRAMUSCULAR | Status: AC
  Administered 2020-03-03: 1000 ug via INTRAMUSCULAR

## 2020-03-03 NOTE — Progress Notes (Signed)
Per orders of Dr. Loletha Grayer pt is here for B12 injection, pt received injection in left deltoid, pt tolerated  Injection well

## 2020-03-31 ENCOUNTER — Other Ambulatory Visit: Payer: Self-pay

## 2020-03-31 ENCOUNTER — Ambulatory Visit (INDEPENDENT_AMBULATORY_CARE_PROVIDER_SITE_OTHER): Payer: Medicare HMO

## 2020-03-31 DIAGNOSIS — E538 Deficiency of other specified B group vitamins: Secondary | ICD-10-CM

## 2020-03-31 MED ORDER — CYANOCOBALAMIN 1000 MCG/ML IJ SOLN
1000.0000 ug | Freq: Once | INTRAMUSCULAR | Status: AC
Start: 2020-03-31 — End: 2020-03-31
  Administered 2020-03-31: 1000 ug via INTRAMUSCULAR

## 2020-03-31 NOTE — Progress Notes (Signed)
Per orders of Dr. Bryan Lemma pt is here for B12 injection, pt received injection in in right deltoid , injection was give by [place] CMA-CPT. Pt tolerated injection well

## 2020-04-15 ENCOUNTER — Encounter: Payer: Self-pay | Admitting: Family Medicine

## 2020-05-05 ENCOUNTER — Other Ambulatory Visit: Payer: Self-pay

## 2020-05-05 ENCOUNTER — Ambulatory Visit (INDEPENDENT_AMBULATORY_CARE_PROVIDER_SITE_OTHER): Payer: Medicare HMO

## 2020-05-05 DIAGNOSIS — E538 Deficiency of other specified B group vitamins: Secondary | ICD-10-CM | POA: Diagnosis not present

## 2020-05-05 MED ORDER — CYANOCOBALAMIN 1000 MCG/ML IJ SOLN
1000.0000 ug | Freq: Once | INTRAMUSCULAR | Status: AC
  Administered 2020-05-05: 1000 ug via INTRAMUSCULAR

## 2020-05-05 NOTE — Progress Notes (Signed)
Per orders of Dr. Cirigliano  injection of B12  given by Shawnique Mariotti L Tyronica Truxillo in right deltoid. Patient tolerated injection well. No signs or symptoms of a reaction were noted prior to patient leaving the nurse visit. Patient will make appointment for 1 month.   

## 2020-05-05 NOTE — Patient Instructions (Signed)
Health Maintenance Due  Topic Date Due  . COVID-19 Vaccine (4 - Booster for Pfizer series) 04/27/2020    Depression screen [facility] 2/9 12/25/2019 11/27/2019 11/27/2019  Decreased Interest 0 1 2  Down, Depressed, Hopeless 0 1 2  PHQ - 2 Score 0 2 4  Altered sleeping 0 1 -  Tired, decreased energy 0 1 -  Change in appetite 0 2 -  Feeling bad or failure about yourself  0 2 -  Trouble concentrating 0 0 -  Moving slowly or fidgety/restless 0 0 -  Suicidal thoughts 0 0 -  PHQ-9 Score 0 8 -  Difficult doing work/chores Not difficult at all Somewhat difficult -

## 2020-06-02 ENCOUNTER — Other Ambulatory Visit: Payer: Self-pay

## 2020-06-02 ENCOUNTER — Ambulatory Visit (INDEPENDENT_AMBULATORY_CARE_PROVIDER_SITE_OTHER): Payer: Medicare HMO

## 2020-06-02 DIAGNOSIS — E538 Deficiency of other specified B group vitamins: Secondary | ICD-10-CM

## 2020-06-02 MED ORDER — CYANOCOBALAMIN 1000 MCG/ML IJ SOLN
1000.0000 ug | Freq: Once | INTRAMUSCULAR | Status: AC
  Administered 2020-06-02: 1000 ug via INTRAMUSCULAR

## 2020-06-02 NOTE — Patient Instructions (Signed)
Health Maintenance Due  Topic Date Due  . COVID-19 Vaccine (4 - Booster for Pfizer series) 01/28/2020    Depression screen [facility] 2/9 12/25/2019 11/27/2019 11/27/2019  Decreased Interest 0 1 2  Down, Depressed, Hopeless 0 1 2  PHQ - 2 Score 0 2 4  Altered sleeping 0 1 -  Tired, decreased energy 0 1 -  Change in appetite 0 2 -  Feeling bad or failure about yourself  0 2 -  Trouble concentrating 0 0 -  Moving slowly or fidgety/restless 0 0 -  Suicidal thoughts 0 0 -  PHQ-9 Score 0 8 -  Difficult doing work/chores Not difficult at all Somewhat difficult -

## 2020-06-02 NOTE — Progress Notes (Signed)
Per orders of  injection Dr. Bryan Lemma of B12 given by Verline Lema L Rayhan Groleau in left deltoid. Patient tolerated injection well. No signs or symptoms of a reaction were noted prior to patient leaving the nurse visit. Patient will make appointment for 1 month.

## 2020-07-06 ENCOUNTER — Other Ambulatory Visit: Payer: Self-pay | Admitting: Family Medicine

## 2020-07-06 DIAGNOSIS — Z1231 Encounter for screening mammogram for malignant neoplasm of breast: Secondary | ICD-10-CM

## 2020-07-07 ENCOUNTER — Ambulatory Visit (INDEPENDENT_AMBULATORY_CARE_PROVIDER_SITE_OTHER): Payer: Medicare HMO

## 2020-07-07 ENCOUNTER — Other Ambulatory Visit: Payer: Self-pay

## 2020-07-07 DIAGNOSIS — E538 Deficiency of other specified B group vitamins: Secondary | ICD-10-CM

## 2020-07-07 MED ORDER — CYANOCOBALAMIN 1000 MCG/ML IJ SOLN
1000.0000 ug | Freq: Once | INTRAMUSCULAR | Status: AC
  Administered 2020-07-07: 1000 ug via INTRAMUSCULAR

## 2020-07-07 NOTE — Patient Instructions (Signed)
Pt received monthly vitamin b12 injection per Dr. Bryan Lemma request. Pt tolerated injection well.

## 2020-07-16 NOTE — Progress Notes (Addendum)
Per orders of Dr. Bryan Lemma, B12 injection given to patient by Reuel Derby, RMA. Pt tolerated injection well.

## 2020-07-27 ENCOUNTER — Telehealth: Payer: Self-pay

## 2020-07-27 ENCOUNTER — Other Ambulatory Visit: Payer: Self-pay | Admitting: Family Medicine

## 2020-07-30 ENCOUNTER — Encounter: Payer: Self-pay | Admitting: Family Medicine

## 2020-07-30 ENCOUNTER — Other Ambulatory Visit: Payer: Self-pay

## 2020-07-30 ENCOUNTER — Ambulatory Visit (INDEPENDENT_AMBULATORY_CARE_PROVIDER_SITE_OTHER): Payer: Medicare HMO | Admitting: Family Medicine

## 2020-07-30 VITALS — BP 114/74 | HR 68 | Temp 97.2°F | Wt 162.6 lb

## 2020-07-30 DIAGNOSIS — Z636 Dependent relative needing care at home: Secondary | ICD-10-CM | POA: Diagnosis not present

## 2020-07-30 DIAGNOSIS — E538 Deficiency of other specified B group vitamins: Secondary | ICD-10-CM

## 2020-07-30 DIAGNOSIS — F419 Anxiety disorder, unspecified: Secondary | ICD-10-CM | POA: Diagnosis not present

## 2020-07-30 LAB — VITAMIN B12: Vitamin B-12: 658 pg/mL (ref 211–911)

## 2020-07-30 NOTE — Progress Notes (Signed)
Kayla Mcgrath is a 75 y.o. female  Chief Complaint  Patient presents with   Follow-up    F/u recheck for B12 injections    HPI: Kayla Mcgrath is a 75 y.o. female patient seen today for f/u on B12 deficiency. Pt has done 6+ mo of B12 injections. She feels some improvement in her fatigue but thinks that may also be due to the fact that her anxiety/stress are better controlled on the lexapro.    Lab Results  Component Value Date   VITAMINB12 235 12/03/2019    Past Medical History:  Diagnosis Date   Breast cancer [facility]) 2010   Colon polyps    ?   Complication of anesthesia    DCIS (ductal carcinoma in situ) of breast 2010   Diverticulosis    Full dentures    Hyperlipidemia    Joint pain    PONV (postoperative nausea and vomiting)    Skin cancer    Wears glasses     Past Surgical History:  Procedure Laterality Date   BREAST BIOPSY Right 05/11/2012   BREAST BIOPSY Right 04/24/2012   BREAST LUMPECTOMY Right december 2010   cancer-tamoxifen   BREAST RECONSTRUCTION WITH PLACEMENT OF TISSUE EXPANDER AND FLEX HD (ACELLULAR HYDRATED DERMIS) Right 06/20/2012   Procedure: BREAST RECONSTRUCTION WITH PLACEMENT OF TISSUE EXPANDER AND FLEX HD (ACELLULAR HYDRATED DERMIS);  Surgeon: Theodoro Kos, DO;  Location: [place];  Service: Plastics;  Laterality: Right;   BREAST REDUCTION WITH MASTOPEXY Left 10/11/2012   Procedure: LEFT BREAST REDUCTION WITH MASTOPEXY;  Surgeon: Theodoro Kos, DO;  Location: [place];  Service: Plastics;  Laterality: Left;   cataract sugery Bilateral 03/2019   COLONOSCOPY  08/28/2013   [place]   DILATION AND CURETTAGE OF UTERUS     LIPOSUCTION WITH LIPOFILLING Left 10/11/2012   Procedure: LIPOSUCTION WITH LIPOFILLING;  Surgeon: Theodoro Kos, DO;  Location: [place];  Service: Plastics;  Laterality: Left;   MASTECTOMY Right 06/20/2012   MASTECTOMY W/ SENTINEL NODE BIOPSY Right 06/20/2012   Procedure: right  skin sparing MASTECTOMY WITH SENTINEL LYMPH NODE BIOPSY;  Surgeon: Stark Klein, MD;  Location: [place];  Service: General;  Laterality: Right;   PARATHYROIDECTOMY Left 11/07/2017   Procedure: NECK EXPLORATION WITH LEFT THYROID LOBECTOMY;  Surgeon: [name], MD;  Location: [facility];  Service: General;  Laterality: Left;   POLYPECTOMY     REDUCTION MAMMAPLASTY Left 10/2012   REMOVAL OF TISSUE EXPANDER AND PLACEMENT OF IMPLANT Right 10/11/2012   Procedure: REMOVAL OF TISSUE EXPANDER AND PLACEMENT OF SILICONE IMPLANT RIGHT;  Surgeon: Theodoro Kos, DO;  Location: [place];  Service: Plastics;  Laterality: Right;    Social History   Socioeconomic History   Marital status: Married    Spouse name: Not on file   Number of children: 2   Years of education: Not on file   Highest education level: Not on file  Occupational History   Occupation: retired LPN  Tobacco Use   Smoking status: Former    Types: Cigarettes    Quit date: 06/14/1981    Years since quitting: 39.1   Smokeless tobacco: Never  Vaping Use   Vaping Use: Never used  Substance and Sexual Activity   Alcohol use: No   Drug use: No   Sexual activity: Yes    Birth control/protection: Post-menopausal  Other Topics Concern   Not on file  Social History Narrative   Recently  moved from [place] to be closer to her two daughters and grandchildren.   Retired Corporate treasurer.   DNR- forms singed on 05/07/2012 and returned to pt.   Social Determinants of Health   Financial Resource Strain: Low Risk    Difficulty of Paying Living Expenses: Not hard at all  Food Insecurity: No Food Insecurity   Worried About Charity fundraiser in the Last Year: Never true   [place] in the Last Year: Never true  Transportation Needs: No Transportation Needs   Lack of Transportation (Medical): No   Lack of Transportation (Non-Medical): No  Physical Activity: Inactive   Days of Exercise per Week: 0 days   Minutes of  Exercise per Session: 0 min  Stress: No Stress Concern Present   Feeling of Stress : Only a little  Social Connections: Moderately Integrated   Frequency of Communication with Friends and Family: More than three times a week   Frequency of Social Gatherings with Friends and Family: Once a week   Attends Religious Services: More than 4 times per year   Active Member of [employer] or Organizations: No   Attends Archivist Meetings: Never   Marital Status: Married  Human resources officer Violence: Not At Risk   Fear of Current or Ex-Partner: No   Emotionally Abused: No   Physically Abused: No   Sexually Abused: No    Family History  Problem Relation Age of Onset   Alcohol abuse Mother    Colon polyps Mother    Mental illness Mother    Alcohol abuse Father        suicide   Mental illness Father    Heart disease Paternal Grandfather    Colon cancer Neg Hx    Esophageal cancer Neg Hx    Rectal cancer Neg Hx    Stomach cancer Neg Hx      Immunization History  Administered Date(s) Administered   Fluad Quad(high Dose 65+) 09/25/2018, 11/27/2019   Influenza, High Dose Seasonal PF 11/08/2017   Influenza,inj,Quad PF,6+ Mos 10/02/2013, 10/10/2016   Influenza-Unspecified 11/10/2012   PFIZER(Purple Top)SARS-COV-2 Vaccination 03/02/2019, 03/26/2019, 10/28/2019   Pneumococcal Conjugate-13 05/30/2013   Pneumococcal Polysaccharide-23 05/07/2012   Td 05/30/2013   Zoster, Live 05/07/2012    Outpatient Encounter Medications as of 07/30/2020  Medication Sig   acetaminophen (TYLENOL) 500 MG tablet Take 1,000 mg by mouth every 6 (six) hours as needed for mild pain or moderate pain.   Cholecalciferol (VITAMIN D) 2000 UNITS CAPS Take one by mouth daily   escitalopram (LEXAPRO) 10 MG tablet Take 1 tablet (10 mg total) by mouth daily.   ezetimibe (ZETIA) 10 MG tablet TAKE 1 TABLET EVERY DAY   traMADol (ULTRAM) 50 MG tablet TAKE 1 TABLET (50 MG TOTAL) BY MOUTH EVERY 6 (SIX) HOURS AS NEEDED.    Turmeric 400 MG CAPS Take 300 mg by mouth daily.   No facility-administered encounter medications on file as of 07/30/2020.     ROS: Pertinent positives and negatives noted in HPI. Remainder of ROS non-contributory  Allergies  Allergen Reactions   Latex Rash    BP 114/74 (BP Location: Left Arm, Patient Position: Sitting, Cuff Size: Normal)   Pulse 68   Temp (!) 97.2 F (36.2 C)   Wt 162 lb 9.6 oz (73.8 kg)   SpO2 96%   BMI 29.74 kg/m   Wt Readings from Last 3 Encounters:  07/30/20 162 lb 9.6 oz (73.8 kg)  01/20/20 162 lb (73.5 kg)  01/07/20 162 lb (73.5 kg)   Temp Readings from Last 3 Encounters:  07/30/20 (!) 97.2 F (36.2 C)  01/20/20 (!) 97.1 F (36.2 C)  12/25/19 97.8 F (36.6 C) (Temporal)   BP Readings from Last 3 Encounters:  07/30/20 114/74  01/20/20 (!) 128/43  12/25/19 118/68   Pulse Readings from Last 3 Encounters:  07/30/20 68  01/20/20 69  12/25/19 78     Physical Exam Constitutional:      General: She is not in acute distress.    Appearance: She is not ill-appearing.  Cardiovascular:     Rate and Rhythm: Normal rate and regular rhythm.     Pulses: Normal pulses.  Pulmonary:     Effort: Pulmonary effort is normal.     Breath sounds: Normal breath sounds. No wheezing or rhonchi.  Musculoskeletal:     Right lower leg: No edema.     Left lower leg: No edema.  Neurological:     Mental Status: She is alert and oriented to person, place, and time.  Psychiatric:        Mood and Affect: Mood normal.        Behavior: Behavior normal.     A/P:  1. B12 deficiency - stop B12 injections and start oral B12 1000-2037mcg daily - Vitamin B12 - f/u PRN  2. Caregiver stress 3. Anxiety - controlled, cont lexapro 10mg  daily  This visit occurred during the SARS-CoV-2 public health emergency.  Safety protocols were in place, including screening questions prior to the visit, additional usage of staff PPE, and extensive cleaning of exam room while  observing appropriate contact time as indicated for disinfecting solutions.

## 2020-08-07 NOTE — Telephone Encounter (Signed)
Pt needs to schedule an appointment in regards to medication refill request. Sw.

## 2020-08-26 ENCOUNTER — Ambulatory Visit
Admission: RE | Admit: 2020-08-26 | Discharge: 2020-08-26 | Disposition: A | Discharge status: Home / Self Care | Payer: Medicare HMO | Source: Ambulatory Visit | Attending: Family Medicine | Admitting: Family Medicine

## 2020-08-26 ENCOUNTER — Other Ambulatory Visit: Payer: Self-pay

## 2020-08-26 DIAGNOSIS — Z1231 Encounter for screening mammogram for malignant neoplasm of breast: Secondary | ICD-10-CM

## 2020-10-02 ENCOUNTER — Telehealth: Payer: Self-pay | Admitting: Nurse Practitioner

## 2020-10-02 DIAGNOSIS — E785 Hyperlipidemia, unspecified: Secondary | ICD-10-CM

## 2020-10-02 DIAGNOSIS — F419 Anxiety disorder, unspecified: Secondary | ICD-10-CM

## 2020-10-05 NOTE — Telephone Encounter (Signed)
Can we fill medication until upcoming appointment with no refills. I only see 3 medications that were prescribed by Dr. Loletha Grayer

## 2020-10-06 MED ORDER — EZETIMIBE 10 MG PO TABS: ORAL_TABLET | ORAL | 1 refills | Status: DC

## 2020-10-06 MED ORDER — ESCITALOPRAM OXALATE 10 MG PO TABS: 10.0000 mg | ORAL_TABLET | Freq: Every day | ORAL | 1 refills | Status: DC

## 2020-10-06 NOTE — Telephone Encounter (Signed)
LVM for patient to return call. 

## 2020-11-30 ENCOUNTER — Ambulatory Visit: Payer: Medicare HMO | Admitting: Family Medicine

## 2020-12-01 NOTE — Progress Notes (Signed)
    [name] Bartram T. Alamin Mccuiston, MD, [place] at [facility] [place], [address]  Phone: [phone]  FAX: [phone]  Kayla Mcgrath - 75 y.o. female  MRN [id number]  Date of Birth: 10/03/1945  Date: 12/02/2020  PCP: No primary care provider on file.  Referral: Owens Loffler, MD  Chief Complaint  Patient presents with   Knee Pain    Right Knee Injection    This visit occurred during the SARS-CoV-2 public health emergency.  Safety protocols were in place, including screening questions prior to the visit, additional usage of staff PPE, and extensive cleaning of exam room while observing appropriate contact time as indicated for disinfecting solutions.   Subjective:   Kayla Mcgrath is a 75 y.o. very pleasant female patient with Body mass index is 29.86 kg/m. who presents with the following:  F/u chronic pain from mod-severe R knee OA.  CBD cream. Helps some.   Aspiration/Injection Procedure Note Kayla Mcgrath 1945-04-05 Date of procedure: 12/02/2020  Procedure: Large Joint Aspiration / Injection of Knee, R Indications: Pain  Procedure Details Patient verbally consented to procedure. Risks, benefits, and alternatives explained. Sterilely prepped with Chloraprep. Ethyl cholride used for anesthesia. 9 cc Lidocaine 1% mixed with 1 mL of Kenalog 40 mg injected using the anteromedial approach without difficulty. No complications with procedure and tolerated well. Patient had decreased pain post-injection. Medication: 1 mL of Kenalog 40 mg   Signed,  Nashawn Hillock T. Herb Beltre, MD

## 2020-12-02 ENCOUNTER — Other Ambulatory Visit: Payer: Self-pay

## 2020-12-02 ENCOUNTER — Encounter: Payer: Self-pay | Admitting: Family Medicine

## 2020-12-02 ENCOUNTER — Ambulatory Visit (INDEPENDENT_AMBULATORY_CARE_PROVIDER_SITE_OTHER): Payer: Medicare HMO | Admitting: Family Medicine

## 2020-12-02 VITALS — BP 110/70 | HR 76 | Temp 97.9°F | Ht 62.0 in | Wt 163.2 lb

## 2020-12-02 DIAGNOSIS — M1711 Unilateral primary osteoarthritis, right knee: Secondary | ICD-10-CM

## 2020-12-02 MED ORDER — TRIAMCINOLONE ACETONIDE 40 MG/ML IJ SUSP
40.0000 mg | Freq: Once | INTRAMUSCULAR | Status: AC
  Administered 2020-12-02: 40 mg via INTRA_ARTICULAR

## 2020-12-02 NOTE — Addendum Note (Signed)
Addended by: Carter Kitten on: 12/02/2020 09:15 AM   Modules accepted: Orders

## 2021-01-15 ENCOUNTER — Ambulatory Visit (INDEPENDENT_AMBULATORY_CARE_PROVIDER_SITE_OTHER): Payer: Medicare HMO | Admitting: Nurse Practitioner

## 2021-01-15 ENCOUNTER — Encounter: Payer: Self-pay | Admitting: Nurse Practitioner

## 2021-01-15 ENCOUNTER — Other Ambulatory Visit: Payer: Self-pay

## 2021-01-15 VITALS — BP 120/70 | HR 86 | Temp 97.5°F | Ht 59.5 in | Wt 167.4 lb

## 2021-01-15 DIAGNOSIS — E785 Hyperlipidemia, unspecified: Secondary | ICD-10-CM | POA: Diagnosis not present

## 2021-01-15 DIAGNOSIS — G8929 Other chronic pain: Secondary | ICD-10-CM | POA: Diagnosis not present

## 2021-01-15 DIAGNOSIS — M1711 Unilateral primary osteoarthritis, right knee: Secondary | ICD-10-CM | POA: Diagnosis not present

## 2021-01-15 DIAGNOSIS — E210 Primary hyperparathyroidism: Secondary | ICD-10-CM

## 2021-01-15 DIAGNOSIS — F411 Generalized anxiety disorder: Secondary | ICD-10-CM | POA: Diagnosis not present

## 2021-01-15 DIAGNOSIS — M25561 Pain in right knee: Secondary | ICD-10-CM | POA: Diagnosis not present

## 2021-01-15 DIAGNOSIS — M8580 Other specified disorders of bone density and structure, unspecified site: Secondary | ICD-10-CM

## 2021-01-15 DIAGNOSIS — F419 Anxiety disorder, unspecified: Secondary | ICD-10-CM | POA: Insufficient documentation

## 2021-01-15 LAB — BASIC METABOLIC PANEL
BUN: 20 mg/dL (ref 6–23)
CO2: 30 mEq/L (ref 19–32)
Calcium: 9.4 mg/dL (ref 8.4–10.5)
Chloride: 101 mEq/L (ref 96–112)
Creatinine, Ser: 0.78 mg/dL (ref 0.40–1.20)
GFR: 75 mL/min (ref >60.00)
Glucose, Bld: 101 mg/dL — ABNORMAL HIGH (ref 70–99)
Potassium: 4.2 mEq/L (ref 3.5–5.1)
Sodium: 136 mEq/L (ref 135–145)

## 2021-01-15 LAB — LIPID PANEL
Cholesterol: 240 mg/dL — ABNORMAL HIGH (ref 0–200)
HDL: 67.6 mg/dL (ref >39.00)
LDL Cholesterol: 151 mg/dL — ABNORMAL HIGH (ref 0–99)
NonHDL: 172.28
Total CHOL/HDL Ratio: 4
Triglycerides: 105 mg/dL (ref 0.0–149.0)
VLDL: 21 mg/dL (ref 0.0–40.0)

## 2021-01-15 LAB — TSH: TSH: 3.12 u[IU]/mL (ref 0.35–5.50)

## 2021-01-15 MED ORDER — ESCITALOPRAM OXALATE 10 MG PO TABS: 10.0000 mg | ORAL_TABLET | Freq: Every day | ORAL | 3 refills | Status: DC

## 2021-01-15 MED ORDER — EZETIMIBE 10 MG PO TABS: ORAL_TABLET | ORAL | 1 refills | Status: DC

## 2021-01-15 MED ORDER — TRAMADOL HCL 50 MG PO TABS: 50.0000 mg | ORAL_TABLET | Freq: Four times a day (QID) | ORAL | 0 refills | Status: AC | PRN

## 2021-01-15 NOTE — Patient Instructions (Signed)
F/up with Dr. Lorelei Pont on knee pain and additional tramadol refill  Go to lab for blood draw

## 2021-01-15 NOTE — Assessment & Plan Note (Signed)
Under the care of Dr. [name] (sports medicine) Reports worsening knee pain post intra articular injection on 12/02/2020. current use of tramadol prn, last filled 07/2020 (60tabs)  No joint effusion or erythema today Provided 30tabs of tramadol Advised to f/up with sports medicine for additional refill and re eval of knee pain.

## 2021-01-15 NOTE — Assessment & Plan Note (Signed)
Stable mood with lexapro Refill sent 

## 2021-01-15 NOTE — Assessment & Plan Note (Signed)
Current use of zetia No statin due to fear of possible side effects. Repeat lipid panel to re eval cardiovascular risk

## 2021-01-15 NOTE — Progress Notes (Addendum)
Subjective:  Patient ID: Kayla Mcgrath, female    DOB: Aug 31, 1945  Age: 76 y.o. MRN: [id number]  CC: Establish Care (TOC-Dr. Cirigliano/Medication refills needed. )  HPI  Primary hyperparathyroidism ([place]) S/p parathyrodectomy and left thyroidectomy lobe in 2019. Repeat PTH and TSh  Primary osteoarthritis of right knee Under the care of Dr. [name] (sports medicine) Reports worsening knee pain post intra articular injection on 12/02/2020. current use of tramadol prn, last filled 07/2020 (60tabs)  No joint effusion or erythema today Provided 30tabs of tramadol Advised to f/up with sports medicine for additional refill and re eval of knee pain.  HLD (hyperlipidemia) Current use of zetia No statin due to fear of possible side effects. Repeat lipid panel to re eval cardiovascular risk  Anxiety Stable mood with lexapro Refill sent  Depression screen [facility] 2/9 01/15/2021 12/25/2019 11/27/2019  Decreased Interest 0 0 1  Down, Depressed, Hopeless 0 0 1  PHQ - 2 Score 0 0 2  Altered sleeping 1 0 1  Tired, decreased energy 1 0 1  Change in appetite 0 0 2  Feeling bad or failure about yourself  0 0 2  Trouble concentrating 0 0 0  Moving slowly or fidgety/restless 0 0 0  Suicidal thoughts 0 0 0  PHQ-9 Score 2 0 8  Difficult doing work/chores Not difficult at all Not difficult at all Somewhat difficult    GAD 7 : Generalized Anxiety Score 01/15/2021 12/25/2019 11/27/2019 12/05/2018  Nervous, Anxious, on Edge 1 1 1 3   Control/stop worrying 1 1 1 3   Worry too much - different things 2 1 2 3   Trouble relaxing 1 0 2 3  Restless 0 0 0 1  Easily annoyed or irritable 0 1 2 1   Afraid - awful might happen 2 2 3 3   Total GAD 7 Score 7 6 11 17   Anxiety Difficulty Somewhat difficult Somewhat difficult Somewhat difficult Somewhat difficult   Reviewed past Medical, Social and Family history today.  Outpatient Medications Prior to Visit  Medication Sig Dispense Refill   acetaminophen  (TYLENOL) 500 MG tablet Take 1,000 mg by mouth every 6 (six) hours as needed for mild pain or moderate pain.     Cholecalciferol (VITAMIN D) 2000 UNITS CAPS Take one by mouth daily     Famotidine (PEPCID PO) Take by mouth.     OVER THE COUNTER MEDICATION CBD Cream-Apply to knees at bedtime     Turmeric 400 MG CAPS Take 300 mg by mouth daily.     vitamin B-12 (CYANOCOBALAMIN) 1000 MCG tablet Take 1 tablet by mouth daily at 2 PM.     escitalopram (LEXAPRO) 10 MG tablet Take 1 tablet (10 mg total) by mouth daily. 90 tablet 1   ezetimibe (ZETIA) 10 MG tablet TAKE 1 TABLET EVERY DAY 90 tablet 1   traMADol (ULTRAM) 50 MG tablet TAKE 1 TABLET (50 MG TOTAL) BY MOUTH EVERY 6 (SIX) HOURS AS NEEDED. 60 tablet 0   No facility-administered medications prior to visit.    ROS See HPI  Objective:  BP 120/70 (BP Location: Right Arm, Patient Position: Sitting, Cuff Size: Large)    Pulse 86    Temp (!) 97.5 F (36.4 C) (Temporal)    Ht 4' 11.5" (1.511 m)    Wt 167 lb 6.4 oz (75.9 kg)    SpO2 98%    BMI 33.24 kg/m   Physical Exam Cardiovascular:     Rate and Rhythm: Normal rate and regular rhythm.  Pulses: Normal pulses.     Heart sounds: Normal heart sounds.  Pulmonary:     Effort: Pulmonary effort is normal.     Breath sounds: Normal breath sounds.  Musculoskeletal:        General: Tenderness present. No swelling or signs of injury.     Right lower leg: No edema.     Left lower leg: No edema.  Neurological:     Mental Status: She is alert and oriented to person, place, and time.  Psychiatric:        Mood and Affect: Mood normal.        Behavior: Behavior normal.        Thought Content: Thought content normal.   Assessment & Plan:  This visit occurred during the SARS-CoV-2 public health emergency.  Safety protocols were in place, including screening questions prior to the visit, additional usage of staff PPE, and extensive cleaning of exam room while observing appropriate contact time as  indicated for disinfecting solutions.   Kayla Mcgrath was seen today for establish care.  Diagnoses and all orders for this visit:  GAD (generalized anxiety disorder) -     escitalopram (LEXAPRO) 10 MG tablet; Take 1 tablet (10 mg total) by mouth daily.  Primary hyperparathyroidism ([facility]) -     Basic metabolic panel -     TSH -     PTH, intact (no Ca)  Chronic pain of right knee -     traMADol (ULTRAM) 50 MG tablet; Take 1 tablet (50 mg total) by mouth every 6 (six) hours as needed for up to 7 days.  Primary osteoarthritis of right knee -     Basic metabolic panel -     traMADol (ULTRAM) 50 MG tablet; Take 1 tablet (50 mg total) by mouth every 6 (six) hours as needed for up to 7 days.  Hyperlipidemia, unspecified hyperlipidemia type -     Lipid panel -     Basic metabolic panel -     ezetimibe (ZETIA) 10 MG tablet; TAKE 1 TABLET EVERY DAY  Osteopenia, unspecified location -     DG Bone Density; Future   Problem List Items Addressed This Visit       Endocrine   Primary hyperparathyroidism [facility])    S/p parathyrodectomy and left thyroidectomy lobe in 2019. Repeat PTH and TSh      Relevant Orders   Basic metabolic panel   TSH   PTH, intact (no Ca)     Musculoskeletal and Integument   Primary osteoarthritis of right knee    Under the care of Dr. [name] (sports medicine) Reports worsening knee pain post intra articular injection on 12/02/2020. current use of tramadol prn, last filled 07/2020 (60tabs)  No joint effusion or erythema today Provided 30tabs of tramadol Advised to f/up with sports medicine for additional refill and re eval of knee pain.      Relevant Medications   traMADol (ULTRAM) 50 MG tablet   Other Relevant Orders   Basic metabolic panel     Other   Anxiety - Primary    Stable mood with lexapro Refill sent      Relevant Medications   escitalopram (LEXAPRO) 10 MG tablet   Chronic pain of right knee   Relevant Medications   traMADol (ULTRAM) 50 MG  tablet   HLD (hyperlipidemia)    Current use of zetia No statin due to fear of possible side effects. Repeat lipid panel to re eval cardiovascular risk  Relevant Medications   ezetimibe (ZETIA) 10 MG tablet   Other Relevant Orders   Lipid panel   Basic metabolic panel   Other Visit Diagnoses     Osteopenia, unspecified location       Relevant Orders   DG Bone Density       Follow-up: Return in about 6 months (around 07/15/2021) for hyperlipidemia, GAD (fasting).  Wilfred Lacy, NP

## 2021-01-15 NOTE — Assessment & Plan Note (Addendum)
S/p parathyrodectomy and left thyroidectomy lobe in 2019. Repeat PTH and TSh

## 2021-01-18 ENCOUNTER — Telehealth: Payer: Self-pay | Admitting: Nurse Practitioner

## 2021-01-18 LAB — PARATHYROID HORMONE, INTACT (NO CA): PTH: 57 pg/mL (ref 16–77)

## 2021-01-18 NOTE — Telephone Encounter (Signed)
Documentation on lab results.

## 2021-01-18 NOTE — Telephone Encounter (Signed)
Pt is wanting a call back concerning her most recent lab results at [phone].

## 2021-01-20 ENCOUNTER — Encounter: Payer: Self-pay | Admitting: Nurse Practitioner

## 2021-01-22 MED ORDER — ROSUVASTATIN CALCIUM 20 MG PO TABS: ORAL_TABLET | ORAL | 3 refills | Status: DC

## 2021-01-22 NOTE — Addendum Note (Signed)
Addended by: Wilfred Lacy L on: 01/22/2021 04:14 PM   Modules accepted: Orders

## 2021-01-25 DIAGNOSIS — C4442 Squamous cell carcinoma of skin of scalp and neck: Secondary | ICD-10-CM | POA: Diagnosis not present

## 2021-01-25 DIAGNOSIS — D225 Melanocytic nevi of trunk: Secondary | ICD-10-CM | POA: Diagnosis not present

## 2021-01-25 DIAGNOSIS — L905 Scar conditions and fibrosis of skin: Secondary | ICD-10-CM | POA: Diagnosis not present

## 2021-01-25 DIAGNOSIS — L821 Other seborrheic keratosis: Secondary | ICD-10-CM | POA: Diagnosis not present

## 2021-02-02 ENCOUNTER — Ambulatory Visit (INDEPENDENT_AMBULATORY_CARE_PROVIDER_SITE_OTHER): Payer: Medicare HMO

## 2021-02-02 DIAGNOSIS — Z0000 Encounter for general adult medical examination without abnormal findings: Secondary | ICD-10-CM

## 2021-02-02 NOTE — Progress Notes (Signed)
Subjective:   Kayla Mcgrath is a 76 y.o. female who presents for Medicare Annual (Subsequent) preventive examination. I connected with Benjaman Kindler  today by telephone and verified that I am speaking with the correct person using two identifiers. Location patient: home Location provider: work Persons participating in the virtual visit: patient, provider.   I discussed the limitations, risks, security and privacy concerns of performing an evaluation and management service by telephone and the availability of in person appointments. I also discussed with the patient that there may be a patient responsible charge related to this service. The patient expressed understanding and verbally consented to this telephonic visit.    Interactive audio and video telecommunications were attempted between this provider and patient, however failed, due to patient having technical difficulties OR patient did not have access to video capability.  We continued and completed visit with audio only.    Review of Systems     Cardiac Risk Factors include: advanced age (>7men, >66 women);dyslipidemia     Objective:    Today's Vitals   There is no height or weight on file to calculate BMI.  Advanced Directives 02/02/2021 11/12/2019 11/07/2018 11/07/2017 11/07/2017 10/30/2017 06/21/2017  Does Patient Have a Medical Advance Directive? Yes Yes Yes Yes Yes Yes Yes  Type of Paramedic of [place];Living will [place];Living will [place];Living will Healthcare Power of [place];Living will  Does patient want to make changes to medical advance directive? - - No - Patient declined - No - Patient declined - -  Copy of [place] in Chart? No - copy requested No - copy requested No - copy requested No - copy requested No - copy requested No - copy  requested No - copy requested  Would patient like information on creating a medical advance directive? - - - - - - -  Pre-existing out of facility DNR order (yellow form or pink MOST form) - - - - - - -    Current Medications (verified) Outpatient Encounter Medications as of 02/02/2021  Medication Sig   acetaminophen (TYLENOL) 500 MG tablet Take 1,000 mg by mouth every 6 (six) hours as needed for mild pain or moderate pain.   Cholecalciferol (VITAMIN D) 2000 UNITS CAPS Take one by mouth daily   escitalopram (LEXAPRO) 10 MG tablet Take 1 tablet (10 mg total) by mouth daily.   Famotidine (PEPCID PO) Take by mouth.   OVER THE COUNTER MEDICATION CBD Cream-Apply to knees at bedtime   rosuvastatin (CRESTOR) 20 MG tablet Crestor 20mg  1x/week x 1week, then 2x/week x 1week, then 3x/week continuously.   Turmeric 400 MG CAPS Take 300 mg by mouth daily.   vitamin B-12 (CYANOCOBALAMIN) 1000 MCG tablet Take 1 tablet by mouth daily at 2 PM.   No facility-administered encounter medications on file as of 02/02/2021.    Allergies (verified) Latex   History: Past Medical History:  Diagnosis Date   Breast cancer ([place]) 2010   Colon polyps    ?   Complication of anesthesia    DCIS (ductal carcinoma in situ) of breast 2010   Diverticulosis    Full dentures    Hyperlipidemia    Joint pain    PONV (postoperative nausea and vomiting)    Skin cancer    Wears glasses    Past Surgical History:  Procedure Laterality Date   BREAST BIOPSY Right 05/11/2012  BREAST BIOPSY Right 04/24/2012   BREAST LUMPECTOMY Right december 2010   cancer-tamoxifen   BREAST RECONSTRUCTION WITH PLACEMENT OF TISSUE EXPANDER AND FLEX HD (ACELLULAR HYDRATED DERMIS) Right 06/20/2012   Procedure: BREAST RECONSTRUCTION WITH PLACEMENT OF TISSUE EXPANDER AND FLEX HD (ACELLULAR HYDRATED DERMIS);  Surgeon: Theodoro Kos, DO;  Location: [facility];  Service: Plastics;  Laterality: Right;   BREAST REDUCTION WITH MASTOPEXY  Left 10/11/2012   Procedure: LEFT BREAST REDUCTION WITH MASTOPEXY;  Surgeon: Theodoro Kos, DO;  Location: [facility];  Service: Plastics;  Laterality: Left;   cataract sugery Bilateral 03/2019   COLONOSCOPY  08/28/2013   [facility]   DILATION AND CURETTAGE OF UTERUS     LIPOSUCTION WITH LIPOFILLING Left 10/11/2012   Procedure: LIPOSUCTION WITH LIPOFILLING;  Surgeon: Theodoro Kos, DO;  Location: [facility];  Service: Plastics;  Laterality: Left;   MASTECTOMY Right 06/20/2012   MASTECTOMY W/ SENTINEL NODE BIOPSY Right 06/20/2012   Procedure: right skin sparing MASTECTOMY WITH SENTINEL LYMPH NODE BIOPSY;  Surgeon: Stark Klein, MD;  Location: [facility];  Service: General;  Laterality: Right;   PARATHYROIDECTOMY Left 11/07/2017   Procedure: NECK EXPLORATION WITH LEFT THYROID LOBECTOMY;  Surgeon: [name], MD;  Location: [facility];  Service: General;  Laterality: Left;   POLYPECTOMY     REDUCTION MAMMAPLASTY Left 10/2012   REMOVAL OF TISSUE EXPANDER AND PLACEMENT OF IMPLANT Right 10/11/2012   Procedure: REMOVAL OF TISSUE EXPANDER AND PLACEMENT OF SILICONE IMPLANT RIGHT;  Surgeon: Theodoro Kos, DO;  Location: [facility];  Service: Plastics;  Laterality: Right;   Family History  Problem Relation Age of Onset   Alcohol abuse Mother    Colon polyps Mother    Mental illness Mother    Alcohol abuse Father        suicide   Mental illness Father    Heart disease Paternal Grandfather    Colon cancer Neg Hx    Esophageal cancer Neg Hx    Rectal cancer Neg Hx    Stomach cancer Neg Hx    Social History   Socioeconomic History   Marital status: Married    Spouse name: Not on file   Number of children: 2   Years of education: Not on file   Highest education level: Not on file  Occupational History   Occupation: retired LPN  Tobacco Use   Smoking status: Former    Types: Cigarettes    Quit date: 06/14/1981    Years since quitting: 39.6    Smokeless tobacco: Never  Vaping Use   Vaping Use: Never used  Substance and Sexual Activity   Alcohol use: No   Drug use: No   Sexual activity: Yes    Birth control/protection: Post-menopausal  Other Topics Concern   Not on file  Social History Narrative   Recently moved from [place] to be closer to her two daughters and grandchildren.   Retired Corporate treasurer.   DNR- forms singed on 05/07/2012 and returned to pt.   Social Determinants of Health   Financial Resource Strain: Low Risk    Difficulty of Paying Living Expenses: Not hard at all  Food Insecurity: No Food Insecurity   Worried About Charity fundraiser in the Last Year: Never true   [place] in the Last Year: Never true  Transportation Needs: No Transportation Needs   Lack of Transportation (Medical): No   Lack of Transportation (Non-Medical): No  Physical Activity: Insufficiently  Active   Days of Exercise per Week: 3 days   Minutes of Exercise per Session: 30 min  Stress: No Stress Concern Present   Feeling of Stress : Not at all  Social Connections: Moderately Integrated   Frequency of Communication with Friends and Family: Twice a week   Frequency of Social Gatherings with Friends and Family: Twice a week   Attends Religious Services: Never   Marine scientist or Organizations: Yes   Attends Music therapist: More than 4 times per year   Marital Status: Married    Tobacco Counseling Counseling given: Not Answered   Clinical Intake:  Pre-visit preparation completed: Yes  Pain : No/denies pain     Nutritional Risks: None Diabetes: No  How often do you need to have someone help you when you read instructions, pamphlets, or other written materials from your doctor or pharmacy?: 1 - Never What is the last grade level you completed in school?: LPN  Diabetic?no   Interpreter Needed?: No  Information entered by :: L.Promyse Ardito,LPN   Activities of Daily Living In your present state of health,  do you have any difficulty performing the following activities: 02/02/2021  Hearing? N  Vision? N  Difficulty concentrating or making decisions? N  Walking or climbing stairs? N  Dressing or bathing? N  Doing errands, shopping? N  Preparing Food and eating ? N  Using the Toilet? N  In the past six months, have you accidently leaked urine? N  Do you have problems with loss of bowel control? N  Managing your Medications? N  Managing your Finances? N  Housekeeping or managing your Housekeeping? N  Some recent data might be hidden    Patient Care Team: Nche, Charlene Brooke, NP as PCP - General (Internal Medicine) Magrinat, Virgie Dad, MD as Consulting Physician (Oncology) Lafayette Dragon, MD (Inactive) as Consulting Physician (Gastroenterology) Owens Loffler, MD as Consulting Physician (Family Medicine)  Indicate any recent Medical Services you may have received from other than [facility] providers in the past year (date may be approximate).     Assessment:   This is a routine wellness examination for Kayla Mcgrath.  Hearing/Vision screen Vision Screening - Comments:: Annual eye exams wear glasses   Dietary issues and exercise activities discussed: Current Exercise Habits: The patient does not participate in regular exercise at present, Exercise limited by: orthopedic condition(s)   Goals Addressed             This Visit's Progress    Increase physical activity   On track      Depression Screen PHQ 2/9 Scores 02/02/2021 02/02/2021 01/15/2021 12/25/2019 11/27/2019 11/27/2019 11/12/2019  PHQ - 2 Score 0 0 0 0 2 4 1   PHQ- 9 Score - - 2 0 8 - -    Fall Risk Fall Risk  02/02/2021 01/15/2021 11/27/2019 11/12/2019 11/07/2018  Falls in the past year? 0 0 0 0 1  Number falls in past yr: 0 0 - 0 0  Injury with Fall? 0 0 - 0 0  Risk for fall due to : No Fall Risks No Fall Risks - - -  Follow up Falls evaluation completed Falls evaluation completed - Falls prevention discussed Education  provided;Falls prevention discussed  Comment cane - - - -    FALL RISK PREVENTION PERTAINING TO THE HOME:  Any stairs in or around the home? Yes  If so, are there any without handrails? No  Home free of loose throw rugs in walkways,  pet beds, electrical cords, etc? Yes  Adequate lighting in your home to reduce risk of falls? Yes   ASSISTIVE DEVICES UTILIZED TO PREVENT FALLS:  Life alert? No  Use of a cane, walker or w/c? No  Grab bars in the bathroom? Yes  Shower chair or bench in shower? Yes  Elevated toilet seat or a handicapped toilet? Yes    Cognitive Function:    Normal cognitive status assessed by direct observation by this Nurse Health Advisor. No abnormalities found.   6CIT Screen 04/05/2016  What Year? 0 points  What month? 0 points  What time? 0 points  Count back from 20 0 points  Months in reverse 0 points  Repeat phrase 0 points  Total Score 0    Immunizations Immunization History  Administered Date(s) Administered   Fluad Quad(high Dose 65+) 09/25/2018, 11/27/2019   Influenza, High Dose Seasonal PF 11/08/2017   Influenza,inj,Quad PF,6+ Mos 10/02/2013, 10/10/2016   Influenza-Unspecified 11/10/2012, 11/05/2020   PFIZER(Purple Top)SARS-COV-2 Vaccination 03/02/2019, 03/26/2019, 10/28/2019   Pfizer Covid-19 Vaccine Bivalent Booster 4yrs & up 11/05/2020   Pneumococcal Conjugate-13 05/30/2013   Pneumococcal Polysaccharide-23 05/07/2012   Td 05/30/2013   Zoster, Live 05/07/2012    TDAP status: Up to date  Flu Vaccine status: Up to date  Pneumococcal vaccine status: Up to date  Covid-19 vaccine status: Completed vaccines  Qualifies for Shingles Vaccine? Yes   Zostavax completed No   Shingrix Completed?: No.    Education has been provided regarding the importance of this vaccine. Patient has been advised to call insurance company to determine out of pocket expense if they have not yet received this vaccine. Advised may also receive vaccine at local  pharmacy or Health Dept. Verbalized acceptance and understanding.  Screening Tests Health Maintenance  Topic Date Due   Zoster Vaccines- Shingrix (1 of 2) Never done   MAMMOGRAM  08/26/2021   TETANUS/TDAP  05/31/2023   COLONOSCOPY (Pts 45-5yrs Insurance coverage will need to be confirmed)  01/19/2025   Pneumonia Vaccine 72+ Years old  Completed   INFLUENZA VACCINE  Completed   DEXA SCAN  Completed   COVID-19 Vaccine  Completed   Hepatitis C Screening  Completed   HPV VACCINES  Aged Out    Health Maintenance  Health Maintenance Due  Topic Date Due   Zoster Vaccines- Shingrix (1 of 2) Never done    Colorectal cancer screening: Type of screening: Colonoscopy. Completed 01/20/2020. Repeat every 5 years  Mammogram status: Completed 08/26/2020. Repeat every year  Bone Density status: Ordered 01/15/2021. Pt provided with contact info and advised to call to schedule appt.  Lung Cancer Screening: (Low Dose CT Chest recommended if Age 77-80 years, 30 pack-year currently smoking OR have quit w/in 15years.) does not qualify.   Lung Cancer Screening Referral: n/a  Additional Screening:  Hepatitis C Screening: does not qualify; Completed 06/29/2015  Vision Screening: Recommended annual ophthalmology exams for early detection of glaucoma and other disorders of the eye. Is the patient up to date with their annual eye exam?  No  Who is the provider or what is the name of the office in which the patient attends annual eye exams? [facility]  If pt is not established with a provider, would they like to be referred to a provider to establish care? No .   Dental Screening: Recommended annual dental exams for proper oral hygiene  Community Resource Referral / Chronic Care Management: CRR required this visit?  No   CCM required  this visit?  No      Plan:     I have personally reviewed and noted the following in the patients chart:   Medical and social history Use of alcohol,  tobacco or illicit drugs  Current medications and supplements including opioid prescriptions.  Functional ability and status Nutritional status Physical activity Advanced directives List of other physicians Hospitalizations, surgeries, and ER visits in previous 12 months Vitals Screenings to include cognitive, depression, and falls Referrals and appointments  In addition, I have reviewed and discussed with patient certain preventive protocols, quality metrics, and best practice recommendations. A written personalized care plan for preventive services as well as general preventive health recommendations were provided to patient.     Randel Pigg, LPN   5/52/0802   Nurse Notes: none

## 2021-02-02 NOTE — Patient Instructions (Signed)
Kayla Mcgrath , Thank you for taking time to come for your Medicare Wellness Visit. I appreciate your ongoing commitment to your health goals. Please review the following plan we discussed and let me know if I can assist you in the future.   Screening recommendations/referrals: Colonoscopy: 01/20/2020  Mammogram: 08/26/2020 Bone Density:  ordered 01/15/2021 Recommended yearly ophthalmology/optometry visit for glaucoma screening and checkup Recommended yearly dental visit for hygiene and checkup  Vaccinations: Influenza vaccine: completed  Pneumococcal vaccine: completed  Tdap vaccine: 05/30/2013 Shingles vaccine: will consider     Advanced directives: yes   Conditions/risks identified: none   Next appointment: none    Preventive Care 41 Years and Older, Female Preventive care refers to lifestyle choices and visits with your health care provider that can promote health and wellness. What does preventive care include? A yearly physical exam. This is also called an annual well check. Dental exams once or twice a year. Routine eye exams. Ask your health care provider how often you should have your eyes checked. Personal lifestyle choices, including: Daily care of your teeth and gums. Regular physical activity. Eating a healthy diet. Avoiding tobacco and drug use. Limiting alcohol use. Practicing safe sex. Taking low-dose aspirin every day. Taking vitamin and mineral supplements as recommended by your health care provider. What happens during an annual well check? The services and screenings done by your health care provider during your annual well check will depend on your age, overall health, lifestyle risk factors, and family history of disease. Counseling  Your health care provider may ask you questions about your: Alcohol use. Tobacco use. Drug use. Emotional well-being. Home and relationship well-being. Sexual activity. Eating habits. History of falls. Memory and  ability to understand (cognition). Work and work Statistician. Reproductive health. Screening  You may have the following tests or measurements: Height, weight, and BMI. Blood pressure. Lipid and cholesterol levels. These may be checked every 5 years, or more frequently if you are over 26 years old. Skin check. Lung cancer screening. You may have this screening every year starting at age 4 if you have a 30-pack-year history of smoking and currently smoke or have quit within the past 15 years. Fecal occult blood test (FOBT) of the stool. You may have this test every year starting at age 88. Flexible sigmoidoscopy or colonoscopy. You may have a sigmoidoscopy every 5 years or a colonoscopy every 10 years starting at age 70. Hepatitis C blood test. Hepatitis B blood test. Sexually transmitted disease (STD) testing. Diabetes screening. This is done by checking your blood sugar (glucose) after you have not eaten for a while (fasting). You may have this done every 1-3 years. Bone density scan. This is done to screen for osteoporosis. You may have this done starting at age 33. Mammogram. This may be done every 1-2 years. Talk to your health care provider about how often you should have regular mammograms. Talk with your health care provider about your test results, treatment options, and if necessary, the need for more tests. Vaccines  Your health care provider may recommend certain vaccines, such as: Influenza vaccine. This is recommended every year. Tetanus, diphtheria, and acellular pertussis (Tdap, Td) vaccine. You may need a Td booster every 10 years. Zoster vaccine. You may need this after age 1. Pneumococcal 13-valent conjugate (PCV13) vaccine. One dose is recommended after age 25. Pneumococcal polysaccharide (PPSV23) vaccine. One dose is recommended after age 50. Talk to your health care provider about which screenings and vaccines you need  and how often you need them. This information is  not intended to replace advice given to you by your health care provider. Make sure you discuss any questions you have with your health care provider. Document Released: 01/23/2015 Document Revised: 09/16/2015 Document Reviewed: 10/28/2014 Elsevier Interactive Patient Education  2017 [place] Prevention in the Home Falls can cause injuries. They can happen to people of all ages. There are many things you can do to make your home safe and to help prevent falls. What can I do on the outside of my home? Regularly fix the edges of walkways and driveways and fix any cracks. Remove anything that might make you trip as you walk through a door, such as a raised step or threshold. Trim any bushes or trees on the path to your home. Use bright outdoor lighting. Clear any walking paths of anything that might make someone trip, such as rocks or tools. Regularly check to see if handrails are loose or broken. Make sure that both sides of any steps have handrails. Any raised decks and porches should have guardrails on the edges. Have any leaves, snow, or ice cleared regularly. Use sand or salt on walking paths during winter. Clean up any spills in your garage right away. This includes oil or grease spills. What can I do in the bathroom? Use night lights. Install grab bars by the toilet and in the tub and shower. Do not use towel bars as grab bars. Use non-skid mats or decals in the tub or shower. If you need to sit down in the shower, use a plastic, non-slip stool. Keep the floor dry. Clean up any water that spills on the floor as soon as it happens. Remove soap buildup in the tub or shower regularly. Attach bath mats securely with double-sided non-slip rug tape. Do not have throw rugs and other things on the floor that can make you trip. What can I do in the bedroom? Use night lights. Make sure that you have a light by your bed that is easy to reach. Do not use any sheets or blankets that  are too big for your bed. They should not hang down onto the floor. Have a firm chair that has side arms. You can use this for support while you get dressed. Do not have throw rugs and other things on the floor that can make you trip. What can I do in the kitchen? Clean up any spills right away. Avoid walking on wet floors. Keep items that you use a lot in easy-to-reach places. If you need to reach something above you, use a strong step stool that has a grab bar. Keep electrical cords out of the way. Do not use floor polish or wax that makes floors slippery. If you must use wax, use non-skid floor wax. Do not have throw rugs and other things on the floor that can make you trip. What can I do with my stairs? Do not leave any items on the stairs. Make sure that there are handrails on both sides of the stairs and use them. Fix handrails that are broken or loose. Make sure that handrails are as long as the stairways. Check any carpeting to make sure that it is firmly attached to the stairs. Fix any carpet that is loose or worn. Avoid having throw rugs at the top or bottom of the stairs. If you do have throw rugs, attach them to the floor with carpet tape. Make sure that you  have a light switch at the top of the stairs and the bottom of the stairs. If you do not have them, ask someone to add them for you. What else can I do to help prevent falls? Wear shoes that: Do not have high heels. Have rubber bottoms. Are comfortable and fit you well. Are closed at the toe. Do not wear sandals. If you use a stepladder: Make sure that it is fully opened. Do not climb a closed stepladder. Make sure that both sides of the stepladder are locked into place. Ask someone to hold it for you, if possible. Clearly mark and make sure that you can see: Any grab bars or handrails. First and last steps. Where the edge of each step is. Use tools that help you move around (mobility aids) if they are needed. These  include: Canes. Walkers. Scooters. Crutches. Turn on the lights when you go into a dark area. Replace any light bulbs as soon as they burn out. Set up your furniture so you have a clear path. Avoid moving your furniture around. If any of your floors are uneven, fix them. If there are any pets around you, be aware of where they are. Review your medicines with your doctor. Some medicines can make you feel dizzy. This can increase your chance of falling. Ask your doctor what other things that you can do to help prevent falls. This information is not intended to replace advice given to you by your health care provider. Make sure you discuss any questions you have with your health care provider. Document Released: 10/23/2008 Document Revised: 06/04/2015 Document Reviewed: 01/31/2014 Elsevier Interactive Patient Education  2017 [employer].

## 2021-02-22 DIAGNOSIS — Z85828 Personal history of other malignant neoplasm of skin: Secondary | ICD-10-CM | POA: Diagnosis not present

## 2021-02-22 DIAGNOSIS — Z08 Encounter for follow-up examination after completed treatment for malignant neoplasm: Secondary | ICD-10-CM | POA: Diagnosis not present

## 2021-03-30 DIAGNOSIS — Z0100 Encounter for examination of eyes and vision without abnormal findings: Secondary | ICD-10-CM | POA: Diagnosis not present

## 2021-03-30 DIAGNOSIS — H5200 Hypermetropia, unspecified eye: Secondary | ICD-10-CM | POA: Diagnosis not present

## 2021-06-24 ENCOUNTER — Ambulatory Visit
Admission: RE | Admit: 2021-06-24 | Discharge: 2021-06-24 | Disposition: A | Discharge status: Home / Self Care | Payer: Medicare HMO | Source: Ambulatory Visit | Attending: Nurse Practitioner | Admitting: Nurse Practitioner

## 2021-06-24 DIAGNOSIS — M8580 Other specified disorders of bone density and structure, unspecified site: Secondary | ICD-10-CM

## 2021-06-24 DIAGNOSIS — Z780 Asymptomatic menopausal state: Secondary | ICD-10-CM | POA: Diagnosis not present

## 2021-06-24 DIAGNOSIS — M8589 Other specified disorders of bone density and structure, multiple sites: Secondary | ICD-10-CM | POA: Diagnosis not present

## 2021-07-16 ENCOUNTER — Ambulatory Visit: Payer: Medicare HMO | Admitting: Nurse Practitioner

## 2021-07-19 ENCOUNTER — Ambulatory Visit (INDEPENDENT_AMBULATORY_CARE_PROVIDER_SITE_OTHER): Payer: Medicare HMO | Admitting: Nurse Practitioner

## 2021-07-19 ENCOUNTER — Encounter: Payer: Self-pay | Admitting: Nurse Practitioner

## 2021-07-19 ENCOUNTER — Telehealth: Payer: Self-pay | Admitting: *Deleted

## 2021-07-19 VITALS — BP 110/60 | HR 79 | Temp 97.9°F | Ht 59.5 in | Wt 161.2 lb

## 2021-07-19 DIAGNOSIS — Z853 Personal history of malignant neoplasm of breast: Secondary | ICD-10-CM | POA: Diagnosis not present

## 2021-07-19 DIAGNOSIS — Z636 Dependent relative needing care at home: Secondary | ICD-10-CM | POA: Diagnosis not present

## 2021-07-19 DIAGNOSIS — N3000 Acute cystitis without hematuria: Secondary | ICD-10-CM

## 2021-07-19 DIAGNOSIS — R739 Hyperglycemia, unspecified: Secondary | ICD-10-CM | POA: Diagnosis not present

## 2021-07-19 DIAGNOSIS — E785 Hyperlipidemia, unspecified: Secondary | ICD-10-CM

## 2021-07-19 LAB — POCT URINALYSIS DIPSTICK
Glucose, UA: NEGATIVE
Ketones, UA: NEGATIVE
Nitrite, UA: NEGATIVE
Protein, UA: POSITIVE — AB
Spec Grav, UA: 1.015 (ref 1.010–1.025)
Urobilinogen, UA: 0.2 E.U./dL
pH, UA: 5 (ref 5.0–8.0)

## 2021-07-19 LAB — LIPID PANEL
Cholesterol: 156 mg/dL (ref 0–200)
HDL: 55.2 mg/dL (ref >39.00)
LDL Cholesterol: 83 mg/dL (ref 0–99)
NonHDL: 100.45
Total CHOL/HDL Ratio: 3
Triglycerides: 85 mg/dL (ref 0.0–149.0)
VLDL: 17 mg/dL (ref 0.0–40.0)

## 2021-07-19 LAB — HEMOGLOBIN A1C: Hgb A1c MFr Bld: 5.9 % (ref 4.6–6.5)

## 2021-07-19 MED ORDER — ROSUVASTATIN CALCIUM 20 MG PO TABS: 20.0000 mg | ORAL_TABLET | ORAL | 3 refills | Status: DC

## 2021-07-19 MED ORDER — SULFAMETHOXAZOLE-TRIMETHOPRIM 800-160 MG PO TABS: 1.0000 | ORAL_TABLET | Freq: Two times a day (BID) | ORAL | 0 refills | Status: DC

## 2021-07-19 NOTE — Assessment & Plan Note (Signed)
Denies any adverse effects with crestor dosed 3x/week. Repeat lipid panel Maintain med dose

## 2021-07-19 NOTE — Assessment & Plan Note (Signed)
She reports difficulty caring for her husband with Dementia. She will like to talk to a Education officer, museum to assist with community resources.

## 2021-07-19 NOTE — Telephone Encounter (Signed)
   Telephone encounter was:  Unsuccessful.  07/19/2021 Name: Kayla Mcgrath MRN: [id number] DOB: 1945/08/17  Unsuccessful outbound call made today to assist with:  Caregiver Stress  Outreach Attempt:  1st Attempt  A HIPAA compliant voice message was left requesting a return call.  Instructed patient to call back at   Instructed patient to call back at [phone]  at their earliest convenience.   [place], Care Management  [phone] [address] , [place] [address] Email : [email or web address]

## 2021-07-19 NOTE — Progress Notes (Signed)
Established Patient Visit  Patient: Kayla Mcgrath   DOB: 1945/06/26   76 y.o. Female  MRN: [id number] Visit Date: 07/19/2021  Subjective:    Chief Complaint  Patient presents with   Office Visit    6 month f/u Hyperlipidemia Possible UTI; abd pain, frequent urination. No other concerns    Urinary Tract Infection  This is a new problem. The current episode started in the past 7 days. The problem occurs every urination. The problem has been unchanged. The quality of the pain is described as burning. She is Not sexually active. There is No history of pyelonephritis. Associated symptoms include urgency. Pertinent negatives include no chills, discharge, flank pain, hematuria, hesitancy, nausea, possible pregnancy, sweats or vomiting. She has tried nothing for the symptoms.   HLD (hyperlipidemia) Denies any adverse effects with crestor dosed 3x/week. Repeat lipid panel Maintain med dose  Hyperglycemia Repeat hgbA1c  Caregiver stress She reports difficulty caring for her husband with Dementia. She will like to talk to a Education officer, museum to assist with community resources.  Reviewed medical, surgical, and social history today  Medications: Outpatient Medications Prior to Visit  Medication Sig   Cholecalciferol (VITAMIN D) 2000 UNITS CAPS Take one by mouth daily   escitalopram (LEXAPRO) 10 MG tablet Take 1 tablet (10 mg total) by mouth daily.   Famotidine (PEPCID PO) Take by mouth.   OVER THE COUNTER MEDICATION CBD Cream-Apply to knees at bedtime   Turmeric 400 MG CAPS Take 300 mg by mouth daily.   vitamin B-12 (CYANOCOBALAMIN) 1000 MCG tablet Take 1 tablet by mouth daily at 2 PM.   [DISCONTINUED] rosuvastatin (CRESTOR) 20 MG tablet Crestor '20mg'$  1x/week x 1week, then 2x/week x 1week, then 3x/week continuously.   [DISCONTINUED] acetaminophen (TYLENOL) 500 MG tablet Take 1,000 mg by mouth every 6 (six) hours as needed for mild pain or moderate pain. (Patient not taking:  Reported on 07/19/2021)   No facility-administered medications prior to visit.   Reviewed past medical and social history.   ROS per HPI above      Objective:  BP 110/60 (BP Location: Right Arm, Patient Position: Sitting, Cuff Size: Normal)   Pulse 79   Temp 97.9 F (36.6 C) (Temporal)   Ht 4' 11.5" (1.511 m)   Wt 161 lb 3.2 oz (73.1 kg)   SpO2 98%   BMI 32.01 kg/m      Physical Exam Vitals reviewed.  Cardiovascular:     Rate and Rhythm: Normal rate.     Pulses: Normal pulses.  Pulmonary:     Effort: Pulmonary effort is normal.  Abdominal:     General: Bowel sounds are normal.     Palpations: Abdomen is soft.  Neurological:     Mental Status: She is alert and oriented to person, place, and time.     Results for orders placed or performed in visit on 07/19/21  POCT Urinalysis Dipstick  Result Value Ref Range   Color, UA Yellow    Clarity, UA clear    Glucose, UA Negative Negative   Bilirubin, UA 1+    Ketones, UA Negative    Spec Grav, UA 1.015 1.010 - 1.025   Blood, UA 1+    pH, UA 5.0 5.0 - 8.0   Protein, UA Positive (A) Negative   Urobilinogen, UA 0.2 0.2 or 1.0 E.U./dL   Nitrite, UA Negative    Leukocytes, UA Small (1+) (A)  Negative   Appearance Clear    Odor None       Assessment & Plan:    Problem List Items Addressed This Visit       Other   Caregiver stress    She reports difficulty caring for her husband with Dementia. She will like to talk to a Education officer, museum to assist with community resources.      Relevant Orders   AMB Referral to [place] (hyperlipidemia)    Denies any adverse effects with crestor dosed 3x/week. Repeat lipid panel Maintain med dose      Relevant Medications   rosuvastatin (CRESTOR) 20 MG tablet   Other Relevant Orders   Lipid panel   Hyperglycemia    Repeat hgbA1c      Relevant Orders   Hemoglobin A1c   RESOLVED: Primary cancer of upper inner quadrant of right female breast (HCC)    Relevant Medications   sulfamethoxazole-trimethoprim (BACTRIM DS) 800-160 MG tablet   Other Visit Diagnoses     Acute cystitis without hematuria    -  Primary   Relevant Medications   sulfamethoxazole-trimethoprim (BACTRIM DS) 800-160 MG tablet   Other Relevant Orders   POCT Urinalysis Dipstick (Completed)   Urine Culture      Return in about 6 months (around 01/19/2022) for hyperlipidemia (fasting).     Wilfred Lacy, NP

## 2021-07-19 NOTE — Assessment & Plan Note (Signed)
Repeat hgbA1c 

## 2021-07-19 NOTE — Patient Instructions (Addendum)
Maintain crestor dose Go to lab Start bactrim If urinary urgency does not improve with bactrim, will need to start overactive bladder medication. Vibegron is not covered by your insurance at this time, so we will need to try ditropan or detrol if needed.

## 2021-07-21 LAB — URINE CULTURE
MICRO NUMBER:: 13625185
SPECIMEN QUALITY:: ADEQUATE

## 2021-07-26 ENCOUNTER — Other Ambulatory Visit: Payer: Self-pay | Admitting: Nurse Practitioner

## 2021-07-26 DIAGNOSIS — Z1231 Encounter for screening mammogram for malignant neoplasm of breast: Secondary | ICD-10-CM

## 2021-07-28 ENCOUNTER — Telehealth: Payer: Self-pay | Admitting: *Deleted

## 2021-07-28 NOTE — Telephone Encounter (Signed)
   Telephone encounter was:  Unsuccessful.  07/28/2021 Name: ARIZBETH CAWTHORN MRN: [id number] DOB: Jul 03, 1945  Unsuccessful outbound call made today to assist with:  Caregiver Stress  Outreach Attempt:  2nd Attempt  A HIPAA compliant voice message was left requesting a return call.  Instructed patient to call back at   Instructed patient to call back at [phone]  at their earliest convenience. .  [place], Care Management  [phone] [address] , [place] [address] Email : [email or web address]

## 2021-08-02 ENCOUNTER — Telehealth: Payer: Self-pay | Admitting: *Deleted

## 2021-08-02 NOTE — Telephone Encounter (Signed)
   Telephone encounter was:  Successful.  08/02/2021 Name: KYNZLIE HILLEARY MRN: [id number] DOB: April 30, 1945  Elray Mcgregor is a 76 y.o. year old female who is a primary care patient of [place], Charlene Brooke, NP . The community resource team was consulted for assistance with Caregiver Stress  Care guide performed the following interventions: Patient provided with information about care guide support team and interviewed to confirm resource needs Discussed resources to assist with Dementia . Patient looking for support groups for husband with dementia , sent [facility] care givers information , [facility] respite and listing of support groups for Alzheimer's and dementia   Follow Up Plan:  No further follow up planned at this time. The patient has been provided with needed resources.  [place], Care Management  [phone] [address] , [place] [address] Email : [email or web address]

## 2021-08-23 DIAGNOSIS — Z08 Encounter for follow-up examination after completed treatment for malignant neoplasm: Secondary | ICD-10-CM | POA: Diagnosis not present

## 2021-08-23 DIAGNOSIS — Z85828 Personal history of other malignant neoplasm of skin: Secondary | ICD-10-CM | POA: Diagnosis not present

## 2021-08-31 ENCOUNTER — Ambulatory Visit
Admission: RE | Admit: 2021-08-31 | Discharge: 2021-08-31 | Disposition: A | Discharge status: Home / Self Care | Payer: Medicare HMO | Source: Ambulatory Visit | Attending: Nurse Practitioner | Admitting: Nurse Practitioner

## 2021-08-31 DIAGNOSIS — Z1231 Encounter for screening mammogram for malignant neoplasm of breast: Secondary | ICD-10-CM | POA: Diagnosis not present

## 2021-09-23 ENCOUNTER — Other Ambulatory Visit (HOSPITAL_COMMUNITY)
Admission: RE | Admit: 2021-09-23 | Discharge: 2021-09-23 | Disposition: A | Discharge status: Home / Self Care | Payer: Medicare HMO | Source: Ambulatory Visit | Attending: Nurse Practitioner | Admitting: Nurse Practitioner

## 2021-09-23 ENCOUNTER — Ambulatory Visit (INDEPENDENT_AMBULATORY_CARE_PROVIDER_SITE_OTHER): Payer: Medicare HMO | Admitting: Nurse Practitioner

## 2021-09-23 ENCOUNTER — Encounter: Payer: Self-pay | Admitting: Nurse Practitioner

## 2021-09-23 VITALS — BP 118/74 | HR 86 | Temp 95.6°F | Ht 59.5 in | Wt 156.6 lb

## 2021-09-23 DIAGNOSIS — Z853 Personal history of malignant neoplasm of breast: Secondary | ICD-10-CM

## 2021-09-23 DIAGNOSIS — N950 Postmenopausal bleeding: Secondary | ICD-10-CM | POA: Diagnosis not present

## 2021-09-23 NOTE — Patient Instructions (Addendum)
Ok to use naproxen or ibuprofen for pain

## 2021-09-23 NOTE — Progress Notes (Signed)
Established Patient Visit  Patient: Kayla Mcgrath   DOB: Mar 07, 1945   76 y.o. Female  MRN: [id number] Visit Date: 09/23/2021  Subjective:    Chief Complaint  Patient presents with   Acute Visit    C/o of intermittent lower abd cramping w/ Increased urination & spotting on occasion No other concerns  Pt unable to give urine sample at this time   Vaginal Bleeding The patient's primary symptoms include pelvic pain and vaginal bleeding. The patient's pertinent negatives include no genital itching, genital lesions, genital odor, genital rash, missed menses or vaginal discharge. This is a new problem. The current episode started in the past 7 days. The problem occurs intermittently. The problem has been resolved. The pain is moderate. The problem affects both sides. She is not pregnant. Associated symptoms include abdominal pain and frequency. Pertinent negatives include no back pain, chills, painful intercourse or urgency. The vaginal discharge was bloody. The vaginal bleeding is spotting. She has not been passing clots. She has not been passing tissue. Nothing aggravates the symptoms. She has tried NSAIDs for the symptoms. The treatment provided significant relief. She is not sexually active. She uses abstinence for contraception. She is postmenopausal. There is no history of endometriosis, a gynecological surgery, PID or vaginosis.  Vaginal bleeding x 4days, now resolved. Not sexually active  Reviewed medical, surgical, and social history today Past Medical History:  Diagnosis Date   Adjustment disorder with anxious mood 12/05/2018   Arthralgia 06/29/2015   Breast cancer ([facility]) 2010   Colon polyps    ?   Complication of anesthesia    DCIS (ductal carcinoma in situ) of breast 2010   Diverticulosis    Full dentures    Hyperlipidemia    Joint pain    Leukocytosis 07/11/2017   PONV (postoperative nausea and vomiting)    Skin cancer    Wears glasses      Medications: Outpatient Medications Prior to Visit  Medication Sig   Cholecalciferol (VITAMIN D) 2000 UNITS CAPS Take one by mouth daily   escitalopram (LEXAPRO) 10 MG tablet Take 1 tablet (10 mg total) by mouth daily.   Famotidine (PEPCID PO) Take by mouth.   OVER THE COUNTER MEDICATION CBD Cream-Apply to knees at bedtime   rosuvastatin (CRESTOR) 20 MG tablet Take 1 tablet (20 mg total) by mouth 3 (three) times a week.   Turmeric 400 MG CAPS Take 300 mg by mouth daily.   vitamin B-12 (CYANOCOBALAMIN) 1000 MCG tablet Take 1 tablet by mouth daily at 2 PM.   [DISCONTINUED] sulfamethoxazole-trimethoprim (BACTRIM DS) 800-160 MG tablet Take 1 tablet by mouth 2 (two) times daily. (Patient not taking: Reported on 09/23/2021)   No facility-administered medications prior to visit.   Reviewed past medical and social history.   ROS per HPI above      Objective:  BP 118/74 (BP Location: Left Arm, Patient Position: Sitting, Cuff Size: Small)   Pulse 86   Temp (!) 95.6 F (35.3 C) (Temporal)   Ht 4' 11.5" (1.511 m)   Wt 156 lb 9.6 oz (71 kg)   SpO2 94%   BMI 31.10 kg/m      Physical Exam Vitals reviewed. Exam conducted with a chaperone present.  Constitutional:      General: She is not in acute distress. Genitourinary:    General: Normal vulva.     Exam position: Lithotomy position.     Labia:  Right: No rash, tenderness or lesion.        Left: No rash, tenderness or lesion.      Urethra: Prolapse present. No urethral pain, urethral swelling or urethral lesion.     Vagina: Bleeding present. No vaginal discharge, erythema or tenderness.     Cervix: Cervical bleeding present. No cervical motion tenderness, discharge, friability, lesion or erythema.     Uterus: Normal.      Adnexa: Right adnexa normal and left adnexa normal.  Lymphadenopathy:     Lower Body: No right inguinal adenopathy. No left inguinal adenopathy.  Neurological:     Mental Status: She is alert.     No  results found for any visits on 09/23/21.    Assessment & Plan:    Problem List Items Addressed This Visit       Other   History of breast cancer in female   Other Visit Diagnoses     Postmenopausal vaginal bleeding    -  Primary   Relevant Orders   US Pelvic Complete With Transvaginal   Cervicovaginal ancillary only( [facility])      Return if symptoms worsen or fail to improve.     Wilfred Lacy, NP

## 2021-09-24 LAB — CERVICOVAGINAL ANCILLARY ONLY
Bacterial Vaginitis (gardnerella): NEGATIVE
Candida Glabrata: NEGATIVE
Candida Vaginitis: NEGATIVE
Chlamydia: NEGATIVE
Comment: NEGATIVE
Comment: NEGATIVE
Comment: NEGATIVE
Comment: NEGATIVE
Comment: NEGATIVE
Comment: NORMAL
Neisseria Gonorrhea: NEGATIVE
Trichomonas: NEGATIVE

## 2021-09-28 ENCOUNTER — Telehealth: Payer: Self-pay | Admitting: Nurse Practitioner

## 2021-09-28 ENCOUNTER — Ambulatory Visit
Admission: RE | Admit: 2021-09-28 | Discharge: 2021-09-28 | Disposition: A | Discharge status: Home / Self Care | Payer: Medicare HMO | Source: Ambulatory Visit | Attending: Nurse Practitioner | Admitting: Nurse Practitioner

## 2021-09-28 DIAGNOSIS — N950 Postmenopausal bleeding: Secondary | ICD-10-CM

## 2021-09-28 DIAGNOSIS — D251 Intramural leiomyoma of uterus: Secondary | ICD-10-CM

## 2021-09-28 DIAGNOSIS — N852 Hypertrophy of uterus: Secondary | ICD-10-CM | POA: Diagnosis not present

## 2021-09-28 DIAGNOSIS — D259 Leiomyoma of uterus, unspecified: Secondary | ICD-10-CM | POA: Diagnosis not present

## 2021-09-28 NOTE — Telephone Encounter (Signed)
Entered Gyn referral

## 2021-10-13 ENCOUNTER — Other Ambulatory Visit: Payer: Self-pay | Admitting: Nurse Practitioner

## 2021-10-13 DIAGNOSIS — E785 Hyperlipidemia, unspecified: Secondary | ICD-10-CM

## 2021-10-13 NOTE — Telephone Encounter (Signed)
Chart supports Rx Last OV: 09/2021 Next OV: not scheduled  

## 2021-10-20 ENCOUNTER — Telehealth: Payer: Self-pay | Admitting: Nurse Practitioner

## 2021-10-20 DIAGNOSIS — D251 Intramural leiomyoma of uterus: Secondary | ICD-10-CM

## 2021-10-20 DIAGNOSIS — N950 Postmenopausal bleeding: Secondary | ICD-10-CM

## 2021-10-20 NOTE — Addendum Note (Signed)
Addended by: Wilfred Lacy L on: 10/20/2021 04:07 PM   Modules accepted: Orders

## 2021-10-20 NOTE — Telephone Encounter (Signed)
Please advise, see below.   

## 2021-10-20 NOTE — Telephone Encounter (Signed)
Pt was referred for a Gyno app. For bleeding and this has been scheduled for the end of November. She is wondering if it's OK how much she is bleeding and if it will could  hurt her. If she could get a script to help with the abd pain at times. Please advise pt @ [phone]

## 2021-10-21 NOTE — Telephone Encounter (Signed)
Pt called to check status of message below. Pt was advised of Charlotte's recommendations. She is on the wait/cancellation list for OBGYN. Pt will come in 10/13 for labwork.

## 2021-10-22 ENCOUNTER — Other Ambulatory Visit (INDEPENDENT_AMBULATORY_CARE_PROVIDER_SITE_OTHER): Payer: Medicare HMO

## 2021-10-22 DIAGNOSIS — D251 Intramural leiomyoma of uterus: Secondary | ICD-10-CM | POA: Diagnosis not present

## 2021-10-22 DIAGNOSIS — N950 Postmenopausal bleeding: Secondary | ICD-10-CM

## 2021-10-22 LAB — CBC WITH DIFFERENTIAL/PLATELET
Basophils Absolute: 0.1 10*3/uL (ref 0.0–0.1)
Basophils Relative: 1 % (ref 0.0–3.0)
Eosinophils Absolute: 0.2 10*3/uL (ref 0.0–0.7)
Eosinophils Relative: 3.8 % (ref 0.0–5.0)
HCT: 34.4 % — ABNORMAL LOW (ref 36.0–46.0)
Hemoglobin: 11.2 g/dL — ABNORMAL LOW (ref 12.0–15.0)
Lymphocytes Relative: 21.6 % (ref 12.0–46.0)
Lymphs Abs: 1.1 10*3/uL (ref 0.7–4.0)
MCHC: 32.6 g/dL (ref 30.0–36.0)
MCV: 91.3 fl (ref 78.0–100.0)
Monocytes Absolute: 0.5 10*3/uL (ref 0.1–1.0)
Monocytes Relative: 9 % (ref 3.0–12.0)
Neutro Abs: 3.4 10*3/uL (ref 1.4–7.7)
Neutrophils Relative %: 64.6 % (ref 43.0–77.0)
Platelets: 266 10*3/uL (ref 150.0–400.0)
RBC: 3.77 Mil/uL — ABNORMAL LOW (ref 3.87–5.11)
RDW: 14.4 % (ref 11.5–15.5)
WBC: 5.3 10*3/uL (ref 4.0–10.5)

## 2021-10-23 LAB — IRON,TIBC AND FERRITIN PANEL
%SAT: 30 % (calc) (ref 16–45)
Ferritin: 159 ng/mL (ref 16–288)
Iron: 87 ug/dL (ref 45–160)
TIBC: 293 mcg/dL (calc) (ref 250–450)

## 2021-11-10 DIAGNOSIS — C55 Malignant neoplasm of uterus, part unspecified: Secondary | ICD-10-CM

## 2021-11-10 HISTORY — DX: Malignant neoplasm of uterus, part unspecified: C55

## 2021-11-11 ENCOUNTER — Other Ambulatory Visit (HOSPITAL_COMMUNITY)
Admission: RE | Admit: 2021-11-11 | Discharge: 2021-11-11 | Disposition: A | Discharge status: Home / Self Care | Payer: Medicare HMO | Source: Ambulatory Visit | Attending: Obstetrics and Gynecology | Admitting: Obstetrics and Gynecology

## 2021-11-11 ENCOUNTER — Ambulatory Visit: Payer: Medicare HMO | Admitting: Obstetrics and Gynecology

## 2021-11-11 ENCOUNTER — Encounter: Payer: Self-pay | Admitting: Obstetrics and Gynecology

## 2021-11-11 VITALS — BP 131/82 | HR 83 | Ht 60.0 in | Wt 153.0 lb

## 2021-11-11 DIAGNOSIS — N888 Other specified noninflammatory disorders of cervix uteri: Secondary | ICD-10-CM

## 2021-11-11 DIAGNOSIS — C541 Malignant neoplasm of endometrium: Secondary | ICD-10-CM

## 2021-11-11 DIAGNOSIS — N950 Postmenopausal bleeding: Secondary | ICD-10-CM

## 2021-11-11 DIAGNOSIS — C539 Malignant neoplasm of cervix uteri, unspecified: Secondary | ICD-10-CM | POA: Diagnosis not present

## 2021-11-11 NOTE — Progress Notes (Signed)
NGYN presents for fibroids, vaginal bleeding, and urinary incontinence.  Mammogram 08/31/21 Colonoscopy due 01/19/25 Bone Density 06/24/21

## 2021-11-11 NOTE — Progress Notes (Addendum)
76 yo postmenopausal referred for the evaluation of fibroid uterus and postmenopausal vaginal bleeding. Patient reports onset of vaginal bleeding in September. She states the bleeding started at intermittent spotting and recently has increased in flow intermittently with passage of clots. Intercourse has become painful as well and patient has abstained since September. This is the first time she experienced postmenopausal vaginal bleeding. She reports a history of normal pap smear with her last pap smear being 10 years ago. She reports some occasional pelvic pain. She denies constipation and admits to some diarrhea. She also reports urinary incontinence which has worsened over time. Recent pelvic ultrasound revealed an 18-week size fibroid uterus  Past Medical History:  Diagnosis Date   Adjustment disorder with anxious mood 12/05/2018   Arthralgia 06/29/2015   Breast cancer ([facility]) 2010   Colon polyps    ?   Complication of anesthesia    DCIS (ductal carcinoma in situ) of breast 2010   Diverticulosis    Full dentures    Hyperlipidemia    Joint pain    Leukocytosis 07/11/2017   PONV (postoperative nausea and vomiting)    Skin cancer    Wears glasses    Past Surgical History:  Procedure Laterality Date   BREAST BIOPSY Right 05/11/2012   BREAST BIOPSY Right 04/24/2012   BREAST LUMPECTOMY Right december 2010   cancer-tamoxifen   BREAST RECONSTRUCTION WITH PLACEMENT OF TISSUE EXPANDER AND FLEX HD (ACELLULAR HYDRATED DERMIS) Right 06/20/2012   Procedure: BREAST RECONSTRUCTION WITH PLACEMENT OF TISSUE EXPANDER AND FLEX HD (ACELLULAR HYDRATED DERMIS);  Surgeon: Theodoro Kos, DO;  Location: [facility];  Service: Plastics;  Laterality: Right;   BREAST REDUCTION WITH MASTOPEXY Left 10/11/2012   Procedure: LEFT BREAST REDUCTION WITH MASTOPEXY;  Surgeon: Theodoro Kos, DO;  Location: [facility];  Service: Plastics;  Laterality: Left;   cataract sugery Bilateral 03/2019    COLONOSCOPY  08/28/2013   [facility]   DILATION AND CURETTAGE OF UTERUS     LIPOSUCTION WITH LIPOFILLING Left 10/11/2012   Procedure: LIPOSUCTION WITH LIPOFILLING;  Surgeon: Theodoro Kos, DO;  Location: [facility];  Service: Plastics;  Laterality: Left;   MASTECTOMY Right 06/20/2012   MASTECTOMY W/ SENTINEL NODE BIOPSY Right 06/20/2012   Procedure: right skin sparing MASTECTOMY WITH SENTINEL LYMPH NODE BIOPSY;  Surgeon: Stark Klein, MD;  Location: [facility];  Service: General;  Laterality: Right;   PARATHYROIDECTOMY Left 11/07/2017   Procedure: NECK EXPLORATION WITH LEFT THYROID LOBECTOMY;  Surgeon: [name], MD;  Location: [facility];  Service: General;  Laterality: Left;   POLYPECTOMY     REDUCTION MAMMAPLASTY Left 10/2012   REMOVAL OF TISSUE EXPANDER AND PLACEMENT OF IMPLANT Right 10/11/2012   Procedure: REMOVAL OF TISSUE EXPANDER AND PLACEMENT OF SILICONE IMPLANT RIGHT;  Surgeon: Theodoro Kos, DO;  Location: [facility];  Service: Plastics;  Laterality: Right;   Family History  Problem Relation Age of Onset   Alcohol abuse Mother    Colon polyps Mother    Mental illness Mother    Alcohol abuse Father        suicide   Mental illness Father    Heart disease Paternal Grandfather    Colon cancer Neg Hx    Esophageal cancer Neg Hx    Rectal cancer Neg Hx    Stomach cancer Neg Hx    Social History   Tobacco Use   Smoking status: Former    Types: Cigarettes    Quit date:  06/14/1981    Years since quitting: 40.4   Smokeless tobacco: Never  Vaping Use   Vaping Use: Never used  Substance Use Topics   Alcohol use: No   Drug use: No   ROS See pertinent in HPI. All other systems reviewed and non contributory Blood pressure 131/82, pulse 83, height 5' (1.524 m), weight 153 lb (69.4 kg).  GENERAL: Well-developed, well-nourished female in no acute distress.  ABDOMEN: Soft, nontender, palpable fibroid uterus below the umbilicus PELVIC: Normal  external female genitalia. Vagina is pink and rugated.  Blood in vaginal vault noted. Cervix could not be visualized due to the presence of a large friable mass. Chaperone present during the pelvic exam EXTREMITIES: No cyanosis, clubbing, or edema, 2+ distal pulses.  US Pelvic Complete With Transvaginal  Result Date: 09/28/2021 CLINICAL DATA:  Postmenopausal bleeding EXAM: TRANSABDOMINAL ULTRASOUND OF PELVIS TECHNIQUE: Transabdominal ultrasound examination of the pelvis was performed including evaluation of the uterus, ovaries, adnexal regions, and pelvic cul-de-sac. COMPARISON:  None Available. FINDINGS: Uterus Measurements: 18.5 x 8.4 x 11.3 cm = volume: 924 mL. There is inhomogeneous echogenicity in myometrium. There is 3.9 x 3.4 x 6.6 cm fibroid in the fundus. There are possibly other smaller fibroids in the uterus. Endometrium Thickness: 4.3 mm.  No focal abnormality visualized. Right ovary Measurements: 3.4 x 1.9 x 2 cm = volume: 6.9 mL. Normal appearance/no adnexal mass. Left ovary Measurements: 2.6 x 1.7 x 1.7 = volume: 4 mL. Normal appearance/no adnexal mass. Other findings:  No abnormal free fluid. IMPRESSION: Enlarged uterus with fibroids. Pelvic sonogram is otherwise unremarkable. Electronically Signed   By: Elmer Picker M.D.   On: 09/28/2021 14:20   MM 3D SCREEN BREAST UNI LEFT  Result Date: 08/31/2021 CLINICAL DATA:  Screening. EXAM: DIGITAL SCREENING UNILATERAL LEFT MAMMOGRAM WITH CAD AND TOMOSYNTHESIS TECHNIQUE: Left screening digital craniocaudal and mediolateral oblique mammograms were obtained. Left screening digital breast tomosynthesis was performed. The images were evaluated with computer-aided detection. COMPARISON:  Previous exam(s). ACR Breast Density Category b: There are scattered areas of fibroglandular density. FINDINGS: The patient has had a right mastectomy. There are no findings suspicious for malignancy. IMPRESSION: No mammographic evidence of malignancy. A result  letter of this screening mammogram will be mailed directly to the patient. RECOMMENDATION: Screening mammogram in one year.  (Code:SM-L-84M) BI-RADS CATEGORY  1: Negative. Electronically Signed   By: Dorise Bullion III M.D.   On: 08/31/2021 17:25     PROCEDURE: Risks of the biopsy including cramping, bleeding, infection, uterine perforation, inadequate specimen and need for additional procedures  were discussed. The patient states she understands and agrees to undergo procedure today. Consent was signed. Time out was performed.  A sterile speculum was placed in the patient's vagina. A large vaginal mass was noted distorting the normal anatomy of the cervix which was never visualized.  A  moderate amount of tissue was sent to pathology. Using a ring forceps, fragments of the cervical mass were easily removed with minimal pulling motion. Several fragments were obtained in this fashion covering different areas of the mass. Small amount of bleeding noted on the bed of this friable mass. Monsel was applied with good hemostasis noted. Specimen was sent to pathology. The instruments were removed from the patient's vagina. The patient tolerated the procedure well.  Routine post-procedure instructions were given to the patient. The patient will follow up in two weeks to review the results and for further management.    A/P 76 yo with postmenopausal vaginal bleeding  and cervical mass - Discussed different etiology for vaginal bleeding - Cervical mass biopsy was performed. Bleeding control using monsel. - Unable to perform endometrial biopsy as normal anatomy was distorted - Patient will be contacted with results - Pelvic MRI ordered to further assess involvement of mass - Will defer urogynecology referral for incontinence evaluation pending pathology results

## 2021-11-17 ENCOUNTER — Telehealth: Payer: Self-pay | Admitting: Emergency Medicine

## 2021-11-17 NOTE — Telephone Encounter (Signed)
I received a call from Dr. Chauncey Cruel a pathologist, regarding this patient's cervical biopsy that we did on 11/2. The results show an undifferentiated High Grade Malignancy. She would like to speak with a provider regarding this. Her phone number is [phone] Epic Chat sent to Dr. Rip Harbour, MD.

## 2021-11-25 ENCOUNTER — Ambulatory Visit (HOSPITAL_BASED_OUTPATIENT_CLINIC_OR_DEPARTMENT_OTHER)
Admission: RE | Admit: 2021-11-25 | Discharge: 2021-11-25 | Disposition: A | Discharge status: Home / Self Care | Payer: Medicare HMO | Source: Ambulatory Visit | Attending: Obstetrics and Gynecology | Admitting: Obstetrics and Gynecology

## 2021-11-25 DIAGNOSIS — N858 Other specified noninflammatory disorders of uterus: Secondary | ICD-10-CM | POA: Diagnosis not present

## 2021-11-25 DIAGNOSIS — N888 Other specified noninflammatory disorders of cervix uteri: Secondary | ICD-10-CM | POA: Diagnosis not present

## 2021-11-25 DIAGNOSIS — K5730 Diverticulosis of large intestine without perforation or abscess without bleeding: Secondary | ICD-10-CM | POA: Diagnosis not present

## 2021-11-25 DIAGNOSIS — R590 Localized enlarged lymph nodes: Secondary | ICD-10-CM | POA: Insufficient documentation

## 2021-11-25 DIAGNOSIS — N939 Abnormal uterine and vaginal bleeding, unspecified: Secondary | ICD-10-CM | POA: Insufficient documentation

## 2021-11-25 DIAGNOSIS — R1900 Intra-abdominal and pelvic swelling, mass and lump, unspecified site: Secondary | ICD-10-CM | POA: Insufficient documentation

## 2021-11-25 MED ORDER — GADOBUTROL 1 MMOL/ML IV SOLN
6.9000 mL | Freq: Once | INTRAVENOUS | Status: AC | PRN
  Administered 2021-11-25: 6.9 mL via INTRAVENOUS
  Filled 2021-11-25: qty 7.5

## 2021-11-26 ENCOUNTER — Encounter: Payer: Self-pay | Admitting: Hematology and Oncology

## 2021-11-26 ENCOUNTER — Telehealth: Payer: Self-pay | Admitting: *Deleted

## 2021-11-26 ENCOUNTER — Other Ambulatory Visit: Payer: Self-pay | Admitting: Emergency Medicine

## 2021-11-26 DIAGNOSIS — C549 Malignant neoplasm of corpus uteri, unspecified: Secondary | ICD-10-CM | POA: Insufficient documentation

## 2021-11-26 DIAGNOSIS — C541 Malignant neoplasm of endometrium: Secondary | ICD-10-CM

## 2021-11-26 DIAGNOSIS — C55 Malignant neoplasm of uterus, part unspecified: Secondary | ICD-10-CM | POA: Insufficient documentation

## 2021-11-26 NOTE — Progress Notes (Signed)
Referral for Gyn Onc per Dr. Elly Modena, MD.

## 2021-11-26 NOTE — Telephone Encounter (Signed)
Spoke with the patient regarding the referral to GYN oncology. Patient scheduled as new patient with Dr Berline Lopes on 11/21 at 3 pm. Patient given an arrival time of 1:30 pm. Patient to see Dr Alvy Bimler at 2 pm.   Explained to the patient the the doctor will perform a pelvic exam at this visit. Patient given the policy that no visitors under the 16 yrs are allowed in the [facility]. Patient given the address/phone number for the clinic and that the center offers free valet service.

## 2021-11-26 NOTE — Addendum Note (Signed)
Addended by: Mora Bellman on: 11/26/2021 09:32 AM   Modules accepted: Orders

## 2021-11-30 ENCOUNTER — Encounter (HOSPITAL_COMMUNITY): Payer: Self-pay

## 2021-11-30 ENCOUNTER — Telehealth: Payer: Self-pay | Admitting: *Deleted

## 2021-11-30 ENCOUNTER — Encounter: Payer: Self-pay | Admitting: Hematology and Oncology

## 2021-11-30 ENCOUNTER — Inpatient Hospital Stay: Payer: Medicare HMO | Attending: Hematology and Oncology | Admitting: Hematology and Oncology

## 2021-11-30 ENCOUNTER — Inpatient Hospital Stay (HOSPITAL_COMMUNITY)
Admission: EM | Admit: 2021-11-30 | Discharge: 2021-12-02 | DRG: 871 | Disposition: A | Discharge status: Home / Self Care | Payer: Medicare HMO | Attending: Internal Medicine | Admitting: Internal Medicine

## 2021-11-30 ENCOUNTER — Inpatient Hospital Stay: Payer: Medicare HMO

## 2021-11-30 ENCOUNTER — Other Ambulatory Visit: Payer: Self-pay

## 2021-11-30 ENCOUNTER — Inpatient Hospital Stay (HOSPITAL_BASED_OUTPATIENT_CLINIC_OR_DEPARTMENT_OTHER): Payer: Medicare HMO | Admitting: Gynecologic Oncology

## 2021-11-30 ENCOUNTER — Encounter: Payer: Self-pay | Admitting: Gynecologic Oncology

## 2021-11-30 ENCOUNTER — Emergency Department (HOSPITAL_COMMUNITY): Payer: Medicare HMO

## 2021-11-30 VITALS — BP 127/67 | HR 103 | Temp 98.1°F | Resp 18 | Ht 60.0 in | Wt 146.3 lb

## 2021-11-30 VITALS — BP 139/67 | HR 95 | Temp 98.6°F | Resp 18 | Wt 146.2 lb

## 2021-11-30 DIAGNOSIS — N950 Postmenopausal bleeding: Secondary | ICD-10-CM

## 2021-11-30 DIAGNOSIS — A419 Sepsis, unspecified organism: Secondary | ICD-10-CM

## 2021-11-30 DIAGNOSIS — Z6828 Body mass index (BMI) 28.0-28.9, adult: Secondary | ICD-10-CM

## 2021-11-30 DIAGNOSIS — K760 Fatty (change of) liver, not elsewhere classified: Secondary | ICD-10-CM | POA: Diagnosis present

## 2021-11-30 DIAGNOSIS — E861 Hypovolemia: Secondary | ICD-10-CM | POA: Diagnosis present

## 2021-11-30 DIAGNOSIS — R634 Abnormal weight loss: Secondary | ICD-10-CM | POA: Insufficient documentation

## 2021-11-30 DIAGNOSIS — R6520 Severe sepsis without septic shock: Secondary | ICD-10-CM | POA: Diagnosis not present

## 2021-11-30 DIAGNOSIS — E8809 Other disorders of plasma-protein metabolism, not elsewhere classified: Secondary | ICD-10-CM | POA: Diagnosis not present

## 2021-11-30 DIAGNOSIS — A4151 Sepsis due to Escherichia coli [E. coli]: Principal | ICD-10-CM | POA: Diagnosis present

## 2021-11-30 DIAGNOSIS — Z8249 Family history of ischemic heart disease and other diseases of the circulatory system: Secondary | ICD-10-CM

## 2021-11-30 DIAGNOSIS — N3001 Acute cystitis with hematuria: Secondary | ICD-10-CM | POA: Diagnosis not present

## 2021-11-30 DIAGNOSIS — T39395A Adverse effect of other nonsteroidal anti-inflammatory drugs [NSAID], initial encounter: Secondary | ICD-10-CM | POA: Diagnosis present

## 2021-11-30 DIAGNOSIS — Z85828 Personal history of other malignant neoplasm of skin: Secondary | ICD-10-CM

## 2021-11-30 DIAGNOSIS — Z853 Personal history of malignant neoplasm of breast: Secondary | ICD-10-CM

## 2021-11-30 DIAGNOSIS — E46 Unspecified protein-calorie malnutrition: Secondary | ICD-10-CM | POA: Diagnosis not present

## 2021-11-30 DIAGNOSIS — N858 Other specified noninflammatory disorders of uterus: Secondary | ICD-10-CM | POA: Diagnosis not present

## 2021-11-30 DIAGNOSIS — E785 Hyperlipidemia, unspecified: Secondary | ICD-10-CM | POA: Diagnosis present

## 2021-11-30 DIAGNOSIS — Z87891 Personal history of nicotine dependence: Secondary | ICD-10-CM

## 2021-11-30 DIAGNOSIS — Z8049 Family history of malignant neoplasm of other genital organs: Secondary | ICD-10-CM

## 2021-11-30 DIAGNOSIS — Z79899 Other long term (current) drug therapy: Secondary | ICD-10-CM | POA: Diagnosis not present

## 2021-11-30 DIAGNOSIS — C539 Malignant neoplasm of cervix uteri, unspecified: Secondary | ICD-10-CM | POA: Diagnosis not present

## 2021-11-30 DIAGNOSIS — C549 Malignant neoplasm of corpus uteri, unspecified: Secondary | ICD-10-CM | POA: Insufficient documentation

## 2021-11-30 DIAGNOSIS — Z9011 Acquired absence of right breast and nipple: Secondary | ICD-10-CM

## 2021-11-30 DIAGNOSIS — D631 Anemia in chronic kidney disease: Secondary | ICD-10-CM | POA: Diagnosis present

## 2021-11-30 DIAGNOSIS — D500 Iron deficiency anemia secondary to blood loss (chronic): Secondary | ICD-10-CM | POA: Diagnosis present

## 2021-11-30 DIAGNOSIS — N179 Acute kidney failure, unspecified: Secondary | ICD-10-CM | POA: Insufficient documentation

## 2021-11-30 DIAGNOSIS — C775 Secondary and unspecified malignant neoplasm of intrapelvic lymph nodes: Secondary | ICD-10-CM | POA: Diagnosis present

## 2021-11-30 DIAGNOSIS — Z8719 Personal history of other diseases of the digestive system: Secondary | ICD-10-CM

## 2021-11-30 DIAGNOSIS — D649 Anemia, unspecified: Secondary | ICD-10-CM

## 2021-11-30 DIAGNOSIS — Z91040 Latex allergy status: Secondary | ICD-10-CM

## 2021-11-30 DIAGNOSIS — R7401 Elevation of levels of liver transaminase levels: Secondary | ICD-10-CM

## 2021-11-30 DIAGNOSIS — D72829 Elevated white blood cell count, unspecified: Secondary | ICD-10-CM | POA: Diagnosis not present

## 2021-11-30 DIAGNOSIS — K5730 Diverticulosis of large intestine without perforation or abscess without bleeding: Secondary | ICD-10-CM | POA: Diagnosis not present

## 2021-11-30 DIAGNOSIS — N390 Urinary tract infection, site not specified: Secondary | ICD-10-CM | POA: Diagnosis present

## 2021-11-30 DIAGNOSIS — R590 Localized enlarged lymph nodes: Secondary | ICD-10-CM | POA: Diagnosis not present

## 2021-11-30 DIAGNOSIS — R300 Dysuria: Secondary | ICD-10-CM

## 2021-11-30 DIAGNOSIS — E782 Mixed hyperlipidemia: Secondary | ICD-10-CM | POA: Diagnosis not present

## 2021-11-30 DIAGNOSIS — N170 Acute kidney failure with tubular necrosis: Secondary | ICD-10-CM | POA: Diagnosis not present

## 2021-11-30 DIAGNOSIS — Z818 Family history of other mental and behavioral disorders: Secondary | ICD-10-CM | POA: Diagnosis not present

## 2021-11-30 DIAGNOSIS — C55 Malignant neoplasm of uterus, part unspecified: Secondary | ICD-10-CM | POA: Diagnosis not present

## 2021-11-30 DIAGNOSIS — M419 Scoliosis, unspecified: Secondary | ICD-10-CM | POA: Diagnosis not present

## 2021-11-30 DIAGNOSIS — R32 Unspecified urinary incontinence: Secondary | ICD-10-CM | POA: Insufficient documentation

## 2021-11-30 DIAGNOSIS — Z83719 Family history of colon polyps, unspecified: Secondary | ICD-10-CM | POA: Diagnosis not present

## 2021-11-30 DIAGNOSIS — N3000 Acute cystitis without hematuria: Secondary | ICD-10-CM | POA: Diagnosis present

## 2021-11-30 DIAGNOSIS — C541 Malignant neoplasm of endometrium: Secondary | ICD-10-CM | POA: Diagnosis present

## 2021-11-30 DIAGNOSIS — F411 Generalized anxiety disorder: Secondary | ICD-10-CM | POA: Diagnosis present

## 2021-11-30 DIAGNOSIS — Z886 Allergy status to analgesic agent status: Secondary | ICD-10-CM

## 2021-11-30 DIAGNOSIS — K8020 Calculus of gallbladder without cholecystitis without obstruction: Secondary | ICD-10-CM | POA: Diagnosis not present

## 2021-11-30 DIAGNOSIS — R945 Abnormal results of liver function studies: Secondary | ICD-10-CM | POA: Diagnosis not present

## 2021-11-30 LAB — CBC WITH DIFFERENTIAL/PLATELET
Abs Immature Granulocytes: 0.13 10*3/uL — ABNORMAL HIGH (ref 0.00–0.07)
Basophils Absolute: 0.1 10*3/uL (ref 0.0–0.1)
Basophils Relative: 0 %
Eosinophils Absolute: 0.1 10*3/uL (ref 0.0–0.5)
Eosinophils Relative: 0 %
HCT: 28.4 % — ABNORMAL LOW (ref 36.0–46.0)
Hemoglobin: 8.9 g/dL — ABNORMAL LOW (ref 12.0–15.0)
Immature Granulocytes: 1 %
Lymphocytes Relative: 7 %
Lymphs Abs: 1 10*3/uL (ref 0.7–4.0)
MCH: 29.1 pg (ref 26.0–34.0)
MCHC: 31.3 g/dL (ref 30.0–36.0)
MCV: 92.8 fL (ref 80.0–100.0)
Monocytes Absolute: 1.1 10*3/uL — ABNORMAL HIGH (ref 0.1–1.0)
Monocytes Relative: 8 %
Neutro Abs: 12.2 10*3/uL — ABNORMAL HIGH (ref 1.7–7.7)
Neutrophils Relative %: 84 %
Platelets: 350 10*3/uL (ref 150–400)
RBC: 3.06 MIL/uL — ABNORMAL LOW (ref 3.87–5.11)
RDW: 15.6 % — ABNORMAL HIGH (ref 11.5–15.5)
WBC: 14.5 10*3/uL — ABNORMAL HIGH (ref 4.0–10.5)
nRBC: 0 % (ref 0.0–0.2)

## 2021-11-30 LAB — CBC WITH DIFFERENTIAL (CANCER CENTER ONLY)
Abs Immature Granulocytes: 0.09 10*3/uL — ABNORMAL HIGH (ref 0.00–0.07)
Basophils Absolute: 0.1 10*3/uL (ref 0.0–0.1)
Basophils Relative: 0 %
Eosinophils Absolute: 0.1 10*3/uL (ref 0.0–0.5)
Eosinophils Relative: 0 %
HCT: 29.5 % — ABNORMAL LOW (ref 36.0–46.0)
Hemoglobin: 9.6 g/dL — ABNORMAL LOW (ref 12.0–15.0)
Immature Granulocytes: 1 %
Lymphocytes Relative: 8 %
Lymphs Abs: 1.4 10*3/uL (ref 0.7–4.0)
MCH: 29.4 pg (ref 26.0–34.0)
MCHC: 32.5 g/dL (ref 30.0–36.0)
MCV: 90.5 fL (ref 80.0–100.0)
Monocytes Absolute: 0.9 10*3/uL (ref 0.1–1.0)
Monocytes Relative: 6 %
Neutro Abs: 14.1 10*3/uL — ABNORMAL HIGH (ref 1.7–7.7)
Neutrophils Relative %: 85 %
Platelet Count: 395 10*3/uL (ref 150–400)
RBC: 3.26 MIL/uL — ABNORMAL LOW (ref 3.87–5.11)
RDW: 15.5 % (ref 11.5–15.5)
WBC Count: 16.6 10*3/uL — ABNORMAL HIGH (ref 4.0–10.5)
nRBC: 0 % (ref 0.0–0.2)

## 2021-11-30 LAB — COMPREHENSIVE METABOLIC PANEL
ALT: 69 U/L — ABNORMAL HIGH (ref 0–44)
ALT: 69 U/L — ABNORMAL HIGH (ref 0–44)
AST: 74 U/L — ABNORMAL HIGH (ref 15–41)
AST: 72 U/L — ABNORMAL HIGH (ref 15–41)
Albumin: 3.2 g/dL — ABNORMAL LOW (ref 3.5–5.0)
Albumin: 2.4 g/dL — ABNORMAL LOW (ref 3.5–5.0)
Alkaline Phosphatase: 142 U/L — ABNORMAL HIGH (ref 38–126)
Alkaline Phosphatase: 129 U/L — ABNORMAL HIGH (ref 38–126)
Anion gap: 7 (ref 5–15)
Anion gap: 6 (ref 5–15)
BUN: 45 mg/dL — ABNORMAL HIGH (ref 8–23)
BUN: 45 mg/dL — ABNORMAL HIGH (ref 8–23)
CO2: 27 mmol/L (ref 22–32)
CO2: 26 mmol/L (ref 22–32)
Calcium: 9.4 mg/dL (ref 8.9–10.3)
Calcium: 8.7 mg/dL — ABNORMAL LOW (ref 8.9–10.3)
Chloride: 104 mmol/L (ref 98–111)
Chloride: 107 mmol/L (ref 98–111)
Creatinine, Ser: 3.18 mg/dL (ref 0.44–1.00)
Creatinine, Ser: 3.4 mg/dL — ABNORMAL HIGH (ref 0.44–1.00)
GFR, Estimated: 15 mL/min — ABNORMAL LOW (ref >60)
GFR, Estimated: 14 mL/min — ABNORMAL LOW (ref >60)
Glucose, Bld: 150 mg/dL — ABNORMAL HIGH (ref 70–99)
Glucose, Bld: 123 mg/dL — ABNORMAL HIGH (ref 70–99)
Potassium: 4.6 mmol/L (ref 3.5–5.1)
Potassium: 4 mmol/L (ref 3.5–5.1)
Sodium: 138 mmol/L (ref 135–145)
Sodium: 139 mmol/L (ref 135–145)
Total Bilirubin: 0.4 mg/dL (ref 0.3–1.2)
Total Bilirubin: 0.7 mg/dL (ref 0.3–1.2)
Total Protein: 7.5 g/dL (ref 6.5–8.1)
Total Protein: 7.2 g/dL (ref 6.5–8.1)

## 2021-11-30 LAB — URINALYSIS, ROUTINE W REFLEX MICROSCOPIC
Bilirubin Urine: NEGATIVE
Glucose, UA: NEGATIVE mg/dL
Ketones, ur: NEGATIVE mg/dL
Nitrite: NEGATIVE
Protein, ur: 100 mg/dL — AB
Specific Gravity, Urine: 1.016 (ref 1.005–1.030)
WBC, UA: >50 WBC/hpf — ABNORMAL HIGH (ref 0–5)
pH: 5 (ref 5.0–8.0)

## 2021-11-30 LAB — PROTIME-INR
INR: 1.3 — ABNORMAL HIGH (ref 0.8–1.2)
Prothrombin Time: 15.6 seconds — ABNORMAL HIGH (ref 11.4–15.2)

## 2021-11-30 LAB — RETICULOCYTES
Immature Retic Fract: 16.2 % — ABNORMAL HIGH (ref 2.3–15.9)
RBC.: 2.9 MIL/uL — ABNORMAL LOW (ref 3.87–5.11)
Retic Count, Absolute: 23.5 10*3/uL (ref 19.0–186.0)
Retic Ct Pct: 0.8 % (ref 0.4–3.1)

## 2021-11-30 LAB — LACTIC ACID, PLASMA
Lactic Acid, Venous: 1.4 mmol/L (ref 0.5–1.9)
Lactic Acid, Venous: 1.4 mmol/L (ref 0.5–1.9)

## 2021-11-30 LAB — MAGNESIUM: Magnesium: 1.9 mg/dL (ref 1.7–2.4)

## 2021-11-30 LAB — CK: Total CK: 57 U/L (ref 38–234)

## 2021-11-30 LAB — ACETAMINOPHEN LEVEL: Acetaminophen (Tylenol), Serum: <10 ug/mL — ABNORMAL LOW (ref 10–30)

## 2021-11-30 LAB — ABO/RH: ABO/RH(D): O POS

## 2021-11-30 MED ORDER — SODIUM CHLORIDE 0.9 % IV SOLN
2.0000 g | Freq: Once | INTRAVENOUS | Status: AC
  Administered 2021-11-30: 2 g via INTRAVENOUS
  Filled 2021-11-30: qty 20

## 2021-11-30 MED ORDER — MELATONIN 3 MG PO TABS: 3.0000 mg | ORAL_TABLET | Freq: Every evening | ORAL | Status: DC | PRN

## 2021-11-30 MED ORDER — LACTATED RINGERS IV SOLN: INTRAVENOUS | Status: DC

## 2021-11-30 MED ORDER — LACTATED RINGERS IV BOLUS
1000.0000 mL | Freq: Once | INTRAVENOUS | Status: AC
  Administered 2021-11-30: 1000 mL via INTRAVENOUS

## 2021-11-30 MED ORDER — SODIUM CHLORIDE 0.9 % IV SOLN
1.0000 g | INTRAVENOUS | Status: DC
  Administered 2021-12-01: 1 g via INTRAVENOUS
  Filled 2021-11-30: qty 10

## 2021-11-30 MED ORDER — LORAZEPAM 2 MG/ML IJ SOLN
INTRAMUSCULAR | Status: AC
  Filled 2021-11-30: qty 1

## 2021-11-30 MED ORDER — LORAZEPAM 2 MG/ML IJ SOLN
0.5000 mg | Freq: Four times a day (QID) | INTRAMUSCULAR | Status: DC | PRN
  Administered 2021-11-30: 0.5 mg via INTRAVENOUS

## 2021-11-30 MED ORDER — ESCITALOPRAM OXALATE 10 MG PO TABS
10.0000 mg | ORAL_TABLET | Freq: Every day | ORAL | Status: DC
  Administered 2021-12-01: 10 mg via ORAL
  Filled 2021-11-30 (×2): qty 1

## 2021-11-30 MED ORDER — ESCITALOPRAM OXALATE 10 MG PO TABS: 10.0000 mg | ORAL_TABLET | Freq: Every day | ORAL | Status: DC

## 2021-11-30 MED ORDER — MELATONIN 3 MG PO TABS
3.0000 mg | ORAL_TABLET | Freq: Once | ORAL | Status: AC
  Administered 2021-11-30: 3 mg via ORAL
  Filled 2021-11-30: qty 1

## 2021-11-30 NOTE — Assessment & Plan Note (Signed)
At the time of dictation, her renal function came back abnormal This is grossly abnormal compared to her baseline With signs of acute renal failure, my concern is the patient might have compression from her tumor I will not be able to treat her until renal failure resolved I recommend the patient to go to the emergency department for further evaluation and urgent admission She could benefit from imaging study for further evaluation, urology consult if needed and IV fluid resuscitation

## 2021-11-30 NOTE — Assessment & Plan Note (Signed)
We discussed importance of frequent small meals and high-protein intake

## 2021-11-30 NOTE — ED Triage Notes (Signed)
Pt here from [facility] for elevated creatinine level

## 2021-11-30 NOTE — Progress Notes (Signed)
Called [facility] pathology to add per Dr. Alvy Bimler, Her2/neu and dmmr/msi and PD-L1 testing (in that order of preference if we have insufficient tissue). They have enough tissue per Casimer Bilis and will add.

## 2021-11-30 NOTE — Patient Instructions (Signed)
It was very nice to meet you today.  I will call you once I have results from your biopsy back as well as your CT scan.  As we discussed today, the results from this testing will help [place] decide on treatment strategy.  We may be proceeding with chemotherapy first to shrink the cancer before surgery.  Otherwise, we will discuss getting you scheduled for surgery.

## 2021-11-30 NOTE — Telephone Encounter (Signed)
CRITICAL VALUE STICKER  CRITICAL VALUE: Crt 3.18  RECEIVER (on-site recipient of call): Shelle Iron, RN  [place] NOTIFIED: 11/30/2021 @ 3:48 pm  MESSENGER (representative from lab): Heather  MD NOTIFIED: Dr Alvy Bimler and her nurse  TIME OF NOTIFICATION: 3:50pm  RESPONSE:  made aware, lab value is abnormal for patient.

## 2021-11-30 NOTE — ED Notes (Signed)
Patient transported to CT 

## 2021-11-30 NOTE — H&P (Signed)
History and Physical      Kayla Mcgrath DVV:[id number] DOB: 1945-05-09 DOA: 11/30/2021  PCP: Lafonda Mosses, MD  Patient coming from: home   I have personally briefly reviewed patient's old medical records in [place]  Chief Complaint: lab abnormality   HPI: Kayla Mcgrath is a 76 y.o. female with medical history significant for recently diagnosed endometrial carcinoma, generalized anxiety disorder, hyperlipidemia, who is admitted to [facility] on 11/30/2021 with acute kidney injury after presenting from home to [facility] ED for evaluation of outpatient laboratory abnormality noting increased creatinine.  The patient was recently diagnosed with endometrial carcinoma, following with Dr. Berline Lopes As her outpatient Gyn-Onc physician.  In preparation for plan for CT chest and CT abdomen with contrast for staging purposes, along with plan for placement of Port-A-Cath for anticipated ensuing initiation of chemotherapy, Dr. Berline Lopes obtained routine labs as an outpatient, which were notable for elevated creatinine of 3.40 compared to most recent prior serum creatinine of 0.78 in January 2023.  In light of his results, the patient was instructed to present to the emergency department for further evaluation management of her diminished renal function.  She notes that she has been experiencing increased urinary frequency over the course of the last week, and also noting urinary incontinence with sleep, which is also new for her.  Denies any overt dysuria or gross hematuria.  Denies any associated subjective fever, chills, rigors, or generalized myalgias.  No new flank tenderness.  She also conveys recent decline in intake due to diminished appetite.  Denies any significant nausea and no recent vomiting.  No recent diarrhea, melena, or hematochezia.  No recent chest pain, shortness of breath, palpitations, diaphoresis.  Denies any regular or recent alcohol consumption or recreational drug use.   No recent shortness of breath or cough.   Per chart review, most recent prior set of liver enzymes were checked in November 2020, and were notable at that time for the following: Alkaline phosphatase 73, AST 24, ALT 21, total bilirubin 0.5.  Additionally, per chart review, most recent prior hemoglobin was noted to be 11.2 on 10/22/2021, which was preceded by a nearly 2-year gap in hemoglobin data points dating back to November 2021, at which time hemoglobin was found to be 14.4.  Not on any blood thinners as an outpatient.     ED Course:  Vital signs in the ED were notable for the following: Afebrile; heart rate 62-69; systolic blood pressures in the 110s to 120s; respiratory rate 17-18, oxygen saturation 98 to 99% on room air.  Labs were notable for the following: CMP notable for the following: Sodium 139, potassium 4.0, bicarbonate 26, anion gap 6, creatinine 3.40, calcium, adjusted for mild hypoalbuminemia noted to be 9.9, avidin 2.4, alkaline phosphatase 129, AST 72, ALT 69, total bilirubin 0.7.  CBC notable for white cell count 14,500 with 85% neutrophils, hemoglobin 8.9 associated normocytic/normochromic properties, but the count 350.  Urinalysis demonstrates greater than 50 white blood cells, moderate leukocyte esterase, no squamous epithelial cells, 100 protein, and 11-20 RBCs.  Urine culture collected prior to initiation of IV antibiotics.  Imaging and additional notable ED work-up: CT abdomen/pelvis without contrast: Per radiology read, demonstrated pathologic retroperitoneal and bilateral pelvic adenopathy compatible with metastatic disease; large central uterine mass, consistent with malignancy; no evidence of ureteral stone or any evidence of hydronephrosis or hydroureter; colonic diverticulosis in the absence of evidence of diverticulitis.  Gallbladder sludge versus small gallstones, in the absence of gallbladder  distention, gallbladder wall thickening or pericholecystic fluid;, common bile  duct nondilated and no overt choledocholithiasis or any evidence of focal liver abnormality.  EDP contacted the on-call Gyn-Onc, Dr. Lahoma Crocker, Updating as to the above and notifying plan for admission to the hospitalist service at [facility].  While in the ED, the following were administered: Lactated Ringer's x1 L bolus, Rocephin.  Subsequently, the patient was admitted for further evaluation management of acute kidney injury, with presentation also notable for severe sepsis due to acute cystitis, and laboratory findings notable for transaminitis as well as subacute anemia.     Review of Systems: As per HPI otherwise 10 point review of systems negative.   Past Medical History:  Diagnosis Date   Adjustment disorder with anxious mood 12/05/2018   Arthralgia 06/29/2015   Breast cancer ([employer]) 2010   Colon polyps    ?   Complication of anesthesia    DCIS (ductal carcinoma in situ) of breast 2010   Diverticulosis    Full dentures    Hyperlipidemia    Joint pain    Leukocytosis 07/11/2017   PONV (postoperative nausea and vomiting)    Skin cancer    SCC   Wears glasses     Past Surgical History:  Procedure Laterality Date   BREAST BIOPSY Right 05/11/2012   BREAST BIOPSY Right 04/24/2012   BREAST LUMPECTOMY Right 12/2008   cancer-tamoxifen   BREAST RECONSTRUCTION WITH PLACEMENT OF TISSUE EXPANDER AND FLEX HD (ACELLULAR HYDRATED DERMIS) Right 06/20/2012   Procedure: BREAST RECONSTRUCTION WITH PLACEMENT OF TISSUE EXPANDER AND FLEX HD (ACELLULAR HYDRATED DERMIS);  Surgeon: Theodoro Kos, DO;  Location: [place];  Service: Plastics;  Laterality: Right;   BREAST REDUCTION WITH MASTOPEXY Left 10/11/2012   Procedure: LEFT BREAST REDUCTION WITH MASTOPEXY;  Surgeon: Theodoro Kos, DO;  Location: [place];  Service: Plastics;  Laterality: Left;   cataract sugery Bilateral 03/2019   COLONOSCOPY  08/28/2013   [place]   DILATION AND CURETTAGE OF UTERUS      heavy bleeding between two pregnancies   LIPOSUCTION WITH LIPOFILLING Left 10/11/2012   Procedure: LIPOSUCTION WITH LIPOFILLING;  Surgeon: Theodoro Kos, DO;  Location: [place];  Service: Plastics;  Laterality: Left;   MASTECTOMY Right 06/20/2012   MASTECTOMY W/ SENTINEL NODE BIOPSY Right 06/20/2012   Procedure: right skin sparing MASTECTOMY WITH SENTINEL LYMPH NODE BIOPSY;  Surgeon: Stark Klein, MD;  Location: [place];  Service: General;  Laterality: Right;   PARATHYROIDECTOMY Left 11/07/2017   Procedure: NECK EXPLORATION WITH LEFT THYROID LOBECTOMY;  Surgeon: [name], MD;  Location: [facility];  Service: General;  Laterality: Left;   POLYPECTOMY     REDUCTION MAMMAPLASTY Left 10/2012   REMOVAL OF TISSUE EXPANDER AND PLACEMENT OF IMPLANT Right 10/11/2012   Procedure: REMOVAL OF TISSUE EXPANDER AND PLACEMENT OF SILICONE IMPLANT RIGHT;  Surgeon: Theodoro Kos, DO;  Location: [place];  Service: Plastics;  Laterality: Right;    Social History:  reports that she quit smoking about 40 years ago. Her smoking use included cigarettes. She has never used smokeless tobacco. She reports that she does not drink alcohol and does not use drugs.   Allergies  Allergen Reactions   Tylenol [Acetaminophen] Hives    Lip swelling   Latex Rash    Family History  Problem Relation Age of Onset   Alcohol abuse Mother    Colon polyps Mother    Mental illness Mother  Alcohol abuse Father        suicide   Mental illness Father    Cancer Sister        gyn cancer   Heart disease Paternal Grandfather    Colon cancer Neg Hx    Esophageal cancer Neg Hx    Rectal cancer Neg Hx    Stomach cancer Neg Hx    Breast cancer Neg Hx    Ovarian cancer Neg Hx    Endometrial cancer Neg Hx    Pancreatic cancer Neg Hx    Prostate cancer Neg Hx     Family history reviewed and not pertinent    Prior to Admission medications   Medication Sig Start Date End  Date Taking? Authorizing Provider  Cholecalciferol (VITAMIN D) 2000 UNITS CAPS Take one by mouth daily    [provider]  escitalopram (LEXAPRO) 10 MG tablet Take 1 tablet (10 mg total) by mouth daily. 01/15/21   Nche, Charlene Brooke, NP  Famotidine (PEPCID PO) Take by mouth.    [provider]  magnesium 30 MG tablet Take 30 mg by mouth daily.    [provider]  OVER THE COUNTER MEDICATION CBD Cream-Apply to knees at bedtime    [provider]  rosuvastatin (CRESTOR) 20 MG tablet CRESTOR 20MG 1X/WEEK X 1WEEK, THEN 2X/WEEK X 1WEEK, THEN 3X/WEEK CONTINUOUSLY. 10/13/21   Nche, Charlene Brooke, NP  Turmeric 400 MG CAPS Take 300 mg by mouth daily.    [provider]  vitamin B-12 (CYANOCOBALAMIN) 1000 MCG tablet Take 1 tablet by mouth daily at 2 PM. 10/19/20   [provider]     Objective    Physical Exam: Vitals:   11/30/21 1654 11/30/21 1700 11/30/21 1908  BP: (!) 123/53 (!) 118/56 117/65  Pulse: 91 91 92  Resp: _0 Temp: 98.6 F (37 C)    TempSrc: Oral    SpO2: 98% 99% 98%    General: appears to be stated age; alert, oriented Skin: warm, dry, no rash Head:  AT/[place] Mouth:  Oral mucosa membranes appear dry, normal dentition Neck: supple; trachea midline Heart:  RRR; did not appreciate any M/R/G Lungs: CTAB, did not appreciate any wheezes, rales, or rhonchi Abdomen: + BS; soft, ND, NT Vascular: 2+ pedal pulses b/l; 2+ radial pulses b/l Extremities: no peripheral edema, no muscle wasting Neuro: strength and sensation intact in upper and lower extremities b/l     Labs on Admission: I have personally reviewed following labs and imaging studies  CBC: Recent Labs  Lab 11/30/21 1500 11/30/21 1727  WBC 16.6* 14.5*  NEUTROABS 14.1* 12.2*  HGB 9.6* 8.9*  HCT 29.5* 28.4*  MCV 90.5 92.8  PLT 395 093   Basic Metabolic Panel: Recent Labs  Lab 11/30/21 1500 11/30/21 1727  NA 138 139  K 4.6 4.0  CL 104 107  CO2 27 26   GLUCOSE 150* 123*  BUN 45* 45*  CREATININE 3.18* 3.40*  CALCIUM 9.4 8.7*   GFR: Estimated Creatinine Clearance: 12.3 mL/min (A) (by C-G formula based on SCr of 3.4 mg/dL (H)). Liver Function Tests: Recent Labs  Lab 11/30/21 1500 11/30/21 1727  AST 74* 72*  ALT 69* 69*  ALKPHOS 142* 129*  BILITOT 0.4 0.7  PROT 7.5 7.2  ALBUMIN 3.2* 2.4*   No results for input(s): "LIPASE", "AMYLASE" in the last 168 hours. No results for input(s): "AMMONIA" in the last 168 hours. Coagulation Profile: No results for input(s): "INR", "PROTIME" in the last  168 hours. Cardiac Enzymes: No results for input(s): "CKTOTAL", "CKMB", "CKMBINDEX", "TROPONINI" in the last 168 hours. BNP (last 3 results) No results for input(s): "PROBNP" in the last 8760 hours. HbA1C: No results for input(s): "HGBA1C" in the last 72 hours. CBG: No results for input(s): "GLUCAP" in the last 168 hours. Lipid Profile: No results for input(s): "CHOL", "HDL", "LDLCALC", "TRIG", "CHOLHDL", "LDLDIRECT" in the last 72 hours. Thyroid Function Tests: No results for input(s): "TSH", "T4TOTAL", "FREET4", "T3FREE", "THYROIDAB" in the last 72 hours. Anemia Panel: No results for input(s): "VITAMINB12", "FOLATE", "FERRITIN", "TIBC", "IRON", "RETICCTPCT" in the last 72 hours. Urine analysis:    Component Value Date/Time   COLORURINE STRAW (A) 11/30/2021 1627   APPEARANCEUR TURBID (A) 11/30/2021 1627   LABSPEC 1.016 11/30/2021 1627   PHURINE 5.0 11/30/2021 1627   GLUCOSEU NEGATIVE 11/30/2021 1627   HGBUR LARGE (A) 11/30/2021 1627   BILIRUBINUR NEGATIVE 11/30/2021 1627   BILIRUBINUR 1+ 07/19/2021 0917   KETONESUR NEGATIVE 11/30/2021 1627   PROTEINUR 100 (A) 11/30/2021 1627   UROBILINOGEN 0.2 07/19/2021 0917   NITRITE NEGATIVE 11/30/2021 1627   LEUKOCYTESUR MODERATE (A) 11/30/2021 1627    Radiological Exams on Admission: CT Renal Stone Study  Result Date: 11/30/2021 CLINICAL DATA:  Uterine carcinosarcoma.  Abdominal pain. *  Tracking Code: BO * EXAM: CT ABDOMEN AND PELVIS WITHOUT CONTRAST TECHNIQUE: Multidetector CT imaging of the abdomen and pelvis was performed following the standard protocol without IV contrast. RADIATION DOSE REDUCTION: This exam was performed according to the departmental dose-optimization program which includes automated exposure control, adjustment of the mA and/or kV according to patient size and/or use of iterative reconstruction technique. COMPARISON:  Pelvic MRI 11/25/2021 FINDINGS: Lower chest: Linear subsegmental atelectasis or scarring in the posterior basal segment right lower lobe. Lower margin of a right breast implant is visible. Hepatobiliary: Faint dependent density in the gallbladder could represent sludge or small gallstones. Noncontrast CT appearance of the liver otherwise unremarkable. Pancreas: Unremarkable Spleen: Unremarkable Adrenals/Urinary Tract: The adrenal glands appear unremarkable. Currently there is no hydronephrosis and no hydroureter. Urinary bladder unremarkable. No urinary tract calculus observed. Stomach/Bowel: Descending and sigmoid colon diverticulosis without active diverticulitis. Scattered diverticula in the remainder of the colon including the transverse colon. Normal appendix. No dilated bowel. Vascular/Lymphatic: Mild abdominal aortic atherosclerotic vascular calcification. Pathologic retroperitoneal and bilateral pelvic adenopathy. Index left periaortic node 2.3 cm in short axis on image 30 series 2. Index right common iliac node 1.6 cm in short axis on image 48 series 2. Index right external iliac node 2.2 cm in short axis on image 66 series 2. Index left external iliac node 3.0 cm in short axis on image 59 series 2. Substantial superior rectal and perirectal adenopathy noted with a left perirectal lymph node measuring 1.8 cm in short axis on image 66 series 2. Reproductive: Large central uterine mass better depicted on the recent MRI, but measuring about 5.6 cm  anterior-posterior on image 110 series 6. Miniscule locules of gas along this mass, possibly biopsy related. Other: No ascites observed. Musculoskeletal: Mild dextroconvex lumbar scoliosis with rotary component. IMPRESSION: 1. Pathologic retroperitoneal and bilateral pelvic adenopathy compatible with metastatic disease. 2. Large central uterine mass better depicted on recent MRI, compatible with malignancy. 3. No current hydronephrosis or hydroureter. 4. Colonic diverticulosis without active diverticulitis. 5. Faint dependent density in the gallbladder could represent sludge or small gallstones. 6. Mild dextroconvex lumbar scoliosis with rotary component. 7. Aortic atherosclerosis. Aortic Atherosclerosis (ICD10-I70.0). Electronically Signed   By: Van Clines  M.D.   On: 11/30/2021 19:05      Assessment/Plan    Principal Problem:   AKI (acute kidney injury) ([place]) Active Problems:   HLD (hyperlipidemia)   Severe sepsis (HCC)   Acute cystitis   Transaminitis   Normocytic anemia   Endometrial carcinoma (HCC)   GAD (generalized anxiety disorder)      #) Acute Kidney Injury:  as quantified above.  Potentially multifactorial in nature, with potential prerenal contribution from evidence of mild dehydration, consistent with the patient's report of recent decline in oral intake.  Differential also includes the possibility of postrenal influences, including increased risk for bladder outlet obstruction given the presence of a large central uterine mass in the context of recently diagnosed endometrial carcinoma.  The latter would be consistent with the patient's report of recent development of urinary incontinence due to overflow in the setting of incomplete emptying of the bladder.  Will further evaluate with postvoid residual bladder scans, as outlined below.  Of note, her AKI is not associate any hyperkalemia or any evidence of anion gap metabolic acidosis.  No evidence of acute volume overload at  this time, rather she does appear to be mildly dehydrated, as above.  Differential also includes potential additional prerenal influence from presenting severe sepsis due to UTI.    Plan: monitor strict I's & O's and daily weights. Attempt to avoid nephrotoxic agents. Refrain from NSAIDs. Repeat CMP in the morning. Check serum magnesium level. Add-on random urine sodium and random urine creatinine.  Add on CPK level.  Lactated Ringer's at 100 cc/h.  Every 6 hours for postvoid residual scans, with as needed straight cath for PVR greater than 500, x3 occurrences.             #) Severe sepsis due to  acute cystitis: New onset urinary frequency over the course the last week, with presenting urinalysis demonstrating significant pyuria and moderate leukocyte esterase, no evidence of [place] cells to suggest a contaminated specimen.   SIRS criteria met via leukocytosis, mild tachycardia.  Stat lactic acid level: Has been ordered, with result currently pending. Of note, given the associated presence of suspected end organ damage in the form of concominant presenting acute kidney injury, criteria are met for pt's sepsis to be considered severe in nature. However, in the absence of lactic acid level that is greater than or equal to 4.0, and in the absence of any associated hypotension refractory to IVF's, there are no indications for administration of a 30 mL/kg IVF bolus at this time.   Additional ED work-up/management notable for: Urine culture collected prior to initiation of Rocephin, which will be continued for acute cystitis.  Of note, blood cultures were not collected prior to initiation of IV antibiotics.   Plan: CBC w/ diff and CMP in AM.  Follow for results of urine culture. Abx: Continue Rocephin.  Stat lactic acid, as above.  Continuous lactated Ringer's.          #) Transaminitis: Mild elevation in transaminases, with chronicity not entirely clear given a nearly 3-year gap in  liver enzyme data points, as further quantified above.  Appears more hepatocellular in distribution, without any elevation in total bilirubin, and with minimal elevation of alk phos, for which we will check GGT to further delineate source, particular in the context of metastatic endometrial carcinoma.  Differential includes potential metastatic disease to the liver from her endometrial carcinoma, with Gyn-Onc plan to pursue CT abdomen/pelvis with contrast is completed further staging, as  further detailed above.  Her transaminitis may also be reactive in nature, given the retroperitoneal and bilateral pelvic adenopathy identified on today CT abdomen/pelvis in the setting of metastatic disease.  Denies any recent or recurrent use of alcohol or use of recreational drugs.  CT abdomen/pelvis today without contrast showed no evidence to suggest acute cholecystitis nor any evidence of biliary obstruction.   Plan: Add on acetaminophen level.  Check INR.  Repeat CMP in the morning.  Check GGT.  Hold home rosuvastatin for now.  Plan ultimately includes pursuit if CT abdomen/pelvis with contrast after renal function will improve for further staging in the setting of her endometrial carcinoma including for evaluation of metastatic disease to the liver.           #) Subacute anemia: Relative to hemoglobin of 14.14 November 2019, hemoglobin appears to be gradually trending down since October 2023, as further quantified above with present hemoglobin 8.9 associated Neuraceq/normochromic properties.  No overt evidence of blood loss.  Differential includes decline in hemoglobin as a consequence of acute kidney injury, as described above as well as influence from recent identified endometrial carcinoma, with potential metastatic disease to liver.  Will further spine evaluation as described below.  Not on any blood thinners as an outpatient.  Plan: Add on the following laboratory studies: Iron studies, S06, folic acid  level, reticulocyte count.  Check INR.  Type and screen ordered.  Repeat CBC in the morning.          #) Generalized anxiety disorder: On Lexapro as an outpatient.  The patient confirms that she has not yet had today's dose of Lexapro.  Plan: Resume home Lexapro, first dose now.             #) Hyperlipidemia: documented h/o such. On rosuvastatin as outpatient.   Plan: In the setting of presenting transaminitis, will hold home rosuvastatin for now.           #(Endometrial carcinoma: Recently diagnosed, with patient working closely with her outpatient Gyn-Onc physician  (Dr. Berline Lopes) with plan for pursuit of CT chest and CT abdomen/pelvis with contrast for further staging, as further detailed above.  Warrants further evaluation management of AKI prior to pursuing these contrast based studies, prompting hospitalization for acute kidney injury today, as further detailed above.  She has not yet started chemotherapy. Gyn-Onc arranging for Port-A-Cath placement, as outpatient.  Today, EDP notified on-call Gyn-Onc, of the above updates and plan for today's admission to the hospitalist service, as further detailed above.  Plan: Further evaluation management of acute injury, as above.  On-call**notified this evening's hospitalization, as further detailed above.       DVT prophylaxis: SCD's   Code Status: Full code Family Communication: none Disposition Plan: Per Rounding Team Consults called: EDP notified on-call Gyn-Onc (Dr. Lendell Caprice) of the plan for admission to the hospitalist today;  Admission status: Inpatient    I SPENT GREATER THAN 75  MINUTES IN CLINICAL CARE TIME/MEDICAL DECISION-MAKING IN COMPLETING [place].     [place] DO [facility] [place]   11/30/2021, 8:11 PM

## 2021-11-30 NOTE — ED Provider Notes (Signed)
[facility] DEPT Provider Note  CSN: [id number] Arrival date & time: 11/30/21 1623  Chief Complaint(s) abnormal labs  HPI Kayla Mcgrath is a 76 y.o. female With PMH uterine carcinoma recently diagnosed and following with Dr. Berline Lopes of gynecologic oncology, HLD who presents emergency department for evaluation of abnormal labs.  During patient's initial laboratory evaluation for her newly diagnosed uterine cancer, she was found to have a new significant azotemia with creatinine greater than 3 with normal labs only 10 months ago.  Patient states that she is still currently able to urinate and denies chest pain, shortness of breath, abdominal pain, dysuria, headache, fever or other systemic symptoms.  Patient did have a pelvic MRI done last week but it did not visualize the kidneys.   Past Medical History Past Medical History:  Diagnosis Date   Adjustment disorder with anxious mood 12/05/2018   Arthralgia 06/29/2015   Breast cancer ([place]) 2010   Colon polyps    ?   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 Authorizing Provider  Cholecalciferol (VITAMIN D) 2000 UNITS CAPS Take one by mouth daily    [provider]  escitalopram (LEXAPRO) 10 MG tablet Take 1 tablet (10 mg total) by mouth daily. 01/15/21   Nche, Charlene Brooke, NP  Famotidine (PEPCID PO) Take by mouth.    [provider]  magnesium 30 MG tablet Take 30 mg by mouth daily.    [provider]  OVER THE COUNTER MEDICATION CBD Cream-Apply to knees at bedtime    [provider]  rosuvastatin (CRESTOR) 20 MG tablet CRESTOR '20MG'$  1X/WEEK X 1WEEK, THEN 2X/WEEK X 1WEEK, THEN 3X/WEEK CONTINUOUSLY. 10/13/21   Nche, Charlene Brooke, NP  Turmeric 400 MG CAPS Take 300 mg by mouth daily.    [provider]  vitamin B-12 (CYANOCOBALAMIN) 1000 MCG tablet Take 1 tablet by mouth daily at 2 PM. 10/19/20   [provider]  Past Surgical History Past Surgical History:  Procedure Laterality Date   BREAST BIOPSY Right 05/11/2012   BREAST BIOPSY Right 04/24/2012   BREAST LUMPECTOMY Right 12/2008   cancer-tamoxifen   BREAST RECONSTRUCTION WITH PLACEMENT OF TISSUE EXPANDER AND FLEX HD (ACELLULAR HYDRATED DERMIS) Right 06/20/2012   Procedure: BREAST RECONSTRUCTION WITH PLACEMENT OF TISSUE EXPANDER AND FLEX HD (ACELLULAR HYDRATED DERMIS);  Surgeon: Theodoro Kos, DO;  Location: [facility];  Service: Plastics;  Laterality: Right;   BREAST REDUCTION WITH MASTOPEXY Left 10/11/2012   Procedure: LEFT BREAST REDUCTION WITH MASTOPEXY;  Surgeon: Theodoro Kos, DO;  Location: [facility];  Service: Plastics;  Laterality: Left;   cataract sugery Bilateral 03/2019   COLONOSCOPY  08/28/2013   [facility]   DILATION AND CURETTAGE OF UTERUS     heavy bleeding between two pregnancies    LIPOSUCTION WITH LIPOFILLING Left 10/11/2012   Procedure: LIPOSUCTION WITH LIPOFILLING;  Surgeon: Theodoro Kos, DO;  Location: [facility];  Service: Plastics;  Laterality: Left;   MASTECTOMY Right 06/20/2012   MASTECTOMY W/ SENTINEL NODE BIOPSY Right 06/20/2012   Procedure: right skin sparing MASTECTOMY WITH SENTINEL LYMPH NODE BIOPSY;  Surgeon: Stark Klein, MD;  Location: [facility];  Service: General;  Laterality: Right;   PARATHYROIDECTOMY Left 11/07/2017   Procedure: NECK EXPLORATION WITH LEFT THYROID LOBECTOMY;  Surgeon: [name], MD;  Location: [facility];  Service: General;  Laterality: Left;   POLYPECTOMY     REDUCTION MAMMAPLASTY Left 10/2012   REMOVAL OF TISSUE EXPANDER AND PLACEMENT OF IMPLANT Right 10/11/2012   Procedure: REMOVAL OF TISSUE EXPANDER AND PLACEMENT OF SILICONE IMPLANT RIGHT;  Surgeon: Theodoro Kos, DO;  Location: [facility];  Service: Plastics;  Laterality: Right;   Family History Family History  Problem Relation Age of Onset   Alcohol abuse Mother    Colon polyps Mother    Mental illness Mother    Alcohol abuse Father        suicide   Mental illness Father    Cancer Sister        gyn cancer   Heart disease Paternal Grandfather    Colon cancer Neg Hx    Esophageal cancer Neg Hx    Rectal cancer Neg Hx    Stomach cancer Neg Hx    Breast cancer Neg Hx    Ovarian cancer Neg Hx    Endometrial cancer Neg Hx    Pancreatic cancer Neg Hx    Prostate cancer Neg Hx     Social History Social History   Tobacco Use   Smoking status: Former    Types: Cigarettes    Quit date: 06/14/1981    Years since quitting: 40.4   Smokeless tobacco: Never  Vaping Use   Vaping Use: Never used  Substance Use Topics   Alcohol use: No   Drug use: No   Allergies Tylenol [acetaminophen] and Latex  Review of Systems Review of Systems  All other systems reviewed and are negative.   Physical Exam Vital Signs  I have  reviewed the triage vital signs BP 117/65   Pulse 92   Temp 98.6 F (37 C) (Oral)   Resp 17   SpO2 98%   Physical Exam Vitals and nursing note reviewed.  Constitutional:      General: She is not in acute distress.    Appearance: She is well-developed.  HENT:     Head: Normocephalic and atraumatic.  Eyes:     Conjunctiva/sclera:  Conjunctivae normal.  Cardiovascular:     Rate and Rhythm: Normal rate and regular rhythm.     Heart sounds: No murmur heard. Pulmonary:     Effort: Pulmonary effort is normal. No respiratory distress.     Breath sounds: Normal breath sounds.  Abdominal:     Palpations: Abdomen is soft. There is mass.     Tenderness: There is no abdominal tenderness.  Musculoskeletal:        General: No swelling.     Cervical back: Neck supple.  Skin:    General: Skin is warm and dry.     Capillary Refill: Capillary refill takes less than 2 seconds.  Neurological:     Mental Status: She is alert.  Psychiatric:        Mood and Affect: Mood normal.     ED Results and Treatments Labs (all labs ordered are listed, but only abnormal results are displayed) Labs Reviewed  COMPREHENSIVE METABOLIC PANEL - Abnormal; Notable for the following components:      Result Value   Glucose, Bld 123 (*)    BUN 45 (*)    Creatinine, Ser 3.40 (*)    Calcium 8.7 (*)    Albumin 2.4 (*)    AST 72 (*)    ALT 69 (*)    Alkaline Phosphatase 129 (*)    GFR, Estimated 14 (*)    All other components within normal limits  CBC WITH DIFFERENTIAL/PLATELET - Abnormal; Notable for the following components:   WBC 14.5 (*)    RBC 3.06 (*)    Hemoglobin 8.9 (*)    HCT 28.4 (*)    RDW 15.6 (*)    Neutro Abs 12.2 (*)    Monocytes Absolute 1.1 (*)    Abs Immature Granulocytes 0.13 (*)    All other components within normal limits  URINALYSIS, ROUTINE W REFLEX MICROSCOPIC - Abnormal; Notable for the following components:   Color, Urine STRAW (*)    APPearance TURBID (*)    Hgb urine  dipstick LARGE (*)    Protein, ur 100 (*)    Leukocytes,Ua MODERATE (*)    WBC, UA >50 (*)    Bacteria, UA RARE (*)    All other components within normal limits                                                                                                                          Radiology CT Renal Stone Study  Result Date: 11/30/2021 CLINICAL DATA:  Uterine carcinosarcoma.  Abdominal pain. * Tracking Code: BO * EXAM: CT ABDOMEN AND PELVIS WITHOUT CONTRAST TECHNIQUE: Multidetector CT imaging of the abdomen and pelvis was performed following the standard protocol without IV contrast. RADIATION DOSE REDUCTION: This exam was performed according to the departmental dose-optimization program which includes automated exposure control, adjustment of the mA and/or kV according to patient size and/or use of iterative reconstruction technique. COMPARISON:  Pelvic MRI 11/25/2021 FINDINGS: Lower chest: Linear subsegmental atelectasis or  scarring in the posterior basal segment right lower lobe. Lower margin of a right breast implant is visible. Hepatobiliary: Faint dependent density in the gallbladder could represent sludge or small gallstones. Noncontrast CT appearance of the liver otherwise unremarkable. Pancreas: Unremarkable Spleen: Unremarkable Adrenals/Urinary Tract: The adrenal glands appear unremarkable. Currently there is no hydronephrosis and no hydroureter. Urinary bladder unremarkable. No urinary tract calculus observed. Stomach/Bowel: Descending and sigmoid colon diverticulosis without active diverticulitis. Scattered diverticula in the remainder of the colon including the transverse colon. Normal appendix. No dilated bowel. Vascular/Lymphatic: Mild abdominal aortic atherosclerotic vascular calcification. Pathologic retroperitoneal and bilateral pelvic adenopathy. Index left periaortic node 2.3 cm in short axis on image 30 series 2. Index right common iliac node 1.6 cm in short axis on image 48 series  2. Index right external iliac node 2.2 cm in short axis on image 66 series 2. Index left external iliac node 3.0 cm in short axis on image 59 series 2. Substantial superior rectal and perirectal adenopathy noted with a left perirectal lymph node measuring 1.8 cm in short axis on image 66 series 2. Reproductive: Large central uterine mass better depicted on the recent MRI, but measuring about 5.6 cm anterior-posterior on image 110 series 6. Miniscule locules of gas along this mass, possibly biopsy related. Other: No ascites observed. Musculoskeletal: Mild dextroconvex lumbar scoliosis with rotary component. IMPRESSION: 1. Pathologic retroperitoneal and bilateral pelvic adenopathy compatible with metastatic disease. 2. Large central uterine mass better depicted on recent MRI, compatible with malignancy. 3. No current hydronephrosis or hydroureter. 4. Colonic diverticulosis without active diverticulitis. 5. Faint dependent density in the gallbladder could represent sludge or small gallstones. 6. Mild dextroconvex lumbar scoliosis with rotary component. 7. Aortic atherosclerosis. Aortic Atherosclerosis (ICD10-I70.0). Electronically Signed   By: Van Clines M.D.   On: 11/30/2021 19:05    Pertinent labs & imaging results that were available during my care of the patient were reviewed by me and considered in my medical decision making (see MDM for details).  Medications Ordered in ED Medications  cefTRIAXone (ROCEPHIN) 2 g in sodium chloride 0.9 % 100 mL IVPB (has no administration in time range)  lactated ringers bolus 1,000 mL (1,000 mLs Intravenous New Bag/Given 11/30/21 1925)                                                                                                                                     Procedures .Critical Care  Performed by: Teressa Lower, MD Authorized by: Teressa Lower, MD   Critical care provider statement:    Critical care time (minutes):  30   Critical care was  necessary to treat or prevent imminent or life-threatening deterioration of the following conditions:  Dehydration   Critical care was time spent personally by me on the following activities:  Development of treatment plan with patient or surrogate, discussions with consultants, evaluation of patient's response to treatment, examination of patient, ordering and  review of laboratory studies, ordering and review of radiographic studies, ordering and performing treatments and interventions, pulse oximetry, re-evaluation of patient's condition and review of old charts   (including critical care time)  Medical Decision Making / ED Course   This patient presents to the ED for concern of abnormal labs, this involves an extensive number of treatment options, and is a complaint that carries with it a high risk of complications and morbidity.  The differential diagnosis includes obstructive uropathy, nephrolithiasis, hypovolemia, UTI  MDM: Patient seen the emergency room for evaluation of abnormal labs.  Physical exam with a palpable pelvic mass but is otherwise unremarkable.  After evaluation shows a leukocytosis to 14.5, hemoglobin 8.9, BUN is 45, creatinine 3.4, albumin 2.4, AST 72, ALT 69, urinalysis is concerning with moderate leuk esterase, 11-20 red blood cells, greater than 50 white blood cells.  CT stone study with pathologic retroperitoneal and bilateral pelvic adenopathy compatible with metastatic disease as well as a large central uterine mass but no current hydronephrosis or hydroureter.  Fluid resuscitation begun, antibiotics started for concomitant UTI and patient require hospital admission for acute kidney injury.   Additional history obtained: -Additional history obtained from daughter -External records from outside source obtained and reviewed including: Chart review including previous notes, labs, imaging, consultation notes   Lab Tests: -I ordered, reviewed, and interpreted labs.    The pertinent results include:   Labs Reviewed  COMPREHENSIVE METABOLIC PANEL - Abnormal; Notable for the following components:      Result Value   Glucose, Bld 123 (*)    BUN 45 (*)    Creatinine, Ser 3.40 (*)    Calcium 8.7 (*)    Albumin 2.4 (*)    AST 72 (*)    ALT 69 (*)    Alkaline Phosphatase 129 (*)    GFR, Estimated 14 (*)    All other components within normal limits  CBC WITH DIFFERENTIAL/PLATELET - Abnormal; Notable for the following components:   WBC 14.5 (*)    RBC 3.06 (*)    Hemoglobin 8.9 (*)    HCT 28.4 (*)    RDW 15.6 (*)    Neutro Abs 12.2 (*)    Monocytes Absolute 1.1 (*)    Abs Immature Granulocytes 0.13 (*)    All other components within normal limits  URINALYSIS, ROUTINE W REFLEX MICROSCOPIC - Abnormal; Notable for the following components:   Color, Urine STRAW (*)    APPearance TURBID (*)    Hgb urine dipstick LARGE (*)    Protein, ur 100 (*)    Leukocytes,Ua MODERATE (*)    WBC, UA >50 (*)    Bacteria, UA RARE (*)    All other components within normal limits        Imaging Studies ordered: I ordered imaging studies including CT abdomen pelvis I independently visualized and interpreted imaging. I agree with the radiologist interpretation   Medicines ordered and prescription drug management: Meds ordered this encounter  Medications   cefTRIAXone (ROCEPHIN) 2 g in sodium chloride 0.9 % 100 mL IVPB    Order Specific Question:   Antibiotic Indication:    Answer:   UTI   lactated ringers bolus 1,000 mL    -I have reviewed the patients home medicines and have made adjustments as needed  Critical interventions IVF, antibiotics   Cardiac Monitoring: The patient was maintained on a cardiac monitor.  I personally viewed and interpreted the cardiac monitored which showed an underlying rhythm of: NSR  Social Determinants of Health:  Factors impacting patients care include: none   Reevaluation: After the interventions noted above, I  reevaluated the patient and found that they have :stayed the same  Co morbidities that complicate the patient evaluation  Past Medical History:  Diagnosis Date   Adjustment disorder with anxious mood 12/05/2018   Arthralgia 06/29/2015   Breast cancer ([place]) 2010   Colon polyps    ?   Complication of anesthesia    DCIS (ductal carcinoma in situ) of breast 2010   Diverticulosis    Full dentures    Hyperlipidemia    Joint pain    Leukocytosis 07/11/2017   PONV (postoperative nausea and vomiting)    Skin cancer    SCC   Wears glasses       Dispostion: I considered admission for this patient, and due to new acute kidney injury, patient require hospital admission     Final Clinical Impression(s) / ED Diagnoses Final diagnoses:  None     '@PCDICTATION'$ @    Teressa Lower, MD 11/30/21 2149

## 2021-11-30 NOTE — Progress Notes (Addendum)
[place] NOTE  Patient Care Team: Nche, Charlene Brooke, NP as PCP - General (Internal Medicine) Magrinat, Virgie Dad, MD (Inactive) as Consulting Physician (Oncology) Lafayette Dragon, MD (Inactive) as Consulting Physician (Gastroenterology) Owens Loffler, MD as Consulting Physician [facility])  ASSESSMENT & PLAN:  Carcinosarcoma of body of uterus [facility]) I have reviewed her pathology and imaging study with the patient and her daughter We will try to see if we can add PD-L1 to her tissue sample I will reach out to genetic counselor to see if genetic testing is indicated Due to her locally advanced disease, we discussed importance of full staging with CT imaging of the chest, abdomen and pelvis Once results are available, we will decide if she is a candidate for upfront surgery versus chemotherapy I recommend port placement due to poor venous access We discussed risk, benefits, side effects of combination treatment with carboplatin, paclitaxel with or without addition of immunotherapy depending on additional testing I will get baseline blood work and tumor marker today I will reach out to her next week once we have results available  Post-menopausal bleeding She is moderately anemic Observe closely  Weight loss We discussed importance of frequent small meals and high-protein intake  Acute renal failure (ARF) ([place]) At the time of dictation, her renal function came back abnormal This is grossly abnormal compared to her baseline With signs of acute renal failure, my concern is the patient might have compression from her tumor I will not be able to treat her until renal failure resolved I recommend the patient to go to the emergency department for further evaluation and urgent admission She could benefit from imaging study for further evaluation, urology consult if needed and IV fluid resuscitation  Orders Placed This Encounter  Procedures   IR IMAGING  GUIDED PORT INSERTION    Standing Status:   Future    Standing Expiration Date:   12/01/2022    Order Specific Question:   Reason for Exam (SYMPTOM  OR DIAGNOSIS REQUIRED)    Answer:   need port for chemo    Order Specific Question:   Preferred Imaging Location?    Answer:   [facility]   Comprehensive metabolic panel    Standing Status:   Future    Number of Occurrences:   1    Standing Expiration Date:   12/01/2022   CA 125    Standing Status:   Future    Number of Occurrences:   1    Standing Expiration Date:   12/01/2022   CBC with Differential (Cancer Center Only)    Standing Status:   Future    Number of Occurrences:   1    Standing Expiration Date:   12/01/2022    The total time spent in the appointment was 60 minutes encounter with patients including review of chart and various tests results, discussions about plan of care and coordination of care plan   All questions were answered. The patient knows to call the clinic with any problems, questions or concerns. No barriers to learning was detected.  Heath Lark, MD 11/21/20234:05 PM  CHIEF COMPLAINTS/PURPOSE OF CONSULTATION:  Urine cancer, for further evaluation  HISTORY OF PRESENTING ILLNESS:  Kayla Mcgrath 76 y.o. female is here because of because of recent diagnosis of uterine cancer She is here accompanied by her daughter, [name] The patient is a retired Corporate treasurer She has remote history of breast cancer status post lumpectomy and tamoxifen She is  a principal caregiver for her husband who suffers from dementia Her symptoms started with postmenopausal bleeding around September.  Now, she has intermittent sensation of abdominal distention/bloating and reduced appetite  I have reviewed her chart and materials related to her cancer extensively and collaborated history with the patient. Summary of oncologic history is as follows: Oncology History  Carcinosarcoma of body of uterus ([place])  09/23/2021 Initial Diagnosis    She presented with PMB   09/28/2021 Imaging   US pelvis Enlarged uterus with fibroids. Pelvic sonogram is otherwise unremarkable.    11/11/2021 Pathology Results   A. CERVIX, MASS, EXCISION:  - HIGH-GRADE UNDIFFERENTIATED MALIGNANCY WITH EXTENSIVE NECROSIS (SEE COMMENT).   COMMENT:  The tumor consists of mostly high-grade sarcomatoid morphology with focal epithelioid morphology.  Immunohistochemical stains are performed on block A4.  The tumor cells are positive for desmin and p16 while negative for CD45, CK7, CK20, chromogranin, synaptophysin, Melan-A, p40, AE1/AE3, SMA, p63, cyclin D1, S100 and smooth muscle myosin.  Ki-67 proliferation index is high.  CD10 staining shows focal nonspecific staining.  The differential diagnosis includes a high-grade sarcoma and a partially sampled carcinosarcoma.    11/25/2021 Imaging   MRI pelvis Marked distention of the entire endometrial cavity by heterogeneously enhancing soft tissue density, which extends through the endocervical canal into the lower vagina. This is highly suspicious for endometrial carcinoma.   Diffuse myometrial thinning due to marked distention of endometrial cavity limits evaluation; deep myometrial invasion cannot be excluded in the uterine fundus.   No evidence of extra-uterine extension of tumor.   Lymphadenopathy in lower abdominal retroperitoneum, bilateral iliac chains, and sigmoid mesocolon, consistent with metastatic disease.   Sigmoid diverticulosis. No radiographic evidence of diverticulitis.   11/26/2021 Initial Diagnosis   Uterine cancer ([place])   11/26/2021 Cancer Staging   Staging form: Corpus Uteri - Carcinoma and Carcinosarcoma, AJCC 8th Edition - Clinical stage from 11/26/2021: FIGO Stage IIIC2 (cT3, cN2, cM0) - Signed by Heath Lark, MD on 11/26/2021 Stage prefix: Initial diagnosis Histologic grade (G): G3 Histologic grading system: 3 grade system     MEDICAL HISTORY:  Past Medical History:  Diagnosis  Date   Adjustment disorder with anxious mood 12/05/2018   Arthralgia 06/29/2015   Breast cancer ([place]) 2010   Colon polyps    ?   Complication of anesthesia    DCIS (ductal carcinoma in situ) of breast 2010   Diverticulosis    Full dentures    Hyperlipidemia    Joint pain    Leukocytosis 07/11/2017   PONV (postoperative nausea and vomiting)    Skin cancer    SCC   Wears glasses     SURGICAL HISTORY: Past Surgical History:  Procedure Laterality Date   BREAST BIOPSY Right 05/11/2012   BREAST BIOPSY Right 04/24/2012   BREAST LUMPECTOMY Right 12/2008   cancer-tamoxifen   BREAST RECONSTRUCTION WITH PLACEMENT OF TISSUE EXPANDER AND FLEX HD (ACELLULAR HYDRATED DERMIS) Right 06/20/2012   Procedure: BREAST RECONSTRUCTION WITH PLACEMENT OF TISSUE EXPANDER AND FLEX HD (ACELLULAR HYDRATED DERMIS);  Surgeon: Theodoro Kos, DO;  Location: [facility];  Service: Plastics;  Laterality: Right;   BREAST REDUCTION WITH MASTOPEXY Left 10/11/2012   Procedure: LEFT BREAST REDUCTION WITH MASTOPEXY;  Surgeon: Theodoro Kos, DO;  Location: [facility];  Service: Plastics;  Laterality: Left;   cataract sugery Bilateral 03/2019   COLONOSCOPY  08/28/2013   [place]   DILATION AND CURETTAGE OF UTERUS     heavy bleeding between two pregnancies  LIPOSUCTION WITH LIPOFILLING Left 10/11/2012   Procedure: LIPOSUCTION WITH LIPOFILLING;  Surgeon: Theodoro Kos, DO;  Location: [facility];  Service: Plastics;  Laterality: Left;   MASTECTOMY Right 06/20/2012   MASTECTOMY W/ SENTINEL NODE BIOPSY Right 06/20/2012   Procedure: right skin sparing MASTECTOMY WITH SENTINEL LYMPH NODE BIOPSY;  Surgeon: Stark Klein, MD;  Location: [facility];  Service: General;  Laterality: Right;   PARATHYROIDECTOMY Left 11/07/2017   Procedure: NECK EXPLORATION WITH LEFT THYROID LOBECTOMY;  Surgeon: [name], MD;  Location: [facility];  Service: General;  Laterality: Left;    POLYPECTOMY     REDUCTION MAMMAPLASTY Left 10/2012   REMOVAL OF TISSUE EXPANDER AND PLACEMENT OF IMPLANT Right 10/11/2012   Procedure: REMOVAL OF TISSUE EXPANDER AND PLACEMENT OF SILICONE IMPLANT RIGHT;  Surgeon: Theodoro Kos, DO;  Location: [facility];  Service: Plastics;  Laterality: Right;    SOCIAL HISTORY: Social History   Socioeconomic History   Marital status: Married    Spouse name: Not on file   Number of children: 2   Years of education: Not on file   Highest education level: Not on file  Occupational History   Occupation: retired LPN  Tobacco Use   Smoking status: Former    Types: Cigarettes    Quit date: 06/14/1981    Years since quitting: 40.4   Smokeless tobacco: Never  Vaping Use   Vaping Use: Never used  Substance and Sexual Activity   Alcohol use: No   Drug use: No   Sexual activity: Not Currently    Birth control/protection: Post-menopausal  Other Topics Concern   Not on file  Social History Narrative   Recently moved from [place] to be closer to her two daughters and grandchildren.   Retired Corporate treasurer.   DNR- forms singed on 05/07/2012 and returned to pt.   Social Determinants of Health   Financial Resource Strain: Low Risk  (02/02/2021)   Overall Financial Resource Strain (CARDIA)    Difficulty of Paying Living Expenses: Not hard at all  Food Insecurity: No Food Insecurity (02/02/2021)   Hunger Vital Sign    Worried About Running Out of Food in the Last Year: Never true    Ran Out of Food in the Last Year: Never true  Transportation Needs: No Transportation Needs (02/02/2021)   PRAPARE - Hydrologist (Medical): No    Lack of Transportation (Non-Medical): No  Physical Activity: Insufficiently Active (02/02/2021)   Exercise Vital Sign    Days of Exercise per Week: 3 days    Minutes of Exercise per Session: 30 min  Stress: No Stress Concern Present (02/02/2021)   [place]    Feeling of Stress : Not at all  Social Connections: Moderately Integrated (02/02/2021)   Social Connection and Isolation Panel [NHANES]    Frequency of Communication with Friends and Family: Twice a week    Frequency of Social Gatherings with Friends and Family: Twice a week    Attends Religious Services: Never    Marine scientist or Organizations: Yes    Attends Music therapist: More than 4 times per year    Marital Status: Married  Human resources officer Violence: Not At Risk (02/02/2021)   Humiliation, Afraid, Rape, and Kick questionnaire    Fear of Current or Ex-Partner: No    Emotionally Abused: No    Physically Abused: No    Sexually Abused:  No    FAMILY HISTORY: Family History  Problem Relation Age of Onset   Alcohol abuse Mother    Colon polyps Mother    Mental illness Mother    Alcohol abuse Father        suicide   Mental illness Father    Cancer Sister        gyn cancer   Heart disease Paternal Grandfather    Colon cancer Neg Hx    Esophageal cancer Neg Hx    Rectal cancer Neg Hx    Stomach cancer Neg Hx    Breast cancer Neg Hx    Ovarian cancer Neg Hx    Endometrial cancer Neg Hx    Pancreatic cancer Neg Hx    Prostate cancer Neg Hx     ALLERGIES:  is allergic to tylenol [acetaminophen] and latex.  MEDICATIONS:  Current Outpatient Medications  Medication Sig Dispense Refill   Cholecalciferol (VITAMIN D) 2000 UNITS CAPS Take one by mouth daily     escitalopram (LEXAPRO) 10 MG tablet Take 1 tablet (10 mg total) by mouth daily. 90 tablet 3   Famotidine (PEPCID PO) Take by mouth.     magnesium 30 MG tablet Take 30 mg by mouth daily.     OVER THE COUNTER MEDICATION CBD Cream-Apply to knees at bedtime     rosuvastatin (CRESTOR) 20 MG tablet CRESTOR 20MG 1X/WEEK X 1WEEK, THEN 2X/WEEK X 1WEEK, THEN 3X/WEEK CONTINUOUSLY. 12 tablet 3   Turmeric 400 MG CAPS Take 300 mg by mouth daily.     vitamin B-12 (CYANOCOBALAMIN) 1000 MCG  tablet Take 1 tablet by mouth daily at 2 PM.     No current facility-administered medications for this visit.    REVIEW OF SYSTEMS:   Constitutional: Denies fevers, chills or abnormal night sweats Eyes: Denies blurriness of vision, double vision or watery eyes Ears, nose, mouth, throat, and face: Denies mucositis or sore throat Respiratory: Denies cough, dyspnea or wheezes Cardiovascular: Denies palpitation, chest discomfort or lower extremity swelling Skin: Denies abnormal skin rashes Lymphatics: Denies new lymphadenopathy or easy bruising Neurological:Denies numbness, tingling or new weaknesses Behavioral/Psych: Mood is stable, no new changes  All other systems were reviewed with the patient and are negative.  PHYSICAL EXAMINATION: ECOG PERFORMANCE STATUS: 1 - Symptomatic but completely ambulatory  Vitals:   11/30/21 1300  BP: 127/67  Pulse: (!) 103  Resp: 18  Temp: 98.1 F (36.7 C)  SpO2: 100%   Filed Weights   11/30/21 1300  Weight: 146 lb 5 oz (66.4 kg)    GENERAL:alert, no distress and comfortable SKIN: skin color, texture, turgor are normal, no rashes or significant lesions EYES: normal, conjunctiva are pink and non-injected, sclera clear OROPHARYNX:no exudate, no erythema and lips, buccal mucosa, and tongue normal  NECK: supple, thyroid normal size, non-tender, without nodularity LYMPH:  no palpable lymphadenopathy in the cervical, axillary or inguinal LUNGS: clear to auscultation and percussion with normal breathing effort HEART: regular rate & rhythm and no murmurs and no lower extremity edema ABDOMEN:abdomen soft, non-tender and normal bowel sounds.  Probable soft tissue mass on deep palpation Musculoskeletal:no cyanosis of digits and no clubbing  PSYCH: alert & oriented x 3 with fluent speech NEURO: no focal motor/sensory deficits  LABORATORY DATA:  I have reviewed the data as listed Lab Results  Component Value Date   WBC 16.6 (H) 11/30/2021   HGB 9.6  (L) 11/30/2021   HCT 29.5 (L) 11/30/2021   MCV 90.5 11/30/2021   PLT  395 11/30/2021   Recent Labs    01/15/21 1113 11/30/21 1500  NA 136 138  K 4.2 4.6  CL 101 104  CO2 30 27  GLUCOSE 101* 150*  BUN 20 45*  CREATININE 0.78 3.18*  CALCIUM 9.4 9.4  GFRNONAA  --  15*  PROT  --  7.5  ALBUMIN  --  3.2*  AST  --  74*  ALT  --  69*  ALKPHOS  --  142*  BILITOT  --  0.4    RADIOGRAPHIC STUDIES: I have personally reviewed the radiological images as listed and agreed with the findings in the report. MR PELVIS W WO CONTRAST  Result Date: 11/25/2021 CLINICAL DATA:  Vaginal bleeding for 1 month.  Pelvic mass. EXAM: MRI PELVIS WITHOUT AND WITH CONTRAST TECHNIQUE: Multiplanar multisequence MR imaging of the pelvis was performed both before and after administration of intravenous contrast. CONTRAST:  6.76m GADAVIST GADOBUTROL 1 MMOL/ML IV SOLN COMPARISON:  None Available. FINDINGS: Lower Urinary Tract: No urinary bladder or urethral abnormality identified. Bowel: Sigmoid diverticulosis is noted, without evidence of diverticulitis. Vascular/Lymphatic: Lymphadenopathy is seen throughout the visualized lower abdominal retroperitoneum, bilateral iliac chains, in sigmoid mesocolon. Largest index lymph node is seen in the left external iliac chain measuring 2.6 cm short axis on image 15/3. This is consistent with metastatic disease. Reproductive: -- Uterus: Measures 16.3 x 8.4 by 11.8 cm (volume = 850 cm^3). No uterine fibroids identified. Marked distention of the entire endometrial cavity is seen by heterogeneously enhancing soft tissue density. This extends through the endocervical now in into the lower vagina. Double-layer thickness measures 71 mm. Diffuse myometrial thinning is seen due to marked distention of the endometrial cavity. This limits evaluation, however there is severe myometrial thinning seen in the fundus; deep myometrial invasion cannot be excluded. No evidence of extra uterine extension  of tumor. -- Right ovary: Not visualized. -- Left ovary:  Not visualized. Other: No peritoneal thickening or abnormal free fluid. Musculoskeletal:  Unremarkable. IMPRESSION: Marked distention of the entire endometrial cavity by heterogeneously enhancing soft tissue density, which extends through the endocervical canal into the lower vagina. This is highly suspicious for endometrial carcinoma. Diffuse myometrial thinning due to marked distention of endometrial cavity limits evaluation; deep myometrial invasion cannot be excluded in the uterine fundus. No evidence of extra-uterine extension of tumor. Lymphadenopathy in lower abdominal retroperitoneum, bilateral iliac chains, and sigmoid mesocolon, consistent with metastatic disease. Sigmoid diverticulosis. No radiographic evidence of diverticulitis. Electronically Signed   By: JMarlaine HindM.D.   On: 11/25/2021 15:42

## 2021-11-30 NOTE — Assessment & Plan Note (Signed)
She is moderately anemic Observe closely

## 2021-11-30 NOTE — Progress Notes (Signed)
GYNECOLOGIC ONCOLOGY NEW PATIENT CONSULTATION   Patient Name: Kayla Mcgrath  Patient Age: 76 y.o. Date of Service: 11/30/21 Referring Provider: Dr. 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  I reviewed with the patient and her daughter recent workup including imaging studies and biopsy of mass prolapsing through the cervix.  They reviewed MRI images with Dr. Alvy Mcgrath at their visit today.  We discussed biopsy which supports either a biphasic tumor (a carcinosarcoma) versus a sarcoma.  There is a small epithelial component seen within the biopsy performed last week.  To help better elucidate the diagnosis, I have recommended additional tissue sampling today which was performed without difficulty.  We discussed that both biopsy results as well as additional staging imaging will help with the treatment decisions.  On MRI of the pelvis, there is significant retroperitoneal adenopathy concerning for metastatic disease.  Patient has not had upper abdominal or chest imaging.  Given her high risk histology, we discussed the importance of completing her imaging staging.  This was ordered today and scheduled.  In terms of treatment, we discussed combination of surgery and systemic therapy.  If biopsy from today supports a sarcoma, then we will likely be discussing upfront surgery.  If biopsy supports carcinosarcoma, especially given burden of disease in the pelvis, then would recommend neoadjuvant chemotherapy with carboplatin, paclitaxel, and possibly the addition of immunotherapy.   Given her urinary symptoms and leukocytosis, noted at the end of our visit, urinalysis and urine culture were ordered.  The patient was unfortunately unable to urinate.  While we were waiting  for her to be able to void, her CMP resulted showing acute renal failure with a creatinine over 3 as well as transaminitis.  Given late hour in the day, I spoke with Dr. Alvy Mcgrath, and our recommendation is for the patient to present to the emergency department to facilitate workup and admission.  Since she will be able to receive IV contrast, she can undergo CT to assess the liver and kidneys as well as upper abdominal disease burden.  We discussed need for IV hydration.  A copy of this note was sent to the patient's referring provider.   60 minutes of total time was spent for this patient encounter, including preparation, face-to-face counseling with the patient and coordination of care, and documentation of the encounter.  Kayla Pinch, MD  Division of Gynecologic Oncology  Department of Obstetrics and Gynecology  [facility]  ___________________________________________  Chief Complaint: Chief Complaint  Patient presents with   Malignant neoplasm of cervix, unspecified site [facility])    History of Present Illness:  Kayla Mcgrath is a 76 y.o. y.o. female who is seen in consultation at the request of Dr. 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  Patient reports having some urinary frequency and mild vaginal discharge initially.  Then in mid September, she began having spotting.  This was initially dark brown or dark red.  This stopped shortly after starting.  The patient saw her primary care provider and ultimately underwent pelvic imaging and was referred to a gynecologist.  Pelvic ultrasound on 9/19 showed an enlarged uterus with fibroids, measuring 18.5 x 8.4 x 11.3 cm with inhomogeneous echogenicity in myometrium.   Biopsy of prolapsing vaginal mass performed on 11/2 showed high-grade undifferentiated  malignancy with extensive necrosis, suspected to be high-grade sarcoma versus partially sampled  carcinosarcoma.  MRI of the pelvis on 11/16 showed marked distention oft entire endometrial cavity by heterogeneously enhancing soft tissue density, extending through the endocervical cancal into the lower vagina. Diffuse myometrial thinning due to marked distention of the cavity limits evaluation. Deep myometrial invasion cannot be excluded. Lymphadenopathy in lower abdominal retroperitoneum, bilateral iliac chains, sigmoid mesocolon are c/w metastatic disease. Sigmoid diverticulosis noted.  Today, the patient presents with her daughter, who is a Marine scientist within the [employer] system.  She reports that since mid September, she is having continued bleeding, alternating between heavier days and lighter days.  She has also had some passage of up to egg sized clots.  She sometimes will just have small amount of mucus or discharge.  Before heavier episodes of bleeding, she often experiences some pelvic cramping.  Otherwise she denies any abdominal pain.  She endorses normal bowel function.  She sometimes feels like she is getting a urinary tract infection at night, endorsing some mild dysuria as well as incontinence.  The symptoms usually have improved by the morning.  She has lost approximately 7 pounds since September.  Denies any early satiety but notes decreased appetite and not being interested in eating.  She denies any nausea or emesis.  The patient is a retired Corporate treasurer.    PAST MEDICAL HISTORY:  Past Medical History:  Diagnosis Date   Adjustment disorder with anxious mood 12/05/2018   Arthralgia 06/29/2015   Breast cancer ([place]) 2010   Colon polyps    ?   Complication of anesthesia    DCIS (ductal carcinoma in situ) of breast 2010   Diverticulosis    Full dentures    Hyperlipidemia    Joint pain    Leukocytosis 07/11/2017   PONV (postoperative nausea and vomiting)    Skin cancer    SCC   Wears glasses      PAST SURGICAL HISTORY:  Past Surgical History:  Procedure Laterality Date   BREAST  BIOPSY Right 05/11/2012   BREAST BIOPSY Right 04/24/2012   BREAST LUMPECTOMY Right 12/2008   cancer-tamoxifen   BREAST RECONSTRUCTION WITH PLACEMENT OF TISSUE EXPANDER AND FLEX HD (ACELLULAR HYDRATED DERMIS) Right 06/20/2012   Procedure: BREAST RECONSTRUCTION WITH PLACEMENT OF TISSUE EXPANDER AND FLEX HD (ACELLULAR HYDRATED DERMIS);  Surgeon: Theodoro Kos, DO;  Location: [place];  Service: Plastics;  Laterality: Right;   BREAST REDUCTION WITH MASTOPEXY Left 10/11/2012   Procedure: LEFT BREAST REDUCTION WITH MASTOPEXY;  Surgeon: Theodoro Kos, DO;  Location: [place];  Service: Plastics;  Laterality: Left;   cataract sugery Bilateral 03/2019   COLONOSCOPY  08/28/2013   [place]   DILATION AND CURETTAGE OF UTERUS     heavy bleeding between two pregnancies   LIPOSUCTION WITH LIPOFILLING Left 10/11/2012   Procedure: LIPOSUCTION WITH LIPOFILLING;  Surgeon: Theodoro Kos, DO;  Location: [place];  Service: Plastics;  Laterality: Left;   MASTECTOMY Right 06/20/2012   MASTECTOMY W/ SENTINEL NODE BIOPSY Right 06/20/2012   Procedure: right skin sparing MASTECTOMY WITH SENTINEL LYMPH NODE BIOPSY;  Surgeon: Stark Klein, MD;  Location: [place];  Service: General;  Laterality: Right;   PARATHYROIDECTOMY Left 11/07/2017   Procedure: NECK EXPLORATION WITH LEFT THYROID LOBECTOMY;  Surgeon: [name], MD;  Location: [facility];  Service: General;  Laterality: Left;   POLYPECTOMY     REDUCTION MAMMAPLASTY Left 10/2012   REMOVAL OF TISSUE EXPANDER AND  PLACEMENT OF IMPLANT Right 10/11/2012   Procedure: REMOVAL OF TISSUE EXPANDER AND PLACEMENT OF SILICONE IMPLANT RIGHT;  Surgeon: Theodoro Kos, DO;  Location: [place];  Service: Plastics;  Laterality: Right;    OB/GYN HISTORY:  OB History  Gravida Para Term Preterm AB Living  4 2          SAB IAB Ectopic Multiple Live Births               # Outcome Date GA Lbr Len/2nd  Weight Sex Delivery Anes PTL Lv  4 Gravida           3 Gravida           2 Para           1 Para             No LMP recorded. Patient is postmenopausal.  Age at menarche: 49 Age at menopause: 60 Hx of HRT: Denies.  Patient was on an aromatase inhibitor for approximately 2 years after her breast cancer diagnosis followed by tamoxifen for 3 years.  She has been off hormonal therapy in the setting of her breast cancer since 2015. Hx of STDs: Denies Last pap: Unsure.   History of abnormal pap smears: Patient reports a very remote history of an abnormal Pap that required only a follow-up Pap test  SCREENING STUDIES:  Last mammogram: 08/2021  Last colonoscopy: 2022 Last bone mineral density: 06/2021  MEDICATIONS: Outpatient Encounter Medications as of 11/30/2021  Medication Sig   Cholecalciferol (VITAMIN D) 2000 UNITS CAPS Take one by mouth daily   escitalopram (LEXAPRO) 10 MG tablet Take 1 tablet (10 mg total) by mouth daily.   Famotidine (PEPCID PO) Take by mouth.   magnesium 30 MG tablet Take 30 mg by mouth daily.   OVER THE COUNTER MEDICATION CBD Cream-Apply to knees at bedtime   rosuvastatin (CRESTOR) 20 MG tablet CRESTOR '20MG'$  1X/WEEK X 1WEEK, THEN 2X/WEEK X 1WEEK, THEN 3X/WEEK CONTINUOUSLY.   Turmeric 400 MG CAPS Take 300 mg by mouth daily.   vitamin B-12 (CYANOCOBALAMIN) 1000 MCG tablet Take 1 tablet by mouth daily at 2 PM.   No facility-administered encounter medications on file as of 11/30/2021.    ALLERGIES:  Allergies  Allergen Reactions   Tylenol [Acetaminophen] Hives    Lip swelling   Latex Rash     FAMILY HISTORY:  Family History  Problem Relation Age of Onset   Alcohol abuse Mother    Colon polyps Mother    Mental illness Mother    Alcohol abuse Father        suicide   Mental illness Father    Cancer Sister        gyn cancer   Heart disease Paternal Grandfather    Colon cancer Neg Hx    Esophageal cancer Neg Hx    Rectal cancer Neg Hx    Stomach  cancer Neg Hx    Breast cancer Neg Hx    Ovarian cancer Neg Hx    Endometrial cancer Neg Hx    Pancreatic cancer Neg Hx    Prostate cancer Neg Hx      SOCIAL HISTORY:  Social Connections: Moderately Integrated (02/02/2021)   Social Connection and Isolation Panel [NHANES]    Frequency of Communication with Friends and Family: Twice a week    Frequency of Social Gatherings with Friends and Family: Twice a week    Attends Religious Services: Never    Active Member of  Clubs or Organizations: Yes    Attends Music therapist: More than 4 times per year    Marital Status: Married    REVIEW OF SYSTEMS:  + Decreased appetite, mild fatigue, bloating, abdominal cramping, pelvic pain, vaginal bleeding, vaginal discharge, anxiety, depression. Denies fevers, chills, unexplained weight changes. Denies hearing loss, neck lumps or masses, mouth sores, ringing in ears or voice changes. Denies cough or wheezing.  Denies shortness of breath. Denies chest pain or palpitations. Denies leg swelling. Denies blood in stools, constipation, diarrhea, nausea, vomiting, or early satiety. Denies pain with intercourse, dysuria, frequency, hematuria or incontinence. Denies joint pain, back pain or muscle pain/cramps. Denies itching, rash, or wounds. Denies dizziness, headaches, numbness or seizures. Denies swollen lymph nodes or glands, denies easy bruising or bleeding. Denies confusion, or decreased concentration.  Physical Exam:  Vital Signs for this encounter:  Blood pressure 139/67, pulse 95, temperature 98.6 F (37 C), temperature source Oral, resp. rate 18, weight 146 lb 3.2 oz (66.3 kg), SpO2 100 %. Body mass index is 28.55 kg/m. General: Alert, oriented, no acute distress.  HEENT: Normocephalic, atraumatic. Sclera anicteric.  Chest: Clear to auscultation bilaterally. No wheezes, rhonchi, or rales. Cardiovascular: Regular rate and rhythm, no murmurs, rubs, or gallops.  Abdomen:  Normoactive bowel sounds. Soft, mildly distended, nontender to palpation.  Fullness appreciated up to the level of the umbilicus, better appreciated on pelvic exam. Extremities: Grossly normal range of motion. Warm, well perfused. No edema bilaterally.  Skin: No rashes or lesions.  Lymphatics: No cervical, supraclavicular, or inguinal adenopathy.  GU:  Normal external female genitalia. No lesions.              Bladder/urethra:  No lesions or masses, well supported bladder             Vagina: Mildly atrophic.  Within the vagina, there is a somewhat necrotic appearing fleshy tumor that extends down to within approximately 4 cm of the vaginal introitus.  This is large and polypoid in appearance.  There is foul-smelling discharge within the upper vagina.             Cervix: Unable to visualize the cervix.  On bimanual exam, the mass is prolapsing through the cervix with a stalk that is approximately 2 cm.  The cervix is normal around the prolapsing mass circumferentially.             Uterus: Significantly enlarged, minimally mobile.             Adnexa: Difficult to assess, no masses otherwise appreciated.  Rectal: Deferred.  Prolapsing mass biopsy procedure Preoperative diagnosis: Uterine carcinosarcoma versus sarcoma Postoperative diagnosis: Same as above Physician: Berline Lopes MD Estimated blood loss: Minimal Specimens: Prolapsing endometrial mass biopsy Procedure: After the procedure was discussed with the patient including risks and benefits, she gave consent.  She was then placed in dorsolithotomy position and a speculum was placed in the vagina.  Once the mass was well visualized it was cleansed with Betadine x3.  Forceps were then used to take multiple biopsies of the mass, attempting to remove some of the more viable appearing tumor closer to the cervical os.  This was placed in formalin.  Monsel's was used to achieve hemostasis.  Overall the patient tolerated the procedure well.  All instruments  were removed from the vagina.   LABORATORY AND RADIOLOGIC DATA:  Outside medical records were reviewed to synthesize the above history, along with the history and physical obtained during the visit.  Lab Results  Component Value Date   WBC 16.6 (H) 11/30/2021   HGB 9.6 (L) 11/30/2021   HCT 29.5 (L) 11/30/2021   PLT 395 11/30/2021   GLUCOSE 150 (H) 11/30/2021   CHOL 156 07/19/2021   TRIG 85.0 07/19/2021   HDL 55.20 07/19/2021   LDLDIRECT 161.9 04/30/2012   LDLCALC 83 07/19/2021   ALT 69 (H) 11/30/2021   AST 74 (H) 11/30/2021   NA 138 11/30/2021   K 4.6 11/30/2021   CL 104 11/30/2021   CREATININE 3.18 (HH) 11/30/2021   BUN 45 (H) 11/30/2021   CO2 27 11/30/2021   TSH 3.12 01/15/2021   HGBA1C 5.9 07/19/2021

## 2021-11-30 NOTE — Assessment & Plan Note (Signed)
I have reviewed her pathology and imaging study with the patient and her daughter We will try to see if we can add PD-L1 to her tissue sample I will reach out to genetic counselor to see if genetic testing is indicated Due to her locally advanced disease, we discussed importance of full staging with CT imaging of the chest, abdomen and pelvis Once results are available, we will decide if she is a candidate for upfront surgery versus chemotherapy I recommend port placement due to poor venous access We discussed risk, benefits, side effects of combination treatment with carboplatin, paclitaxel with or without addition of immunotherapy depending on additional testing I will get baseline blood work and tumor marker today I will reach out to her next week once we have results available

## 2021-12-01 ENCOUNTER — Inpatient Hospital Stay (HOSPITAL_COMMUNITY): Payer: Medicare HMO

## 2021-12-01 ENCOUNTER — Other Ambulatory Visit: Payer: Self-pay

## 2021-12-01 ENCOUNTER — Other Ambulatory Visit: Payer: Self-pay | Admitting: Hematology and Oncology

## 2021-12-01 DIAGNOSIS — N179 Acute kidney failure, unspecified: Secondary | ICD-10-CM

## 2021-12-01 DIAGNOSIS — C549 Malignant neoplasm of corpus uteri, unspecified: Secondary | ICD-10-CM

## 2021-12-01 LAB — COMPREHENSIVE METABOLIC PANEL
ALT: 57 U/L — ABNORMAL HIGH (ref 0–44)
AST: 53 U/L — ABNORMAL HIGH (ref 15–41)
Albumin: 2 g/dL — ABNORMAL LOW (ref 3.5–5.0)
Alkaline Phosphatase: 100 U/L (ref 38–126)
Anion gap: 5 (ref 5–15)
BUN: 43 mg/dL — ABNORMAL HIGH (ref 8–23)
CO2: 27 mmol/L (ref 22–32)
Calcium: 8.4 mg/dL — ABNORMAL LOW (ref 8.9–10.3)
Chloride: 109 mmol/L (ref 98–111)
Creatinine, Ser: 2.98 mg/dL — ABNORMAL HIGH (ref 0.44–1.00)
GFR, Estimated: 16 mL/min — ABNORMAL LOW (ref >60)
Glucose, Bld: 99 mg/dL (ref 70–99)
Potassium: 3.9 mmol/L (ref 3.5–5.1)
Sodium: 141 mmol/L (ref 135–145)
Total Bilirubin: 0.4 mg/dL (ref 0.3–1.2)
Total Protein: 5.8 g/dL — ABNORMAL LOW (ref 6.5–8.1)

## 2021-12-01 LAB — CBC WITH DIFFERENTIAL/PLATELET
Abs Immature Granulocytes: 0.08 10*3/uL — ABNORMAL HIGH (ref 0.00–0.07)
Basophils Absolute: 0.1 10*3/uL (ref 0.0–0.1)
Basophils Relative: 0 %
Eosinophils Absolute: 0.2 10*3/uL (ref 0.0–0.5)
Eosinophils Relative: 1 %
HCT: 22.9 % — ABNORMAL LOW (ref 36.0–46.0)
Hemoglobin: 7.3 g/dL — ABNORMAL LOW (ref 12.0–15.0)
Immature Granulocytes: 1 %
Lymphocytes Relative: 11 %
Lymphs Abs: 1.3 10*3/uL (ref 0.7–4.0)
MCH: 29.3 pg (ref 26.0–34.0)
MCHC: 31.9 g/dL (ref 30.0–36.0)
MCV: 92 fL (ref 80.0–100.0)
Monocytes Absolute: 0.8 10*3/uL (ref 0.1–1.0)
Monocytes Relative: 7 %
Neutro Abs: 8.9 10*3/uL — ABNORMAL HIGH (ref 1.7–7.7)
Neutrophils Relative %: 80 %
Platelets: 302 10*3/uL (ref 150–400)
RBC: 2.49 MIL/uL — ABNORMAL LOW (ref 3.87–5.11)
RDW: 15.8 % — ABNORMAL HIGH (ref 11.5–15.5)
WBC: 11.3 10*3/uL — ABNORMAL HIGH (ref 4.0–10.5)
nRBC: 0 % (ref 0.0–0.2)

## 2021-12-01 LAB — FERRITIN: Ferritin: 418 ng/mL — ABNORMAL HIGH (ref 11–307)

## 2021-12-01 LAB — SODIUM, URINE, RANDOM: Sodium, Ur: 81 mmol/L

## 2021-12-01 LAB — IRON AND TIBC
Iron: 30 ug/dL (ref 28–170)
Saturation Ratios: 19 % (ref 10.4–31.8)
TIBC: 155 ug/dL — ABNORMAL LOW (ref 250–450)
UIBC: 125 ug/dL

## 2021-12-01 LAB — FOLATE: Folate: 6.4 ng/mL (ref >5.9)

## 2021-12-01 LAB — PREPARE RBC (CROSSMATCH)

## 2021-12-01 LAB — CA 125: Cancer Antigen (CA) 125: 491 U/mL — ABNORMAL HIGH (ref 0.0–38.1)

## 2021-12-01 LAB — HEMOGLOBIN AND HEMATOCRIT, BLOOD
HCT: 26.6 % — ABNORMAL LOW (ref 36.0–46.0)
Hemoglobin: 8.6 g/dL — ABNORMAL LOW (ref 12.0–15.0)

## 2021-12-01 LAB — MAGNESIUM: Magnesium: 1.8 mg/dL (ref 1.7–2.4)

## 2021-12-01 LAB — CREATININE, URINE, RANDOM: Creatinine, Urine: 87 mg/dL

## 2021-12-01 LAB — PROTIME-INR
INR: 1.3 — ABNORMAL HIGH (ref 0.8–1.2)
Prothrombin Time: 15.9 seconds — ABNORMAL HIGH (ref 11.4–15.2)

## 2021-12-01 LAB — VITAMIN B12: Vitamin B-12: 597 pg/mL (ref 180–914)

## 2021-12-01 LAB — GAMMA GT: GGT: 26 U/L (ref 7–50)

## 2021-12-01 MED ORDER — FAMOTIDINE 20 MG PO TABS
10.0000 mg | ORAL_TABLET | Freq: Every day | ORAL | Status: DC
  Administered 2021-12-01: 10 mg via ORAL
  Filled 2021-12-01 (×2): qty 1

## 2021-12-01 MED ORDER — ALUM & MAG HYDROXIDE-SIMETH 200-200-20 MG/5ML PO SUSP: 15.0000 mL | Freq: Four times a day (QID) | ORAL | Status: DC | PRN

## 2021-12-01 MED ORDER — SODIUM CHLORIDE 0.9% IV SOLUTION: Freq: Once | INTRAVENOUS | Status: AC

## 2021-12-01 MED ORDER — DIPHENHYDRAMINE HCL 25 MG PO CAPS
25.0000 mg | ORAL_CAPSULE | Freq: Once | ORAL | Status: AC
  Administered 2021-12-01: 25 mg via ORAL
  Filled 2021-12-01: qty 1

## 2021-12-01 NOTE — Hospital Course (Addendum)
76 year old female with recently diagnosed endometrial carcinoma, GAD, HLD, recent decline in appetite seen by GYN oncology Dr Berline Lopes as outpatient with plan for CT chest abdomen pelvis with contrast for staging along with port Placement in anticipation of chemotherapy, noted to have elevated creatinine of 3.4 from recent baseline 0.16 January 2021 so referred to the ED In the ED BP 120s/50s, afebrile. Labs with AKI transaminitis leukocytosis 14.5 anemia 8.9 CT renal study: Pathology retroperitoneal and bilateral pelvic adenopathy compatible with metastatic disease, large central uterine mass, no hydronephrosis/hydroureter, colonic diverticulosis noted sludge in the gallbladder noted.  Oncology consulted and patient admitted for further management. Anemia improving blood transfusion. Overall patient is medically stable and improved, oncology obtain CT imaging without contrast confirmed multiple lymph node involvement, repeat endometrial biopsy pending.  CT chest no evidence of metastatic disease in her lung ultrasound liver did not reveal any evidence of liver metastasis.  Oncology advised discharge and they will arrange for outpatient follow-up soon.

## 2021-12-01 NOTE — Progress Notes (Signed)
GYN Oncology Progress Note  Patient alert, oriented, resting in bed in no acute distress with daughter at the bedside. All questions answered from daughter including reasoning for the [place] abd, need for scheduled scans on Friday etc. No needs voiced at the end of visit. Advised to notify RN of any further questions.

## 2021-12-01 NOTE — Progress Notes (Signed)
Mobility Specialist - Progress Note   12/01/21 0945  Mobility  Activity Ambulated independently in hallway  Level of Assistance Independent  Assistive Device None  Distance Ambulated (ft) 500 ft  Activity Response Tolerated well  Mobility Referral Yes  $Mobility charge 1 Mobility   Pt received in bed and agreeable to mobility. No complaints during mobility. Pt to EOB after session with all needs met & nurse in room.    [facility]

## 2021-12-01 NOTE — Consult Note (Signed)
Kayla Mcgrath Admit Date: 11/30/2021 12/01/2021 Kayla Mcgrath Requesting Physician:  Idamae Lusher MD  Reason for Consult:  AKI HPI:  42F recent diagnosis of uterine cancer who was being evaluated by medical oncology yesterday with identified AKI.  She originally presented with postmenopausal bleeding and had imaging findings initially showing an enlarged uterus and underwent biopsy on 11/2 identifying high-grade undifferentiated malignancy with extensive necrosis and follow-up MRI findings of likely metastatic lymphadenopathy.  Her creatinine yesterday was 3.4 at the time of her outpatient oncology visit and then she presented to the ED.  Most recent creatinine was in January of this year when it was 0.8.  No recent iodinated contrast.  CT renal stone study yesterday in the ED without evidence of hydronephrosis or hydroureter.  Urine yesterday with 11-20 erythrocytes/hpf and WBCs in clumps on microscopy.  Urine sodium was 81.  She has been using significant Aleve and ibuprofen at home.  Appetite has been off, fluid intake has been lessened.  There has been some weight loss.  She was admitted, received bolus LR and continued on LR at 100 mL/h.  Ceftriaxone given.  This morning her creatinine has improved to 2.98.  PMH Incudes: History of breast cancer Diverticular disease Hyperlipidemia  I/Os:  ROS NSAIDS: As above IV Contrast no exposure TMP/SMX no exposure Hypotension not present Balance of 12 systems is negative w/ exceptions as above  PMH  Past Medical History:  Diagnosis Date   Adjustment disorder with anxious mood 12/05/2018   Arthralgia 06/29/2015   Breast cancer ([place]) 2010   Colon polyps    ?   Complication of anesthesia    DCIS (ductal carcinoma in situ) of breast 2010   Diverticulosis    Full dentures    Hyperlipidemia    Joint pain    Leukocytosis 07/11/2017   PONV (postoperative nausea and vomiting)    Skin cancer    SCC   Wears glasses    PSH  Past Surgical  History:  Procedure Laterality Date   BREAST BIOPSY Right 05/11/2012   BREAST BIOPSY Right 04/24/2012   BREAST LUMPECTOMY Right 12/2008   cancer-tamoxifen   BREAST RECONSTRUCTION WITH PLACEMENT OF TISSUE EXPANDER AND FLEX HD (ACELLULAR HYDRATED DERMIS) Right 06/20/2012   Procedure: BREAST RECONSTRUCTION WITH PLACEMENT OF TISSUE EXPANDER AND FLEX HD (ACELLULAR HYDRATED DERMIS);  Surgeon: Theodoro Kos, DO;  Location: [place];  Service: Plastics;  Laterality: Right;   BREAST REDUCTION WITH MASTOPEXY Left 10/11/2012   Procedure: LEFT BREAST REDUCTION WITH MASTOPEXY;  Surgeon: Theodoro Kos, DO;  Location: [place];  Service: Plastics;  Laterality: Left;   cataract sugery Bilateral 03/2019   COLONOSCOPY  08/28/2013   [facility]   DILATION AND CURETTAGE OF UTERUS     heavy bleeding between two pregnancies   LIPOSUCTION WITH LIPOFILLING Left 10/11/2012   Procedure: LIPOSUCTION WITH LIPOFILLING;  Surgeon: Theodoro Kos, DO;  Location: [place];  Service: Plastics;  Laterality: Left;   MASTECTOMY Right 06/20/2012   MASTECTOMY W/ SENTINEL NODE BIOPSY Right 06/20/2012   Procedure: right skin sparing MASTECTOMY WITH SENTINEL LYMPH NODE BIOPSY;  Surgeon: Stark Klein, MD;  Location: [place];  Service: General;  Laterality: Right;   PARATHYROIDECTOMY Left 11/07/2017   Procedure: NECK EXPLORATION WITH LEFT THYROID LOBECTOMY;  Surgeon: [name], MD;  Location: [facility];  Service: General;  Laterality: Left;   POLYPECTOMY     REDUCTION MAMMAPLASTY Left 10/2012   REMOVAL OF TISSUE EXPANDER AND PLACEMENT  OF IMPLANT Right 10/11/2012   Procedure: REMOVAL OF TISSUE EXPANDER AND PLACEMENT OF SILICONE IMPLANT RIGHT;  Surgeon: Theodoro Kos, DO;  Location: [place];  Service: Plastics;  Laterality: Right;   FH  Family History  Problem Relation Age of Onset   Alcohol abuse Mother    Colon polyps Mother    Mental illness  Mother    Alcohol abuse Father        suicide   Mental illness Father    Cancer Sister        gyn cancer   Heart disease Paternal Grandfather    Colon cancer Neg Hx    Esophageal cancer Neg Hx    Rectal cancer Neg Hx    Stomach cancer Neg Hx    Breast cancer Neg Hx    Ovarian cancer Neg Hx    Endometrial cancer Neg Hx    Pancreatic cancer Neg Hx    Prostate cancer Neg Hx    SH  reports that she quit smoking about 40 years ago. Her smoking use included cigarettes. She has never used smokeless tobacco. She reports that she does not drink alcohol and does not use drugs. Allergies  Allergies  Allergen Reactions   Tylenol [Acetaminophen] Hives and Swelling    Lip swelling   Latex Rash   Home medications Prior to Admission medications   Medication Sig Start Date End Date Taking? Authorizing Provider  Cholecalciferol (VITAMIN D) 2000 UNITS CAPS Take 2,000 Units by mouth daily. Take one by mouth daily   Yes [provider]  escitalopram (LEXAPRO) 10 MG tablet Take 1 tablet (10 mg total) by mouth daily. 01/15/21  Yes Nche, Charlene Brooke, NP  famotidine (PEPCID) 20 MG tablet Take 20 mg by mouth 2 (two) times daily as needed for heartburn or indigestion.   Yes [provider]  ibuprofen (ADVIL) 200 MG tablet Take 600 mg by mouth 2 (two) times daily as needed for mild pain.   Yes [provider]  magnesium 30 MG tablet Take 30 mg by mouth daily.   Yes [provider]  OVER THE COUNTER MEDICATION Apply 1 application  topically as needed (knee pain). CBD Cream-   Yes [provider]  rosuvastatin (CRESTOR) 20 MG tablet CRESTOR '20MG'$  1X/WEEK X 1WEEK, THEN 2X/WEEK X 1WEEK, THEN 3X/WEEK CONTINUOUSLY. Patient taking differently: Take 20 mg by mouth See admin instructions. Take 20 mg on Monday, Wednesday, and Friday evening 10/13/21  Yes Nche, Charlene Brooke, NP  Turmeric 400 MG CAPS Take 800 mg by mouth daily.   Yes [provider]  vitamin B-12  (CYANOCOBALAMIN) 1000 MCG tablet Take 1,000 mcg by mouth daily at 2 PM. 10/19/20  Yes [provider]    Current Medications Scheduled Meds:  sodium chloride   Intravenous Once   escitalopram  10 mg Oral Daily   LORazepam       Continuous Infusions:  cefTRIAXone (ROCEPHIN)  IV     lactated ringers 100 mL/hr at 11/30/21 2328   PRN Meds:.LORazepam, LORazepam, melatonin  CBC Recent Labs  Lab 11/30/21 1500 11/30/21 1727 12/01/21 0551  WBC 16.6* 14.5* 11.3*  NEUTROABS 14.1* 12.2* 8.9*  HGB 9.6* 8.9* 7.3*  HCT 29.5* 28.4* 22.9*  MCV 90.5 92.8 92.0  PLT 395 350 202   Basic Metabolic Panel Recent Labs  Lab 11/30/21 1500 11/30/21 1727 12/01/21 0551  NA 138 139 141  K 4.6 4.0 3.9  CL 104 107 109  CO2 27 26 27  GLUCOSE 150* 123* 99  BUN 45* 45* 43*  CREATININE 3.18* 3.40* 2.98*  CALCIUM 9.4 8.7* 8.4*    Physical Exam  Blood pressure 130/71, pulse 93, temperature 98.2 F (36.8 C), temperature source Oral, resp. rate 18, height 5' (1.524 m), weight 66.4 kg, SpO2 98 %. GEN: NAD, lying in bed, conversant ENT: NCAT EYES: EOMI CV: Regular, normal S1 and S2 PULM: Clear bilaterally, normal work of breathing ABD: Soft, nontender, bowel sounds present SKIN: No rashes or lesions EXT: No peripheral edema NEURO: Nonfocal  Assessment 23F AKI with normal GFR at baseline in context of poor PO + NSAIDS + recent diagnosis of metastatic uterine cancer.  Imaging not consistent with obstruction.  Likely cause is hypovolemia + NSAIDs.  Likely ATN at this point.  AIN is possible with NSAID use as well.   AKI, DDx includes prerenal with secondary ATN, AIN also possible.  Improving with hydration thus far.  Need to stay off NSAIDs  Continue IV fluids at this time.  No RRT indications. Metastatic uterine cancer followed by medical oncology and Gyn ONC Anemia, for transfusion, per oncology  Plan Continue LR at 100 mL/h Daily weights, Daily Renal Panel, Strict I/Os, Avoid  nephrotoxins (NSAIDs, judicious IV Contrast)    Kayla Mcgrath  12/01/2021, 12:02 PM

## 2021-12-01 NOTE — TOC Progression Note (Signed)
Transition of Care [facility]) - Progression Note    Patient Details  Name: Kayla Mcgrath MRN: [id number] Date of Birth: 25-Jul-1945  Transition of Care [facility]) CM/SW [place], RN Phone [phone]  12/01/2021, 3:37 PM  Clinical Narrative:     Transition of Care [facility]) Screening Note   Patient Details  Name: Kayla Mcgrath Date of Birth: Oct 21, 1945   Transition of Care [facility]) CM/SW Contact:    Angelita Ingles, RN Phone Number: 12/01/2021, 3:38 PM    Transition of Care Department [facility]) has reviewed patient and no TOC needs have been identified at this time. We will continue to monitor patient advancement through interdisciplinary progression rounds. If new patient transition needs arise, please place a TOC consult.          Expected Discharge Plan and Services                                                 Social Determinants of Health (SDOH) Interventions    Readmission Risk Interventions     No data to display

## 2021-12-01 NOTE — Progress Notes (Signed)
Kayla Mcgrath   DOB:October 30, 1945   OZ#:[id number]    ASSESSMENT & PLAN:  Carcinosarcoma of uterus She was admitted due to findings of acute renal failure. CT without contrast confirmed multiple LN involvement. Repeat endometrial biopsy if pending. I will order CT chest WO contrast to complete staging and US liver to rule out liver mets due to elevated liver enzymes. She will likely benefit from neoadjuvant chemotherapy in the outpatient  Acute renal failure She admits not drinking enough fluids and has been taking ibuprofen extensively (600 mg BID for several months for pelvic pain). Continue IVF and nephrology consult  Multifactorial anemia Due to vaginal bleeding and renal failure We discussed some of the risks, benefits, and alternatives of blood transfusions. The patient is symptomatic from anemia and the hemoglobin level is critically low.  Some of the side-effects to be expected including risks of transfusion reactions, chills, infection, syndrome of volume overload and risk of hospitalization from various reasons and the patient is willing to proceed and went ahead to sign consent today. She will receive 1 unit of blood  Elevated liver enzymes Either from fatty liver or metastatic disease. Due to renal failure, will avoid contrast and will investigate with [place]  Protein calorie malnutrition Recommend dietitian consult  Discharge planning Hopefully next 24-48 hours   All questions were answered. The patient knows to call the clinic with any problems, questions or concerns.   The total time spent in the appointment was 60 minutes encounter with patients including review of chart and various tests results, discussions about plan of care and coordination of care plan  Heath Lark, MD 12/01/2021 8:55 AM  Subjective:  She was admitted due to new onset ARF. With aggressive IVF, creatinine improves a bit but she has now severe anemia. She has ongoing pelvic bleeding  Objective:  Vitals:    12/01/21 0250 12/01/21 0640  BP: (!) 111/57 117/61  Pulse: 80 87  Resp: 17 17  Temp: 98.2 F (36.8 C) 99.2 F (37.3 C)  SpO2: 96% 96%     Intake/Output Summary (Last 24 hours) at 12/01/2021 0855 Last data filed at 12/01/2021 0300 Gross per 24 hour  Intake 1475.08 ml  Output --  Net 1475.08 ml    GENERAL:alert, no distress and comfortable NEURO: alert & oriented x 3 with fluent speech, no focal motor/sensory deficits   Labs:  Recent Labs    11/30/21 1500 11/30/21 1727 12/01/21 0551  NA 138 139 141  K 4.6 4.0 3.9  CL 104 107 109  CO2 '27 26 27  '$ GLUCOSE 150* 123* 99  BUN 45* 45* 43*  CREATININE 3.18* 3.40* 2.98*  CALCIUM 9.4 8.7* 8.4*  GFRNONAA 15* 14* 16*  PROT 7.5 7.2 5.8*  ALBUMIN 3.2* 2.4* 2.0*  AST 74* 72* 53*  ALT 69* 69* 57*  ALKPHOS 142* 129* 100  BILITOT 0.4 0.7 0.4    Studies: I have reviewed CT imaging CT Renal Stone Study  Result Date: 11/30/2021 CLINICAL DATA:  Uterine carcinosarcoma.  Abdominal pain. * Tracking Code: BO * EXAM: CT ABDOMEN AND PELVIS WITHOUT CONTRAST TECHNIQUE: Multidetector CT imaging of the abdomen and pelvis was performed following the standard protocol without IV contrast. RADIATION DOSE REDUCTION: This exam was performed according to the departmental dose-optimization program which includes automated exposure control, adjustment of the mA and/or kV according to patient size and/or use of iterative reconstruction technique. COMPARISON:  Pelvic MRI 11/25/2021 FINDINGS: Lower chest: Linear subsegmental atelectasis or scarring in the posterior basal segment  right lower lobe. Lower margin of a right breast implant is visible. Hepatobiliary: Faint dependent density in the gallbladder could represent sludge or small gallstones. Noncontrast CT appearance of the liver otherwise unremarkable. Pancreas: Unremarkable Spleen: Unremarkable Adrenals/Urinary Tract: The adrenal glands appear unremarkable. Currently there is no hydronephrosis and no  hydroureter. Urinary bladder unremarkable. No urinary tract calculus observed. Stomach/Bowel: Descending and sigmoid colon diverticulosis without active diverticulitis. Scattered diverticula in the remainder of the colon including the transverse colon. Normal appendix. No dilated bowel. Vascular/Lymphatic: Mild abdominal aortic atherosclerotic vascular calcification. Pathologic retroperitoneal and bilateral pelvic adenopathy. Index left periaortic node 2.3 cm in short axis on image 30 series 2. Index right common iliac node 1.6 cm in short axis on image 48 series 2. Index right external iliac node 2.2 cm in short axis on image 66 series 2. Index left external iliac node 3.0 cm in short axis on image 59 series 2. Substantial superior rectal and perirectal adenopathy noted with a left perirectal lymph node measuring 1.8 cm in short axis on image 66 series 2. Reproductive: Large central uterine mass better depicted on the recent MRI, but measuring about 5.6 cm anterior-posterior on image 110 series 6. Miniscule locules of gas along this mass, possibly biopsy related. Other: No ascites observed. Musculoskeletal: Mild dextroconvex lumbar scoliosis with rotary component. IMPRESSION: 1. Pathologic retroperitoneal and bilateral pelvic adenopathy compatible with metastatic disease. 2. Large central uterine mass better depicted on recent MRI, compatible with malignancy. 3. No current hydronephrosis or hydroureter. 4. Colonic diverticulosis without active diverticulitis. 5. Faint dependent density in the gallbladder could represent sludge or small gallstones. 6. Mild dextroconvex lumbar scoliosis with rotary component. 7. Aortic atherosclerosis. Aortic Atherosclerosis (ICD10-I70.0). Electronically Signed   By: Van Clines M.D.   On: 11/30/2021 19:05   MR PELVIS W WO CONTRAST  Result Date: 11/25/2021 CLINICAL DATA:  Vaginal bleeding for 1 month.  Pelvic mass. EXAM: MRI PELVIS WITHOUT AND WITH CONTRAST TECHNIQUE:  Multiplanar multisequence MR imaging of the pelvis was performed both before and after administration of intravenous contrast. CONTRAST:  6.64m GADAVIST GADOBUTROL 1 MMOL/ML IV SOLN COMPARISON:  None Available. FINDINGS: Lower Urinary Tract: No urinary bladder or urethral abnormality identified. Bowel: Sigmoid diverticulosis is noted, without evidence of diverticulitis. Vascular/Lymphatic: Lymphadenopathy is seen throughout the visualized lower abdominal retroperitoneum, bilateral iliac chains, in sigmoid mesocolon. Largest index lymph node is seen in the left external iliac chain measuring 2.6 cm short axis on image 15/3. This is consistent with metastatic disease. Reproductive: -- Uterus: Measures 16.3 x 8.4 by 11.8 cm (volume = 850 cm^3). No uterine fibroids identified. Marked distention of the entire endometrial cavity is seen by heterogeneously enhancing soft tissue density. This extends through the endocervical now in into the lower vagina. Double-layer thickness measures 71 mm. Diffuse myometrial thinning is seen due to marked distention of the endometrial cavity. This limits evaluation, however there is severe myometrial thinning seen in the fundus; deep myometrial invasion cannot be excluded. No evidence of extra uterine extension of tumor. -- Right ovary: Not visualized. -- Left ovary:  Not visualized. Other: No peritoneal thickening or abnormal free fluid. Musculoskeletal:  Unremarkable. IMPRESSION: Marked distention of the entire endometrial cavity by heterogeneously enhancing soft tissue density, which extends through the endocervical canal into the lower vagina. This is highly suspicious for endometrial carcinoma. Diffuse myometrial thinning due to marked distention of endometrial cavity limits evaluation; deep myometrial invasion cannot be excluded in the uterine fundus. No evidence of extra-uterine extension of tumor. Lymphadenopathy  in lower abdominal retroperitoneum, bilateral iliac chains, and  sigmoid mesocolon, consistent with metastatic disease. Sigmoid diverticulosis. No radiographic evidence of diverticulitis. Electronically Signed   By: Marlaine Hind M.D.   On: 11/25/2021 15:42

## 2021-12-01 NOTE — Progress Notes (Signed)
PROGRESS NOTE Kayla Mcgrath  ZDG:[id number] DOB: 1945/09/09 DOA: 11/30/2021 PCP: Lafonda Mosses, MD   Brief Narrative/Hospital Course: 76 year old female with recently diagnosed endometrial carcinoma, GAD, HLD, recent decline in appetite seen by GYN oncology Dr Berline Lopes as outpatient with plan for CT chest abdomen pelvis with contrast for staging along with port Placement in anticipation of chemotherapy, noted to have elevated creatinine of 3.4 from recent baseline 0.16 January 2021 so referred to the ED In the ED BP 120s/50s, afebrile. Labs with AKI transaminitis leukocytosis 14.5 anemia 8.9 CT renal study: Pathology retroperitoneal and bilateral pelvic adenopathy compatible with metastatic disease, large central uterine mass, no hydronephrosis/hydroureter, colonic diverticulosis noted sludge in the gallbladder noted.  Oncology consulted and patient admitted for further management.    Subjective: Seen examined Resting comfortably no nausea vomiting abdominal pain. No uop charted-added intake output monitoring ordered Labs reviewed shows improving creatinine, stable potassium and bicarb  Assessment and Plan: Principal Problem:   AKI (acute kidney injury) ([facility]) Active Problems:   HLD (hyperlipidemia)   Severe sepsis (HCC)   Acute cystitis   Transaminitis   Normocytic anemia   Endometrial carcinoma (HCC)   GAD (generalized anxiety disorder)  Acute kidney injury:Creat peaked 3.4, likely from poor oral intake in the setting of carcinoma, no hydronephrosis on CT imaging.  Improvement IV fluids continue the same, avoid nephrotoxic medication, monitor output.  Dr. Joelyn Oms consulted per oncology recommended Recent Labs    01/15/21 1113 11/30/21 1500 11/30/21 1727 12/01/21 0551  BUN 20 45* 45* 43*  CREATININE 0.78 3.18* 3.40* 2.98*    Acute cystitis WBC >50, LE mod, Nitrite Neg. SIRS/Leukocytosis No fever, wbc up 14k on admit>lactic acid normal.  Placed on ceftriaxone follow-up  culture report.  Doubt sepsis  Transaminitis: Mildly abnormal downtrending, monitor. RUQ [place] ordered on admission Recent Labs  Lab 11/30/21 1500 11/30/21 1727 11/30/21 2321 12/01/21 0551  AST 74* 72*  --  53*  ALT 69* 69*  --  57*  ALKPHOS 142* 129*  --  100  BILITOT 0.4 0.7  --  0.4  PROT 7.5 7.2  --  5.8*  ALBUMIN 3.2* 2.4*  --  2.0*  INR  --   --  1.3* 1.3*     Normocytic Anemia: folate, b12, anemia panel as below. Trend hb Recent Labs    10/22/21 0906 11/30/21 1500 11/30/21 1727 11/30/21 2321 12/01/21 0551  HGB 11.2* 9.6* 8.9*  --  7.3*  MCV 91.3 90.5 92.8  --  92.0  VITAMINB12  --   --   --  597  --   FOLATE  --   --   --  6.4  --   FERRITIN 159  --   --  418*  --   TIBC 293  --   --  155*  --   IRON 87  --   --  30  --   RETICCTPCT  --   --   --  0.8  --     GAD: Mood is stable continue her home meds  Endometrial carcinoma:seen by GYN oncology Dr Berline Lopes 11/21 as outpatient with plan for CT chest abdomen pelvis with contrast for staging along with port Placement in anticipation of chemotherapy> on hold until AKI resolves.  Discussed with Dr. Lottie Rater which  DVT prophylaxis: SCDs Start: 11/30/21 2010 Code Status:   Code Status: Full Code Family Communication: plan of care discussed with patient at bedside. Patient status is: Inpatient because of AKI Level of care:  Telemetry   Dispo: The patient is from: home, lives with her husband            Anticipated disposition: home 2 days  Objective: Vitals last 24 hrs: Vitals:   11/30/21 2301 12/01/21 0250 12/01/21 0400 12/01/21 0640  BP: 134/68 (!) 111/57  117/61  Pulse: 86 80  87  Resp: '17 17  17  '$ Temp: 99.2 F (37.3 C) 98.2 F (36.8 C)  99.2 F (37.3 C)  TempSrc: Oral Oral  Oral  SpO2: 98% 96%  96%  Weight:   66.4 kg   Height:       Weight change:   Physical Examination: General exam: alert awake, older than stated age HEENT:Oral mucosa moist, Ear/Nose WNL grossly Respiratory system: bilaterally clear   BS, no use of accessory muscle Cardiovascular system: S1 & S2 +, No JVD. Gastrointestinal system: Abdomen soft,NT,ND, BS+ Nervous System:Alert, awake, moving extremities. Extremities: LE edema neg,distal peripheral pulses palpable.  Skin: No rashes,no icterus. MSK: Normal muscle bulk,tone, power  Medications reviewed:  Scheduled Meds:  sodium chloride   Intravenous Once   diphenhydrAMINE  25 mg Oral Once   escitalopram  10 mg Oral Daily   LORazepam       Continuous Infusions:  cefTRIAXone (ROCEPHIN)  IV     lactated ringers 100 mL/hr at 11/30/21 2328    Diet Order             Diet regular Room service appropriate? Yes; Fluid consistency: Thin  Diet effective now                  Intake/Output Summary (Last 24 hours) at 12/01/2021 1007 Last data filed at 12/01/2021 0300 Gross per 24 hour  Intake 1475.08 ml  Output --  Net 1475.08 ml   Net IO Since Admission: 1,475.08 mL [12/01/21 1007]  Wt Readings from Last 3 Encounters:  12/01/21 66.4 kg  11/30/21 66.3 kg  11/30/21 66.4 kg     Unresulted Labs (From admission, onward)     Start     Ordered   12/02/21 0500  CBC with Differential/Platelet  Tomorrow morning,   R       Question:  Specimen collection method  Answer:  Lab=Lab collect   12/01/21 0921   12/02/21 7124  Basic metabolic panel  Tomorrow morning,   R       Question:  Specimen collection method  Answer:  Lab=Lab collect   12/01/21 0921   12/01/21 0920  Prepare RBC (crossmatch)  (Blood Administration Adult)  Once,   R       Question Answer Comment  # of Units 1 unit   Transfusion Indications Hemoglobin 8 gm/dL or less and orthopedic or cardiac surgery or pre-existing cardiac condition   Number of Units to Keep Ahead NO units ahead   Instructions: Transfuse   If emergent release call blood bank [facility] [phone]      12/01/21 0920   11/30/21 2032  Urine Culture  (Urine Culture)  Add-on,   AD       Question:  Indication  Answer:  Dysuria    11/30/21 2031          Data Reviewed: I have personally reviewed following labs and imaging studies CBC: Recent Labs  Lab 11/30/21 1500 11/30/21 1727 12/01/21 0551  WBC 16.6* 14.5* 11.3*  NEUTROABS 14.1* 12.2* 8.9*  HGB 9.6* 8.9* 7.3*  HCT 29.5* 28.4* 22.9*  MCV 90.5 92.8 92.0  PLT 395 350 302  Basic Metabolic Panel: Recent Labs  Lab 11/30/21 1500 11/30/21 1727 12/01/21 0551  NA 138 139 141  K 4.6 4.0 3.9  CL 104 107 109  CO2 '27 26 27  '$ GLUCOSE 150* 123* 99  BUN 45* 45* 43*  CREATININE 3.18* 3.40* 2.98*  CALCIUM 9.4 8.7* 8.4*  MG  --  1.9 1.8   GFR: Estimated Creatinine Clearance: 14.1 mL/min (A) (by C-G formula based on SCr of 2.98 mg/dL (H)). Liver Function Tests: Recent Labs  Lab 11/30/21 1500 11/30/21 1727 12/01/21 0551  AST 74* 72* 53*  ALT 69* 69* 57*  ALKPHOS 142* 129* 100  BILITOT 0.4 0.7 0.4  PROT 7.5 7.2 5.8*  ALBUMIN 3.2* 2.4* 2.0*  No results found for this or any previous visit (from the past 240 hour(s)).  Antimicrobials: Anti-infectives (From admission, onward)    Start     Dose/Rate Route Frequency Ordered Stop   12/01/21 2000  cefTRIAXone (ROCEPHIN) 1 g in sodium chloride 0.9 % 100 mL IVPB        1 g 200 mL/hr over 30 Minutes Intravenous Every 24 hours 11/30/21 2010     11/30/21 1915  cefTRIAXone (ROCEPHIN) 2 g in sodium chloride 0.9 % 100 mL IVPB        2 g 200 mL/hr over 30 Minutes Intravenous  Once 11/30/21 1912 11/30/21 2114      Culture/Microbiology No results found for: "SDES", "SPECREQUEST", "CULT", "REPTSTATUS"  Radiology Studies: CT Renal Stone Study  Result Date: 11/30/2021 CLINICAL DATA:  Uterine carcinosarcoma.  Abdominal pain. * Tracking Code: BO * EXAM: CT ABDOMEN AND PELVIS WITHOUT CONTRAST TECHNIQUE: Multidetector CT imaging of the abdomen and pelvis was performed following the standard protocol without IV contrast. RADIATION DOSE REDUCTION: This exam was performed according to the departmental dose-optimization  program which includes automated exposure control, adjustment of the mA and/or kV according to patient size and/or use of iterative reconstruction technique. COMPARISON:  Pelvic MRI 11/25/2021 FINDINGS: Lower chest: Linear subsegmental atelectasis or scarring in the posterior basal segment right lower lobe. Lower margin of a right breast implant is visible. Hepatobiliary: Faint dependent density in the gallbladder could represent sludge or small gallstones. Noncontrast CT appearance of the liver otherwise unremarkable. Pancreas: Unremarkable Spleen: Unremarkable Adrenals/Urinary Tract: The adrenal glands appear unremarkable. Currently there is no hydronephrosis and no hydroureter. Urinary bladder unremarkable. No urinary tract calculus observed. Stomach/Bowel: Descending and sigmoid colon diverticulosis without active diverticulitis. Scattered diverticula in the remainder of the colon including the transverse colon. Normal appendix. No dilated bowel. Vascular/Lymphatic: Mild abdominal aortic atherosclerotic vascular calcification. Pathologic retroperitoneal and bilateral pelvic adenopathy. Index left periaortic node 2.3 cm in short axis on image 30 series 2. Index right common iliac node 1.6 cm in short axis on image 48 series 2. Index right external iliac node 2.2 cm in short axis on image 66 series 2. Index left external iliac node 3.0 cm in short axis on image 59 series 2. Substantial superior rectal and perirectal adenopathy noted with a left perirectal lymph node measuring 1.8 cm in short axis on image 66 series 2. Reproductive: Large central uterine mass better depicted on the recent MRI, but measuring about 5.6 cm anterior-posterior on image 110 series 6. Miniscule locules of gas along this mass, possibly biopsy related. Other: No ascites observed. Musculoskeletal: Mild dextroconvex lumbar scoliosis with rotary component. IMPRESSION: 1. Pathologic retroperitoneal and bilateral pelvic adenopathy compatible  with metastatic disease. 2. Large central uterine mass better depicted on recent MRI, compatible with  malignancy. 3. No current hydronephrosis or hydroureter. 4. Colonic diverticulosis without active diverticulitis. 5. Faint dependent density in the gallbladder could represent sludge or small gallstones. 6. Mild dextroconvex lumbar scoliosis with rotary component. 7. Aortic atherosclerosis. Aortic Atherosclerosis (ICD10-I70.0). Electronically Signed   By: Van Clines M.D.   On: 11/30/2021 19:05     LOS: 1 day   Antonieta Pert, MD [facility]  12/01/2021, 10:07 AM

## 2021-12-01 NOTE — Progress Notes (Signed)
Gynecologic Oncology Progress Note  Kayla Mcgrath is a 76 year old female currently admitted due to acute kidney injury (creatinine 3.18) noted on office labs obtained 11/30/21. In the ER, a CT renal stone study was performed resulting 1. Pathologic retroperitoneal and bilateral pelvic adenopathy compatible with metastatic disease. 2. Large central uterine mass better depicted on recent MRI, compatible with malignancy. 3. No current hydronephrosis or hydroureter. 4. Colonic diverticulosis without active diverticulitis. 5. Faint dependent density in the gallbladder could represent sludge or small gallstones. 6. Mild dextroconvex lumbar scoliosis with rotary component. 7. Aortic atherosclerosis. She was started on IV antibiotics given UA results and leukocytosis and IV hydration.  She was seen at the [facility] on 11/30/21 by Dr. Heath Lark, Medical Oncologist, and Dr. Jeral Pinch, GYN oncologist, for a significantly enlarged uterus and biopsy-proven uterine cancer, suspected either to be uterine carcinosarcoma versus sarcoma with concerns for metastatic disease on recent imaging.  Subjective: Patient reports feeling well this am. States she slept well and she is congested this am, which is not abnormal for her. Denies nausea or emesis. Tolerating a [place] sandwich last pm. Had void this am. Denies dizziness/lightheadedness unless she gets up out of bed too fast. The vaginal bleeding has decreased slightly and described as brown in color. No pain reported. No concerns voiced.   Objective: Vital signs in last 24 hours: Temp:  [98.1 F (36.7 C)-99.2 F (37.3 C)] 99.2 F (37.3 C) (11/22 0640) Pulse Rate:  [80-103] 87 (11/22 0640) Resp:  [17-18] 17 (11/22 0640) BP: (111-144)/(53-70) 117/61 (11/22 0640) SpO2:  [96 %-100 %] 96 % (11/22 0640) Weight:  [146 lb 3.2 oz (66.3 kg)-146 lb 6.2 oz (66.4 kg)] 146 lb 6.2 oz (66.4 kg) (11/22 0400) Last BM Date : 11/30/21  Intake/Output from previous  day: 11/21 0701 - 11/22 0700 In: 1475.1 [I.V.:375.1; IV Piggyback:1100] Out: -   Physical Examination (performed by Dr. Berline Lopes): General: alert, cooperative, and no distress Resp: clear to auscultation bilaterally Cardio: regular rate and rhythm, S1, S2 normal, no murmur, click, rub or gallop GI: soft, non-tender; bowel sounds normal; no masses,  no organomegaly Extremities: extremities normal, atraumatic, no cyanosis or edema  Labs: WBC/Hgb/Hct/Plts:  11.3/7.3/22.9/302 (11/22 4098) BUN/Cr/glu/ALT/AST/amyl/lip:  43/2.98/--/57/53/--/-- (11/22 1191)  Assessment: 76 y.o. postmenopausal patient with significantly enlarged uterus and biopsy-proven uterine cancer, suspected either to be uterine carcinosarcoma versus sarcoma currently admitted after noting acute kidney injury on labs from [facility] outpatient appointment. She is currently being hydrated with creatinine at 2.98 this am, being treated with IV antibiotics for presumed urinary tract infection given UA results and WBC count.   Heme: Hgb 7.3 and Hct 22.9. Given continued vaginal bleeding, recommendation for blood transfusion.  ID: WBC 11.3 this am. Currently on Rocephin IV. Urine culture pending.   CV: BP and HR stable. Continue to monitor with routine vital signs.  GI:  Tolerating po: Yes.   GU: Creatinine 2.98 this am. Per night shift RN, pt was bladder scanned periodically and voided this am.     FEN: No critical values on am labs. AST/ALT elevated.  Prophylaxis: SCDs ordered.  Plan: Dr. Berline Lopes is recommending continued hydration with goal for creatinine improvement, continue with treatment of presumed urinary tract infection, blood transfusion given persistent uterine bleeding with current H&H and need for better blood levels in order to start treatment as well. Awaiting biopsy results from clinic visit on 11/30/21. Given nutritional status, ideally she would begin neoadjuvant chemotherapy to allow for shrinkage of  disease and time to improve her nutritional status.     LOS: 1 day    Samyah Bilbo D Santhiago Collingsworth 12/01/2021, 8:30 AM

## 2021-12-02 ENCOUNTER — Other Ambulatory Visit: Payer: Self-pay | Admitting: Hematology and Oncology

## 2021-12-02 DIAGNOSIS — C549 Malignant neoplasm of corpus uteri, unspecified: Secondary | ICD-10-CM

## 2021-12-02 DIAGNOSIS — N179 Acute kidney failure, unspecified: Secondary | ICD-10-CM | POA: Diagnosis not present

## 2021-12-02 LAB — COMPREHENSIVE METABOLIC PANEL
ALT: 55 U/L — ABNORMAL HIGH (ref 0–44)
AST: 50 U/L — ABNORMAL HIGH (ref 15–41)
Albumin: 2 g/dL — ABNORMAL LOW (ref 3.5–5.0)
Alkaline Phosphatase: 91 U/L (ref 38–126)
Anion gap: 8 (ref 5–15)
BUN: 40 mg/dL — ABNORMAL HIGH (ref 8–23)
CO2: 24 mmol/L (ref 22–32)
Calcium: 8.4 mg/dL — ABNORMAL LOW (ref 8.9–10.3)
Chloride: 109 mmol/L (ref 98–111)
Creatinine, Ser: 2.63 mg/dL — ABNORMAL HIGH (ref 0.44–1.00)
GFR, Estimated: 19 mL/min — ABNORMAL LOW (ref >60)
Glucose, Bld: 91 mg/dL (ref 70–99)
Potassium: 4.1 mmol/L (ref 3.5–5.1)
Sodium: 141 mmol/L (ref 135–145)
Total Bilirubin: 0.5 mg/dL (ref 0.3–1.2)
Total Protein: 6 g/dL — ABNORMAL LOW (ref 6.5–8.1)

## 2021-12-02 LAB — TYPE AND SCREEN
ABO/RH(D): O POS
Antibody Screen: NEGATIVE
Unit division: 0

## 2021-12-02 LAB — CBC WITH DIFFERENTIAL/PLATELET
Abs Immature Granulocytes: 0.07 10*3/uL (ref 0.00–0.07)
Basophils Absolute: 0.1 10*3/uL (ref 0.0–0.1)
Basophils Relative: 1 %
Eosinophils Absolute: 0.2 10*3/uL (ref 0.0–0.5)
Eosinophils Relative: 2 %
HCT: 27.7 % — ABNORMAL LOW (ref 36.0–46.0)
Hemoglobin: 8.9 g/dL — ABNORMAL LOW (ref 12.0–15.0)
Immature Granulocytes: 1 %
Lymphocytes Relative: 15 %
Lymphs Abs: 1.5 10*3/uL (ref 0.7–4.0)
MCH: 29.6 pg (ref 26.0–34.0)
MCHC: 32.1 g/dL (ref 30.0–36.0)
MCV: 92 fL (ref 80.0–100.0)
Monocytes Absolute: 0.7 10*3/uL (ref 0.1–1.0)
Monocytes Relative: 8 %
Neutro Abs: 7.3 10*3/uL (ref 1.7–7.7)
Neutrophils Relative %: 73 %
Platelets: 287 10*3/uL (ref 150–400)
RBC: 3.01 MIL/uL — ABNORMAL LOW (ref 3.87–5.11)
RDW: 15.5 % (ref 11.5–15.5)
WBC: 9.8 10*3/uL (ref 4.0–10.5)
nRBC: 0 % (ref 0.0–0.2)

## 2021-12-02 LAB — BPAM RBC
Blood Product Expiration Date: 202312222359
ISSUE DATE / TIME: 202311221130
Unit Type and Rh: 5100

## 2021-12-02 MED ORDER — CEFDINIR 300 MG PO CAPS: 300.0000 mg | ORAL_CAPSULE | Freq: Two times a day (BID) | ORAL | 0 refills | Status: AC

## 2021-12-02 NOTE — Progress Notes (Signed)
Kayla Mcgrath   DOB:10-22-1945   ZO#:[id number]    ASSESSMENT & PLAN:  Carcinosarcoma of uterus She was admitted due to findings of acute renal failure. CT without contrast confirmed multiple LN involvement. Repeat endometrial biopsy if pending.  CT imaging of the chest showed no evidence of metastatic disease to her lungs Ultrasound of her liver did not reveal any evidence of liver metastasis Assuming repeat endometrial biopsy confirmed carcinosarcoma, the plan will be to move forward with chemotherapy She is scheduled for port placement on December 5 I will get her started after port placement is completed  Acute renal failure, resolving She admits not drinking enough fluids and has been taking ibuprofen extensively (600 mg BID for several months for pelvic pain).  Appreciate nephrology consult Recommend oral fluid intake upon discharge and avoid NSAID in the future   Multifactorial anemia Due to vaginal bleeding and renal failure Her blood count has improved after blood transfusion I will monitor closely in the outpatient setting  Elevated liver enzymes Could be related to fatty liver disease Observe only   Protein calorie malnutrition Recommend high-protein intake   Discharge planning She can be discharged from my perspective.  I will arrange outpatient follow-up.  I will sign off   All questions were answered. The patient knows to call the clinic with any problems, questions or concerns.   The total time spent in the appointment was 40 minutes encounter with patients including review of chart and various tests results, discussions about plan of care and coordination of care plan  Heath Lark, MD 12/02/2021 8:23 AM  Subjective:  She felt better this morning.  She is able to hydrate adequately.  Objective:  Vitals:   12/01/21 2144 12/02/21 0512  BP: (!) 103/59 (!) 111/53  Pulse: 70 62  Resp: 17 17  Temp: 99 F (37.2 C) 98.6 F (37 C)  SpO2: 96% 93%      Intake/Output Summary (Last 24 hours) at 12/02/2021 0823 Last data filed at 12/02/2021 9811 Gross per 24 hour  Intake 2521.99 ml  Output 1150 ml  Net 1371.99 ml    GENERAL:alert, no distress and comfortable  NEURO: alert & oriented x 3 with fluent speech, no focal motor/sensory deficits   Labs:  Recent Labs    11/30/21 1727 12/01/21 0551 12/02/21 0725  NA 139 141 141  K 4.0 3.9 4.1  CL 107 109 109  CO2 '26 27 24  '$ GLUCOSE 123* 99 91  BUN 45* 43* 40*  CREATININE 3.40* 2.98* 2.63*  CALCIUM 8.7* 8.4* 8.4*  GFRNONAA 14* 16* 19*  PROT 7.2 5.8* 6.0*  ALBUMIN 2.4* 2.0* 2.0*  AST 72* 53* 50*  ALT 69* 57* 55*  ALKPHOS 129* 100 91  BILITOT 0.7 0.4 0.5    Studies: I have personally reviewed her CT imaging CT CHEST WO CONTRAST  Result Date: 12/01/2021 CLINICAL DATA:  Cervical cancer staging.  * Tracking Code: BO * EXAM: CT CHEST WITHOUT CONTRAST TECHNIQUE: Multidetector CT imaging of the chest was performed following the standard protocol without IV contrast. RADIATION DOSE REDUCTION: This exam was performed according to the departmental dose-optimization program which includes automated exposure control, adjustment of the mA and/or kV according to patient size and/or use of iterative reconstruction technique. COMPARISON:  Abdominal CT 11/30/2021, pelvic MRI 11/25/2021 and chest radiographs 10/30/2017. FINDINGS: Cardiovascular: Mild atherosclerosis of the aorta, great vessels and coronary arteries. No acute vascular findings on noncontrast imaging. The heart size is normal. There is no pericardial effusion.  Mediastinum/Nodes: There are no enlarged mediastinal, hilar or axillary lymph nodes.Hilar assessment is limited by the lack of intravenous contrast. There are surgical clips in the right axilla. Evidence of previous left thyroid lobectomy. Small hiatal hernia. Lungs/Pleura: No pleural effusion or pneumothorax. Mild linear atelectasis or scarring at both lung bases. The lungs are  otherwise clear, without suspicious pulmonary nodules. Upper abdomen: Previously demonstrated retroperitoneal a tendinopathy is partially visualized, grossly stable. No other significant findings within the visualized upper abdomen. Musculoskeletal/Chest wall: There is no chest wall mass or suspicious osseous finding. Status post right mastectomy and axillary node dissection. There is a right breast implant. There are degenerative changes in the spine associated with a thoracolumbar scoliosis. IMPRESSION: 1. No evidence of metastatic disease within the chest. 2. No acute chest findings. 3.  Aortic Atherosclerosis (ICD10-I70.0). Electronically Signed   By: Richardean Sale M.D.   On: 12/01/2021 16:39   US Abdomen Limited RUQ (LIVER/GB)  Result Date: 12/01/2021 CLINICAL DATA:  Abnormal LFTs. EXAM: ULTRASOUND ABDOMEN LIMITED RIGHT UPPER QUADRANT COMPARISON:  CT abdomen and pelvis 11/30/2021 FINDINGS: Gallbladder: There are multiple echogenic shadowing mobile gallstones. No gallbladder wall thickening. No pericholecystic fluid. Negative sonographic Murphy's sign. Common bile duct: Diameter: 5 mm, within normal limits. No intrahepatic or extrahepatic biliary ductal dilatation. Liver: No focal lesion identified. Smooth liver contours. Portal vein is patent on color Doppler imaging with normal direction of blood flow towards the liver. Other: None. IMPRESSION: 1. Cholelithiasis. No evidence of acute cholecystitis. 2. No biliary dilatation. Electronically Signed   By: Yvonne Kendall M.D.   On: 12/01/2021 16:39   CT Renal Stone Study  Result Date: 11/30/2021 CLINICAL DATA:  Uterine carcinosarcoma.  Abdominal pain. * Tracking Code: BO * EXAM: CT ABDOMEN AND PELVIS WITHOUT CONTRAST TECHNIQUE: Multidetector CT imaging of the abdomen and pelvis was performed following the standard protocol without IV contrast. RADIATION DOSE REDUCTION: This exam was performed according to the departmental dose-optimization program which  includes automated exposure control, adjustment of the mA and/or kV according to patient size and/or use of iterative reconstruction technique. COMPARISON:  Pelvic MRI 11/25/2021 FINDINGS: Lower chest: Linear subsegmental atelectasis or scarring in the posterior basal segment right lower lobe. Lower margin of a right breast implant is visible. Hepatobiliary: Faint dependent density in the gallbladder could represent sludge or small gallstones. Noncontrast CT appearance of the liver otherwise unremarkable. Pancreas: Unremarkable Spleen: Unremarkable Adrenals/Urinary Tract: The adrenal glands appear unremarkable. Currently there is no hydronephrosis and no hydroureter. Urinary bladder unremarkable. No urinary tract calculus observed. Stomach/Bowel: Descending and sigmoid colon diverticulosis without active diverticulitis. Scattered diverticula in the remainder of the colon including the transverse colon. Normal appendix. No dilated bowel. Vascular/Lymphatic: Mild abdominal aortic atherosclerotic vascular calcification. Pathologic retroperitoneal and bilateral pelvic adenopathy. Index left periaortic node 2.3 cm in short axis on image 30 series 2. Index right common iliac node 1.6 cm in short axis on image 48 series 2. Index right external iliac node 2.2 cm in short axis on image 66 series 2. Index left external iliac node 3.0 cm in short axis on image 59 series 2. Substantial superior rectal and perirectal adenopathy noted with a left perirectal lymph node measuring 1.8 cm in short axis on image 66 series 2. Reproductive: Large central uterine mass better depicted on the recent MRI, but measuring about 5.6 cm anterior-posterior on image 110 series 6. Miniscule locules of gas along this mass, possibly biopsy related. Other: No ascites observed. Musculoskeletal: Mild dextroconvex lumbar scoliosis with rotary  component. IMPRESSION: 1. Pathologic retroperitoneal and bilateral pelvic adenopathy compatible with metastatic  disease. 2. Large central uterine mass better depicted on recent MRI, compatible with malignancy. 3. No current hydronephrosis or hydroureter. 4. Colonic diverticulosis without active diverticulitis. 5. Faint dependent density in the gallbladder could represent sludge or small gallstones. 6. Mild dextroconvex lumbar scoliosis with rotary component. 7. Aortic atherosclerosis. Aortic Atherosclerosis (ICD10-I70.0). Electronically Signed   By: Van Clines M.D.   On: 11/30/2021 19:05   MR PELVIS W WO CONTRAST  Result Date: 11/25/2021 CLINICAL DATA:  Vaginal bleeding for 1 month.  Pelvic mass. EXAM: MRI PELVIS WITHOUT AND WITH CONTRAST TECHNIQUE: Multiplanar multisequence MR imaging of the pelvis was performed both before and after administration of intravenous contrast. CONTRAST:  6.43m GADAVIST GADOBUTROL 1 MMOL/ML IV SOLN COMPARISON:  None Available. FINDINGS: Lower Urinary Tract: No urinary bladder or urethral abnormality identified. Bowel: Sigmoid diverticulosis is noted, without evidence of diverticulitis. Vascular/Lymphatic: Lymphadenopathy is seen throughout the visualized lower abdominal retroperitoneum, bilateral iliac chains, in sigmoid mesocolon. Largest index lymph node is seen in the left external iliac chain measuring 2.6 cm short axis on image 15/3. This is consistent with metastatic disease. Reproductive: -- Uterus: Measures 16.3 x 8.4 by 11.8 cm (volume = 850 cm^3). No uterine fibroids identified. Marked distention of the entire endometrial cavity is seen by heterogeneously enhancing soft tissue density. This extends through the endocervical now in into the lower vagina. Double-layer thickness measures 71 mm. Diffuse myometrial thinning is seen due to marked distention of the endometrial cavity. This limits evaluation, however there is severe myometrial thinning seen in the fundus; deep myometrial invasion cannot be excluded. No evidence of extra uterine extension of tumor. -- Right ovary:  Not visualized. -- Left ovary:  Not visualized. Other: No peritoneal thickening or abnormal free fluid. Musculoskeletal:  Unremarkable. IMPRESSION: Marked distention of the entire endometrial cavity by heterogeneously enhancing soft tissue density, which extends through the endocervical canal into the lower vagina. This is highly suspicious for endometrial carcinoma. Diffuse myometrial thinning due to marked distention of endometrial cavity limits evaluation; deep myometrial invasion cannot be excluded in the uterine fundus. No evidence of extra-uterine extension of tumor. Lymphadenopathy in lower abdominal retroperitoneum, bilateral iliac chains, and sigmoid mesocolon, consistent with metastatic disease. Sigmoid diverticulosis. No radiographic evidence of diverticulitis. Electronically Signed   By: JMarlaine HindM.D.   On: 11/25/2021 15:42

## 2021-12-02 NOTE — Progress Notes (Signed)
START ON PATHWAY REGIMEN - Uterine     A cycle is every 21 days:     Paclitaxel      Carboplatin   **Always confirm dose/schedule in your pharmacy ordering system**  Patient Characteristics: Carcinosarcoma, Newly Diagnosed (Clinical Staging), Nonsurgical Candidate, Symptomatic, Stage III/IV, MSS/pMMR Histology: Carcinosarcoma Therapeutic Status: Newly Diagnosed (Clinical Staging) AJCC M Category: cM0 Surgical Candidacy: Nonsurgical Candidate AJCC 8 Stage Grouping: IIIC2 AJCC T Category: cT3 AJCC N Category: cN2 Asymptomatic or Symptomatic<= Symptomatic Microsatellite/Mismatch Repair Status: MSS/pMMR  Intent of Therapy: Curative Intent, Discussed with Patient

## 2021-12-02 NOTE — Discharge Summary (Addendum)
Physician Discharge Summary  Kayla Mcgrath SHU:[id number] DOB: 29-Apr-1945 DOA: 11/30/2021  PCP: Lafonda Mosses, MD  Admit date: 11/30/2021 Discharge date: 12/02/2021 Recommendations for Outpatient Follow-up:  Follow up with PCP in 1 weeks-call for appointment Please obtain BMP/CBC in one week  Discharge Dispo: home Discharge Condition: Stable Code Status:   Code Status: Full Code Diet recommendation:  Diet Order             Diet regular Room service appropriate? Yes; Fluid consistency: Thin  Diet effective now                    Brief/Interim Summary: 76 year old female with recently diagnosed endometrial carcinoma, GAD, HLD, recent decline in appetite seen by GYN oncology Dr Berline Lopes as outpatient with plan for CT chest abdomen pelvis with contrast for staging along with port Placement in anticipation of chemotherapy, noted to have elevated creatinine of 3.4 from recent baseline 0.16 January 2021 so referred to the ED In the ED BP 120s/50s, afebrile. Labs with AKI transaminitis leukocytosis 14.5 anemia 8.9 CT renal study: Pathology retroperitoneal and bilateral pelvic adenopathy compatible with metastatic disease, large central uterine mass, no hydronephrosis/hydroureter, colonic diverticulosis noted sludge in the gallbladder noted.  Oncology consulted and patient admitted for further management. Anemia improving blood transfusion. Overall patient is medically stable and improved, oncology obtain CT imaging without contrast confirmed multiple lymph node involvement, repeat endometrial biopsy pending.  CT chest no evidence of metastatic disease in her lung ultrasound liver did not reveal any evidence of liver metastasis.  Oncology advised discharge and they will arrange for outpatient follow-up soon.    Discharge Diagnoses:  Acute kidney injury:Creat peaked 3.4, likely from patient's NSAID use and poor intake, NSAID has been discontinued, likely has permanent damage seen by  nephrology IV fluids discontinued.  Okay for discharge home and oncology will plan for outpatient follow-up Ortho BMP patient strictly advised not to use any kind of NSAIDs encouraged to increase oral intake at home  Recent Labs    01/15/21 1113 11/30/21 1500 11/30/21 1727 12/01/21 0551 12/02/21 0725  BUN 20 45* 45* 43* 40*  CREATININE 0.78 3.18* 3.40* 2.98* 2.63*    Acute cystitis WBC >50, LE mod, Nitrite Neg. SIRS/Leukocytosis E coli UTI No fever, wbc up 14k on admit>lactic acid normal. Urine culture w/ e coli- met sepsis criteria with uti, leucocytosis, tachycardia with HR 91-92 on admission- Will discharge on empiric antibiotic patient wants to go home today   Transaminitis: Mildly abnormal downtrending, monitor. RUQ [place] ordered on admission>, [place] will be held on discharge   Normocytic Anemia: folate, b12, anemia panel as below. Stable and improved Recent Labs (within last 365 days)         Recent Labs    10/22/21 0906 11/30/21 1500 11/30/21 1727 11/30/21 2321 12/01/21 0551  HGB 11.2* 9.6* 8.9*  --  7.3*  MCV 91.3 90.5 92.8  --  92.0  VITAMINB12  --   --   --  597  --   FOLATE  --   --   --  6.4  --   FERRITIN 159  --   --  418*  --   TIBC 293  --   --  155*  --   IRON 87  --   --  30  --   RETICCTPCT  --   --   --  0.8  --       GAD: Mood is stable continue her home meds  Consults: Nephro Oncology  Subjective: Alert awake oriented resting comfortably she feels bette, wants to go home today.  Oral fluids were discontinued by nephrology.  Discharge Exam: Vitals:   12/01/21 2144 12/02/21 0512  BP: (!) 103/59 (!) 111/53  Pulse: 70 62  Resp: 17 17  Temp: 99 F (37.2 C) 98.6 F (37 C)  SpO2: 96% 93%   General: Pt is alert, awake, not in acute distress Cardiovascular: RRR, S1/S2 +, no rubs, no gallops Respiratory: CTA bilaterally, no wheezing, no rhonchi Abdominal: Soft, NT, ND, bowel sounds + Extremities: no edema, no cyanosis  Discharge  Instructions  Discharge Instructions     Discharge instructions   Complete by: As directed    Check bmp in 1 week  Your urine culture are pending and please fu with Dr Alvy Bimler  Please call call MD or return to ER for similar or worsening recurring problem that brought you to hospital or if any fever,nausea/vomiting,abdominal pain, uncontrolled pain, chest pain,  shortness of breath or any other alarming symptoms.  Please follow-up your doctor as instructed in a week time and call the office for appointment.  Please avoid alcohol, smoking, or any other illicit substance and maintain healthy habits including taking your regular medications as prescribed.  You were cared for by a hospitalist during your hospital stay. If you have any questions about your discharge medications or the care you received while you were in the hospital after you are discharged, you can call the unit and ask to speak with the hospitalist on call if the hospitalist that took care of you is not available.  Once you are discharged, your primary care physician will handle any further medical issues. Please note that NO REFILLS for any discharge medications will be authorized once you are discharged, as it is imperative that you return to your primary care physician (or establish a relationship with a primary care physician if you do not have one) for your aftercare needs so that they can reassess your need for medications and monitor your lab values   Increase activity slowly   Complete by: As directed    Infusion Appointment Request (345 min)   Complete by: Dec 16, 2021    Contact your oncology clinic or infusion center to schedule this appointment.   Clinic Appointment Request   Complete by: Jan 13, 2022    Contact your oncology clinic or infusion center to schedule this appointment.   Infusion Appointment Request (315 min)   Complete by: Jan 13, 2022    Contact your oncology clinic or infusion center to schedule  this appointment.   Lab Appointment Request   Complete by: Jan 13, 2022    Please do not schedule a regular Lab appointment for this patient.  Patient's lab work will be drawn from their [facility] or [place], and a [employer] with Lab appointment is required.   Clinic Appointment Request   Complete by: Feb 03, 2022    Contact your oncology clinic or infusion center to schedule this appointment.   Infusion Appointment Request (315 min)   Complete by: Feb 03, 2022    Contact your oncology clinic or infusion center to schedule this appointment.   Lab Appointment Request   Complete by: Feb 03, 2022    Please do not schedule a regular Lab appointment for this patient.  Patient's lab work will be drawn from their [facility] or [place], and a [employer] with Lab appointment is required.  Clinic Appointment Request   Complete by: Feb 24, 2022    Contact your oncology clinic or infusion center to schedule this appointment.   Infusion Appointment Request (315 min)   Complete by: Feb 24, 2022    Contact your oncology clinic or infusion center to schedule this appointment.   Lab Appointment Request   Complete by: Feb 24, 2022    Please do not schedule a regular Lab appointment for this patient.  Patient's lab work will be drawn from their Port[facility] or [place], and a [employer] with Lab appointment is required.   Clinic Appointment Request   Complete by: Mar 17, 2022    Contact your oncology clinic or infusion center to schedule this appointment.   Infusion Appointment Request (315 min)   Complete by: Mar 17, 2022    Contact your oncology clinic or infusion center to schedule this appointment.   Lab Appointment Request   Complete by: Mar 17, 2022    Please do not schedule a regular Lab appointment for this patient.  Patient's lab work will be drawn from their Port[facility] or [place], and a [employer] with Lab appointment is required.   Clinic Appointment Request   Complete by: Apr 07, 2022    Contact your oncology clinic or infusion center to schedule this appointment.   Infusion Appointment Request (315 min)   Complete by: Apr 07, 2022    Contact your oncology clinic or infusion center to schedule this appointment.   Lab Appointment Request   Complete by: Apr 07, 2022    Please do not schedule a regular Lab appointment for this patient.  Patient's lab work will be drawn from their [facility] or [place], and a [employer] with Lab appointment is required.      Allergies as of 12/02/2021       Reactions   Tylenol [acetaminophen] Hives, Swelling   Lip swelling   Latex Rash        Medication List     STOP taking these medications    ibuprofen 200 MG tablet Commonly known as: ADVIL   rosuvastatin 20 MG tablet Commonly known as: CRESTOR       TAKE these medications    cefdinir 300 MG capsule Commonly known as: OMNICEF Take 1 capsule (300 mg total) by mouth 2 (two) times daily for 3 days.   cyanocobalamin 1000 MCG tablet Commonly known as: VITAMIN B12 Take 1,000 mcg by mouth daily at 2 PM.   escitalopram 10 MG tablet Commonly known as: LEXAPRO Take 1 tablet (10 mg total) by mouth daily.   famotidine 20 MG tablet Commonly known as: PEPCID Take 20 mg by mouth 2 (two) times daily as needed for heartburn or indigestion.   magnesium 30 MG tablet Take 30 mg by mouth daily.   OVER THE COUNTER MEDICATION Apply 1 application  topically as needed (knee pain). CBD Cream-   Turmeric 400 MG Caps Take 800 mg by mouth daily.   Vitamin D 50 MCG (2000 UT) Caps Take 2,000 Units by mouth daily. Take one by mouth daily        Follow-up Information     Lafonda Mosses, MD Follow up in 1 week(s).   Specialty: Gynecologic Oncology Contact information: [address] [place] [address] [phone]                Allergies  Allergen Reactions   Tylenol [Acetaminophen] Hives and Swelling    Lip  swelling   Latex Rash    The  results of significant diagnostics from this hospitalization (including imaging, microbiology, ancillary and laboratory) are listed below for reference.    Microbiology: Recent Results (from the past 240 hour(s))  Urine Culture     Status: Abnormal (Preliminary result)   Collection Time: 11/30/21  4:27 PM   Specimen: Urine, Clean Catch  Result Value Ref Range Status   Specimen Description   Final    URINE, CLEAN CATCH Performed at [facility], [place] [address] [place], [place] [address]    Special Requests   Final    NONE Performed at [facility], [place] [address] [place], [place] [address]    Culture (A)  Final    >=100,000 COLONIES/mL GRAM NEGATIVE RODS SUSCEPTIBILITIES TO FOLLOW Performed at [facility], [place] [address], [place], [place] [address]    Report Status PENDING  Incomplete    Procedures/Studies: CT CHEST WO CONTRAST  Result Date: 12/01/2021 CLINICAL DATA:  Cervical cancer staging.  * Tracking Code: BO * EXAM: CT CHEST WITHOUT CONTRAST TECHNIQUE: Multidetector CT imaging of the chest was performed following the standard protocol without IV contrast. RADIATION DOSE REDUCTION: This exam was performed according to the departmental dose-optimization program which includes automated exposure control, adjustment of the mA and/or kV according to patient size and/or use of iterative reconstruction technique. COMPARISON:  Abdominal CT 11/30/2021, pelvic MRI 11/25/2021 and chest radiographs 10/30/2017. FINDINGS: Cardiovascular: Mild atherosclerosis of the aorta, great vessels and coronary arteries. No acute vascular findings on noncontrast imaging. The heart size is normal. There is no pericardial effusion. Mediastinum/Nodes: There are no enlarged mediastinal, hilar or axillary lymph nodes.Hilar assessment is limited by the lack of intravenous contrast. There are surgical clips in the right axilla. Evidence of previous left thyroid lobectomy.  Small hiatal hernia. Lungs/Pleura: No pleural effusion or pneumothorax. Mild linear atelectasis or scarring at both lung bases. The lungs are otherwise clear, without suspicious pulmonary nodules. Upper abdomen: Previously demonstrated retroperitoneal a tendinopathy is partially visualized, grossly stable. No other significant findings within the visualized upper abdomen. Musculoskeletal/Chest wall: There is no chest wall mass or suspicious osseous finding. Status post right mastectomy and axillary node dissection. There is a right breast implant. There are degenerative changes in the spine associated with a thoracolumbar scoliosis. IMPRESSION: 1. No evidence of metastatic disease within the chest. 2. No acute chest findings. 3.  Aortic Atherosclerosis (ICD10-I70.0). Electronically Signed   By: Richardean Sale M.D.   On: 12/01/2021 16:39   US Abdomen Limited RUQ (LIVER/GB)  Result Date: 12/01/2021 CLINICAL DATA:  Abnormal LFTs. EXAM: ULTRASOUND ABDOMEN LIMITED RIGHT UPPER QUADRANT COMPARISON:  CT abdomen and pelvis 11/30/2021 FINDINGS: Gallbladder: There are multiple echogenic shadowing mobile gallstones. No gallbladder wall thickening. No pericholecystic fluid. Negative sonographic Murphy's sign. Common bile duct: Diameter: 5 mm, within normal limits. No intrahepatic or extrahepatic biliary ductal dilatation. Liver: No focal lesion identified. Smooth liver contours. Portal vein is patent on color Doppler imaging with normal direction of blood flow towards the liver. Other: None. IMPRESSION: 1. Cholelithiasis. No evidence of acute cholecystitis. 2. No biliary dilatation. Electronically Signed   By: Yvonne Kendall M.D.   On: 12/01/2021 16:39   CT Renal Stone Study  Result Date: 11/30/2021 CLINICAL DATA:  Uterine carcinosarcoma.  Abdominal pain. * Tracking Code: BO * EXAM: CT ABDOMEN AND PELVIS WITHOUT CONTRAST TECHNIQUE: Multidetector CT imaging of the abdomen and pelvis was performed following the standard  protocol  without IV contrast. RADIATION DOSE REDUCTION: This exam was performed according to the departmental dose-optimization program which includes automated exposure control, adjustment of the mA and/or kV according to patient size and/or use of iterative reconstruction technique. COMPARISON:  Pelvic MRI 11/25/2021 FINDINGS: Lower chest: Linear subsegmental atelectasis or scarring in the posterior basal segment right lower lobe. Lower margin of a right breast implant is visible. Hepatobiliary: Faint dependent density in the gallbladder could represent sludge or small gallstones. Noncontrast CT appearance of the liver otherwise unremarkable. Pancreas: Unremarkable Spleen: Unremarkable Adrenals/Urinary Tract: The adrenal glands appear unremarkable. Currently there is no hydronephrosis and no hydroureter. Urinary bladder unremarkable. No urinary tract calculus observed. Stomach/Bowel: Descending and sigmoid colon diverticulosis without active diverticulitis. Scattered diverticula in the remainder of the colon including the transverse colon. Normal appendix. No dilated bowel. Vascular/Lymphatic: Mild abdominal aortic atherosclerotic vascular calcification. Pathologic retroperitoneal and bilateral pelvic adenopathy. Index left periaortic node 2.3 cm in short axis on image 30 series 2. Index right common iliac node 1.6 cm in short axis on image 48 series 2. Index right external iliac node 2.2 cm in short axis on image 66 series 2. Index left external iliac node 3.0 cm in short axis on image 59 series 2. Substantial superior rectal and perirectal adenopathy noted with a left perirectal lymph node measuring 1.8 cm in short axis on image 66 series 2. Reproductive: Large central uterine mass better depicted on the recent MRI, but measuring about 5.6 cm anterior-posterior on image 110 series 6. Miniscule locules of gas along this mass, possibly biopsy related. Other: No ascites observed. Musculoskeletal: Mild dextroconvex  lumbar scoliosis with rotary component. IMPRESSION: 1. Pathologic retroperitoneal and bilateral pelvic adenopathy compatible with metastatic disease. 2. Large central uterine mass better depicted on recent MRI, compatible with malignancy. 3. No current hydronephrosis or hydroureter. 4. Colonic diverticulosis without active diverticulitis. 5. Faint dependent density in the gallbladder could represent sludge or small gallstones. 6. Mild dextroconvex lumbar scoliosis with rotary component. 7. Aortic atherosclerosis. Aortic Atherosclerosis (ICD10-I70.0). Electronically Signed   By: Van Clines M.D.   On: 11/30/2021 19:05   MR PELVIS W WO CONTRAST  Result Date: 11/25/2021 CLINICAL DATA:  Vaginal bleeding for 1 month.  Pelvic mass. EXAM: MRI PELVIS WITHOUT AND WITH CONTRAST TECHNIQUE: Multiplanar multisequence MR imaging of the pelvis was performed both before and after administration of intravenous contrast. CONTRAST:  6.74m GADAVIST GADOBUTROL 1 MMOL/ML IV SOLN COMPARISON:  None Available. FINDINGS: Lower Urinary Tract: No urinary bladder or urethral abnormality identified. Bowel: Sigmoid diverticulosis is noted, without evidence of diverticulitis. Vascular/Lymphatic: Lymphadenopathy is seen throughout the visualized lower abdominal retroperitoneum, bilateral iliac chains, in sigmoid mesocolon. Largest index lymph node is seen in the left external iliac chain measuring 2.6 cm short axis on image 15/3. This is consistent with metastatic disease. Reproductive: -- Uterus: Measures 16.3 x 8.4 by 11.8 cm (volume = 850 cm^3). No uterine fibroids identified. Marked distention of the entire endometrial cavity is seen by heterogeneously enhancing soft tissue density. This extends through the endocervical now in into the lower vagina. Double-layer thickness measures 71 mm. Diffuse myometrial thinning is seen due to marked distention of the endometrial cavity. This limits evaluation, however there is severe myometrial  thinning seen in the fundus; deep myometrial invasion cannot be excluded. No evidence of extra uterine extension of tumor. -- Right ovary: Not visualized. -- Left ovary:  Not visualized. Other: No peritoneal thickening or abnormal free fluid. Musculoskeletal:  Unremarkable. IMPRESSION: Marked distention of the entire  endometrial cavity by heterogeneously enhancing soft tissue density, which extends through the endocervical canal into the lower vagina. This is highly suspicious for endometrial carcinoma. Diffuse myometrial thinning due to marked distention of endometrial cavity limits evaluation; deep myometrial invasion cannot be excluded in the uterine fundus. No evidence of extra-uterine extension of tumor. Lymphadenopathy in lower abdominal retroperitoneum, bilateral iliac chains, and sigmoid mesocolon, consistent with metastatic disease. Sigmoid diverticulosis. No radiographic evidence of diverticulitis. Electronically Signed   By: Marlaine Hind M.D.   On: 11/25/2021 15:42    Labs: BNP (last 3 results) No results for input(s): "BNP" in the last 8760 hours. Basic Metabolic Panel: Recent Labs  Lab 11/30/21 1500 11/30/21 1727 12/01/21 0551 12/02/21 0725  NA 138 139 141 141  K 4.6 4.0 3.9 4.1  CL 104 107 109 109  CO2 _0 GLUCOSE 150* 123* 99 91  BUN 45* 45* 43* 40*  CREATININE 3.18* 3.40* 2.98* 2.63*  CALCIUM 9.4 8.7* 8.4* 8.4*  MG  --  1.9 1.8  --    Liver Function Tests: Recent Labs  Lab 11/30/21 1500 11/30/21 1727 12/01/21 0551 12/02/21 0725  AST 74* 72* 53* 50*  ALT 69* 69* 57* 55*  ALKPHOS 142* 129* 100 91  BILITOT 0.4 0.7 0.4 0.5  PROT 7.5 7.2 5.8* 6.0*  ALBUMIN 3.2* 2.4* 2.0* 2.0*   No results for input(s): "LIPASE", "AMYLASE" in the last 168 hours. No results for input(s): "AMMONIA" in the last 168 hours. CBC: Recent Labs  Lab 11/30/21 1500 11/30/21 1727 12/01/21 0551 12/01/21 2118 12/02/21 0722  WBC 16.6* 14.5* 11.3*  --  9.8  NEUTROABS 14.1* 12.2* 8.9*   --  7.3  HGB 9.6* 8.9* 7.3* 8.6* 8.9*  HCT 29.5* 28.4* 22.9* 26.6* 27.7*  MCV 90.5 92.8 92.0  --  92.0  PLT 395 350 302  --  287   Cardiac Enzymes: Recent Labs  Lab 11/30/21 2321  CKTOTAL 57  Anemia work up Recent Labs    11/30/21 2321  VITAMINB12 597  FOLATE 6.4  FERRITIN 418*  TIBC 155*  IRON 30  RETICCTPCT 0.8   Urinalysis    Component Value Date/Time   COLORURINE STRAW (A) 11/30/2021 1627   APPEARANCEUR TURBID (A) 11/30/2021 1627   LABSPEC 1.016 11/30/2021 1627   PHURINE 5.0 11/30/2021 1627   GLUCOSEU NEGATIVE 11/30/2021 1627   HGBUR LARGE (A) 11/30/2021 1627   BILIRUBINUR NEGATIVE 11/30/2021 1627   BILIRUBINUR 1+ 07/19/2021 0917   KETONESUR NEGATIVE 11/30/2021 1627   PROTEINUR 100 (A) 11/30/2021 1627   UROBILINOGEN 0.2 07/19/2021 0917   NITRITE NEGATIVE 11/30/2021 1627   LEUKOCYTESUR MODERATE (A) 11/30/2021 1627   Sepsis Labs Recent Labs  Lab 11/30/21 1500 11/30/21 1727 12/01/21 0551 12/02/21 0722  WBC 16.6* 14.5* 11.3* 9.8   Microbiology Recent Results (from the past 240 hour(s))  Urine Culture     Status: Abnormal (Preliminary result)   Collection Time: 11/30/21  4:27 PM   Specimen: Urine, Clean Catch  Result Value Ref Range Status   Specimen Description   Final    URINE, CLEAN CATCH Performed at [facility], [place] [address] [place], [place] [address]    Special Requests   Final    NONE Performed at [facility], [place] [address] [place], [place] [address]    Culture (A)  Final    >=100,000 COLONIES/mL GRAM NEGATIVE RODS SUSCEPTIBILITIES TO FOLLOW Performed at [facility], [place],  [place] [address]    Report Status PENDING  Incomplete  Time coordinating discharge: 25 minutes SIGNED: Antonieta Pert, MD  [facility] 12/02/2021, 1:04 PM  If 7PM-7AM, please contact night-coverage [email or web address]

## 2021-12-02 NOTE — Progress Notes (Signed)
Admit: 11/30/2021 LOS: 2  73F AKI from hypovolemia + NSAIDs, recent dx metastatic uterine cancer  Subjective:  SCr  down to 2.6, UOP almost 1L Feels well, tolerating PO food/liquids Remains on IVF, stopped this AM  11/22 0701 - 11/23 0700 In: 2522 [I.V.:2065.7; Blood:356.3; IV Piggyback:100] Out: 950 [Urine:950]  Filed Weights   11/30/21 2257 12/01/21 0400 12/02/21 0512  Weight: 66.3 kg 66.4 kg 68.9 kg    Scheduled Meds:  escitalopram  10 mg Oral Daily   famotidine  10 mg Oral Daily   Continuous Infusions:  cefTRIAXone (ROCEPHIN)  IV 1 g (12/01/21 2000)   PRN Meds:.alum & mag hydroxide-simeth, LORazepam, melatonin  Current Labs: reviewed    Physical Exam:  Blood pressure (!) 111/53, pulse 62, temperature 98.6 F (37 C), temperature source Oral, resp. rate 17, height 5' (1.524 m), weight 68.9 kg, SpO2 93 %. GEN: NAD, lying in bed, conversant ENT: NCAT EYES: EOMI CV: Regular, normal S1 and S2 PULM: Clear bilaterally, normal work of breathing ABD: Soft, nontender, bowel sounds present SKIN: No rashes or lesions EXT: No peripheral edema NEURO: Nonfocal  A AKI likely ATN from hypovolemia + NSAIDs, improving slowly, good UOP.  AIN possible as well.  Tol PO hydration, doesn't need IVFs. Followed closely by oncology and they will follow labs.   Metastatic uterine cancer followed by medical oncology and Gyn ONC Anemia, per oncology  P Improving OK for DC, oncology will follow labs Will not immediately arrange nephrology f/u, will be happy to see if oncology has ongoing concerns or GFR does not fully resolve to baseline.   Pearson Grippe MD 12/02/2021, 10:45 AM  Recent Labs  Lab 11/30/21 1727 12/01/21 0551 12/02/21 0725  NA 139 141 141  K 4.0 3.9 4.1  CL 107 109 109  CO2 '26 27 24  '$ GLUCOSE 123* 99 91  BUN 45* 43* 40*  CREATININE 3.40* 2.98* 2.63*  CALCIUM 8.7* 8.4* 8.4*   Recent Labs  Lab 11/30/21 1727 12/01/21 0551 12/01/21 2118 12/02/21 0722  WBC 14.5*  11.3*  --  9.8  NEUTROABS 12.2* 8.9*  --  7.3  HGB 8.9* 7.3* 8.6* 8.9*  HCT 28.4* 22.9* 26.6* 27.7*  MCV 92.8 92.0  --  92.0  PLT 350 302  --  287

## 2021-12-02 NOTE — Progress Notes (Signed)
Mobility Specialist - Progress Note   12/02/21 1013  Mobility  Activity Ambulated with assistance in hallway  Level of Assistance Independent after set-up  Assistive Device None  Distance Ambulated (ft) 500 ft  Activity Response Tolerated well  Mobility Referral Yes  $Mobility charge 1 Mobility   Pt received in bed and agreed to mobility, no c/o pain nor discomfort. Pt returned to bed with all needs met.   Roderick Pee Mobility Specialist

## 2021-12-03 ENCOUNTER — Other Ambulatory Visit: Payer: Self-pay

## 2021-12-03 ENCOUNTER — Ambulatory Visit (HOSPITAL_COMMUNITY): Admission: RE | Admit: 2021-12-03 | Payer: Medicare HMO | Source: Ambulatory Visit

## 2021-12-03 ENCOUNTER — Ambulatory Visit (HOSPITAL_COMMUNITY): Payer: Medicare HMO

## 2021-12-03 DIAGNOSIS — R238 Other skin changes: Secondary | ICD-10-CM | POA: Diagnosis not present

## 2021-12-03 LAB — URINE CULTURE: Culture: >=100000 — AB

## 2021-12-06 ENCOUNTER — Telehealth: Payer: Self-pay | Admitting: Surgery

## 2021-12-06 NOTE — Telephone Encounter (Signed)
Called to see how patient is feeling. LVM.

## 2021-12-06 NOTE — Progress Notes (Signed)
The pharmacy team has substituted IV diphenhydramine for IV cetirizine as a premedication. Patient will be monitored for hypersensitivity reaction and adverse reactions to IV cetirizine. Thanks.   Kennith Center, Pharm.D., CPP 12/06/2021'@4'$ :18 PM

## 2021-12-07 ENCOUNTER — Telehealth: Payer: Self-pay

## 2021-12-07 ENCOUNTER — Telehealth: Payer: Self-pay | Admitting: *Deleted

## 2021-12-07 LAB — SURGICAL PATHOLOGY

## 2021-12-07 NOTE — Patient Outreach (Signed)
  Care Coordination TOC Note Transition Care Management Unsuccessful Follow-up Telephone Call  Date of discharge and from where:  [facility] 11/30/21-12/02/21  Attempts:  1st Attempt  Reason for unsuccessful TCM follow-up call:  Left voice message  Johnney Killian, RN, BSN, CCM Care Management Coordinator [facility] Phone: [phone]: [phone]

## 2021-12-07 NOTE — Telephone Encounter (Signed)
Patient's daughter called and left a message stating "mom is good and we will be there on Friday for the appts."

## 2021-12-07 NOTE — Telephone Encounter (Signed)
Called patient and daughter to check how she's doing following ER visit. LVM

## 2021-12-08 ENCOUNTER — Telehealth: Payer: Self-pay

## 2021-12-08 LAB — SURGICAL PATHOLOGY

## 2021-12-08 NOTE — Patient Outreach (Signed)
  Care Coordination TOC Note Transition Care Management Unsuccessful Follow-up Telephone Call  Date of discharge and from where:  [facility] 11/30/21-12/02/21  Attempts:  2nd Attempt  Reason for unsuccessful TCM follow-up call:  Left voice message  Johnney Killian, RN, BSN, CCM Care Management Coordinator [facility] Phone: [phone]: [phone]

## 2021-12-09 ENCOUNTER — Telehealth: Payer: Self-pay

## 2021-12-09 ENCOUNTER — Other Ambulatory Visit: Payer: Self-pay | Admitting: Hematology and Oncology

## 2021-12-09 ENCOUNTER — Telehealth: Payer: Self-pay | Admitting: Gynecologic Oncology

## 2021-12-09 ENCOUNTER — Ambulatory Visit: Payer: Medicare HMO | Admitting: Obstetrics and Gynecology

## 2021-12-09 NOTE — Telephone Encounter (Signed)
Called and told herappt for port placement canceled on 12/5 due to having surgery. She verbalized understanding.

## 2021-12-09 NOTE — Patient Outreach (Signed)
  Care Coordination TOC Note Transition Care Management Unsuccessful Follow-up Telephone Call  Date of discharge and from where:  [facility] 12/02/21  Attempts:  3rd Attempt  Reason for unsuccessful TCM follow-up call:  No answer/busy

## 2021-12-09 NOTE — Telephone Encounter (Signed)
Spoke with patient - discussed plan for surgery given repeat biopsies can not confirm that this is carcinosarcoma. Appt with Dr. Alvy Bimler cancelled for tomorrow and she is instead scheduled with me at 12 and at 12:30 with [facility] for pre-op. Tentative plan for surgery on 12/13.  Jeral Pinch MD Gynecologic Oncology

## 2021-12-10 ENCOUNTER — Encounter: Payer: Self-pay | Admitting: Gynecologic Oncology

## 2021-12-10 ENCOUNTER — Inpatient Hospital Stay (HOSPITAL_BASED_OUTPATIENT_CLINIC_OR_DEPARTMENT_OTHER): Payer: Medicare HMO | Admitting: Gynecologic Oncology

## 2021-12-10 ENCOUNTER — Other Ambulatory Visit: Payer: Medicare HMO

## 2021-12-10 ENCOUNTER — Other Ambulatory Visit: Payer: Self-pay

## 2021-12-10 ENCOUNTER — Ambulatory Visit: Payer: Medicare HMO | Admitting: Hematology and Oncology

## 2021-12-10 ENCOUNTER — Inpatient Hospital Stay: Payer: Medicare HMO | Attending: Hematology and Oncology | Admitting: Gynecologic Oncology

## 2021-12-10 VITALS — BP 136/73 | HR 82 | Temp 99.0°F | Resp 17 | Wt 146.2 lb

## 2021-12-10 DIAGNOSIS — C549 Malignant neoplasm of corpus uteri, unspecified: Secondary | ICD-10-CM | POA: Insufficient documentation

## 2021-12-10 DIAGNOSIS — N950 Postmenopausal bleeding: Secondary | ICD-10-CM | POA: Diagnosis not present

## 2021-12-10 DIAGNOSIS — C541 Malignant neoplasm of endometrium: Secondary | ICD-10-CM

## 2021-12-10 DIAGNOSIS — R634 Abnormal weight loss: Secondary | ICD-10-CM | POA: Insufficient documentation

## 2021-12-10 DIAGNOSIS — Z7189 Other specified counseling: Secondary | ICD-10-CM | POA: Diagnosis not present

## 2021-12-10 DIAGNOSIS — Z853 Personal history of malignant neoplasm of breast: Secondary | ICD-10-CM | POA: Insufficient documentation

## 2021-12-10 DIAGNOSIS — N179 Acute kidney failure, unspecified: Secondary | ICD-10-CM | POA: Diagnosis not present

## 2021-12-10 DIAGNOSIS — Z85828 Personal history of other malignant neoplasm of skin: Secondary | ICD-10-CM | POA: Insufficient documentation

## 2021-12-10 DIAGNOSIS — C778 Secondary and unspecified malignant neoplasm of lymph nodes of multiple regions: Secondary | ICD-10-CM | POA: Insufficient documentation

## 2021-12-10 MED ORDER — SENNOSIDES-DOCUSATE SODIUM 8.6-50 MG PO TABS: 2.0000 | ORAL_TABLET | Freq: Every day | ORAL | 0 refills | Status: DC

## 2021-12-10 MED ORDER — TRAMADOL HCL 50 MG PO TABS: 50.0000 mg | ORAL_TABLET | Freq: Two times a day (BID) | ORAL | 0 refills | Status: DC | PRN

## 2021-12-10 NOTE — H&P (View-Only) (Signed)
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 Pelvic sonogram is otherwise unremarkable.    11/11/2021 Pathology Results   A. CERVIX, MASS, EXCISION:  - HIGH-GRADE UNDIFFERENTIATED MALIGNANCY WITH EXTENSIVE NECROSIS (SEE COMMENT).   COMMENT:  The tumor consists of mostly high-grade sarcomatoid morphology with focal epithelioid morphology.  Immunohistochemical stains are performed on block A4.  The tumor cells are positive for desmin and p16 while negative for CD45, CK7, CK20, chromogranin, synaptophysin, Melan-A, p40, AE1/AE3, SMA, p63, cyclin D1, S100 and smooth muscle myosin.  Ki-67 proliferation index is high.  CD10 staining shows focal nonspecific staining.  The differential diagnosis includes a high-grade sarcoma and a partially sampled carcinosarcoma.    11/25/2021 Imaging   MRI pelvis Marked distention of the entire endometrial cavity by heterogeneously enhancing soft tissue density, which extends through the endocervical canal into the lower vagina. This is highly suspicious for endometrial carcinoma.   Diffuse myometrial thinning due to marked distention of endometrial cavity limits evaluation; deep myometrial invasion cannot be excluded in the uterine fundus.   No evidence of extra-uterine extension of tumor.   Lymphadenopathy in lower abdominal retroperitoneum, bilateral iliac chains, and sigmoid mesocolon, consistent with metastatic disease.   Sigmoid diverticulosis. No radiographic evidence of diverticulitis.   11/26/2021 Initial Diagnosis   Uterine cancer ([place])   11/26/2021 Cancer Staging   Staging form: Corpus Uteri - Carcinoma and Carcinosarcoma, AJCC 8th Edition - Clinical stage from 11/26/2021: FIGO Stage IIIC2 (cT3, cN2, cM0)  - Signed by Heath Lark, MD on 11/26/2021 Stage prefix: Initial diagnosis Histologic grade (G): G3 Histologic grading system: 3 grade system   11/30/2021 Pathology Results   FINAL MICROSCOPIC DIAGNOSIS:  A. UTERINE CONTENTS, BIOPSY: - HIGH-GRADE UNDIFFERENTIATED MALIGNANCY WITH EXTENSIVE NECROSIS (SEE COMMENT).  COMMENT: The patient's history of a high-grade undifferentiated malignancy with extensive necrosis of the cervix is noted.  The morphological features of the current tumor cells are similar to the tumor cells seen in the previous cervical mass excision.  Immunohistochemical staining for desmin and cytokeratin AE1/AE3 is performed on block A1.  The tumor cells are diffusely positive for desmin.   AE1/AE3 stain is focally positive, however this is scant and of unclear clinical significance. Given the small quantity of tissue in the current biopsy, a definitive distinction between a high-grade sarcoma and carcinosarcoma cannot be made as this biopsy may not be representative of the entire tumor. If clinically indicated, this case as well as the previous cervical mass excision ([id number]) may be sent out for expert consultation. Clinical correlation recommended.     12/01/2021 Tumor Marker   Patient's tumor was tested for the following markers: CA-125. Results of the tumor marker test revealed 491.   12/16/2021 - 12/16/2021 Chemotherapy   Patient is on Treatment Plan : UTERINE Carboplatin AUC 6 + Paclitaxel q21d      MMR intact, PDL1 CPS 1%  Interval History: Doing well.  Denies any abdominal pain.  Had some diarrhea related to antibiotics for her urinary tract infection, this is improving.  Endorses a good appetite without nausea or emesis.  Denies any urinary symptoms.  Past Medical/Surgical History: Past Medical History:  Diagnosis Date   Adjustment disorder with anxious mood 12/05/2018   Arthralgia 06/29/2015   Breast cancer ([place]) 2010   Colon  polyps    ?   Complication  of anesthesia    DCIS (ductal carcinoma in situ) of breast 2010   Diverticulosis    Full dentures    Hyperlipidemia    Joint pain    Leukocytosis 07/11/2017   PONV (postoperative nausea and vomiting)    Skin cancer    SCC   Wears glasses     Past Surgical History:  Procedure Laterality Date   BREAST BIOPSY Right 05/11/2012   BREAST BIOPSY Right 04/24/2012   BREAST LUMPECTOMY Right 12/2008   cancer-tamoxifen   BREAST RECONSTRUCTION WITH PLACEMENT OF TISSUE EXPANDER AND FLEX HD (ACELLULAR HYDRATED DERMIS) Right 06/20/2012   Procedure: BREAST RECONSTRUCTION WITH PLACEMENT OF TISSUE EXPANDER AND FLEX HD (ACELLULAR HYDRATED DERMIS);  Surgeon: Theodoro Kos, DO;  Location: [place];  Service: Plastics;  Laterality: Right;   BREAST REDUCTION WITH MASTOPEXY Left 10/11/2012   Procedure: LEFT BREAST REDUCTION WITH MASTOPEXY;  Surgeon: Theodoro Kos, DO;  Location: [place] [facility];  Service: Plastics;  Laterality: Left;   cataract sugery Bilateral 03/2019   COLONOSCOPY  08/28/2013   [place]   DILATION AND CURETTAGE OF UTERUS     heavy bleeding between two pregnancies   LIPOSUCTION WITH LIPOFILLING Left 10/11/2012   Procedure: LIPOSUCTION WITH LIPOFILLING;  Surgeon: Theodoro Kos, DO;  Location: [place];  Service: Plastics;  Laterality: Left;   MASTECTOMY Right 06/20/2012   MASTECTOMY W/ SENTINEL NODE BIOPSY Right 06/20/2012   Procedure: right skin sparing MASTECTOMY WITH SENTINEL LYMPH NODE BIOPSY;  Surgeon: Stark Klein, MD;  Location: [place];  Service: General;  Laterality: Right;   PARATHYROIDECTOMY Left 11/07/2017   Procedure: NECK EXPLORATION WITH LEFT THYROID LOBECTOMY;  Surgeon: [name], MD;  Location: [facility];  Service: General;  Laterality: Left;   POLYPECTOMY     REDUCTION MAMMAPLASTY Left 10/2012   REMOVAL OF TISSUE EXPANDER AND PLACEMENT OF IMPLANT Right 10/11/2012   Procedure: REMOVAL OF TISSUE EXPANDER AND  PLACEMENT OF SILICONE IMPLANT RIGHT;  Surgeon: Theodoro Kos, DO;  Location: [place];  Service: Plastics;  Laterality: Right;    Family History  Problem Relation Age of Onset   Alcohol abuse Mother    Colon polyps Mother    Mental illness Mother    Alcohol abuse Father        suicide   Mental illness Father    Cancer Sister        gyn cancer   Heart disease Paternal Grandfather    Colon cancer Neg Hx    Esophageal cancer Neg Hx    Rectal cancer Neg Hx    Stomach cancer Neg Hx    Breast cancer Neg Hx    Ovarian cancer Neg Hx    Endometrial cancer Neg Hx    Pancreatic cancer Neg Hx    Prostate cancer Neg Hx     Social History   Socioeconomic History   Marital status: Married    Spouse name: Not on file   Number of children: 2   Years of education: Not on file   Highest education level: Not on file  Occupational History   Occupation: retired LPN  Tobacco Use   Smoking status: Former    Types: Cigarettes    Quit date: 06/14/1981    Years since quitting: 40.5   Smokeless tobacco: Never  Vaping Use   Vaping Use: Never used  Substance and Sexual Activity   Alcohol use: No  Drug use: No   Sexual activity: Not Currently    Birth control/protection: Post-menopausal  Other Topics Concern   Not on file  Social History Narrative   Recently moved from [place] to be closer to her two daughters and grandchildren.   Retired Corporate treasurer.   DNR- forms singed on 05/07/2012 and returned to pt.   Social Determinants of Health   Financial Resource Strain: Low Risk  (02/02/2021)   Overall Financial Resource Strain (CARDIA)    Difficulty of Paying Living Expenses: Not hard at all  Food Insecurity: No Food Insecurity (11/30/2021)   Hunger Vital Sign    Worried About Running Out of Food in the Last Year: Never true    Ran Out of Food in the Last Year: Never true  Transportation Needs: No Transportation Needs (11/30/2021)   PRAPARE - Hydrologist  (Medical): No    Lack of Transportation (Non-Medical): No  Physical Activity: Insufficiently Active (02/02/2021)   Exercise Vital Sign    Days of Exercise per Week: 3 days    Minutes of Exercise per Session: 30 min  Stress: No Stress Concern Present (02/02/2021)   [place]    Feeling of Stress : Not at all  Social Connections: Moderately Integrated (02/02/2021)   Social Connection and Isolation Panel [NHANES]    Frequency of Communication with Friends and Family: Twice a week    Frequency of Social Gatherings with Friends and Family: Twice a week    Attends Religious Services: Never    Marine scientist or Organizations: Yes    Attends Music therapist: More than 4 times per year    Marital Status: Married    Current Medications:  Current Outpatient Medications:    senna-docusate (SENOKOT-S) 8.6-50 MG tablet, Take 2 tablets by mouth at bedtime. For AFTER surgery, do not take if having diarrhea, Disp: 30 tablet, Rfl: 0   traMADol (ULTRAM) 50 MG tablet, Take 1 tablet (50 mg total) by mouth every 12 (twelve) hours as needed for severe pain. For AFTER surgery only, do not take and drive, Disp: 10 tablet, Rfl: 0   Cholecalciferol (VITAMIN D) 2000 UNITS CAPS, Take 2,000 Units by mouth daily. Take one by mouth daily, Disp: , Rfl:    escitalopram (LEXAPRO) 10 MG tablet, Take 1 tablet (10 mg total) by mouth daily., Disp: 90 tablet, Rfl: 3   famotidine (PEPCID) 20 MG tablet, Take 20 mg by mouth 2 (two) times daily as needed for heartburn or indigestion., Disp: , Rfl:    magnesium 30 MG tablet, Take 30 mg by mouth daily., Disp: , Rfl:    OVER THE COUNTER MEDICATION, Apply 1 application  topically as needed (knee pain). CBD Cream-, Disp: , Rfl:    Turmeric 400 MG CAPS, Take 800 mg by mouth daily., Disp: , Rfl:    vitamin B-12 (CYANOCOBALAMIN) 1000 MCG tablet, Take 1,000 mcg by mouth daily at 2 PM., Disp: , Rfl:    Review of Systems: Denies appetite changes, fevers, chills, fatigue, unexplained weight changes. Denies hearing loss, neck lumps or masses, mouth sores, ringing in ears or voice changes. Denies cough or wheezing.  Denies shortness of breath. Denies chest pain or palpitations. Denies leg swelling. Denies abdominal distention, pain, blood in stools, constipation, nausea, vomiting, or early satiety. Denies pain with intercourse, dysuria, frequency, hematuria or incontinence. Denies hot flashes, pelvic pain, vaginal bleeding or vaginal discharge.   Denies joint  pain, back pain or muscle pain/cramps. Denies itching, rash, or wounds. Denies dizziness, headaches, numbness or seizures. Denies swollen lymph nodes or glands, denies easy bruising or bleeding. Denies anxiety, depression, confusion, or decreased concentration.  Physical Exam: BP 136/73 (BP Location: Left Arm, Patient Position: Sitting)   Pulse 82   Temp 99 F (37.2 C) (Oral)   Resp 17   Wt 146 lb 4 oz (66.3 kg)   SpO2 99%   BMI 28.56 kg/m  General: Alert, oriented, no acute distress. HEENT: Normocephalic, atraumatic. Chest: Unlabored breathing on room air.  Laboratory & Radiologic Studies: None new  Assessment & Plan: Kayla Mcgrath is a 76 y.o. woman with at least stage III uterine carcinosarcoma vs sarcoma.  Patient is recovering well after her recent hospitalization.  Will plan to get labs next week to check kidney function.  Reviewed again recent biopsy which is unable to give [place] a definitive diagnosis between sarcoma and carcinosarcoma.  Given this and the difference in treatment, I am recommending that we proceed with surgery with a goal of maximal tumor debulking.  We discussed the plan for a diagnostic laparoscopy, robotic versus open assisted hysterectomy, bilateral salpingo-oophorectomy, tumor debulking including lymph node dissection, mini-laparotomy.  Discussed plan for assessment at the start of surgery  regarding feasibility of doing a portion of the surgery in a minimally invasive manner.  Given the size of her uterus, she will require at least a mini laparotomy for specimen delivery.  The risks of surgery were discussed in detail and she understands these to include infection; wound separation; hernia; vaginal cuff separation, injury to adjacent organs such as bowel, bladder, blood vessels, ureters and nerves; bleeding which may require blood transfusion; anesthesia risk; thromboembolic events; possible death; unforeseen complications; possible need for re-exploration; medical complications such as heart attack, stroke, pleural effusion and pneumonia; and, if full lymphadenectomy is performed the risk of lymphedema and lymphocyst. The patient will receive DVT and antibiotic prophylaxis as indicated. She voiced a clear understanding. She had the opportunity to ask questions. Perioperative instructions were reviewed with her. Prescriptions for post-op medications were sent to her pharmacy of choice.  Will plan for 2 versus 4 weeks of postoperative DVT prophylaxis.  Will wait to make a decision about Lovenox versus oral anticoagulant after surgery pending patient's creatinine.  28 minutes of total time was spent for this patient encounter, including preparation, face-to-face counseling with the patient and coordination of care, and documentation of the encounter.  Jeral Pinch, MD  Division of Gynecologic Oncology  Department of Obstetrics and Gynecology  [facility]

## 2021-12-10 NOTE — Progress Notes (Signed)
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 Pelvic sonogram is otherwise unremarkable.    11/11/2021 Pathology Results   A. CERVIX, MASS, EXCISION:  - HIGH-GRADE UNDIFFERENTIATED MALIGNANCY WITH EXTENSIVE NECROSIS (SEE COMMENT).   COMMENT:  The tumor consists of mostly high-grade sarcomatoid morphology with focal epithelioid morphology.  Immunohistochemical stains are performed on block A4.  The tumor cells are positive for desmin and p16 while negative for CD45, CK7, CK20, chromogranin, synaptophysin, Melan-A, p40, AE1/AE3, SMA, p63, cyclin D1, S100 and smooth muscle myosin.  Ki-67 proliferation index is high.  CD10 staining shows focal nonspecific staining.  The differential diagnosis includes a high-grade sarcoma and a partially sampled carcinosarcoma.    11/25/2021 Imaging   MRI pelvis Marked distention of the entire endometrial cavity by heterogeneously enhancing soft tissue density, which extends through the endocervical canal into the lower vagina. This is highly suspicious for endometrial carcinoma.   Diffuse myometrial thinning due to marked distention of endometrial cavity limits evaluation; deep myometrial invasion cannot be excluded in the uterine fundus.   No evidence of extra-uterine extension of tumor.   Lymphadenopathy in lower abdominal retroperitoneum, bilateral iliac chains, and sigmoid mesocolon, consistent with metastatic disease.   Sigmoid diverticulosis. No radiographic evidence of diverticulitis.   11/26/2021 Initial Diagnosis   Uterine cancer ([place])   11/26/2021 Cancer Staging   Staging form: Corpus Uteri - Carcinoma and Carcinosarcoma, AJCC 8th Edition - Clinical stage from 11/26/2021: FIGO Stage IIIC2 (cT3, cN2, cM0)  - Signed by Heath Lark, MD on 11/26/2021 Stage prefix: Initial diagnosis Histologic grade (G): G3 Histologic grading system: 3 grade system   11/30/2021 Pathology Results   FINAL MICROSCOPIC DIAGNOSIS:  A. UTERINE CONTENTS, BIOPSY: - HIGH-GRADE UNDIFFERENTIATED MALIGNANCY WITH EXTENSIVE NECROSIS (SEE COMMENT).  COMMENT: The patient's history of a high-grade undifferentiated malignancy with extensive necrosis of the cervix is noted.  The morphological features of the current tumor cells are similar to the tumor cells seen in the previous cervical mass excision.  Immunohistochemical staining for desmin and cytokeratin AE1/AE3 is performed on block A1.  The tumor cells are diffusely positive for desmin.   AE1/AE3 stain is focally positive, however this is scant and of unclear clinical significance. Given the small quantity of tissue in the current biopsy, a definitive distinction between a high-grade sarcoma and carcinosarcoma cannot be made as this biopsy may not be representative of the entire tumor. If clinically indicated, this case as well as the previous cervical mass excision ([id number]) may be sent out for expert consultation. Clinical correlation recommended.     12/01/2021 Tumor Marker   Patient's tumor was tested for the following markers: CA-125. Results of the tumor marker test revealed 491.   12/16/2021 - 12/16/2021 Chemotherapy   Patient is on Treatment Plan : UTERINE Carboplatin AUC 6 + Paclitaxel q21d      MMR intact, PDL1 CPS 1%  Interval History: Doing well.  Denies any abdominal pain.  Had some diarrhea related to antibiotics for her urinary tract infection, this is improving.  Endorses a good appetite without nausea or emesis.  Denies any urinary symptoms.  Past Medical/Surgical History: Past Medical History:  Diagnosis Date   Adjustment disorder with anxious mood 12/05/2018   Arthralgia 06/29/2015   Breast cancer ([place]) 2010   Colon  polyps    ?   Complication  of anesthesia    DCIS (ductal carcinoma in situ) of breast 2010   Diverticulosis    Full dentures    Hyperlipidemia    Joint pain    Leukocytosis 07/11/2017   PONV (postoperative nausea and vomiting)    Skin cancer    SCC   Wears glasses     Past Surgical History:  Procedure Laterality Date   BREAST BIOPSY Right 05/11/2012   BREAST BIOPSY Right 04/24/2012   BREAST LUMPECTOMY Right 12/2008   cancer-tamoxifen   BREAST RECONSTRUCTION WITH PLACEMENT OF TISSUE EXPANDER AND FLEX HD (ACELLULAR HYDRATED DERMIS) Right 06/20/2012   Procedure: BREAST RECONSTRUCTION WITH PLACEMENT OF TISSUE EXPANDER AND FLEX HD (ACELLULAR HYDRATED DERMIS);  Surgeon: Theodoro Kos, DO;  Location: [facility];  Service: Plastics;  Laterality: Right;   BREAST REDUCTION WITH MASTOPEXY Left 10/11/2012   Procedure: LEFT BREAST REDUCTION WITH MASTOPEXY;  Surgeon: Theodoro Kos, DO;  Location: [facility];  Service: Plastics;  Laterality: Left;   cataract sugery Bilateral 03/2019   COLONOSCOPY  08/28/2013   [facility]   DILATION AND CURETTAGE OF UTERUS     heavy bleeding between two pregnancies   LIPOSUCTION WITH LIPOFILLING Left 10/11/2012   Procedure: LIPOSUCTION WITH LIPOFILLING;  Surgeon: Theodoro Kos, DO;  Location: [facility];  Service: Plastics;  Laterality: Left;   MASTECTOMY Right 06/20/2012   MASTECTOMY W/ SENTINEL NODE BIOPSY Right 06/20/2012   Procedure: right skin sparing MASTECTOMY WITH SENTINEL LYMPH NODE BIOPSY;  Surgeon: Stark Klein, MD;  Location: [facility];  Service: General;  Laterality: Right;   PARATHYROIDECTOMY Left 11/07/2017   Procedure: NECK EXPLORATION WITH LEFT THYROID LOBECTOMY;  Surgeon: [name], MD;  Location: [facility];  Service: General;  Laterality: Left;   POLYPECTOMY     REDUCTION MAMMAPLASTY Left 10/2012   REMOVAL OF TISSUE EXPANDER AND PLACEMENT OF IMPLANT Right 10/11/2012   Procedure: REMOVAL OF TISSUE EXPANDER AND  PLACEMENT OF SILICONE IMPLANT RIGHT;  Surgeon: Theodoro Kos, DO;  Location: [facility];  Service: Plastics;  Laterality: Right;    Family History  Problem Relation Age of Onset   Alcohol abuse Mother    Colon polyps Mother    Mental illness Mother    Alcohol abuse Father        suicide   Mental illness Father    Cancer Sister        gyn cancer   Heart disease Paternal Grandfather    Colon cancer Neg Hx    Esophageal cancer Neg Hx    Rectal cancer Neg Hx    Stomach cancer Neg Hx    Breast cancer Neg Hx    Ovarian cancer Neg Hx    Endometrial cancer Neg Hx    Pancreatic cancer Neg Hx    Prostate cancer Neg Hx     Social History   Socioeconomic History   Marital status: Married    Spouse name: Not on file   Number of children: 2   Years of education: Not on file   Highest education level: Not on file  Occupational History   Occupation: retired LPN  Tobacco Use   Smoking status: Former    Types: Cigarettes    Quit date: 06/14/1981    Years since quitting: 40.5   Smokeless tobacco: Never  Vaping Use   Vaping Use: Never used  Substance and Sexual Activity   Alcohol use: No  Drug use: No   Sexual activity: Not Currently    Birth control/protection: Post-menopausal  Other Topics Concern   Not on file  Social History Narrative   Recently moved from [place] to be closer to her two daughters and grandchildren.   Retired Corporate treasurer.   DNR- forms singed on 05/07/2012 and returned to pt.   Social Determinants of Health   Financial Resource Strain: Low Risk  (02/02/2021)   Overall Financial Resource Strain (CARDIA)    Difficulty of Paying Living Expenses: Not hard at all  Food Insecurity: No Food Insecurity (11/30/2021)   Hunger Vital Sign    Worried About Running Out of Food in the Last Year: Never true    Ran Out of Food in the Last Year: Never true  Transportation Needs: No Transportation Needs (11/30/2021)   PRAPARE - Hydrologist  (Medical): No    Lack of Transportation (Non-Medical): No  Physical Activity: Insufficiently Active (02/02/2021)   Exercise Vital Sign    Days of Exercise per Week: 3 days    Minutes of Exercise per Session: 30 min  Stress: No Stress Concern Present (02/02/2021)   [place]    Feeling of Stress : Not at all  Social Connections: Moderately Integrated (02/02/2021)   Social Connection and Isolation Panel [NHANES]    Frequency of Communication with Friends and Family: Twice a week    Frequency of Social Gatherings with Friends and Family: Twice a week    Attends Religious Services: Never    Marine scientist or Organizations: Yes    Attends Music therapist: More than 4 times per year    Marital Status: Married    Current Medications:  Current Outpatient Medications:    senna-docusate (SENOKOT-S) 8.6-50 MG tablet, Take 2 tablets by mouth at bedtime. For AFTER surgery, do not take if having diarrhea, Disp: 30 tablet, Rfl: 0   traMADol (ULTRAM) 50 MG tablet, Take 1 tablet (50 mg total) by mouth every 12 (twelve) hours as needed for severe pain. For AFTER surgery only, do not take and drive, Disp: 10 tablet, Rfl: 0   Cholecalciferol (VITAMIN D) 2000 UNITS CAPS, Take 2,000 Units by mouth daily. Take one by mouth daily, Disp: , Rfl:    escitalopram (LEXAPRO) 10 MG tablet, Take 1 tablet (10 mg total) by mouth daily., Disp: 90 tablet, Rfl: 3   famotidine (PEPCID) 20 MG tablet, Take 20 mg by mouth 2 (two) times daily as needed for heartburn or indigestion., Disp: , Rfl:    magnesium 30 MG tablet, Take 30 mg by mouth daily., Disp: , Rfl:    OVER THE COUNTER MEDICATION, Apply 1 application  topically as needed (knee pain). CBD Cream-, Disp: , Rfl:    Turmeric 400 MG CAPS, Take 800 mg by mouth daily., Disp: , Rfl:    vitamin B-12 (CYANOCOBALAMIN) 1000 MCG tablet, Take 1,000 mcg by mouth daily at 2 PM., Disp: , Rfl:    Review of Systems: Denies appetite changes, fevers, chills, fatigue, unexplained weight changes. Denies hearing loss, neck lumps or masses, mouth sores, ringing in ears or voice changes. Denies cough or wheezing.  Denies shortness of breath. Denies chest pain or palpitations. Denies leg swelling. Denies abdominal distention, pain, blood in stools, constipation, nausea, vomiting, or early satiety. Denies pain with intercourse, dysuria, frequency, hematuria or incontinence. Denies hot flashes, pelvic pain, vaginal bleeding or vaginal discharge.   Denies joint  pain, back pain or muscle pain/cramps. Denies itching, rash, or wounds. Denies dizziness, headaches, numbness or seizures. Denies swollen lymph nodes or glands, denies easy bruising or bleeding. Denies anxiety, depression, confusion, or decreased concentration.  Physical Exam: BP 136/73 (BP Location: Left Arm, Patient Position: Sitting)   Pulse 82   Temp 99 F (37.2 C) (Oral)   Resp 17   Wt 146 lb 4 oz (66.3 kg)   SpO2 99%   BMI 28.56 kg/m  General: Alert, oriented, no acute distress. HEENT: Normocephalic, atraumatic. Chest: Unlabored breathing on room air.  Laboratory & Radiologic Studies: None new  Assessment & Plan: Kayla Mcgrath is a 76 y.o. woman with at least stage III uterine carcinosarcoma vs sarcoma.  Patient is recovering well after her recent hospitalization.  Will plan to get labs next week to check kidney function.  Reviewed again recent biopsy which is unable to give [place] a definitive diagnosis between sarcoma and carcinosarcoma.  Given this and the difference in treatment, I am recommending that we proceed with surgery with a goal of maximal tumor debulking.  We discussed the plan for a diagnostic laparoscopy, robotic versus open assisted hysterectomy, bilateral salpingo-oophorectomy, tumor debulking including lymph node dissection, mini-laparotomy.  Discussed plan for assessment at the start of surgery  regarding feasibility of doing a portion of the surgery in a minimally invasive manner.  Given the size of her uterus, she will require at least a mini laparotomy for specimen delivery.  The risks of surgery were discussed in detail and she understands these to include infection; wound separation; hernia; vaginal cuff separation, injury to adjacent organs such as bowel, bladder, blood vessels, ureters and nerves; bleeding which may require blood transfusion; anesthesia risk; thromboembolic events; possible death; unforeseen complications; possible need for re-exploration; medical complications such as heart attack, stroke, pleural effusion and pneumonia; and, if full lymphadenectomy is performed the risk of lymphedema and lymphocyst. The patient will receive DVT and antibiotic prophylaxis as indicated. She voiced a clear understanding. She had the opportunity to ask questions. Perioperative instructions were reviewed with her. Prescriptions for post-op medications were sent to her pharmacy of choice.  Will plan for 2 versus 4 weeks of postoperative DVT prophylaxis.  Will wait to make a decision about Lovenox versus oral anticoagulant after surgery pending patient's creatinine.  28 minutes of total time was spent for this patient encounter, including preparation, face-to-face counseling with the patient and coordination of care, and documentation of the encounter.  Jeral Pinch, MD  Division of Gynecologic Oncology  Department of Obstetrics and Gynecology  [facility]

## 2021-12-10 NOTE — Patient Instructions (Addendum)
Preparing for your Surgery  Plan for surgery on December 22, 2021 with Dr. Jeral Pinch at [facility] will be scheduled for robotic assisted total laparoscopic hysterectomy (removal of the uterus and cervix), bilateral salpingo-oophorectomy (removal of both ovaries and fallopian tubes), tumor debulking and lymph node dissection, laparotomy (larger incision on your abdomen).   Pre-operative Testing -You will receive a phone call from presurgical testing at [facility] to arrange for a pre-operative appointment and lab work.  -Bring your insurance card, copy of an advanced directive if applicable, medication list  -At that visit, you will be asked to sign a consent for a possible blood transfusion in case a transfusion becomes necessary during surgery.  The need for a blood transfusion is rare but having consent is a necessary part of your care.     -You should not be taking blood thinners or aspirin at least ten days prior to surgery unless instructed by your surgeon.  -STOP TUMERIC NOW. Do not take supplements such as fish oil (omega 3), red yeast rice, turmeric before your surgery. You want to avoid medications with aspirin in them including headache powders such as BC or Goody's), Excedrin migraine.  Day Before Surgery at [facility] will be asked to take in a light diet the day before surgery. You will be advised you can have clear liquids up until 3 hours before your surgery.    Eat a light diet the day before surgery.  Examples including soups, broths, toast, yogurt, mashed potatoes.  AVOID GAS PRODUCING FOODS. Things to avoid include carbonated beverages (fizzy beverages, sodas), raw fruits and raw vegetables (uncooked), or beans.   If your bowels are filled with gas, your surgeon will have difficulty visualizing your pelvic organs which increases your surgical risks.  Your role in recovery Your role is to become active as soon as directed by your doctor, while  still giving yourself time to heal.  Rest when you feel tired. You will be asked to do the following in order to speed your recovery:  - Cough and breathe deeply. This helps to clear and expand your lungs and can prevent pneumonia after surgery.  - [place]. Do mild physical activity. Walking or moving your legs help your circulation and body functions return to normal. Do not try to get up or walk alone the first time after surgery.   -If you develop swelling on one leg or the other, pain in the back of your leg, redness/warmth in one of your legs, please call the office or go to the Emergency Room to have a doppler to rule out a blood clot. For shortness of breath, chest pain-seek care in the Emergency Room as soon as possible. - Actively manage your pain. Managing your pain lets you move in comfort. We will ask you to rate your pain on a scale of zero to 10. It is your responsibility to tell your doctor or nurse where and how much you hurt so your pain can be treated.  Special Considerations -If you are diabetic, you may be placed on insulin after surgery to have closer control over your blood sugars to promote healing and recovery.  This does not mean that you will be discharged on insulin.  If applicable, your oral antidiabetics will be resumed when you are tolerating a solid diet.  -Your final pathology results from surgery should be available around one week after surgery and the results will be relayed  to you when available.  -FMLA forms can be faxed to [phone] and please allow 5-7 business days for completion.  Pain Management After Surgery -You have been prescribed your pain medication and bowel regimen medications before surgery so that you can have these available when you are discharged from the hospital. The pain medication is for use ONLY AFTER surgery and a new prescription will not be given.   -Make sure that you have Tylenol IF YOU ARE ABLE TO TAKE THESE  MEDICATION at home to use on a regular basis after surgery for pain control.   -Review the attached handout on narcotic use and their risks and side effects.   Bowel Regimen -You have been prescribed Sennakot-S to take nightly to prevent constipation especially if you are taking the narcotic pain medication intermittently.  It is important to prevent constipation and drink adequate amounts of liquids. You can stop taking this medication when you are not taking pain medication and you are back on your normal bowel routine.  Risks of Surgery Risks of surgery are low but include bleeding, infection, damage to surrounding structures, re-operation, blood clots, and very rarely death.   Blood Transfusion Information (For the consent to be signed before surgery)  We will be checking your blood type before surgery so in case of emergencies, we will know what type of blood you would need.                                            WHAT IS A BLOOD TRANSFUSION?  A transfusion is the replacement of blood or some of its parts. Blood is made up of multiple cells which provide different functions. Red blood cells carry oxygen and are used for blood loss replacement. White blood cells fight against infection. Platelets control bleeding. Plasma helps clot blood. Other blood products are available for specialized needs, such as hemophilia or other clotting disorders. BEFORE THE TRANSFUSION  Who gives blood for transfusions?  You may be able to donate blood to be used at a later date on yourself (autologous donation). Relatives can be asked to donate blood. This is generally not any safer than if you have received blood from a stranger. The same precautions are taken to ensure safety when a relative's blood is donated. Healthy volunteers who are fully evaluated to make sure their blood is safe. This is blood bank blood. Transfusion therapy is the safest it has ever been in the practice of medicine. Before  blood is taken from a donor, a complete history is taken to make sure that person has no history of diseases nor engages in risky social behavior (examples are intravenous drug use or sexual activity with multiple partners). The donor's travel history is screened to minimize risk of transmitting infections, such as malaria. The donated blood is tested for signs of infectious diseases, such as HIV and hepatitis. The blood is then tested to be sure it is compatible with you in order to minimize the chance of a transfusion reaction. If you or a relative donates blood, this is often done in anticipation of surgery and is not appropriate for emergency situations. It takes many days to process the donated blood. RISKS AND COMPLICATIONS Although transfusion therapy is very safe and saves many lives, the main dangers of transfusion include:  Getting an infectious disease. Developing a transfusion reaction. This is an allergic  reaction to something in the blood you were given. Every precaution is taken to prevent this. The decision to have a blood transfusion has been considered carefully by your caregiver before blood is given. Blood is not given unless the benefits outweigh the risks.  AFTER SURGERY INSTRUCTIONS  Return to work: 4-6 weeks if applicable  You will have a white honeycomb dressing over your larger incision. This dressing can be removed 5 days after surgery and you do not need to reapply a new dressing. Once you remove the dressing, you will notice that you have the surgical glue (dermabond) on the incision and this will peel off on its own. You can get this dressing wet in the shower the days after surgery prior to removal on the 5th day.   Activity: 1. Be up and out of the bed during the day.  Take a nap if needed.  You may walk up steps but be careful and use the hand rail.  Stair climbing will tire you more than you think, you may need to stop part way and rest.   2. No lifting or straining  for 6 weeks over 10 pounds. No pushing, pulling, straining for 6 weeks.  3. No driving for around 1 week(s).  Do not drive if you are taking narcotic pain medicine and make sure that your reaction time has returned.   4. You can shower as soon as the next day after surgery. Shower daily.  Use your regular soap and water (not directly on the incision) and pat your incision(s) dry afterwards; don't rub.  No tub baths or submerging your body in water until cleared by your surgeon. If you have the soap that was given to you by pre-surgical testing that was used before surgery, you do not need to use it afterwards because this can irritate your incisions.   5. No sexual activity and nothing in the vagina for 8-10 weeks.  6. You may experience a small amount of clear drainage from your incisions, which is normal.  If the drainage persists, increases, or changes color please call the office.  7. Do not use creams, lotions, or ointments such as neosporin on your incisions after surgery until advised by your surgeon because they can cause removal of the dermabond glue on your incisions.    8. You may experience vaginal spotting after surgery or around the 6-8 week mark from surgery when the stitches at the top of the vagina begin to dissolve.  The spotting is normal but if you experience heavy bleeding, call our office.  9. Take Tylenol first for pain if you are able to take these medications and only use narcotic pain medication for severe pain not relieved by the Tylenol.  Monitor your Tylenol intake to a max of 4,000 mg in a 24 hour period.   Diet: 1. Low sodium Heart Healthy Diet is recommended but you are cleared to resume your normal (before surgery) diet after your procedure.  2. It is safe to use a laxative, such as Miralax or Colace, if you have difficulty moving your bowels. You have been prescribed Sennakot-S to take at bedtime every evening after surgery to keep bowel movements regular and to  prevent constipation.    Wound Care: 1. Keep clean and dry.  Shower daily.  Reasons to call the Doctor: Fever - Oral temperature greater than 100.4 degrees Fahrenheit Foul-smelling vaginal discharge Difficulty urinating Nausea and vomiting Increased pain at the site of the incision that  is unrelieved with pain medicine. Difficulty breathing with or without chest pain New calf pain especially if only on one side Sudden, continuing increased vaginal bleeding with or without clots.   Contacts: For questions or concerns you should contact:  Dr. Jeral Pinch at [phone]  Joylene John, NP at [phone]  After Hours: call [phone] and have the GYN Oncologist paged/contacted (after 5 pm or on the weekends).  Messages sent via mychart are for non-urgent matters and are not responded to after hours so for urgent needs, please call the after hours number.

## 2021-12-14 ENCOUNTER — Ambulatory Visit (HOSPITAL_COMMUNITY): Payer: Medicare HMO

## 2021-12-14 ENCOUNTER — Encounter (HOSPITAL_COMMUNITY): Payer: Self-pay

## 2021-12-14 ENCOUNTER — Other Ambulatory Visit (HOSPITAL_COMMUNITY): Payer: Medicare HMO

## 2021-12-14 NOTE — Progress Notes (Signed)
COVID Vaccine Completed:  Yes  Date of COVID positive in last 90 days:  PCP -  Cardiologist -   Chest x-ray - CT chest 12-01-21 Epic EKG -  Stress Test -  ECHO -  Cardiac Cath -  Pacemaker/ICD device last checked: Spinal Cord Stimulator:  Bowel Prep -   Sleep Study -  CPAP -   Fasting Blood Sugar -  Checks Blood Sugar _____ times a day  Last dose of GLP1 agonist-  N/A GLP1 instructions:  N/A   Last dose of SGLT-2 inhibitors-  N/A SGLT-2 instructions: N/A   Blood Thinner Instructions: Aspirin Instructions: Last Dose:  Activity level:  Can go up a flight of stairs and perform activities of daily living without stopping and without symptoms of chest pain or shortness of breath.  Able to exercise without symptoms  Unable to go up a flight of stairs without symptoms of     Anesthesia review:   Patient denies shortness of breath, fever, cough and chest pain at PAT appointment  Patient verbalized understanding of instructions that were given to them at the PAT appointment. Patient was also instructed that they will need to review over the PAT instructions again at home before surgery.

## 2021-12-15 NOTE — Patient Instructions (Signed)
SURGICAL WAITING ROOM VISITATION Patients having surgery or a procedure may have no more than 2 support people in the waiting area - these visitors may rotate.   Children under the age of 61 must have an adult with them who is not the patient. If the patient needs to stay at the hospital during part of their recovery, the visitor guidelines for inpatient rooms apply. Pre-op nurse will coordinate an appropriate time for 1 support person to accompany patient in pre-op.  This support person may not rotate.    Please refer to the [facility] website for the visitor guidelines for Inpatients (after your surgery is over and you are in a regular room).      Your procedure is scheduled on: 12-22-21   Report to [facility] Main Entrance    Report to admitting at 12:45 PM   Call this number if you have problems the morning of surgery [phone]   Follow a light diet the day before surgery   Do not eat food :After Midnight.   After Midnight you may have the following liquids until 12:00 PM DAY OF SURGERY  Water Non-Citrus Juices (without pulp, NO RED) Carbonated Beverages Black Coffee (NO MILK/CREAM OR CREAMERS, sugar ok)  Clear Tea (NO MILK/CREAM OR CREAMERS, sugar ok) regular and decaf                             Plain Jell-O (NO RED)                                           Fruit ices (not with fruit pulp, NO RED)                                     Popsicles (NO RED)                                                               Sports drinks like Gatorade (NO RED)                      If you have questions, please contact your surgeon's office.   FOLLOW BOWEL PREP AND ANY ADDITIONAL PRE OP INSTRUCTIONS YOU RECEIVED FROM YOUR SURGEON'S OFFICE!!!     Oral Hygiene is also important to reduce your risk of infection.                                    Remember - BRUSH YOUR TEETH THE MORNING OF SURGERY WITH YOUR REGULAR TOOTHPASTE   Do NOT smoke after Midnight   Take these  medicines the morning of surgery with A SIP OF WATER:   Escitalopram  Famotidine  Tramadol if needed                              You may not have any metal on your body including hair pins, jewelry, and body piercing  Do not wear make-up, lotions, powders, perfumes or deodorant  Do not wear nail polish including gel and S&S, artificial/acrylic nails, or any other type of covering on natural nails including finger and toenails. If you have artificial nails, gel coating, etc. that needs to be removed by a nail salon please have this removed prior to surgery or surgery may need to be canceled/ delayed if the surgeon/ anesthesia feels like they are unable to be safely monitored.   Do not shave  48 hours prior to surgery.           Do not bring valuables to the hospital. [place].   Contacts, dentures or bridgework may not be worn into surgery.   Bring small overnight bag day of surgery.   DO NOT [place]. PHARMACY WILL DISPENSE MEDICATIONS LISTED ON YOUR MEDICATION LIST TO YOU DURING YOUR ADMISSION [place]!              Please read over the following fact sheets you were given: IF [place]  If you received a COVID test during your pre-op visit  it is requested that you wear a mask when out in public, stay away from anyone that may not be feeling well and notify your surgeon if you develop symptoms. If you test positive for Covid or have been in contact with anyone that has tested positive in the last 10 days please notify you surgeon.  [place] - Preparing for Surgery Before surgery, you can play an important role.  Because skin is not sterile, your skin needs to be as free of germs as possible.  You can reduce the number of germs on your skin by washing with CHG (chlorahexidine gluconate) soap before surgery.  CHG is an  antiseptic cleaner which kills germs and bonds with the skin to continue killing germs even after washing. Please DO NOT use if you have an allergy to CHG or antibacterial soaps.  If your skin becomes reddened/irritated stop using the CHG and inform your nurse when you arrive at Short Stay. Do not shave (including legs and underarms) for at least 48 hours prior to the first CHG shower.  You may shave your face/neck.  Please follow these instructions carefully:  1.  Shower with CHG Soap the night before surgery and the  morning of surgery.  2.  If you choose to wash your hair, wash your hair first as usual with your normal  shampoo.  3.  After you shampoo, rinse your hair and body thoroughly to remove the shampoo.                             4.  Use CHG as you would any other liquid soap.  You can apply chg directly to the skin and wash.  Gently with a scrungie or clean washcloth.  5.  Apply the CHG Soap to your body ONLY FROM THE NECK DOWN.   Do   not use on face/ open                           Wound or open sores. Avoid contact with eyes, ears mouth and   genitals (private parts).  Wash face,  Genitals (private parts) with your normal soap.             6.  Wash thoroughly, paying special attention to the area where your    surgery  will be performed.  7.  Thoroughly rinse your body with warm water from the neck down.  8.  DO NOT shower/wash with your normal soap after using and rinsing off the CHG Soap.                9.  Pat yourself dry with a clean towel.            10.  Wear clean pajamas.            11.  Place clean sheets on your bed the night of your first shower and do not  sleep with pets. Day of Surgery : Do not apply any lotions/deodorants the morning of surgery.  Please wear clean clothes to the hospital/surgery center.  FAILURE TO FOLLOW THESE INSTRUCTIONS MAY RESULT IN THE CANCELLATION OF YOUR SURGERY  PATIENT  SIGNATURE_________________________________  NURSE SIGNATURE__________________________________  ________________________________________________________________________   WHAT IS A BLOOD TRANSFUSION? Blood Transfusion Information  A transfusion is the replacement of blood or some of its parts. Blood is made up of multiple cells which provide different functions. Red blood cells carry oxygen and are used for blood loss replacement. White blood cells fight against infection. Platelets control bleeding. Plasma helps clot blood. Other blood products are available for specialized needs, such as hemophilia or other clotting disorders. BEFORE THE TRANSFUSION  Who gives blood for transfusions?  Healthy volunteers who are fully evaluated to make sure their blood is safe. This is blood bank blood. Transfusion therapy is the safest it has ever been in the practice of medicine. Before blood is taken from a donor, a complete history is taken to make sure that person has no history of diseases nor engages in risky social behavior (examples are intravenous drug use or sexual activity with multiple partners). The donor's travel history is screened to minimize risk of transmitting infections, such as malaria. The donated blood is tested for signs of infectious diseases, such as HIV and hepatitis. The blood is then tested to be sure it is compatible with you in order to minimize the chance of a transfusion reaction. If you or a relative donates blood, this is often done in anticipation of surgery and is not appropriate for emergency situations. It takes many days to process the donated blood. RISKS AND COMPLICATIONS Although transfusion therapy is very safe and saves many lives, the main dangers of transfusion include:  Getting an infectious disease. Developing a transfusion reaction. This is an allergic reaction to something in the blood you were given. Every precaution is taken to prevent this. The decision to  have a blood transfusion has been considered carefully by your caregiver before blood is given. Blood is not given unless the benefits outweigh the risks. AFTER THE TRANSFUSION Right after receiving a blood transfusion, you will usually feel much better and more energetic. This is especially true if your red blood cells have gotten low (anemic). The transfusion raises the level of the red blood cells which carry oxygen, and this usually causes an energy increase. The nurse administering the transfusion will monitor you carefully for complications. HOME CARE INSTRUCTIONS  No special instructions are needed after a transfusion. You may find your energy is better. Speak with your caregiver about any limitations on activity for underlying diseases  you may have. SEEK MEDICAL CARE IF:  Your condition is not improving after your transfusion. You develop redness or irritation at the intravenous (IV) site. SEEK IMMEDIATE MEDICAL CARE IF:  Any of the following symptoms occur over the next 12 hours: Shaking chills. You have a temperature by mouth above 102 F (38.9 C), not controlled by medicine. Chest, back, or muscle pain. People around you feel you are not acting correctly or are confused. Shortness of breath or difficulty breathing. Dizziness and fainting. You get a rash or develop hives. You have a decrease in urine output. Your urine turns a dark color or changes to pink, red, or brown. Any of the following symptoms occur over the next 10 days: You have a temperature by mouth above 102 F (38.9 C), not controlled by medicine. Shortness of breath. Weakness after normal activity. The white part of the eye turns yellow (jaundice). You have a decrease in the amount of urine or are urinating less often. Your urine turns a dark color or changes to pink, red, or brown. Document Released: 12/25/1999 Document Revised: 03/21/2011 Document Reviewed: 08/13/2007 [facility] Patient Information 2014  [place], [place].  _______________________________________________________________________

## 2021-12-16 ENCOUNTER — Other Ambulatory Visit: Payer: Medicare HMO

## 2021-12-16 ENCOUNTER — Encounter (HOSPITAL_COMMUNITY)
Admission: RE | Admit: 2021-12-16 | Discharge: 2021-12-16 | Disposition: A | Discharge status: Home / Self Care | Payer: Medicare HMO | Source: Ambulatory Visit | Attending: Gynecologic Oncology | Admitting: Gynecologic Oncology

## 2021-12-16 ENCOUNTER — Ambulatory Visit: Payer: Medicare HMO | Admitting: Hematology and Oncology

## 2021-12-16 ENCOUNTER — Ambulatory Visit: Payer: Medicare HMO

## 2021-12-16 ENCOUNTER — Encounter (HOSPITAL_COMMUNITY): Payer: Self-pay

## 2021-12-16 ENCOUNTER — Other Ambulatory Visit: Payer: Self-pay

## 2021-12-16 DIAGNOSIS — Z01812 Encounter for preprocedural laboratory examination: Secondary | ICD-10-CM | POA: Insufficient documentation

## 2021-12-16 DIAGNOSIS — C541 Malignant neoplasm of endometrium: Secondary | ICD-10-CM | POA: Insufficient documentation

## 2021-12-16 DIAGNOSIS — C549 Malignant neoplasm of corpus uteri, unspecified: Secondary | ICD-10-CM | POA: Diagnosis not present

## 2021-12-16 HISTORY — DX: Unspecified osteoarthritis, unspecified site: M19.90

## 2021-12-16 HISTORY — DX: Anxiety disorder, unspecified: F41.9

## 2021-12-16 HISTORY — DX: Anemia, unspecified: D64.9

## 2021-12-16 HISTORY — DX: Depression, unspecified: F32.A

## 2021-12-16 LAB — COMPREHENSIVE METABOLIC PANEL
ALT: 24 U/L (ref 0–44)
AST: 27 U/L (ref 15–41)
Albumin: 3 g/dL — ABNORMAL LOW (ref 3.5–5.0)
Alkaline Phosphatase: 85 U/L (ref 38–126)
Anion gap: 10 (ref 5–15)
BUN: 21 mg/dL (ref 8–23)
CO2: 22 mmol/L (ref 22–32)
Calcium: 8.8 mg/dL — ABNORMAL LOW (ref 8.9–10.3)
Chloride: 106 mmol/L (ref 98–111)
Creatinine, Ser: 1.16 mg/dL — ABNORMAL HIGH (ref 0.44–1.00)
GFR, Estimated: 49 mL/min — ABNORMAL LOW (ref >60)
Glucose, Bld: 92 mg/dL (ref 70–99)
Potassium: 4.3 mmol/L (ref 3.5–5.1)
Sodium: 138 mmol/L (ref 135–145)
Total Bilirubin: 0.8 mg/dL (ref 0.3–1.2)
Total Protein: 7.4 g/dL (ref 6.5–8.1)

## 2021-12-16 LAB — CBC
HCT: 33.3 % — ABNORMAL LOW (ref 36.0–46.0)
Hemoglobin: 10.5 g/dL — ABNORMAL LOW (ref 12.0–15.0)
MCH: 30 pg (ref 26.0–34.0)
MCHC: 31.5 g/dL (ref 30.0–36.0)
MCV: 95.1 fL (ref 80.0–100.0)
Platelets: 399 10*3/uL (ref 150–400)
RBC: 3.5 MIL/uL — ABNORMAL LOW (ref 3.87–5.11)
RDW: 15.8 % — ABNORMAL HIGH (ref 11.5–15.5)
WBC: 9.5 10*3/uL (ref 4.0–10.5)
nRBC: 0 % (ref 0.0–0.2)

## 2021-12-17 NOTE — Patient Instructions (Signed)
Preparing for your Surgery   Plan for surgery on December 22, 2021 with Dr. Jeral Pinch at [place] will be scheduled for robotic assisted total laparoscopic hysterectomy (removal of the uterus and cervix), bilateral salpingo-oophorectomy (removal of both ovaries and fallopian tubes), tumor debulking and lymph node dissection, laparotomy (larger incision on your abdomen).    Pre-operative Testing -You will receive a phone call from presurgical testing at [facility] to arrange for a pre-operative appointment and lab work.   -Bring your insurance card, copy of an advanced directive if applicable, medication list   -At that visit, you will be asked to sign a consent for a possible blood transfusion in case a transfusion becomes necessary during surgery.  The need for a blood transfusion is rare but having consent is a necessary part of your care.      -You should not be taking blood thinners or aspirin at least ten days prior to surgery unless instructed by your surgeon.   -STOP TUMERIC NOW. Do not take supplements such as fish oil (omega 3), red yeast rice, turmeric before your surgery. You want to avoid medications with aspirin in them including headache powders such as BC or Goody's), Excedrin migraine.   Day Before Surgery at [place] will be asked to take in a light diet the day before surgery. You will be advised you can have clear liquids up until 3 hours before your surgery.     Eat a light diet the day before surgery.  Examples including soups, broths, toast, yogurt, mashed potatoes.  AVOID GAS PRODUCING FOODS. Things to avoid include carbonated beverages (fizzy beverages, sodas), raw fruits and raw vegetables (uncooked), or beans.    If your bowels are filled with gas, your surgeon will have difficulty visualizing your pelvic organs which increases your surgical risks.   Your role in recovery Your role is to become active as soon as directed by your  doctor, while still giving yourself time to heal.  Rest when you feel tired. You will be asked to do the following in order to speed your recovery:   - Cough and breathe deeply. This helps to clear and expand your lungs and can prevent pneumonia after surgery.  - [place]. Do mild physical activity. Walking or moving your legs help your circulation and body functions return to normal. Do not try to get up or walk alone the first time after surgery.   -If you develop swelling on one leg or the other, pain in the back of your leg, redness/warmth in one of your legs, please call the office or go to the Emergency Room to have a doppler to rule out a blood clot. For shortness of breath, chest pain-seek care in the Emergency Room as soon as possible. - Actively manage your pain. Managing your pain lets you move in comfort. We will ask you to rate your pain on a scale of zero to 10. It is your responsibility to tell your doctor or nurse where and how much you hurt so your pain can be treated.   Special Considerations -If you are diabetic, you may be placed on insulin after surgery to have closer control over your blood sugars to promote healing and recovery.  This does not mean that you will be discharged on insulin.  If applicable, your oral antidiabetics will be resumed when you are tolerating a solid diet.   -Your final pathology results from surgery should  be available around one week after surgery and the results will be relayed to you when available.   -FMLA forms can be faxed to [phone] and please allow 5-7 business days for completion.   Pain Management After Surgery -You have been prescribed your pain medication and bowel regimen medications before surgery so that you can have these available when you are discharged from the hospital. The pain medication is for use ONLY AFTER surgery and a new prescription will not be given.    -Make sure that you have Tylenol IF YOU ARE  ABLE TO TAKE THESE MEDICATION at home to use on a regular basis after surgery for pain control.    -Review the attached handout on narcotic use and their risks and side effects.    Bowel Regimen -You have been prescribed Sennakot-S to take nightly to prevent constipation especially if you are taking the narcotic pain medication intermittently.  It is important to prevent constipation and drink adequate amounts of liquids. You can stop taking this medication when you are not taking pain medication and you are back on your normal bowel routine.   Risks of Surgery Risks of surgery are low but include bleeding, infection, damage to surrounding structures, re-operation, blood clots, and very rarely death.     Blood Transfusion Information (For the consent to be signed before surgery)   We will be checking your blood type before surgery so in case of emergencies, we will know what type of blood you would need.                                             WHAT IS A BLOOD TRANSFUSION?   A transfusion is the replacement of blood or some of its parts. Blood is made up of multiple cells which provide different functions. Red blood cells carry oxygen and are used for blood loss replacement. White blood cells fight against infection. Platelets control bleeding. Plasma helps clot blood. Other blood products are available for specialized needs, such as hemophilia or other clotting disorders. BEFORE THE TRANSFUSION  Who gives blood for transfusions?  You may be able to donate blood to be used at a later date on yourself (autologous donation). Relatives can be asked to donate blood. This is generally not any safer than if you have received blood from a stranger. The same precautions are taken to ensure safety when a relative's blood is donated. Healthy volunteers who are fully evaluated to make sure their blood is safe. This is blood bank blood. Transfusion therapy is the safest it has ever been in the  practice of medicine. Before blood is taken from a donor, a complete history is taken to make sure that person has no history of diseases nor engages in risky social behavior (examples are intravenous drug use or sexual activity with multiple partners). The donor's travel history is screened to minimize risk of transmitting infections, such as malaria. The donated blood is tested for signs of infectious diseases, such as HIV and hepatitis. The blood is then tested to be sure it is compatible with you in order to minimize the chance of a transfusion reaction. If you or a relative donates blood, this is often done in anticipation of surgery and is not appropriate for emergency situations. It takes many days to process the donated blood. RISKS AND COMPLICATIONS Although transfusion therapy is very  safe and saves many lives, the main dangers of transfusion include:  Getting an infectious disease. Developing a transfusion reaction. This is an allergic reaction to something in the blood you were given. Every precaution is taken to prevent this. The decision to have a blood transfusion has been considered carefully by your caregiver before blood is given. Blood is not given unless the benefits outweigh the risks.   AFTER SURGERY INSTRUCTIONS   Return to work: 4-6 weeks if applicable   You will have a white honeycomb dressing over your larger incision. This dressing can be removed 5 days after surgery and you do not need to reapply a new dressing. Once you remove the dressing, you will notice that you have the surgical glue (dermabond) on the incision and this will peel off on its own. You can get this dressing wet in the shower the days after surgery prior to removal on the 5th day.    Activity: 1. Be up and out of the bed during the day.  Take a nap if needed.  You may walk up steps but be careful and use the hand rail.  Stair climbing will tire you more than you think, you may need to stop part way and  rest.    2. No lifting or straining for 6 weeks over 10 pounds. No pushing, pulling, straining for 6 weeks.   3. No driving for around 1 week(s).  Do not drive if you are taking narcotic pain medicine and make sure that your reaction time has returned.    4. You can shower as soon as the next day after surgery. Shower daily.  Use your regular soap and water (not directly on the incision) and pat your incision(s) dry afterwards; don't rub.  No tub baths or submerging your body in water until cleared by your surgeon. If you have the soap that was given to you by pre-surgical testing that was used before surgery, you do not need to use it afterwards because this can irritate your incisions.    5. No sexual activity and nothing in the vagina for 8-10 weeks.   6. You may experience a small amount of clear drainage from your incisions, which is normal.  If the drainage persists, increases, or changes color please call the office.   7. Do not use creams, lotions, or ointments such as neosporin on your incisions after surgery until advised by your surgeon because they can cause removal of the dermabond glue on your incisions.     8. You may experience vaginal spotting after surgery or around the 6-8 week mark from surgery when the stitches at the top of the vagina begin to dissolve.  The spotting is normal but if you experience heavy bleeding, call our office.   9. Take Tylenol first for pain if you are able to take these medications and only use narcotic pain medication for severe pain not relieved by the Tylenol.  Monitor your Tylenol intake to a max of 4,000 mg in a 24 hour period.    Diet: 1. Low sodium Heart Healthy Diet is recommended but you are cleared to resume your normal (before surgery) diet after your procedure.   2. It is safe to use a laxative, such as Miralax or Colace, if you have difficulty moving your bowels. You have been prescribed Sennakot-S to take at bedtime every evening after  surgery to keep bowel movements regular and to prevent constipation.     Wound Care: 1.  Keep clean and dry.  Shower daily.   Reasons to call the Doctor: Fever - Oral temperature greater than 100.4 degrees Fahrenheit Foul-smelling vaginal discharge Difficulty urinating Nausea and vomiting Increased pain at the site of the incision that is unrelieved with pain medicine. Difficulty breathing with or without chest pain New calf pain especially if only on one side Sudden, continuing increased vaginal bleeding with or without clots.   Contacts: For questions or concerns you should contact:   Dr. Jeral Pinch at [phone]   Joylene John, NP at [phone]   After Hours: call [phone] and have the GYN Oncologist paged/contacted (after 5 pm or on the weekends).   Messages sent via mychart are for non-urgent matters and are not responded to after hours so for urgent needs, please call the after hours number.

## 2021-12-17 NOTE — Progress Notes (Signed)
Patient here with her daughter for follow up with Dr. Berline Lopes and for a pre-operative appointment prior to her scheduled surgery on December 22, 2021. She is scheduled for robotic assisted total laparoscopic hysterectomy, bilateral salpingo-oophorectomy, tumor debulking and lymph node dissection, laparotomy. The surgery was discussed in detail.  See after visit summary for additional details. Visual aids used to discuss items related to surgery.   Discussed post-op pain management in detail including the aspects of the enhanced recovery pathway.  Advised her that a new prescription would be sent in for tramadol and it is only to be used for after her upcoming surgery.  We discussed the use of tylenol post-op and to monitor for a maximum of 4,000 mg in a 24 hour period.  Also prescribed sennakot to be used after surgery and to hold if having loose stools.  Discussed bowel regimen in detail.     Discussed measures to take at home to prevent DVT including frequent mobility.  Reportable signs and symptoms of DVT discussed. Post-operative instructions discussed and expectations for after surgery. Incisional care discussed as well including reportable signs and symptoms including erythema, drainage, wound separation.     10 minutes spent with the patient and with preparing information.  Verbalizing understanding of material discussed. No needs or concerns voiced at the end of the visit.   Advised patient and family to call for any needs.  Advised that her post-operative medications had been prescribed and could be picked up at any time.    This appointment is included in the global surgical bundle as pre-operative teaching and has no charge.

## 2021-12-21 ENCOUNTER — Telehealth: Payer: Self-pay | Admitting: *Deleted

## 2021-12-21 NOTE — Telephone Encounter (Signed)
Left massage for pt to call me back regarding pre-op instructions

## 2021-12-21 NOTE — Telephone Encounter (Signed)
Telephone call to check on pre-operative status.  Patient compliant with pre-operative instructions.  Reinforced nothing to eat after midnight. Clear liquids until 12:00pm. Patient to arrive at 12:45.  No questions or concerns voiced.  Instructed to call for any needs.

## 2021-12-22 ENCOUNTER — Encounter (HOSPITAL_COMMUNITY): Payer: Self-pay | Admitting: Gynecologic Oncology

## 2021-12-22 ENCOUNTER — Encounter (HOSPITAL_COMMUNITY)
Admission: RE | Disposition: A | Discharge status: Home / Self Care | Payer: Self-pay | Source: Home / Self Care | Attending: Gynecologic Oncology

## 2021-12-22 ENCOUNTER — Inpatient Hospital Stay (HOSPITAL_COMMUNITY)
Admission: RE | Admit: 2021-12-22 | Discharge: 2021-12-23 | DRG: 740 | Disposition: A | Discharge status: Home / Self Care | Payer: Medicare HMO | Attending: Gynecologic Oncology | Admitting: Gynecologic Oncology

## 2021-12-22 ENCOUNTER — Other Ambulatory Visit: Payer: Self-pay

## 2021-12-22 ENCOUNTER — Inpatient Hospital Stay (HOSPITAL_COMMUNITY): Payer: Medicare HMO | Admitting: Certified Registered Nurse Anesthetist

## 2021-12-22 DIAGNOSIS — Z91040 Latex allergy status: Secondary | ICD-10-CM

## 2021-12-22 DIAGNOSIS — Z9011 Acquired absence of right breast and nipple: Secondary | ICD-10-CM

## 2021-12-22 DIAGNOSIS — C775 Secondary and unspecified malignant neoplasm of intrapelvic lymph nodes: Secondary | ICD-10-CM | POA: Diagnosis present

## 2021-12-22 DIAGNOSIS — Z886 Allergy status to analgesic agent status: Secondary | ICD-10-CM

## 2021-12-22 DIAGNOSIS — E785 Hyperlipidemia, unspecified: Secondary | ICD-10-CM | POA: Diagnosis not present

## 2021-12-22 DIAGNOSIS — F418 Other specified anxiety disorders: Secondary | ICD-10-CM | POA: Diagnosis present

## 2021-12-22 DIAGNOSIS — Z853 Personal history of malignant neoplasm of breast: Secondary | ICD-10-CM | POA: Diagnosis not present

## 2021-12-22 DIAGNOSIS — N179 Acute kidney failure, unspecified: Secondary | ICD-10-CM

## 2021-12-22 DIAGNOSIS — Z85828 Personal history of other malignant neoplasm of skin: Secondary | ICD-10-CM

## 2021-12-22 DIAGNOSIS — Z87891 Personal history of nicotine dependence: Secondary | ICD-10-CM

## 2021-12-22 DIAGNOSIS — Z79899 Other long term (current) drug therapy: Secondary | ICD-10-CM

## 2021-12-22 DIAGNOSIS — Z809 Family history of malignant neoplasm, unspecified: Secondary | ICD-10-CM

## 2021-12-22 DIAGNOSIS — K660 Peritoneal adhesions (postprocedural) (postinfection): Secondary | ICD-10-CM | POA: Diagnosis not present

## 2021-12-22 DIAGNOSIS — C541 Malignant neoplasm of endometrium: Secondary | ICD-10-CM | POA: Diagnosis not present

## 2021-12-22 DIAGNOSIS — C55 Malignant neoplasm of uterus, part unspecified: Secondary | ICD-10-CM | POA: Diagnosis present

## 2021-12-22 DIAGNOSIS — D539 Nutritional anemia, unspecified: Secondary | ICD-10-CM | POA: Diagnosis not present

## 2021-12-22 DIAGNOSIS — Z8249 Family history of ischemic heart disease and other diseases of the circulatory system: Secondary | ICD-10-CM | POA: Diagnosis not present

## 2021-12-22 DIAGNOSIS — Z9221 Personal history of antineoplastic chemotherapy: Secondary | ICD-10-CM | POA: Diagnosis not present

## 2021-12-22 DIAGNOSIS — Z811 Family history of alcohol abuse and dependence: Secondary | ICD-10-CM | POA: Diagnosis not present

## 2021-12-22 DIAGNOSIS — C549 Malignant neoplasm of corpus uteri, unspecified: Principal | ICD-10-CM | POA: Diagnosis present

## 2021-12-22 DIAGNOSIS — C548 Malignant neoplasm of overlapping sites of corpus uteri: Secondary | ICD-10-CM | POA: Diagnosis not present

## 2021-12-22 DIAGNOSIS — T8149XA Infection following a procedure, other surgical site, initial encounter: Secondary | ICD-10-CM

## 2021-12-22 HISTORY — PX: LYMPH NODE DISSECTION: SHX5087

## 2021-12-22 LAB — TYPE AND SCREEN
ABO/RH(D): O POS
Antibody Screen: NEGATIVE

## 2021-12-22 SURGERY — XI ROBOTIC ASSISTED TOTAL HYSTERECTOMY BILATERAL SALPINGO OOPHORECTOMY WITH OMENTECTOMY AND DEBULKING
Anesthesia: General

## 2021-12-22 MED ORDER — PHENYLEPHRINE HCL-NACL 20-0.9 MG/250ML-% IV SOLN
INTRAVENOUS | Status: AC
  Filled 2021-12-22: qty 250

## 2021-12-22 MED ORDER — ONDANSETRON HCL 4 MG/2ML IJ SOLN
INTRAMUSCULAR | Status: AC
  Filled 2021-12-22: qty 2

## 2021-12-22 MED ORDER — CEFAZOLIN SODIUM-DEXTROSE 2-4 GM/100ML-% IV SOLN
2.0000 g | INTRAVENOUS | Status: AC
  Administered 2021-12-22 (×2): 2 g via INTRAVENOUS
  Filled 2021-12-22: qty 100

## 2021-12-22 MED ORDER — ONDANSETRON HCL 4 MG/2ML IJ SOLN: 4.0000 mg | Freq: Four times a day (QID) | INTRAMUSCULAR | Status: DC | PRN

## 2021-12-22 MED ORDER — ONDANSETRON HCL 4 MG/2ML IJ SOLN
INTRAMUSCULAR | Status: DC | PRN
  Administered 2021-12-22: 4 mg via INTRAVENOUS

## 2021-12-22 MED ORDER — MIDAZOLAM HCL 5 MG/5ML IJ SOLN
INTRAMUSCULAR | Status: DC | PRN
  Administered 2021-12-22 (×2): 1 mg via INTRAVENOUS

## 2021-12-22 MED ORDER — LACTATED RINGERS IR SOLN
Status: DC | PRN
  Administered 2021-12-22: 1000 mL

## 2021-12-22 MED ORDER — PHENYLEPHRINE HCL-NACL 20-0.9 MG/250ML-% IV SOLN
INTRAVENOUS | Status: DC | PRN
  Administered 2021-12-22: 50 ug/min via INTRAVENOUS

## 2021-12-22 MED ORDER — LIDOCAINE 2% (20 MG/ML) 5 ML SYRINGE
INTRAMUSCULAR | Status: DC | PRN
  Administered 2021-12-22: 1.5 mg/kg/h via INTRAVENOUS
  Administered 2021-12-22: 60 mg via INTRAVENOUS

## 2021-12-22 MED ORDER — HEMOSTATIC AGENTS (NO CHARGE) OPTIME
TOPICAL | Status: DC | PRN
  Administered 2021-12-22: 1

## 2021-12-22 MED ORDER — ORAL CARE MOUTH RINSE: 15.0000 mL | Freq: Once | OROMUCOSAL | Status: AC

## 2021-12-22 MED ORDER — KCL IN DEXTROSE-NACL 20-5-0.45 MEQ/L-%-% IV SOLN
INTRAVENOUS | Status: DC
  Filled 2021-12-22: qty 1000

## 2021-12-22 MED ORDER — FENTANYL CITRATE PF 50 MCG/ML IJ SOSY
25.0000 ug | PREFILLED_SYRINGE | INTRAMUSCULAR | Status: DC | PRN
  Administered 2021-12-22: 25 ug via INTRAVENOUS

## 2021-12-22 MED ORDER — BUPIVACAINE LIPOSOME 1.3 % IJ SUSP
INTRAMUSCULAR | Status: DC | PRN
  Administered 2021-12-22: 20 mL

## 2021-12-22 MED ORDER — FAMOTIDINE 20 MG PO TABS
20.0000 mg | ORAL_TABLET | Freq: Every day | ORAL | Status: DC
  Administered 2021-12-23: 20 mg via ORAL
  Filled 2021-12-22: qty 1

## 2021-12-22 MED ORDER — BUPIVACAINE HCL 0.25 % IJ SOLN
INTRAMUSCULAR | Status: DC | PRN
  Administered 2021-12-22: 30 mL
  Administered 2021-12-22: 20 mL

## 2021-12-22 MED ORDER — OXYCODONE HCL 5 MG/5ML PO SOLN: 5.0000 mg | Freq: Once | ORAL | Status: DC | PRN

## 2021-12-22 MED ORDER — SENNOSIDES-DOCUSATE SODIUM 8.6-50 MG PO TABS
2.0000 | ORAL_TABLET | Freq: Every day | ORAL | Status: DC
  Administered 2021-12-22: 2 via ORAL
  Filled 2021-12-22: qty 2

## 2021-12-22 MED ORDER — LIDOCAINE HCL (PF) 2 % IJ SOLN
INTRAMUSCULAR | Status: AC
  Filled 2021-12-22: qty 5

## 2021-12-22 MED ORDER — FENTANYL CITRATE PF 50 MCG/ML IJ SOSY
PREFILLED_SYRINGE | INTRAMUSCULAR | Status: AC
  Filled 2021-12-22: qty 1

## 2021-12-22 MED ORDER — BUPIVACAINE HCL 0.25 % IJ SOLN
INTRAMUSCULAR | Status: AC
  Filled 2021-12-22: qty 1

## 2021-12-22 MED ORDER — CEFAZOLIN SODIUM 1 G IJ SOLR
INTRAMUSCULAR | Status: AC
  Filled 2021-12-22: qty 20

## 2021-12-22 MED ORDER — MIDAZOLAM HCL 2 MG/2ML IJ SOLN
INTRAMUSCULAR | Status: AC
  Filled 2021-12-22: qty 2

## 2021-12-22 MED ORDER — HEPARIN SODIUM (PORCINE) 5000 UNIT/ML IJ SOLN
5000.0000 [IU] | INTRAMUSCULAR | Status: AC
  Administered 2021-12-22: 5000 [IU] via SUBCUTANEOUS
  Filled 2021-12-22: qty 1

## 2021-12-22 MED ORDER — ESCITALOPRAM OXALATE 20 MG PO TABS
10.0000 mg | ORAL_TABLET | Freq: Every day | ORAL | Status: DC
  Administered 2021-12-23: 10 mg via ORAL
  Filled 2021-12-22: qty 1

## 2021-12-22 MED ORDER — BUPIVACAINE LIPOSOME 1.3 % IJ SUSP
INTRAMUSCULAR | Status: AC
  Filled 2021-12-22: qty 20

## 2021-12-22 MED ORDER — OXYCODONE HCL 5 MG PO TABS
5.0000 mg | ORAL_TABLET | ORAL | Status: DC | PRN
  Administered 2021-12-23 (×4): 5 mg via ORAL
  Filled 2021-12-22 (×4): qty 1

## 2021-12-22 MED ORDER — ROCURONIUM BROMIDE 10 MG/ML (PF) SYRINGE
PREFILLED_SYRINGE | INTRAVENOUS | Status: DC | PRN
  Administered 2021-12-22: 20 mg via INTRAVENOUS
  Administered 2021-12-22: 10 mg via INTRAVENOUS
  Administered 2021-12-22: 50 mg via INTRAVENOUS
  Administered 2021-12-22 (×2): 10 mg via INTRAVENOUS

## 2021-12-22 MED ORDER — HYDROMORPHONE HCL 1 MG/ML IJ SOLN: 0.5000 mg | INTRAMUSCULAR | Status: DC | PRN

## 2021-12-22 MED ORDER — LACTATED RINGERS IV SOLN: INTRAVENOUS | Status: DC | PRN

## 2021-12-22 MED ORDER — OXYCODONE HCL 5 MG PO TABS: 5.0000 mg | ORAL_TABLET | Freq: Once | ORAL | Status: DC | PRN

## 2021-12-22 MED ORDER — ROCURONIUM BROMIDE 10 MG/ML (PF) SYRINGE
PREFILLED_SYRINGE | INTRAVENOUS | Status: AC
  Filled 2021-12-22: qty 10

## 2021-12-22 MED ORDER — DEXAMETHASONE SODIUM PHOSPHATE 10 MG/ML IJ SOLN
INTRAMUSCULAR | Status: AC
  Filled 2021-12-22: qty 1

## 2021-12-22 MED ORDER — LIDOCAINE HCL (PF) 2 % IJ SOLN
INTRAMUSCULAR | Status: AC
  Filled 2021-12-22: qty 10

## 2021-12-22 MED ORDER — POVIDONE-IODINE 10 % EX SWAB
2.0000 | Freq: Once | CUTANEOUS | Status: AC
  Administered 2021-12-22: 2 via TOPICAL

## 2021-12-22 MED ORDER — PROPOFOL 10 MG/ML IV BOLUS
INTRAVENOUS | Status: DC | PRN
  Administered 2021-12-22: 50 mg via INTRAVENOUS
  Administered 2021-12-22: 90 mg via INTRAVENOUS

## 2021-12-22 MED ORDER — SUGAMMADEX SODIUM 200 MG/2ML IV SOLN
INTRAVENOUS | Status: DC | PRN
  Administered 2021-12-22: 200 mg via INTRAVENOUS

## 2021-12-22 MED ORDER — HYDROMORPHONE HCL 2 MG/ML IJ SOLN
INTRAMUSCULAR | Status: AC
  Filled 2021-12-22: qty 1

## 2021-12-22 MED ORDER — FENTANYL CITRATE (PF) 100 MCG/2ML IJ SOLN
INTRAMUSCULAR | Status: DC | PRN
  Administered 2021-12-22 (×2): 100 ug via INTRAVENOUS
  Administered 2021-12-22: 50 ug via INTRAVENOUS

## 2021-12-22 MED ORDER — HYDROMORPHONE HCL 1 MG/ML IJ SOLN
INTRAMUSCULAR | Status: DC | PRN
  Administered 2021-12-22: 1 mg via INTRAVENOUS

## 2021-12-22 MED ORDER — LACTATED RINGERS IV SOLN: INTRAVENOUS | Status: DC

## 2021-12-22 MED ORDER — CHLORHEXIDINE GLUCONATE 0.12 % MT SOLN
15.0000 mL | Freq: Once | OROMUCOSAL | Status: AC
  Administered 2021-12-22: 15 mL via OROMUCOSAL

## 2021-12-22 MED ORDER — ONDANSETRON HCL 4 MG PO TABS: 4.0000 mg | ORAL_TABLET | Freq: Four times a day (QID) | ORAL | Status: DC | PRN

## 2021-12-22 MED ORDER — ENOXAPARIN SODIUM 40 MG/0.4ML IJ SOSY
40.0000 mg | PREFILLED_SYRINGE | INTRAMUSCULAR | Status: DC
  Administered 2021-12-23: 40 mg via SUBCUTANEOUS
  Filled 2021-12-22: qty 0.4

## 2021-12-22 MED ORDER — PROPOFOL 10 MG/ML IV BOLUS
INTRAVENOUS | Status: AC
  Filled 2021-12-22: qty 20

## 2021-12-22 MED ORDER — TRAMADOL HCL 50 MG PO TABS: 100.0000 mg | ORAL_TABLET | Freq: Two times a day (BID) | ORAL | Status: DC | PRN

## 2021-12-22 MED ORDER — DEXAMETHASONE SODIUM PHOSPHATE 4 MG/ML IJ SOLN
4.0000 mg | INTRAMUSCULAR | Status: AC
  Administered 2021-12-22: 4 mg via INTRAVENOUS

## 2021-12-22 MED ORDER — FENTANYL CITRATE (PF) 250 MCG/5ML IJ SOLN
INTRAMUSCULAR | Status: AC
  Filled 2021-12-22: qty 5

## 2021-12-22 SURGICAL SUPPLY — 70 items
APPLICATOR ARISTA FLEXITIP XL (MISCELLANEOUS) IMPLANT
APPLICATOR SURGIFLO ENDO (HEMOSTASIS) IMPLANT
BAG LAPAROSCOPIC 12 15 PORT 16 (BASKET) IMPLANT
BAG RETRIEVAL 12/15 (BASKET) ×2
BLADE SURG SZ10 CARB STEEL (BLADE) IMPLANT
COVER BACK TABLE 60X90IN (DRAPES) ×2 IMPLANT
COVER TIP SHEARS 8 DVNC (MISCELLANEOUS) ×2 IMPLANT
COVER TIP SHEARS 8MM DA VINCI (MISCELLANEOUS) ×2
DERMABOND ADVANCED .7 DNX12 (GAUZE/BANDAGES/DRESSINGS) ×2 IMPLANT
DRAPE ARM DVNC X/XI (DISPOSABLE) ×8 IMPLANT
DRAPE COLUMN DVNC XI (DISPOSABLE) ×2 IMPLANT
DRAPE DA VINCI XI ARM (DISPOSABLE) ×8
DRAPE DA VINCI XI COLUMN (DISPOSABLE) ×2
DRAPE SHEET LG 3/4 BI-LAMINATE (DRAPES) ×2 IMPLANT
DRAPE SURG IRRIG POUCH 19X23 (DRAPES) ×2 IMPLANT
DRSG OPSITE POSTOP 4X6 (GAUZE/BANDAGES/DRESSINGS) IMPLANT
DRSG OPSITE POSTOP 4X8 (GAUZE/BANDAGES/DRESSINGS) IMPLANT
DRSG TEGADERM 6X8 (GAUZE/BANDAGES/DRESSINGS) IMPLANT
ELECT PENCIL ROCKER SW 15FT (MISCELLANEOUS) IMPLANT
ELECT REM PT RETURN 15FT ADLT (MISCELLANEOUS) ×2 IMPLANT
GAUZE 4X4 16PLY ~~LOC~~+RFID DBL (SPONGE) ×4 IMPLANT
GLOVE BIOGEL PI IND STRL 7.0 (GLOVE) IMPLANT
GLOVE SURG SS PI 6.0 STRL IVOR (GLOVE) IMPLANT
GLOVE SURG SS PI 6.5 STRL IVOR (GLOVE) IMPLANT
GOWN STRL REUS W/ TWL LRG LVL3 (GOWN DISPOSABLE) ×8 IMPLANT
GOWN STRL REUS W/TWL LRG LVL3 (GOWN DISPOSABLE) ×10
GRASPER SUT TROCAR 14GX15 (MISCELLANEOUS) IMPLANT
HEMOSTAT ARISTA ABSORB 3G PWDR (HEMOSTASIS) IMPLANT
HOLDER FOLEY CATH W/STRAP (MISCELLANEOUS) IMPLANT
IRRIG SUCT STRYKERFLOW 2 WTIP (MISCELLANEOUS) ×2
IRRIGATION SUCT STRKRFLW 2 WTP (MISCELLANEOUS) ×2 IMPLANT
KIT TURNOVER KIT A (KITS) IMPLANT
LIGASURE IMPACT 36 18CM CVD LR (INSTRUMENTS) IMPLANT
MANIPULATOR ADVINCU DEL 3.0 PL (MISCELLANEOUS) IMPLANT
MANIPULATOR ADVINCU DEL 3.5 PL (MISCELLANEOUS) IMPLANT
MANIPULATOR UTERINE 4.5 ZUMI (MISCELLANEOUS) IMPLANT
NDL HYPO 21X1.5 SAFETY (NEEDLE) ×2 IMPLANT
NEEDLE HYPO 21X1.5 SAFETY (NEEDLE) ×4 IMPLANT
OBTURATOR OPTICAL STANDARD 8MM (TROCAR) ×2
OBTURATOR OPTICAL STND 8 DVNC (TROCAR) ×2
OBTURATOR OPTICALSTD 8 DVNC (TROCAR) ×2 IMPLANT
PACK ROBOT GYN CUSTOM WL (TRAY / TRAY PROCEDURE) ×2 IMPLANT
PAD POSITIONING PINK XL (MISCELLANEOUS) ×2 IMPLANT
PORT ACCESS TROCAR AIRSEAL 12 (TROCAR) IMPLANT
SEAL CANN UNIV 5-8 DVNC XI (MISCELLANEOUS) ×6 IMPLANT
SEAL XI 5MM-8MM UNIVERSAL (MISCELLANEOUS) ×8
SET TRI-LUMEN FLTR TB AIRSEAL (TUBING) ×2 IMPLANT
SOL PREP POV-IOD 4OZ 10% (MISCELLANEOUS) ×4 IMPLANT
SPIKE FLUID TRANSFER (MISCELLANEOUS) ×2 IMPLANT
SPONGE T-LAP 18X18 ~~LOC~~+RFID (SPONGE) IMPLANT
SURGIFLO W/THROMBIN 8M KIT (HEMOSTASIS) IMPLANT
SUT MNCRL AB 4-0 PS2 18 (SUTURE) IMPLANT
SUT PDS AB 1 TP1 96 (SUTURE) IMPLANT
SUT V-LOC 180 0-0 GS22 (SUTURE) IMPLANT
SUT VIC AB 0 CT1 27 (SUTURE)
SUT VIC AB 0 CT1 27XBRD ANTBC (SUTURE) IMPLANT
SUT VIC AB 2-0 CT1 27 (SUTURE) ×2
SUT VIC AB 2-0 CT1 TAPERPNT 27 (SUTURE) IMPLANT
SUT VIC AB 4-0 PS2 18 (SUTURE) ×4 IMPLANT
SUT VLOC 180 0 9IN  GS21 (SUTURE) ×2
SUT VLOC 180 0 9IN GS21 (SUTURE) IMPLANT
SYS BAG RETRIEVAL 10MM (BASKET) ×2
SYS WOUND ALEXIS 18CM MED (MISCELLANEOUS)
SYSTEM BAG RETRIEVAL 10MM (BASKET) IMPLANT
SYSTEM WOUND ALEXIS 18CM MED (MISCELLANEOUS) IMPLANT
TOWEL OR NON WOVEN STRL DISP B (DISPOSABLE) IMPLANT
TRAP SPECIMEN MUCUS 40CC (MISCELLANEOUS) IMPLANT
UNDERPAD 30X36 HEAVY ABSORB (UNDERPADS AND DIAPERS) ×4 IMPLANT
WATER STERILE IRR 1000ML POUR (IV SOLUTION) ×2 IMPLANT
YANKAUER SUCT BULB TIP 10FT TU (MISCELLANEOUS) IMPLANT

## 2021-12-22 NOTE — Anesthesia Preprocedure Evaluation (Signed)
Anesthesia Evaluation  Patient identified by MRN, date of birth, ID band Patient awake    Reviewed: Allergy & Precautions, H&P , NPO status , Patient's Chart, lab work & pertinent test results  History of Anesthesia Complications (+) PONV and history of anesthetic complications  Airway Mallampati: II   Neck ROM: full    Dental   Pulmonary former smoker   breath sounds clear to auscultation       Cardiovascular negative cardio ROS  Rhythm:regular Rate:Normal     Neuro/Psych  PSYCHIATRIC DISORDERS Anxiety Depression       GI/Hepatic   Endo/Other    Renal/GU      Musculoskeletal  (+) Arthritis ,    Abdominal   Peds  Hematology   Anesthesia Other Findings   Reproductive/Obstetrics Uterine CA                             Anesthesia Physical Anesthesia Plan  ASA: 2  Anesthesia Plan: General   Post-op Pain Management:    Induction: Intravenous  PONV Risk Score and Plan: 4 or greater and Ondansetron, Dexamethasone, Midazolam and Treatment may vary due to age or medical condition  Airway Management Planned: Oral ETT  Additional Equipment:   Intra-op Plan:   Post-operative Plan: Extubation in OR  Informed Consent: I have reviewed the patients History and Physical, chart, labs and discussed the procedure including the risks, benefits and alternatives for the proposed anesthesia with the patient or authorized representative who has indicated his/her understanding and acceptance.     Dental advisory given  Plan Discussed with: CRNA, Anesthesiologist and Surgeon  Anesthesia Plan Comments:        Anesthesia Quick Evaluation

## 2021-12-22 NOTE — Anesthesia Procedure Notes (Signed)
Procedure Name: Intubation Date/Time: 12/22/2021 3:12 PM  Performed by: Montel Clock, CRNAPre-anesthesia Checklist: Patient identified, Emergency Drugs available, Suction available, Patient being monitored and Timeout performed Patient Re-evaluated:Patient Re-evaluated prior to induction Oxygen Delivery Method: Circle system utilized Preoxygenation: Pre-oxygenation with 100% oxygen Induction Type: IV induction Ventilation: Mask ventilation without difficulty and Oral airway inserted - appropriate to patient size Laryngoscope Size: Mac and 3 Grade View: Grade I Tube type: Oral Tube size: 7.0 mm Number of attempts: 1 Airway Equipment and Method: Stylet Placement Confirmation: ETT inserted through vocal cords under direct vision, positive ETCO2 and breath sounds checked- equal and bilateral Secured at: 21 cm Tube secured with: Tape Dental Injury: Teeth and Oropharynx as per pre-operative assessment

## 2021-12-22 NOTE — Interval H&P Note (Signed)
History and Physical Interval Note:  12/22/2021 2:32 PM  Kayla Mcgrath  has presented today for surgery, with the diagnosis of UTERINE CANCER.  The various methods of treatment have been discussed with the patient and family. After consideration of risks, benefits and other options for treatment, the patient has consented to  Procedure(s): XI ROBOTIC ASSISTED TOTAL HYSTERECTOMY BILATERAL SALPINGO OOPHORECTOMY WITH TUMOR DEBULKING, MINI LAPAROTOMY (Bilateral) LYMPH NODE DISSECTION (N/A) as a surgical intervention.  The patient's history has been reviewed, patient examined, no change in status, stable for surgery.  I have reviewed the patient's chart and labs.  Questions were answered to the patient's satisfaction.     Lafonda Mosses

## 2021-12-22 NOTE — Discharge Instructions (Signed)
AFTER SURGERY INSTRUCTIONS   Return to work: 4-6 weeks if applicable   You will have a white honeycomb dressing over your larger incision. This dressing can be removed 5 days after surgery and you do not need to reapply a new dressing. Once you remove the dressing, you will notice that you have the surgical glue (dermabond) on the incision and this will peel off on its own. You can get this dressing wet in the shower the days after surgery prior to removal on the 5th day.   You have been prescribed Eliquis 2.5 mg twice daily to prevent blood clots after surgery for 2 weeks. Plan to start this tomorrow, 12/24/21, in the am. YOU NEED TO STAY WELL HYDRATED WHILE TAKING THIS MEDICATION. THIS IS A BLOOD THINNER SO YOU NEED TO CALL THE OFFICE RIGHT AWAY FOR ABNORMAL BLEEDING.   Activity: 1. Be up and out of the bed during the day.  Take a nap if needed.  You may walk up steps but be careful and use the hand rail.  Stair climbing will tire you more than you think, you may need to stop part way and rest.    2. No lifting or straining for 6 weeks over 10 pounds. No pushing, pulling, straining for 6 weeks.   3. No driving for around 1 week(s).  Do not drive if you are taking narcotic pain medicine and make sure that your reaction time has returned.    4. You can shower as soon as the next day after surgery. Shower daily.  Use your regular soap and water (not directly on the incision) and pat your incision(s) dry afterwards; don't rub.  No tub baths or submerging your body in water until cleared by your surgeon. If you have the soap that was given to you by pre-surgical testing that was used before surgery, you do not need to use it afterwards because this can irritate your incisions.    5. No sexual activity and nothing in the vagina for 8-10 weeks.   6. You may experience a small amount of clear drainage from your incisions, which is normal.  If the drainage persists, increases, or changes color please  call the office.   7. Do not use creams, lotions, or ointments such as neosporin on your incisions after surgery until advised by your surgeon because they can cause removal of the dermabond glue on your incisions.     8. You may experience vaginal spotting after surgery or around the 6-8 week mark from surgery when the stitches at the top of the vagina begin to dissolve.  The spotting is normal but if you experience heavy bleeding, call our office.   9. Take Tylenol first for pain if you are able to take these medications and only use narcotic pain medication for severe pain not relieved by the Tylenol.  Monitor your Tylenol intake to a max of 4,000 mg in a 24 hour period.    Diet: 1. Low sodium Heart Healthy Diet is recommended but you are cleared to resume your normal (before surgery) diet after your procedure.   2. It is safe to use a laxative, such as Miralax or Colace, if you have difficulty moving your bowels. You have been prescribed Sennakot-S to take at bedtime every evening after surgery to keep bowel movements regular and to prevent constipation.     Wound Care: 1. Keep clean and dry.  Shower daily.   Reasons to call the Doctor: Fever -  Oral temperature greater than 100.4 degrees Fahrenheit Foul-smelling vaginal discharge Difficulty urinating Nausea and vomiting Increased pain at the site of the incision that is unrelieved with pain medicine. Difficulty breathing with or without chest pain New calf pain especially if only on one side Sudden, continuing increased vaginal bleeding with or without clots.   Contacts: For questions or concerns you should contact:   Dr. Jeral Pinch at [phone]   Joylene John, NP at [phone]   After Hours: call [phone] and have the GYN Oncologist paged/contacted (after 5 pm or on the weekends).   Messages sent via mychart are for non-urgent matters and are not responded to after hours so for urgent needs, please call the  after hours number.

## 2021-12-22 NOTE — Brief Op Note (Signed)
12/22/2021  7:24 PM  PATIENT:  Kayla Mcgrath  76 y.o. female  PRE-OPERATIVE DIAGNOSIS:  UTERINE CANCER  POST-OPERATIVE DIAGNOSIS:  UTERINE CANCER  PROCEDURE:  Procedure(s): XI ROBOTIC ASSISTED TOTAL HYSTERECTOMY BILATERAL SALPINGO OOPHORECTOMY WITH TUMOR DEBULKING, MINI LAPAROTOMY;CYSTOSCOPY (Bilateral) LYMPH NODE DISSECTION (N/A)  SURGEON:  Surgeon(s) and Role:    * Lafonda Mosses, MD - Primary    Lahoma Crocker, MD - Assisting  ANESTHESIA:   general  EBL:  200 mL   BLOOD ADMINISTERED:none  DRAINS: none   LOCAL MEDICATIONS USED:  exparel, marcaine  SPECIMEN:  uterus,cervix, left cervical margin, tubes, ovaries, high left common iliac lymph node  DISPOSITION OF SPECIMEN:  PATHOLOGY  COUNTS:  YES  TOURNIQUET:  * No tourniquets in log *  DICTATION: .Note written in EPIC  PLAN OF CARE: Admit for overnight observation  PATIENT DISPOSITION:  PACU - hemodynamically stable.   Delay start of Pharmacological VTE agent (>24hrs) due to surgical blood loss or risk of bleeding: not applicable

## 2021-12-22 NOTE — Op Note (Signed)
OPERATIVE NOTE  Pre-operative Diagnosis: High grade uterine cancer, sarcoma versus carcinosarcoma  Post-operative Diagnosis: same, at least stage III uterine cancer, pelvic nodes densely adherent to the external iliac vessels  Operation: Robotic-assisted laparoscopic total hysterectomy with bilateral salpingo-oophorectomy, tumor debulking including high common iliac lymph node excision, aborted attempt of bilateral external iliac enlarged lymph nodes, mini-lap for specimen delivery, cystoscopy  Surgeon: Jeral Pinch MD  Assistant Surgeon: Lahoma Crocker MD (an MD assistant was necessary for tissue manipulation, management of robotic instrumentation, retraction and positioning due to the complexity of the case and hospital policies).   Anesthesia: GET  Urine Output: 300cc  Operative Findings: On EUA, markedly enlarged moderately mobile uterus. Cervix 4-5 cm dilated with effaced but normal cervix. Large, necrotic appearing polypoid tumor prolapsing through the cervix (some debulked). Bulbous lower uterine segment and cervix. On intra-abdominal entry, normal appearing upper abdominal survey. Normal omentum, small and large bowel. No ascites. Uterus 16-18cm, bulky and boggy. Mildly enlarged bilateral ovaries. Fallopian tubes normal in appearance. 4 cm enlarged left external iliac lymph node and 3 cm enlarged right external iliac lymph node, both densely adherent to the external iliac artery and vein. 2 cm enlarged high left common/para-aortic lymph node. No other obvious adenopathy.  Bilateral external iliac lymph nodes left in situ given anticipated morbidity. No other evidence of disease. On cystoscopy, bladder dome intact, good efflux noted from bilateral ureteral orifices. Along the lateral wall of the left bladder, a 5 mm exophytic lesion was noted. There was no corresponding intra-abdominal evidence of tumor at this location.  Estimated Blood Loss:  200cc      Total IV Fluids: see  I&O flowsheet         Specimens: uterus, cervix, left cervical margin, bilateral tubes and ovaries, high left common iliac lymph node         Complications:  None apparent; patient tolerated the procedure well.         Disposition: PACU - hemodynamically stable.  Procedure Details  The patient was seen in the Holding Room. The risks, benefits, complications, treatment options, and expected outcomes were discussed with the patient.  The patient concurred with the proposed plan, giving informed consent.  The site of surgery properly noted/marked. The patient was identified as Kayla Mcgrath and the procedure verified as a Robotic-assisted hysterectomy with bilateral salpingo oophorectomy, tumor debulking.   After induction of anesthesia, the patient was draped and prepped in the usual sterile manner. Patient was placed in supine position after anesthesia and draped and prepped in the usual sterile manner as follows: Her arms were tucked to her side with all appropriate precautions.  The patient was secured to the bed using padding and tape across her chest.  The patient was placed in the semi-lithotomy position in [place].  The perineum and vagina were prepped with Betadine. The patient's abdomen was prepped with ChloraPrep and then she was draped after the prep had been allowed to dry for 3 minutes.  A Time Out was held and the above information confirmed.  The urethra was prepped with Betadine. Foley catheter was placed.  A sterile speculum was placed in the vagina.  The prolapsing polypoid tissue was grasped and somewhat debulked. Given difficulty with passing dilator past the internal cervical os, decision made to use a medium KOH on a ringed forcep to help identifying the cervicovaginal junction. OG tube placement was confirmed and to suction.   Next, a 10 mm skin incision was made 1 cm below  the subcostal margin in the midclavicular line.  The 5 mm Optiview port and scope was used for direct  entry.  Opening pressure was under 10 mm CO2.  The abdomen was insufflated and the findings were noted as above.   At this point and all points during the procedure, the patient's intra-abdominal pressure did not exceed 15 mmHg. Next, an 8 mm skin incision was made superior to the umbilicus and a right and left port were placed about 8 cm lateral to the robot port on the right and left side.  A fourth arm was placed on the right.  The 5 mm assist trocar was exchanged for a 10-12 mm port. All ports were placed under direct visualization.  The patient was placed in steep Trendelenburg.  Bowel was already noted to be in the upper abdomen.  The robot was docked in the normal manner.  The right and left peritoneum were opened parallel to the IP ligament to open the retroperitoneal spaces bilaterally. The round ligaments were transected. The ureter was noted to be on the medial leaf of the broad ligament.  The peritoneum above the ureter was incised and stretched and the infundibulopelvic ligament was skeletonized, cauterized and cut.    The posterior peritoneum was taken down to the level of the KOH ring.  The anterior peritoneum was also taken down with the use of a 30 degree scope.  The bladder flap was created to below the level of the KOH ring.  The uterine artery on the right side was skeletonized, cauterized and cut in the normal manner.  A similar procedure was performed on the left.  The colpotomy was made and the uterus, cervix, bilateral ovaries and tubes were amputated, placed in an Endocatch bag to be delivered at the time of mini-lap.  Pedicles were inspected and excellent hemostasis was achieved.  Some margin of the cervix was noted along the posterior left aspect of the cuff. This was excised and sent as left cervical margin.  Attention was then turned to the retroperitoneum. On the left, the medial leaf of the broad ligament was held medially.  On the side was noted to be densely adherent to the  ureter.  Sharp dissection was used to slowly mobilized the ureter and broad ligament free from the node.  Attention was then superiorly and the adventitia was cleared over the node itself.  I could not identify the node as separate from the adjacent external iliac artery and common iliac artery.  Similarly, at the inferior aspect, the firm node was densely adherent to the external iliac vein.  With caution, in several locations, I attempted to carefully identify a plane between the artery and the lymph node without success.  Thus, attention was then turned to the right.  A similar procedure was performed, mobilizing the ureter medially from the lymph node.  Here, the lymph node itself was densely adherent to the external iliac vein rather than the artery.  I attempted this dissection slowly, using short burst of electrocautery on the surface of the node itself to slowly mobilize the node from the external iliac vein.  During this dissection, as I moved more inferiorly along the vein surface, this plane became almost indistinguishable.  Given concern for metabolic external iliac vein injury in the setting of contralateral node that I could not debulk, decision made to abandon this procedure.  Attention was then turned to the high left common iliac/low para-aortic lymph node that appeared enlarged.  The peritoneum  adjacent to this node was incised.  The node itself was in close proximity to the inferior mesenteric artery.  Using careful sharp dissection as well as short bursts of monopolar electrocautery, this node was excised.  It was placed in an Endo Catch bag to later be delivered through the mini lap incision.  The colpotomy at the vaginal cuff was closed with 0 Vicryl on a CT1 needle with a figure of eight stitch at each apex and a 0 V-Loc to close the midportion of the cuff in a running manner.  Irrigation was used and excellent hemostasis was achieved.  Arista was placed in bilateral pelvic basins as well as  at the location of the common iliac/para-aortic lymph node excision. At this point in the procedure was completed.  Robotic instruments were removed under direct visulaization.  The robot was undocked.   The bladder was instilled with 180 cc of sterile fluid and the foley catheter remove. Cystoscopy was performed with findings as above. Foley catheter was replaced.  The supraumbilical trocar was removed and the incision extended 8-10 cm with a scalpel. The incision was carried down to and through the fascia, with the abdomen insufflated, using monopolar electrocautery. The peritoneal incision was extended under direct visualization. The endocatch bag with the uterine specimen was delivered through the incision. The incision was then closed with running #1 looped PDS tied in the midline. After this closure, suture was noted to be loose at the inferior aspect of the incision. The inferior stitch was excised an a new #1 looped PDS was used to close the inferior aspect of the incision. The subcutaneous tissue was irrigated and hemostasis achieved. Exparel was injected for local anesthesia.   The fascia at the 10-12 mm port was closed with 0 Vicryl on a UR-5 needle.  The subcuticular tissue of all incisions was closed with 4-0 Vicryl and the skin was closed with 4-0 Monocryl in a subcuticular manner.  Dermabond was applied.    The vagina was swabbed with minimal bleeding noted. All sponge, lap and needle counts were correct x  3.   The patient was transferred to the recovery room in stable condition.  Jeral Pinch, MD

## 2021-12-22 NOTE — Transfer of Care (Signed)
Immediate Anesthesia Transfer of Care Note  Patient: Kayla Mcgrath  Procedure(s) Performed: Procedure(s): XI ROBOTIC ASSISTED TOTAL HYSTERECTOMY BILATERAL SALPINGO OOPHORECTOMY WITH TUMOR DEBULKING, MINI LAPAROTOMY;CYSTOSCOPY (Bilateral) LYMPH NODE DISSECTION (N/A)  Patient Location: PACU  Anesthesia Type:General  Level of Consciousness: Alert, Awake, Oriented  Airway & Oxygen Therapy: Patient Spontanous Breathing  Post-op Assessment: Report given to RN  Post vital signs: Reviewed and stable  Last Vitals:  Vitals:   12/22/21 1258  BP: (!) 148/82  Pulse: 94  Resp: 18  Temp: 36.6 C  SpO2: 10%    Complications: No apparent anesthesia complications

## 2021-12-23 ENCOUNTER — Encounter: Payer: Self-pay | Admitting: Oncology

## 2021-12-23 ENCOUNTER — Encounter (HOSPITAL_COMMUNITY): Payer: Self-pay | Admitting: Gynecologic Oncology

## 2021-12-23 ENCOUNTER — Encounter: Payer: Self-pay | Admitting: Gynecologic Oncology

## 2021-12-23 LAB — BASIC METABOLIC PANEL
Anion gap: 9 (ref 5–15)
BUN: 18 mg/dL (ref 8–23)
CO2: 24 mmol/L (ref 22–32)
Calcium: 8.4 mg/dL — ABNORMAL LOW (ref 8.9–10.3)
Chloride: 103 mmol/L (ref 98–111)
Creatinine, Ser: 1.35 mg/dL — ABNORMAL HIGH (ref 0.44–1.00)
GFR, Estimated: 41 mL/min — ABNORMAL LOW (ref >60)
Glucose, Bld: 144 mg/dL — ABNORMAL HIGH (ref 70–99)
Potassium: 4.9 mmol/L (ref 3.5–5.1)
Sodium: 136 mmol/L (ref 135–145)

## 2021-12-23 LAB — CBC
HCT: 29 % — ABNORMAL LOW (ref 36.0–46.0)
Hemoglobin: 9.1 g/dL — ABNORMAL LOW (ref 12.0–15.0)
MCH: 29.6 pg (ref 26.0–34.0)
MCHC: 31.4 g/dL (ref 30.0–36.0)
MCV: 94.5 fL (ref 80.0–100.0)
Platelets: 281 10*3/uL (ref 150–400)
RBC: 3.07 MIL/uL — ABNORMAL LOW (ref 3.87–5.11)
RDW: 15.9 % — ABNORMAL HIGH (ref 11.5–15.5)
WBC: 12.7 10*3/uL — ABNORMAL HIGH (ref 4.0–10.5)
nRBC: 0 % (ref 0.0–0.2)

## 2021-12-23 LAB — CREATININE, SERUM
Creatinine, Ser: 1.29 mg/dL — ABNORMAL HIGH (ref 0.44–1.00)
GFR, Estimated: 43 mL/min — ABNORMAL LOW (ref >60)

## 2021-12-23 MED ORDER — APIXABAN 2.5 MG PO TABS: 2.5000 mg | ORAL_TABLET | Freq: Two times a day (BID) | ORAL | 0 refills | Status: DC

## 2021-12-23 NOTE — Plan of Care (Signed)
Patient is stable for discharge. Discharge instructions have been given. All questions answered, patient is discharged home with daughter.

## 2021-12-23 NOTE — Progress Notes (Signed)
1 Day Post-Op Procedure(s) (LRB): XI ROBOTIC ASSISTED TOTAL HYSTERECTOMY BILATERAL SALPINGO OOPHORECTOMY WITH TUMOR DEBULKING, MINI LAPAROTOMY;CYSTOSCOPY (Bilateral) LYMPH NODE DISSECTION (N/A)  Subjective: Patient reports sleeping well last pm. Reports pain relief with tramadol. Rating pain at a 5 currently. No nausea or emesis reported. Has not taken in moderate amount of liquids or solid food. No flatus reported. No concerns voiced.  Objective: Vital signs in last 24 hours: Temp:  [97.7 F (36.5 C)-98.5 F (36.9 C)] 98.1 F (36.7 C) (12/14 0623) Pulse Rate:  [86-108] 91 (12/14 0623) Resp:  [12-19] 18 (12/14 0623) BP: (124-160)/(67-117) 124/77 (12/14 0623) SpO2:  [92 %-100 %] 98 % (12/14 0623) Weight:  [145 lb 6.4 oz (66 kg)] 145 lb 6.4 oz (66 kg) (12/13 1301)    Intake/Output from previous day: 12/13 0701 - 12/14 0700 In: 2568.1 [P.O.:360; I.V.:2008.1; IV Piggyback:200] Out: 2229 [Urine:1575; Blood:200]  Physical Examination: General: alert, cooperative, and no distress Resp: clear to auscultation bilaterally Cardio: regular rate and rhythm, S1, S2 normal, no murmur, click, rub or gallop GI: soft, non-tender; bowel sounds normal; no masses,  no organomegaly and incision: lap sites incisions to the abdomen with dermabond intact, mini laparotomy incision with op site dressing in place with no drainage underneath Extremities: extremities normal, atraumatic, no cyanosis or edema Foley in place with clear, yellow urine  Labs: WBC/Hgb/Hct/Plts:  12.7/9.1/29.0/281 (12/14 0532) BUN/Cr/glu/ALT/AST/amyl/lip:  18/1.35/--/--/--/--/-- (12/14 0532)  Assessment: 76 y.o. s/p Procedure(s): XI ROBOTIC ASSISTED TOTAL HYSTERECTOMY BILATERAL SALPINGO OOPHORECTOMY WITH TUMOR DEBULKING, MINI LAPAROTOMY;CYSTOSCOPY LYMPH NODE DISSECTION: stable Pain:  Pain is well-controlled on PRN medications.  Heme: Hgb 9.1 and Hct 29.0 this am- appropriate given preop values and surgical losses.  ID: WBC 12.7  this am. Given decadron intra-op. No evidence of infection.  CV: BP and HR stable. Continue to monitor while inpatient.  GI:  Tolerating po: Yes, sips of liquids. Antiemetics ordered prn.  GU: Creatinine 1.35 this am-around baseline. Adequate urine from foley.    FEN: No critical values this am.  Prophylaxis: Lovenox ordered and SCDs on.  Plan: Diet as tolerated Foley removal this am Increase mobility If meeting milestones later today, plan for probable discharge Continue plan of care Home DVT prop to be discussed with Dr. Alvy Bimler. Plan for 2 weeks.   LOS: 1 day    Dorothyann Gibbs 12/23/2021, 7:24 AM

## 2021-12-23 NOTE — TOC CM/SW Note (Signed)
Transition of Care [facility]) Screening Note  Patient Details  Name: Kayla Mcgrath Date of Birth: 31-May-1945  Transition of [facility]) CM/SW Contact:    Sherie Don, LCSW Phone Number: 12/23/2021, 10:57 AM  Transition of Care Department [facility]) has reviewed patient and no TOC needs have been identified at this time. We will continue to monitor patient advancement through interdisciplinary progression rounds. If new patient transition needs arise, please place a TOC consult.

## 2021-12-23 NOTE — Progress Notes (Signed)
Removed foley cath per protocol. Cath has no inclusions. Pt tolerated without difficulty. Prior to removal, emptied 353m of clear yellow urine.

## 2021-12-23 NOTE — Progress Notes (Signed)
Met with Kayla Mcgrath and her daughter on [place] my role as Gyn Advertising copywriter and provided my contact information.  Discussed her upcoming appointments and Jon asked about scheduling an appointment for port placement.  Advised I will check on scheduling it and get back to her.  Also advised that her co pay for Eliquis will be $21.00.  She verbalized understanding and knows to call with any questions or needs.

## 2021-12-23 NOTE — Discharge Summary (Signed)
Physician Discharge Summary  Patient ID: Kayla Mcgrath MRN: [id number] DOB/AGE: [birth year] 76 y.o.  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    Hospital Course: On 12/22/2021, the patient underwent the following: Procedure(s): XI ROBOTIC ASSISTED TOTAL HYSTERECTOMY BILATERAL SALPINGO OOPHORECTOMY WITH TUMOR DEBULKING, MINI LAPAROTOMY;CYSTOSCOPY LYMPH NODE DISSECTION. The postoperative course was uneventful.  She was discharged to home on postoperative day 1 tolerating a regular diet, ambulating, pain controlled, voiding.   Consults: None  Significant Diagnostic Studies: Am labs  Treatments: surgery: see above  Discharge Exam: Blood pressure (!) 115/59, pulse 77, temperature 98.3 F (36.8 C), temperature source Oral, resp. rate 16, height '4\' 11"'$  (1.499 m), weight 145 lb 6.4 oz (66 kg), SpO2 96 %. Am assessment: General: alert, cooperative, and no distress Resp: clear to auscultation bilaterally Cardio: regular rate and rhythm, S1, S2 normal, no murmur, click, rub or gallop GI: soft, non-tender; bowel sounds normal; no masses,  no organomegaly and incision: lap sites incisions to the abdomen with dermabond intact, mini laparotomy incision with op site dressing in place with no drainage underneath Extremities: extremities normal, atraumatic, no cyanosis or edema  Disposition: Discharge disposition: 01-Home or Self Care       Discharge Instructions     Call MD for:  difficulty breathing, headache or visual disturbances   Complete by: As directed    Call MD for:  extreme fatigue   Complete by: As directed    Call MD for:  hives   Complete by: As directed    Call MD for:  persistant dizziness or light-headedness    Complete by: As directed    Call MD for:  persistant nausea and vomiting   Complete by: As directed    Call MD for:  redness, tenderness, or signs of infection (pain, swelling, redness, odor or green/yellow discharge around incision site)   Complete by: As directed    Call MD for:  severe uncontrolled pain   Complete by: As directed    Call MD for:  temperature >100.4   Complete by: As directed    Diet - low sodium heart healthy   Complete by: As directed    Discharge wound care:   Complete by: As directed    You will have a white honeycomb dressing over your larger incision. This dressing can be removed 5 days after surgery and you do not need to reapply a new dressing. Once you remove the dressing, you will notice that you have the surgical glue (dermabond) on the incision and this will peel off on its own. You can get this dressing wet in the shower the days after surgery prior to removal on the 5th day.   Driving Restrictions   Complete by: As directed    No driving for around 1 week(s).  Do not take narcotics and drive. You need to make sure your reaction time has returned.   Increase activity slowly   Complete by: As directed    Lifting restrictions   Complete by: As directed    No lifting greater than 10 lbs, pushing, pulling, straining for 6 weeks.   Sexual Activity Restrictions   Complete by: As directed    No sexual activity, nothing in the vagina, for 8-10 weeks.  Allergies as of 12/23/2021       Reactions   Tylenol [acetaminophen] Hives, Swelling   Lip swelling   Latex Rash        Medication List     STOP taking these medications    Turmeric 400 MG Caps       TAKE these medications    apixaban 2.5 MG Tabs tablet Commonly known as: Eliquis Take 1 tablet (2.5 mg total) by mouth 2 (two) times daily. For AFTER surgery, plan to start taking 12/24/21 in the am Start taking on: December 24, 2021   cyanocobalamin 1000 MCG tablet Commonly known as:  VITAMIN B12 Take 1,000 mcg by mouth daily at 2 PM.   escitalopram 10 MG tablet Commonly known as: LEXAPRO Take 1 tablet (10 mg total) by mouth daily.   famotidine 20 MG tablet Commonly known as: PEPCID Take 20 mg by mouth 2 (two) times daily as needed for heartburn or indigestion.   magnesium 30 MG tablet Take 30 mg by mouth daily.   OVER THE COUNTER MEDICATION Apply 1 application  topically as needed (knee pain). CBD Cream-   senna-docusate 8.6-50 MG tablet Commonly known as: Senokot-S Take 2 tablets by mouth at bedtime. For AFTER surgery, do not take if having diarrhea   traMADol 50 MG tablet Commonly known as: ULTRAM Take 1 tablet (50 mg total) by mouth every 12 (twelve) hours as needed for severe pain. For AFTER surgery only, do not take and drive   Vitamin D 50 MCG (2000 UT) Caps Take 2,000 Units by mouth daily. Take one by mouth daily               Discharge Care Instructions  (From admission, onward)           Start     Ordered   12/23/21 0000  Discharge wound care:       Comments: You will have a white honeycomb dressing over your larger incision. This dressing can be removed 5 days after surgery and you do not need to reapply a new dressing. Once you remove the dressing, you will notice that you have the surgical glue (dermabond) on the incision and this will peel off on its own. You can get this dressing wet in the shower the days after surgery prior to removal on the 5th day.   12/23/21 1527            Follow-up Information     Lafonda Mosses, MD Follow up on 12/29/2021.   Specialty: Gynecologic Oncology Why: at 4:35pm will be a PHONE visit with Dr. Berline Lopes to check in and discuss pathology. IN PERSON visit will be on 01/20/22 at 1:45 pm at the [facility]. Contact information: [place] [address] [phone]                 Greater than thirty minutes were spend for face to face discharge instructions and  discharge orders/summary in EPIC.   Signed: Dorothyann Gibbs 12/23/2021, 3:29 PM

## 2021-12-24 ENCOUNTER — Telehealth: Payer: Self-pay | Admitting: Surgery

## 2021-12-24 NOTE — Anesthesia Postprocedure Evaluation (Signed)
Anesthesia Post Note  Patient: Kayla Mcgrath  Procedure(s) Performed: XI ROBOTIC ASSISTED TOTAL HYSTERECTOMY BILATERAL SALPINGO OOPHORECTOMY WITH TUMOR DEBULKING, MINI LAPAROTOMY;CYSTOSCOPY (Bilateral) LYMPH NODE DISSECTION     Patient location during evaluation: PACU Anesthesia Type: General Level of consciousness: awake and alert Pain management: pain level controlled Vital Signs Assessment: post-procedure vital signs reviewed and stable Respiratory status: spontaneous breathing, nonlabored ventilation, respiratory function stable and patient connected to nasal cannula oxygen Cardiovascular status: blood pressure returned to baseline and stable Postop Assessment: no apparent nausea or vomiting Anesthetic complications: no   No notable events documented.  Last Vitals:  Vitals:   12/23/21 0938 12/23/21 1328  BP: 124/70 (!) 115/59  Pulse: 77 77  Resp: 17 16  Temp: 36.7 C 36.8 C  SpO2: 95% 96%    Last Pain:  Vitals:   12/23/21 1652  TempSrc:   PainSc: [place]

## 2021-12-24 NOTE — Telephone Encounter (Signed)
Spoke with Kayla Mcgrath this morning. She states she is eating, drinking and urinating well. She has not had a BM yet but is passing gas. She is taking senokot as prescribed and encouraged her to drink plenty of water. She denies fever or chills. Incisions are dry and intact. She rates her pain 4/10. Her pain is controlled with Tramadol. Patient confirmed her husband is in the process of picking up her Eliquis prescription and she will begin taking it today.     Instructed to call office with any fever, chills, purulent drainage, uncontrolled pain or any other questions or concerns. Patient verbalizes understanding.   Pt aware of post op appointments as well as the office number [phone] and after hours number [phone] to call if she has any questions or concerns

## 2021-12-27 ENCOUNTER — Telehealth: Payer: Self-pay | Admitting: Oncology

## 2021-12-27 NOTE — Telephone Encounter (Signed)
Called [place] and advised her of port placement on 01/12/2022 with arrival at 11:30 at [place] her instructions for NPO after 7 am that morning and to have a driver and someone to stay with her 24 hours afterwards.  She verbalized understanding and knows to call with any questions.

## 2021-12-29 ENCOUNTER — Encounter: Payer: Self-pay | Admitting: Gynecologic Oncology

## 2021-12-29 ENCOUNTER — Inpatient Hospital Stay (HOSPITAL_BASED_OUTPATIENT_CLINIC_OR_DEPARTMENT_OTHER): Payer: Medicare HMO | Admitting: Gynecologic Oncology

## 2021-12-29 DIAGNOSIS — C549 Malignant neoplasm of corpus uteri, unspecified: Secondary | ICD-10-CM

## 2021-12-29 DIAGNOSIS — Z7189 Other specified counseling: Secondary | ICD-10-CM

## 2021-12-29 DIAGNOSIS — Z90710 Acquired absence of both cervix and uterus: Secondary | ICD-10-CM

## 2021-12-29 DIAGNOSIS — Z90722 Acquired absence of ovaries, bilateral: Secondary | ICD-10-CM

## 2021-12-29 NOTE — Progress Notes (Signed)
Gynecologic Oncology Telehealth Note: Gyn-Onc  I connected with Kayla Mcgrath on 12/29/21 at  4:35 PM EST by telephone and verified that I am speaking with the correct person using two identifiers.  I discussed the limitations, risks, security and privacy concerns of performing an evaluation and management service by telemedicine and the availability of in-person appointments. I also discussed with the patient that there may be a patient responsible charge related to this service. The patient expressed understanding and agreed to proceed.  Other persons participating in the visit and their role in the encounter: none.  Patient's location: home Provider's location: [facility]  Reason for Visit: follow-up after surgery  Treatment History: Oncology History Overview Note  MMR normal PD-L1 CPS: 1%   Carcinosarcoma of body of uterus ([place])  09/23/2021 Initial Diagnosis   She presented with PMB   09/28/2021 Imaging   US pelvis Enlarged uterus with fibroids. Pelvic sonogram is otherwise unremarkable.    11/11/2021 Pathology Results   A. CERVIX, MASS, EXCISION:  - HIGH-GRADE UNDIFFERENTIATED MALIGNANCY WITH EXTENSIVE NECROSIS (SEE COMMENT).   COMMENT:  The tumor consists of mostly high-grade sarcomatoid morphology with focal epithelioid morphology.  Immunohistochemical stains are performed on block A4.  The tumor cells are positive for desmin and p16 while negative for CD45, CK7, CK20, chromogranin, synaptophysin, Melan-A, p40, AE1/AE3, SMA, p63, cyclin D1, S100 and smooth muscle myosin.  Ki-67 proliferation index is high.  CD10 staining shows focal nonspecific staining.  The differential diagnosis includes a high-grade sarcoma and a partially sampled carcinosarcoma.    11/25/2021 Imaging   MRI pelvis Marked distention of the entire endometrial cavity by heterogeneously enhancing soft tissue density, which extends through the endocervical canal into the lower vagina. This is highly suspicious for  endometrial carcinoma.   Diffuse myometrial thinning due to marked distention of endometrial cavity limits evaluation; deep myometrial invasion cannot be excluded in the uterine fundus.   No evidence of extra-uterine extension of tumor.   Lymphadenopathy in lower abdominal retroperitoneum, bilateral iliac chains, and sigmoid mesocolon, consistent with metastatic disease.   Sigmoid diverticulosis. No radiographic evidence of diverticulitis.   11/26/2021 Initial Diagnosis   Uterine cancer ([place])   11/26/2021 Cancer Staging   Staging form: Corpus Uteri - Carcinoma and Carcinosarcoma, AJCC 8th Edition - Clinical stage from 11/26/2021: FIGO Stage IIIC2 (cT3, cN2, cM0) - Signed by Heath Lark, MD on 11/26/2021 Stage prefix: Initial diagnosis Histologic grade (G): G3 Histologic grading system: 3 grade system   11/30/2021 Pathology Results   FINAL MICROSCOPIC DIAGNOSIS:  A. UTERINE CONTENTS, BIOPSY: - HIGH-GRADE UNDIFFERENTIATED MALIGNANCY WITH EXTENSIVE NECROSIS (SEE COMMENT).  COMMENT: The patient's history of a high-grade undifferentiated malignancy with extensive necrosis of the cervix is noted.  The morphological features of the current tumor cells are similar to the tumor cells seen in the previous cervical mass excision.  Immunohistochemical staining for desmin and cytokeratin AE1/AE3 is performed on block A1.  The tumor cells are diffusely positive for desmin.   AE1/AE3 stain is focally positive, however this is scant and of unclear clinical significance. Given the small quantity of tissue in the current biopsy, a definitive distinction between a high-grade sarcoma and carcinosarcoma cannot be made as this biopsy may not be representative of the entire tumor. If clinically indicated, this case as well as the previous cervical mass excision ([id number]) may be sent out for expert consultation. Clinical correlation recommended.     12/01/2021 Tumor Marker   Patient's tumor was tested  for the following markers:  CA-125. Results of the tumor marker test revealed 491.   12/16/2021 - 12/16/2021 Chemotherapy   Patient is on Treatment Plan : UTERINE Carboplatin AUC 6 + Paclitaxel q21d     12/22/2021 Surgery   Robotic-assisted laparoscopic total hysterectomy with bilateral salpingo-oophorectomy, tumor debulking including high common iliac lymph node excision, aborted attempt of bilateral external iliac enlarged lymph nodes, mini-lap for specimen delivery, cystoscopy      Interval History: Moving around more. Pain controlled, no medications the last few days. Denies vaginal bleeding. Normal bowel movements with Miralax. Gas pain finally improved. Denies urinary symptoms.  Past Medical/Surgical History: Past Medical History:  Diagnosis Date   Adjustment disorder with anxious mood 12/05/2018   Anemia    Anxiety    Arthralgia 06/29/2015   Arthritis    Breast cancer ([place]) 2010   Colon polyps    ?   Complication of anesthesia    DCIS (ductal carcinoma in situ) of breast 2010   Depression    Diverticulosis    Full dentures    Hyperlipidemia    Joint pain    Leukocytosis 07/11/2017   PONV (postoperative nausea and vomiting)    Woke up during surgery   Skin cancer    SCC   Wears glasses     Past Surgical History:  Procedure Laterality Date   BREAST BIOPSY Right 05/11/2012   BREAST BIOPSY Right 04/24/2012   BREAST LUMPECTOMY Right 12/2008   cancer-tamoxifen   BREAST RECONSTRUCTION WITH PLACEMENT OF TISSUE EXPANDER AND FLEX HD (ACELLULAR HYDRATED DERMIS) Right 06/20/2012   Procedure: BREAST RECONSTRUCTION WITH PLACEMENT OF TISSUE EXPANDER AND FLEX HD (ACELLULAR HYDRATED DERMIS);  Surgeon: Theodoro Kos, DO;  Location: [place];  Service: Plastics;  Laterality: Right;   BREAST REDUCTION WITH MASTOPEXY Left 10/11/2012   Procedure: LEFT BREAST REDUCTION WITH MASTOPEXY;  Surgeon: Theodoro Kos, DO;  Location: [place];  Service:  Plastics;  Laterality: Left;   cataract sugery Bilateral 03/2019   COLONOSCOPY  08/28/2013   [place]   DILATION AND CURETTAGE OF UTERUS     heavy bleeding between two pregnancies   LIPOSUCTION WITH LIPOFILLING Left 10/11/2012   Procedure: LIPOSUCTION WITH LIPOFILLING;  Surgeon: Theodoro Kos, DO;  Location: [place];  Service: Plastics;  Laterality: Left;   LYMPH NODE DISSECTION N/A 12/22/2021   Procedure: LYMPH NODE DISSECTION;  Surgeon: Lafonda Mosses, MD;  Location: [facility];  Service: Gynecology;  Laterality: N/A;   MASTECTOMY Right 06/20/2012   MASTECTOMY W/ SENTINEL NODE BIOPSY Right 06/20/2012   Procedure: right skin sparing MASTECTOMY WITH SENTINEL LYMPH NODE BIOPSY;  Surgeon: Stark Klein, MD;  Location: [place];  Service: General;  Laterality: Right;   PARATHYROIDECTOMY Left 11/07/2017   Procedure: NECK EXPLORATION WITH LEFT THYROID LOBECTOMY;  Surgeon: [name], MD;  Location: [facility];  Service: General;  Laterality: Left;   POLYPECTOMY     REDUCTION MAMMAPLASTY Left 10/2012   REMOVAL OF TISSUE EXPANDER AND PLACEMENT OF IMPLANT Right 10/11/2012   Procedure: REMOVAL OF TISSUE EXPANDER AND PLACEMENT OF SILICONE IMPLANT RIGHT;  Surgeon: Theodoro Kos, DO;  Location: [place];  Service: Plastics;  Laterality: Right;    Family History  Problem Relation Age of Onset   Alcohol abuse Mother    Colon polyps Mother    Mental illness Mother    Alcohol abuse Father        suicide   Mental illness Father    Cancer Sister  gyn cancer   Heart disease Paternal Grandfather    Colon cancer Neg Hx    Esophageal cancer Neg Hx    Rectal cancer Neg Hx    Stomach cancer Neg Hx    Breast cancer Neg Hx    Ovarian cancer Neg Hx    Endometrial cancer Neg Hx    Pancreatic cancer Neg Hx    Prostate cancer Neg Hx     Social History   Socioeconomic History   Marital status: Married    Spouse name: Not on file   Number of  children: 2   Years of education: Not on file   Highest education level: Not on file  Occupational History   Occupation: retired LPN  Tobacco Use   Smoking status: Former    Types: Cigarettes    Quit date: 06/14/1981    Years since quitting: 40.5   Smokeless tobacco: Never  Vaping Use   Vaping Use: Never used  Substance and Sexual Activity   Alcohol use: No   Drug use: No   Sexual activity: Not Currently    Birth control/protection: Post-menopausal  Other Topics Concern   Not on file  Social History Narrative   Recently moved from [place] to be closer to her two daughters and grandchildren.   Retired Corporate treasurer.   DNR- forms singed on 05/07/2012 and returned to pt.   Social Determinants of Health   Financial Resource Strain: Low Risk  (02/02/2021)   Overall Financial Resource Strain (CARDIA)    Difficulty of Paying Living Expenses: Not hard at all  Food Insecurity: No Food Insecurity (11/30/2021)   Hunger Vital Sign    Worried About Running Out of Food in the Last Year: Never true    Ran Out of Food in the Last Year: Never true  Transportation Needs: No Transportation Needs (11/30/2021)   PRAPARE - Hydrologist (Medical): No    Lack of Transportation (Non-Medical): No  Physical Activity: Insufficiently Active (02/02/2021)   Exercise Vital Sign    Days of Exercise per Week: 3 days    Minutes of Exercise per Session: 30 min  Stress: No Stress Concern Present (02/02/2021)   [place]    Feeling of Stress : Not at all  Social Connections: Moderately Integrated (02/02/2021)   Social Connection and Isolation Panel [NHANES]    Frequency of Communication with Friends and Family: Twice a week    Frequency of Social Gatherings with Friends and Family: Twice a week    Attends Religious Services: Never    Marine scientist or Organizations: Yes    Attends Music therapist: More than 4  times per year    Marital Status: Married    Current Medications:  Current Outpatient Medications:    apixaban (ELIQUIS) 2.5 MG TABS tablet, Take 1 tablet (2.5 mg total) by mouth 2 (two) times daily. For AFTER surgery, plan to start taking 12/24/21 in the am, Disp: 28 tablet, Rfl: 0   Cholecalciferol (VITAMIN D) 2000 UNITS CAPS, Take 2,000 Units by mouth daily. Take one by mouth daily, Disp: , Rfl:    escitalopram (LEXAPRO) 10 MG tablet, Take 1 tablet (10 mg total) by mouth daily., Disp: 90 tablet, Rfl: 3   famotidine (PEPCID) 20 MG tablet, Take 20 mg by mouth 2 (two) times daily as needed for heartburn or indigestion., Disp: , Rfl:    magnesium 30 MG tablet, Take 30 mg  by mouth daily., Disp: , Rfl:    OVER THE COUNTER MEDICATION, Apply 1 application  topically as needed (knee pain). CBD Cream-, Disp: , Rfl:    senna-docusate (SENOKOT-S) 8.6-50 MG tablet, Take 2 tablets by mouth at bedtime. For AFTER surgery, do not take if having diarrhea, Disp: 30 tablet, Rfl: 0   traMADol (ULTRAM) 50 MG tablet, Take 1 tablet (50 mg total) by mouth every 12 (twelve) hours as needed for severe pain. For AFTER surgery only, do not take and drive, Disp: 10 tablet, Rfl: 0   vitamin B-12 (CYANOCOBALAMIN) 1000 MCG tablet, Take 1,000 mcg by mouth daily at 2 PM., Disp: , Rfl:   Review of Symptoms: Pertinent positives as per HPI.  Physical Exam: Deferred given limitations of phone visit.  Laboratory & Radiologic Studies: A. LEFT CERVICAL MARGIN, EXCISION: - Positive for carcinoma  B. HIGH LEFT COMMON ILIAC LYMPH NODE, EXCISION: - Metastatic carcinosarcoma involving the lymph node  C. UTERUS, CERVIX, BILATERAL FALLOPIAN TUBES AND OVARIES, RESECTION: - Metastatic carcinosarcoma (Mixed Malignant Mullerian Tumor), 13.2 cm, including undifferentiated sarcoma with rhabdoid features and high-grade serous carcinoma, see comment - Tumor invades for a depth of 1 mm where myometrial thickness is 5 mm - Benign  bilateral fallopian tubes and ovaries - See oncology table - See comment    COMMENT:  Dr. Saralyn Pilar reviewed the case and concurs with the diagnosis.     ONCOLOGY TABLE:  UTERUS, CARCINOMA OR CARCINOSARCOMA: Resection  Procedure: Total hysterectomy and bilateral salpingo-oophorectomy Histologic Type: Carcinosarcoma (mixed malignant Mullerian tumor) Histologic Grade: High-grade Myometrial Invasion:      Depth of Myometrial Invasion (mm): 1 mm      Myometrial Thickness (mm): 5 mm      Percentage of Myometrial Invasion: 20% Uterine Serosa Involvement: Not identified Cervical stromal Involvement: Present Extent of involvement of other tissue/organs: Not identified Peritoneal/Ascitic Fluid: Not applicable Lymphovascular Invasion: Present Regional Lymph Nodes:      Pelvic Lymph Nodes Examined:                                  0 Sentinel                                  1 Non-sentinel                                  1 Total      Pelvic Lymph Nodes with Metastasis: 1                          Macrometastasis: (>2.0 mm): 1                          Micrometastasis: (>0.2 mm and < 2.0 mm): 0                          Isolated Tumor Cells (<0.2 mm): 0                          Laterality of Lymph Node with Tumor: Left  Extracapsular Extension: Not identified      Para-aortic Lymph Nodes Examined:                                   0 Sentinel                                   0 non-sentinel                                   0 total Distant Metastasis:      Distant Site(s) Involved: Not applicable Pathologic Stage Classification (pTNM, AJCC 8th Edition): pT1a, pN1 Ancillary Studies: MMR / MSI testing will be ordered Representative Tumor Block: C1 Comment(s): None  Assessment & Plan: Kayla Mcgrath is a 76 y.o. woman with Stage IIIC2 carcinosarcoma s/p surgery on 12/13.  Doing well. Meeting post-op milestones. Discussed continued expectations.   Reviewed  pathology from surgery which confirms carcinosarcoma. MMR/MSI/p53 results pending. Given high risk histology with residual disease, discussed need for adjuvant chemotherapy; additional testing will determine whether immunotherapy added to chemo. May consider pelvic radiation given cervical involvement, positive cervical margin, and residual bulking nodal disease.  I discussed the assessment and treatment plan with the patient. The patient was provided with an opportunity to ask questions and all were answered. The patient agreed with the plan and demonstrated an understanding of the instructions.   The patient was advised to call back or see an in-person evaluation if the symptoms worsen or if the condition fails to improve as anticipated.   12 minutes of total time was spent for this patient encounter, including preparation, phone counseling with the patient and coordination of care, and documentation of the encounter.   Jeral Pinch, MD  Division of Gynecologic Oncology  Department of Obstetrics and Gynecology  [facility]

## 2021-12-30 ENCOUNTER — Other Ambulatory Visit: Payer: Self-pay | Admitting: Gynecologic Oncology

## 2021-12-30 ENCOUNTER — Other Ambulatory Visit: Payer: Self-pay | Admitting: Hematology and Oncology

## 2021-12-30 ENCOUNTER — Telehealth: Payer: Self-pay

## 2021-12-30 DIAGNOSIS — K921 Melena: Secondary | ICD-10-CM

## 2021-12-30 DIAGNOSIS — C549 Malignant neoplasm of corpus uteri, unspecified: Secondary | ICD-10-CM

## 2021-12-30 DIAGNOSIS — Z7901 Long term (current) use of anticoagulants: Secondary | ICD-10-CM

## 2021-12-30 NOTE — Telephone Encounter (Signed)
Pt called office stating she recently had surgery with Dr.Tucker (12/13). She has been taking Eliquis BID since 12/15. This morning she had a BM and it was a little black, she had a burger last night so didn't think much of it. She has since then had another black tarry stool, lot's of it but not diarrhea.  No cramping/pain, no active bleeding, normal BM's/no constipation.   Should she stop the Eliquis?   Aware Dr. Berline Lopes is in the OR and I will call her back with advise,

## 2021-12-30 NOTE — Progress Notes (Signed)
DISCONTINUE ON PATHWAY REGIMEN - Uterine     A cycle is every 21 days:     Paclitaxel      Carboplatin   **Always confirm dose/schedule in your pharmacy ordering system**  REASON: Other Reason PRIOR TREATMENT: [id number]: Referral to Radiation Followed by Carboplatin AUC=6 + Paclitaxel 175 mg/m2 q21 Days x 6 Cycles  START OFF PATHWAY REGIMEN - Uterine   [id number]:Carboplatin IV + Paclitaxel IV + Trastuzumab IV q21 Days (C1-6) Followed by Trastuzumab IV q21 Days (C7+):   Cycle 1: A cycle is 21 days:     Trastuzumab-xxxx      Paclitaxel      Carboplatin    Cycles 2 through 6: A cycle is every 21 days:     Trastuzumab-xxxx      Paclitaxel      Carboplatin    Cycles 7 and beyond: A cycle is every 21 days:     Trastuzumab-xxxx   **Always confirm dose/schedule in your pharmacy ordering system**  Patient Characteristics: Carcinosarcoma, Newly Diagnosed, Postoperative (Pathologic Staging), Stage III/IV, MSS/pMMR Histology: Carcinosarcoma Therapeutic Status: Newly Diagnosed, Postoperative (Pathologic Staging) AJCC M Category: cM0 AJCC 8 Stage Grouping: IIIC2 AJCC T Category: pT3 AJCC N Category: pN2 Microsatellite/Mismatch Repair Status: MSS/pMMR Intent of Therapy: Curative Intent, Discussed with Patient

## 2021-12-30 NOTE — Telephone Encounter (Signed)
Just spoke with her. She is aware to hold Eliquis and that office will call first thing regarding time to come for CBC and guaiac

## 2021-12-31 ENCOUNTER — Other Ambulatory Visit: Payer: Self-pay

## 2021-12-31 ENCOUNTER — Inpatient Hospital Stay: Payer: Medicare HMO

## 2021-12-31 ENCOUNTER — Other Ambulatory Visit: Payer: Self-pay | Admitting: Hematology and Oncology

## 2021-12-31 ENCOUNTER — Other Ambulatory Visit: Payer: Self-pay | Admitting: Oncology

## 2021-12-31 ENCOUNTER — Telehealth: Payer: Self-pay | Admitting: Oncology

## 2021-12-31 ENCOUNTER — Other Ambulatory Visit: Payer: Self-pay | Admitting: Gynecologic Oncology

## 2021-12-31 ENCOUNTER — Telehealth: Payer: Self-pay | Admitting: Gynecologic Oncology

## 2021-12-31 ENCOUNTER — Encounter: Payer: Self-pay | Admitting: Hematology and Oncology

## 2021-12-31 ENCOUNTER — Telehealth: Payer: Self-pay | Admitting: Hematology and Oncology

## 2021-12-31 DIAGNOSIS — Z853 Personal history of malignant neoplasm of breast: Secondary | ICD-10-CM | POA: Diagnosis not present

## 2021-12-31 DIAGNOSIS — K921 Melena: Secondary | ICD-10-CM

## 2021-12-31 DIAGNOSIS — C549 Malignant neoplasm of corpus uteri, unspecified: Secondary | ICD-10-CM

## 2021-12-31 DIAGNOSIS — D539 Nutritional anemia, unspecified: Secondary | ICD-10-CM

## 2021-12-31 DIAGNOSIS — Z7901 Long term (current) use of anticoagulants: Secondary | ICD-10-CM

## 2021-12-31 DIAGNOSIS — Z85828 Personal history of other malignant neoplasm of skin: Secondary | ICD-10-CM | POA: Diagnosis not present

## 2021-12-31 DIAGNOSIS — C778 Secondary and unspecified malignant neoplasm of lymph nodes of multiple regions: Secondary | ICD-10-CM | POA: Diagnosis not present

## 2021-12-31 DIAGNOSIS — R634 Abnormal weight loss: Secondary | ICD-10-CM | POA: Diagnosis not present

## 2021-12-31 DIAGNOSIS — N179 Acute kidney failure, unspecified: Secondary | ICD-10-CM | POA: Diagnosis not present

## 2021-12-31 DIAGNOSIS — N950 Postmenopausal bleeding: Secondary | ICD-10-CM | POA: Diagnosis not present

## 2021-12-31 LAB — CBC (CANCER CENTER ONLY)
HCT: 22.8 % — ABNORMAL LOW (ref 36.0–46.0)
Hemoglobin: 7.4 g/dL — ABNORMAL LOW (ref 12.0–15.0)
MCH: 31 pg (ref 26.0–34.0)
MCHC: 32.5 g/dL (ref 30.0–36.0)
MCV: 95.4 fL (ref 80.0–100.0)
Platelet Count: 277 10*3/uL (ref 150–400)
RBC: 2.39 MIL/uL — ABNORMAL LOW (ref 3.87–5.11)
RDW: 17.1 % — ABNORMAL HIGH (ref 11.5–15.5)
WBC Count: 11.7 10*3/uL — ABNORMAL HIGH (ref 4.0–10.5)
nRBC: 0 % (ref 0.0–0.2)

## 2021-12-31 LAB — OCCULT BLOOD X 1 CARD TO LAB, STOOL
Fecal Occult Bld: POSITIVE — AB
Fecal Occult Bld: POSITIVE — AB
Fecal Occult Bld: POSITIVE — AB

## 2021-12-31 LAB — PREPARE RBC (CROSSMATCH)

## 2021-12-31 MED ORDER — SODIUM CHLORIDE 0.9% IV SOLUTION
250.0000 mL | Freq: Once | INTRAVENOUS | Status: AC
  Administered 2021-12-31: 250 mL via INTRAVENOUS

## 2021-12-31 MED ORDER — DIPHENHYDRAMINE HCL 25 MG PO CAPS
25.0000 mg | ORAL_CAPSULE | Freq: Once | ORAL | Status: AC
  Administered 2021-12-31: 25 mg via ORAL
  Filled 2021-12-31: qty 1

## 2021-12-31 NOTE — Telephone Encounter (Signed)
I reviewed her blood count today and explained to her why blood transfusion is important Ultimately, she agreed to return today for blood transfusion She received 1 unit of blood last month, consent available in the chart and she is in agreement to proceed with another unit of blood today

## 2021-12-31 NOTE — Telephone Encounter (Signed)
Called patient.  Discussed positive fecal occult blood test.  She is getting blood transfusion now.  We have sent urgent GI referral.  Discussed precautions over the weekend that should prompt her to come into the emergency department.  Jeral Pinch MD Gynecologic Oncology

## 2021-12-31 NOTE — Telephone Encounter (Signed)
Kayla Mcgrath and scheduled a lab appointment for 10:00.  Also advised her to bring in a stool sample if possible.  She said she thinks that grapefruit juice was causing the dark stools.  She held her Eliquis this morning and also did not drink any grapefruit juice.  Her first stool was a little dark but the 2nd was more normal.

## 2021-12-31 NOTE — Patient Instructions (Signed)

## 2022-01-03 LAB — TYPE AND SCREEN
ABO/RH(D): O POS
Antibody Screen: NEGATIVE
Unit division: 0

## 2022-01-03 LAB — BPAM RBC
Blood Product Expiration Date: 202401242359
ISSUE DATE / TIME: 202312221508
Unit Type and Rh: 5100

## 2022-01-04 ENCOUNTER — Telehealth: Payer: Self-pay | Admitting: Gastroenterology

## 2022-01-04 ENCOUNTER — Telehealth: Payer: Self-pay | Admitting: Hematology and Oncology

## 2022-01-04 NOTE — Telephone Encounter (Signed)
Left message for patient to call back  

## 2022-01-04 NOTE — Telephone Encounter (Signed)
We received an Urgent referral for this patient on 12/22 from Dr. Berline Lopes of [facility] stating patient has been having black stools.  The first OV I'm showing with an APP is 1/19 and the first OV with Dr. Tarri Glenn is 2/5.  Please call patient and advise.  Thank you.

## 2022-01-04 NOTE — Telephone Encounter (Signed)
Scheduled OV with patient for tomorrow 01/05/22 at 1:30 pm with Dr. Tarri Glenn.

## 2022-01-04 NOTE — Telephone Encounter (Signed)
Rescheduled appointment per room/resource. Patient is aware of the changes made to her upcoming appointment,

## 2022-01-05 ENCOUNTER — Ambulatory Visit: Payer: Medicare HMO | Admitting: Gastroenterology

## 2022-01-05 ENCOUNTER — Encounter: Payer: Self-pay | Admitting: Gastroenterology

## 2022-01-05 ENCOUNTER — Other Ambulatory Visit (INDEPENDENT_AMBULATORY_CARE_PROVIDER_SITE_OTHER): Payer: Medicare HMO

## 2022-01-05 VITALS — BP 124/74 | HR 85 | Ht 59.0 in | Wt 143.0 lb

## 2022-01-05 DIAGNOSIS — K921 Melena: Secondary | ICD-10-CM | POA: Diagnosis not present

## 2022-01-05 DIAGNOSIS — D500 Iron deficiency anemia secondary to blood loss (chronic): Secondary | ICD-10-CM

## 2022-01-05 LAB — CBC WITH DIFFERENTIAL/PLATELET
Basophils Absolute: 0 10*3/uL (ref 0.0–0.1)
Basophils Relative: 0.5 % (ref 0.0–3.0)
Eosinophils Absolute: 0.2 10*3/uL (ref 0.0–0.7)
Eosinophils Relative: 2 % (ref 0.0–5.0)
HCT: 29.5 % — ABNORMAL LOW (ref 36.0–46.0)
Hemoglobin: 9.8 g/dL — ABNORMAL LOW (ref 12.0–15.0)
Lymphocytes Relative: 11.5 % — ABNORMAL LOW (ref 12.0–46.0)
Lymphs Abs: 1 10*3/uL (ref 0.7–4.0)
MCHC: 33.1 g/dL (ref 30.0–36.0)
MCV: 87.8 fl (ref 78.0–100.0)
Monocytes Absolute: 0.6 10*3/uL (ref 0.1–1.0)
Monocytes Relative: 7 % (ref 3.0–12.0)
Neutro Abs: 7.2 10*3/uL (ref 1.4–7.7)
Neutrophils Relative %: 79 % — ABNORMAL HIGH (ref 43.0–77.0)
Platelets: 353 10*3/uL (ref 150.0–400.0)
RBC: 3.36 Mil/uL — ABNORMAL LOW (ref 3.87–5.11)
RDW: 22.6 % — ABNORMAL HIGH (ref 11.5–15.5)
WBC: 9.1 10*3/uL (ref 4.0–10.5)

## 2022-01-05 NOTE — Patient Instructions (Addendum)
_______________________________________________________  If you are age 75 or older, your body mass index should be between 23-30. Your Body mass index is 28.88 kg/m. If this is out of the aforementioned range listed, please consider follow up with your Primary Care Provider.  If you are age 60 or younger, your body mass index should be between 19-25. Your Body mass index is 28.88 kg/m. If this is out of the aformentioned range listed, please consider follow up with your Primary Care Provider.   ________________________________________________________  The [facility] providers would like to encourage you to use [facility] to communicate with providers for non-urgent requests or questions.  Due to long hold times on the telephone, sending your provider a message by [facility] may be a faster and more efficient way to get a response.  Please allow 48 business hours for a response.  Please remember that this is for non-urgent requests.  _______________________________________________________   Your provider has requested that you go to the basement level for lab work before leaving today. Press "B" on the elevator. The lab is located at the first door on the left as you exit the elevator.

## 2022-01-05 NOTE — Progress Notes (Signed)
Referring Provider: Lafonda Mosses, MD Primary Care Physician:  Lafonda Mosses, MD  Reason for Consultation:  Melena   IMPRESSION:  Melena/Hemoccult + with progressive anemia occurring while on Eliquis for post-operative prophylaxis after her total hysterectomy 12/22/21 for stage IIIc2 uterine carcinosarcoma. Her last dose of Eliquis was 12/30/21 on the day the bleeding was first noted.  Etiology of bleeding is unclear. Unlikely to be from the colon given her high quality colonoscopy 01/20/20. Recommend EGD +/- CT if endoscopic evaluation is negative.   PLAN: CBC today to follow-up on anemia EGD first available (Eliquis remains on hold)    HPI: Kayla Mcgrath is a 76 y.o. female referred by Dr. Berline Lopes for black tarry stools and associated anemia.  The history is obtained through the patient review of her electronic health record.  I met her at the time of her surveillance colonoscopy 01/20/2020.  This is my first office visit with Kayla Mcgrath.  She has a history of anxiety, depression, arthritis.  She has recently been diagnosed with stage IIIC2 uterine cancinosarcoma and had a robotic assisted total hysterectomy 12/22/2021.  Postoperatively she will receive treatment with paclitaxel and carboplatin.  Following surgery while on prophylactic Eliquis she had melena 12/30/2021 and progressive anemia requiring transfusion of packed red blood cells  last month and most recently 12/31/2021. She has had no further melena since she stopped the Eliquis after the first and only episode of melena. Stool cards were positive for fecal occult blood x 3.  The patient attributes her melena to drinking grape juice while using Eliquis.  There is been no abdominal pain, diarrhea, change in bowel habits, or bright red blood per rectum.  No prior history of melena. Has had some bright red bleeding from hemorrhoids in the distant past but not recently. She had some abdominal pain after her surgery, but she  thinks the amount was normal for her surgery and resolved faster than she expected.  Rare use of Motrin. She was given Tramadol after her surgery.   Imaging over the last 2 months includes a pelvic MRI, renal stone protocol CT, and abdominal ultrasound.  Imaging showed a large central uterine mass, retroperitoneal and bilateral pelvic adenopathy compatible with metastatic disease, diverticulosis without diverticulitis, and cholelithiasis.  Labs 12/31/2021 show a hemoglobin of 7.4, MCV 95.4, RDW 17.1, platelets 271. Her hemoglobin appears to have last been normal in 2021 at 14.4.  Since October it is ranged from 7.3-11.2.  Evaluation of anemia in November showed an iron of 30, ferritin 418, TIBC 155  BUN was elevated in November at 71 but has recently been normal at 50.  At the time of that peak her creatinine was also high at 2.98.  Most recently her creatinine was 1.29.  There is no known family history of colon cancer or polyps. No family history of stomach cancer or other GI malignancy. No family history of inflammatory bowel disease or celiac.   Endoscopic history: -Colonoscopy with Dr. Maurene Capes 2015: Tubular adenoma and hyperplastic polyp Colonoscopy 01/20/2020 showed 3 small polyps, left-sided diverticulosis, and internal hemorrhoids.  2 of these polyps were tubular adenomas and the third was a hyperplastic polyp.   Past Medical History:  Diagnosis Date   Adjustment disorder with anxious mood 12/05/2018   Anemia    Anxiety    Arthralgia 06/29/2015   Arthritis    Breast cancer ([facility]) 2010   Colon polyps    ?   Complication of anesthesia    DCIS (ductal  carcinoma in situ) of breast 2010   Depression    Diverticulosis    Full dentures    Hyperlipidemia    Joint pain    Leukocytosis 07/11/2017   PONV (postoperative nausea and vomiting)    Woke up during surgery   Skin cancer    SCC   Wears glasses     Past Surgical History:  Procedure Laterality Date   BREAST BIOPSY Right  05/11/2012   BREAST BIOPSY Right 04/24/2012   BREAST LUMPECTOMY Right 12/2008   cancer-tamoxifen   BREAST RECONSTRUCTION WITH PLACEMENT OF TISSUE EXPANDER AND FLEX HD (ACELLULAR HYDRATED DERMIS) Right 06/20/2012   Procedure: BREAST RECONSTRUCTION WITH PLACEMENT OF TISSUE EXPANDER AND FLEX HD (ACELLULAR HYDRATED DERMIS);  Surgeon: Theodoro Kos, DO;  Location: [facility];  Service: Plastics;  Laterality: Right;   BREAST REDUCTION WITH MASTOPEXY Left 10/11/2012   Procedure: LEFT BREAST REDUCTION WITH MASTOPEXY;  Surgeon: Theodoro Kos, DO;  Location: [facility];  Service: Plastics;  Laterality: Left;   cataract sugery Bilateral 03/2019   COLONOSCOPY  08/28/2013   [facility]   DILATION AND CURETTAGE OF UTERUS     heavy bleeding between two pregnancies   LIPOSUCTION WITH LIPOFILLING Left 10/11/2012   Procedure: LIPOSUCTION WITH LIPOFILLING;  Surgeon: Theodoro Kos, DO;  Location: [facility];  Service: Plastics;  Laterality: Left;   LYMPH NODE DISSECTION N/A 12/22/2021   Procedure: LYMPH NODE DISSECTION;  Surgeon: Lafonda Mosses, MD;  Location: [facility];  Service: Gynecology;  Laterality: N/A;   MASTECTOMY Right 06/20/2012   MASTECTOMY W/ SENTINEL NODE BIOPSY Right 06/20/2012   Procedure: right skin sparing MASTECTOMY WITH SENTINEL LYMPH NODE BIOPSY;  Surgeon: Stark Klein, MD;  Location: [facility];  Service: General;  Laterality: Right;   PARATHYROIDECTOMY Left 11/07/2017   Procedure: NECK EXPLORATION WITH LEFT THYROID LOBECTOMY;  Surgeon: [name], MD;  Location: [facility];  Service: General;  Laterality: Left;   POLYPECTOMY     REDUCTION MAMMAPLASTY Left 10/2012   REMOVAL OF TISSUE EXPANDER AND PLACEMENT OF IMPLANT Right 10/11/2012   Procedure: REMOVAL OF TISSUE EXPANDER AND PLACEMENT OF SILICONE IMPLANT RIGHT;  Surgeon: Theodoro Kos, DO;  Location: [facility];  Service: Plastics;  Laterality: Right;      Current  Outpatient Medications  Medication Sig Dispense Refill   famotidine (PEPCID) 20 MG tablet Take 20 mg by mouth 2 (two) times daily as needed for heartburn or indigestion.     OVER THE COUNTER MEDICATION Apply 1 application  topically as needed (knee pain). CBD Cream- (Patient not taking: Reported on 01/07/2022)     traMADol (ULTRAM) 50 MG tablet Take 1 tablet (50 mg total) by mouth every 12 (twelve) hours as needed for severe pain. For AFTER surgery only, do not take and drive 10 tablet 0   apixaban (ELIQUIS) 2.5 MG TABS tablet Take 1 tablet (2.5 mg total) by mouth 2 (two) times daily. For AFTER surgery, plan to start taking 12/24/21 in the am (Patient not taking: Reported on 01/07/2022) 28 tablet 0   Cholecalciferol (VITAMIN D) 2000 UNITS CAPS Take 2,000 Units by mouth daily. Take one by mouth daily (Patient not taking: Reported on 01/05/2022)     escitalopram (LEXAPRO) 10 MG tablet Take 1 tablet (10 mg total) by mouth daily. 90 tablet 3   magnesium 30 MG tablet Take 30 mg by mouth daily. (Patient not taking: Reported on 01/05/2022)     pantoprazole (PROTONIX) 40 MG tablet  Take 1 tablet (40 mg total) by mouth 2 (two) times daily. 120 tablet 0   senna-docusate (SENOKOT-S) 8.6-50 MG tablet Take 2 tablets by mouth at bedtime. For AFTER surgery, do not take if having diarrhea (Patient not taking: Reported on 01/05/2022) 30 tablet 0   vitamin B-12 (CYANOCOBALAMIN) 1000 MCG tablet Take 1,000 mcg by mouth daily at 2 PM. (Patient not taking: Reported on 01/05/2022)     No current facility-administered medications for this visit.    Allergies as of 01/05/2022 - Review Complete 01/05/2022  Allergen Reaction Noted   Tylenol [acetaminophen] Hives and Swelling 09/23/2021   Latex Rash 04/05/2012    Family History  Problem Relation Age of Onset   Alcohol abuse Mother    Colon polyps Mother    Mental illness Mother    Alcohol abuse Father        suicide   Mental illness Father    Cancer Sister         gyn cancer   Heart disease Paternal Grandfather    Colon cancer Neg Hx    Esophageal cancer Neg Hx    Rectal cancer Neg Hx    Stomach cancer Neg Hx    Breast cancer Neg Hx    Ovarian cancer Neg Hx    Endometrial cancer Neg Hx    Pancreatic cancer Neg Hx    Prostate cancer Neg Hx     Social History   Socioeconomic History   Marital status: Married    Spouse name: Not on file   Number of children: 2   Years of education: Not on file   Highest education level: Not on file  Occupational History   Occupation: retired Corporate treasurer  Tobacco Use   Smoking status: Former    Types: Cigarettes    Quit date: 06/14/1981    Years since quitting: 40.5   Smokeless tobacco: Never  Vaping Use   Vaping Use: Never used  Substance and Sexual Activity   Alcohol use: No   Drug use: No   Sexual activity: Not Currently    Birth control/protection: Post-menopausal  Other Topics Concern   Not on file  Social History Narrative   Recently moved from [place] to be closer to her two daughters and grandchildren.   Retired Corporate treasurer.   DNR- forms singed on 05/07/2012 and returned to pt.   Social Determinants of Health   Financial Resource Strain: Low Risk  (02/02/2021)   Overall Financial Resource Strain (CARDIA)    Difficulty of Paying Living Expenses: Not hard at all  Food Insecurity: No Food Insecurity (11/30/2021)   Hunger Vital Sign    Worried About Running Out of Food in the Last Year: Never true    Ran Out of Food in the Last Year: Never true  Transportation Needs: No Transportation Needs (11/30/2021)   PRAPARE - Hydrologist (Medical): No    Lack of Transportation (Non-Medical): No  Physical Activity: Insufficiently Active (02/02/2021)   Exercise Vital Sign    Days of Exercise per Week: 3 days    Minutes of Exercise per Session: 30 min  Stress: No Stress Concern Present (02/02/2021)   [place]    Feeling of  Stress : Not at all  Social Connections: Moderately Integrated (02/02/2021)   Social Connection and Isolation Panel [NHANES]    Frequency of Communication with Friends and Family: Twice a week    Frequency of Social Gatherings with  Friends and Family: Twice a week    Attends Religious Services: Never    Marine scientist or Organizations: Yes    Attends Music therapist: More than 4 times per year    Marital Status: Married  Human resources officer Violence: Not At Risk (11/30/2021)   Humiliation, Afraid, Rape, and Kick questionnaire    Fear of Current or Ex-Partner: No    Emotionally Abused: No    Physically Abused: No    Sexually Abused: No    Review of Systems: 12 system ROS is negative except as noted above.   Physical Exam: General:   Alert,  well-nourished, pleasant and cooperative in NAD Head:  Normocephalic and atraumatic. Eyes:  Sclera clear, no icterus.   Conjunctiva pink. Ears:  Normal auditory acuity. Nose:  No deformity, discharge,  or lesions. Mouth:  No deformity or lesions.   Neck:  Supple; no masses or thyromegaly. Lungs:  Clear throughout to auscultation.   No wheezes. Heart:  Regular rate and rhythm; no murmurs. Abdomen:  Soft, nontender, nondistended, normal bowel sounds, no rebound or guarding. No hepatosplenomegaly.   Rectal:  Deferred  Msk:  Symmetrical. No boney deformities LAD: No inguinal or umbilical LAD Extremities:  No clubbing or edema. Neurologic:  Alert and  oriented x4;  grossly nonfocal Skin:  Intact without significant lesions or rashes. Psych:  Alert and cooperative. Normal mood and affect.    Stephone Gum L. Tarri Glenn, MD, MPH 01/08/2022, 2:10 PM

## 2022-01-07 ENCOUNTER — Ambulatory Visit (AMBULATORY_SURGERY_CENTER): Payer: Medicare HMO | Admitting: Gastroenterology

## 2022-01-07 ENCOUNTER — Telehealth: Payer: Self-pay | Admitting: *Deleted

## 2022-01-07 ENCOUNTER — Encounter: Payer: Self-pay | Admitting: Gastroenterology

## 2022-01-07 VITALS — BP 136/76 | HR 66 | Temp 98.0°F | Resp 15 | Ht 59.0 in | Wt 143.0 lb

## 2022-01-07 DIAGNOSIS — K2980 Duodenitis without bleeding: Secondary | ICD-10-CM

## 2022-01-07 DIAGNOSIS — K921 Melena: Secondary | ICD-10-CM

## 2022-01-07 DIAGNOSIS — K2100 Gastro-esophageal reflux disease with esophagitis, without bleeding: Secondary | ICD-10-CM

## 2022-01-07 DIAGNOSIS — K2981 Duodenitis with bleeding: Secondary | ICD-10-CM | POA: Diagnosis not present

## 2022-01-07 DIAGNOSIS — K449 Diaphragmatic hernia without obstruction or gangrene: Secondary | ICD-10-CM

## 2022-01-07 DIAGNOSIS — E669 Obesity, unspecified: Secondary | ICD-10-CM | POA: Diagnosis not present

## 2022-01-07 DIAGNOSIS — K2289 Other specified disease of esophagus: Secondary | ICD-10-CM | POA: Diagnosis not present

## 2022-01-07 MED ORDER — PANTOPRAZOLE SODIUM 40 MG PO TBEC: 40.0000 mg | DELAYED_RELEASE_TABLET | Freq: Two times a day (BID) | ORAL | 0 refills | Status: DC

## 2022-01-07 MED ORDER — SODIUM CHLORIDE 0.9 % IV SOLN: 500.0000 mL | INTRAVENOUS | Status: DC

## 2022-01-07 NOTE — Telephone Encounter (Signed)
Fax daughter's FMLA

## 2022-01-07 NOTE — Progress Notes (Signed)
Pt's states no medical or surgical changes since previsit or office visit. 

## 2022-01-07 NOTE — Progress Notes (Signed)
Indications for EGD: Melena, progressive anemia requiring blood transfusion  Please see my 01/05/2022 office note for complete details.  There is been no significant change in history or physical exam since that time.  Patient remains an appropriate candidate for monitored anesthesia care in the endoscopy center.

## 2022-01-07 NOTE — Patient Instructions (Signed)
Patient has a contact number available for emergencies. The signs and symptoms of potential delayed complications were discussed with the patient. Return to normal activities tomorrow. Written discharge instructions were provided to the patient. - Resume previous diet. - Continue present medications. - Start pantoprazole 40 mg BID x 8 weeks. - Per the patient, Eliquis will not be restarted as it was used for postoperative prophylaxis. - Await pathology results. - No aspirin, ibuprofen, naproxen, or other non-steroidal anti-inflammatory drugs. - Consider repeat EGD in 8-10 weeks to document healing. - Plan CTA if there is additional bleeding requiring PRBCs.   YOU HAD AN ENDOSCOPIC PROCEDURE TODAY: Refer to the procedure report and other information in the discharge instructions given to you for any specific questions about what was found during the examination. If this information does not answer your questions, please call [place] office at [phone] to clarify.   YOU SHOULD EXPECT: Some feelings of bloating in the abdomen. Passage of more gas than usual. Walking can help get rid of the air that was put into your GI tract during the procedure and reduce the bloating. If you had a lower endoscopy (such as a colonoscopy or flexible sigmoidoscopy) you may notice spotting of blood in your stool or on the toilet paper. Some abdominal soreness may be present for a day or two, also.  DIET: Your first meal following the procedure should be a light meal and then it is ok to progress to your normal diet. A half-sandwich or bowl of soup is an example of a good first meal. Heavy or fried foods are harder to digest and may make you feel nauseous or bloated. Drink plenty of fluids but you should avoid alcoholic beverages for 24 hours. If you had a esophageal dilation, please see attached instructions for diet.    ACTIVITY: Your care partner should take you home directly after the procedure. You should plan  to take it easy, moving slowly for the rest of the day. You can resume normal activity the day after the procedure however YOU SHOULD NOT DRIVE, use power tools, machinery or perform tasks that involve climbing or major physical exertion for 24 hours (because of the sedation medicines used during the test).   SYMPTOMS TO REPORT IMMEDIATELY: A gastroenterologist can be reached at any hour. Please call [phone]  for any of the following symptoms:  Following upper endoscopy (EGD, EUS, ERCP, esophageal dilation) Vomiting of blood or coffee ground material  New, significant abdominal pain  New, significant chest pain or pain under the shoulder blades  Painful or persistently difficult swallowing  New shortness of breath  Black, tarry-looking or red, bloody stools  FOLLOW UP:  If any biopsies were taken you will be contacted by phone or by letter within the next 1-3 weeks. Call [phone]  if you have not heard about the biopsies in 3 weeks.  Please also call with any specific questions about appointments or follow up tests.

## 2022-01-07 NOTE — Progress Notes (Signed)
Report to PACU, RN, vss, BBS= Clear.  

## 2022-01-07 NOTE — Op Note (Signed)
[name] Procedure Date: 01/07/2022 7:15 AM MRN: [id number] Endoscopist: Thornton Park MD, MD, [id number] Age: 76 Referring MD:  Date of Birth: [birth year] Gender: Female Account #: [id number] Procedure:                Upper GI endoscopy Indications:              Acute post hemorrhagic anemia, Melena occuring                            while on Eliquis Medicines:                Monitored Anesthesia Care Procedure:                Pre-Anesthesia Assessment:                           - Prior to the procedure, a History and Physical                            was performed, and patient medications and                            allergies were reviewed. The patient's tolerance of                            previous anesthesia was also reviewed. The risks                            and benefits of the procedure and the sedation                            options and risks were discussed with the patient.                            All questions were answered, and informed consent                            was obtained. Prior Anticoagulants: The patient has                            taken Eliquis (apixaban), last dose was 8 days                            prior to procedure. After reviewing the risks and                            benefits, the patient was deemed in satisfactory                            condition to undergo the procedure.                           After obtaining informed consent, the endoscope was  passed under direct vision. Throughout the                            procedure, the patient's blood pressure, pulse, and                            oxygen saturations were monitored continuously. The                            GIF [id number] #[id number] was introduced through the                            mouth, and advanced to the third part of duodenum.                            The upper GI endoscopy was  accomplished without                            difficulty. The patient tolerated the procedure                            well. Scope In: Scope Out: Findings:                 LA Grade C (one or more mucosal breaks continuous                            between tops of 2 or more mucosal folds, less than                            75% circumference) esophagitis with no bleeding was                            found 36 cm from the incisors. Biopsies were taken                            with a cold forceps for histology. Estimated blood                            loss was minimal.                           Patchy mildly erythematous mucosa without bleeding                            was found in the gastric body. Biopsies from the                            antrum, body, and fundus were taken with a cold                            forceps for histology. Estimated blood loss was  minimal.                           A small hiatal hernia was present.                           Localized mild mucosal changes characterized by                            erosion were found in the duodenal bulb. Biopsies                            were taken with a cold forceps for histology.                            Estimated blood loss was minimal.                           The cardia and gastric fundus were normal on                            retroflexion.                           The exam was otherwise without abnormality. Complications:            No immediate complications. Estimated Blood Loss:     Estimated blood loss was minimal. Impression:               - LA Grade C reflux esophagitis with no bleeding.                            This was likely to be the cause of her bleeding in                            the setting of Eliquis. Biopsied.                           - Erythematous mucosa in the gastric body. Biopsied.                           - Small hiatal hernia.                            - Mucosal changes in the duodenum. Biopsied.                           - The examination was otherwise normal. Recommendation:           - Patient has a contact number available for                            emergencies. The signs and symptoms of potential                            delayed complications were discussed with the  patient. Return to normal activities tomorrow.                            Written discharge instructions were provided to the                            patient.                           - Resume previous diet.                           - Continue present medications.                           - Start pantoprazole 40 mg BID x 8 weeks.                           - Per the patient, Eliquis will not be restarted as                            it was used for postoperative prophylaxis.                           - Await pathology results.                           - No aspirin, ibuprofen, naproxen, or other                            non-steroidal anti-inflammatory drugs.                           - Consider repeat EGD in 8-10 weeks to document                            healing.                           - Plan CTA if there is additional bleeding                            requiring PRBCs. Thornton Park MD, MD 01/07/2022 8:11:24 AM This report has been signed electronically.

## 2022-01-08 ENCOUNTER — Encounter: Payer: Self-pay | Admitting: Gastroenterology

## 2022-01-11 ENCOUNTER — Telehealth: Payer: Self-pay | Admitting: Oncology

## 2022-01-11 ENCOUNTER — Other Ambulatory Visit (HOSPITAL_COMMUNITY): Payer: Self-pay | Admitting: Student

## 2022-01-11 NOTE — Telephone Encounter (Signed)
Kayla Mcgrath called and was advised of instructions for her port appointment tomorrow: arrive at [facility] admitting at 11:30, NPO after 7 am and she will need a driver and someone to stay with her for 24 hours after.  She verbalized understanding and agreement of instructions.

## 2022-01-12 ENCOUNTER — Ambulatory Visit (HOSPITAL_COMMUNITY)
Admission: RE | Admit: 2022-01-12 | Discharge: 2022-01-12 | Disposition: A | Discharge status: Home / Self Care | Payer: Medicare HMO | Source: Ambulatory Visit | Attending: Gynecologic Oncology | Admitting: Gynecologic Oncology

## 2022-01-12 ENCOUNTER — Inpatient Hospital Stay (HOSPITAL_COMMUNITY): Payer: Medicare HMO | Admitting: Anesthesiology

## 2022-01-12 ENCOUNTER — Encounter: Payer: Self-pay | Admitting: Gynecologic Oncology

## 2022-01-12 ENCOUNTER — Telehealth: Payer: Self-pay | Admitting: Oncology

## 2022-01-12 ENCOUNTER — Encounter (HOSPITAL_COMMUNITY)
Admission: RE | Disposition: A | Discharge status: Home / Self Care | Payer: Self-pay | Source: Ambulatory Visit | Attending: Gynecologic Oncology

## 2022-01-12 ENCOUNTER — Ambulatory Visit (HOSPITAL_COMMUNITY)
Admission: RE | Admit: 2022-01-12 | Discharge: 2022-01-12 | Disposition: A | Discharge status: Home / Self Care | Payer: Medicare HMO | Source: Ambulatory Visit | Attending: Hematology and Oncology | Admitting: Hematology and Oncology

## 2022-01-12 ENCOUNTER — Telehealth: Payer: Self-pay

## 2022-01-12 ENCOUNTER — Encounter (HOSPITAL_COMMUNITY): Payer: Self-pay

## 2022-01-12 DIAGNOSIS — K219 Gastro-esophageal reflux disease without esophagitis: Secondary | ICD-10-CM | POA: Diagnosis not present

## 2022-01-12 DIAGNOSIS — Z853 Personal history of malignant neoplasm of breast: Secondary | ICD-10-CM | POA: Insufficient documentation

## 2022-01-12 DIAGNOSIS — Z85828 Personal history of other malignant neoplasm of skin: Secondary | ICD-10-CM | POA: Insufficient documentation

## 2022-01-12 DIAGNOSIS — Z9011 Acquired absence of right breast and nipple: Secondary | ICD-10-CM | POA: Insufficient documentation

## 2022-01-12 DIAGNOSIS — Z452 Encounter for adjustment and management of vascular access device: Secondary | ICD-10-CM | POA: Diagnosis not present

## 2022-01-12 DIAGNOSIS — T8141XA Infection following a procedure, superficial incisional surgical site, initial encounter: Secondary | ICD-10-CM | POA: Insufficient documentation

## 2022-01-12 DIAGNOSIS — E785 Hyperlipidemia, unspecified: Secondary | ICD-10-CM | POA: Insufficient documentation

## 2022-01-12 DIAGNOSIS — T8142XA Infection following a procedure, deep incisional surgical site, initial encounter: Secondary | ICD-10-CM | POA: Diagnosis not present

## 2022-01-12 DIAGNOSIS — Z87891 Personal history of nicotine dependence: Secondary | ICD-10-CM | POA: Insufficient documentation

## 2022-01-12 DIAGNOSIS — C55 Malignant neoplasm of uterus, part unspecified: Secondary | ICD-10-CM | POA: Insufficient documentation

## 2022-01-12 DIAGNOSIS — F418 Other specified anxiety disorders: Secondary | ICD-10-CM | POA: Diagnosis not present

## 2022-01-12 DIAGNOSIS — F419 Anxiety disorder, unspecified: Secondary | ICD-10-CM | POA: Insufficient documentation

## 2022-01-12 DIAGNOSIS — M1990 Unspecified osteoarthritis, unspecified site: Secondary | ICD-10-CM | POA: Diagnosis not present

## 2022-01-12 DIAGNOSIS — L02211 Cutaneous abscess of abdominal wall: Secondary | ICD-10-CM | POA: Diagnosis not present

## 2022-01-12 DIAGNOSIS — Y838 Other surgical procedures as the cause of abnormal reaction of the patient, or of later complication, without mention of misadventure at the time of the procedure: Secondary | ICD-10-CM | POA: Diagnosis not present

## 2022-01-12 DIAGNOSIS — C549 Malignant neoplasm of corpus uteri, unspecified: Secondary | ICD-10-CM

## 2022-01-12 DIAGNOSIS — D649 Anemia, unspecified: Secondary | ICD-10-CM | POA: Insufficient documentation

## 2022-01-12 DIAGNOSIS — T8149XA Infection following a procedure, other surgical site, initial encounter: Secondary | ICD-10-CM

## 2022-01-12 DIAGNOSIS — N289 Disorder of kidney and ureter, unspecified: Secondary | ICD-10-CM | POA: Diagnosis not present

## 2022-01-12 DIAGNOSIS — F32A Depression, unspecified: Secondary | ICD-10-CM | POA: Insufficient documentation

## 2022-01-12 HISTORY — PX: LAPAROTOMY: SHX154

## 2022-01-12 HISTORY — PX: IR IMAGING GUIDED PORT INSERTION: IMG5740

## 2022-01-12 LAB — COMPREHENSIVE METABOLIC PANEL
ALT: 12 U/L (ref 0–44)
AST: 16 U/L (ref 15–41)
Albumin: 3 g/dL — ABNORMAL LOW (ref 3.5–5.0)
Alkaline Phosphatase: 71 U/L (ref 38–126)
Anion gap: 9 (ref 5–15)
BUN: 19 mg/dL (ref 8–23)
CO2: 25 mmol/L (ref 22–32)
Calcium: 8.7 mg/dL — ABNORMAL LOW (ref 8.9–10.3)
Chloride: 104 mmol/L (ref 98–111)
Creatinine, Ser: 1.21 mg/dL — ABNORMAL HIGH (ref 0.44–1.00)
GFR, Estimated: 47 mL/min — ABNORMAL LOW (ref >60)
Glucose, Bld: 98 mg/dL (ref 70–99)
Potassium: 3.8 mmol/L (ref 3.5–5.1)
Sodium: 138 mmol/L (ref 135–145)
Total Bilirubin: 0.2 mg/dL — ABNORMAL LOW (ref 0.3–1.2)
Total Protein: 6.4 g/dL — ABNORMAL LOW (ref 6.5–8.1)

## 2022-01-12 LAB — CBC
HCT: 30.5 % — ABNORMAL LOW (ref 36.0–46.0)
Hemoglobin: 9.5 g/dL — ABNORMAL LOW (ref 12.0–15.0)
MCH: 29.1 pg (ref 26.0–34.0)
MCHC: 31.1 g/dL (ref 30.0–36.0)
MCV: 93.6 fL (ref 80.0–100.0)
Platelets: 315 10*3/uL (ref 150–400)
RBC: 3.26 MIL/uL — ABNORMAL LOW (ref 3.87–5.11)
RDW: 20.2 % — ABNORMAL HIGH (ref 11.5–15.5)
WBC: 7.9 10*3/uL (ref 4.0–10.5)
nRBC: 0 % (ref 0.0–0.2)

## 2022-01-12 SURGERY — LAPAROTOMY, EXPLORATORY
Anesthesia: General | Site: Abdomen

## 2022-01-12 MED ORDER — HEPARIN SOD (PORK) LOCK FLUSH 100 UNIT/ML IV SOLN
INTRAVENOUS | Status: AC
  Filled 2022-01-12: qty 5

## 2022-01-12 MED ORDER — POVIDONE-IODINE 10 % EX SWAB
2.0000 | Freq: Once | CUTANEOUS | Status: AC
  Administered 2022-01-12: 2 via TOPICAL

## 2022-01-12 MED ORDER — LIDOCAINE 2% (20 MG/ML) 5 ML SYRINGE
INTRAMUSCULAR | Status: DC | PRN
  Administered 2022-01-12: 30 mg via INTRAVENOUS

## 2022-01-12 MED ORDER — FENTANYL CITRATE (PF) 100 MCG/2ML IJ SOLN
INTRAMUSCULAR | Status: AC | PRN
  Administered 2022-01-12: 25 ug via INTRAVENOUS

## 2022-01-12 MED ORDER — MIDAZOLAM HCL 2 MG/2ML IJ SOLN: 0.5000 mg | Freq: Once | INTRAMUSCULAR | Status: DC | PRN

## 2022-01-12 MED ORDER — MIDAZOLAM HCL 2 MG/2ML IJ SOLN
INTRAMUSCULAR | Status: AC | PRN
  Administered 2022-01-12: .5 mg via INTRAVENOUS

## 2022-01-12 MED ORDER — ONDANSETRON HCL 4 MG/2ML IJ SOLN
INTRAMUSCULAR | Status: AC
  Filled 2022-01-12: qty 2

## 2022-01-12 MED ORDER — PROPOFOL 10 MG/ML IV BOLUS
INTRAVENOUS | Status: AC
  Filled 2022-01-12: qty 20

## 2022-01-12 MED ORDER — PHENYLEPHRINE 80 MCG/ML (10ML) SYRINGE FOR IV PUSH (FOR BLOOD PRESSURE SUPPORT)
PREFILLED_SYRINGE | INTRAVENOUS | Status: DC | PRN
  Administered 2022-01-12 (×2): 80 ug via INTRAVENOUS

## 2022-01-12 MED ORDER — CEFAZOLIN SODIUM-DEXTROSE 2-4 GM/100ML-% IV SOLN
2.0000 g | INTRAVENOUS | Status: AC
  Administered 2022-01-12: 2 g via INTRAVENOUS

## 2022-01-12 MED ORDER — HYDROMORPHONE HCL 1 MG/ML IJ SOLN
INTRAMUSCULAR | Status: AC
  Administered 2022-01-12: 0.25 mg via INTRAVENOUS
  Filled 2022-01-12: qty 1

## 2022-01-12 MED ORDER — SODIUM CHLORIDE 0.9 % IV SOLN: INTRAVENOUS | Status: DC

## 2022-01-12 MED ORDER — HEPARIN SOD (PORK) LOCK FLUSH 100 UNIT/ML IV SOLN
500.0000 [IU] | Freq: Once | INTRAVENOUS | Status: AC
  Administered 2022-01-12: 500 [IU] via INTRAVENOUS

## 2022-01-12 MED ORDER — FENTANYL CITRATE (PF) 100 MCG/2ML IJ SOLN
INTRAMUSCULAR | Status: AC
  Filled 2022-01-12: qty 2

## 2022-01-12 MED ORDER — OXYCODONE HCL 5 MG PO TABS: 5.0000 mg | ORAL_TABLET | Freq: Once | ORAL | Status: DC | PRN

## 2022-01-12 MED ORDER — TRAMADOL HCL 50 MG PO TABS: 50.0000 mg | ORAL_TABLET | Freq: Two times a day (BID) | ORAL | 0 refills | Status: AC | PRN

## 2022-01-12 MED ORDER — MIDAZOLAM HCL 2 MG/2ML IJ SOLN
INTRAMUSCULAR | Status: AC
  Filled 2022-01-12: qty 2

## 2022-01-12 MED ORDER — LIDOCAINE HCL 1 % IJ SOLN
INTRAMUSCULAR | Status: AC
  Filled 2022-01-12: qty 20

## 2022-01-12 MED ORDER — ONDANSETRON HCL 4 MG/2ML IJ SOLN
INTRAMUSCULAR | Status: DC | PRN
  Administered 2022-01-12: 4 mg via INTRAVENOUS

## 2022-01-12 MED ORDER — MEPERIDINE HCL 50 MG/ML IJ SOLN: 6.2500 mg | INTRAMUSCULAR | Status: DC | PRN

## 2022-01-12 MED ORDER — HYDROMORPHONE HCL 1 MG/ML IJ SOLN
0.2500 mg | INTRAMUSCULAR | Status: DC | PRN
  Administered 2022-01-12 (×2): 0.25 mg via INTRAVENOUS

## 2022-01-12 MED ORDER — PROMETHAZINE HCL 25 MG/ML IJ SOLN: 6.2500 mg | INTRAMUSCULAR | Status: DC | PRN

## 2022-01-12 MED ORDER — OXYCODONE HCL 5 MG/5ML PO SOLN: 5.0000 mg | Freq: Once | ORAL | Status: DC | PRN

## 2022-01-12 MED ORDER — CEFAZOLIN SODIUM-DEXTROSE 2-4 GM/100ML-% IV SOLN
INTRAVENOUS | Status: AC
  Filled 2022-01-12: qty 100

## 2022-01-12 MED ORDER — DEXAMETHASONE SODIUM PHOSPHATE 10 MG/ML IJ SOLN
INTRAMUSCULAR | Status: DC | PRN
  Administered 2022-01-12: 4 mg via INTRAVENOUS

## 2022-01-12 MED ORDER — DEXAMETHASONE SODIUM PHOSPHATE 10 MG/ML IJ SOLN
INTRAMUSCULAR | Status: AC
  Filled 2022-01-12: qty 1

## 2022-01-12 MED ORDER — PROPOFOL 10 MG/ML IV BOLUS
INTRAVENOUS | Status: DC | PRN
  Administered 2022-01-12: 140 mg via INTRAVENOUS

## 2022-01-12 MED ORDER — PHENYLEPHRINE 80 MCG/ML (10ML) SYRINGE FOR IV PUSH (FOR BLOOD PRESSURE SUPPORT)
PREFILLED_SYRINGE | INTRAVENOUS | Status: AC
  Filled 2022-01-12: qty 10

## 2022-01-12 MED ORDER — CLINDAMYCIN HCL 300 MG PO CAPS: 300.0000 mg | ORAL_CAPSULE | Freq: Two times a day (BID) | ORAL | 0 refills | Status: DC

## 2022-01-12 MED ORDER — FENTANYL CITRATE (PF) 100 MCG/2ML IJ SOLN
INTRAMUSCULAR | Status: DC | PRN
  Administered 2022-01-12 (×3): 25 ug via INTRAVENOUS

## 2022-01-12 MED ORDER — 0.9 % SODIUM CHLORIDE (POUR BTL) OPTIME
TOPICAL | Status: DC | PRN
  Administered 2022-01-12: 1000 mL

## 2022-01-12 MED ORDER — LIDOCAINE HCL (PF) 2 % IJ SOLN
INTRAMUSCULAR | Status: AC
  Filled 2022-01-12: qty 5

## 2022-01-12 MED ORDER — CHLORHEXIDINE GLUCONATE 0.12 % MT SOLN
15.0000 mL | Freq: Once | OROMUCOSAL | Status: AC
  Administered 2022-01-12: 15 mL via OROMUCOSAL

## 2022-01-12 SURGICAL SUPPLY — 74 items
ADH SKN CLS APL DERMABOND .7 (GAUZE/BANDAGES/DRESSINGS)
AGENT HMST KT MTR STRL THRMB (HEMOSTASIS)
APL PRP STRL LF DISP 70% ISPRP (MISCELLANEOUS) ×1
BAG COUNTER SPONGE SURGICOUNT (BAG) IMPLANT
BAG SPNG CNTER NS LX DISP (BAG)
BLADE EXTENDED COATED 6.5IN (ELECTRODE) ×1 IMPLANT
BNDG GAUZE DERMACEA FLUFF 4 (GAUZE/BANDAGES/DRESSINGS) IMPLANT
BNDG GZE DERMACEA 4 6PLY (GAUZE/BANDAGES/DRESSINGS) ×1
CATH ROBINSON RED A/P 16FR (CATHETERS) IMPLANT
CELLS DAT CNTRL 66122 CELL SVR (MISCELLANEOUS) IMPLANT
CHLORAPREP W/TINT 26 (MISCELLANEOUS) ×1 IMPLANT
CLIP TI LARGE 6 (CLIP) ×1 IMPLANT
CLIP TI MEDIUM 6 (CLIP) ×1 IMPLANT
CLIP TI MEDIUM LARGE 6 (CLIP) ×1 IMPLANT
CNTNR URN SCR LID CUP LEK RST (MISCELLANEOUS) IMPLANT
CONT SPEC 4OZ STRL OR WHT (MISCELLANEOUS)
DERMABOND ADVANCED .7 DNX12 (GAUZE/BANDAGES/DRESSINGS) IMPLANT
DRAPE INCISE IOBAN 66X45 STRL (DRAPES) IMPLANT
DRAPE SURG IRRIG POUCH 19X23 (DRAPES) ×1 IMPLANT
DRAPE WARM FLUID 44X44 (DRAPES) ×1 IMPLANT
DRSG OPSITE POSTOP 4X10 (GAUZE/BANDAGES/DRESSINGS) IMPLANT
DRSG OPSITE POSTOP 4X6 (GAUZE/BANDAGES/DRESSINGS) IMPLANT
DRSG OPSITE POSTOP 4X8 (GAUZE/BANDAGES/DRESSINGS) IMPLANT
DRSG TEGADERM 6X8 (GAUZE/BANDAGES/DRESSINGS) IMPLANT
ELECT REM PT RETURN 15FT ADLT (MISCELLANEOUS) ×1 IMPLANT
GAUZE 4X4 16PLY ~~LOC~~+RFID DBL (SPONGE) IMPLANT
GLOVE BIO SURGEON STRL SZ 6 (GLOVE) ×2 IMPLANT
GLOVE BIO SURGEON STRL SZ 6.5 (GLOVE) ×1 IMPLANT
GOWN STRL REUS W/ TWL LRG LVL3 (GOWN DISPOSABLE) ×2 IMPLANT
GOWN STRL REUS W/TWL LRG LVL3 (GOWN DISPOSABLE) ×2
HEMOSTAT ARISTA ABSORB 3G PWDR (HEMOSTASIS) IMPLANT
KIT BASIN OR (CUSTOM PROCEDURE TRAY) ×1 IMPLANT
KIT TURNOVER KIT A (KITS) IMPLANT
LIGASURE IMPACT 36 18CM CVD LR (INSTRUMENTS) IMPLANT
LOOP VESSEL MAXI BLUE (MISCELLANEOUS) IMPLANT
NDL HYPO 21X1.5 SAFETY (NEEDLE) ×2 IMPLANT
NEEDLE HYPO 21X1.5 SAFETY (NEEDLE) IMPLANT
NS IRRIG 1000ML POUR BTL (IV SOLUTION) ×2 IMPLANT
PACK GENERAL/GYN (CUSTOM PROCEDURE TRAY) ×1 IMPLANT
RELOAD PROXIMATE 75MM BLUE (ENDOMECHANICALS) IMPLANT
RELOAD PROXIMATE TA60MM BLUE (ENDOMECHANICALS) IMPLANT
RELOAD STAPLE 60 BLU REG PROX (ENDOMECHANICALS) IMPLANT
RELOAD STAPLE 75 3.8 BLU REG (ENDOMECHANICALS) IMPLANT
RETRACTOR WND ALEXIS 18 MED (MISCELLANEOUS) IMPLANT
RETRACTOR WND ALEXIS 25 LRG (MISCELLANEOUS) IMPLANT
RTRCTR WOUND ALEXIS 18CM MED (MISCELLANEOUS)
RTRCTR WOUND ALEXIS 25CM LRG (MISCELLANEOUS)
SHEET LAVH (DRAPES) ×1 IMPLANT
SLEEVE SUCTION CATH 165 (SLEEVE) ×1 IMPLANT
SOL PREP POV-IOD 4OZ 10% (MISCELLANEOUS) ×1 IMPLANT
STAPLER GUN LINEAR PROX 60 (STAPLE) IMPLANT
STAPLER PROXIMATE 75MM BLUE (STAPLE) IMPLANT
STAPLER VISISTAT 35W (STAPLE) IMPLANT
SURGIFLO W/THROMBIN 8M KIT (HEMOSTASIS) IMPLANT
SUT MNCRL AB 4-0 PS2 18 (SUTURE) ×2 IMPLANT
SUT PDS AB 1 TP1 96 (SUTURE) ×2 IMPLANT
SUT SILK 3 0 SH CR/8 (SUTURE) IMPLANT
SUT VIC AB 0 CT1 36 (SUTURE) ×4 IMPLANT
SUT VIC AB 2-0 CT1 27 (SUTURE)
SUT VIC AB 2-0 CT1 36 (SUTURE) ×2 IMPLANT
SUT VIC AB 2-0 CT1 TAPERPNT 27 (SUTURE) ×2 IMPLANT
SUT VIC AB 2-0 CT2 27 (SUTURE) ×6 IMPLANT
SUT VIC AB 2-0 SH 27 (SUTURE)
SUT VIC AB 2-0 SH 27X BRD (SUTURE) IMPLANT
SUT VIC AB 3-0 CTX 36 (SUTURE) IMPLANT
SUT VIC AB 3-0 SH 18 (SUTURE) IMPLANT
SUT VIC AB 3-0 SH 27 (SUTURE)
SUT VIC AB 3-0 SH 27X BRD (SUTURE) ×1 IMPLANT
SWAB CULTURE ESWAB REG 1ML (MISCELLANEOUS) IMPLANT
SYR 30ML LL (SYRINGE) ×2 IMPLANT
TOWEL OR 17X26 10 PK STRL BLUE (TOWEL DISPOSABLE) ×1 IMPLANT
TOWEL OR NON WOVEN STRL DISP B (DISPOSABLE) ×1 IMPLANT
TRAY FOLEY MTR SLVR 16FR STAT (SET/KITS/TRAYS/PACK) ×1 IMPLANT
UNDERPAD 30X36 HEAVY ABSORB (UNDERPADS AND DIAPERS) ×1 IMPLANT

## 2022-01-12 NOTE — Discharge Instructions (Signed)
For questions /concerns may call Interventional Radiology at [phone] or  Interventional Radiology clinic [phone]   You may remove your dressing and shower tomorrow afternoon  DO NOT use EMLA cream for 2 weeks after port placement as the cream will remove surgical glue on your incision.  Moderate Conscious Sedation, Adult, Care After This sheet gives you information about how to care for yourself after your procedure. Your health care provider may also give you more specific instructions. If you have problems or questions, contact your health careprovider. What can I expect after the procedure? After the procedure, it is common to have: Sleepiness for several hours. Impaired judgment for several hours. Difficulty with balance. Vomiting if you eat too soon. Follow these instructions at home: For the time period you were told by your health care provider: Rest. Do not participate in activities where you could fall or become injured. Do not drive or use machinery. Do not drink alcohol. Do not take sleeping pills or medicines that cause drowsiness. Do not make important decisions or sign legal documents. Do not take care of children on your own. Eating and drinking  Follow the diet recommended by your health care provider. Drink enough fluid to keep your urine pale yellow. If you vomit: Drink water, juice, or soup when you can drink without vomiting. Make sure you have little or no nausea before eating solid foods.  General instructions Take over-the-counter and prescription medicines only as told by your health care provider. Have a responsible adult stay with you for the time you are told. It is important to have someone help care for you until you are awake and alert. Do not smoke. Keep all follow-up visits as told by your health care provider. This is important. Contact a health care provider if: You are still sleepy or having trouble with balance after 24 hours. You feel  light-headed. You keep feeling nauseous or you keep vomiting. You develop a rash. You have a fever. You have redness or swelling around the IV site. Get help right away if: You have trouble breathing. You have new-onset confusion at home. Summary After the procedure, it is common to feel sleepy, have impaired judgment, or feel nauseous if you eat too soon. Rest after you get home. Know the things you should not do after the procedure. Follow the diet recommended by your health care provider and drink enough fluid to keep your urine pale yellow. Get help right away if you have trouble breathing or new-onset confusion at home. This information is not intended to replace advice given to you by your health care provider. Make sure you discuss any questions you have with your healthcare provider. Document Revised: 04/26/2019 Document Reviewed: 11/22/2018 Elsevier Patient Education  2022 [employer]For questions /concerns may call Interventional Radiology at [phone] or  Interventional Radiology clinic [phone]   You may remove your dressing and shower tomorrow afternoon  DO NOT use EMLA cream for 2 weeks after port placement as the cream will remove surgical glue on your incision.    Implanted Port Insertion, Care After This sheet gives you information about how to care for yourself after your procedure. Your health care provider may also give you more specific instructions. If you have problems or questions, contact your health careprovider. What can I expect after the procedure? After the procedure, it is common to have: Discomfort at the port insertion site. Bruising on the skin over the port. This should improve over 3-4 days. Follow these instructions  at home: [facility] care After your port is placed, you will get a manufacturer's information card. The card has information about your port. Keep this card with you at all times. Take care of the port as told by your health care  provider. Ask your health care provider if you or a family member can get training for taking care of the port at home. A home health care nurse may also take care of the port. Make sure to remember what type of port you have. Incision care Follow instructions from your health care provider about how to take care of your port insertion site. Make sure you: Wash your hands with soap and water before and after you change your bandage (dressing). If soap and water are not available, use hand sanitizer. Change your dressing as told by your health care provider. Leave skin glue, or adhesive strips in place. These skin closures may need to stay in place for 2 weeks or longer.  Check your port insertion site every day for signs of infection. Check for:      - Redness, swelling, or pain.                     - Fluid or blood.      - Warmth.      - Pus or a bad smell. Activity Return to your normal activities as told by your health care provider. Ask your health care provider what activities are safe for you. Do not lift anything that is heavier than 10 lb (4.5 kg), or the limit that you are told, until your health care provider says that it is safe. General instructions Take over-the-counter and prescription medicines only as told by your health care provider. Do not take baths, swim, or use a hot tub until your health care provider approves. Ask your health care provider if you may take showers. You may only be allowed to take sponge baths. Do not drive for 24 hours if you were given a sedative during your procedure. Wear a medical alert bracelet in case of an emergency. This will tell any health care providers that you have a port. Keep all follow-up visits as told by your health care provider. This is important. Contact a health care provider if: You cannot flush your port with saline as directed, or you cannot draw blood from the port. You have a fever or chills. You have redness, swelling, or  pain around your port insertion site. You have fluid or blood coming from your port insertion site. Your port insertion site feels warm to the touch. You have pus or a bad smell coming from the port insertion site. Get help right away if: You have chest pain or shortness of breath. You have bleeding from your port that you cannot control. Summary Take care of the port as told by your health care provider. Keep the manufacturer's information card with you at all times. Change your dressing as told by your health care provider. Contact a health care provider if you have a fever or chills or if you have redness, swelling, or pain around your port insertion site. Keep all follow-up visits as told by your health care provider. This information is not intended to replace advice given to you by your health care provider. Make sure you discuss any questions you have with your healthcare provider. Document Revised: 07/25/2017 Document Reviewed: 07/25/2017

## 2022-01-12 NOTE — Procedures (Signed)
Interventional Radiology Procedure Note  Procedure: Single Lumen Power Port Placement    Access:  Right IJ vein.  Findings: Catheter tip positioned at SVC/RA junction. Port is ready for immediate use.   Complications: None  EBL: < 10 mL  Recommendations:  - Ok to shower in 24 hours - Do not submerge for 7 days - Routine line care   Clelia Trabucco T. Tyson Parkison, M.D Pager:  [phone]   

## 2022-01-12 NOTE — Discharge Instructions (Addendum)
Contacts: For questions or concerns you should contact:  Dr. Jeral Pinch at [phone] After hours and on week-ends call [phone] and ask to speak to the physician on call for Gynecologic  Oncology

## 2022-01-12 NOTE — Telephone Encounter (Signed)
Attempted f/u call. No answer, left VM. 

## 2022-01-12 NOTE — Anesthesia Preprocedure Evaluation (Addendum)
Anesthesia Evaluation  Patient identified by MRN, date of birth, ID band Patient awake    Reviewed: Allergy & Precautions, NPO status , Patient's Chart, lab work & pertinent test results  History of Anesthesia Complications (+) PONV  Airway Mallampati: I  TM Distance: >3 FB Neck ROM: Full    Dental  (+) Edentulous Upper, Edentulous Lower   Pulmonary former smoker   breath sounds clear to auscultation       Cardiovascular (-) angina negative cardio ROS  Rhythm:Regular Rate:Normal     Neuro/Psych   Anxiety Depression    negative neurological ROS     GI/Hepatic Neg liver ROS,GERD  Medicated and Controlled,,  Endo/Other  negative endocrine ROS    Renal/GU Renal InsufficiencyRenal disease     Musculoskeletal  (+) Arthritis ,    Abdominal   Peds  Hematology  (+) Blood dyscrasia (Hb 9.8), anemia eliquis   Anesthesia Other Findings H/o breast cancer  Reproductive/Obstetrics                             Anesthesia Physical Anesthesia Plan  ASA: 3  Anesthesia Plan: General   Post-op Pain Management:    Induction: Intravenous  PONV Risk Score and Plan: 4 or greater and Ondansetron, Dexamethasone, Diphenhydramine and Treatment may vary due to age or medical condition  Airway Management Planned: Oral ETT  Additional Equipment: None  Intra-op Plan:   Post-operative Plan: Extubation in OR  Informed Consent: I have reviewed the patients History and Physical, chart, labs and discussed the procedure including the risks, benefits and alternatives for the proposed anesthesia with the patient or authorized representative who has indicated his/her understanding and acceptance.       Plan Discussed with: CRNA and Surgeon  Anesthesia Plan Comments:        Anesthesia Quick Evaluation

## 2022-01-12 NOTE — Anesthesia Postprocedure Evaluation (Signed)
Anesthesia Post Note  Patient: Kayla Mcgrath  Procedure(s) Performed: incision drainage of abdominal incision wound (Abdomen)     Patient location during evaluation: PACU Anesthesia Type: General Level of consciousness: awake and alert, patient cooperative and oriented Pain management: pain level controlled Vital Signs Assessment: post-procedure vital signs reviewed and stable Respiratory status: spontaneous breathing, nonlabored ventilation and respiratory function stable Cardiovascular status: blood pressure returned to baseline and stable Postop Assessment: no apparent nausea or vomiting Anesthetic complications: no   No notable events documented.  Last Vitals:  Vitals:   01/12/22 1730 01/12/22 1745  BP: (!) 141/73 (!) 145/80  Pulse: 75 80  Resp: 19 20  Temp:    SpO2: 94% 93%    Last Pain:  Vitals:   01/12/22 1745  PainSc: 1                  Venita Seng,E. Chemeka Filice

## 2022-01-12 NOTE — Interval H&P Note (Signed)
History and Physical Interval Note:  01/12/2022 3:19 PM  Kayla Mcgrath  has presented today for surgery, with the diagnosis of abdominal incision infection.  The various methods of treatment have been discussed with the patient and family. After consideration of risks, benefits and other options for treatment, the patient has consented to  Procedure(s): incision drainage of abdominal incision wound (N/A) as a surgical intervention.  The patient's history has been reviewed, patient examined, no change in status, stable for surgery.  I have reviewed the patient's chart and labs.  Questions were answered to the patient's satisfaction.     Lahoma Crocker

## 2022-01-12 NOTE — Transfer of Care (Signed)
Immediate Anesthesia Transfer of Care Note  Patient: Kayla Mcgrath  Procedure(s) Performed: incision drainage of abdominal incision wound (Abdomen)  Patient Location: PACU  Anesthesia Type:General  Level of Consciousness: awake, alert , oriented, drowsy, and patient cooperative  Airway & Oxygen Therapy: Patient Spontanous Breathing and Patient connected to nasal cannula oxygen  Post-op Assessment: Report given to RN and Post -op Vital signs reviewed and stable  Post vital signs: Reviewed and stable  Last Vitals:  Vitals Value Taken Time  BP 166/71 01/12/22 1634  Temp    Pulse 75 01/12/22 1635  Resp 9 01/12/22 1635  SpO2 100 % 01/12/22 1635  Vitals shown include unvalidated device data.  Last Pain: There were no vitals filed for this visit.       Complications: No notable events documented.

## 2022-01-12 NOTE — Consult Note (Signed)
Chief Complaint: Patient was seen in consultation today for  port a cath placement  Referring Physician(s): [place]  Supervising Physician: Aletta Edouard  Patient Status: [facility] - Out-pt  History of Present Illness: Kayla Mcgrath is a 77 y.o. female with past medical history of anemia, anxiety, depression, arthritis, breast cancer with prior right mastectomy, diverticulosis, hyperlipidemia, skin cancer and newly diagnosed uterine carcinosarcoma, s/p surgery on 12/22/2021.  Past Medical History:  Diagnosis Date   Adjustment disorder with anxious mood 12/05/2018   Anemia    Anxiety    Arthralgia 06/29/2015   Arthritis    Breast cancer ([place]) 2010   Colon polyps    ?   Complication of anesthesia    DCIS (ductal carcinoma in situ) of breast 2010   Depression    Diverticulosis    Full dentures    Hyperlipidemia    Joint pain    Leukocytosis 07/11/2017   PONV (postoperative nausea and vomiting)    Woke up during surgery   Skin cancer    SCC   Wears glasses     Past Surgical History:  Procedure Laterality Date   BREAST BIOPSY Right 05/11/2012   BREAST BIOPSY Right 04/24/2012   BREAST LUMPECTOMY Right 12/2008   cancer-tamoxifen   BREAST RECONSTRUCTION WITH PLACEMENT OF TISSUE EXPANDER AND FLEX HD (ACELLULAR HYDRATED DERMIS) Right 06/20/2012   Procedure: BREAST RECONSTRUCTION WITH PLACEMENT OF TISSUE EXPANDER AND FLEX HD (ACELLULAR HYDRATED DERMIS);  Surgeon: Theodoro Kos, DO;  Location: [place];  Service: Plastics;  Laterality: Right;   BREAST REDUCTION WITH MASTOPEXY Left 10/11/2012   Procedure: LEFT BREAST REDUCTION WITH MASTOPEXY;  Surgeon: Theodoro Kos, DO;  Location: [place];  Service: Plastics;  Laterality: Left;   cataract sugery Bilateral 03/2019   COLONOSCOPY  08/28/2013   [place]   DILATION AND CURETTAGE OF UTERUS     heavy bleeding between two pregnancies   LIPOSUCTION WITH LIPOFILLING Left 10/11/2012   Procedure:  LIPOSUCTION WITH LIPOFILLING;  Surgeon: Theodoro Kos, DO;  Location: [place];  Service: Plastics;  Laterality: Left;   LYMPH NODE DISSECTION N/A 12/22/2021   Procedure: LYMPH NODE DISSECTION;  Surgeon: Lafonda Mosses, MD;  Location: [facility];  Service: Gynecology;  Laterality: N/A;   MASTECTOMY Right 06/20/2012   MASTECTOMY W/ SENTINEL NODE BIOPSY Right 06/20/2012   Procedure: right skin sparing MASTECTOMY WITH SENTINEL LYMPH NODE BIOPSY;  Surgeon: Stark Klein, MD;  Location: [place];  Service: General;  Laterality: Right;   PARATHYROIDECTOMY Left 11/07/2017   Procedure: NECK EXPLORATION WITH LEFT THYROID LOBECTOMY;  Surgeon: [name], MD;  Location: [facility];  Service: General;  Laterality: Left;   POLYPECTOMY     REDUCTION MAMMAPLASTY Left 10/2012   REMOVAL OF TISSUE EXPANDER AND PLACEMENT OF IMPLANT Right 10/11/2012   Procedure: REMOVAL OF TISSUE EXPANDER AND PLACEMENT OF SILICONE IMPLANT RIGHT;  Surgeon: Theodoro Kos, DO;  Location: [place];  Service: Plastics;  Laterality: Right;    Allergies: Tylenol [acetaminophen] and Latex  Medications: Prior to Admission medications   Medication Sig Start Date End Date Taking? Authorizing Provider  escitalopram (LEXAPRO) 10 MG tablet Take 1 tablet (10 mg total) by mouth daily. 01/15/21  Yes Nche, Charlene Brooke, NP  famotidine (PEPCID) 20 MG tablet Take 20 mg by mouth 2 (two) times daily as needed for heartburn or indigestion.   Yes [provider]  pantoprazole (PROTONIX) 40 MG tablet Take 1 tablet (40 mg total)  by mouth 2 (two) times daily. 01/07/22 03/08/22 Yes Thornton Park, MD  traMADol (ULTRAM) 50 MG tablet Take 1 tablet (50 mg total) by mouth every 12 (twelve) hours as needed for severe pain. For AFTER surgery only, do not take and drive 54/6/27  Yes Cross, Lenna Sciara D, NP  apixaban (ELIQUIS) 2.5 MG TABS tablet Take 1 tablet (2.5 mg total) by mouth 2 (two) times daily. For  AFTER surgery, plan to start taking 12/24/21 in the am Patient not taking: Reported on 01/07/2022 12/24/21   Joylene John D, NP  Cholecalciferol (VITAMIN D) 2000 UNITS CAPS Take 2,000 Units by mouth daily. Take one by mouth daily Patient not taking: Reported on 01/05/2022    [provider]  magnesium 30 MG tablet Take 30 mg by mouth daily. Patient not taking: Reported on 01/05/2022    [provider]  OVER THE COUNTER MEDICATION Apply 1 application  topically as needed (knee pain). CBD Cream- Patient not taking: Reported on 01/07/2022    [provider]  senna-docusate (SENOKOT-S) 8.6-50 MG tablet Take 2 tablets by mouth at bedtime. For AFTER surgery, do not take if having diarrhea Patient not taking: Reported on 01/05/2022 12/10/21   Joylene John D, NP  vitamin B-12 (CYANOCOBALAMIN) 1000 MCG tablet Take 1,000 mcg by mouth daily at 2 PM. Patient not taking: Reported on 01/05/2022 10/19/20   [provider]     Family History  Problem Relation Age of Onset   Alcohol abuse Mother    Colon polyps Mother    Mental illness Mother    Alcohol abuse Father        suicide   Mental illness Father    Cancer Sister        gyn cancer   Heart disease Paternal Grandfather    Colon cancer Neg Hx    Esophageal cancer Neg Hx    Rectal cancer Neg Hx    Stomach cancer Neg Hx    Breast cancer Neg Hx    Ovarian cancer Neg Hx    Endometrial cancer Neg Hx    Pancreatic cancer Neg Hx    Prostate cancer Neg Hx     Social History   Socioeconomic History   Marital status: Married    Spouse name: Not on file   Number of children: 2   Years of education: Not on file   Highest education level: Not on file  Occupational History   Occupation: retired LPN  Tobacco Use   Smoking status: Former    Types: Cigarettes    Quit date: 06/14/1981    Years since quitting: 40.6   Smokeless tobacco: Never  Vaping Use   Vaping Use: Never used  Substance and Sexual  Activity   Alcohol use: No   Drug use: No   Sexual activity: Not Currently    Birth control/protection: Post-menopausal  Other Topics Concern   Not on file  Social History Narrative   Recently moved from [place] to be closer to her two daughters and grandchildren.   Retired Corporate treasurer.   DNR- forms singed on 05/07/2012 and returned to pt.   Social Determinants of Health   Financial Resource Strain: Low Risk  (02/02/2021)   Overall Financial Resource Strain (CARDIA)    Difficulty of Paying Living Expenses: Not hard at all  Food Insecurity: No Food Insecurity (11/30/2021)   Hunger Vital Sign    Worried About Running Out of Food in the Last Year: Never true    Ran Out  of Food in the Last Year: Never true  Transportation Needs: No Transportation Needs (11/30/2021)   PRAPARE - Hydrologist (Medical): No    Lack of Transportation (Non-Medical): No  Physical Activity: Insufficiently Active (02/02/2021)   Exercise Vital Sign    Days of Exercise per Week: 3 days    Minutes of Exercise per Session: 30 min  Stress: No Stress Concern Present (02/02/2021)   [place]    Feeling of Stress : Not at all  Social Connections: Moderately Integrated (02/02/2021)   Social Connection and Isolation Panel [NHANES]    Frequency of Communication with Friends and Family: Twice a week    Frequency of Social Gatherings with Friends and Family: Twice a week    Attends Religious Services: Never    Marine scientist or Organizations: Yes    Attends Music therapist: More than 4 times per year    Marital Status: Married      Review of Systems currently denies fever, headache, chest pain, dyspnea, cough, back pain, nausea, vomiting or bleeding.  Does have some mid abdominal tenderness at previous surgical wound site  Vital Signs:pending Ht '4\' 11"'$  (1.499 m)   Wt 145 lb (65.8 kg)   BMI 29.29 kg/m        Physical Exam awake, alert.  Chest clear to auscultation bilaterally.  Heart with normal rate, occasional ectopy.  Abdomen soft, midline wound covered with clean gauze dressing, site mildly tender to palpation.  No significant lower extremity edema.  Imaging: No results found.  Labs:  CBC: Recent Labs    12/16/21 1000 12/23/21 0532 12/31/21 0955 01/05/22 1425  WBC 9.5 12.7* 11.7* 9.1  HGB 10.5* 9.1* 7.4* 9.8*  HCT 33.3* 29.0* 22.8* 29.5*  PLT 399 281 277 353.0    COAGS: Recent Labs    11/30/21 2321 12/01/21 0551  INR 1.3* 1.3*    BMP: Recent Labs    12/01/21 0551 12/02/21 0725 12/16/21 1000 12/23/21 0532 12/23/21 0850  NA 141 141 138 136  --   K 3.9 4.1 4.3 4.9  --   CL 109 109 106 103  --   CO2 '27 24 22 24  '$ --   GLUCOSE 99 91 92 144*  --   BUN 43* 40* 21 18  --   CALCIUM 8.4* 8.4* 8.8* 8.4*  --   CREATININE 2.98* 2.63* 1.16* 1.35* 1.29*  GFRNONAA 16* 19* 49* 41* 43*    LIVER FUNCTION TESTS: Recent Labs    11/30/21 1727 12/01/21 0551 12/02/21 0725 12/16/21 1000  BILITOT 0.7 0.4 0.5 0.8  AST 72* 53* 50* 27  ALT 69* 57* 55* 24  ALKPHOS 129* 100 91 85  PROT 7.2 5.8* 6.0* 7.4  ALBUMIN 2.4* 2.0* 2.0* 3.0*    TUMOR MARKERS: No results for input(s): "AFPTM", "CEA", "CA199", "CHROMGRNA" in the last 8760 hours.  Assessment and Plan: 77 y.o. female with past medical history of anemia, anxiety, depression, arthritis, breast cancer with prior right mastectomy, diverticulosis, hyperlipidemia, skin cancer and newly diagnosed uterine carcinosarcoma, s/p surgery on 12/22/2021.Risks and benefits of image guided port-a-catheter placement was discussed with the patient /daughter including, but not limited to bleeding, infection, pneumothorax, or fibrin sheath development and need for additional procedures.  All of the patient's questions were answered, patient is agreeable to proceed. Consent signed and in chart.  Patient scheduled for I&D of midline  wound site today following port  placement   Thank you for this interesting consult.  I greatly enjoyed meeting Kayla Mcgrath and look forward to participating in their care.  A copy of this report was sent to the requesting provider on this date.  Electronically Signed: D. Rowe Robert, PA-C 01/12/2022, 12:09 PM   I spent a total of 25 minutes    in face to face in clinical consultation, greater than 50% of which was counseling/coordinating care for Port-A-Cath placement

## 2022-01-12 NOTE — H&P (Signed)
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 Pelvic sonogram is otherwise unremarkable.    11/11/2021 Pathology Results   A. CERVIX, MASS, EXCISION:  - HIGH-GRADE UNDIFFERENTIATED MALIGNANCY WITH EXTENSIVE NECROSIS (SEE COMMENT).   COMMENT:  The tumor consists of mostly high-grade sarcomatoid morphology with focal epithelioid morphology.  Immunohistochemical stains are performed on block A4.  The tumor cells are positive for desmin and p16 while negative for CD45, CK7, CK20, chromogranin, synaptophysin, Melan-A, p40, AE1/AE3, SMA, p63, cyclin D1, S100 and smooth muscle myosin.  Ki-67 proliferation index is high.  CD10 staining shows focal nonspecific staining.  The differential diagnosis includes a high-grade sarcoma and a partially sampled carcinosarcoma.    11/25/2021 Imaging   MRI pelvis Marked distention of the entire endometrial cavity by heterogeneously enhancing soft tissue density, which extends through the endocervical canal into the lower vagina. This is highly suspicious for endometrial carcinoma.   Diffuse myometrial thinning due to marked distention of endometrial cavity limits evaluation; deep myometrial invasion cannot be excluded in the uterine fundus.   No evidence of extra-uterine extension of tumor.   Lymphadenopathy in lower abdominal retroperitoneum, bilateral iliac chains, and sigmoid mesocolon, consistent with metastatic disease.   Sigmoid diverticulosis. No radiographic evidence of diverticulitis.   11/26/2021 Initial Diagnosis   Uterine cancer ([place])   11/26/2021 Cancer Staging   Staging form: Corpus Uteri - Carcinoma and Carcinosarcoma, AJCC 8th Edition - Clinical stage from 11/26/2021: FIGO Stage IIIC2 (cT3, cN2, cM0) -  Signed by Heath Lark, MD on 11/26/2021 Stage prefix: Initial diagnosis Histologic grade (G): G3 Histologic grading system: 3 grade system   11/30/2021 Pathology Results   FINAL MICROSCOPIC DIAGNOSIS:  A. UTERINE CONTENTS, BIOPSY: - HIGH-GRADE UNDIFFERENTIATED MALIGNANCY WITH EXTENSIVE NECROSIS (SEE COMMENT).  COMMENT: The patient's history of a high-grade undifferentiated malignancy with extensive necrosis of the cervix is noted.  The morphological features of the current tumor cells are similar to the tumor cells seen in the previous cervical mass excision.  Immunohistochemical staining for desmin and cytokeratin AE1/AE3 is performed on block A1.  The tumor cells are diffusely positive for desmin.   AE1/AE3 stain is focally positive, however this is scant and of unclear clinical significance. Given the small quantity of tissue in the current biopsy, a definitive distinction between a high-grade sarcoma and carcinosarcoma cannot be made as this biopsy may not be representative of the entire tumor. If clinically indicated, this case as well as the previous cervical mass excision ([id number]) may be sent out for expert consultation. Clinical correlation recommended.     12/01/2021 Tumor Marker   Patient's tumor was tested for the following markers: CA-125. Results of the tumor marker test revealed 491.   12/16/2021 - 12/16/2021 Chemotherapy   Patient is on Treatment Plan : UTERINE Carboplatin AUC 6 + Paclitaxel q21d     12/22/2021 Surgery   Robotic-assisted laparoscopic total hysterectomy with bilateral salpingo-oophorectomy, tumor debulking including high common iliac lymph node excision, aborted attempt of bilateral external iliac enlarged lymph nodes, mini-lap for specimen delivery, cystoscopy    12/22/2021 Pathology Results   FINAL MICROSCOPIC DIAGNOSIS:  A. LEFT CERVICAL MARGIN, EXCISION: - Positive for carcinoma  B. HIGH LEFT COMMON ILIAC LYMPH NODE, EXCISION: - Metastatic  carcinosarcoma involving the lymph node  C. 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, including undifferentiated sarcoma with rhabdoid features and high-grade serous carcinoma, see comment - Tumor invades for a depth of 1 mm where myometrial thickness is 5 mm - Benign bilateral fallopian tubes and ovaries - See oncology table - See comment  ONCOLOGY TABLE:  UTERUS, CARCINOMA OR CARCINOSARCOMA: Resection  Procedure: Total hysterectomy and bilateral salpingo-oophorectomy Histologic Type: Carcinosarcoma (mixed malignant Mullerian tumor) Histologic Grade: High-grade Myometrial Invasion:      Depth of Myometrial Invasion (mm): 1 mm      Myometrial Thickness (mm): 5 mm      Percentage of Myometrial Invasion: 20% Uterine Serosa Involvement: Not identified Cervical stromal Involvement: Present Extent of involvement of other tissue/organs: Not identified Peritoneal/Ascitic Fluid: Not applicable Lymphovascular Invasion: Present Regional Lymph Nodes:      Pelvic Lymph Nodes Examined:                                  0 Sentinel                                  1 Non-sentinel                                  1 Total      Pelvic Lymph Nodes with Metastasis: 1                          Macrometastasis: (>2.0 mm): 1                          Micrometastasis: (>0.2 mm and < 2.0 mm): 0                          Isolated Tumor Cells (<0.2 mm): 0                          Laterality of Lymph Node with Tumor: Left                          Extracapsular Extension: Not identified      Para-aortic Lymph Nodes Examined:                                   0 Sentinel                                   0 non-sentinel                                   0 total Distant Metastasis:      Distant Site(s) Involved: Not applicable Pathologic Stage Classification (pTNM, AJCC 8th Edition): pT1a, pN1 Ancillary Studies: MMR / MSI  testing will be ordered Representative Tumor Block: C1 Comment(s): None  ([id number])       01/21/2022 -  Chemotherapy   Patient is on Treatment Plan : UTERINE SEROUS  CARCINOMA Carboplatin + Paclitaxel + Trastuzumab q21d x 6 Cycles / Trastuzumab q21d       Interval History: Patient presents for port placement today. Reports some drainage from her incision starting this morning. Some discomfort prior to drainage, but she felt relief when started draining. Describes drainage as fluid. Denies any fevers or chills. Denies nausea or emesis, appetite has been good.   Past Medical/Surgical History: Past Medical History:  Diagnosis Date   Adjustment disorder with anxious mood 12/05/2018   Anemia    Anxiety    Arthralgia 06/29/2015   Arthritis    Breast cancer ([place]) 2010   Colon polyps    ?   Complication of anesthesia    DCIS (ductal carcinoma in situ) of breast 2010   Depression    Diverticulosis    Full dentures    Hyperlipidemia    Joint pain    Leukocytosis 07/11/2017   PONV (postoperative nausea and vomiting)    Woke up during surgery   Skin cancer    SCC   Wears glasses     Past Surgical History:  Procedure Laterality Date   BREAST BIOPSY Right 05/11/2012   BREAST BIOPSY Right 04/24/2012   BREAST LUMPECTOMY Right 12/2008   cancer-tamoxifen   BREAST RECONSTRUCTION WITH PLACEMENT OF TISSUE EXPANDER AND FLEX HD (ACELLULAR HYDRATED DERMIS) Right 06/20/2012   Procedure: BREAST RECONSTRUCTION WITH PLACEMENT OF TISSUE EXPANDER AND FLEX HD (ACELLULAR HYDRATED DERMIS);  Surgeon: Theodoro Kos, DO;  Location: [facility];  Service: Plastics;  Laterality: Right;   BREAST REDUCTION WITH MASTOPEXY Left 10/11/2012   Procedure: LEFT BREAST REDUCTION WITH MASTOPEXY;  Surgeon: Theodoro Kos, DO;  Location: [facility];  Service: Plastics;  Laterality: Left;   cataract sugery Bilateral 03/2019   COLONOSCOPY  08/28/2013   [facility]   DILATION AND CURETTAGE OF  UTERUS     heavy bleeding between two pregnancies   LIPOSUCTION WITH LIPOFILLING Left 10/11/2012   Procedure: LIPOSUCTION WITH LIPOFILLING;  Surgeon: Theodoro Kos, DO;  Location: [facility];  Service: Plastics;  Laterality: Left;   LYMPH NODE DISSECTION N/A 12/22/2021   Procedure: LYMPH NODE DISSECTION;  Surgeon: Lafonda Mosses, MD;  Location: [facility];  Service: Gynecology;  Laterality: N/A;   MASTECTOMY Right 06/20/2012   MASTECTOMY W/ SENTINEL NODE BIOPSY Right 06/20/2012   Procedure: right skin sparing MASTECTOMY WITH SENTINEL LYMPH NODE BIOPSY;  Surgeon: Stark Klein, MD;  Location: [facility];  Service: General;  Laterality: Right;   PARATHYROIDECTOMY Left 11/07/2017   Procedure: NECK EXPLORATION WITH LEFT THYROID LOBECTOMY;  Surgeon: [name], MD;  Location: [facility];  Service: General;  Laterality: Left;   POLYPECTOMY     REDUCTION MAMMAPLASTY Left 10/2012   REMOVAL OF TISSUE EXPANDER AND PLACEMENT OF IMPLANT Right 10/11/2012   Procedure: REMOVAL OF TISSUE EXPANDER AND PLACEMENT OF SILICONE IMPLANT RIGHT;  Surgeon: Theodoro Kos, DO;  Location: [facility];  Service: Plastics;  Laterality: Right;    Family History  Problem Relation Age of Onset   Alcohol abuse Mother    Colon polyps Mother    Mental illness Mother    Alcohol abuse Father        suicide   Mental illness Father    Cancer Sister        gyn cancer   Heart disease Paternal Grandfather    Colon cancer Neg Hx    Esophageal cancer Neg Hx    Rectal cancer Neg Hx  Stomach cancer Neg Hx    Breast cancer Neg Hx    Ovarian cancer Neg Hx    Endometrial cancer Neg Hx    Pancreatic cancer Neg Hx    Prostate cancer Neg Hx     Social History   Socioeconomic History   Marital status: Married    Spouse name: Not on file   Number of children: 2   Years of education: Not on file   Highest education level: Not on file  Occupational History   Occupation: retired LPN   Tobacco Use   Smoking status: Former    Types: Cigarettes    Quit date: 06/14/1981    Years since quitting: 40.6   Smokeless tobacco: Never  Vaping Use   Vaping Use: Never used  Substance and Sexual Activity   Alcohol use: No   Drug use: No   Sexual activity: Not Currently    Birth control/protection: Post-menopausal  Other Topics Concern   Not on file  Social History Narrative   Recently moved from [place] to be closer to her two daughters and grandchildren.   Retired Corporate treasurer.   DNR- forms singed on 05/07/2012 and returned to pt.   Social Determinants of Health   Financial Resource Strain: Low Risk  (02/02/2021)   Overall Financial Resource Strain (CARDIA)    Difficulty of Paying Living Expenses: Not hard at all  Food Insecurity: No Food Insecurity (11/30/2021)   Hunger Vital Sign    Worried About Running Out of Food in the Last Year: Never true    Ran Out of Food in the Last Year: Never true  Transportation Needs: No Transportation Needs (11/30/2021)   PRAPARE - Hydrologist (Medical): No    Lack of Transportation (Non-Medical): No  Physical Activity: Insufficiently Active (02/02/2021)   Exercise Vital Sign    Days of Exercise per Week: 3 days    Minutes of Exercise per Session: 30 min  Stress: No Stress Concern Present (02/02/2021)   [place]    Feeling of Stress : Not at all  Social Connections: Moderately Integrated (02/02/2021)   Social Connection and Isolation Panel [NHANES]    Frequency of Communication with Friends and Family: Twice a week    Frequency of Social Gatherings with Friends and Family: Twice a week    Attends Religious Services: Never    Marine scientist or Organizations: Yes    Attends Music therapist: More than 4 times per year    Marital Status: Married    Current Medications:  Current Facility-Administered Medications:    [START ON  01/13/2022] ceFAZolin (ANCEF) IVPB 2g/100 mL premix, 2 g, Intravenous, On Call to OR, Cross, Melissa D, NP   povidone-iodine 10 % swab 2 Application, 2 Application, Topical, Once, Cross, Melissa D, NP  Current Outpatient Medications:    apixaban (ELIQUIS) 2.5 MG TABS tablet, Take 1 tablet (2.5 mg total) by mouth 2 (two) times daily. For AFTER surgery, plan to start taking 12/24/21 in the am (Patient not taking: Reported on 01/07/2022), Disp: 28 tablet, Rfl: 0   Cholecalciferol (VITAMIN D) 2000 UNITS CAPS, Take 2,000 Units by mouth daily. Take one by mouth daily (Patient not taking: Reported on 01/05/2022), Disp: , Rfl:    escitalopram (LEXAPRO) 10 MG tablet, Take 1 tablet (10 mg total) by mouth daily., Disp: 90 tablet, Rfl: 3   famotidine (PEPCID) 20 MG tablet, Take 20 mg by mouth  2 (two) times daily as needed for heartburn or indigestion., Disp: , Rfl:    magnesium 30 MG tablet, Take 30 mg by mouth daily. (Patient not taking: Reported on 01/05/2022), Disp: , Rfl:    OVER THE COUNTER MEDICATION, Apply 1 application  topically as needed (knee pain). CBD Cream- (Patient not taking: Reported on 01/07/2022), Disp: , Rfl:    pantoprazole (PROTONIX) 40 MG tablet, Take 1 tablet (40 mg total) by mouth 2 (two) times daily., Disp: 120 tablet, Rfl: 0   senna-docusate (SENOKOT-S) 8.6-50 MG tablet, Take 2 tablets by mouth at bedtime. For AFTER surgery, do not take if having diarrhea (Patient not taking: Reported on 01/05/2022), Disp: 30 tablet, Rfl: 0   traMADol (ULTRAM) 50 MG tablet, Take 1 tablet (50 mg total) by mouth every 12 (twelve) hours as needed for severe pain. For AFTER surgery only, do not take and drive, Disp: 10 tablet, Rfl: 0   vitamin B-12 (CYANOCOBALAMIN) 1000 MCG tablet, Take 1,000 mcg by mouth daily at 2 PM. (Patient not taking: Reported on 01/05/2022), Disp: , Rfl:   Facility-Administered Medications Ordered in Other Encounters:    0.9 %  sodium chloride infusion, , Intravenous, Continuous,  Covington, Jamie R, NP   ceFAZolin (ANCEF) 2-4 GM/100ML-% IVPB, , , ,   Review of Systems: Denies appetite changes, fevers, chills, fatigue, unexplained weight changes. Denies hearing loss, neck lumps or masses, mouth sores, ringing in ears or voice changes. Denies cough or wheezing.  Denies shortness of breath. Denies chest pain or palpitations. Denies leg swelling. Denies abdominal distention, blood in stools, constipation, diarrhea, nausea, vomiting, or early satiety. Denies pain with intercourse, dysuria, frequency, hematuria or incontinence. Denies hot flashes, pelvic pain, vaginal bleeding or vaginal discharge.   Denies joint pain, back pain or muscle pain/cramps. Denies dizziness, headaches, numbness or seizures. Denies swollen lymph nodes or glands, denies easy bruising or bleeding. Denies anxiety, depression, confusion, or decreased concentration.  Physical Exam: There were no vitals taken for this visit. General: Alert, oriented, no acute distress. HEENT: Normocephalic, atraumatic, sclera anicteric. Chest: Clear to auscultation bilaterally.  NO wheezes or rhonchi. Abdomen: soft, nontender.  Normoactive bowel sounds.  No masses or hepatosplenomegaly appreciated.  Midline laparotomy incision with surrounding erythema and induration, some minimal pus able to be expressed. Wound attempted to be explored with q tip with some bleeding but no purulent drainage. Extremities: Grossly normal range of motion.  Warm, well perfused.  No edema bilaterally.  Laboratory & Radiologic Studies:    Latest Ref Rng & Units 01/05/2022    2:25 PM 12/31/2021    9:55 AM 12/23/2021    5:32 AM  CBC  WBC 4.0 - 10.5 K/uL 9.1  11.7  12.7   Hemoglobin 12.0 - 15.0 g/dL 9.8  7.4  9.1   Hematocrit 36.0 - 46.0 % 29.5  22.8  29.0   Platelets 150.0 - 400.0 K/uL 353.0  277  281       Latest Ref Rng & Units 12/23/2021    8:50 AM 12/23/2021    5:32 AM 12/16/2021   10:00 AM  BMP  Glucose 70 - 99 mg/dL  144   92   BUN 8 - 23 mg/dL  18  21   Creatinine 0.44 - 1.00 mg/dL 1.29  1.35  1.16   Sodium 135 - 145 mmol/L  136  138   Potassium 3.5 - 5.1 mmol/L  4.9  4.3   Chloride 98 - 111 mmol/L  103  106  CO2 22 - 32 mmol/L  24  22   Calcium 8.9 - 10.3 mg/dL  8.4  8.8     Assessment & Plan: Kayla Mcgrath is a 77 y.o. woman with Stage IIIC2 carcinosarcoma s/p surgery on 12/13 now with subcutaneous infection of her mini-lap incision.   Discussed with patient my recommendation after port placement for I&D under some anesthesia in the OR. Will send cultures from OR. Plan to leave open, pack, and let incision heal by secondary intent. Will start patient on oral antibiotics post-op given overall non toxic with no systemic symptoms.   Jeral Pinch, MD  Division of Gynecologic Oncology  Department of Obstetrics and Gynecology  [facility]

## 2022-01-12 NOTE — Anesthesia Procedure Notes (Signed)
Procedure Name: LMA Insertion Date/Time: 01/12/2022 3:40 PM  Performed by: Niel Hummer, CRNAPre-anesthesia Checklist: Patient identified, Emergency Drugs available, Suction available and Patient being monitored Patient Re-evaluated:Patient Re-evaluated prior to induction Oxygen Delivery Method: Circle system utilized Preoxygenation: Pre-oxygenation with 100% oxygen Induction Type: IV induction LMA: LMA with gastric port inserted LMA Size: 4.0 Number of attempts: 1 Dental Injury: Teeth and Oropharynx as per pre-operative assessment

## 2022-01-12 NOTE — Op Note (Signed)
DATE OF OPERATION: 01/12/22   PREOPERATIVE DIAGNOSIS: Abdominal wall abscess and wound infection.  POSTOPERATIVE DIAGNOSES: Same  OPERATION PERFORMED: 1.  Incision and drainage of abdominal wall/wound infection abscess. 2.  Debridement of abdominal wall/wound fibrinous exudate, both subcutaneous and fascia.   SURGEON: Lahoma Crocker, MD  ASSISTANT: None.  COMPLICATIONS: None.  SPECIMENS:  Aerobic/anaerobic cultures sent  DESCRIPTION OF OPERATION: After proper consent was obtained, the patient was brought to the operating room and placed on the table in the supine position. An LMA was placed by the anesthetist. The patient tolerated this well. The abdomen was prepped and draped in a sterile manner. The incision was incised with a scalpel. Finger fracture technique was used to open up the rest of the subcutaneous space and skin down to the bottom of the wound, and there were two small abscess cavities.  The fascia was noted to be intact along the length of the incision.    Fibrinopurulent debris and purulence that were in the abscess cavity were submitted for culture analysis. Fibrinous exudate was excised sharply with scissors back to areas of healthy-bleeding tissue. Care was taken to provide the hemostasis with cautery in meticulous fashion at bleeding points.  The wound was copiously irrigated and then packed with Kerlix rolls.  A dressing was applied.  The patient had her anesthesia reversed and was taken to the recovery room postoperatively in good condition.

## 2022-01-12 NOTE — Telephone Encounter (Signed)
Called [name] (daughter) and advised we are rescheduling Kayla Mcgrath's appointment with Dr. Alvy Bimler and Patient Education to 01/20/22 at 2:00 and 4:00.  She verbalized understanding and agreement.

## 2022-01-13 ENCOUNTER — Inpatient Hospital Stay: Payer: Medicare HMO | Admitting: Hematology and Oncology

## 2022-01-13 ENCOUNTER — Inpatient Hospital Stay: Payer: Medicare HMO | Attending: Hematology and Oncology | Admitting: Gynecologic Oncology

## 2022-01-13 ENCOUNTER — Other Ambulatory Visit: Payer: Self-pay

## 2022-01-13 ENCOUNTER — Telehealth: Payer: Self-pay | Admitting: Oncology

## 2022-01-13 ENCOUNTER — Other Ambulatory Visit: Payer: Self-pay | Admitting: Hematology and Oncology

## 2022-01-13 ENCOUNTER — Inpatient Hospital Stay: Payer: Medicare HMO

## 2022-01-13 ENCOUNTER — Encounter (HOSPITAL_COMMUNITY): Payer: Self-pay | Admitting: Obstetrics & Gynecology

## 2022-01-13 VITALS — BP 134/67 | HR 86 | Temp 98.1°F | Resp 20 | Wt 146.0 lb

## 2022-01-13 DIAGNOSIS — Z7189 Other specified counseling: Secondary | ICD-10-CM

## 2022-01-13 DIAGNOSIS — Z90710 Acquired absence of both cervix and uterus: Secondary | ICD-10-CM | POA: Insufficient documentation

## 2022-01-13 DIAGNOSIS — C549 Malignant neoplasm of corpus uteri, unspecified: Secondary | ICD-10-CM | POA: Insufficient documentation

## 2022-01-13 DIAGNOSIS — N179 Acute kidney failure, unspecified: Secondary | ICD-10-CM | POA: Insufficient documentation

## 2022-01-13 DIAGNOSIS — Z90722 Acquired absence of ovaries, bilateral: Secondary | ICD-10-CM

## 2022-01-13 DIAGNOSIS — T8149XA Infection following a procedure, other surgical site, initial encounter: Secondary | ICD-10-CM

## 2022-01-13 DIAGNOSIS — C775 Secondary and unspecified malignant neoplasm of intrapelvic lymph nodes: Secondary | ICD-10-CM | POA: Insufficient documentation

## 2022-01-13 NOTE — Telephone Encounter (Signed)
Called Kayla Mcgrath and advised her that Dr. Alvy Bimler would like her to have an echocardiogram before starting chemotherapy.  Advised her of echo appointment on 01/19/22 at 9:00 at [facility].  Advised her to check in at the admitting desk.

## 2022-01-13 NOTE — Telephone Encounter (Signed)
Called [name] (daughter) and scheduled dressing repack/change for 1:45 today.  She verbalized understanding and agreement of appointment.

## 2022-01-14 ENCOUNTER — Encounter: Payer: Self-pay | Admitting: Hematology and Oncology

## 2022-01-14 ENCOUNTER — Encounter: Payer: Self-pay | Admitting: Gynecologic Oncology

## 2022-01-14 ENCOUNTER — Encounter: Payer: Self-pay | Admitting: Oncology

## 2022-01-14 ENCOUNTER — Other Ambulatory Visit: Payer: Self-pay | Admitting: Gynecologic Oncology

## 2022-01-14 DIAGNOSIS — T8131XD Disruption of external operation (surgical) wound, not elsewhere classified, subsequent encounter: Secondary | ICD-10-CM

## 2022-01-14 NOTE — Progress Notes (Signed)
Orders placed for home health for negative pressure dressing application, management, and changes given open abdominal incision per Dr. Jeral Pinch

## 2022-01-14 NOTE — Progress Notes (Signed)
Patient is eating a regular soft diet.

## 2022-01-14 NOTE — Progress Notes (Signed)
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 Pelvic sonogram is otherwise unremarkable.    11/11/2021 Pathology Results   A. CERVIX, MASS, EXCISION:  - HIGH-GRADE UNDIFFERENTIATED MALIGNANCY WITH EXTENSIVE NECROSIS (SEE COMMENT).   COMMENT:  The tumor consists of mostly high-grade sarcomatoid morphology with focal epithelioid morphology.  Immunohistochemical stains are performed on block A4.  The tumor cells are positive for desmin and p16 while negative for CD45, CK7, CK20, chromogranin, synaptophysin, Melan-A, p40, AE1/AE3, SMA, p63, cyclin D1, S100 and smooth muscle myosin.  Ki-67 proliferation index is high.  CD10 staining shows focal nonspecific staining.  The differential diagnosis includes a high-grade sarcoma and a partially sampled carcinosarcoma.    11/25/2021 Imaging   MRI pelvis Marked distention of the entire endometrial cavity by heterogeneously enhancing soft tissue density, which extends through the endocervical canal into the lower vagina. This is highly suspicious for endometrial carcinoma.   Diffuse myometrial thinning due to marked distention of endometrial cavity limits evaluation; deep myometrial invasion cannot be excluded in the uterine fundus.   No evidence of extra-uterine extension of tumor.   Lymphadenopathy in lower abdominal retroperitoneum, bilateral iliac chains, and sigmoid mesocolon, consistent with metastatic disease.   Sigmoid diverticulosis. No radiographic evidence of diverticulitis.   11/26/2021 Initial Diagnosis   Uterine cancer ([place])   11/26/2021 Cancer Staging   Staging form: Corpus Uteri - Carcinoma and  Carcinosarcoma, AJCC 8th Edition - Clinical stage from 11/26/2021: FIGO Stage IIIC2 (cT3, cN2, cM0) - Signed by Heath Lark, MD on 11/26/2021 Stage prefix: Initial diagnosis Histologic grade (G): G3 Histologic grading system: 3 grade system   11/30/2021 Pathology Results   FINAL MICROSCOPIC DIAGNOSIS:  A. UTERINE CONTENTS, BIOPSY: - HIGH-GRADE UNDIFFERENTIATED MALIGNANCY WITH EXTENSIVE NECROSIS (SEE COMMENT).  COMMENT: The patient's history of a high-grade undifferentiated malignancy with extensive necrosis of the cervix is noted.  The morphological features of the current tumor cells are similar to the tumor cells seen in the previous cervical mass excision.  Immunohistochemical staining for desmin and cytokeratin AE1/AE3 is performed on block A1.  The tumor cells are diffusely positive for desmin.   AE1/AE3 stain is focally positive, however this is scant and of unclear clinical significance. Given the small quantity of tissue in the current biopsy, a definitive distinction between a high-grade sarcoma and carcinosarcoma cannot be made as this biopsy may not be representative of the entire tumor. If clinically indicated, this case as well as the previous cervical mass excision ([id number]) may be sent out for expert consultation. Clinical correlation recommended.     12/01/2021 Tumor Marker   Patient's tumor was tested for the following markers: CA-125. Results of the tumor marker test revealed 491.   12/16/2021 - 12/16/2021 Chemotherapy   Patient is on Treatment Plan : UTERINE Carboplatin AUC 6 + Paclitaxel q21d     12/22/2021 Surgery   Robotic-assisted laparoscopic total hysterectomy with bilateral salpingo-oophorectomy, tumor debulking including high common iliac lymph node excision, aborted attempt of bilateral external iliac enlarged lymph nodes, mini-lap for specimen delivery, cystoscopy    12/22/2021 Pathology Results   FINAL MICROSCOPIC DIAGNOSIS:  A. LEFT CERVICAL MARGIN,  EXCISION: - Positive for carcinoma  B. HIGH  LEFT COMMON ILIAC LYMPH NODE, EXCISION: - Metastatic carcinosarcoma involving the lymph node  C. 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, including undifferentiated sarcoma with rhabdoid features and high-grade serous carcinoma, see comment - Tumor invades for a depth of 1 mm where myometrial thickness is 5 mm - Benign bilateral fallopian tubes and ovaries - See oncology table - See comment  ONCOLOGY TABLE:  UTERUS, CARCINOMA OR CARCINOSARCOMA: Resection  Procedure: Total hysterectomy and bilateral salpingo-oophorectomy Histologic Type: Carcinosarcoma (mixed malignant Mullerian tumor) Histologic Grade: High-grade Myometrial Invasion:      Depth of Myometrial Invasion (mm): 1 mm      Myometrial Thickness (mm): 5 mm      Percentage of Myometrial Invasion: 20% Uterine Serosa Involvement: Not identified Cervical stromal Involvement: Present Extent of involvement of other tissue/organs: Not identified Peritoneal/Ascitic Fluid: Not applicable Lymphovascular Invasion: Present Regional Lymph Nodes:      Pelvic Lymph Nodes Examined:                                  0 Sentinel                                  1 Non-sentinel                                  1 Total      Pelvic Lymph Nodes with Metastasis: 1                          Macrometastasis: (>2.0 mm): 1                          Micrometastasis: (>0.2 mm and < 2.0 mm): 0                          Isolated Tumor Cells (<0.2 mm): 0                          Laterality of Lymph Node with Tumor: Left                          Extracapsular Extension: Not identified      Para-aortic Lymph Nodes Examined:                                   0 Sentinel                                   0 non-sentinel                                   0 total Distant Metastasis:      Distant Site(s) Involved: Not  applicable Pathologic Stage Classification (pTNM, AJCC 8th Edition): pT1a, pN1 Ancillary Studies: MMR / MSI testing will be ordered Representative Tumor Block: C1 Comment(s): None  ([id number])  01/13/2022 Procedure   Placement of single lumen port a cath via right internal jugular vein. The catheter tip lies at the cavo-atrial junction. A power injectable port a cath was placed and is ready for immediate use.     01/21/2022 -  Chemotherapy   Patient is on Treatment Plan : UTERINE SEROUS CARCINOMA Carboplatin + Paclitaxel + Trastuzumab q21d x 6 Cycles / Trastuzumab q21d       Interval History: The patient reports doing well since procedure yesterday.  She has some pain around the incision, but this does not limit her ability to do daily activities.  Continues to endorse normal bowel bladder function.  Denies any vaginal bleeding.  Denies any fevers or chills.  Tolerating p.o. without nausea or emesis.  Past Medical/Surgical History: Past Medical History:  Diagnosis Date   Adjustment disorder with anxious mood 12/05/2018   Anemia    Anxiety    Arthralgia 06/29/2015   Arthritis    Breast cancer ([place]) 2010   Colon polyps    ?   Complication of anesthesia    DCIS (ductal carcinoma in situ) of breast 2010   Depression    Diverticulosis    Full dentures    Hyperlipidemia    Joint pain    Leukocytosis 07/11/2017   PONV (postoperative nausea and vomiting)    Woke up during surgery   Skin cancer    SCC   Wears glasses     Past Surgical History:  Procedure Laterality Date   BREAST BIOPSY Right 05/11/2012   BREAST BIOPSY Right 04/24/2012   BREAST LUMPECTOMY Right 12/2008   cancer-tamoxifen   BREAST RECONSTRUCTION WITH PLACEMENT OF TISSUE EXPANDER AND FLEX HD (ACELLULAR HYDRATED DERMIS) Right 06/20/2012   Procedure: BREAST RECONSTRUCTION WITH PLACEMENT OF TISSUE EXPANDER AND FLEX HD (ACELLULAR HYDRATED DERMIS);  Surgeon: Theodoro Kos, DO;  Location: [place] [facility];  Service: Plastics;  Laterality: Right;   BREAST REDUCTION WITH MASTOPEXY Left 10/11/2012   Procedure: LEFT BREAST REDUCTION WITH MASTOPEXY;  Surgeon: Theodoro Kos, DO;  Location: [facility];  Service: Plastics;  Laterality: Left;   cataract sugery Bilateral 03/2019   COLONOSCOPY  08/28/2013   [facility]   DILATION AND CURETTAGE OF UTERUS     heavy bleeding between two pregnancies   IR IMAGING GUIDED PORT INSERTION  01/12/2022   LAPAROTOMY N/A 01/12/2022   Procedure: incision drainage of abdominal incision wound;  Surgeon: Lahoma Crocker, MD;  Location: [facility];  Service: Gynecology;  Laterality: N/A;   LIPOSUCTION WITH LIPOFILLING Left 10/11/2012   Procedure: LIPOSUCTION WITH LIPOFILLING;  Surgeon: Theodoro Kos, DO;  Location: [facility];  Service: Plastics;  Laterality: Left;   LYMPH NODE DISSECTION N/A 12/22/2021   Procedure: LYMPH NODE DISSECTION;  Surgeon: Lafonda Mosses, MD;  Location: [facility];  Service: Gynecology;  Laterality: N/A;   MASTECTOMY Right 06/20/2012   MASTECTOMY W/ SENTINEL NODE BIOPSY Right 06/20/2012   Procedure: right skin sparing MASTECTOMY WITH SENTINEL LYMPH NODE BIOPSY;  Surgeon: Stark Klein, MD;  Location: [place];  Service: General;  Laterality: Right;   PARATHYROIDECTOMY Left 11/07/2017   Procedure: NECK EXPLORATION WITH LEFT THYROID LOBECTOMY;  Surgeon: [name], MD;  Location: [facility];  Service: General;  Laterality: Left;   POLYPECTOMY     REDUCTION MAMMAPLASTY Left 10/2012   REMOVAL OF TISSUE EXPANDER AND PLACEMENT OF IMPLANT Right 10/11/2012   Procedure: REMOVAL OF TISSUE EXPANDER AND PLACEMENT OF SILICONE IMPLANT RIGHT;  Surgeon: Lyndee Leo  Sanger, DO;  Location: [facility];  Service: Clinical cytogeneticist;  Laterality: Right;    Family History  Problem Relation Age of Onset   Alcohol abuse Mother    Colon polyps Mother    Mental illness Mother    Alcohol abuse Father        suicide    Mental illness Father    Cancer Sister        gyn cancer   Heart disease Paternal Grandfather    Colon cancer Neg Hx    Esophageal cancer Neg Hx    Rectal cancer Neg Hx    Stomach cancer Neg Hx    Breast cancer Neg Hx    Ovarian cancer Neg Hx    Endometrial cancer Neg Hx    Pancreatic cancer Neg Hx    Prostate cancer Neg Hx     Social History   Socioeconomic History   Marital status: Married    Spouse name: Not on file   Number of children: 2   Years of education: Not on file   Highest education level: Not on file  Occupational History   Occupation: retired LPN  Tobacco Use   Smoking status: Former    Types: Cigarettes    Quit date: 06/14/1981    Years since quitting: 40.6   Smokeless tobacco: Never  Vaping Use   Vaping Use: Never used  Substance and Sexual Activity   Alcohol use: No   Drug use: No   Sexual activity: Not Currently    Birth control/protection: Post-menopausal  Other Topics Concern   Not on file  Social History Narrative   Recently moved from [place] to be closer to her two daughters and grandchildren.   Retired Corporate treasurer.   DNR- forms singed on 05/07/2012 and returned to pt.   Social Determinants of Health   Financial Resource Strain: Low Risk  (02/02/2021)   Overall Financial Resource Strain (CARDIA)    Difficulty of Paying Living Expenses: Not hard at all  Food Insecurity: No Food Insecurity (11/30/2021)   Hunger Vital Sign    Worried About Running Out of Food in the Last Year: Never true    Ran Out of Food in the Last Year: Never true  Transportation Needs: No Transportation Needs (11/30/2021)   PRAPARE - Hydrologist (Medical): No    Lack of Transportation (Non-Medical): No  Physical Activity: Insufficiently Active (02/02/2021)   Exercise Vital Sign    Days of Exercise per Week: 3 days    Minutes of Exercise per Session: 30 min  Stress: No Stress Concern Present (02/02/2021)   [place]    Feeling of Stress : Not at all  Social Connections: Moderately Integrated (02/02/2021)   Social Connection and Isolation Panel [NHANES]    Frequency of Communication with Friends and Family: Twice a week    Frequency of Social Gatherings with Friends and Family: Twice a week    Attends Religious Services: Never    Marine scientist or Organizations: Yes    Attends Music therapist: More than 4 times per year    Marital Status: Married    Current Medications:  Current Outpatient Medications:    apixaban (ELIQUIS) 2.5 MG TABS tablet, Take 1 tablet (2.5 mg total) by mouth 2 (two) times daily. For AFTER surgery, plan to start taking 12/24/21 in the am (Patient not taking: Reported on 01/07/2022), Disp: 28 tablet, Rfl: 0  Cholecalciferol (VITAMIN D) 2000 UNITS CAPS, Take 2,000 Units by mouth daily. Take one by mouth daily (Patient not taking: Reported on 01/05/2022), Disp: , Rfl:    clindamycin (CLEOCIN) 300 MG capsule, Take 1 capsule (300 mg total) by mouth 2 (two) times daily for 7 days., Disp: 14 capsule, Rfl: 0   escitalopram (LEXAPRO) 10 MG tablet, Take 1 tablet (10 mg total) by mouth daily., Disp: 90 tablet, Rfl: 3   famotidine (PEPCID) 20 MG tablet, Take 20 mg by mouth 2 (two) times daily as needed for heartburn or indigestion., Disp: , Rfl:    magnesium 30 MG tablet, Take 30 mg by mouth daily. (Patient not taking: Reported on 01/05/2022), Disp: , Rfl:    OVER THE COUNTER MEDICATION, Apply 1 application  topically as needed (knee pain). CBD Cream- (Patient not taking: Reported on 01/07/2022), Disp: , Rfl:    pantoprazole (PROTONIX) 40 MG tablet, Take 1 tablet (40 mg total) by mouth 2 (two) times daily., Disp: 120 tablet, Rfl: 0   senna-docusate (SENOKOT-S) 8.6-50 MG tablet, Take 2 tablets by mouth at bedtime. For AFTER surgery, do not take if having diarrhea (Patient not taking: Reported on 01/05/2022), Disp: 30 tablet, Rfl: 0    traMADol (ULTRAM) 50 MG tablet, Take 1 tablet (50 mg total) by mouth every 12 (twelve) hours as needed for up to 3 days., Disp: 10 tablet, Rfl: 0   vitamin B-12 (CYANOCOBALAMIN) 1000 MCG tablet, Take 1,000 mcg by mouth daily at 2 PM. (Patient not taking: Reported on 01/05/2022), Disp: , Rfl:   Review of Systems: Denies appetite changes, fevers, chills, fatigue, unexplained weight changes. Denies hearing loss, neck lumps or masses, mouth sores, ringing in ears or voice changes. Denies cough or wheezing.  Denies shortness of breath. Denies chest pain or palpitations. Denies leg swelling. Denies abdominal distention, pain, blood in stools, constipation, diarrhea, nausea, vomiting, or early satiety. Denies pain with intercourse, dysuria, frequency, hematuria or incontinence. Denies hot flashes, pelvic pain, vaginal bleeding or vaginal discharge.   Denies joint pain, back pain or muscle pain/cramps. Denies itching, rash, or wounds. Denies dizziness, headaches, numbness or seizures. Denies swollen lymph nodes or glands, denies easy bruising or bleeding. Denies anxiety, depression, confusion, or decreased concentration.  Physical Exam: BP 134/67 (BP Location: Left Arm, Patient Position: Sitting)   Pulse 86   Temp 98.1 F (36.7 C) (Oral)   Resp 20   Wt 146 lb (66.2 kg)   BMI 29.49 kg/m  General: Alert, oriented, no acute distress. HEENT: Normocephalic, atraumatic, sclera anicteric. Chest: Unlabored breathing on room air Abdomen: soft, nontender.  Normoactive bowel sounds.  No masses or hepatosplenomegaly appreciated.  Well-healed robotic incisions.  Packing taken out of mini lap incision.  There is mild induration and some erythema around the incision itself, similar to my exam yesterday.  Marker used to outline the erythema.  The bed of the incision itself is overall healthy appearing.  Fascia palpated and intact.  Wound irrigated and repacked with wet-to-dry dressing using Kerlix.  ABD pad and  tape used to place over the incision.  Laboratory & Radiologic Studies:    Latest Ref Rng & Units 01/12/2022   12:20 PM 01/05/2022    2:25 PM 12/31/2021    9:55 AM  CBC  WBC 4.0 - 10.5 K/uL 7.9  9.1  11.7   Hemoglobin 12.0 - 15.0 g/dL 9.5  9.8  7.4   Hematocrit 36.0 - 46.0 % 30.5  29.5  22.8   Platelets 150 -  400 K/uL 315  353.0  277     Assessment & Plan: Kayla Mcgrath is a 77 y.o. woman with Stage IIIC2 carcinosarcoma s/p surgery on 12/13 who presented for port placement on 1/3 and endorsed drainage from her incision found to have a superficial wound incision of her mini lap.  She underwent incision and drainage of the mini lap site on 1/3.  Presents today for wound examination and repacking.  Overall the wound looks healthy.  Patient started antibiotics yesterday.  Denies any systemic signs or symptoms of infection.  White blood cell count was normal yesterday.  Culture is still pending.  I will call her if we need to change antibiotic therapy.  Patient instructed on manner in which wet-to-dry dressing should be performed.  I am hopeful that we may be able to get a wound VAC for the incision next week.  Will tentatively schedule her an appointment to come back for assessment of the wound and repacking on Monday.  Either her daughter or her husband will help her with daily dressing changes over the weekend.  22 minutes of total time was spent for this patient encounter, including preparation, face-to-face counseling with the patient and coordination of care, and documentation of the encounter.  Jeral Pinch, MD  Division of Gynecologic Oncology  Department of Obstetrics and Gynecology  [facility]

## 2022-01-15 DIAGNOSIS — E785 Hyperlipidemia, unspecified: Secondary | ICD-10-CM | POA: Diagnosis not present

## 2022-01-15 DIAGNOSIS — F419 Anxiety disorder, unspecified: Secondary | ICD-10-CM | POA: Diagnosis not present

## 2022-01-15 DIAGNOSIS — C549 Malignant neoplasm of corpus uteri, unspecified: Secondary | ICD-10-CM | POA: Diagnosis not present

## 2022-01-15 DIAGNOSIS — F4322 Adjustment disorder with anxiety: Secondary | ICD-10-CM | POA: Diagnosis not present

## 2022-01-15 DIAGNOSIS — D630 Anemia in neoplastic disease: Secondary | ICD-10-CM | POA: Diagnosis not present

## 2022-01-15 DIAGNOSIS — T8141XA Infection following a procedure, superficial incisional surgical site, initial encounter: Secondary | ICD-10-CM | POA: Diagnosis not present

## 2022-01-15 DIAGNOSIS — K5790 Diverticulosis of intestine, part unspecified, without perforation or abscess without bleeding: Secondary | ICD-10-CM | POA: Diagnosis not present

## 2022-01-15 DIAGNOSIS — F32A Depression, unspecified: Secondary | ICD-10-CM | POA: Diagnosis not present

## 2022-01-15 DIAGNOSIS — M1990 Unspecified osteoarthritis, unspecified site: Secondary | ICD-10-CM | POA: Diagnosis not present

## 2022-01-16 LAB — AEROBIC/ANAEROBIC CULTURE W GRAM STAIN (SURGICAL/DEEP WOUND)

## 2022-01-17 ENCOUNTER — Inpatient Hospital Stay (HOSPITAL_BASED_OUTPATIENT_CLINIC_OR_DEPARTMENT_OTHER): Payer: Medicare HMO | Admitting: Gynecologic Oncology

## 2022-01-17 ENCOUNTER — Other Ambulatory Visit: Payer: Self-pay

## 2022-01-17 ENCOUNTER — Other Ambulatory Visit: Payer: Self-pay | Admitting: Oncology

## 2022-01-17 VITALS — BP 135/73 | HR 93 | Temp 98.9°F | Resp 20 | Ht 59.5 in | Wt 144.6 lb

## 2022-01-17 DIAGNOSIS — Z90722 Acquired absence of ovaries, bilateral: Secondary | ICD-10-CM

## 2022-01-17 DIAGNOSIS — T8149XA Infection following a procedure, other surgical site, initial encounter: Secondary | ICD-10-CM

## 2022-01-17 DIAGNOSIS — Z90710 Acquired absence of both cervix and uterus: Secondary | ICD-10-CM

## 2022-01-17 DIAGNOSIS — T8131XD Disruption of external operation (surgical) wound, not elsewhere classified, subsequent encounter: Secondary | ICD-10-CM

## 2022-01-17 MED ORDER — CEPHALEXIN 500 MG PO CAPS: 500.0000 mg | ORAL_CAPSULE | Freq: Two times a day (BID) | ORAL | 0 refills | Status: DC

## 2022-01-17 NOTE — Progress Notes (Signed)
Gynecologic Oncology Multi-Disciplinary Disposition Conference Note  Date of the Conference: 01/17/2022  Patient Name: Kayla Mcgrath [facility]  Referring Provider: Dr. Elly Modena Primary GYN Oncologist: Dr. Berline Lopes   Stage/Disposition:  Stage IIIC2i uterine carcinosarcoma. Disposition is to adjuvant chemotherapy followed by repeat imaging and consideration for pelvic radiation.   This Multidisciplinary conference took place involving physicians from [place], Medical Oncology, Radiation Oncology, Pathology, Radiology along with the Gynecologic Oncology Nurse Practitioner and Gynecologic Oncology Nurse Navigator.  Comprehensive assessment of the patient's malignancy, staging, need for surgery, chemotherapy, radiation therapy, and need for further testing were reviewed. Supportive measures, both inpatient and following discharge were also discussed. The recommended plan of care is documented. Greater than 35 minutes were spent correlating and coordinating this patient's care.

## 2022-01-17 NOTE — Patient Instructions (Signed)
Based on the incision culture results, plan to stop taking clindamycin and begin taking keflex twice a day for 5 days.   We will reach out to [facility] about having the negative pressure wound dressing applied. This can speed up the healing process. Usually these dressings are two to three times a week.  Please call the office for any questions, needs, or concerns.

## 2022-01-17 NOTE — Progress Notes (Signed)
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 Pelvic sonogram is otherwise unremarkable.    11/11/2021 Pathology Results   A. CERVIX, MASS, EXCISION:  - HIGH-GRADE UNDIFFERENTIATED MALIGNANCY WITH EXTENSIVE NECROSIS (SEE COMMENT).   COMMENT:  The tumor consists of mostly high-grade sarcomatoid morphology with focal epithelioid morphology.  Immunohistochemical stains are performed on block A4.  The tumor cells are positive for desmin and p16 while negative for CD45, CK7, CK20, chromogranin, synaptophysin, Melan-A, p40, AE1/AE3, SMA, p63, cyclin D1, S100 and smooth muscle myosin.  Ki-67 proliferation index is high.  CD10 staining shows focal nonspecific staining.  The differential diagnosis includes a high-grade sarcoma and a partially sampled carcinosarcoma.    11/25/2021 Imaging   MRI pelvis Marked distention of the entire endometrial cavity by heterogeneously enhancing soft tissue density, which extends through the endocervical canal into the lower vagina. This is highly suspicious for endometrial carcinoma.   Diffuse myometrial thinning due to marked distention of endometrial cavity limits evaluation; deep myometrial invasion cannot be excluded in the uterine fundus.   No evidence of extra-uterine extension of tumor.   Lymphadenopathy in lower abdominal retroperitoneum, bilateral iliac chains, and sigmoid mesocolon, consistent with metastatic disease.   Sigmoid diverticulosis. No radiographic evidence of diverticulitis.   11/26/2021 Initial Diagnosis   Uterine cancer ([place])   11/26/2021 Cancer Staging   Staging form: Corpus Uteri - Carcinoma and  Carcinosarcoma, AJCC 8th Edition - Clinical stage from 11/26/2021: FIGO Stage IIIC2 (cT3, cN2, cM0) - Signed by Heath Lark, MD on 11/26/2021 Stage prefix: Initial diagnosis Histologic grade (G): G3 Histologic grading system: 3 grade system   11/30/2021 Pathology Results   FINAL MICROSCOPIC DIAGNOSIS:  A. UTERINE CONTENTS, BIOPSY: - HIGH-GRADE UNDIFFERENTIATED MALIGNANCY WITH EXTENSIVE NECROSIS (SEE COMMENT).  COMMENT: The patient's history of a high-grade undifferentiated malignancy with extensive necrosis of the cervix is noted.  The morphological features of the current tumor cells are similar to the tumor cells seen in the previous cervical mass excision.  Immunohistochemical staining for desmin and cytokeratin AE1/AE3 is performed on block A1.  The tumor cells are diffusely positive for desmin.   AE1/AE3 stain is focally positive, however this is scant and of unclear clinical significance. Given the small quantity of tissue in the current biopsy, a definitive distinction between a high-grade sarcoma and carcinosarcoma cannot be made as this biopsy may not be representative of the entire tumor. If clinically indicated, this case as well as the previous cervical mass excision ([id number]) may be sent out for expert consultation. Clinical correlation recommended.     12/01/2021 Tumor Marker   Patient's tumor was tested for the following markers: CA-125. Results of the tumor marker test revealed 491.   12/22/2021 Surgery   Robotic-assisted laparoscopic total hysterectomy with bilateral salpingo-oophorectomy, tumor debulking including high common iliac lymph node excision, aborted attempt of bilateral external iliac enlarged lymph nodes, mini-lap for specimen delivery, cystoscopy    12/22/2021 Pathology Results   FINAL MICROSCOPIC DIAGNOSIS:  A. LEFT CERVICAL MARGIN, EXCISION: - Positive for carcinoma  B. HIGH LEFT COMMON ILIAC LYMPH NODE, EXCISION: - Metastatic carcinosarcoma  involving the lymph node  C. UTERUS, CERVIX, BILATERAL FALLOPIAN TUBES AND  OVARIES, RESECTION: - Carcinosarcoma (Mixed Malignant Mullerian Tumor), 13.2 cm, including undifferentiated sarcoma with rhabdoid features and high-grade serous carcinoma, see comment - Tumor invades for a depth of 1 mm where myometrial thickness is 5 mm - Benign bilateral fallopian tubes and ovaries - See oncology table - See comment  ONCOLOGY TABLE:  UTERUS, CARCINOMA OR CARCINOSARCOMA: Resection  Procedure: Total hysterectomy and bilateral salpingo-oophorectomy Histologic Type: Carcinosarcoma (mixed malignant Mullerian tumor) Histologic Grade: High-grade Myometrial Invasion:      Depth of Myometrial Invasion (mm): 1 mm      Myometrial Thickness (mm): 5 mm      Percentage of Myometrial Invasion: 20% Uterine Serosa Involvement: Not identified Cervical stromal Involvement: Present Extent of involvement of other tissue/organs: Not identified Peritoneal/Ascitic Fluid: Not applicable Lymphovascular Invasion: Present Regional Lymph Nodes:      Pelvic Lymph Nodes Examined:                                  0 Sentinel                                  1 Non-sentinel                                  1 Total      Pelvic Lymph Nodes with Metastasis: 1                          Macrometastasis: (>2.0 mm): 1                          Micrometastasis: (>0.2 mm and < 2.0 mm): 0                          Isolated Tumor Cells (<0.2 mm): 0                          Laterality of Lymph Node with Tumor: Left                          Extracapsular Extension: Not identified      Para-aortic Lymph Nodes Examined:                                   0 Sentinel                                   0 non-sentinel                                   0 total Distant Metastasis:      Distant Site(s) Involved: Not applicable Pathologic Stage Classification (pTNM, AJCC 8th Edition): pT1a, pN1 Ancillary Studies: MMR / MSI testing will be  ordered Representative Tumor Block: C1 Comment(s): None  ([id number])       01/13/2022 Procedure   Placement of single lumen port a cath via right internal jugular vein. The catheter tip lies at  the cavo-atrial junction. A power injectable port a cath was placed and is ready for immediate use.     01/21/2022 -  Chemotherapy   Patient is on Treatment Plan : UTERINE SEROUS CARCINOMA Carboplatin + Paclitaxel + Trastuzumab q21d x 6 Cycles / Trastuzumab q21d       Interval History: The patient reports doing well since last office visit.  She denies fever or chills. She has minimal pain around the incision and is able to do daily activities.  Continues to endorse normal bowel bladder function.  Denies any vaginal bleeding. Tolerating p.o. without nausea or emesis. Had home health RN come to the home over the weekend for dressing change. She reports feeling like her right shoulder has been pulled after having port placed. No swelling in the RUE. No erythema or drainage from port site.  Past Medical/Surgical History: Past Medical History:  Diagnosis Date   Adjustment disorder with anxious mood 12/05/2018   Anemia    Anxiety    Arthralgia 06/29/2015   Arthritis    Breast cancer ([place]) 2010   Colon polyps    ?   Complication of anesthesia    DCIS (ductal carcinoma in situ) of breast 2010   Depression    Diverticulosis    Full dentures    Hyperlipidemia    Joint pain    Leukocytosis 07/11/2017   PONV (postoperative nausea and vomiting)    Woke up during surgery   Skin cancer    SCC   Wears glasses     Past Surgical History:  Procedure Laterality Date   BREAST BIOPSY Right 05/11/2012   BREAST BIOPSY Right 04/24/2012   BREAST LUMPECTOMY Right 12/2008   cancer-tamoxifen   BREAST RECONSTRUCTION WITH PLACEMENT OF TISSUE EXPANDER AND FLEX HD (ACELLULAR HYDRATED DERMIS) Right 06/20/2012   Procedure: BREAST RECONSTRUCTION WITH PLACEMENT OF TISSUE EXPANDER AND FLEX HD (ACELLULAR HYDRATED  DERMIS);  Surgeon: Theodoro Kos, DO;  Location: [facility];  Service: Plastics;  Laterality: Right;   BREAST REDUCTION WITH MASTOPEXY Left 10/11/2012   Procedure: LEFT BREAST REDUCTION WITH MASTOPEXY;  Surgeon: Theodoro Kos, DO;  Location: [facility];  Service: Plastics;  Laterality: Left;   cataract sugery Bilateral 03/2019   COLONOSCOPY  08/28/2013   [facility]   DILATION AND CURETTAGE OF UTERUS     heavy bleeding between two pregnancies   IR IMAGING GUIDED PORT INSERTION  01/12/2022   LAPAROTOMY N/A 01/12/2022   Procedure: incision drainage of abdominal incision wound;  Surgeon: Lahoma Crocker, MD;  Location: [facility];  Service: Gynecology;  Laterality: N/A;   LIPOSUCTION WITH LIPOFILLING Left 10/11/2012   Procedure: LIPOSUCTION WITH LIPOFILLING;  Surgeon: Theodoro Kos, DO;  Location: [facility];  Service: Plastics;  Laterality: Left;   LYMPH NODE DISSECTION N/A 12/22/2021   Procedure: LYMPH NODE DISSECTION;  Surgeon: Lafonda Mosses, MD;  Location: [facility];  Service: Gynecology;  Laterality: N/A;   MASTECTOMY Right 06/20/2012   MASTECTOMY W/ SENTINEL NODE BIOPSY Right 06/20/2012   Procedure: right skin sparing MASTECTOMY WITH SENTINEL LYMPH NODE BIOPSY;  Surgeon: Stark Klein, MD;  Location: [facility];  Service: General;  Laterality: Right;   PARATHYROIDECTOMY Left 11/07/2017   Procedure: NECK EXPLORATION WITH LEFT THYROID LOBECTOMY;  Surgeon: [name], MD;  Location: [facility];  Service: General;  Laterality: Left;   POLYPECTOMY     REDUCTION MAMMAPLASTY Left 10/2012   REMOVAL OF TISSUE EXPANDER AND PLACEMENT OF IMPLANT Right 10/11/2012  Procedure: REMOVAL OF TISSUE EXPANDER AND PLACEMENT OF SILICONE IMPLANT RIGHT;  Surgeon: Theodoro Kos, DO;  Location: [facility];  Service: Plastics;  Laterality: Right;    Family History  Problem Relation Age of Onset   Alcohol abuse Mother    Colon polyps Mother     Mental illness Mother    Alcohol abuse Father        suicide   Mental illness Father    Cancer Sister        gyn cancer   Heart disease Paternal Grandfather    Colon cancer Neg Hx    Esophageal cancer Neg Hx    Rectal cancer Neg Hx    Stomach cancer Neg Hx    Breast cancer Neg Hx    Ovarian cancer Neg Hx    Endometrial cancer Neg Hx    Pancreatic cancer Neg Hx    Prostate cancer Neg Hx     Social History   Socioeconomic History   Marital status: Married    Spouse name: Not on file   Number of children: 2   Years of education: Not on file   Highest education level: Not on file  Occupational History   Occupation: retired LPN  Tobacco Use   Smoking status: Former    Types: Cigarettes    Quit date: 06/14/1981    Years since quitting: 40.6   Smokeless tobacco: Never  Vaping Use   Vaping Use: Never used  Substance and Sexual Activity   Alcohol use: No   Drug use: No   Sexual activity: Not Currently    Birth control/protection: Post-menopausal  Other Topics Concern   Not on file  Social History Narrative   Recently moved from [place] to be closer to her two daughters and grandchildren.   Retired Corporate treasurer.   DNR- forms singed on 05/07/2012 and returned to pt.   Social Determinants of Health   Financial Resource Strain: Low Risk  (02/02/2021)   Overall Financial Resource Strain (CARDIA)    Difficulty of Paying Living Expenses: Not hard at all  Food Insecurity: No Food Insecurity (11/30/2021)   Hunger Vital Sign    Worried About Running Out of Food in the Last Year: Never true    Ran Out of Food in the Last Year: Never true  Transportation Needs: No Transportation Needs (11/30/2021)   PRAPARE - Hydrologist (Medical): No    Lack of Transportation (Non-Medical): No  Physical Activity: Insufficiently Active (02/02/2021)   Exercise Vital Sign    Days of Exercise per Week: 3 days    Minutes of Exercise per Session: 30 min  Stress: No Stress Concern  Present (02/02/2021)   [place]    Feeling of Stress : Not at all  Social Connections: Moderately Integrated (02/02/2021)   Social Connection and Isolation Panel [NHANES]    Frequency of Communication with Friends and Family: Twice a week    Frequency of Social Gatherings with Friends and Family: Twice a week    Attends Religious Services: Never    Marine scientist or Organizations: Yes    Attends Music therapist: More than 4 times per year    Marital Status: Married    Current Medications:  Current Outpatient Medications:    cephALEXin (KEFLEX) 500 MG capsule, Take 1 capsule (500 mg total) by mouth 2 (two) times daily., Disp: 10 capsule, Rfl: 0   clindamycin (CLEOCIN)  300 MG capsule, Take 1 capsule (300 mg total) by mouth 2 (two) times daily for 7 days., Disp: 14 capsule, Rfl: 0   escitalopram (LEXAPRO) 10 MG tablet, Take 1 tablet (10 mg total) by mouth daily., Disp: 90 tablet, Rfl: 3   famotidine (PEPCID) 20 MG tablet, Take 20 mg by mouth 2 (two) times daily as needed for heartburn or indigestion., Disp: , Rfl:    pantoprazole (PROTONIX) 40 MG tablet, Take 1 tablet (40 mg total) by mouth 2 (two) times daily., Disp: 120 tablet, Rfl: 0  Review of Systems: See interval. Additional ROS negative.  Physical Exam: BP 135/73 (BP Location: Left Arm, Patient Position: Sitting)   Pulse 93   Temp 98.9 F (37.2 C) (Oral)   Resp 20   Ht 4' 11.5" (1.511 m)   Wt 144 lb 9.6 oz (65.6 kg)   SpO2 100%   BMI 28.72 kg/m  General: Alert, oriented, no acute distress. HEENT: Normocephalic, atraumatic, sclera anicteric. Chest: Unlabored breathing on room air Abdomen: soft, nontender.  Well-healed robotic incisions.  Normal saline poured over abdominal packing. Packing taken out of mini lap incision without difficulty. Erythema previously marked has receded. Wound bed appears healthy with minimal drainage. Wound  irrigated and repacked with wet-to-dry dressing using Kerlix. Wound measurements include 5 cm in length, 2.5 in width, 4 cm in depth, 1 cm tunneling area in the anterior abdomen. ABD pad and tape used to place over the incision.  Laboratory & Radiologic Studies:    Latest Ref Rng & Units 01/12/2022   12:20 PM 01/05/2022    2:25 PM 12/31/2021    9:55 AM  CBC  WBC 4.0 - 10.5 K/uL 7.9  9.1  11.7   Hemoglobin 12.0 - 15.0 g/dL 9.5  9.8  7.4   Hematocrit 36.0 - 46.0 % 30.5  29.5  22.8   Platelets 150 - 400 K/uL 315  353.0  277     Assessment & Plan: Kayla Mcgrath is a 77 y.o. woman with Stage IIIC2 carcinosarcoma s/p surgery on 12/13 who presented for port placement on 1/3 and endorsed drainage from her incision found to have a superficial wound infection of her mini lap.  She underwent incision and drainage of the mini lap site on 1/3.  Presents today for wound examination and repacking.  Overall the wound looks healthy.  Patient started antibiotics on 01/12/2022.  Denies any systemic signs or symptoms of infection. Wound culture results discussed along with Dr. Charisse March recommendation to stop clindamycin and begin taking keflex for 5 days. Given her kidney function, new script sent in for keflex 500 mg BID for 5 days. Recommendation for application of negative pressure wound therapy to promote healing. Patient will be needing to start adjuvant therapy as soon as possible.  22 minutes of total time was spent for this patient encounter, including preparation, face-to-face counseling with the patient and coordination of care, and documentation of the encounter.  Jeral Pinch, MD  Division of Gynecologic Oncology  Department of Obstetrics and Gynecology  [facility]

## 2022-01-18 ENCOUNTER — Encounter: Payer: Self-pay | Admitting: Gynecologic Oncology

## 2022-01-18 ENCOUNTER — Telehealth: Payer: Self-pay | Admitting: Oncology

## 2022-01-18 ENCOUNTER — Inpatient Hospital Stay: Payer: Medicare HMO

## 2022-01-18 ENCOUNTER — Telehealth: Payer: Self-pay | Admitting: *Deleted

## 2022-01-18 ENCOUNTER — Inpatient Hospital Stay: Payer: Medicare HMO | Admitting: Genetic Counselor

## 2022-01-18 NOTE — Telephone Encounter (Signed)
Per patient requested, moved chemo class from 1/11 to 1/16.   Patient stated that she received the wound vac box last night and is waiting for home health to come today

## 2022-01-18 NOTE — Telephone Encounter (Signed)
Kayla Mcgrath and asked if she has heard from the home health nurse about applying the wound vac.  She said she has left messages for the nurse but has not heard back yet.  Called [facility] and left a message on the nurse line requesting a return call.

## 2022-01-19 ENCOUNTER — Telehealth: Payer: Self-pay | Admitting: Oncology

## 2022-01-19 ENCOUNTER — Ambulatory Visit (HOSPITAL_COMMUNITY)
Admission: RE | Admit: 2022-01-19 | Discharge: 2022-01-19 | Disposition: A | Discharge status: Home / Self Care | Payer: Medicare HMO | Source: Ambulatory Visit | Attending: Hematology and Oncology | Admitting: Hematology and Oncology

## 2022-01-19 DIAGNOSIS — E785 Hyperlipidemia, unspecified: Secondary | ICD-10-CM | POA: Diagnosis not present

## 2022-01-19 DIAGNOSIS — C549 Malignant neoplasm of corpus uteri, unspecified: Secondary | ICD-10-CM | POA: Diagnosis not present

## 2022-01-19 DIAGNOSIS — Z0189 Encounter for other specified special examinations: Secondary | ICD-10-CM | POA: Diagnosis not present

## 2022-01-19 DIAGNOSIS — Z09 Encounter for follow-up examination after completed treatment for conditions other than malignant neoplasm: Secondary | ICD-10-CM | POA: Insufficient documentation

## 2022-01-19 DIAGNOSIS — F32A Depression, unspecified: Secondary | ICD-10-CM | POA: Diagnosis not present

## 2022-01-19 DIAGNOSIS — T8141XA Infection following a procedure, superficial incisional surgical site, initial encounter: Secondary | ICD-10-CM | POA: Diagnosis not present

## 2022-01-19 DIAGNOSIS — F4322 Adjustment disorder with anxiety: Secondary | ICD-10-CM | POA: Diagnosis not present

## 2022-01-19 DIAGNOSIS — I358 Other nonrheumatic aortic valve disorders: Secondary | ICD-10-CM | POA: Insufficient documentation

## 2022-01-19 DIAGNOSIS — M1990 Unspecified osteoarthritis, unspecified site: Secondary | ICD-10-CM | POA: Diagnosis not present

## 2022-01-19 DIAGNOSIS — D630 Anemia in neoplastic disease: Secondary | ICD-10-CM | POA: Diagnosis not present

## 2022-01-19 DIAGNOSIS — K5790 Diverticulosis of intestine, part unspecified, without perforation or abscess without bleeding: Secondary | ICD-10-CM | POA: Diagnosis not present

## 2022-01-19 DIAGNOSIS — F419 Anxiety disorder, unspecified: Secondary | ICD-10-CM | POA: Diagnosis not present

## 2022-01-19 DIAGNOSIS — I517 Cardiomegaly: Secondary | ICD-10-CM | POA: Insufficient documentation

## 2022-01-19 LAB — ECHOCARDIOGRAM COMPLETE
Area-P 1/2: 4.86 cm2
S' Lateral: 2.3 cm

## 2022-01-19 NOTE — Telephone Encounter (Signed)
Kayla Mcgrath with [facility] called back and said a nurse is scheduled to go out to apply the wound vac today.  Called Kayla Mcgrath and let her know.

## 2022-01-20 ENCOUNTER — Telehealth: Payer: Self-pay | Admitting: Oncology

## 2022-01-20 ENCOUNTER — Inpatient Hospital Stay (HOSPITAL_BASED_OUTPATIENT_CLINIC_OR_DEPARTMENT_OTHER): Payer: Medicare HMO | Admitting: Hematology and Oncology

## 2022-01-20 ENCOUNTER — Inpatient Hospital Stay (HOSPITAL_BASED_OUTPATIENT_CLINIC_OR_DEPARTMENT_OTHER): Payer: Medicare HMO | Admitting: Gynecologic Oncology

## 2022-01-20 ENCOUNTER — Encounter: Payer: Self-pay | Admitting: Gynecologic Oncology

## 2022-01-20 ENCOUNTER — Encounter: Payer: Self-pay | Admitting: Hematology and Oncology

## 2022-01-20 ENCOUNTER — Telehealth: Payer: Self-pay | Admitting: Surgery

## 2022-01-20 ENCOUNTER — Other Ambulatory Visit: Payer: Medicare HMO

## 2022-01-20 VITALS — BP 146/83 | HR 92 | Temp 98.2°F | Resp 16 | Ht 59.5 in | Wt 143.2 lb

## 2022-01-20 DIAGNOSIS — T148XXD Other injury of unspecified body region, subsequent encounter: Secondary | ICD-10-CM | POA: Insufficient documentation

## 2022-01-20 DIAGNOSIS — N179 Acute kidney failure, unspecified: Secondary | ICD-10-CM

## 2022-01-20 DIAGNOSIS — C775 Secondary and unspecified malignant neoplasm of intrapelvic lymph nodes: Secondary | ICD-10-CM

## 2022-01-20 DIAGNOSIS — Z90710 Acquired absence of both cervix and uterus: Secondary | ICD-10-CM

## 2022-01-20 DIAGNOSIS — T8149XA Infection following a procedure, other surgical site, initial encounter: Secondary | ICD-10-CM

## 2022-01-20 DIAGNOSIS — Z7189 Other specified counseling: Secondary | ICD-10-CM

## 2022-01-20 DIAGNOSIS — T8189XA Other complications of procedures, not elsewhere classified, initial encounter: Secondary | ICD-10-CM | POA: Diagnosis not present

## 2022-01-20 DIAGNOSIS — C549 Malignant neoplasm of corpus uteri, unspecified: Secondary | ICD-10-CM

## 2022-01-20 DIAGNOSIS — Z90722 Acquired absence of ovaries, bilateral: Secondary | ICD-10-CM

## 2022-01-20 MED ORDER — ONDANSETRON HCL 8 MG PO TABS: 8.0000 mg | ORAL_TABLET | Freq: Three times a day (TID) | ORAL | 1 refills | Status: AC | PRN

## 2022-01-20 MED ORDER — PROCHLORPERAZINE MALEATE 10 MG PO TABS: 10.0000 mg | ORAL_TABLET | Freq: Four times a day (QID) | ORAL | 1 refills | Status: DC | PRN

## 2022-01-20 MED ORDER — LIDOCAINE-PRILOCAINE 2.5-2.5 % EX CREA: TOPICAL_CREAM | CUTANEOUS | 3 refills | Status: DC

## 2022-01-20 MED ORDER — DEXAMETHASONE 4 MG PO TABS: ORAL_TABLET | ORAL | 6 refills | Status: DC

## 2022-01-20 NOTE — Assessment & Plan Note (Signed)
We will delay the start date of her treatment by 2 weeks I will reassess the morning before treatment

## 2022-01-20 NOTE — Telephone Encounter (Signed)
Left a message for [facility] regarding the Wound Vac.  Requested a return call.

## 2022-01-20 NOTE — Assessment & Plan Note (Signed)
Renal function is stable. Monitor closely 

## 2022-01-20 NOTE — Progress Notes (Signed)
[facility] OFFICE PROGRESS NOTE  Patient Care Team: Lafonda Mosses, MD as PCP - General (Gynecologic Oncology) Owens Loffler, MD as Consulting Physician [facility])  ASSESSMENT & PLAN:  Carcinosarcoma of body of uterus [facility]) Her postsurgical course was complicated by poor wound healing After much discussion with GYN surgeon, we are in agreement to start with chemotherapy approximately 2 weeks from now I reviewed echocardiogram report  We reviewed the NCCN guidelines The treatment is based on publication as follows: We discussed the role of chemotherapy. The intent is of curative intent.  Randomized Phase II Trial of Carboplatin-Paclitaxel Versus Carboplatin-Paclitaxel-Trastuzumab in Uterine Serous Carcinomas That Overexpress Human Epidermal Growth Factor Receptor 2/neu  Yvonne Kendall. Mickel Duhamel. Roque, Eric [place], Natalia [place], [place], [place], Iota K. Josiah Lobo, [address] Mickel Baas [place]. [place], Floor Backes, [place], Babak [place], Dirk [place], Sharmon Leyden, Alyssa [place], [address] Stefania [place], Masoud Alex, Babak [place], [place], Dan-Arin Silasi, Jarvis Morgan, and Heloise Beecham. [place]  Purpose Uterine serous carcinoma is a rare, aggressive variant of endometrial cancer. Trastuzumab is a humanized monoclonal antibody that targets human epidermal growth factor receptor 2 (HER2)/neu, a receptor overexpressed in 30%of uterine serous carcinoma. This multicenter, randomized phase II trial compared carboplatin-paclitaxel with and without trastuzumab in patients with advanced or recurrent uterine serous carcinoma who overexpress HER2/neu.  Methods Eligible patients had primary stage III or IV or recurrent HER2/neu-positive disease. Participants were randomly assigned to receive carboplatin-paclitaxel (control arm) for six cycles with or without intravenous trastuzumab (experimental arm) until  progression or unacceptable toxicity. The primary end point was progression-free survival, which was assessed for differences between treatment arms via one-sided log-rank tests.  Results From August 2011 to March 2017, 61 patients were randomly assigned. Forty progression-free survival-related events occurred among 58 evaluable participants. Among all patients, median progression-free survivalwas 8.13month (control) versus 12.672month(experimental; P = .005; hazard ratio [HR], 0.44; 90% CI, 0.26 to 0.76). Similarly, median progression-free survival was 9.3 (control) versus 17.9 (experimental)months among 41 patients with stage III or IV disease undergoing primary treatment (P = .013; HR, 0.40; 90% CI, 0.20 to 0.80) and 6.0 (control) versus 9.2 months (experimental), respectively, among 17 patients with recurrent disease (P = .003; HR, 0.14; 90% CI, 0.04 to 0.53). Toxicitywas not different between treatment arms, and no unexpected safety signals emerged.   Conclusion Addition of trastuzumab to carboplatin-paclitaxel was well tolerated and increased progression-free survival. These encouraging results deserve further investigation to determine their impact on overall survival in patients with advanced or recurrent uterine serous carcinoma who overexpress HER2/neu.  J Clin OnVVOHY [id number] 2018 by American Society of Clinical Oncology   We discussed some of the risks, benefits, side-effects of carboplatin, Taxol and Herceptin. Treatment is intravenous, every 3 weeks x 6 cycles  Some of the short term side-effects included, though not limited to, including weight loss, life threatening infections, risk of allergic reactions, need for transfusions of blood products, nausea, vomiting, change in bowel habits, congestive heart failure, loss of hair, admission to hospital for various reasons, and risks of death.   Long term side-effects are also discussed including risks of infertility, permanent damage  to nerve function, hearing loss, chronic fatigue, kidney damage with possibility needing hemodialysis, and rare secondary malignancy including bone marrow disorders.  The patient is aware that the response rates discussed earlier is not guaranteed.  After a long discussion, patient made an informed decision to proceed with the prescribed  plan of care.   Patient education material was dispensed. We discussed premedication with dexamethasone before chemotherapy. She does not need prophylactic GCSF support I will get her started on chemotherapy within the next 2 weeks She is scheduled for chemo education class next week    Acute renal failure (ARF) ([place]) Renal function is stable Monitor closely  Delayed wound healing We will delay the start date of her treatment by 2 weeks I will reassess the morning before treatment  Orders Placed This Encounter  Procedures   CMP ([place] only)    Standing Status:   Future    Standing Expiration Date:   01/21/2023   CBC with Differential ([place] Only)    Standing Status:   Future    Standing Expiration Date:   01/21/2023   CA 125    Standing Status:   Standing    Number of Occurrences:   11    Standing Expiration Date:   01/21/2023    All questions were answered. The patient knows to call the clinic with any problems, questions or concerns. The total time spent in the appointment was 40 minutes encounter with patients including review of chart and various tests results, discussions about plan of care and coordination of care plan   Heath Lark, MD 01/20/2022 3:26 PM  INTERVAL HISTORY: Please see below for problem oriented charting. she returns for treatment follow-up and to discuss the role of adjuvant treatment I have reviewed her case with GYN surgeon She will be getting wound VAC placement to promote wound healing  REVIEW OF SYSTEMS:   Constitutional: Denies fevers, chills or abnormal weight loss Eyes: Denies blurriness of  vision Ears, nose, mouth, throat, and face: Denies mucositis or sore throat Respiratory: Denies cough, dyspnea or wheezes Cardiovascular: Denies palpitation, chest discomfort or lower extremity swelling Gastrointestinal:  Denies nausea, heartburn or change in bowel habits Skin: Denies abnormal skin rashes Lymphatics: Denies new lymphadenopathy or easy bruising Neurological:Denies numbness, tingling or new weaknesses Behavioral/Psych: Mood is stable, no new changes  All other systems were reviewed with the patient and are negative.  I have reviewed the past medical history, past surgical history, social history and family history with the patient and they are unchanged from previous note.  ALLERGIES:  is allergic to tylenol [acetaminophen] and latex.  MEDICATIONS:  Current Outpatient Medications  Medication Sig Dispense Refill   dexamethasone (DECADRON) 4 MG tablet Take 2 tabs at the night before and 2 tab the morning of chemotherapy, every 3 weeks, by mouth x 6 cycles 24 tablet 6   cephALEXin (KEFLEX) 500 MG capsule Take 1 capsule (500 mg total) by mouth 2 (two) times daily. 10 capsule 0   escitalopram (LEXAPRO) 10 MG tablet Take 1 tablet (10 mg total) by mouth daily. 90 tablet 3   famotidine (PEPCID) 20 MG tablet Take 20 mg by mouth 2 (two) times daily as needed for heartburn or indigestion. (Patient not taking: Reported on 01/18/2022)     lidocaine-prilocaine (EMLA) cream Apply to affected area once 30 g 3   ondansetron (ZOFRAN) 8 MG tablet Take 1 tablet (8 mg total) by mouth every 8 (eight) hours as needed for nausea or vomiting. Start on the third day after chemotherapy. 30 tablet 1   pantoprazole (PROTONIX) 40 MG tablet Take 1 tablet (40 mg total) by mouth 2 (two) times daily. 120 tablet 0   prochlorperazine (COMPAZINE) 10 MG tablet Take 1 tablet (10 mg total) by mouth every 6 (six) hours  as needed for nausea or vomiting. 30 tablet 1   No current facility-administered medications for  this visit.    SUMMARY OF ONCOLOGIC HISTORY: Oncology History Overview Note  MMR normal MSI stable PD-L1 CPS: 1% Her2 positive P53 + carcinosarcoma   Carcinosarcoma of body of uterus ([place])  09/23/2021 Initial Diagnosis   She presented with PMB   09/28/2021 Imaging   US pelvis Enlarged uterus with fibroids. Pelvic sonogram is otherwise unremarkable.    11/11/2021 Pathology Results   A. CERVIX, MASS, EXCISION:  - HIGH-GRADE UNDIFFERENTIATED MALIGNANCY WITH EXTENSIVE NECROSIS (SEE COMMENT).   COMMENT:  The tumor consists of mostly high-grade sarcomatoid morphology with focal epithelioid morphology.  Immunohistochemical stains are performed on block A4.  The tumor cells are positive for desmin and p16 while negative for CD45, CK7, CK20, chromogranin, synaptophysin, Melan-A, p40, AE1/AE3, SMA, p63, cyclin D1, S100 and smooth muscle myosin.  Ki-67 proliferation index is high.  CD10 staining shows focal nonspecific staining.  The differential diagnosis includes a high-grade sarcoma and a partially sampled carcinosarcoma.    11/25/2021 Imaging   MRI pelvis Marked distention of the entire endometrial cavity by heterogeneously enhancing soft tissue density, which extends through the endocervical canal into the lower vagina. This is highly suspicious for endometrial carcinoma.   Diffuse myometrial thinning due to marked distention of endometrial cavity limits evaluation; deep myometrial invasion cannot be excluded in the uterine fundus.   No evidence of extra-uterine extension of tumor.   Lymphadenopathy in lower abdominal retroperitoneum, bilateral iliac chains, and sigmoid mesocolon, consistent with metastatic disease.   Sigmoid diverticulosis. No radiographic evidence of diverticulitis.   11/26/2021 Initial Diagnosis   Uterine cancer ([place])   11/26/2021 Cancer Staging   Staging form: Corpus Uteri - Carcinoma and Carcinosarcoma, AJCC 8th Edition - Clinical stage from 11/26/2021: FIGO  Stage IIIC2 (cT3, cN2, cM0) - Signed by Heath Lark, MD on 11/26/2021 Stage prefix: Initial diagnosis Histologic grade (G): G3 Histologic grading system: 3 grade system   11/30/2021 Pathology Results   FINAL MICROSCOPIC DIAGNOSIS:  A. UTERINE CONTENTS, BIOPSY: - HIGH-GRADE UNDIFFERENTIATED MALIGNANCY WITH EXTENSIVE NECROSIS (SEE COMMENT).  COMMENT: The patient's history of a high-grade undifferentiated malignancy with extensive necrosis of the cervix is noted.  The morphological features of the current tumor cells are similar to the tumor cells seen in the previous cervical mass excision.  Immunohistochemical staining for desmin and cytokeratin AE1/AE3 is performed on block A1.  The tumor cells are diffusely positive for desmin.   AE1/AE3 stain is focally positive, however this is scant and of unclear clinical significance. Given the small quantity of tissue in the current biopsy, a definitive distinction between a high-grade sarcoma and carcinosarcoma cannot be made as this biopsy may not be representative of the entire tumor. If clinically indicated, this case as well as the previous cervical mass excision ([id number]) may be sent out for expert consultation. Clinical correlation recommended.     12/01/2021 Tumor Marker   Patient's tumor was tested for the following markers: CA-125. Results of the tumor marker test revealed 491.   12/22/2021 Surgery   Robotic-assisted laparoscopic total hysterectomy with bilateral salpingo-oophorectomy, tumor debulking including high common iliac lymph node excision, aborted attempt of bilateral external iliac enlarged lymph nodes, mini-lap for specimen delivery, cystoscopy    12/22/2021 Pathology Results   FINAL MICROSCOPIC DIAGNOSIS:  A. LEFT CERVICAL MARGIN, EXCISION: - Positive for carcinoma  B. HIGH LEFT COMMON ILIAC LYMPH NODE, EXCISION: - Metastatic carcinosarcoma involving the lymph node  C.  UTERUS, CERVIX, BILATERAL FALLOPIAN TUBES AND  OVARIES, RESECTION: - Carcinosarcoma (Mixed Malignant Mullerian Tumor), 13.2 cm, including undifferentiated sarcoma with rhabdoid features and high-grade serous carcinoma, see comment - Tumor invades for a depth of 1 mm where myometrial thickness is 5 mm - Benign bilateral fallopian tubes and ovaries - See oncology table - See comment  ONCOLOGY TABLE:  UTERUS, CARCINOMA OR CARCINOSARCOMA: Resection  Procedure: Total hysterectomy and bilateral salpingo-oophorectomy Histologic Type: Carcinosarcoma (mixed malignant Mullerian tumor) Histologic Grade: High-grade Myometrial Invasion:      Depth of Myometrial Invasion (mm): 1 mm      Myometrial Thickness (mm): 5 mm      Percentage of Myometrial Invasion: 20% Uterine Serosa Involvement: Not identified Cervical stromal Involvement: Present Extent of involvement of other tissue/organs: Not identified Peritoneal/Ascitic Fluid: Not applicable Lymphovascular Invasion: Present Regional Lymph Nodes:      Pelvic Lymph Nodes Examined:                                  0 Sentinel                                  1 Non-sentinel                                  1 Total      Pelvic Lymph Nodes with Metastasis: 1                          Macrometastasis: (>2.0 mm): 1                          Micrometastasis: (>0.2 mm and < 2.0 mm): 0                          Isolated Tumor Cells (<0.2 mm): 0                          Laterality of Lymph Node with Tumor: Left                          Extracapsular Extension: Not identified      Para-aortic Lymph Nodes Examined:                                   0 Sentinel                                   0 non-sentinel                                   0 total Distant Metastasis:      Distant Site(s) Involved: Not applicable Pathologic Stage Classification (pTNM, AJCC 8th Edition): pT1a, pN1 Ancillary Studies: MMR / MSI testing will be ordered Representative Tumor Block: C1 Comment(s): None  ([id number])        01/13/2022 Procedure   Placement of single lumen port a cath via right internal  jugular vein. The catheter tip lies at the cavo-atrial junction. A power injectable port a cath was placed and is ready for immediate use.     01/19/2022 Echocardiogram    1. Left ventricular ejection fraction, by estimation, is 60 to 65%. The left ventricle has normal function. The left ventricle has no regional wall motion abnormalities. Left ventricular diastolic parameters are consistent with Grade I diastolic dysfunction (impaired relaxation). The average left ventricular global longitudinal strain is -24.0 %. The global longitudinal strain is normal.  2. Right ventricular systolic function is normal. The right ventricular size is normal. There is normal pulmonary artery systolic pressure.  3. Left atrial size was mildly dilated.  4. The mitral valve is normal in structure. No evidence of mitral valve regurgitation. No evidence of mitral stenosis.  5. The aortic valve was not well visualized. Aortic valve regurgitation is not visualized. Aortic valve sclerosis is present, with no evidence of aortic valve stenosis.  6. The inferior vena cava is normal in size with greater than 50% respiratory variability, suggesting right atrial pressure of 3 mmHg.     02/01/2022 -  Chemotherapy   Patient is on Treatment Plan : UTERINE SEROUS CARCINOMA Carboplatin + Paclitaxel + Trastuzumab q21d x 6 Cycles / Trastuzumab q21d       PHYSICAL EXAMINATION: ECOG PERFORMANCE STATUS: 1 - Symptomatic but completely ambulatory  GENERAL:alert, no distress and comfortable SKIN: Noted minor skin adhesions at the surgical site  NEURO: alert & oriented x 3 with fluent speech, no focal motor/sensory deficits  LABORATORY DATA:  I have reviewed the data as listed    Component Value Date/Time   NA 138 01/12/2022 1220   NA 140 11/02/2016 1130   K 3.8 01/12/2022 1220   K 3.8 11/02/2016 1130   CL 104 01/12/2022 1220   CL 105 05/02/2012  0822   CO2 25 01/12/2022 1220   CO2 26 11/02/2016 1130   GLUCOSE 98 01/12/2022 1220   GLUCOSE 87 11/02/2016 1130   GLUCOSE 98 05/02/2012 0822   BUN 19 01/12/2022 1220   BUN 14.7 11/02/2016 1130   CREATININE 1.21 (H) 01/12/2022 1220   CREATININE 0.84 12/05/2018 1014   CREATININE 0.8 11/02/2016 1130   CALCIUM 8.7 (L) 01/12/2022 1220   CALCIUM 9.7 11/02/2016 1130   PROT 6.4 (L) 01/12/2022 1220   PROT 6.8 11/02/2016 1130   ALBUMIN 3.0 (L) 01/12/2022 1220   ALBUMIN 3.6 11/02/2016 1130   AST 16 01/12/2022 1220   AST 22 07/25/2017 1425   AST 23 11/02/2016 1130   ALT 12 01/12/2022 1220   ALT 19 07/25/2017 1425   ALT 17 11/02/2016 1130   ALKPHOS 71 01/12/2022 1220   ALKPHOS 59 11/02/2016 1130   BILITOT 0.2 (L) 01/12/2022 1220   BILITOT 0.3 07/25/2017 1425   BILITOT 0.34 11/02/2016 1130   GFRNONAA 47 (L) 01/12/2022 1220   GFRNONAA >60 07/25/2017 1425   GFRAA >60 11/08/2017 0504   GFRAA >60 07/25/2017 1425    No results found for: "SPEP", "UPEP"  Lab Results  Component Value Date   WBC 7.9 01/12/2022   NEUTROABS 7.2 01/05/2022   HGB 9.5 (L) 01/12/2022   HCT 30.5 (L) 01/12/2022   MCV 93.6 01/12/2022   PLT 315 01/12/2022      Chemistry      Component Value Date/Time   NA 138 01/12/2022 1220   NA 140 11/02/2016 1130   K 3.8 01/12/2022 1220   K 3.8 11/02/2016 1130   CL 104  01/12/2022 1220   CL 105 05/02/2012 0822   CO2 25 01/12/2022 1220   CO2 26 11/02/2016 1130   BUN 19 01/12/2022 1220   BUN 14.7 11/02/2016 1130   CREATININE 1.21 (H) 01/12/2022 1220   CREATININE 0.84 12/05/2018 1014   CREATININE 0.8 11/02/2016 1130      Component Value Date/Time   CALCIUM 8.7 (L) 01/12/2022 1220   CALCIUM 9.7 11/02/2016 1130   ALKPHOS 71 01/12/2022 1220   ALKPHOS 59 11/02/2016 1130   AST 16 01/12/2022 1220   AST 22 07/25/2017 1425   AST 23 11/02/2016 1130   ALT 12 01/12/2022 1220   ALT 19 07/25/2017 1425   ALT 17 11/02/2016 1130   BILITOT 0.2 (L) 01/12/2022 1220   BILITOT  0.3 07/25/2017 1425   BILITOT 0.34 11/02/2016 1130       RADIOGRAPHIC STUDIES: I have personally reviewed the radiological images as listed and agreed with the findings in the report. ECHOCARDIOGRAM COMPLETE  Result Date: 01/19/2022    ECHOCARDIOGRAM REPORT   Patient Name:   KHAMYA TOPP [facility] Date of Exam: 01/19/2022 Medical Rec #:  [id number]      Height:       59.5 in Accession #:    [id number]     Weight:       144.6 lb Date of Birth:  February 25, 1945      BSA:          1.616 m Patient Age:    23 years       BP:           135/79 mmHg Patient Gender: F              HR:           81 bpm. Exam Location:  Outpatient Procedure: 2D Echo, 3D Echo, Color Doppler, Cardiac Doppler and Strain Analysis Indications:    Chemo Evaluation  History:        Patient has no prior history of Echocardiogram examinations.                 Risk Factors:Dyslipidemia.  Sonographer:    Raquel Sarna Senior RDCS Referring Phys: [phone] Kymberlie Brazeau [place]  1. Left ventricular ejection fraction, by estimation, is 60 to 65%. The left ventricle has normal function. The left ventricle has no regional wall motion abnormalities. Left ventricular diastolic parameters are consistent with Grade I diastolic dysfunction (impaired relaxation). The average left ventricular global longitudinal strain is -24.0 %. The global longitudinal strain is normal.  2. Right ventricular systolic function is normal. The right ventricular size is normal. There is normal pulmonary artery systolic pressure.  3. Left atrial size was mildly dilated.  4. The mitral valve is normal in structure. No evidence of mitral valve regurgitation. No evidence of mitral stenosis.  5. The aortic valve was not well visualized. Aortic valve regurgitation is not visualized. Aortic valve sclerosis is present, with no evidence of aortic valve stenosis.  6. The inferior vena cava is normal in size with greater than 50% respiratory variability, suggesting right atrial pressure of 3 mmHg.  Comparison(s): No prior Echocardiogram. FINDINGS  Left Ventricle: Left ventricular ejection fraction, by estimation, is 60 to 65%. The left ventricle has normal function. The left ventricle has no regional wall motion abnormalities. The average left ventricular global longitudinal strain is -24.0 %. The global longitudinal strain is normal. The left ventricular internal cavity size was small. There is no left ventricular hypertrophy. Left ventricular diastolic parameters are consistent  with Grade I diastolic dysfunction (impaired relaxation). Right Ventricle: The right ventricular size is normal. No increase in right ventricular wall thickness. Right ventricular systolic function is normal. There is normal pulmonary artery systolic pressure. The tricuspid regurgitant velocity is 2.46 m/s, and  with an assumed right atrial pressure of 3 mmHg, the estimated right ventricular systolic pressure is 08.6 mmHg. Left Atrium: Left atrial size was mildly dilated. Right Atrium: Right atrial size was normal in size. Pericardium: Trivial pericardial effusion is present. Mitral Valve: The mitral valve is normal in structure. No evidence of mitral valve regurgitation. No evidence of mitral valve stenosis. Tricuspid Valve: The tricuspid valve is normal in structure. Tricuspid valve regurgitation is not demonstrated. No evidence of tricuspid stenosis. Aortic Valve: The aortic valve was not well visualized. Aortic valve regurgitation is not visualized. Aortic valve sclerosis is present, with no evidence of aortic valve stenosis. Pulmonic Valve: The pulmonic valve was not well visualized. Pulmonic valve regurgitation is not visualized. No evidence of pulmonic stenosis. Aorta: The aortic root and ascending aorta are structurally normal, with no evidence of dilitation. Venous: The inferior vena cava is normal in size with greater than 50% respiratory variability, suggesting right atrial pressure of 3 mmHg. IAS/Shunts: No atrial level  shunt detected by color flow Doppler.  LEFT VENTRICLE PLAX 2D LVIDd:         3.80 cm   Diastology LVIDs:         2.30 cm   LV e' medial:    8.27 cm/s LV PW:         0.80 cm   LV E/e' medial:  11.1 LV IVS:        0.80 cm   LV e' lateral:   9.25 cm/s LVOT diam:     1.80 cm   LV E/e' lateral: 9.9 LV SV:         58 LV SV Index:   36        2D Longitudinal Strain LVOT Area:     2.54 cm  2D Strain GLS Avg:     -24.0 %  RIGHT VENTRICLE RV S prime:     22.20 cm/s TAPSE (M-mode): 2.1 cm LEFT ATRIUM             Index        RIGHT ATRIUM           Index LA diam:        2.80 cm 1.73 cm/m   RA Area:     17.20 cm LA Vol (A2C):   61.1 ml 37.81 ml/m  RA Volume:   48.20 ml  29.82 ml/m LA Vol (A4C):   54.7 ml 33.85 ml/m LA Biplane Vol: 58.0 ml 35.89 ml/m  AORTIC VALVE LVOT Vmax:   117.00 cm/s LVOT Vmean:  82.800 cm/s LVOT VTI:    0.226 m  AORTA Ao Root diam: 2.80 cm Ao Asc diam:  3.00 cm MITRAL VALVE                TRICUSPID VALVE MV Area (PHT): 4.86 cm     TR Peak grad:   24.2 mmHg MV Decel Time: 156 msec     TR Vmax:        246.00 cm/s MV E velocity: 91.70 cm/s MV A velocity: 111.00 cm/s  SHUNTS MV E/A ratio:  0.83         Systemic VTI:  0.23 m  Systemic Diam: 1.80 cm Rudean Haskell MD Electronically signed by Rudean Haskell MD Signature Date/Time: 01/19/2022/3:53:00 PM    Final    IR IMAGING GUIDED PORT INSERTION  Result Date: 01/12/2022 CLINICAL DATA:  Uterine carcinoma and need for porta cath to begin chemotherapy. EXAM: IMPLANTED PORT A CATH PLACEMENT WITH ULTRASOUND AND FLUOROSCOPIC GUIDANCE ANESTHESIA/SEDATION: Moderate (conscious) sedation was employed during this procedure. A total of Versed 0.5 mg and Fentanyl 25 mcg was administered intravenously by radiology nursing. Moderate Sedation Time: 33 minutes. The patient's level of consciousness and vital signs were monitored continuously by radiology nursing throughout the procedure under my direct supervision. FLUOROSCOPY: 30  seconds.  1.0 mGy. PROCEDURE: The procedure, risks, benefits, and alternatives were explained to the patient. Questions regarding the procedure were encouraged and answered. The patient understands and consents to the procedure. A time-out was performed prior to initiating the procedure. Ultrasound was utilized to confirm patency of the right internal jugular vein. An ultrasound image was saved and recorded. The right neck and chest were prepped with chlorhexidine in a sterile fashion, and a sterile drape was applied covering the operative field. Maximum barrier sterile technique with sterile gowns and gloves were used for the procedure. Local anesthesia was provided with 1% lidocaine. After creating a small venotomy incision, a 21 gauge needle was advanced into the right internal jugular vein under direct, real-time ultrasound guidance. Ultrasound image documentation was performed. After securing guidewire access, an 8 Fr dilator was placed. A J-wire was kinked to measure appropriate catheter length. A subcutaneous port pocket was then created along the upper chest wall utilizing sharp and blunt dissection. Portable cautery was utilized. The pocket was irrigated with sterile saline. A single lumen power injectable port was chosen for placement. The 8 Fr catheter was tunneled from the port pocket site to the venotomy incision. The port was placed in the pocket. External catheter was trimmed to appropriate length based on guidewire measurement. At the venotomy, an 8 Fr peel-away sheath was placed over a guidewire. The catheter was then placed through the sheath and the sheath removed. Final catheter positioning was confirmed and documented with a fluoroscopic spot image. The port was accessed with a needle and aspirated and flushed with heparinized saline. The access needle was removed. The venotomy and port pocket incisions were closed with subcutaneous 3-0 Monocryl and subcuticular 4-0 Vicryl. Dermabond was applied  to both incisions. COMPLICATIONS: COMPLICATIONS None FINDINGS: After catheter placement, the tip lies at the cavo-atrial junction. The catheter aspirates normally and is ready for immediate use. IMPRESSION: Placement of single lumen port a cath via right internal jugular vein. The catheter tip lies at the cavo-atrial junction. A power injectable port a cath was placed and is ready for immediate use. Electronically Signed   By: Aletta Edouard M.D.   On: 01/12/2022 14:25

## 2022-01-20 NOTE — Telephone Encounter (Signed)
Spoke with daughter to troubleshoot and adjust Medela wound vac settings. Pump settings adjusted to -125 mmHg. Pump working properly at the time of call. Daughter had no concerns or questions.

## 2022-01-20 NOTE — Progress Notes (Signed)
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 Pelvic sonogram is otherwise unremarkable.    11/11/2021 Pathology Results   A. CERVIX, MASS, EXCISION:  - HIGH-GRADE UNDIFFERENTIATED MALIGNANCY WITH EXTENSIVE NECROSIS (SEE COMMENT).   COMMENT:  The tumor consists of mostly high-grade sarcomatoid morphology with focal epithelioid morphology.  Immunohistochemical stains are performed on block A4.  The tumor cells are positive for desmin and p16 while negative for CD45, CK7, CK20, chromogranin, synaptophysin, Melan-A, p40, AE1/AE3, SMA, p63, cyclin D1, S100 and smooth muscle myosin.  Ki-67 proliferation index is high.  CD10 staining shows focal nonspecific staining.  The differential diagnosis includes a high-grade sarcoma and a partially sampled carcinosarcoma.    11/25/2021 Imaging   MRI pelvis Marked distention of the entire endometrial cavity by heterogeneously enhancing soft tissue density, which extends through the endocervical canal into the lower vagina. This is highly suspicious for endometrial carcinoma.   Diffuse myometrial thinning due to marked distention of endometrial cavity limits evaluation; deep myometrial invasion cannot be excluded in the uterine fundus.   No evidence of extra-uterine extension of tumor.   Lymphadenopathy in lower abdominal retroperitoneum, bilateral iliac chains, and sigmoid mesocolon, consistent with metastatic disease.   Sigmoid diverticulosis. No radiographic evidence of diverticulitis.   11/26/2021 Initial Diagnosis   Uterine cancer ([place])   11/26/2021 Cancer Staging   Staging form: Corpus Uteri - Carcinoma and  Carcinosarcoma, AJCC 8th Edition - Clinical stage from 11/26/2021: FIGO Stage IIIC2 (cT3, cN2, cM0) - Signed by Heath Lark, MD on 11/26/2021 Stage prefix: Initial diagnosis Histologic grade (G): G3 Histologic grading system: 3 grade system   11/30/2021 Pathology Results   FINAL MICROSCOPIC DIAGNOSIS:  A. UTERINE CONTENTS, BIOPSY: - HIGH-GRADE UNDIFFERENTIATED MALIGNANCY WITH EXTENSIVE NECROSIS (SEE COMMENT).  COMMENT: The patient's history of a high-grade undifferentiated malignancy with extensive necrosis of the cervix is noted.  The morphological features of the current tumor cells are similar to the tumor cells seen in the previous cervical mass excision.  Immunohistochemical staining for desmin and cytokeratin AE1/AE3 is performed on block A1.  The tumor cells are diffusely positive for desmin.   AE1/AE3 stain is focally positive, however this is scant and of unclear clinical significance. Given the small quantity of tissue in the current biopsy, a definitive distinction between a high-grade sarcoma and carcinosarcoma cannot be made as this biopsy may not be representative of the entire tumor. If clinically indicated, this case as well as the previous cervical mass excision ([id number]) may be sent out for expert consultation. Clinical correlation recommended.     12/01/2021 Tumor Marker   Patient's tumor was tested for the following markers: CA-125. Results of the tumor marker test revealed 491.   12/22/2021 Surgery   Robotic-assisted laparoscopic total hysterectomy with bilateral salpingo-oophorectomy, tumor debulking including high common iliac lymph node excision, aborted attempt of bilateral external iliac enlarged lymph nodes, mini-lap for specimen delivery, cystoscopy    12/22/2021 Pathology Results   FINAL MICROSCOPIC DIAGNOSIS:  A. LEFT CERVICAL MARGIN, EXCISION: - Positive for carcinoma  B. HIGH LEFT COMMON ILIAC LYMPH NODE, EXCISION: - Metastatic carcinosarcoma  involving the lymph node  C. UTERUS, CERVIX, BILATERAL FALLOPIAN TUBES AND OVARIES, RESECTION: -  Carcinosarcoma (Mixed Malignant Mullerian Tumor), 13.2 cm, including undifferentiated sarcoma with rhabdoid features and high-grade serous carcinoma, see comment - Tumor invades for a depth of 1 mm where myometrial thickness is 5 mm - Benign bilateral fallopian tubes and ovaries - See oncology table - See comment  ONCOLOGY TABLE:  UTERUS, CARCINOMA OR CARCINOSARCOMA: Resection  Procedure: Total hysterectomy and bilateral salpingo-oophorectomy Histologic Type: Carcinosarcoma (mixed malignant Mullerian tumor) Histologic Grade: High-grade Myometrial Invasion:      Depth of Myometrial Invasion (mm): 1 mm      Myometrial Thickness (mm): 5 mm      Percentage of Myometrial Invasion: 20% Uterine Serosa Involvement: Not identified Cervical stromal Involvement: Present Extent of involvement of other tissue/organs: Not identified Peritoneal/Ascitic Fluid: Not applicable Lymphovascular Invasion: Present Regional Lymph Nodes:      Pelvic Lymph Nodes Examined:                                  0 Sentinel                                  1 Non-sentinel                                  1 Total      Pelvic Lymph Nodes with Metastasis: 1                          Macrometastasis: (>2.0 mm): 1                          Micrometastasis: (>0.2 mm and < 2.0 mm): 0                          Isolated Tumor Cells (<0.2 mm): 0                          Laterality of Lymph Node with Tumor: Left                          Extracapsular Extension: Not identified      Para-aortic Lymph Nodes Examined:                                   0 Sentinel                                   0 non-sentinel                                   0 total Distant Metastasis:      Distant Site(s) Involved: Not applicable Pathologic Stage Classification (pTNM, AJCC 8th Edition): pT1a, pN1 Ancillary Studies: MMR / MSI testing will be  ordered Representative Tumor Block: C1 Comment(s): None  ([id number])       01/13/2022 Procedure   Placement of single lumen port a cath via right internal jugular vein. The catheter tip lies at the cavo-atrial junction.  A power injectable port a cath was placed and is ready for immediate use.     01/19/2022 Echocardiogram    1. Left ventricular ejection fraction, by estimation, is 60 to 65%. The left ventricle has normal function. The left ventricle has no regional wall motion abnormalities. Left ventricular diastolic parameters are consistent with Grade I diastolic dysfunction (impaired relaxation). The average left ventricular global longitudinal strain is -24.0 %. The global longitudinal strain is normal.  2. Right ventricular systolic function is normal. The right ventricular size is normal. There is normal pulmonary artery systolic pressure.  3. Left atrial size was mildly dilated.  4. The mitral valve is normal in structure. No evidence of mitral valve regurgitation. No evidence of mitral stenosis.  5. The aortic valve was not well visualized. Aortic valve regurgitation is not visualized. Aortic valve sclerosis is present, with no evidence of aortic valve stenosis.  6. The inferior vena cava is normal in size with greater than 50% respiratory variability, suggesting right atrial pressure of 3 mmHg.     01/21/2022 -  Chemotherapy   Patient is on Treatment Plan : UTERINE SEROUS CARCINOMA Carboplatin + Paclitaxel + Trastuzumab q21d x 6 Cycles / Trastuzumab q21d       Interval History: The patient is overall doing well.  Dressing changes have been going well.  Unfortunately, home health came out and was not able to put a VAC on.  Patient denies any vaginal bleeding or discharge.  Reports baseline bowel bladder function.  Denies any fevers or chills.  Past Medical/Surgical History: Past Medical History:  Diagnosis Date   Adjustment disorder with anxious mood 12/05/2018   Anemia     Anxiety    Arthralgia 06/29/2015   Arthritis    Breast cancer ([place]) 2010   Colon polyps    ?   Complication of anesthesia    DCIS (ductal carcinoma in situ) of breast 2010   Depression    Diverticulosis    Full dentures    Hyperlipidemia    Joint pain    Leukocytosis 07/11/2017   PONV (postoperative nausea and vomiting)    Woke up during surgery   Skin cancer    SCC   Wears glasses     Past Surgical History:  Procedure Laterality Date   BREAST BIOPSY Right 05/11/2012   BREAST BIOPSY Right 04/24/2012   BREAST LUMPECTOMY Right 12/2008   cancer-tamoxifen   BREAST RECONSTRUCTION WITH PLACEMENT OF TISSUE EXPANDER AND FLEX HD (ACELLULAR HYDRATED DERMIS) Right 06/20/2012   Procedure: BREAST RECONSTRUCTION WITH PLACEMENT OF TISSUE EXPANDER AND FLEX HD (ACELLULAR HYDRATED DERMIS);  Surgeon: Theodoro Kos, DO;  Location: [facility];  Service: Plastics;  Laterality: Right;   BREAST REDUCTION WITH MASTOPEXY Left 10/11/2012   Procedure: LEFT BREAST REDUCTION WITH MASTOPEXY;  Surgeon: Theodoro Kos, DO;  Location: [facility];  Service: Plastics;  Laterality: Left;   cataract sugery Bilateral 03/2019   COLONOSCOPY  08/28/2013   [facility]   DILATION AND CURETTAGE OF UTERUS     heavy bleeding between two pregnancies   IR IMAGING GUIDED PORT INSERTION  01/12/2022   LAPAROTOMY N/A 01/12/2022   Procedure: incision drainage of abdominal incision wound;  Surgeon: Lahoma Crocker, MD;  Location: [facility];  Service: Gynecology;  Laterality: N/A;   LIPOSUCTION WITH LIPOFILLING Left 10/11/2012   Procedure: LIPOSUCTION WITH LIPOFILLING;  Surgeon: Theodoro Kos, DO;  Location: [facility];  Service: Plastics;  Laterality: Left;   LYMPH NODE DISSECTION N/A  12/22/2021   Procedure: LYMPH NODE DISSECTION;  Surgeon: Lafonda Mosses, MD;  Location: [facility];  Service: Gynecology;  Laterality: N/A;   MASTECTOMY Right 06/20/2012   MASTECTOMY W/ SENTINEL NODE BIOPSY  Right 06/20/2012   Procedure: right skin sparing MASTECTOMY WITH SENTINEL LYMPH NODE BIOPSY;  Surgeon: Stark Klein, MD;  Location: [facility];  Service: General;  Laterality: Right;   PARATHYROIDECTOMY Left 11/07/2017   Procedure: NECK EXPLORATION WITH LEFT THYROID LOBECTOMY;  Surgeon: [name], MD;  Location: [facility];  Service: General;  Laterality: Left;   POLYPECTOMY     REDUCTION MAMMAPLASTY Left 10/2012   REMOVAL OF TISSUE EXPANDER AND PLACEMENT OF IMPLANT Right 10/11/2012   Procedure: REMOVAL OF TISSUE EXPANDER AND PLACEMENT OF SILICONE IMPLANT RIGHT;  Surgeon: Theodoro Kos, DO;  Location: [facility];  Service: Plastics;  Laterality: Right;    Family History  Problem Relation Age of Onset   Alcohol abuse Mother    Colon polyps Mother    Mental illness Mother    Alcohol abuse Father        suicide   Mental illness Father    Cancer Sister        gyn cancer   Heart disease Paternal Grandfather    Colon cancer Neg Hx    Esophageal cancer Neg Hx    Rectal cancer Neg Hx    Stomach cancer Neg Hx    Breast cancer Neg Hx    Ovarian cancer Neg Hx    Endometrial cancer Neg Hx    Pancreatic cancer Neg Hx    Prostate cancer Neg Hx     Social History   Socioeconomic History   Marital status: Married    Spouse name: Not on file   Number of children: 2   Years of education: Not on file   Highest education level: Not on file  Occupational History   Occupation: retired LPN  Tobacco Use   Smoking status: Former    Types: Cigarettes    Quit date: 06/14/1981    Years since quitting: 40.6   Smokeless tobacco: Never  Vaping Use   Vaping Use: Never used  Substance and Sexual Activity   Alcohol use: No   Drug use: No   Sexual activity: Not Currently    Birth control/protection: Post-menopausal  Other Topics Concern   Not on file  Social History Narrative   Recently moved from [place] to be closer to her two daughters and grandchildren.   Retired  Corporate treasurer.   DNR- forms singed on 05/07/2012 and returned to pt.   Social Determinants of Health   Financial Resource Strain: Low Risk  (02/02/2021)   Overall Financial Resource Strain (CARDIA)    Difficulty of Paying Living Expenses: Not hard at all  Food Insecurity: No Food Insecurity (11/30/2021)   Hunger Vital Sign    Worried About Running Out of Food in the Last Year: Never true    Ran Out of Food in the Last Year: Never true  Transportation Needs: No Transportation Needs (11/30/2021)   PRAPARE - Hydrologist (Medical): No    Lack of Transportation (Non-Medical): No  Physical Activity: Insufficiently Active (02/02/2021)   Exercise Vital Sign    Days of Exercise per Week: 3 days    Minutes of Exercise per Session: 30 min  Stress: No Stress Concern Present (02/02/2021)   [place]    Feeling of Stress :  Not at all  Social Connections: Moderately Integrated (02/02/2021)   Social Connection and Isolation Panel [NHANES]    Frequency of Communication with Friends and Family: Twice a week    Frequency of Social Gatherings with Friends and Family: Twice a week    Attends Religious Services: Never    Marine scientist or Organizations: Yes    Attends Music therapist: More than 4 times per year    Marital Status: Married    Current Medications:  Current Outpatient Medications:    cephALEXin (KEFLEX) 500 MG capsule, Take 1 capsule (500 mg total) by mouth 2 (two) times daily., Disp: 10 capsule, Rfl: 0   escitalopram (LEXAPRO) 10 MG tablet, Take 1 tablet (10 mg total) by mouth daily., Disp: 90 tablet, Rfl: 3   pantoprazole (PROTONIX) 40 MG tablet, Take 1 tablet (40 mg total) by mouth 2 (two) times daily., Disp: 120 tablet, Rfl: 0   famotidine (PEPCID) 20 MG tablet, Take 20 mg by mouth 2 (two) times daily as needed for heartburn or indigestion. (Patient not taking: Reported on  01/18/2022), Disp: , Rfl:   Review of Systems: + ringing in ears, anxiety, depression Denies appetite changes, fevers, chills, fatigue, unexplained weight changes. Denies hearing loss, neck lumps or masses, mouth sores, ringing in ears or voice changes. Denies cough or wheezing.  Denies shortness of breath. Denies chest pain or palpitations. Denies leg swelling. Denies abdominal distention, pain, blood in stools, constipation, diarrhea, nausea, vomiting, or early satiety. Denies pain with intercourse, dysuria, frequency, hematuria or incontinence. Denies hot flashes, pelvic pain, vaginal bleeding or vaginal discharge.   Denies joint pain, back pain or muscle pain/cramps. Denies itching, rash, or wounds. Denies dizziness, headaches, numbness or seizures. Denies swollen lymph nodes or glands, denies easy bruising or bleeding. Denies anxiety, depression, confusion, or decreased concentration.  Physical Exam: BP (!) 146/83 (BP Location: Right Arm, Patient Position: Sitting) Comment: provider notified  Pulse 92   Temp 98.2 F (36.8 C) (Oral)   Resp 16   Ht 4' 11.5" (1.511 m)   Wt 143 lb 3.2 oz (65 kg)   SpO2 97%   BMI 28.44 kg/m  General: Alert, oriented, no acute distress. HEENT: Normocephalic, atraumatic, sclera anicteric. Chest: Unlabored breathing on room air. Abdomen: soft, nontender.  Normoactive bowel sounds.  No masses or hepatosplenomegaly appreciated.  Laparoscopic incisions healing well.  Dressing taken off of mini lap incision and packing removed.  Significant recession of erythema previously marked on the skin.  The wound base is more shallow than the last time I saw her.  The wound itself has excellent granulation tissue.  No significant exudate. Extremities: Grossly normal range of motion.  Warm, well perfused.  No edema bilaterally. GU: Normal appearing external genitalia without erythema, excoriation, or lesions.  Speculum exam reveals cuff intact visually, suture visible,  no blood or discharge within the vaginal vault.  Bimanual exam reveals cuff intact, no fluctuance or tenderness to palpation.    Laboratory & Radiologic Studies: See recent surgical pathology above  Assessment & Plan: Kayla Mcgrath is a 77 y.o. woman with Stage IIIC2ii uterine carcinosarcoma (serous component) who presents for follow-up after surgery.  Patient is doing well postoperatively.  She developed a superficial wound infection requiring incision and drainage on 1/3.  She has been doing wet-to-dry dressings and was treated with a course of antibiotics.  She overall remains without systemic symptoms and on exam her wound is healing very well.  I had previously  talked with her about the possibility of a wound VAC to help speed up recovery.  There was some issues with home health not being able to put on the wound VAC that she received.  Will plan to place this in clinic today.  I am hopeful that she will just need to have this in place for 2 -3 weeks.  From a treatment standpoint, I think she would be ready to start chemotherapy in approximately 2 weeks.  The patient voiced some symptoms of depression as well as brief suicidal ideation yesterday.  This was in the setting of multiple stressors around her wound.  I help to normalize these feelings and we discussed some the difficulty with a new cancer diagnosis and all of the treatment and conversations that surround this diagnosis.  Additionally, the patient has required multiple hospitalizations within a relatively short period of time.  I for the support of our social work team.  The patient was interested in this.  Referral placed today.  Final pathology from surgery was reviewed with the patient and her daughter.  We discussed again briefly findings at the time of surgery.  We discussed plan for adjuvant chemotherapy with repeat imaging after 6 cycles to assess status of pelvic lymph nodes that were not able to be resected.  May consider pelvic  radiation to these lymph nodes depending on response to chemotherapy.  22 minutes of total time was spent for this patient encounter, including preparation, face-to-face counseling with the patient and coordination of care, and documentation of the encounter.  Jeral Pinch, MD  Division of Gynecologic Oncology  Department of Obstetrics and Gynecology  [facility]

## 2022-01-20 NOTE — Assessment & Plan Note (Signed)
Her postsurgical course was complicated by poor wound healing After much discussion with GYN surgeon, we are in agreement to start with chemotherapy approximately 2 weeks from now I reviewed echocardiogram report  We reviewed the NCCN guidelines The treatment is based on publication as follows: We discussed the role of chemotherapy. The intent is of curative intent.  Randomized Phase II Trial of Carboplatin-Paclitaxel Versus Carboplatin-Paclitaxel-Trastuzumab in Uterine Serous Carcinomas That Overexpress Human Epidermal Growth Factor Receptor 2/neu  Yvonne Kendall. Mickel Duhamel. [place], Eric Girard, Natalia [place], [place], [place], [place] K. Josiah Lobo, [address], Mickel Baas [place]. [place], Floor Backes, [place], Babak [place], Dirk [place], Sharmon Ley[place], Alyssa [place], [address], Stefania [place], Masoud [place], Babak [place], [place], Dan-Arin Silasi, Jarvis Morgan, and Heloise [place]. [place]  Purpose Uterine serous carcinoma is a rare, aggressive variant of endometrial cancer. Trastuzumab is a humanized monoclonal antibody that targets human epidermal growth factor receptor 2 (HER2)/neu, a receptor overexpressed in 30%of uterine serous carcinoma. This multicenter, randomized phase II trial compared carboplatin-paclitaxel with and without trastuzumab in patients with advanced or recurrent uterine serous carcinoma who overexpress HER2/neu.  Methods Eligible patients had primary stage III or IV or recurrent HER2/neu-positive disease. Participants were randomly assigned to receive carboplatin-paclitaxel (control arm) for six cycles with or without intravenous trastuzumab (experimental arm) until progression or unacceptable toxicity. The primary end point was progression-free survival, which was assessed for differences between treatment arms via one-sided log-rank tests.  Results From August 2011 to March 2017, 61 patients were randomly assigned.  Forty progression-free survival-related events occurred among 58 evaluable participants. Among all patients, median progression-free survivalwas 8.75month (control) versus 12.643month(experimental; P = .005; hazard ratio [HR], 0.44; 90% CI, 0.26 to 0.76). Similarly, median progression-free survival was 9.3 (control) versus 17.9 (experimental)months among 41 patients with stage III or IV disease undergoing primary treatment (P = .013; HR, 0.40; 90% CI, 0.20 to 0.80) and 6.0 (control) versus 9.2 months (experimental), respectively, among 17 patients with recurrent disease (P = .003; HR, 0.14; 90% CI, 0.04 to 0.53). Toxicitywas not different between treatment arms, and no unexpected safety signals emerged.   Conclusion Addition of trastuzumab to carboplatin-paclitaxel was well tolerated and increased progression-free survival. These encouraging results deserve further investigation to determine their impact on overall survival in patients with advanced or recurrent uterine serous carcinoma who overexpress HER2/neu.  J Clin OnUXLKG [id number] 2018 by American Society of Clinical Oncology   We discussed some of the risks, benefits, side-effects of carboplatin, Taxol and Herceptin. Treatment is intravenous, every 3 weeks x 6 cycles  Some of the short term side-effects included, though not limited to, including weight loss, life threatening infections, risk of allergic reactions, need for transfusions of blood products, nausea, vomiting, change in bowel habits, congestive heart failure, loss of hair, admission to hospital for various reasons, and risks of death.   Long term side-effects are also discussed including risks of infertility, permanent damage to nerve function, hearing loss, chronic fatigue, kidney damage with possibility needing hemodialysis, and rare secondary malignancy including bone marrow disorders.  The patient is aware that the response rates discussed earlier is not guaranteed.  After a  long discussion, patient made an informed decision to proceed with the prescribed plan of care.   Patient education material was dispensed. We discussed premedication with dexamethasone before chemotherapy. She does not need prophylactic GCSF support I will get her started on chemotherapy within the next 2 weeks She is scheduled for  chemo education class next week

## 2022-01-21 ENCOUNTER — Inpatient Hospital Stay: Payer: Medicare HMO | Admitting: Licensed Clinical Social Worker

## 2022-01-21 NOTE — Telephone Encounter (Signed)
[employer] manager from [employer] called back.  She apologized that the nurse on Wednesday did not know how to apply the wound vac.  She is going to provide education on [place] for the next nurse that is going to see [place] and also talk to the nurse that went out on Wednesday and provide more education.  She said Yosselin declined a nurse visit on Saturday and was advised that Caryssa will be seen in the clinic on Monday.  She will make sure that the nurse seeing Kynsie on Wednesday will be educated on how to change the dressing for the Medela Wound Vac.

## 2022-01-21 NOTE — Telephone Encounter (Signed)
Left another message for [facility].  Requested a return call.

## 2022-01-21 NOTE — Progress Notes (Signed)
[place] Work  Clinical Social Work was referred by medical provider for assessment of psychosocial needs.  Clinical Social Worker contacted patient by phone  to offer support and assess for needs.   Pt is dealing with adjustment to her diagnosis and treatment, including frustrating set backs with healing and consequent delay in chemo. Pt is nervous about that, but ready to get started. This is complicated by pt being the primary caregiver for her husband with memory issues.  She does have good support from family, including her daughter who is an Therapist, sports.  CSW and patient discussed common feeling and emotions when being diagnosed with cancer, and the importance of support during treatment.  CSW informed patient of the support team and support services at [facility].  CSW provided contact information and encouraged patient to call with any questions or concerns.  CSW will plan to meet pt in person during first infusion on 02/01/22 to provide support calendar.   [place], [place] Worker [employer]

## 2022-01-24 ENCOUNTER — Telehealth: Payer: Self-pay | Admitting: Oncology

## 2022-01-24 ENCOUNTER — Inpatient Hospital Stay (HOSPITAL_BASED_OUTPATIENT_CLINIC_OR_DEPARTMENT_OTHER): Payer: Medicare HMO | Admitting: Gynecologic Oncology

## 2022-01-24 ENCOUNTER — Other Ambulatory Visit: Payer: Self-pay

## 2022-01-24 VITALS — BP 143/58 | HR 84 | Temp 97.8°F | Resp 14 | Wt 146.6 lb

## 2022-01-24 DIAGNOSIS — Z5189 Encounter for other specified aftercare: Secondary | ICD-10-CM

## 2022-01-24 DIAGNOSIS — Z90710 Acquired absence of both cervix and uterus: Secondary | ICD-10-CM

## 2022-01-24 DIAGNOSIS — T8131XD Disruption of external operation (surgical) wound, not elsewhere classified, subsequent encounter: Secondary | ICD-10-CM

## 2022-01-24 DIAGNOSIS — Z90722 Acquired absence of ovaries, bilateral: Secondary | ICD-10-CM

## 2022-01-24 DIAGNOSIS — T8149XA Infection following a procedure, other surgical site, initial encounter: Secondary | ICD-10-CM

## 2022-01-24 DIAGNOSIS — L821 Other seborrheic keratosis: Secondary | ICD-10-CM | POA: Diagnosis not present

## 2022-01-24 DIAGNOSIS — D225 Melanocytic nevi of trunk: Secondary | ICD-10-CM | POA: Diagnosis not present

## 2022-01-24 NOTE — Patient Instructions (Signed)
Today we changed the negative pressure dressing on your abdominal incision. The wound is healing well and the measurements are getting smaller which is good news!   Home health should be coming on Wednesday to change the dressing. Please let us know if you do not hear from them.  We will reach out to the representative for the dressing company about obtaining a smaller sponge.

## 2022-01-24 NOTE — Telephone Encounter (Signed)
Kayla Mcgrath with [employer] regarding wound vac supplies.  Advised Phil is getting a lot of supplies and the sponges are too big for the size of her wound.  She is going to adjust the amount of supplies needed and said Kayla Mcgrath can bring in her excess supplies when she has the vac removed and they will pick them up.  Left a message for Dawna with the above information.

## 2022-01-24 NOTE — Progress Notes (Signed)
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 Pelvic sonogram is otherwise unremarkable.    11/11/2021 Pathology Results   A. CERVIX, MASS, EXCISION:  - HIGH-GRADE UNDIFFERENTIATED MALIGNANCY WITH EXTENSIVE NECROSIS (SEE COMMENT).   COMMENT:  The tumor consists of mostly high-grade sarcomatoid morphology with focal epithelioid morphology.  Immunohistochemical stains are performed on block A4.  The tumor cells are positive for desmin and p16 while negative for CD45, CK7, CK20, chromogranin, synaptophysin, Melan-A, p40, AE1/AE3, SMA, p63, cyclin D1, S100 and smooth muscle myosin.  Ki-67 proliferation index is high.  CD10 staining shows focal nonspecific staining.  The differential diagnosis includes a high-grade sarcoma and a partially sampled carcinosarcoma.    11/25/2021 Imaging   MRI pelvis Marked distention of the entire endometrial cavity by heterogeneously enhancing soft tissue density, which extends through the endocervical canal into the lower vagina. This is highly suspicious for endometrial carcinoma.   Diffuse myometrial thinning due to marked distention of endometrial cavity limits evaluation; deep myometrial invasion cannot be excluded in the uterine fundus.   No evidence of extra-uterine extension of tumor.   Lymphadenopathy in lower abdominal retroperitoneum, bilateral iliac chains, and sigmoid mesocolon, consistent with metastatic disease.   Sigmoid diverticulosis. No radiographic evidence of diverticulitis.   11/26/2021 Initial Diagnosis   Uterine cancer ([place])   11/26/2021  Cancer Staging   Staging form: Corpus Uteri - Carcinoma and Carcinosarcoma, AJCC 8th Edition - Clinical stage from 11/26/2021: FIGO Stage IIIC2 (cT3, cN2, cM0) - Signed by Heath Lark, MD on 11/26/2021 Stage prefix: Initial diagnosis Histologic grade (G): G3 Histologic grading system: 3 grade system   11/30/2021 Pathology Results   FINAL MICROSCOPIC DIAGNOSIS:  A. UTERINE CONTENTS, BIOPSY: - HIGH-GRADE UNDIFFERENTIATED MALIGNANCY WITH EXTENSIVE NECROSIS (SEE COMMENT).  COMMENT: The patient's history of a high-grade undifferentiated malignancy with extensive necrosis of the cervix is noted.  The morphological features of the current tumor cells are similar to the tumor cells seen in the previous cervical mass excision.  Immunohistochemical staining for desmin and cytokeratin AE1/AE3 is performed on block A1.  The tumor cells are diffusely positive for desmin.   AE1/AE3 stain is focally positive, however this is scant and of unclear clinical significance. Given the small quantity of tissue in the current biopsy, a definitive distinction between a high-grade sarcoma and carcinosarcoma cannot be made as this biopsy may not be representative of the entire tumor. If clinically indicated, this case as well as the previous cervical mass excision ([id number]) may be sent out for expert consultation. Clinical correlation recommended.     12/01/2021 Tumor Marker   Patient's tumor was tested for the following markers: CA-125. Results of the tumor marker test revealed 491.   12/22/2021 Surgery   Robotic-assisted laparoscopic total hysterectomy with bilateral salpingo-oophorectomy, tumor debulking including high common iliac lymph node excision, aborted attempt of bilateral external iliac enlarged lymph nodes, mini-lap for specimen delivery, cystoscopy    12/22/2021 Pathology Results   FINAL MICROSCOPIC DIAGNOSIS:  A. LEFT CERVICAL MARGIN, EXCISION: - Positive for carcinoma  B. HIGH LEFT COMMON  ILIAC LYMPH NODE, EXCISION: - Metastatic carcinosarcoma involving the lymph node  C. 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, including undifferentiated sarcoma with rhabdoid features and high-grade serous carcinoma, see comment - Tumor invades for a depth of 1 mm where myometrial thickness is 5 mm - Benign bilateral fallopian tubes and ovaries - See oncology table - See comment  ONCOLOGY TABLE:  UTERUS, CARCINOMA OR CARCINOSARCOMA: Resection  Procedure: Total hysterectomy and bilateral salpingo-oophorectomy Histologic Type: Carcinosarcoma (mixed malignant Mullerian tumor) Histologic Grade: High-grade Myometrial Invasion:      Depth of Myometrial Invasion (mm): 1 mm      Myometrial Thickness (mm): 5 mm      Percentage of Myometrial Invasion: 20% Uterine Serosa Involvement: Not identified Cervical stromal Involvement: Present Extent of involvement of other tissue/organs: Not identified Peritoneal/Ascitic Fluid: Not applicable Lymphovascular Invasion: Present Regional Lymph Nodes:      Pelvic Lymph Nodes Examined:                                  0 Sentinel                                  1 Non-sentinel                                  1 Total      Pelvic Lymph Nodes with Metastasis: 1                          Macrometastasis: (>2.0 mm): 1                          Micrometastasis: (>0.2 mm and < 2.0 mm): 0                          Isolated Tumor Cells (<0.2 mm): 0                          Laterality of Lymph Node with Tumor: Left                          Extracapsular Extension: Not identified      Para-aortic Lymph Nodes Examined:                                   0 Sentinel                                   0 non-sentinel                                   0 total Distant Metastasis:      Distant Site(s) Involved: Not applicable Pathologic Stage Classification (pTNM, AJCC 8th Edition):  pT1a, pN1 Ancillary Studies: MMR / MSI testing will be ordered Representative Tumor Block: C1 Comment(s): None  ([id number])       01/13/2022 Procedure   Placement of single lumen port a cath via right internal  jugular vein. The catheter tip lies at the cavo-atrial junction. A power injectable port a cath was placed and is ready for immediate use.     01/19/2022 Echocardiogram    1. Left ventricular ejection fraction, by estimation, is 60 to 65%. The left ventricle has normal function. The left ventricle has no regional wall motion abnormalities. Left ventricular diastolic parameters are consistent with Grade I diastolic dysfunction (impaired relaxation). The average left ventricular global longitudinal strain is -24.0 %. The global longitudinal strain is normal.  2. Right ventricular systolic function is normal. The right ventricular size is normal. There is normal pulmonary artery systolic pressure.  3. Left atrial size was mildly dilated.  4. The mitral valve is normal in structure. No evidence of mitral valve regurgitation. No evidence of mitral stenosis.  5. The aortic valve was not well visualized. Aortic valve regurgitation is not visualized. Aortic valve sclerosis is present, with no evidence of aortic valve stenosis.  6. The inferior vena cava is normal in size with greater than 50% respiratory variability, suggesting right atrial pressure of 3 mmHg.     01/27/2022 Tumor Marker   Patient's tumor was tested for the following markers: CA-125. Results of the tumor marker test revealed 307.   02/01/2022 -  Chemotherapy   Patient is on Treatment Plan : UTERINE SEROUS CARCINOMA Carboplatin + Paclitaxel + Trastuzumab q21d x 6 Cycles / Trastuzumab q21d      On 01/12/22, she underwent: 1.  Incision and drainage of abdominal wall/wound infection abscess. 2.  Debridement of abdominal wall/wound fibrinous exudate, both subcutaneous and fascia.  She was initially sent home on clindamycin and  changed to keflex based on culture results.  Wound culture returned with: RARE ESCHERICHIA COLI , ABUNDANT BACTEROIDES THETAIOTAOMICRON , BETA LACTAMASE POSITIVE   Interval History: The patient reports doing well since last office visit.  She denies fever or chills. No concerns voiced about the negative pressure dressing over the weekend. She did not experience alarms etc and dressing stayed intact. She has minimal pain around the incision and has twinges related to the suction from the dressing. She is able to do daily activities.  Continues to endorse normal bowel and bladder function.  Denies any vaginal bleeding. Tolerating p.o. without nausea or emesis. All questions answered.   Past Medical/Surgical History: Past Medical History:  Diagnosis Date   Adjustment disorder with anxious mood 12/05/2018   Anemia    Anxiety    Arthralgia 06/29/2015   Arthritis    Breast cancer ([place]) 2010   Colon polyps    ?   Complication of anesthesia    DCIS (ductal carcinoma in situ) of breast 2010   Depression    Diverticulosis    Full dentures    Hyperlipidemia    Joint pain    Leukocytosis 07/11/2017   PONV (postoperative nausea and vomiting)    Woke up during surgery   Skin cancer    SCC   Wears glasses     Past Surgical History:  Procedure Laterality Date   BREAST BIOPSY Right 05/11/2012   BREAST BIOPSY Right 04/24/2012   BREAST LUMPECTOMY Right 12/2008   cancer-tamoxifen   BREAST RECONSTRUCTION WITH PLACEMENT OF TISSUE EXPANDER AND FLEX HD (ACELLULAR HYDRATED DERMIS) Right 06/20/2012   Procedure: BREAST RECONSTRUCTION WITH PLACEMENT OF TISSUE EXPANDER AND FLEX HD (ACELLULAR HYDRATED DERMIS);  Surgeon: Theodoro Kos, DO;  Location: [place];  Service: Plastics;  Laterality: Right;   BREAST REDUCTION WITH MASTOPEXY Left 10/11/2012  Procedure: LEFT BREAST REDUCTION WITH MASTOPEXY;  Surgeon: Theodoro Kos, DO;  Location: [place];  Service: Plastics;   Laterality: Left;   cataract sugery Bilateral 03/2019   COLONOSCOPY  08/28/2013   [place]   DILATION AND CURETTAGE OF UTERUS     heavy bleeding between two pregnancies   IR IMAGING GUIDED PORT INSERTION  01/12/2022   LAPAROTOMY N/A 01/12/2022   Procedure: incision drainage of abdominal incision wound;  Surgeon: Lahoma Crocker, MD;  Location: [facility];  Service: Gynecology;  Laterality: N/A;   LIPOSUCTION WITH LIPOFILLING Left 10/11/2012   Procedure: LIPOSUCTION WITH LIPOFILLING;  Surgeon: Theodoro Kos, DO;  Location: [place];  Service: Plastics;  Laterality: Left;   LYMPH NODE DISSECTION N/A 12/22/2021   Procedure: LYMPH NODE DISSECTION;  Surgeon: Lafonda Mosses, MD;  Location: [facility];  Service: Gynecology;  Laterality: N/A;   MASTECTOMY Right 06/20/2012   MASTECTOMY W/ SENTINEL NODE BIOPSY Right 06/20/2012   Procedure: right skin sparing MASTECTOMY WITH SENTINEL LYMPH NODE BIOPSY;  Surgeon: Stark Klein, MD;  Location: [place];  Service: General;  Laterality: Right;   PARATHYROIDECTOMY Left 11/07/2017   Procedure: NECK EXPLORATION WITH LEFT THYROID LOBECTOMY;  Surgeon: [name], MD;  Location: [facility];  Service: General;  Laterality: Left;   POLYPECTOMY     REDUCTION MAMMAPLASTY Left 10/2012   REMOVAL OF TISSUE EXPANDER AND PLACEMENT OF IMPLANT Right 10/11/2012   Procedure: REMOVAL OF TISSUE EXPANDER AND PLACEMENT OF SILICONE IMPLANT RIGHT;  Surgeon: Theodoro Kos, DO;  Location: [place];  Service: Plastics;  Laterality: Right;    Family History  Problem Relation Age of Onset   Alcohol abuse Mother    Colon polyps Mother    Mental illness Mother    Alcohol abuse Father        suicide   Mental illness Father    Cancer Sister        gyn cancer   Heart disease Paternal Grandfather    Colon cancer Neg Hx    Esophageal cancer Neg Hx    Rectal cancer Neg Hx    Stomach cancer Neg Hx    Breast cancer Neg Hx    Ovarian  cancer Neg Hx    Endometrial cancer Neg Hx    Pancreatic cancer Neg Hx    Prostate cancer Neg Hx     Social History   Socioeconomic History   Marital status: Married    Spouse name: Not on file   Number of children: 2   Years of education: Not on file   Highest education level: Not on file  Occupational History   Occupation: retired LPN  Tobacco Use   Smoking status: Former    Types: Cigarettes    Quit date: 06/14/1981    Years since quitting: 40.6   Smokeless tobacco: Never  Vaping Use   Vaping Use: Never used  Substance and Sexual Activity   Alcohol use: No   Drug use: No   Sexual activity: Not Currently    Birth control/protection: Post-menopausal  Other Topics Concern   Not on file  Social History Narrative   Recently moved from [place] to be closer to her two daughters and grandchildren.   Retired Corporate treasurer.   DNR- forms singed on 05/07/2012 and returned to pt.   Social Determinants of Health   Financial Resource Strain: Low Risk  (02/02/2021)   Overall Financial Resource Strain (CARDIA)    Difficulty of Paying Living Expenses:  Not hard at all  Food Insecurity: No Food Insecurity (11/30/2021)   Hunger Vital Sign    Worried About Running Out of Food in the Last Year: Never true    Ran Out of Food in the Last Year: Never true  Transportation Needs: No Transportation Needs (11/30/2021)   PRAPARE - Hydrologist (Medical): No    Lack of Transportation (Non-Medical): No  Physical Activity: Insufficiently Active (02/02/2021)   Exercise Vital Sign    Days of Exercise per Week: 3 days    Minutes of Exercise per Session: 30 min  Stress: No Stress Concern Present (02/02/2021)   [place]    Feeling of Stress : Not at all  Social Connections: Moderately Integrated (02/02/2021)   Social Connection and Isolation Panel [NHANES]    Frequency of Communication with Friends and Family: Twice a  week    Frequency of Social Gatherings with Friends and Family: Twice a week    Attends Religious Services: Never    Marine scientist or Organizations: Yes    Attends Music therapist: More than 4 times per year    Marital Status: Married    Current Medications:  Current Outpatient Medications:    cephALEXin (KEFLEX) 500 MG capsule, Take 1 capsule (500 mg total) by mouth 2 (two) times daily., Disp: 10 capsule, Rfl: 0   dexamethasone (DECADRON) 4 MG tablet, Take 2 tabs at the night before and 2 tab the morning of chemotherapy, every 3 weeks, by mouth x 6 cycles, Disp: 24 tablet, Rfl: 6   escitalopram (LEXAPRO) 10 MG tablet, Take 1 tablet (10 mg total) by mouth daily., Disp: 90 tablet, Rfl: 3   famotidine (PEPCID) 20 MG tablet, Take 20 mg by mouth 2 (two) times daily as needed for heartburn or indigestion. (Patient not taking: Reported on 01/18/2022), Disp: , Rfl:    lidocaine-prilocaine (EMLA) cream, Apply to affected area once, Disp: 30 g, Rfl: 3   ondansetron (ZOFRAN) 8 MG tablet, Take 1 tablet (8 mg total) by mouth every 8 (eight) hours as needed for nausea or vomiting. Start on the third day after chemotherapy., Disp: 30 tablet, Rfl: 1   pantoprazole (PROTONIX) 40 MG tablet, Take 1 tablet (40 mg total) by mouth 2 (two) times daily., Disp: 120 tablet, Rfl: 0   prochlorperazine (COMPAZINE) 10 MG tablet, Take 1 tablet (10 mg total) by mouth every 6 (six) hours as needed for nausea or vomiting., Disp: 30 tablet, Rfl: 1  Review of Systems: See interval. Additional ROS negative.  Physical Exam: BP (!) 143/58 (BP Location: Right Arm, Patient Position: Sitting)   Pulse 84   Temp 97.8 F (36.6 C) (Tympanic)   Resp 14   Wt 146 lb 9.6 oz (66.5 kg)   SpO2 100%   BMI 29.11 kg/m  General: Alert, oriented, no acute distress. HEENT: Normocephalic, atraumatic, sclera anicteric. Chest: Unlabored breathing on room air Abdomen: soft, nontender.  Well-healed robotic incisions.  Negative pressure dressing removed. Normal saline poured over abdominal sponge to loosen. No signs or symptoms of infection. Wound measurements include: 4 cm in height, 2 cm depth, 0.5 cm with tunnel anteriorly, Right below 2 cm on width. Negative pressure dressing reapplied without difficulty.  Assessment/Plan: Kayla Mcgrath is a 77 y.o. woman with Stage IIIC2ii uterine carcinosarcoma (serous component) who presents for follow-up and wound assessment after surgery. She is advised to continue with the negative pressure wound  therapy. Home health is to take over dressing changes on 1/17. Reportable signs and symptoms reviewed. She is advised to call for any needs. She is having an excellent response with wound shrinkage on the negative pressure therapy.  Joylene John NP Gynecologic Oncology

## 2022-01-25 ENCOUNTER — Inpatient Hospital Stay: Payer: Medicare HMO

## 2022-01-25 DIAGNOSIS — Z90710 Acquired absence of both cervix and uterus: Secondary | ICD-10-CM | POA: Diagnosis not present

## 2022-01-25 DIAGNOSIS — C775 Secondary and unspecified malignant neoplasm of intrapelvic lymph nodes: Secondary | ICD-10-CM | POA: Diagnosis not present

## 2022-01-25 DIAGNOSIS — C549 Malignant neoplasm of corpus uteri, unspecified: Secondary | ICD-10-CM | POA: Diagnosis not present

## 2022-01-25 DIAGNOSIS — N179 Acute kidney failure, unspecified: Secondary | ICD-10-CM | POA: Diagnosis not present

## 2022-01-25 LAB — CMP (CANCER CENTER ONLY)
ALT: 13 U/L (ref 0–44)
AST: 16 U/L (ref 15–41)
Albumin: 3.3 g/dL — ABNORMAL LOW (ref 3.5–5.0)
Alkaline Phosphatase: 94 U/L (ref 38–126)
Anion gap: 7 (ref 5–15)
BUN: 18 mg/dL (ref 8–23)
CO2: 27 mmol/L (ref 22–32)
Calcium: 9.1 mg/dL (ref 8.9–10.3)
Chloride: 105 mmol/L (ref 98–111)
Creatinine: 1.02 mg/dL — ABNORMAL HIGH (ref 0.44–1.00)
GFR, Estimated: 57 mL/min — ABNORMAL LOW (ref >60)
Glucose, Bld: 95 mg/dL (ref 70–99)
Potassium: 3.7 mmol/L (ref 3.5–5.1)
Sodium: 139 mmol/L (ref 135–145)
Total Bilirubin: 0.3 mg/dL (ref 0.3–1.2)
Total Protein: 7.1 g/dL (ref 6.5–8.1)

## 2022-01-25 LAB — CBC WITH DIFFERENTIAL (CANCER CENTER ONLY)
Abs Immature Granulocytes: 0.02 10*3/uL (ref 0.00–0.07)
Basophils Absolute: 0 10*3/uL (ref 0.0–0.1)
Basophils Relative: 0 %
Eosinophils Absolute: 0.1 10*3/uL (ref 0.0–0.5)
Eosinophils Relative: 1 %
HCT: 29.8 % — ABNORMAL LOW (ref 36.0–46.0)
Hemoglobin: 9.7 g/dL — ABNORMAL LOW (ref 12.0–15.0)
Immature Granulocytes: 0 %
Lymphocytes Relative: 16 %
Lymphs Abs: 1.3 10*3/uL (ref 0.7–4.0)
MCH: 29.8 pg (ref 26.0–34.0)
MCHC: 32.6 g/dL (ref 30.0–36.0)
MCV: 91.7 fL (ref 80.0–100.0)
Monocytes Absolute: 0.6 10*3/uL (ref 0.1–1.0)
Monocytes Relative: 8 %
Neutro Abs: 5.7 10*3/uL (ref 1.7–7.7)
Neutrophils Relative %: 75 %
Platelet Count: 322 10*3/uL (ref 150–400)
RBC: 3.25 MIL/uL — ABNORMAL LOW (ref 3.87–5.11)
RDW: 18.9 % — ABNORMAL HIGH (ref 11.5–15.5)
WBC Count: 7.7 10*3/uL (ref 4.0–10.5)
nRBC: 0 % (ref 0.0–0.2)

## 2022-01-25 MED ORDER — HEPARIN SOD (PORK) LOCK FLUSH 100 UNIT/ML IV SOLN
500.0000 [IU] | Freq: Once | INTRAVENOUS | Status: AC
  Administered 2022-01-25: 500 [IU]

## 2022-01-25 MED ORDER — SODIUM CHLORIDE 0.9% FLUSH
10.0000 mL | Freq: Once | INTRAVENOUS | Status: AC
  Administered 2022-01-25: 10 mL

## 2022-01-26 DIAGNOSIS — T8141XA Infection following a procedure, superficial incisional surgical site, initial encounter: Secondary | ICD-10-CM | POA: Diagnosis not present

## 2022-01-26 DIAGNOSIS — C549 Malignant neoplasm of corpus uteri, unspecified: Secondary | ICD-10-CM | POA: Diagnosis not present

## 2022-01-26 DIAGNOSIS — F4322 Adjustment disorder with anxiety: Secondary | ICD-10-CM | POA: Diagnosis not present

## 2022-01-26 DIAGNOSIS — F32A Depression, unspecified: Secondary | ICD-10-CM | POA: Diagnosis not present

## 2022-01-26 DIAGNOSIS — D630 Anemia in neoplastic disease: Secondary | ICD-10-CM | POA: Diagnosis not present

## 2022-01-26 DIAGNOSIS — E785 Hyperlipidemia, unspecified: Secondary | ICD-10-CM | POA: Diagnosis not present

## 2022-01-26 DIAGNOSIS — M1990 Unspecified osteoarthritis, unspecified site: Secondary | ICD-10-CM | POA: Diagnosis not present

## 2022-01-26 DIAGNOSIS — K5790 Diverticulosis of intestine, part unspecified, without perforation or abscess without bleeding: Secondary | ICD-10-CM | POA: Diagnosis not present

## 2022-01-26 DIAGNOSIS — F419 Anxiety disorder, unspecified: Secondary | ICD-10-CM | POA: Diagnosis not present

## 2022-01-26 LAB — CA 125: Cancer Antigen (CA) 125: 307 U/mL — ABNORMAL HIGH (ref 0.0–38.1)

## 2022-01-28 DIAGNOSIS — E785 Hyperlipidemia, unspecified: Secondary | ICD-10-CM | POA: Diagnosis not present

## 2022-01-28 DIAGNOSIS — F4322 Adjustment disorder with anxiety: Secondary | ICD-10-CM | POA: Diagnosis not present

## 2022-01-28 DIAGNOSIS — T8141XA Infection following a procedure, superficial incisional surgical site, initial encounter: Secondary | ICD-10-CM | POA: Diagnosis not present

## 2022-01-28 DIAGNOSIS — C549 Malignant neoplasm of corpus uteri, unspecified: Secondary | ICD-10-CM | POA: Diagnosis not present

## 2022-01-28 DIAGNOSIS — K5790 Diverticulosis of intestine, part unspecified, without perforation or abscess without bleeding: Secondary | ICD-10-CM | POA: Diagnosis not present

## 2022-01-28 DIAGNOSIS — F32A Depression, unspecified: Secondary | ICD-10-CM | POA: Diagnosis not present

## 2022-01-28 DIAGNOSIS — F419 Anxiety disorder, unspecified: Secondary | ICD-10-CM | POA: Diagnosis not present

## 2022-01-28 DIAGNOSIS — M1990 Unspecified osteoarthritis, unspecified site: Secondary | ICD-10-CM | POA: Diagnosis not present

## 2022-01-28 DIAGNOSIS — D630 Anemia in neoplastic disease: Secondary | ICD-10-CM | POA: Diagnosis not present

## 2022-01-31 ENCOUNTER — Encounter: Payer: Self-pay | Admitting: Hematology and Oncology

## 2022-01-31 DIAGNOSIS — C549 Malignant neoplasm of corpus uteri, unspecified: Secondary | ICD-10-CM | POA: Diagnosis not present

## 2022-01-31 DIAGNOSIS — F32A Depression, unspecified: Secondary | ICD-10-CM | POA: Diagnosis not present

## 2022-01-31 DIAGNOSIS — F4322 Adjustment disorder with anxiety: Secondary | ICD-10-CM | POA: Diagnosis not present

## 2022-01-31 DIAGNOSIS — D630 Anemia in neoplastic disease: Secondary | ICD-10-CM | POA: Diagnosis not present

## 2022-01-31 DIAGNOSIS — T8141XA Infection following a procedure, superficial incisional surgical site, initial encounter: Secondary | ICD-10-CM | POA: Diagnosis not present

## 2022-01-31 DIAGNOSIS — F419 Anxiety disorder, unspecified: Secondary | ICD-10-CM | POA: Diagnosis not present

## 2022-01-31 DIAGNOSIS — K5790 Diverticulosis of intestine, part unspecified, without perforation or abscess without bleeding: Secondary | ICD-10-CM | POA: Diagnosis not present

## 2022-01-31 DIAGNOSIS — M1990 Unspecified osteoarthritis, unspecified site: Secondary | ICD-10-CM | POA: Diagnosis not present

## 2022-01-31 DIAGNOSIS — E785 Hyperlipidemia, unspecified: Secondary | ICD-10-CM | POA: Diagnosis not present

## 2022-01-31 MED FILL — Dexamethasone Sodium Phosphate Inj 100 MG/10ML: INTRAMUSCULAR | Qty: 1 | Status: AC

## 2022-01-31 MED FILL — Fosaprepitant Dimeglumine For IV Infusion 150 MG (Base Eq): INTRAVENOUS | Qty: 5 | Status: AC

## 2022-02-01 ENCOUNTER — Inpatient Hospital Stay: Payer: Medicare HMO

## 2022-02-01 ENCOUNTER — Encounter: Payer: Self-pay | Admitting: Hematology and Oncology

## 2022-02-01 ENCOUNTER — Telehealth: Payer: Self-pay | Admitting: Oncology

## 2022-02-01 ENCOUNTER — Other Ambulatory Visit: Payer: Self-pay

## 2022-02-01 ENCOUNTER — Inpatient Hospital Stay: Payer: Medicare HMO | Admitting: Licensed Clinical Social Worker

## 2022-02-01 ENCOUNTER — Inpatient Hospital Stay (HOSPITAL_BASED_OUTPATIENT_CLINIC_OR_DEPARTMENT_OTHER): Payer: Medicare HMO | Admitting: Hematology and Oncology

## 2022-02-01 ENCOUNTER — Inpatient Hospital Stay: Payer: Medicare HMO | Admitting: Gynecologic Oncology

## 2022-02-01 VITALS — BP 144/80 | HR 96 | Temp 98.1°F | Resp 18 | Ht 59.5 in | Wt 148.6 lb

## 2022-02-01 DIAGNOSIS — Z90710 Acquired absence of both cervix and uterus: Secondary | ICD-10-CM

## 2022-02-01 DIAGNOSIS — C549 Malignant neoplasm of corpus uteri, unspecified: Secondary | ICD-10-CM | POA: Diagnosis not present

## 2022-02-01 DIAGNOSIS — C775 Secondary and unspecified malignant neoplasm of intrapelvic lymph nodes: Secondary | ICD-10-CM | POA: Diagnosis not present

## 2022-02-01 DIAGNOSIS — Z90722 Acquired absence of ovaries, bilateral: Secondary | ICD-10-CM

## 2022-02-01 DIAGNOSIS — Z5189 Encounter for other specified aftercare: Secondary | ICD-10-CM

## 2022-02-01 DIAGNOSIS — N179 Acute kidney failure, unspecified: Secondary | ICD-10-CM | POA: Diagnosis not present

## 2022-02-01 DIAGNOSIS — C55 Malignant neoplasm of uterus, part unspecified: Secondary | ICD-10-CM

## 2022-02-01 NOTE — Progress Notes (Unsigned)
   Weight 148.6 T98.1 HR 96 100 % on RA 144/80 R 18  Wound: 2 cm in length 1 cm in diameter 0.5 cm in depth

## 2022-02-01 NOTE — Progress Notes (Signed)
[place] OFFICE PROGRESS NOTE  Patient Care Team: Lafonda Mosses, MD as PCP - General (Gynecologic Oncology) Owens Loffler, MD as Consulting Physician [facility])  ASSESSMENT & PLAN:  Carcinosarcoma of body of uterus [facility]) Unfortunately, her incision site is not healed The wound VAC was helpful to help with wound healing and she has healthy tissue at the incision site but it is quite deep, measure close to an inch deep I am concerned if we start chemotherapy today, it could impact on wound healing and increased risk of infection I would defer to surgical team for recommendation moving forward I will reassess in 1 next week We would defer her treatment today  No orders of the defined types were placed in this encounter.   All questions were answered. The patient knows to call the clinic with any problems, questions or concerns. The total time spent in the appointment was 20 minutes encounter with patients including review of chart and various tests results, discussions about plan of care and coordination of care plan   Heath Lark, MD 02/01/2022 9:57 AM  INTERVAL HISTORY: Please see below for problem oriented charting. she returns for treatment follow-up and seen prior to cycle 1 of treatment She is here accompanied by her husband I have seen her with wound VAC and after wound VAC removal Surgical wound is healing well but still remain quite deep and large  REVIEW OF SYSTEMS:   Constitutional: Denies fevers, chills or abnormal weight loss Eyes: Denies blurriness of vision Ears, nose, mouth, throat, and face: Denies mucositis or sore throat Respiratory: Denies cough, dyspnea or wheezes Cardiovascular: Denies palpitation, chest discomfort or lower extremity swelling Gastrointestinal:  Denies nausea, heartburn or change in bowel habits Skin: Denies abnormal skin rashes Lymphatics: Denies new lymphadenopathy or easy bruising Neurological:Denies  numbness, tingling or new weaknesses Behavioral/Psych: Mood is stable, no new changes  All other systems were reviewed with the patient and are negative.  I have reviewed the past medical history, past surgical history, social history and family history with the patient and they are unchanged from previous note.  ALLERGIES:  is allergic to tylenol [acetaminophen] and latex.  MEDICATIONS:  Current Outpatient Medications  Medication Sig Dispense Refill   cephALEXin (KEFLEX) 500 MG capsule Take 1 capsule (500 mg total) by mouth 2 (two) times daily. 10 capsule 0   dexamethasone (DECADRON) 4 MG tablet Take 2 tabs at the night before and 2 tab the morning of chemotherapy, every 3 weeks, by mouth x 6 cycles 24 tablet 6   escitalopram (LEXAPRO) 10 MG tablet Take 1 tablet (10 mg total) by mouth daily. 90 tablet 3   famotidine (PEPCID) 20 MG tablet Take 20 mg by mouth 2 (two) times daily as needed for heartburn or indigestion. (Patient not taking: Reported on 01/18/2022)     lidocaine-prilocaine (EMLA) cream Apply to affected area once 30 g 3   ondansetron (ZOFRAN) 8 MG tablet Take 1 tablet (8 mg total) by mouth every 8 (eight) hours as needed for nausea or vomiting. Start on the third day after chemotherapy. 30 tablet 1   pantoprazole (PROTONIX) 40 MG tablet Take 1 tablet (40 mg total) by mouth 2 (two) times daily. 120 tablet 0   prochlorperazine (COMPAZINE) 10 MG tablet Take 1 tablet (10 mg total) by mouth every 6 (six) hours as needed for nausea or vomiting. 30 tablet 1   No current facility-administered medications for this visit.    SUMMARY OF ONCOLOGIC HISTORY:  Oncology History Overview Note  MMR normal MSI stable PD-L1 CPS: 1% Her2 positive P53 + carcinosarcoma   Carcinosarcoma of body of uterus ([place])  09/23/2021 Initial Diagnosis   She presented with PMB   09/28/2021 Imaging   US pelvis Enlarged uterus with fibroids. Pelvic sonogram is otherwise unremarkable.    11/11/2021 Pathology  Results   A. CERVIX, MASS, EXCISION:  - HIGH-GRADE UNDIFFERENTIATED MALIGNANCY WITH EXTENSIVE NECROSIS (SEE COMMENT).   COMMENT:  The tumor consists of mostly high-grade sarcomatoid morphology with focal epithelioid morphology.  Immunohistochemical stains are performed on block A4.  The tumor cells are positive for desmin and p16 while negative for CD45, CK7, CK20, chromogranin, synaptophysin, Melan-A, p40, AE1/AE3, SMA, p63, cyclin D1, S100 and smooth muscle myosin.  Ki-67 proliferation index is high.  CD10 staining shows focal nonspecific staining.  The differential diagnosis includes a high-grade sarcoma and a partially sampled carcinosarcoma.    11/25/2021 Imaging   MRI pelvis Marked distention of the entire endometrial cavity by heterogeneously enhancing soft tissue density, which extends through the endocervical canal into the lower vagina. This is highly suspicious for endometrial carcinoma.   Diffuse myometrial thinning due to marked distention of endometrial cavity limits evaluation; deep myometrial invasion cannot be excluded in the uterine fundus.   No evidence of extra-uterine extension of tumor.   Lymphadenopathy in lower abdominal retroperitoneum, bilateral iliac chains, and sigmoid mesocolon, consistent with metastatic disease.   Sigmoid diverticulosis. No radiographic evidence of diverticulitis.   11/26/2021 Initial Diagnosis   Uterine cancer ([place])   11/26/2021 Cancer Staging   Staging form: Corpus Uteri - Carcinoma and Carcinosarcoma, AJCC 8th Edition - Clinical stage from 11/26/2021: FIGO Stage IIIC2 (cT3, cN2, cM0) - Signed by Heath Lark, MD on 11/26/2021 Stage prefix: Initial diagnosis Histologic grade (G): G3 Histologic grading system: 3 grade system   11/30/2021 Pathology Results   FINAL MICROSCOPIC DIAGNOSIS:  A. UTERINE CONTENTS, BIOPSY: - HIGH-GRADE UNDIFFERENTIATED MALIGNANCY WITH EXTENSIVE NECROSIS (SEE COMMENT).  COMMENT: The patient's history of a  high-grade undifferentiated malignancy with extensive necrosis of the cervix is noted.  The morphological features of the current tumor cells are similar to the tumor cells seen in the previous cervical mass excision.  Immunohistochemical staining for desmin and cytokeratin AE1/AE3 is performed on block A1.  The tumor cells are diffusely positive for desmin.   AE1/AE3 stain is focally positive, however this is scant and of unclear clinical significance. Given the small quantity of tissue in the current biopsy, a definitive distinction between a high-grade sarcoma and carcinosarcoma cannot be made as this biopsy may not be representative of the entire tumor. If clinically indicated, this case as well as the previous cervical mass excision ([id number]) may be sent out for expert consultation. Clinical correlation recommended.     12/01/2021 Tumor Marker   Patient's tumor was tested for the following markers: CA-125. Results of the tumor marker test revealed 491.   12/22/2021 Surgery   Robotic-assisted laparoscopic total hysterectomy with bilateral salpingo-oophorectomy, tumor debulking including high common iliac lymph node excision, aborted attempt of bilateral external iliac enlarged lymph nodes, mini-lap for specimen delivery, cystoscopy    12/22/2021 Pathology Results   FINAL MICROSCOPIC DIAGNOSIS:  A. LEFT CERVICAL MARGIN, EXCISION: - Positive for carcinoma  B. HIGH LEFT COMMON ILIAC LYMPH NODE, EXCISION: - Metastatic carcinosarcoma involving the lymph node  C. 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, including undifferentiated sarcoma with rhabdoid features and high-grade serous  carcinoma, see comment - Tumor invades for a depth of 1 mm where myometrial thickness is 5 mm - Benign bilateral fallopian tubes and ovaries - See oncology table - See comment  ONCOLOGY TABLE:  UTERUS, CARCINOMA OR  CARCINOSARCOMA: Resection  Procedure: Total hysterectomy and bilateral salpingo-oophorectomy Histologic Type: Carcinosarcoma (mixed malignant Mullerian tumor) Histologic Grade: High-grade Myometrial Invasion:      Depth of Myometrial Invasion (mm): 1 mm      Myometrial Thickness (mm): 5 mm      Percentage of Myometrial Invasion: 20% Uterine Serosa Involvement: Not identified Cervical stromal Involvement: Present Extent of involvement of other tissue/organs: Not identified Peritoneal/Ascitic Fluid: Not applicable Lymphovascular Invasion: Present Regional Lymph Nodes:      Pelvic Lymph Nodes Examined:                                  0 Sentinel                                  1 Non-sentinel                                  1 Total      Pelvic Lymph Nodes with Metastasis: 1                          Macrometastasis: (>2.0 mm): 1                          Micrometastasis: (>0.2 mm and < 2.0 mm): 0                          Isolated Tumor Cells (<0.2 mm): 0                          Laterality of Lymph Node with Tumor: Left                          Extracapsular Extension: Not identified      Para-aortic Lymph Nodes Examined:                                   0 Sentinel                                   0 non-sentinel                                   0 total Distant Metastasis:      Distant Site(s) Involved: Not applicable Pathologic Stage Classification (pTNM, AJCC 8th Edition): pT1a, pN1 Ancillary Studies: MMR / MSI testing will be ordered Representative Tumor Block: C1 Comment(s): None  ([id number])       01/13/2022 Procedure   Placement of single lumen port a cath via right internal jugular vein. The catheter tip lies at the cavo-atrial junction. A power injectable port a cath was placed and is ready for immediate use.  01/19/2022 Echocardiogram    1. Left ventricular ejection fraction, by estimation, is 60 to 65%. The left ventricle has normal function. The left ventricle  has no regional wall motion abnormalities. Left ventricular diastolic parameters are consistent with Grade I diastolic dysfunction (impaired relaxation). The average left ventricular global longitudinal strain is -24.0 %. The global longitudinal strain is normal.  2. Right ventricular systolic function is normal. The right ventricular size is normal. There is normal pulmonary artery systolic pressure.  3. Left atrial size was mildly dilated.  4. The mitral valve is normal in structure. No evidence of mitral valve regurgitation. No evidence of mitral stenosis.  5. The aortic valve was not well visualized. Aortic valve regurgitation is not visualized. Aortic valve sclerosis is present, with no evidence of aortic valve stenosis.  6. The inferior vena cava is normal in size with greater than 50% respiratory variability, suggesting right atrial pressure of 3 mmHg.     01/27/2022 Tumor Marker   Patient's tumor was tested for the following markers: CA-125. Results of the tumor marker test revealed 307.   03/14/2022 -  Chemotherapy   Patient is on Treatment Plan : UTERINE SEROUS CARCINOMA Carboplatin + Paclitaxel + Trastuzumab q21d x 6 Cycles / Trastuzumab q21d       PHYSICAL EXAMINATION: ECOG PERFORMANCE STATUS: 0 - Asymptomatic  Vitals:   02/01/22 0805  BP: (!) 144/80  Pulse: 96  Resp: 18  Temp: 98.1 F (36.7 C)  SpO2: 100%   Filed Weights   02/01/22 0805  Weight: 148 lb 9.6 oz (67.4 kg)    GENERAL:alert, no distress and comfortable SKIN: The site of her surgical incision is healing well but not completely healed, measures approximately 2 inches long, 1 inch deep NEURO: alert & oriented x 3 with fluent speech, no focal motor/sensory deficits  LABORATORY DATA:  I have reviewed the data as listed    Component Value Date/Time   NA 139 01/25/2022 1130   NA 140 11/02/2016 1130   K 3.7 01/25/2022 1130   K 3.8 11/02/2016 1130   CL 105 01/25/2022 1130   CL 105 05/02/2012 0822   CO2 27  01/25/2022 1130   CO2 26 11/02/2016 1130   GLUCOSE 95 01/25/2022 1130   GLUCOSE 87 11/02/2016 1130   GLUCOSE 98 05/02/2012 0822   BUN 18 01/25/2022 1130   BUN 14.7 11/02/2016 1130   CREATININE 1.02 (H) 01/25/2022 1130   CREATININE 0.84 12/05/2018 1014   CREATININE 0.8 11/02/2016 1130   CALCIUM 9.1 01/25/2022 1130   CALCIUM 9.7 11/02/2016 1130   PROT 7.1 01/25/2022 1130   PROT 6.8 11/02/2016 1130   ALBUMIN 3.3 (L) 01/25/2022 1130   ALBUMIN 3.6 11/02/2016 1130   AST 16 01/25/2022 1130   AST 23 11/02/2016 1130   ALT 13 01/25/2022 1130   ALT 17 11/02/2016 1130   ALKPHOS 94 01/25/2022 1130   ALKPHOS 59 11/02/2016 1130   BILITOT 0.3 01/25/2022 1130   BILITOT 0.34 11/02/2016 1130   GFRNONAA 57 (L) 01/25/2022 1130   GFRAA >60 11/08/2017 0504   GFRAA >60 07/25/2017 1425    No results found for: "SPEP", "UPEP"  Lab Results  Component Value Date   WBC 7.7 01/25/2022   NEUTROABS 5.7 01/25/2022   HGB 9.7 (L) 01/25/2022   HCT 29.8 (L) 01/25/2022   MCV 91.7 01/25/2022   PLT 322 01/25/2022      Chemistry      Component Value Date/Time   NA 139 01/25/2022 1130  NA 140 11/02/2016 1130   K 3.7 01/25/2022 1130   K 3.8 11/02/2016 1130   CL 105 01/25/2022 1130   CL 105 05/02/2012 0822   CO2 27 01/25/2022 1130   CO2 26 11/02/2016 1130   BUN 18 01/25/2022 1130   BUN 14.7 11/02/2016 1130   CREATININE 1.02 (H) 01/25/2022 1130   CREATININE 0.84 12/05/2018 1014   CREATININE 0.8 11/02/2016 1130      Component Value Date/Time   CALCIUM 9.1 01/25/2022 1130   CALCIUM 9.7 11/02/2016 1130   ALKPHOS 94 01/25/2022 1130   ALKPHOS 59 11/02/2016 1130   AST 16 01/25/2022 1130   AST 23 11/02/2016 1130   ALT 13 01/25/2022 1130   ALT 17 11/02/2016 1130   BILITOT 0.3 01/25/2022 1130   BILITOT 0.34 11/02/2016 1130

## 2022-02-01 NOTE — Assessment & Plan Note (Signed)
Unfortunately, her incision site is not healed The wound VAC was helpful to help with wound healing and she has healthy tissue at the incision site but it is quite deep, measure close to an inch deep I am concerned if we start chemotherapy today, it could impact on wound healing and increased risk of infection I would defer to surgical team for recommendation moving forward I will reassess in 1 next week We would defer her treatment today

## 2022-02-01 NOTE — Telephone Encounter (Signed)
Left a message for [facility] that wound vac has been removed and that Kayla Mcgrath will not need home health now.  Also called Kayla Mcgrath with [employer] who will arrange for pick up of the wound vac supplies from [place] today.  Called Kayla Mcgrath and let her know that [employer] will try to pick up the wound vac supplies today.

## 2022-02-01 NOTE — Patient Instructions (Signed)
Plan to place a moistened 2x2 gauze in the wound opening at least once a day. You no longer need the negative pressure dressing.   We will plan on seeing you in the office next Monday to check the wound. Please call for any needs or concerns or new symptoms.

## 2022-02-02 ENCOUNTER — Encounter: Payer: Self-pay | Admitting: Hematology and Oncology

## 2022-02-04 DIAGNOSIS — T8141XA Infection following a procedure, superficial incisional surgical site, initial encounter: Secondary | ICD-10-CM | POA: Diagnosis not present

## 2022-02-04 DIAGNOSIS — E785 Hyperlipidemia, unspecified: Secondary | ICD-10-CM | POA: Diagnosis not present

## 2022-02-04 DIAGNOSIS — M1990 Unspecified osteoarthritis, unspecified site: Secondary | ICD-10-CM | POA: Diagnosis not present

## 2022-02-04 DIAGNOSIS — K5790 Diverticulosis of intestine, part unspecified, without perforation or abscess without bleeding: Secondary | ICD-10-CM | POA: Diagnosis not present

## 2022-02-04 DIAGNOSIS — F4322 Adjustment disorder with anxiety: Secondary | ICD-10-CM | POA: Diagnosis not present

## 2022-02-04 DIAGNOSIS — C549 Malignant neoplasm of corpus uteri, unspecified: Secondary | ICD-10-CM | POA: Diagnosis not present

## 2022-02-04 DIAGNOSIS — D630 Anemia in neoplastic disease: Secondary | ICD-10-CM | POA: Diagnosis not present

## 2022-02-04 DIAGNOSIS — F419 Anxiety disorder, unspecified: Secondary | ICD-10-CM | POA: Diagnosis not present

## 2022-02-04 DIAGNOSIS — F32A Depression, unspecified: Secondary | ICD-10-CM | POA: Diagnosis not present

## 2022-02-07 ENCOUNTER — Telehealth: Payer: Self-pay | Admitting: Hematology and Oncology

## 2022-02-07 ENCOUNTER — Other Ambulatory Visit: Payer: Self-pay | Admitting: Hematology and Oncology

## 2022-02-07 ENCOUNTER — Other Ambulatory Visit: Payer: Self-pay

## 2022-02-07 ENCOUNTER — Inpatient Hospital Stay (HOSPITAL_BASED_OUTPATIENT_CLINIC_OR_DEPARTMENT_OTHER): Payer: Medicare HMO | Admitting: Gynecologic Oncology

## 2022-02-07 VITALS — BP 145/73 | HR 95 | Temp 98.3°F | Resp 14 | Wt 145.0 lb

## 2022-02-07 DIAGNOSIS — C549 Malignant neoplasm of corpus uteri, unspecified: Secondary | ICD-10-CM

## 2022-02-07 DIAGNOSIS — C55 Malignant neoplasm of uterus, part unspecified: Secondary | ICD-10-CM

## 2022-02-07 DIAGNOSIS — C541 Malignant neoplasm of endometrium: Secondary | ICD-10-CM

## 2022-02-07 DIAGNOSIS — Z90710 Acquired absence of both cervix and uterus: Secondary | ICD-10-CM

## 2022-02-07 DIAGNOSIS — T8149XA Infection following a procedure, other surgical site, initial encounter: Secondary | ICD-10-CM

## 2022-02-07 DIAGNOSIS — Z90722 Acquired absence of ovaries, bilateral: Secondary | ICD-10-CM

## 2022-02-07 DIAGNOSIS — Z5189 Encounter for other specified aftercare: Secondary | ICD-10-CM

## 2022-02-07 NOTE — Telephone Encounter (Signed)
Scheduled appointment per scheduling message. Left voicemail.

## 2022-02-07 NOTE — Patient Instructions (Signed)
Plan to apply hydrocortisone cream to the red bumps, itchy area around the incision. Let the area air dry as much as possible.   You do not need to apply a thick dressing with tape. You can use a maxi pad or gauze between your clothing and or pants if rubbing.   Dr. Alvy Bimler will plan to have the schedulers arrange for chemo next week.  Call if the redness does not improve around your wound.

## 2022-02-07 NOTE — Progress Notes (Signed)
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 Pelvic sonogram is otherwise unremarkable.    11/11/2021 Pathology Results   A. CERVIX, MASS, EXCISION:  - HIGH-GRADE UNDIFFERENTIATED MALIGNANCY WITH EXTENSIVE NECROSIS (SEE COMMENT).   COMMENT:  The tumor consists of mostly high-grade sarcomatoid morphology with focal epithelioid morphology.  Immunohistochemical stains are performed on block A4.  The tumor cells are positive for desmin and p16 while negative for CD45, CK7, CK20, chromogranin, synaptophysin, Melan-A, p40, AE1/AE3, SMA, p63, cyclin D1, S100 and smooth muscle myosin.  Ki-67 proliferation index is high.  CD10 staining shows focal nonspecific staining.  The differential diagnosis includes a high-grade sarcoma and a partially sampled carcinosarcoma.    11/25/2021 Imaging   MRI pelvis Marked distention of the entire endometrial cavity by heterogeneously enhancing soft tissue density, which extends through the endocervical canal into the lower vagina. This is highly suspicious for endometrial carcinoma.   Diffuse myometrial thinning due to marked distention of endometrial cavity limits evaluation; deep myometrial invasion cannot be excluded in the uterine fundus.   No evidence of extra-uterine extension of tumor.   Lymphadenopathy in lower abdominal retroperitoneum, bilateral iliac chains, and sigmoid mesocolon, consistent with metastatic disease.   Sigmoid diverticulosis. No radiographic evidence of diverticulitis.   11/26/2021 Initial Diagnosis   Uterine cancer ([place])   11/26/2021 Cancer Staging   Staging form:  Corpus Uteri - Carcinoma and Carcinosarcoma, AJCC 8th Edition - Clinical stage from 11/26/2021: FIGO Stage IIIC2 (cT3, cN2, cM0) - Signed by Heath Lark, MD on 11/26/2021 Stage prefix: Initial diagnosis Histologic grade (G): G3 Histologic grading system: 3 grade system   11/30/2021 Pathology Results   FINAL MICROSCOPIC DIAGNOSIS:  A. UTERINE CONTENTS, BIOPSY: - HIGH-GRADE UNDIFFERENTIATED MALIGNANCY WITH EXTENSIVE NECROSIS (SEE COMMENT).  COMMENT: The patient's history of a high-grade undifferentiated malignancy with extensive necrosis of the cervix is noted.  The morphological features of the current tumor cells are similar to the tumor cells seen in the previous cervical mass excision.  Immunohistochemical staining for desmin and cytokeratin AE1/AE3 is performed on block A1.  The tumor cells are diffusely positive for desmin.   AE1/AE3 stain is focally positive, however this is scant and of unclear clinical significance. Given the small quantity of tissue in the current biopsy, a definitive distinction between a high-grade sarcoma and carcinosarcoma cannot be made as this biopsy may not be representative of the entire tumor. If clinically indicated, this case as well as the previous cervical mass excision ([id number]) may be sent out for expert consultation. Clinical correlation recommended.     12/01/2021 Tumor Marker   Patient's tumor was tested for the following markers: CA-125. Results of the tumor marker test revealed 491.   12/22/2021 Surgery   Robotic-assisted laparoscopic total hysterectomy with bilateral salpingo-oophorectomy, tumor debulking including high common iliac lymph node excision, aborted attempt of bilateral external iliac enlarged lymph nodes, mini-lap for specimen delivery, cystoscopy    12/22/2021 Pathology Results   FINAL MICROSCOPIC DIAGNOSIS:  A. LEFT CERVICAL MARGIN, EXCISION: - Positive for carcinoma  B. HIGH LEFT COMMON ILIAC LYMPH NODE, EXCISION: -  Metastatic carcinosarcoma involving the lymph node  C. UTERUS, CERVIX, BILATERAL  FALLOPIAN TUBES AND OVARIES, RESECTION: - Carcinosarcoma (Mixed Malignant Mullerian Tumor), 13.2 cm, including undifferentiated sarcoma with rhabdoid features and high-grade serous carcinoma, see comment - Tumor invades for a depth of 1 mm where myometrial thickness is 5 mm - Benign bilateral fallopian tubes and ovaries - See oncology table - See comment  ONCOLOGY TABLE:  UTERUS, CARCINOMA OR CARCINOSARCOMA: Resection  Procedure: Total hysterectomy and bilateral salpingo-oophorectomy Histologic Type: Carcinosarcoma (mixed malignant Mullerian tumor) Histologic Grade: High-grade Myometrial Invasion:      Depth of Myometrial Invasion (mm): 1 mm      Myometrial Thickness (mm): 5 mm      Percentage of Myometrial Invasion: 20% Uterine Serosa Involvement: Not identified Cervical stromal Involvement: Present Extent of involvement of other tissue/organs: Not identified Peritoneal/Ascitic Fluid: Not applicable Lymphovascular Invasion: Present Regional Lymph Nodes:      Pelvic Lymph Nodes Examined:                                  0 Sentinel                                  1 Non-sentinel                                  1 Total      Pelvic Lymph Nodes with Metastasis: 1                          Macrometastasis: (>2.0 mm): 1                          Micrometastasis: (>0.2 mm and < 2.0 mm): 0                          Isolated Tumor Cells (<0.2 mm): 0                          Laterality of Lymph Node with Tumor: Left                          Extracapsular Extension: Not identified      Para-aortic Lymph Nodes Examined:                                   0 Sentinel                                   0 non-sentinel                                   0 total Distant Metastasis:      Distant Site(s) Involved: Not applicable Pathologic Stage Classification (pTNM, AJCC 8th Edition): pT1a, pN1 Ancillary Studies: MMR  / MSI testing will be ordered Representative Tumor Block: C1 Comment(s): None  ([id number])       01/13/2022 Procedure   Placement of single lumen port a cath via right internal jugular vein. The catheter  tip lies at the cavo-atrial junction. A power injectable port a cath was placed and is ready for immediate use.     01/19/2022 Echocardiogram    1. Left ventricular ejection fraction, by estimation, is 60 to 65%. The left ventricle has normal function. The left ventricle has no regional wall motion abnormalities. Left ventricular diastolic parameters are consistent with Grade I diastolic dysfunction (impaired relaxation). The average left ventricular global longitudinal strain is -24.0 %. The global longitudinal strain is normal.  2. Right ventricular systolic function is normal. The right ventricular size is normal. There is normal pulmonary artery systolic pressure.  3. Left atrial size was mildly dilated.  4. The mitral valve is normal in structure. No evidence of mitral valve regurgitation. No evidence of mitral stenosis.  5. The aortic valve was not well visualized. Aortic valve regurgitation is not visualized. Aortic valve sclerosis is present, with no evidence of aortic valve stenosis.  6. The inferior vena cava is normal in size with greater than 50% respiratory variability, suggesting right atrial pressure of 3 mmHg.     01/27/2022 Tumor Marker   Patient's tumor was tested for the following markers: CA-125. Results of the tumor marker test revealed 307.   02/14/2022 -  Chemotherapy   Patient is on Treatment Plan : UTERINE SEROUS CARCINOMA Carboplatin + Paclitaxel + Trastuzumab q21d x 6 Cycles / Trastuzumab q21d      On 01/12/22, she underwent: 1.  Incision and drainage of abdominal wall/wound infection abscess. 2.  Debridement of abdominal wall/wound fibrinous exudate, both subcutaneous and fascia.  She was initially sent home on clindamycin and changed to keflex based on culture  results.  Wound culture returned with: RARE ESCHERICHIA COLI , ABUNDANT BACTEROIDES THETAIOTAOMICRON , BETA LACTAMASE POSITIVE   Interval History: The patient reports doing well since last office visit.  She denies fever or chills. She denies pain. She reports having mild challenges with changing the dressing herself since she is usually standing and the gauze falls out. She has been using tegaderm dressing and dry gauze dressings with hypofix tape. She has noticed itching around the wound. She is able to do daily activities. She cut her hair short in anticipation for upcoming chemo. Continues to endorse normal bowel and bladder function.  Denies any vaginal bleeding. Tolerating p.o. without nausea or emesis. All questions answered.   Past Medical/Surgical History: Past Medical History:  Diagnosis Date   Adjustment disorder with anxious mood 12/05/2018   Anemia    Anxiety    Arthralgia 06/29/2015   Arthritis    Breast cancer ([place]) 2010   Colon polyps    ?   Complication of anesthesia    DCIS (ductal carcinoma in situ) of breast 2010   Depression    Diverticulosis    Full dentures    Hyperlipidemia    Joint pain    Leukocytosis 07/11/2017   PONV (postoperative nausea and vomiting)    Woke up during surgery   Skin cancer    SCC   Wears glasses     Past Surgical History:  Procedure Laterality Date   BREAST BIOPSY Right 05/11/2012   BREAST BIOPSY Right 04/24/2012   BREAST LUMPECTOMY Right 12/2008   cancer-tamoxifen   BREAST RECONSTRUCTION WITH PLACEMENT OF TISSUE EXPANDER AND FLEX HD (ACELLULAR HYDRATED DERMIS) Right 06/20/2012   Procedure: BREAST RECONSTRUCTION WITH PLACEMENT OF TISSUE EXPANDER AND FLEX HD (ACELLULAR HYDRATED DERMIS);  Surgeon: Theodoro Kos, DO;  Location: [facility];  Service: Plastics;  Laterality: Right;   BREAST REDUCTION WITH MASTOPEXY Left 10/11/2012   Procedure: LEFT BREAST REDUCTION WITH MASTOPEXY;  Surgeon: Theodoro Kos, DO;   Location: [facility];  Service: Plastics;  Laterality: Left;   cataract sugery Bilateral 03/2019   COLONOSCOPY  08/28/2013   [facility]   DILATION AND CURETTAGE OF UTERUS     heavy bleeding between two pregnancies   IR IMAGING GUIDED PORT INSERTION  01/12/2022   LAPAROTOMY N/A 01/12/2022   Procedure: incision drainage of abdominal incision wound;  Surgeon: Lahoma Crocker, MD;  Location: [facility];  Service: Gynecology;  Laterality: N/A;   LIPOSUCTION WITH LIPOFILLING Left 10/11/2012   Procedure: LIPOSUCTION WITH LIPOFILLING;  Surgeon: Theodoro Kos, DO;  Location: [facility];  Service: Plastics;  Laterality: Left;   LYMPH NODE DISSECTION N/A 12/22/2021   Procedure: LYMPH NODE DISSECTION;  Surgeon: Lafonda Mosses, MD;  Location: [facility];  Service: Gynecology;  Laterality: N/A;   MASTECTOMY Right 06/20/2012   MASTECTOMY W/ SENTINEL NODE BIOPSY Right 06/20/2012   Procedure: right skin sparing MASTECTOMY WITH SENTINEL LYMPH NODE BIOPSY;  Surgeon: Stark Klein, MD;  Location: [facility];  Service: General;  Laterality: Right;   PARATHYROIDECTOMY Left 11/07/2017   Procedure: NECK EXPLORATION WITH LEFT THYROID LOBECTOMY;  Surgeon: [name], MD;  Location: [facility];  Service: General;  Laterality: Left;   POLYPECTOMY     REDUCTION MAMMAPLASTY Left 10/2012   REMOVAL OF TISSUE EXPANDER AND PLACEMENT OF IMPLANT Right 10/11/2012   Procedure: REMOVAL OF TISSUE EXPANDER AND PLACEMENT OF SILICONE IMPLANT RIGHT;  Surgeon: Theodoro Kos, DO;  Location: [facility];  Service: Plastics;  Laterality: Right;    Family History  Problem Relation Age of Onset   Alcohol abuse Mother    Colon polyps Mother    Mental illness Mother    Alcohol abuse Father        suicide   Mental illness Father    Cancer Sister        gyn cancer   Heart disease Paternal Grandfather    Colon cancer Neg Hx    Esophageal cancer Neg Hx    Rectal cancer Neg Hx     Stomach cancer Neg Hx    Breast cancer Neg Hx    Ovarian cancer Neg Hx    Endometrial cancer Neg Hx    Pancreatic cancer Neg Hx    Prostate cancer Neg Hx     Social History   Socioeconomic History   Marital status: Married    Spouse name: Not on file   Number of children: 2   Years of education: Not on file   Highest education level: Not on file  Occupational History   Occupation: retired LPN  Tobacco Use   Smoking status: Former    Types: Cigarettes    Quit date: 06/14/1981    Years since quitting: 40.6   Smokeless tobacco: Never  Vaping Use   Vaping Use: Never used  Substance and Sexual Activity   Alcohol use: No   Drug use: No   Sexual activity: Not Currently    Birth control/protection: Post-menopausal  Other Topics Concern   Not on file  Social History Narrative   Recently moved from [place] to be closer to her two daughters and grandchildren.   Retired Corporate treasurer.   DNR- forms singed on 05/07/2012 and returned to pt.   Social Determinants of Health   Financial Resource Strain: Low Risk  (02/02/2021)   Overall  Financial Resource Strain (CARDIA)    Difficulty of Paying Living Expenses: Not hard at all  Food Insecurity: No Food Insecurity (11/30/2021)   Hunger Vital Sign    Worried About Running Out of Food in the Last Year: Never true    Ran Out of Food in the Last Year: Never true  Transportation Needs: No Transportation Needs (11/30/2021)   PRAPARE - Hydrologist (Medical): No    Lack of Transportation (Non-Medical): No  Physical Activity: Insufficiently Active (02/02/2021)   Exercise Vital Sign    Days of Exercise per Week: 3 days    Minutes of Exercise per Session: 30 min  Stress: No Stress Concern Present (02/02/2021)   [place]    Feeling of Stress : Not at all  Social Connections: Moderately Integrated (02/02/2021)   Social Connection and Isolation Panel [NHANES]     Frequency of Communication with Friends and Family: Twice a week    Frequency of Social Gatherings with Friends and Family: Twice a week    Attends Religious Services: Never    Marine scientist or Organizations: Yes    Attends Music therapist: More than 4 times per year    Marital Status: Married    Current Medications:  Current Outpatient Medications:    dexamethasone (DECADRON) 4 MG tablet, Take 2 tabs at the night before and 2 tab the morning of chemotherapy, every 3 weeks, by mouth x 6 cycles, Disp: 24 tablet, Rfl: 6   escitalopram (LEXAPRO) 10 MG tablet, Take 1 tablet (10 mg total) by mouth daily., Disp: 90 tablet, Rfl: 3   lidocaine-prilocaine (EMLA) cream, Apply to affected area once, Disp: 30 g, Rfl: 3   ondansetron (ZOFRAN) 8 MG tablet, Take 1 tablet (8 mg total) by mouth every 8 (eight) hours as needed for nausea or vomiting. Start on the third day after chemotherapy., Disp: 30 tablet, Rfl: 1   pantoprazole (PROTONIX) 40 MG tablet, Take 1 tablet (40 mg total) by mouth 2 (two) times daily., Disp: 120 tablet, Rfl: 0   prochlorperazine (COMPAZINE) 10 MG tablet, Take 1 tablet (10 mg total) by mouth every 6 (six) hours as needed for nausea or vomiting., Disp: 30 tablet, Rfl: 1  Review of Systems: See interval. Additional ROS negative.  Physical Exam: Today's Vitals   02/07/22 1300  BP: (!) 145/73  Pulse: 95  Resp: 14  Temp: 98.3 F (36.8 C)  TempSrc: Oral  SpO2: 99%  Weight: 145 lb (65.8 kg)   Body mass index is 28.8 kg/m.  General: Alert, oriented, no acute distress. HEENT: Normocephalic, atraumatic, sclera anicteric. Chest: Unlabored breathing on room air Abdomen: soft, nontender.  Well-healed robotic incisions. Dressing removed. No signs or symptoms of infection. Surrounding erythema from irritation from moisture, reactive. Wound is shallow with one small 0.5 cm area of depth on the distal aspect of the wound. Wound measurements include: 2 cm in  length,0.5 cm depth, 0.2 cm in diameter, no tunneling present. Wound pink/red with no exudate. Dr. Alvy Bimler assessed wound as well.   Assessment/Plan: Kayla Mcgrath is a 77 y.o. woman with Stage IIIC2ii uterine carcinosarcoma (serous component) who presents for follow-up and wound assessment after surgery. Advised to apply hydrocortisone cream to the rash-like area around the wound. Advised to leave wound open to air when able and avoid use of occlusive dressings. Per Dr. Alvy Bimler, plan for chemotherapy to start next week. Reportable signs and  symptoms reviewed. She is advised to call for any needs.     Joylene John NP Gynecologic Oncology

## 2022-02-08 NOTE — Progress Notes (Signed)
Pharmacist Chemotherapy Monitoring - Initial Assessment    Anticipated start date: 02/14/22   The following has been reviewed per standard work regarding the patient's treatment regimen: The patient's diagnosis, treatment plan and drug doses, and organ/hematologic function Lab orders and baseline tests specific to treatment regimen  The treatment plan start date, drug sequencing, and pre-medications Prior authorization status  Patient's documented medication list, including drug-drug interaction screen and prescriptions for anti-emetics and supportive care specific to the treatment regimen The drug concentrations, fluid compatibility, administration routes, and timing of the medications to be used The patient's access for treatment and lifetime cumulative dose history, if applicable  The patient's medication allergies and previous infusion related reactions, if applicable   Changes made to treatment plan:  N/A  Follow up needed:  N/A   Kennith Center, Pharm.D., CPP 02/08/2022'@3'$ :59 PM

## 2022-02-10 ENCOUNTER — Other Ambulatory Visit: Payer: Medicare HMO

## 2022-02-10 ENCOUNTER — Encounter: Payer: Self-pay | Admitting: Hematology and Oncology

## 2022-02-10 ENCOUNTER — Telehealth: Payer: Self-pay

## 2022-02-10 ENCOUNTER — Encounter: Payer: Medicare HMO | Admitting: Genetic Counselor

## 2022-02-10 NOTE — Telephone Encounter (Signed)
Checking on results from EGD on 01/07/22

## 2022-02-11 MED FILL — Fosaprepitant Dimeglumine For IV Infusion 150 MG (Base Eq): INTRAVENOUS | Qty: 5 | Status: AC

## 2022-02-11 MED FILL — Dexamethasone Sodium Phosphate Inj 100 MG/10ML: INTRAMUSCULAR | Qty: 1 | Status: AC

## 2022-02-14 ENCOUNTER — Encounter: Payer: Self-pay | Admitting: Nurse Practitioner

## 2022-02-14 ENCOUNTER — Encounter: Payer: Self-pay | Admitting: Hematology and Oncology

## 2022-02-14 ENCOUNTER — Inpatient Hospital Stay: Payer: Medicare HMO

## 2022-02-14 ENCOUNTER — Other Ambulatory Visit: Payer: Self-pay

## 2022-02-14 ENCOUNTER — Inpatient Hospital Stay: Payer: Medicare HMO | Attending: Hematology and Oncology

## 2022-02-14 ENCOUNTER — Inpatient Hospital Stay: Payer: Medicare HMO | Admitting: Hematology and Oncology

## 2022-02-14 VITALS — BP 151/81 | HR 77 | Temp 99.1°F | Resp 17 | Wt 146.5 lb

## 2022-02-14 DIAGNOSIS — Z5111 Encounter for antineoplastic chemotherapy: Secondary | ICD-10-CM | POA: Insufficient documentation

## 2022-02-14 DIAGNOSIS — T148XXD Other injury of unspecified body region, subsequent encounter: Secondary | ICD-10-CM | POA: Diagnosis not present

## 2022-02-14 DIAGNOSIS — N183 Chronic kidney disease, stage 3 unspecified: Secondary | ICD-10-CM | POA: Diagnosis not present

## 2022-02-14 DIAGNOSIS — C775 Secondary and unspecified malignant neoplasm of intrapelvic lymph nodes: Secondary | ICD-10-CM | POA: Diagnosis not present

## 2022-02-14 DIAGNOSIS — Z5112 Encounter for antineoplastic immunotherapy: Secondary | ICD-10-CM | POA: Diagnosis not present

## 2022-02-14 DIAGNOSIS — D631 Anemia in chronic kidney disease: Secondary | ICD-10-CM | POA: Insufficient documentation

## 2022-02-14 DIAGNOSIS — N189 Chronic kidney disease, unspecified: Secondary | ICD-10-CM | POA: Diagnosis not present

## 2022-02-14 DIAGNOSIS — C549 Malignant neoplasm of corpus uteri, unspecified: Secondary | ICD-10-CM

## 2022-02-14 LAB — CMP (CANCER CENTER ONLY)
ALT: 14 U/L (ref 0–44)
AST: 17 U/L (ref 15–41)
Albumin: 3.6 g/dL (ref 3.5–5.0)
Alkaline Phosphatase: 89 U/L (ref 38–126)
Anion gap: 10 (ref 5–15)
BUN: 27 mg/dL — ABNORMAL HIGH (ref 8–23)
CO2: 23 mmol/L (ref 22–32)
Calcium: 9.4 mg/dL (ref 8.9–10.3)
Chloride: 101 mmol/L (ref 98–111)
Creatinine: 1.03 mg/dL — ABNORMAL HIGH (ref 0.44–1.00)
GFR, Estimated: 57 mL/min — ABNORMAL LOW (ref >60)
Glucose, Bld: 151 mg/dL — ABNORMAL HIGH (ref 70–99)
Potassium: 3.9 mmol/L (ref 3.5–5.1)
Sodium: 134 mmol/L — ABNORMAL LOW (ref 135–145)
Total Bilirubin: 0.3 mg/dL (ref 0.3–1.2)
Total Protein: 7.2 g/dL (ref 6.5–8.1)

## 2022-02-14 LAB — CBC WITH DIFFERENTIAL (CANCER CENTER ONLY)
Abs Immature Granulocytes: 0.01 10*3/uL (ref 0.00–0.07)
Basophils Absolute: 0 10*3/uL (ref 0.0–0.1)
Basophils Relative: 0 %
Eosinophils Absolute: 0 10*3/uL (ref 0.0–0.5)
Eosinophils Relative: 0 %
HCT: 33.6 % — ABNORMAL LOW (ref 36.0–46.0)
Hemoglobin: 11.1 g/dL — ABNORMAL LOW (ref 12.0–15.0)
Immature Granulocytes: 0 %
Lymphocytes Relative: 13 %
Lymphs Abs: 0.5 10*3/uL — ABNORMAL LOW (ref 0.7–4.0)
MCH: 30.2 pg (ref 26.0–34.0)
MCHC: 33 g/dL (ref 30.0–36.0)
MCV: 91.3 fL (ref 80.0–100.0)
Monocytes Absolute: 0.1 10*3/uL (ref 0.1–1.0)
Monocytes Relative: 1 %
Neutro Abs: 3.5 10*3/uL (ref 1.7–7.7)
Neutrophils Relative %: 86 %
Platelet Count: 286 10*3/uL (ref 150–400)
RBC: 3.68 MIL/uL — ABNORMAL LOW (ref 3.87–5.11)
RDW: 15.9 % — ABNORMAL HIGH (ref 11.5–15.5)
WBC Count: 4.1 10*3/uL (ref 4.0–10.5)
nRBC: 0 % (ref 0.0–0.2)

## 2022-02-14 MED ORDER — TRASTUZUMAB-ANNS CHEMO 150 MG IV SOLR
8.0000 mg/kg | Freq: Once | INTRAVENOUS | Status: AC
  Administered 2022-02-14: 525 mg via INTRAVENOUS
  Filled 2022-02-14: qty 25

## 2022-02-14 MED ORDER — SODIUM CHLORIDE 0.9 % IV SOLN
10.0000 mg | Freq: Once | INTRAVENOUS | Status: AC
  Administered 2022-02-14: 10 mg via INTRAVENOUS
  Filled 2022-02-14: qty 10
  Filled 2022-02-14: qty 1

## 2022-02-14 MED ORDER — SODIUM CHLORIDE 0.9 % IV SOLN
150.0000 mg | Freq: Once | INTRAVENOUS | Status: AC
  Administered 2022-02-14: 150 mg via INTRAVENOUS
  Filled 2022-02-14: qty 150
  Filled 2022-02-14: qty 5

## 2022-02-14 MED ORDER — SODIUM CHLORIDE 0.9% FLUSH
10.0000 mL | INTRAVENOUS | Status: DC | PRN
  Administered 2022-02-14: 10 mL

## 2022-02-14 MED ORDER — FAMOTIDINE IN NACL 20-0.9 MG/50ML-% IV SOLN
20.0000 mg | Freq: Once | INTRAVENOUS | Status: AC
  Administered 2022-02-14: 20 mg via INTRAVENOUS
  Filled 2022-02-14: qty 50

## 2022-02-14 MED ORDER — SODIUM CHLORIDE 0.9 % IV SOLN: Freq: Once | INTRAVENOUS | Status: AC

## 2022-02-14 MED ORDER — PALONOSETRON HCL INJECTION 0.25 MG/5ML
0.2500 mg | Freq: Once | INTRAVENOUS | Status: AC
  Administered 2022-02-14: 0.25 mg via INTRAVENOUS
  Filled 2022-02-14: qty 5

## 2022-02-14 MED ORDER — SODIUM CHLORIDE 0.9 % IV SOLN
372.5000 mg | Freq: Once | INTRAVENOUS | Status: AC
  Administered 2022-02-14: 350 mg via INTRAVENOUS
  Filled 2022-02-14: qty 34.74

## 2022-02-14 MED ORDER — CETIRIZINE HCL 10 MG/ML IV SOLN
10.0000 mg | Freq: Once | INTRAVENOUS | Status: AC
  Administered 2022-02-14: 10 mg via INTRAVENOUS
  Filled 2022-02-14: qty 1

## 2022-02-14 MED ORDER — HEPARIN SOD (PORK) LOCK FLUSH 100 UNIT/ML IV SOLN
500.0000 [IU] | Freq: Once | INTRAVENOUS | Status: AC | PRN
  Administered 2022-02-14: 500 [IU]

## 2022-02-14 MED ORDER — SODIUM CHLORIDE 0.9 % IV SOLN
175.0000 mg/m2 | Freq: Once | INTRAVENOUS | Status: AC
  Administered 2022-02-14: 288 mg via INTRAVENOUS
  Filled 2022-02-14: qty 48

## 2022-02-14 MED ORDER — SODIUM CHLORIDE 0.9% FLUSH
10.0000 mL | Freq: Once | INTRAVENOUS | Status: AC
  Administered 2022-02-14: 10 mL

## 2022-02-14 NOTE — Assessment & Plan Note (Signed)
Her blood count is much improved She is not symptomatic Observe only 

## 2022-02-14 NOTE — Progress Notes (Signed)
[place] OFFICE PROGRESS NOTE  Patient Care Team: Lafonda Mosses, MD as PCP - General (Gynecologic Oncology) Owens Loffler, MD as Consulting Physician [facility] Medicine)  ASSESSMENT & PLAN:  Carcinosarcoma of body of uterus [facility]) Her abdominal wound is almost completely healed We will start treatment as scheduled  Delayed wound healing This is improving We will observe closely She could be at risk of dehiscence with treatment  Anemia in chronic kidney disease Her blood count is much improved She is not symptomatic Observe only  No orders of the defined types were placed in this encounter.   All questions were answered. The patient knows to call the clinic with any problems, questions or concerns. The total time spent in the appointment was 20 minutes encounter with patients including review of chart and various tests results, discussions about plan of care and coordination of care plan   Heath Lark, MD 02/14/2022 11:12 AM  INTERVAL HISTORY: Please see below for problem oriented charting. she returns for treatment follow-up seen prior to cycle 1 of treatment She is doing well Her abdominal wall is healing well She has no concerns today  REVIEW OF SYSTEMS:   Constitutional: Denies fevers, chills or abnormal weight loss Eyes: Denies blurriness of vision Ears, nose, mouth, throat, and face: Denies mucositis or sore throat Respiratory: Denies cough, dyspnea or wheezes Cardiovascular: Denies palpitation, chest discomfort or lower extremity swelling Gastrointestinal:  Denies nausea, heartburn or change in bowel habits Skin: Denies abnormal skin rashes Lymphatics: Denies new lymphadenopathy or easy bruising Neurological:Denies numbness, tingling or new weaknesses Behavioral/Psych: Mood is stable, no new changes  All other systems were reviewed with the patient and are negative.  I have reviewed the past medical history, past surgical history, social  history and family history with the patient and they are unchanged from previous note.  ALLERGIES:  is allergic to tylenol [acetaminophen] and latex.  MEDICATIONS:  Current Outpatient Medications  Medication Sig Dispense Refill   Cyanocobalamin (VITAMIN B 12 PO) Take 1 tablet by mouth.     dexamethasone (DECADRON) 4 MG tablet Take 2 tabs at the night before and 2 tab the morning of chemotherapy, every 3 weeks, by mouth x 6 cycles 24 tablet 6   escitalopram (LEXAPRO) 10 MG tablet Take 1 tablet (10 mg total) by mouth daily. 90 tablet 3   lidocaine-prilocaine (EMLA) cream Apply to affected area once 30 g 3   Magnesium 200 MG TABS Take 2 tablets by mouth daily.     ondansetron (ZOFRAN) 8 MG tablet Take 1 tablet (8 mg total) by mouth every 8 (eight) hours as needed for nausea or vomiting. Start on the third day after chemotherapy. 30 tablet 1   pantoprazole (PROTONIX) 40 MG tablet Take 1 tablet (40 mg total) by mouth 2 (two) times daily. 120 tablet 0   prochlorperazine (COMPAZINE) 10 MG tablet Take 1 tablet (10 mg total) by mouth every 6 (six) hours as needed for nausea or vomiting. 30 tablet 1   VITAMIN D, CHOLECALCIFEROL, PO Take 1 tablet by mouth daily.     No current facility-administered medications for this visit.   Facility-Administered Medications Ordered in Other Visits  Medication Dose Route Frequency Provider Last Rate Last Admin   CARBOplatin (PARAPLATIN) 350 mg in sodium chloride 0.9 % 250 mL chemo infusion  350 mg Intravenous Once Alvy Bimler, Vianna Venezia, MD       heparin lock flush 100 unit/mL  500 Units Intracatheter Once PRN Heath Lark, MD  PACLitaxel (TAXOL) 288 mg in sodium chloride 0.9 % 250 mL chemo infusion (> '80mg'$ /m2)  175 mg/m2 (Treatment Plan Recorded) Intravenous Once Alvy Bimler, Belal Scallon, MD       sodium chloride flush (NS) 0.9 % injection 10 mL  10 mL Intracatheter PRN Alvy Bimler, Nylan Nakatani, MD       trastuzumab-anns (KANJINTI) 525 mg in sodium chloride 0.9 % 250 mL chemo infusion  8 mg/kg  (Treatment Plan Recorded) Intravenous Once Alvy Bimler, Jaydyn Menon, MD 183.3 mL/hr at 02/14/22 1013 525 mg at 02/14/22 1013    SUMMARY OF ONCOLOGIC HISTORY: Oncology History Overview Note  MMR normal MSI stable PD-L1 CPS: 1% Her2 positive P53 + carcinosarcoma   Carcinosarcoma of body of uterus ([place])  09/23/2021 Initial Diagnosis   She presented with PMB   09/28/2021 Imaging   US pelvis Enlarged uterus with fibroids. Pelvic sonogram is otherwise unremarkable.    11/11/2021 Pathology Results   A. CERVIX, MASS, EXCISION:  - HIGH-GRADE UNDIFFERENTIATED MALIGNANCY WITH EXTENSIVE NECROSIS (SEE COMMENT).   COMMENT:  The tumor consists of mostly high-grade sarcomatoid morphology with focal epithelioid morphology.  Immunohistochemical stains are performed on block A4.  The tumor cells are positive for desmin and p16 while negative for CD45, CK7, CK20, chromogranin, synaptophysin, Melan-A, p40, AE1/AE3, SMA, p63, cyclin D1, S100 and smooth muscle myosin.  Ki-67 proliferation index is high.  CD10 staining shows focal nonspecific staining.  The differential diagnosis includes a high-grade sarcoma and a partially sampled carcinosarcoma.    11/25/2021 Imaging   MRI pelvis Marked distention of the entire endometrial cavity by heterogeneously enhancing soft tissue density, which extends through the endocervical canal into the lower vagina. This is highly suspicious for endometrial carcinoma.   Diffuse myometrial thinning due to marked distention of endometrial cavity limits evaluation; deep myometrial invasion cannot be excluded in the uterine fundus.   No evidence of extra-uterine extension of tumor.   Lymphadenopathy in lower abdominal retroperitoneum, bilateral iliac chains, and sigmoid mesocolon, consistent with metastatic disease.   Sigmoid diverticulosis. No radiographic evidence of diverticulitis.   11/26/2021 Initial Diagnosis   Uterine cancer ([place])   11/26/2021 Cancer Staging   Staging form:  Corpus Uteri - Carcinoma and Carcinosarcoma, AJCC 8th Edition - Clinical stage from 11/26/2021: FIGO Stage IIIC2 (cT3, cN2, cM0) - Signed by Heath Lark, MD on 11/26/2021 Stage prefix: Initial diagnosis Histologic grade (G): G3 Histologic grading system: 3 grade system   11/30/2021 Pathology Results   FINAL MICROSCOPIC DIAGNOSIS:  A. UTERINE CONTENTS, BIOPSY: - HIGH-GRADE UNDIFFERENTIATED MALIGNANCY WITH EXTENSIVE NECROSIS (SEE COMMENT).  COMMENT: The patient's history of a high-grade undifferentiated malignancy with extensive necrosis of the cervix is noted.  The morphological features of the current tumor cells are similar to the tumor cells seen in the previous cervical mass excision.  Immunohistochemical staining for desmin and cytokeratin AE1/AE3 is performed on block A1.  The tumor cells are diffusely positive for desmin.   AE1/AE3 stain is focally positive, however this is scant and of unclear clinical significance. Given the small quantity of tissue in the current biopsy, a definitive distinction between a high-grade sarcoma and carcinosarcoma cannot be made as this biopsy may not be representative of the entire tumor. If clinically indicated, this case as well as the previous cervical mass excision ([id number]) may be sent out for expert consultation. Clinical correlation recommended.     12/01/2021 Tumor Marker   Patient's tumor was tested for the following markers: CA-125. Results of the tumor marker test revealed 491.   12/22/2021 Surgery  Robotic-assisted laparoscopic total hysterectomy with bilateral salpingo-oophorectomy, tumor debulking including high common iliac lymph node excision, aborted attempt of bilateral external iliac enlarged lymph nodes, mini-lap for specimen delivery, cystoscopy    12/22/2021 Pathology Results   FINAL MICROSCOPIC DIAGNOSIS:  A. LEFT CERVICAL MARGIN, EXCISION: - Positive for carcinoma  B. HIGH LEFT COMMON ILIAC LYMPH NODE, EXCISION: -  Metastatic carcinosarcoma involving the lymph node  C. 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, including undifferentiated sarcoma with rhabdoid features and high-grade serous carcinoma, see comment - Tumor invades for a depth of 1 mm where myometrial thickness is 5 mm - Benign bilateral fallopian tubes and ovaries - See oncology table - See comment  ONCOLOGY TABLE:  UTERUS, CARCINOMA OR CARCINOSARCOMA: Resection  Procedure: Total hysterectomy and bilateral salpingo-oophorectomy Histologic Type: Carcinosarcoma (mixed malignant Mullerian tumor) Histologic Grade: High-grade Myometrial Invasion:      Depth of Myometrial Invasion (mm): 1 mm      Myometrial Thickness (mm): 5 mm      Percentage of Myometrial Invasion: 20% Uterine Serosa Involvement: Not identified Cervical stromal Involvement: Present Extent of involvement of other tissue/organs: Not identified Peritoneal/Ascitic Fluid: Not applicable Lymphovascular Invasion: Present Regional Lymph Nodes:      Pelvic Lymph Nodes Examined:                                  0 Sentinel                                  1 Non-sentinel                                  1 Total      Pelvic Lymph Nodes with Metastasis: 1                          Macrometastasis: (>2.0 mm): 1                          Micrometastasis: (>0.2 mm and < 2.0 mm): 0                          Isolated Tumor Cells (<0.2 mm): 0                          Laterality of Lymph Node with Tumor: Left                          Extracapsular Extension: Not identified      Para-aortic Lymph Nodes Examined:                                   0 Sentinel                                   0 non-sentinel  0 total Distant Metastasis:      Distant Site(s) Involved: Not applicable Pathologic Stage Classification (pTNM, AJCC 8th Edition): pT1a, pN1 Ancillary Studies: MMR  / MSI testing will be ordered Representative Tumor Block: C1 Comment(s): None  ([id number])       01/13/2022 Procedure   Placement of single lumen port a cath via right internal jugular vein. The catheter tip lies at the cavo-atrial junction. A power injectable port a cath was placed and is ready for immediate use.     01/19/2022 Echocardiogram    1. Left ventricular ejection fraction, by estimation, is 60 to 65%. The left ventricle has normal function. The left ventricle has no regional wall motion abnormalities. Left ventricular diastolic parameters are consistent with Grade I diastolic dysfunction (impaired relaxation). The average left ventricular global longitudinal strain is -24.0 %. The global longitudinal strain is normal.  2. Right ventricular systolic function is normal. The right ventricular size is normal. There is normal pulmonary artery systolic pressure.  3. Left atrial size was mildly dilated.  4. The mitral valve is normal in structure. No evidence of mitral valve regurgitation. No evidence of mitral stenosis.  5. The aortic valve was not well visualized. Aortic valve regurgitation is not visualized. Aortic valve sclerosis is present, with no evidence of aortic valve stenosis.  6. The inferior vena cava is normal in size with greater than 50% respiratory variability, suggesting right atrial pressure of 3 mmHg.     01/27/2022 Tumor Marker   Patient's tumor was tested for the following markers: CA-125. Results of the tumor marker test revealed 307.   02/14/2022 -  Chemotherapy   Patient is on Treatment Plan : UTERINE SEROUS CARCINOMA Carboplatin + Paclitaxel + Trastuzumab q21d x 6 Cycles / Trastuzumab q21d       PHYSICAL EXAMINATION: GENERAL:alert, no distress and comfortable SKIN: Her abdominal wall wound is healing well EYES: normal, Conjunctiva are pink and non-injected, sclera clear  NEURO: alert & oriented x 3 with fluent speech, no focal motor/sensory  deficits  LABORATORY DATA:  I have reviewed the data as listed    Component Value Date/Time   NA 134 (L) 02/14/2022 0803   NA 140 11/02/2016 1130   K 3.9 02/14/2022 0803   K 3.8 11/02/2016 1130   CL 101 02/14/2022 0803   CL 105 05/02/2012 0822   CO2 23 02/14/2022 0803   CO2 26 11/02/2016 1130   GLUCOSE 151 (H) 02/14/2022 0803   GLUCOSE 87 11/02/2016 1130   GLUCOSE 98 05/02/2012 0822   BUN 27 (H) 02/14/2022 0803   BUN 14.7 11/02/2016 1130   CREATININE 1.03 (H) 02/14/2022 0803   CREATININE 0.84 12/05/2018 1014   CREATININE 0.8 11/02/2016 1130   CALCIUM 9.4 02/14/2022 0803   CALCIUM 9.7 11/02/2016 1130   PROT 7.2 02/14/2022 0803   PROT 6.8 11/02/2016 1130   ALBUMIN 3.6 02/14/2022 0803   ALBUMIN 3.6 11/02/2016 1130   AST 17 02/14/2022 0803   AST 23 11/02/2016 1130   ALT 14 02/14/2022 0803   ALT 17 11/02/2016 1130   ALKPHOS 89 02/14/2022 0803   ALKPHOS 59 11/02/2016 1130   BILITOT 0.3 02/14/2022 0803   BILITOT 0.34 11/02/2016 1130   GFRNONAA 57 (L) 02/14/2022 0803   GFRAA >60 11/08/2017 0504   GFRAA >60 07/25/2017 1425    No results found for: "SPEP", "UPEP"  Lab Results  Component Value Date   WBC 4.1 02/14/2022   NEUTROABS 3.5 02/14/2022   HGB 11.1 (L) 02/14/2022  HCT 33.6 (L) 02/14/2022   MCV 91.3 02/14/2022   PLT 286 02/14/2022      Chemistry      Component Value Date/Time   NA 134 (L) 02/14/2022 0803   NA 140 11/02/2016 1130   K 3.9 02/14/2022 0803   K 3.8 11/02/2016 1130   CL 101 02/14/2022 0803   CL 105 05/02/2012 0822   CO2 23 02/14/2022 0803   CO2 26 11/02/2016 1130   BUN 27 (H) 02/14/2022 0803   BUN 14.7 11/02/2016 1130   CREATININE 1.03 (H) 02/14/2022 0803   CREATININE 0.84 12/05/2018 1014   CREATININE 0.8 11/02/2016 1130      Component Value Date/Time   CALCIUM 9.4 02/14/2022 0803   CALCIUM 9.7 11/02/2016 1130   ALKPHOS 89 02/14/2022 0803   ALKPHOS 59 11/02/2016 1130   AST 17 02/14/2022 0803   AST 23 11/02/2016 1130   ALT 14  02/14/2022 0803   ALT 17 11/02/2016 1130   BILITOT 0.3 02/14/2022 0803   BILITOT 0.34 11/02/2016 1130       RADIOGRAPHIC STUDIES: I have personally reviewed the radiological images as listed and agreed with the findings in the report. ECHOCARDIOGRAM COMPLETE  Result Date: 01/19/2022    ECHOCARDIOGRAM REPORT   Patient Name:   NICHOL ATOR [facility] Date of Exam: 01/19/2022 Medical Rec #:  [id number]      Height:       59.5 in Accession #:    [id number]     Weight:       144.6 lb Date of Birth:  1945/01/17      BSA:          1.616 m Patient Age:    77 years       BP:           135/79 mmHg Patient Gender: F              HR:           81 bpm. Exam Location:  Outpatient Procedure: 2D Echo, 3D Echo, Color Doppler, Cardiac Doppler and Strain Analysis Indications:    Chemo Evaluation  History:        Patient has no prior history of Echocardiogram examinations.                 Risk Factors:Dyslipidemia.  Sonographer:    Raquel Sarna Senior RDCS Referring Phys: [phone] Davie Claud Geary  1. Left ventricular ejection fraction, by estimation, is 60 to 65%. The left ventricle has normal function. The left ventricle has no regional wall motion abnormalities. Left ventricular diastolic parameters are consistent with Grade I diastolic dysfunction (impaired relaxation). The average left ventricular global longitudinal strain is -24.0 %. The global longitudinal strain is normal.  2. Right ventricular systolic function is normal. The right ventricular size is normal. There is normal pulmonary artery systolic pressure.  3. Left atrial size was mildly dilated.  4. The mitral valve is normal in structure. No evidence of mitral valve regurgitation. No evidence of mitral stenosis.  5. The aortic valve was not well visualized. Aortic valve regurgitation is not visualized. Aortic valve sclerosis is present, with no evidence of aortic valve stenosis.  6. The inferior vena cava is normal in size with greater than 50% respiratory variability,  suggesting right atrial pressure of 3 mmHg. Comparison(s): No prior Echocardiogram. FINDINGS  Left Ventricle: Left ventricular ejection fraction, by estimation, is 60 to 65%. The left ventricle has normal function. The left ventricle has no regional wall  motion abnormalities. The average left ventricular global longitudinal strain is -24.0 %. The global longitudinal strain is normal. The left ventricular internal cavity size was small. There is no left ventricular hypertrophy. Left ventricular diastolic parameters are consistent with Grade I diastolic dysfunction (impaired relaxation). Right Ventricle: The right ventricular size is normal. No increase in right ventricular wall thickness. Right ventricular systolic function is normal. There is normal pulmonary artery systolic pressure. The tricuspid regurgitant velocity is 2.46 m/s, and  with an assumed right atrial pressure of 3 mmHg, the estimated right ventricular systolic pressure is 79.0 mmHg. Left Atrium: Left atrial size was mildly dilated. Right Atrium: Right atrial size was normal in size. Pericardium: Trivial pericardial effusion is present. Mitral Valve: The mitral valve is normal in structure. No evidence of mitral valve regurgitation. No evidence of mitral valve stenosis. Tricuspid Valve: The tricuspid valve is normal in structure. Tricuspid valve regurgitation is not demonstrated. No evidence of tricuspid stenosis. Aortic Valve: The aortic valve was not well visualized. Aortic valve regurgitation is not visualized. Aortic valve sclerosis is present, with no evidence of aortic valve stenosis. Pulmonic Valve: The pulmonic valve was not well visualized. Pulmonic valve regurgitation is not visualized. No evidence of pulmonic stenosis. Aorta: The aortic root and ascending aorta are structurally normal, with no evidence of dilitation. Venous: The inferior vena cava is normal in size with greater than 50% respiratory variability, suggesting right atrial  pressure of 3 mmHg. IAS/Shunts: No atrial level shunt detected by color flow Doppler.  LEFT VENTRICLE PLAX 2D LVIDd:         3.80 cm   Diastology LVIDs:         2.30 cm   LV e' medial:    8.27 cm/s LV PW:         0.80 cm   LV E/e' medial:  11.1 LV IVS:        0.80 cm   LV e' lateral:   9.25 cm/s LVOT diam:     1.80 cm   LV E/e' lateral: 9.9 LV SV:         58 LV SV Index:   36        2D Longitudinal Strain LVOT Area:     2.54 cm  2D Strain GLS Avg:     -24.0 %  RIGHT VENTRICLE RV S prime:     22.20 cm/s TAPSE (M-mode): 2.1 cm LEFT ATRIUM             Index        RIGHT ATRIUM           Index LA diam:        2.80 cm 1.73 cm/m   RA Area:     17.20 cm LA Vol (A2C):   61.1 ml 37.81 ml/m  RA Volume:   48.20 ml  29.82 ml/m LA Vol (A4C):   54.7 ml 33.85 ml/m LA Biplane Vol: 58.0 ml 35.89 ml/m  AORTIC VALVE LVOT Vmax:   117.00 cm/s LVOT Vmean:  82.800 cm/s LVOT VTI:    0.226 m  AORTA Ao Root diam: 2.80 cm Ao Asc diam:  3.00 cm MITRAL VALVE                TRICUSPID VALVE MV Area (PHT): 4.86 cm     TR Peak grad:   24.2 mmHg MV Decel Time: 156 msec     TR Vmax:        246.00 cm/s MV E velocity: 91.70  cm/s MV A velocity: 111.00 cm/s  SHUNTS MV E/A ratio:  0.83         Systemic VTI:  0.23 m                             Systemic Diam: 1.80 cm Rudean Haskell MD Electronically signed by Rudean Haskell MD Signature Date/Time: 01/19/2022/3:53:00 PM    Final

## 2022-02-14 NOTE — Assessment & Plan Note (Signed)
Her abdominal wound is almost completely healed We will start treatment as scheduled

## 2022-02-14 NOTE — Assessment & Plan Note (Signed)
This is improving We will observe closely She could be at risk of dehiscence with treatment

## 2022-02-14 NOTE — Patient Instructions (Signed)
[facility]  Discharge Instructions: Thank you for choosing [facility] to provide your oncology and hematology care.   If you have a lab appointment with the [facility], please go directly to the [facility] and check in at the registration area.   Wear comfortable clothing and clothing appropriate for easy access to any Portacath or PICC line.   We strive to give you quality time with your provider. You may need to reschedule your appointment if you arrive late (15 or more minutes).  Arriving late affects you and other patients whose appointments are after yours.  Also, if you miss three or more appointments without notifying the office, you may be dismissed from the clinic at the provider's discretion.      For prescription refill requests, have your pharmacy contact our office and allow 72 hours for refills to be completed.    Today you received the following chemotherapy and/or immunotherapy agents Kanjinti, Taxol & Carboplatin      To help prevent nausea and vomiting after your treatment, we encourage you to take your nausea medication as directed.  BELOW ARE SYMPTOMS THAT SHOULD BE REPORTED IMMEDIATELY: *FEVER GREATER THAN 100.4 F (38 C) OR HIGHER *CHILLS OR SWEATING *NAUSEA AND VOMITING THAT IS NOT CONTROLLED WITH YOUR NAUSEA MEDICATION *UNUSUAL SHORTNESS OF BREATH *UNUSUAL BRUISING OR BLEEDING *URINARY PROBLEMS (pain or burning when urinating, or frequent urination) *BOWEL PROBLEMS (unusual diarrhea, constipation, pain near the anus) TENDERNESS IN MOUTH AND THROAT WITH OR WITHOUT PRESENCE OF ULCERS (sore throat, sores in mouth, or a toothache) UNUSUAL RASH, SWELLING OR PAIN  UNUSUAL VAGINAL DISCHARGE OR ITCHING   Items with * indicate a potential emergency and should be followed up as soon as possible or go to the Emergency Department if any problems should occur.  Please show the CHEMOTHERAPY ALERT CARD or IMMUNOTHERAPY  ALERT CARD at check-in to the Emergency Department and triage nurse.  Should you have questions after your visit or need to cancel or reschedule your appointment, please contact [facility]  Dept: [phone]  and follow the prompts.  Office hours are 8:00 a.m. to 4:30 p.m. Monday - Friday. Please note that voicemails left after 4:00 p.m. may not be returned until the following business day.  We are closed weekends and major holidays. You have access to a nurse at all times for urgent questions. Please call the main number to the clinic Dept: [phone] and follow the prompts.   For any non-urgent questions, you may also contact your provider using MyChart. We now offer e-Visits for anyone 39 and older to request care online for non-urgent symptoms. For details visit [email or web address].   Also download the MyChart app! Go to the app store, search "MyChart", open the app, select [facility], and log in with your MyChart username and password.  Trastuzumab Injection What is this medication? TRASTUZUMAB (tras TOO zoo mab) treats breast cancer and stomach cancer. It works by blocking a protein that causes cancer cells to grow and multiply. This helps to slow or stop the spread of cancer cells. This medicine may be used for other purposes; ask your health care provider or pharmacist if you have questions. COMMON BRAND NAME(S): Herceptin, Janae Bridgeman, Ontruzant, Trazimera What should I tell my care team before I take this medication? They need to know if you have any of these conditions: Heart failure Lung disease An unusual or allergic reaction to  trastuzumab, other medications, foods, dyes, or preservatives Pregnant or trying to get pregnant Breast-feeding How should I use this medication? This medication is injected into a vein. It is given by your care team in a hospital or clinic setting. Talk to your care team about the use of this  medication in children. It is not approved for use in children. Overdosage: If you think you have taken too much of this medicine contact a poison control center or emergency room at once. NOTE: This medicine is only for you. Do not share this medicine with others. What if I miss a dose? Keep appointments for follow-up doses. It is important not to miss your dose. Call your care team if you are unable to keep an appointment. What may interact with this medication? Certain types of chemotherapy, such as daunorubicin, doxorubicin, epirubicin, idarubicin This list may not describe all possible interactions. Give your health care provider a list of all the medicines, herbs, non-prescription drugs, or dietary supplements you use. Also tell them if you smoke, drink alcohol, or use illegal drugs. Some items may interact with your medicine. What should I watch for while using this medication? Your condition will be monitored carefully while you are receiving this medication. This medication may make you feel generally unwell. This is not uncommon, as chemotherapy affects healthy cells as well as cancer cells. Report any side effects. Continue your course of treatment even though you feel ill unless your care team tells you to stop. This medication may increase your risk of getting an infection. Call your care team for advice if you get a fever, chills, sore throat, or other symptoms of a cold or flu. Do not treat yourself. Try to avoid being around people who are sick. Avoid taking medications that contain aspirin, acetaminophen, ibuprofen, naproxen, or ketoprofen unless instructed by your care team. These medications can hide a fever. Talk to your care team if you may be pregnant. Serious birth defects can occur if you take this medication during pregnancy and for 7 months after the last dose. You will need a negative pregnancy test before starting this medication. Contraception is recommended while taking  this medication and for 7 months after the last dose. Your care team can help you find the option that works for you. Do not breastfeed while taking this medication and for 7 months after stopping treatment. What side effects may I notice from receiving this medication? Side effects that you should report to your care team as soon as possible: Allergic reactions or angioedema--skin rash, itching or hives, swelling of the face, eyes, lips, tongue, arms, or legs, trouble swallowing or breathing Dry cough, shortness of breath or trouble breathing Heart failure--shortness of breath, swelling of the ankles, feet, or hands, sudden weight gain, unusual weakness or fatigue Infection--fever, chills, cough, or sore throat Infusion reactions--chest pain, shortness of breath or trouble breathing, feeling faint or lightheaded Side effects that usually do not require medical attention (report to your care team if they continue or are bothersome): Diarrhea Dizziness Headache Nausea Trouble sleeping Vomiting This list may not describe all possible side effects. Call your doctor for medical advice about side effects. You may report side effects to FDA at 1-800-FDA-1088. Where should I keep my medication? This medication is given in a hospital or clinic. It will not be stored at home. NOTE: This sheet is a summary. It may not cover all possible information. If you have questions about this medicine, talk to your doctor, pharmacist,  or health care provider.  2023 Elsevier/Gold Standard (2021-04-29 00:00:00)  Paclitaxel Injection What is this medication? PACLITAXEL (PAK li TAX el) treats some types of cancer. It works by slowing down the growth of cancer cells. This medicine may be used for other purposes; ask your health care provider or pharmacist if you have questions. COMMON BRAND NAME(S): Onxol, Taxol What should I tell my care team before I take this medication? They need to know if you have any of  these conditions: Heart disease Liver disease Low white blood cell levels An unusual or allergic reaction to paclitaxel, other medications, foods, dyes, or preservatives If you or your partner are pregnant or trying to get pregnant Breast-feeding How should I use this medication? This medication is injected into a vein. It is given by your care team in a hospital or clinic setting. Talk to your care team about the use of this medication in children. While it may be given to children for selected conditions, precautions do apply. Overdosage: If you think you have taken too much of this medicine contact a poison control center or emergency room at once. NOTE: This medicine is only for you. Do not share this medicine with others. What if I miss a dose? Keep appointments for follow-up doses. It is important not to miss your dose. Call your care team if you are unable to keep an appointment. What may interact with this medication? Do not take this medication with any of the following: Live virus vaccines Other medications may affect the way this medication works. Talk with your care team about all of the medications you take. They may suggest changes to your treatment plan to lower the risk of side effects and to make sure your medications work as intended. This list may not describe all possible interactions. Give your health care provider a list of all the medicines, herbs, non-prescription drugs, or dietary supplements you use. Also tell them if you smoke, drink alcohol, or use illegal drugs. Some items may interact with your medicine. What should I watch for while using this medication? Your condition will be monitored carefully while you are receiving this medication. You may need blood work while taking this medication. This medication may make you feel generally unwell. This is not uncommon as chemotherapy can affect healthy cells as well as cancer cells. Report any side effects. Continue your  course of treatment even though you feel ill unless your care team tells you to stop. This medication can cause serious allergic reactions. To reduce the risk, your care team may give you other medications to take before receiving this one. Be sure to follow the directions from your care team. This medication may increase your risk of getting an infection. Call your care team for advice if you get a fever, chills, sore throat, or other symptoms of a cold or flu. Do not treat yourself. Try to avoid being around people who are sick. This medication may increase your risk to bruise or bleed. Call your care team if you notice any unusual bleeding. Be careful brushing or flossing your teeth or using a toothpick because you may get an infection or bleed more easily. If you have any dental work done, tell your dentist you are receiving this medication. Talk to your care team if you may be pregnant. Serious birth defects can occur if you take this medication during pregnancy. Talk to your care team before breastfeeding. Changes to your treatment plan may be needed. What side  effects may I notice from receiving this medication? Side effects that you should report to your care team as soon as possible: Allergic reactions--skin rash, itching, hives, swelling of the face, lips, tongue, or throat Heart rhythm changes--fast or irregular heartbeat, dizziness, feeling faint or lightheaded, chest pain, trouble breathing Increase in blood pressure Infection--fever, chills, cough, sore throat, wounds that don't heal, pain or trouble when passing urine, general feeling of discomfort or being unwell Low blood pressure--dizziness, feeling faint or lightheaded, blurry vision Low red blood cell level--unusual weakness or fatigue, dizziness, headache, trouble breathing Painful swelling, warmth, or redness of the skin, blisters or sores at the infusion site Pain, tingling, or numbness in the hands or feet Slow  heartbeat--dizziness, feeling faint or lightheaded, confusion, trouble breathing, unusual weakness or fatigue Unusual bruising or bleeding Side effects that usually do not require medical attention (report to your care team if they continue or are bothersome): Diarrhea Hair loss Joint pain Loss of appetite Muscle pain Nausea Vomiting This list may not describe all possible side effects. Call your doctor for medical advice about side effects. You may report side effects to FDA at 1-800-FDA-1088. Where should I keep my medication? This medication is given in a hospital or clinic. It will not be stored at home. NOTE: This sheet is a summary. It may not cover all possible information. If you have questions about this medicine, talk to your doctor, pharmacist, or health care provider.  2023 Elsevier/Gold Standard (2021-04-28 00:00:00)  Carboplatin Injection What is this medication? CARBOPLATIN (KAR boe pla tin) treats some types of cancer. It works by slowing down the growth of cancer cells. This medicine may be used for other purposes; ask your health care provider or pharmacist if you have questions. COMMON BRAND NAME(S): Paraplatin What should I tell my care team before I take this medication? They need to know if you have any of these conditions: Blood disorders Hearing problems Kidney disease Recent or ongoing radiation therapy An unusual or allergic reaction to carboplatin, cisplatin, other medications, foods, dyes, or preservatives Pregnant or trying to get pregnant Breast-feeding How should I use this medication? This medication is injected into a vein. It is given by your care team in a hospital or clinic setting. Talk to your care team about the use of this medication in children. Special care may be needed. Overdosage: If you think you have taken too much of this medicine contact a poison control center or emergency room at once. NOTE: This medicine is only for you. Do not  share this medicine with others. What if I miss a dose? Keep appointments for follow-up doses. It is important not to miss your dose. Call your care team if you are unable to keep an appointment. What may interact with this medication? Medications for seizures Some antibiotics, such as amikacin, gentamicin, neomycin, streptomycin, tobramycin Vaccines This list may not describe all possible interactions. Give your health care provider a list of all the medicines, herbs, non-prescription drugs, or dietary supplements you use. Also tell them if you smoke, drink alcohol, or use illegal drugs. Some items may interact with your medicine. What should I watch for while using this medication? Your condition will be monitored carefully while you are receiving this medication. You may need blood work while taking this medication. This medication may make you feel generally unwell. This is not uncommon, as chemotherapy can affect healthy cells as well as cancer cells. Report any side effects. Continue your course of treatment even  though you feel ill unless your care team tells you to stop. In some cases, you may be given additional medications to help with side effects. Follow all directions for their use. This medication may increase your risk of getting an infection. Call your care team for advice if you get a fever, chills, sore throat, or other symptoms of a cold or flu. Do not treat yourself. Try to avoid being around people who are sick. Avoid taking medications that contain aspirin, acetaminophen, ibuprofen, naproxen, or ketoprofen unless instructed by your care team. These medications may hide a fever. Be careful brushing or flossing your teeth or using a toothpick because you may get an infection or bleed more easily. If you have any dental work done, tell your dentist you are receiving this medication. Talk to your care team if you wish to become pregnant or think you might be pregnant. This medication  can cause serious birth defects. Talk to your care team about effective forms of contraception. Do not breast-feed while taking this medication. What side effects may I notice from receiving this medication? Side effects that you should report to your care team as soon as possible: Allergic reactions--skin rash, itching, hives, swelling of the face, lips, tongue, or throat Infection--fever, chills, cough, sore throat, wounds that don't heal, pain or trouble when passing urine, general feeling of discomfort or being unwell Low red blood cell level--unusual weakness or fatigue, dizziness, headache, trouble breathing Pain, tingling, or numbness in the hands or feet, muscle weakness, change in vision, confusion or trouble speaking, loss of balance or coordination, trouble walking, seizures Unusual bruising or bleeding Side effects that usually do not require medical attention (report to your care team if they continue or are bothersome): Hair loss Nausea Unusual weakness or fatigue Vomiting This list may not describe all possible side effects. Call your doctor for medical advice about side effects. You may report side effects to FDA at 1-800-FDA-1088. Where should I keep my medication? This medication is given in a hospital or clinic. It will not be stored at home. NOTE: This sheet is a summary. It may not cover all possible information. If you have questions about this medicine, talk to your doctor, pharmacist, or health care provider.  2023 Elsevier/Gold Standard (2021-04-12 00:00:00)

## 2022-02-15 LAB — CA 125: Cancer Antigen (CA) 125: 161 U/mL — ABNORMAL HIGH (ref 0.0–38.1)

## 2022-02-15 NOTE — Telephone Encounter (Unsigned)
Called pt to see how she did with her recent treatment.  Left message on identified vm to call us back.

## 2022-02-15 NOTE — Telephone Encounter (Signed)
-----   Message from Rolene Course, RN sent at 02/14/2022  4:01 PM EST ----- Regarding: Alvy Bimler 1st Tx F/U call - Kanjinti/Taxol/Carbo Gorsuch 1st Tx F/U call - Kanjinti/Taxol/Carbo.  Tolerated tx well.

## 2022-02-16 ENCOUNTER — Other Ambulatory Visit: Payer: Self-pay | Admitting: Gastroenterology

## 2022-02-18 ENCOUNTER — Telehealth: Payer: Self-pay

## 2022-02-18 NOTE — Telephone Encounter (Signed)
Agree, we will follow-up on her next week

## 2022-02-18 NOTE — Telephone Encounter (Signed)
Returned her call. She started having knee joint/pain yesterday. She has not taken any OTC pain medication to see if it helps. She has applied heating pad to see if that helps. She is allergic to tylenol and is not supposed to take Advil.  She has 10 tramadol left over from surgery. Instructed to take the tramadol prn for pain and call the office back for questions/ concerns. She verbalized understanding.

## 2022-02-21 ENCOUNTER — Telehealth: Payer: Self-pay

## 2022-02-21 NOTE — Telephone Encounter (Signed)
Called and left a message asking her to call the office back with update on how she is doing.

## 2022-02-21 NOTE — Telephone Encounter (Signed)
-----   Message from Heath Lark, MD sent at 02/21/2022  8:08 AM EST ----- Can you call and check on her?

## 2022-02-21 NOTE — Telephone Encounter (Signed)
She called back and left a message. She took the Tramadol and used a heating pad and is feeling better now. Her knee joint pain and cramps went aware on Saturday. She appreciated the call.

## 2022-02-22 ENCOUNTER — Other Ambulatory Visit: Payer: Medicare HMO | Admitting: Licensed Clinical Social Worker

## 2022-02-22 ENCOUNTER — Ambulatory Visit: Payer: Medicare HMO

## 2022-02-22 ENCOUNTER — Other Ambulatory Visit: Payer: Medicare HMO

## 2022-02-22 ENCOUNTER — Ambulatory Visit: Payer: Medicare HMO | Admitting: Hematology and Oncology

## 2022-02-25 ENCOUNTER — Encounter: Payer: Self-pay | Admitting: Gastroenterology

## 2022-03-01 DIAGNOSIS — T8149XA Infection following a procedure, other surgical site, initial encounter: Secondary | ICD-10-CM

## 2022-03-04 MED FILL — Dexamethasone Sodium Phosphate Inj 100 MG/10ML: INTRAMUSCULAR | Qty: 1 | Status: AC

## 2022-03-04 MED FILL — Fosaprepitant Dimeglumine For IV Infusion 150 MG (Base Eq): INTRAVENOUS | Qty: 5 | Status: AC

## 2022-03-07 ENCOUNTER — Inpatient Hospital Stay: Payer: Medicare HMO

## 2022-03-07 ENCOUNTER — Encounter: Payer: Self-pay | Admitting: Hematology and Oncology

## 2022-03-07 ENCOUNTER — Inpatient Hospital Stay: Payer: Medicare HMO | Admitting: Hematology and Oncology

## 2022-03-07 ENCOUNTER — Inpatient Hospital Stay: Payer: Medicare HMO | Admitting: Licensed Clinical Social Worker

## 2022-03-07 ENCOUNTER — Other Ambulatory Visit: Payer: Self-pay

## 2022-03-07 VITALS — BP 176/80 | HR 80 | Temp 97.7°F | Resp 16

## 2022-03-07 VITALS — BP 178/91 | HR 95 | Temp 97.9°F | Resp 18 | Ht 59.5 in | Wt 148.2 lb

## 2022-03-07 DIAGNOSIS — C549 Malignant neoplasm of corpus uteri, unspecified: Secondary | ICD-10-CM

## 2022-03-07 DIAGNOSIS — M2550 Pain in unspecified joint: Secondary | ICD-10-CM | POA: Diagnosis not present

## 2022-03-07 DIAGNOSIS — N189 Chronic kidney disease, unspecified: Secondary | ICD-10-CM | POA: Diagnosis not present

## 2022-03-07 DIAGNOSIS — N183 Chronic kidney disease, stage 3 unspecified: Secondary | ICD-10-CM

## 2022-03-07 DIAGNOSIS — C775 Secondary and unspecified malignant neoplasm of intrapelvic lymph nodes: Secondary | ICD-10-CM | POA: Diagnosis not present

## 2022-03-07 DIAGNOSIS — D631 Anemia in chronic kidney disease: Secondary | ICD-10-CM | POA: Diagnosis not present

## 2022-03-07 DIAGNOSIS — Z5111 Encounter for antineoplastic chemotherapy: Secondary | ICD-10-CM | POA: Diagnosis not present

## 2022-03-07 DIAGNOSIS — Z5112 Encounter for antineoplastic immunotherapy: Secondary | ICD-10-CM | POA: Diagnosis not present

## 2022-03-07 LAB — CBC WITH DIFFERENTIAL (CANCER CENTER ONLY)
Abs Immature Granulocytes: 0.02 10*3/uL (ref 0.00–0.07)
Basophils Absolute: 0 10*3/uL (ref 0.0–0.1)
Basophils Relative: 0 %
Eosinophils Absolute: 0 10*3/uL (ref 0.0–0.5)
Eosinophils Relative: 0 %
HCT: 33.7 % — ABNORMAL LOW (ref 36.0–46.0)
Hemoglobin: 11.1 g/dL — ABNORMAL LOW (ref 12.0–15.0)
Immature Granulocytes: 0 %
Lymphocytes Relative: 18 %
Lymphs Abs: 0.9 10*3/uL (ref 0.7–4.0)
MCH: 30.1 pg (ref 26.0–34.0)
MCHC: 32.9 g/dL (ref 30.0–36.0)
MCV: 91.3 fL (ref 80.0–100.0)
Monocytes Absolute: 0.2 10*3/uL (ref 0.1–1.0)
Monocytes Relative: 4 %
Neutro Abs: 3.6 10*3/uL (ref 1.7–7.7)
Neutrophils Relative %: 78 %
Platelet Count: 183 10*3/uL (ref 150–400)
RBC: 3.69 MIL/uL — ABNORMAL LOW (ref 3.87–5.11)
RDW: 15 % (ref 11.5–15.5)
WBC Count: 4.8 10*3/uL (ref 4.0–10.5)
nRBC: 0 % (ref 0.0–0.2)

## 2022-03-07 LAB — CMP (CANCER CENTER ONLY)
ALT: 12 U/L (ref 0–44)
AST: 15 U/L (ref 15–41)
Albumin: 3.8 g/dL (ref 3.5–5.0)
Alkaline Phosphatase: 102 U/L (ref 38–126)
Anion gap: 10 (ref 5–15)
BUN: 23 mg/dL (ref 8–23)
CO2: 23 mmol/L (ref 22–32)
Calcium: 8.8 mg/dL — ABNORMAL LOW (ref 8.9–10.3)
Chloride: 102 mmol/L (ref 98–111)
Creatinine: 1.03 mg/dL — ABNORMAL HIGH (ref 0.44–1.00)
GFR, Estimated: 57 mL/min — ABNORMAL LOW (ref >60)
Glucose, Bld: 134 mg/dL — ABNORMAL HIGH (ref 70–99)
Potassium: 4 mmol/L (ref 3.5–5.1)
Sodium: 135 mmol/L (ref 135–145)
Total Bilirubin: 0.2 mg/dL — ABNORMAL LOW (ref 0.3–1.2)
Total Protein: 7.2 g/dL (ref 6.5–8.1)

## 2022-03-07 MED ORDER — PALONOSETRON HCL INJECTION 0.25 MG/5ML
0.2500 mg | Freq: Once | INTRAVENOUS | Status: AC
  Administered 2022-03-07: 0.25 mg via INTRAVENOUS
  Filled 2022-03-07: qty 5

## 2022-03-07 MED ORDER — SODIUM CHLORIDE 0.9 % IV SOLN: Freq: Once | INTRAVENOUS | Status: AC

## 2022-03-07 MED ORDER — HEPARIN SOD (PORK) LOCK FLUSH 100 UNIT/ML IV SOLN
500.0000 [IU] | Freq: Once | INTRAVENOUS | Status: AC | PRN
  Administered 2022-03-07: 500 [IU]

## 2022-03-07 MED ORDER — SODIUM CHLORIDE 0.9 % IV SOLN
175.0000 mg/m2 | Freq: Once | INTRAVENOUS | Status: AC
  Administered 2022-03-07: 294 mg via INTRAVENOUS
  Filled 2022-03-07: qty 49

## 2022-03-07 MED ORDER — SODIUM CHLORIDE 0.9% FLUSH
10.0000 mL | Freq: Once | INTRAVENOUS | Status: AC
  Administered 2022-03-07: 10 mL

## 2022-03-07 MED ORDER — TRASTUZUMAB-ANNS CHEMO 150 MG IV SOLR
6.0000 mg/kg | Freq: Once | INTRAVENOUS | Status: AC
  Administered 2022-03-07: 420 mg via INTRAVENOUS
  Filled 2022-03-07: qty 20

## 2022-03-07 MED ORDER — SODIUM CHLORIDE 0.9 % IV SOLN
372.5000 mg | Freq: Once | INTRAVENOUS | Status: AC
  Administered 2022-03-07: 350 mg via INTRAVENOUS
  Filled 2022-03-07: qty 35

## 2022-03-07 MED ORDER — FAMOTIDINE IN NACL 20-0.9 MG/50ML-% IV SOLN
20.0000 mg | Freq: Once | INTRAVENOUS | Status: AC
  Administered 2022-03-07: 20 mg via INTRAVENOUS
  Filled 2022-03-07: qty 50

## 2022-03-07 MED ORDER — SODIUM CHLORIDE 0.9 % IV SOLN
10.0000 mg | Freq: Once | INTRAVENOUS | Status: AC
  Administered 2022-03-07: 10 mg via INTRAVENOUS
  Filled 2022-03-07: qty 10

## 2022-03-07 MED ORDER — SODIUM CHLORIDE 0.9% FLUSH
10.0000 mL | INTRAVENOUS | Status: DC | PRN
  Administered 2022-03-07: 10 mL

## 2022-03-07 MED ORDER — SODIUM CHLORIDE 0.9 % IV SOLN
150.0000 mg | Freq: Once | INTRAVENOUS | Status: AC
  Administered 2022-03-07: 150 mg via INTRAVENOUS
  Filled 2022-03-07: qty 150

## 2022-03-07 MED ORDER — CETIRIZINE HCL 10 MG/ML IV SOLN
10.0000 mg | Freq: Once | INTRAVENOUS | Status: AC
  Administered 2022-03-07: 10 mg via INTRAVENOUS
  Filled 2022-03-07: qty 1

## 2022-03-07 NOTE — Progress Notes (Signed)
[facility] OFFICE PROGRESS NOTE  Patient Care Team: Lafonda Mosses, MD as PCP - General (Gynecologic Oncology) Owens Loffler, MD as Consulting Physician [facility])  ASSESSMENT & PLAN:  Carcinosarcoma of body of uterus [facility]) So far, she tolerated cycle 1 of treatment well Her surgical incision is completely healed Her treatment was only complicated by brief course of arthralgias which resolved with tramadol We will proceed with cycle 2 without delay   Anemia in chronic kidney disease Her blood count is much improved She is not symptomatic Observe only  Arthralgia She has chronic arthralgia, exacerbated by recent chemotherapy She can continue tramadol as needed I reminded her of refill policy  No orders of the defined types were placed in this encounter.   All questions were answered. The patient knows to call the clinic with any problems, questions or concerns. The total time spent in the appointment was 20 minutes encounter with patients including review of chart and various tests results, discussions about plan of care and coordination of care plan   Heath Lark, MD 03/07/2022 8:24 AM  INTERVAL HISTORY: Please see below for problem oriented charting. she returns for treatment follow-up, seen prior to cycle 2 of treatment She tolerated cycle 1 of treatment well She had brief arthralgias that resolved with tramadol  REVIEW OF SYSTEMS:   Constitutional: Denies fevers, chills or abnormal weight loss Eyes: Denies blurriness of vision Ears, nose, mouth, throat, and face: Denies mucositis or sore throat Respiratory: Denies cough, dyspnea or wheezes Cardiovascular: Denies palpitation, chest discomfort or lower extremity swelling Gastrointestinal:  Denies nausea, heartburn or change in bowel habits Skin: Denies abnormal skin rashes Lymphatics: Denies new lymphadenopathy or easy bruising Neurological:Denies numbness, tingling or new  weaknesses Behavioral/Psych: Mood is stable, no new changes  All other systems were reviewed with the patient and are negative.  I have reviewed the past medical history, past surgical history, social history and family history with the patient and they are unchanged from previous note.  ALLERGIES:  is allergic to tylenol [acetaminophen] and latex.  MEDICATIONS:  Current Outpatient Medications  Medication Sig Dispense Refill   Cyanocobalamin (VITAMIN B 12 PO) Take 1 tablet by mouth.     dexamethasone (DECADRON) 4 MG tablet Take 2 tabs at the night before and 2 tab the morning of chemotherapy, every 3 weeks, by mouth x 6 cycles 24 tablet 6   escitalopram (LEXAPRO) 10 MG tablet Take 1 tablet (10 mg total) by mouth daily. 90 tablet 3   lidocaine-prilocaine (EMLA) cream Apply to affected area once 30 g 3   Magnesium 200 MG TABS Take 2 tablets by mouth daily.     ondansetron (ZOFRAN) 8 MG tablet Take 1 tablet (8 mg total) by mouth every 8 (eight) hours as needed for nausea or vomiting. Start on the third day after chemotherapy. 30 tablet 1   pantoprazole (PROTONIX) 40 MG tablet TAKE 1 TABLET BY MOUTH TWICE A DAY 180 tablet 3   prochlorperazine (COMPAZINE) 10 MG tablet Take 1 tablet (10 mg total) by mouth every 6 (six) hours as needed for nausea or vomiting. 30 tablet 1   VITAMIN D, CHOLECALCIFEROL, PO Take 1 tablet by mouth daily.     No current facility-administered medications for this visit.    SUMMARY OF ONCOLOGIC HISTORY: Oncology History Overview Note  MMR normal MSI stable PD-L1 CPS: 1% Her2 positive P53 + carcinosarcoma   Carcinosarcoma of body of uterus ([place])  09/23/2021 Initial Diagnosis   She  presented with PMB   09/28/2021 Imaging   US pelvis Enlarged uterus with fibroids. Pelvic sonogram is otherwise unremarkable.    11/11/2021 Pathology Results   A. CERVIX, MASS, EXCISION:  - HIGH-GRADE UNDIFFERENTIATED MALIGNANCY WITH EXTENSIVE NECROSIS (SEE COMMENT).   COMMENT:   The tumor consists of mostly high-grade sarcomatoid morphology with focal epithelioid morphology.  Immunohistochemical stains are performed on block A4.  The tumor cells are positive for desmin and p16 while negative for CD45, CK7, CK20, chromogranin, synaptophysin, Melan-A, p40, AE1/AE3, SMA, p63, cyclin D1, S100 and smooth muscle myosin.  Ki-67 proliferation index is high.  CD10 staining shows focal nonspecific staining.  The differential diagnosis includes a high-grade sarcoma and a partially sampled carcinosarcoma.    11/25/2021 Imaging   MRI pelvis Marked distention of the entire endometrial cavity by heterogeneously enhancing soft tissue density, which extends through the endocervical canal into the lower vagina. This is highly suspicious for endometrial carcinoma.   Diffuse myometrial thinning due to marked distention of endometrial cavity limits evaluation; deep myometrial invasion cannot be excluded in the uterine fundus.   No evidence of extra-uterine extension of tumor.   Lymphadenopathy in lower abdominal retroperitoneum, bilateral iliac chains, and sigmoid mesocolon, consistent with metastatic disease.   Sigmoid diverticulosis. No radiographic evidence of diverticulitis.   11/26/2021 Initial Diagnosis   Uterine cancer ([place])   11/26/2021 Cancer Staging   Staging form: Corpus Uteri - Carcinoma and Carcinosarcoma, AJCC 8th Edition - Clinical stage from 11/26/2021: FIGO Stage IIIC2 (cT3, cN2, cM0) - Signed by Heath Lark, MD on 11/26/2021 Stage prefix: Initial diagnosis Histologic grade (G): G3 Histologic grading system: 3 grade system   11/30/2021 Pathology Results   FINAL MICROSCOPIC DIAGNOSIS:  A. UTERINE CONTENTS, BIOPSY: - HIGH-GRADE UNDIFFERENTIATED MALIGNANCY WITH EXTENSIVE NECROSIS (SEE COMMENT).  COMMENT: The patient's history of a high-grade undifferentiated malignancy with extensive necrosis of the cervix is noted.  The morphological features of the current tumor  cells are similar to the tumor cells seen in the previous cervical mass excision.  Immunohistochemical staining for desmin and cytokeratin AE1/AE3 is performed on block A1.  The tumor cells are diffusely positive for desmin.   AE1/AE3 stain is focally positive, however this is scant and of unclear clinical significance. Given the small quantity of tissue in the current biopsy, a definitive distinction between a high-grade sarcoma and carcinosarcoma cannot be made as this biopsy may not be representative of the entire tumor. If clinically indicated, this case as well as the previous cervical mass excision [id number]) may be sent out for expert consultation. Clinical correlation recommended.     12/01/2021 Tumor Marker   Patient's tumor was tested for the following markers: CA-125. Results of the tumor marker test revealed 491.   12/22/2021 Surgery   Robotic-assisted laparoscopic total hysterectomy with bilateral salpingo-oophorectomy, tumor debulking including high common iliac lymph node excision, aborted attempt of bilateral external iliac enlarged lymph nodes, mini-lap for specimen delivery, cystoscopy    12/22/2021 Pathology Results   FINAL MICROSCOPIC DIAGNOSIS:  A. LEFT CERVICAL MARGIN, EXCISION: - Positive for carcinoma  B. HIGH LEFT COMMON ILIAC LYMPH NODE, EXCISION: - Metastatic carcinosarcoma involving the lymph node  C. 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, including undifferentiated sarcoma with rhabdoid features and high-grade serous carcinoma, see comment - Tumor invades for a depth of 1 mm where myometrial thickness is 5 mm - Benign bilateral fallopian tubes and ovaries - See oncology table - See comment  ONCOLOGY TABLE:  UTERUS, CARCINOMA OR CARCINOSARCOMA: Resection  Procedure: Total hysterectomy and bilateral salpingo-oophorectomy Histologic Type: Carcinosarcoma (mixed  malignant Mullerian tumor) Histologic Grade: High-grade Myometrial Invasion:      Depth of Myometrial Invasion (mm): 1 mm      Myometrial Thickness (mm): 5 mm      Percentage of Myometrial Invasion: 20% Uterine Serosa Involvement: Not identified Cervical stromal Involvement: Present Extent of involvement of other tissue/organs: Not identified Peritoneal/Ascitic Fluid: Not applicable Lymphovascular Invasion: Present Regional Lymph Nodes:      Pelvic Lymph Nodes Examined:                                  0 Sentinel                                  1 Non-sentinel                                  1 Total      Pelvic Lymph Nodes with Metastasis: 1                          Macrometastasis: (>2.0 mm): 1                          Micrometastasis: (>0.2 mm and < 2.0 mm): 0                          Isolated Tumor Cells (<0.2 mm): 0                          Laterality of Lymph Node with Tumor: Left                          Extracapsular Extension: Not identified      Para-aortic Lymph Nodes Examined:                                   0 Sentinel                                   0 non-sentinel                                   0 total Distant Metastasis:      Distant Site(s) Involved: Not applicable Pathologic Stage Classification (pTNM, AJCC 8th Edition): pT1a, pN1 Ancillary Studies: MMR / MSI testing will be ordered Representative Tumor Block: C1 Comment(s): None  ([id number])       01/13/2022 Procedure   Placement of single lumen port a cath via right internal jugular vein. The catheter tip lies at the cavo-atrial junction. A power injectable port a cath was placed and is ready for immediate use.     01/19/2022 Echocardiogram    1. Left ventricular ejection fraction, by estimation, is 60 to 65%. The left ventricle has normal function. The left ventricle has no regional wall motion abnormalities.  Left ventricular diastolic parameters are consistent with Grade I diastolic dysfunction  (impaired relaxation). The average left ventricular global longitudinal strain is -24.0 %. The global longitudinal strain is normal.  2. Right ventricular systolic function is normal. The right ventricular size is normal. There is normal pulmonary artery systolic pressure.  3. Left atrial size was mildly dilated.  4. The mitral valve is normal in structure. No evidence of mitral valve regurgitation. No evidence of mitral stenosis.  5. The aortic valve was not well visualized. Aortic valve regurgitation is not visualized. Aortic valve sclerosis is present, with no evidence of aortic valve stenosis.  6. The inferior vena cava is normal in size with greater than 50% respiratory variability, suggesting right atrial pressure of 3 mmHg.     01/27/2022 Tumor Marker   Patient's tumor was tested for the following markers: CA-125. Results of the tumor marker test revealed 307.   02/14/2022 -  Chemotherapy   Patient is on Treatment Plan : UTERINE SEROUS CARCINOMA Carboplatin + Paclitaxel + Trastuzumab q21d x 6 Cycles / Trastuzumab q21d     02/15/2022 Tumor Marker   Patient's tumor was tested for the following markers: CA-125. Results of the tumor marker test revealed 161.     PHYSICAL EXAMINATION: ECOG PERFORMANCE STATUS: 1 - Symptomatic but completely ambulatory  Vitals:   03/07/22 0808  BP: (!) 178/91  Pulse: 95  Resp: 18  Temp: 97.9 F (36.6 C)  SpO2: 99%   Filed Weights   03/07/22 0808  Weight: 148 lb 3.2 oz (67.2 kg)    GENERAL:alert, no distress and comfortable SKIN: skin color, texture, turgor are normal, no rashes or significant lesions EYES: normal, Conjunctiva are pink and non-injected, sclera clear OROPHARYNX:no exudate, no erythema and lips, buccal mucosa, and tongue normal  NECK: supple, thyroid normal size, non-tender, without nodularity LYMPH:  no palpable lymphadenopathy in the cervical, axillary or inguinal LUNGS: clear to auscultation and percussion with normal breathing  effort HEART: regular rate & rhythm and no murmurs and no lower extremity edema ABDOMEN:abdomen soft, non-tender and normal bowel sounds.  Her incision is well-healed Musculoskeletal:no cyanosis of digits and no clubbing  NEURO: alert & oriented x 3 with fluent speech, no focal motor/sensory deficits  LABORATORY DATA:  I have reviewed the data as listed    Component Value Date/Time   NA 134 (L) 02/14/2022 0803   NA 140 11/02/2016 1130   K 3.9 02/14/2022 0803   K 3.8 11/02/2016 1130   CL 101 02/14/2022 0803   CL 105 05/02/2012 0822   CO2 23 02/14/2022 0803   CO2 26 11/02/2016 1130   GLUCOSE 151 (H) 02/14/2022 0803   GLUCOSE 87 11/02/2016 1130   GLUCOSE 98 05/02/2012 0822   BUN 27 (H) 02/14/2022 0803   BUN 14.7 11/02/2016 1130   CREATININE 1.03 (H) 02/14/2022 0803   CREATININE 0.84 12/05/2018 1014   CREATININE 0.8 11/02/2016 1130   CALCIUM 9.4 02/14/2022 0803   CALCIUM 9.7 11/02/2016 1130   PROT 7.2 02/14/2022 0803   PROT 6.8 11/02/2016 1130   ALBUMIN 3.6 02/14/2022 0803   ALBUMIN 3.6 11/02/2016 1130   AST 17 02/14/2022 0803   AST 23 11/02/2016 1130   ALT 14 02/14/2022 0803   ALT 17 11/02/2016 1130   ALKPHOS 89 02/14/2022 0803   ALKPHOS 59 11/02/2016 1130   BILITOT 0.3 02/14/2022 0803   BILITOT 0.34 11/02/2016 1130   GFRNONAA 57 (L) 02/14/2022 0803   GFRAA >60 11/08/2017 0504   GFRAA >  60 07/25/2017 1425    No results found for: "SPEP", "UPEP"  Lab Results  Component Value Date   WBC 4.8 03/07/2022   NEUTROABS 3.6 03/07/2022   HGB 11.1 (L) 03/07/2022   HCT 33.7 (L) 03/07/2022   MCV 91.3 03/07/2022   PLT 183 03/07/2022      Chemistry      Component Value Date/Time   NA 134 (L) 02/14/2022 0803   NA 140 11/02/2016 1130   K 3.9 02/14/2022 0803   K 3.8 11/02/2016 1130   CL 101 02/14/2022 0803   CL 105 05/02/2012 0822   CO2 23 02/14/2022 0803   CO2 26 11/02/2016 1130   BUN 27 (H) 02/14/2022 0803   BUN 14.7 11/02/2016 1130   CREATININE 1.03 (H) 02/14/2022  0803   CREATININE 0.84 12/05/2018 1014   CREATININE 0.8 11/02/2016 1130      Component Value Date/Time   CALCIUM 9.4 02/14/2022 0803   CALCIUM 9.7 11/02/2016 1130   ALKPHOS 89 02/14/2022 0803   ALKPHOS 59 11/02/2016 1130   AST 17 02/14/2022 0803   AST 23 11/02/2016 1130   ALT 14 02/14/2022 0803   ALT 17 11/02/2016 1130   BILITOT 0.3 02/14/2022 0803   BILITOT 0.34 11/02/2016 1130

## 2022-03-07 NOTE — Assessment & Plan Note (Signed)
Her blood count is much improved She is not symptomatic Observe only

## 2022-03-07 NOTE — Patient Instructions (Signed)
[facility]  Discharge Instructions: Thank you for choosing [facility] to provide your oncology and hematology care.   If you have a lab appointment with the [facility], please go directly to the [facility] and check in at the registration area.   Wear comfortable clothing and clothing appropriate for easy access to any Portacath or PICC line.   We strive to give you quality time with your provider. You may need to reschedule your appointment if you arrive late (15 or more minutes).  Arriving late affects you and other patients whose appointments are after yours.  Also, if you miss three or more appointments without notifying the office, you may be dismissed from the clinic at the provider's discretion.      For prescription refill requests, have your pharmacy contact our office and allow 72 hours for refills to be completed.    Today you received the following chemotherapy and/or immunotherapy agents: Trastuzumab, Paclitaxel, Carboplatin      To help prevent nausea and vomiting after your treatment, we encourage you to take your nausea medication as directed.  BELOW ARE SYMPTOMS THAT SHOULD BE REPORTED IMMEDIATELY: *FEVER GREATER THAN 100.4 F (38 C) OR HIGHER *CHILLS OR SWEATING *NAUSEA AND VOMITING THAT IS NOT CONTROLLED WITH YOUR NAUSEA MEDICATION *UNUSUAL SHORTNESS OF BREATH *UNUSUAL BRUISING OR BLEEDING *URINARY PROBLEMS (pain or burning when urinating, or frequent urination) *BOWEL PROBLEMS (unusual diarrhea, constipation, pain near the anus) TENDERNESS IN MOUTH AND THROAT WITH OR WITHOUT PRESENCE OF ULCERS (sore throat, sores in mouth, or a toothache) UNUSUAL RASH, SWELLING OR PAIN  UNUSUAL VAGINAL DISCHARGE OR ITCHING   Items with * indicate a potential emergency and should be followed up as soon as possible or go to the Emergency Department if any problems should occur.  Please show the CHEMOTHERAPY ALERT CARD or  IMMUNOTHERAPY ALERT CARD at check-in to the Emergency Department and triage nurse.  Should you have questions after your visit or need to cancel or reschedule your appointment, please contact [facility]  Dept: [phone]  and follow the prompts.  Office hours are 8:00 a.m. to 4:30 p.m. Monday - Friday. Please note that voicemails left after 4:00 p.m. may not be returned until the following business day.  We are closed weekends and major holidays. You have access to a nurse at all times for urgent questions. Please call the main number to the clinic Dept: [phone] and follow the prompts.   For any non-urgent questions, you may also contact your provider using MyChart. We now offer e-Visits for anyone 10 and older to request care online for non-urgent symptoms. For details visit [email or web address].   Also download the MyChart app! Go to the app store, search "MyChart", open the app, select , and log in with your MyChart username and password.

## 2022-03-07 NOTE — Progress Notes (Signed)
[facility] CSW Progress Note  Holiday representative met with patient to check on coping during infusion. Pt reports to be doing fairly well overall. She is interested in finding more head coverings or wigs- discussed options today. Also provided March support program calendar.   Encouraged pt to call with any future support needs.    Aarini Slee E Akia Montalban, LCSW

## 2022-03-07 NOTE — Assessment & Plan Note (Signed)
She has chronic arthralgia, exacerbated by recent chemotherapy She can continue tramadol as needed I reminded her of refill policy

## 2022-03-07 NOTE — Assessment & Plan Note (Signed)
So far, she tolerated cycle 1 of treatment well Her surgical incision is completely healed Her treatment was only complicated by brief course of arthralgias which resolved with tramadol We will proceed with cycle 2 without delay

## 2022-03-09 ENCOUNTER — Other Ambulatory Visit: Payer: Self-pay | Admitting: Nurse Practitioner

## 2022-03-09 DIAGNOSIS — F411 Generalized anxiety disorder: Secondary | ICD-10-CM

## 2022-03-10 ENCOUNTER — Telehealth: Payer: Self-pay

## 2022-03-10 ENCOUNTER — Other Ambulatory Visit: Payer: Self-pay | Admitting: Hematology and Oncology

## 2022-03-10 MED ORDER — TRAMADOL HCL 50 MG PO TABS: 50.0000 mg | ORAL_TABLET | Freq: Four times a day (QID) | ORAL | 0 refills | Status: DC | PRN

## 2022-03-10 NOTE — Telephone Encounter (Signed)
done 

## 2022-03-10 NOTE — Telephone Encounter (Signed)
Called and left a message Tramadol sent to [facility]. Ask her to call the office back for questions.

## 2022-03-10 NOTE — Telephone Encounter (Signed)
She called and left a message requesting refill on Tr[facility]adol [facility] to [facility] in [place]. She had to take 2 tramadol yesterday for knee pain and will need a refill.

## 2022-03-25 MED FILL — Fosaprepitant Dimeglumine For IV Infusion 150 MG (Base Eq): INTRAVENOUS | Qty: 5 | Status: AC

## 2022-03-25 MED FILL — Dexamethasone Sodium Phosphate Inj 100 MG/10ML: INTRAMUSCULAR | Qty: 1 | Status: AC

## 2022-03-28 ENCOUNTER — Encounter: Payer: Self-pay | Admitting: Hematology and Oncology

## 2022-03-28 ENCOUNTER — Telehealth: Payer: Self-pay

## 2022-03-28 ENCOUNTER — Inpatient Hospital Stay: Payer: Medicare HMO

## 2022-03-28 ENCOUNTER — Other Ambulatory Visit: Payer: Self-pay

## 2022-03-28 ENCOUNTER — Inpatient Hospital Stay: Payer: Medicare HMO | Attending: Hematology and Oncology | Admitting: Hematology and Oncology

## 2022-03-28 VITALS — BP 135/67 | HR 80 | Resp 18

## 2022-03-28 VITALS — BP 165/80 | HR 99 | Temp 97.8°F | Resp 18 | Ht 59.5 in | Wt 144.6 lb

## 2022-03-28 DIAGNOSIS — Z5112 Encounter for antineoplastic immunotherapy: Secondary | ICD-10-CM | POA: Insufficient documentation

## 2022-03-28 DIAGNOSIS — C549 Malignant neoplasm of corpus uteri, unspecified: Secondary | ICD-10-CM

## 2022-03-28 DIAGNOSIS — C775 Secondary and unspecified malignant neoplasm of intrapelvic lymph nodes: Secondary | ICD-10-CM | POA: Insufficient documentation

## 2022-03-28 DIAGNOSIS — N183 Chronic kidney disease, stage 3 unspecified: Secondary | ICD-10-CM | POA: Diagnosis not present

## 2022-03-28 DIAGNOSIS — M2550 Pain in unspecified joint: Secondary | ICD-10-CM

## 2022-03-28 DIAGNOSIS — D631 Anemia in chronic kidney disease: Secondary | ICD-10-CM

## 2022-03-28 DIAGNOSIS — Z5111 Encounter for antineoplastic chemotherapy: Secondary | ICD-10-CM | POA: Diagnosis not present

## 2022-03-28 DIAGNOSIS — N189 Chronic kidney disease, unspecified: Secondary | ICD-10-CM | POA: Diagnosis not present

## 2022-03-28 LAB — CBC WITH DIFFERENTIAL (CANCER CENTER ONLY)
Abs Immature Granulocytes: 0.01 10*3/uL (ref 0.00–0.07)
Basophils Absolute: 0 10*3/uL (ref 0.0–0.1)
Basophils Relative: 1 %
Eosinophils Absolute: 0 10*3/uL (ref 0.0–0.5)
Eosinophils Relative: 0 %
HCT: 34 % — ABNORMAL LOW (ref 36.0–46.0)
Hemoglobin: 11.5 g/dL — ABNORMAL LOW (ref 12.0–15.0)
Immature Granulocytes: 0 %
Lymphocytes Relative: 16 %
Lymphs Abs: 0.7 10*3/uL (ref 0.7–4.0)
MCH: 30.5 pg (ref 26.0–34.0)
MCHC: 33.8 g/dL (ref 30.0–36.0)
MCV: 90.2 fL (ref 80.0–100.0)
Monocytes Absolute: 0.1 10*3/uL (ref 0.1–1.0)
Monocytes Relative: 2 %
Neutro Abs: 3.4 10*3/uL (ref 1.7–7.7)
Neutrophils Relative %: 81 %
Platelet Count: 200 10*3/uL (ref 150–400)
RBC: 3.77 MIL/uL — ABNORMAL LOW (ref 3.87–5.11)
RDW: 14.6 % (ref 11.5–15.5)
Smear Review: NORMAL
WBC Count: 4.1 10*3/uL (ref 4.0–10.5)
nRBC: 0 % (ref 0.0–0.2)

## 2022-03-28 LAB — CMP (CANCER CENTER ONLY)
ALT: 14 U/L (ref 0–44)
AST: 19 U/L (ref 15–41)
Albumin: 3.9 g/dL (ref 3.5–5.0)
Alkaline Phosphatase: 105 U/L (ref 38–126)
Anion gap: 9 (ref 5–15)
BUN: 22 mg/dL (ref 8–23)
CO2: 24 mmol/L (ref 22–32)
Calcium: 9.2 mg/dL (ref 8.9–10.3)
Chloride: 103 mmol/L (ref 98–111)
Creatinine: 1.16 mg/dL — ABNORMAL HIGH (ref 0.44–1.00)
GFR, Estimated: 49 mL/min — ABNORMAL LOW (ref >60)
Glucose, Bld: 172 mg/dL — ABNORMAL HIGH (ref 70–99)
Potassium: 4.1 mmol/L (ref 3.5–5.1)
Sodium: 136 mmol/L (ref 135–145)
Total Bilirubin: 0.3 mg/dL (ref 0.3–1.2)
Total Protein: 7.4 g/dL (ref 6.5–8.1)

## 2022-03-28 MED ORDER — SODIUM CHLORIDE 0.9 % IV SOLN: Freq: Once | INTRAVENOUS | Status: AC

## 2022-03-28 MED ORDER — SODIUM CHLORIDE 0.9 % IV SOLN
345.0000 mg | Freq: Once | INTRAVENOUS | Status: AC
  Administered 2022-03-28: 350 mg via INTRAVENOUS
  Filled 2022-03-28: qty 35

## 2022-03-28 MED ORDER — SODIUM CHLORIDE 0.9% FLUSH
10.0000 mL | Freq: Once | INTRAVENOUS | Status: AC
  Administered 2022-03-28: 10 mL

## 2022-03-28 MED ORDER — HEPARIN SOD (PORK) LOCK FLUSH 100 UNIT/ML IV SOLN
500.0000 [IU] | Freq: Once | INTRAVENOUS | Status: AC | PRN
  Administered 2022-03-28: 500 [IU]

## 2022-03-28 MED ORDER — PALONOSETRON HCL INJECTION 0.25 MG/5ML
0.2500 mg | Freq: Once | INTRAVENOUS | Status: AC
  Administered 2022-03-28: 0.25 mg via INTRAVENOUS
  Filled 2022-03-28: qty 5

## 2022-03-28 MED ORDER — SODIUM CHLORIDE 0.9 % IV SOLN
150.0000 mg | Freq: Once | INTRAVENOUS | Status: AC
  Administered 2022-03-28: 150 mg via INTRAVENOUS
  Filled 2022-03-28: qty 150

## 2022-03-28 MED ORDER — SODIUM CHLORIDE 0.9 % IV SOLN
175.0000 mg/m2 | Freq: Once | INTRAVENOUS | Status: AC
  Administered 2022-03-28: 294 mg via INTRAVENOUS
  Filled 2022-03-28: qty 49

## 2022-03-28 MED ORDER — FAMOTIDINE IN NACL 20-0.9 MG/50ML-% IV SOLN
20.0000 mg | Freq: Once | INTRAVENOUS | Status: AC
  Administered 2022-03-28: 20 mg via INTRAVENOUS
  Filled 2022-03-28: qty 50

## 2022-03-28 MED ORDER — CETIRIZINE HCL 10 MG/ML IV SOLN
10.0000 mg | Freq: Once | INTRAVENOUS | Status: AC
  Administered 2022-03-28: 10 mg via INTRAVENOUS
  Filled 2022-03-28: qty 1

## 2022-03-28 MED ORDER — SODIUM CHLORIDE 0.9 % IV SOLN
10.0000 mg | Freq: Once | INTRAVENOUS | Status: AC
  Administered 2022-03-28: 10 mg via INTRAVENOUS
  Filled 2022-03-28: qty 10

## 2022-03-28 MED ORDER — SODIUM CHLORIDE 0.9% FLUSH
10.0000 mL | INTRAVENOUS | Status: DC | PRN
  Administered 2022-03-28: 10 mL

## 2022-03-28 MED ORDER — TRASTUZUMAB-ANNS CHEMO 420 MG IV SOLR
6.0000 mg/kg | Freq: Once | INTRAVENOUS | Status: AC
  Administered 2022-03-28: 420 mg via INTRAVENOUS
  Filled 2022-03-28: qty 20

## 2022-03-28 NOTE — Telephone Encounter (Signed)
Called and left a message with Echo appt on 4/3 at [facility] at 9 am, arrive at [facility]. Ask her to call the office back with questions.

## 2022-03-28 NOTE — Assessment & Plan Note (Signed)
Her blood count is much improved She is not symptomatic Observe only 

## 2022-03-28 NOTE — Patient Instructions (Signed)
[facility]  Discharge Instructions: Thank you for choosing [facility] to provide your oncology and hematology care.   If you have a lab appointment with the Cancer Center, please go directly to the Cancer Center and check in at the registration area.   Wear comfortable clothing and clothing appropriate for easy access to any Portacath or PICC line.   We strive to give you quality time with your provider. You may need to reschedule your appointment if you arrive late (15 or more minutes).  Arriving late affects you and other patients whose appointments are after yours.  Also, if you miss three or more appointments without notifying the office, you may be dismissed from the clinic at the provider's discretion.      For prescription refill requests, have your pharmacy contact our office and allow 72 hours for refills to be completed.    Today you received the following chemotherapy and/or immunotherapy agents: Trastuzumab, Paclitaxel, Carboplatin      To help prevent nausea and vomiting after your treatment, we encourage you to take your nausea medication as directed.  BELOW ARE SYMPTOMS THAT SHOULD BE REPORTED IMMEDIATELY: *FEVER GREATER THAN 100.4 F (38 C) OR HIGHER *CHILLS OR SWEATING *NAUSEA AND VOMITING THAT IS NOT CONTROLLED WITH YOUR NAUSEA MEDICATION *UNUSUAL SHORTNESS OF BREATH *UNUSUAL BRUISING OR BLEEDING *URINARY PROBLEMS (pain or burning when urinating, or frequent urination) *BOWEL PROBLEMS (unusual diarrhea, constipation, pain near the anus) TENDERNESS IN MOUTH AND THROAT WITH OR WITHOUT PRESENCE OF ULCERS (sore throat, sores in mouth, or a toothache) UNUSUAL RASH, SWELLING OR PAIN  UNUSUAL VAGINAL DISCHARGE OR ITCHING   Items with * indicate a potential emergency and should be followed up as soon as possible or go to the Emergency Department if any problems should occur.  Please show the CHEMOTHERAPY ALERT CARD or  IMMUNOTHERAPY ALERT CARD at check-in to the Emergency Department and triage nurse.  Should you have questions after your visit or need to cancel or reschedule your appointment, please contact [facility]  Dept: [phone]  and follow the prompts.  Office hours are 8:00 a.m. to 4:30 p.m. Monday - Friday. Please note that voicemails left after 4:00 p.m. may not be returned until the following business day.  We are closed weekends and major holidays. You have access to a nurse at all times for urgent questions. Please call the main number to the clinic Dept: [phone] and follow the prompts.   For any non-urgent questions, you may also contact your provider using MyChart. We now offer e-Visits for anyone 18 and older to request care online for non-urgent symptoms. For details visit [email or web address].   Also download the MyChart app! Go to the app store, search "MyChart", open the app, select [facility], and log in with your MyChart username and password.   

## 2022-03-28 NOTE — Assessment & Plan Note (Signed)
She has chronic arthralgia, exacerbated by recent chemotherapy She can continue tramadol as needed

## 2022-03-28 NOTE — Assessment & Plan Note (Addendum)
So far, she tolerated treatment well Her surgical incision is completely healed Her treatment was only complicated by brief course of arthralgias  We will proceed with cycle 3 without delay I will order echocardiogram to be done prior to her next visit

## 2022-03-28 NOTE — Progress Notes (Signed)
[facility] OFFICE PROGRESS NOTE  Patient Care Team: Lafonda Mosses, MD as PCP - General (Gynecologic Oncology) Owens Loffler, MD as Consulting Physician [facility])  ASSESSMENT & PLAN:  Carcinosarcoma of body of uterus [facility]) So far, she tolerated treatment well Her surgical incision is completely healed Her treatment was only complicated by brief course of arthralgias  We will proceed with cycle 3 without delay I will order echocardiogram to be done prior to her next visit   Arthralgia She has chronic arthralgia, exacerbated by recent chemotherapy She can continue tramadol as needed  Anemia in chronic kidney disease Her blood count is much improved She is not symptomatic Observe only  Orders Placed This Encounter  Procedures   ECHOCARDIOGRAM COMPLETE    Standing Status:   Future    Standing Expiration Date:   03/28/2023    Order Specific Question:   Where should this test be performed    Answer:   [facility]    Order Specific Question:   Perflutren DEFINITY (image enhancing agent) should be administered unless hypersensitivity or allergy exist    Answer:   Administer Perflutren    Order Specific Question:   Reason for exam-Echo    Answer:   Chemo  Z09    All questions were answered. The patient knows to call the clinic with any problems, questions or concerns. The total time spent in the appointment was 25 minutes encounter with patients including review of chart and various tests results, discussions about plan of care and coordination of care plan   Heath Lark, MD 03/28/2022 9:43 AM  INTERVAL HISTORY: Please see below for problem oriented charting. she returns for treatment follow-up seen prior to cycle 3 of treatment She tolerated recent treatment well She has mild intermittent arthralgias  REVIEW OF SYSTEMS:   Constitutional: Denies fevers, chills or abnormal weight loss Eyes: Denies blurriness of vision Ears, nose, mouth, throat,  and face: Denies mucositis or sore throat Respiratory: Denies cough, dyspnea or wheezes Cardiovascular: Denies palpitation, chest discomfort or lower extremity swelling Gastrointestinal:  Denies nausea, heartburn or change in bowel habits Skin: Denies abnormal skin rashes Lymphatics: Denies new lymphadenopathy or easy bruising Neurological:Denies numbness, tingling or new weaknesses Behavioral/Psych: Mood is stable, no new changes  All other systems were reviewed with the patient and are negative.  I have reviewed the past medical history, past surgical history, social history and family history with the patient and they are unchanged from previous note.  ALLERGIES:  is allergic to tylenol [acetaminophen] and latex.  MEDICATIONS:  Current Outpatient Medications  Medication Sig Dispense Refill   Cyanocobalamin (VITAMIN B 12 PO) Take 1 tablet by mouth.     dexamethasone (DECADRON) 4 MG tablet Take 2 tabs at the night before and 2 tab the morning of chemotherapy, every 3 weeks, by mouth x 6 cycles 24 tablet 6   escitalopram (LEXAPRO) 10 MG tablet TAKE 1 TABLET EVERY DAY 90 tablet 3   lidocaine-prilocaine (EMLA) cream Apply to affected area once 30 g 3   Magnesium 200 MG TABS Take 2 tablets by mouth daily.     ondansetron (ZOFRAN) 8 MG tablet Take 1 tablet (8 mg total) by mouth every 8 (eight) hours as needed for nausea or vomiting. Start on the third day after chemotherapy. 30 tablet 1   pantoprazole (PROTONIX) 40 MG tablet TAKE 1 TABLET BY MOUTH TWICE A DAY 180 tablet 3   prochlorperazine (COMPAZINE) 10 MG tablet Take 1 tablet (10  mg total) by mouth every 6 (six) hours as needed for nausea or vomiting. 30 tablet 1   traMADol (ULTRAM) 50 MG tablet Take 1 tablet (50 mg total) by mouth every 6 (six) hours as needed. 30 tablet 0   VITAMIN D, CHOLECALCIFEROL, PO Take 1 tablet by mouth daily.     No current facility-administered medications for this visit.   Facility-Administered Medications  Ordered in Other Visits  Medication Dose Route Frequency Provider Last Rate Last Admin   CARBOplatin (PARAPLATIN) 350 mg in sodium chloride 0.9 % 100 mL chemo infusion  350 mg Intravenous Once Alvy Bimler, Bretton Tandy, MD       famotidine (PEPCID) IVPB 20 mg premix  20 mg Intravenous Once Alvy Bimler, Anna Beaird, MD       PACLitaxel (TAXOL) 294 mg in sodium chloride 0.9 % 250 mL chemo infusion (> 80mg /m2)  175 mg/m2 (Treatment Plan Recorded) Intravenous Once Alvy Bimler, Jessen Siegman, MD       palonosetron (ALOXI) injection 0.25 mg  0.25 mg Intravenous Once Alvy Bimler, Xavi Tomasik, MD       trastuzumab-anns (KANJINTI) 420 mg in sodium chloride 0.9 % 250 mL chemo infusion  6 mg/kg (Treatment Plan Recorded) Intravenous Once Heath Lark, MD        SUMMARY OF ONCOLOGIC HISTORY: Oncology History Overview Note  MMR normal MSI stable PD-L1 CPS: 1% Her2 positive P53 + carcinosarcoma   Carcinosarcoma of body of uterus ([place])  09/23/2021 Initial Diagnosis   She presented with PMB   09/28/2021 Imaging   US pelvis Enlarged uterus with fibroids. Pelvic sonogram is otherwise unremarkable.    11/11/2021 Pathology Results   A. CERVIX, MASS, EXCISION:  - HIGH-GRADE UNDIFFERENTIATED MALIGNANCY WITH EXTENSIVE NECROSIS (SEE COMMENT).   COMMENT:  The tumor consists of mostly high-grade sarcomatoid morphology with focal epithelioid morphology.  Immunohistochemical stains are performed on block A4.  The tumor cells are positive for desmin and p16 while negative for CD45, CK7, CK20, chromogranin, synaptophysin, Melan-A, p40, AE1/AE3, SMA, p63, cyclin D1, S100 and smooth muscle myosin.  Ki-67 proliferation index is high.  CD10 staining shows focal nonspecific staining.  The differential diagnosis includes a high-grade sarcoma and a partially sampled carcinosarcoma.    11/25/2021 Imaging   MRI pelvis Marked distention of the entire endometrial cavity by heterogeneously enhancing soft tissue density, which extends through the endocervical canal into the lower  vagina. This is highly suspicious for endometrial carcinoma.   Diffuse myometrial thinning due to marked distention of endometrial cavity limits evaluation; deep myometrial invasion cannot be excluded in the uterine fundus.   No evidence of extra-uterine extension of tumor.   Lymphadenopathy in lower abdominal retroperitoneum, bilateral iliac chains, and sigmoid mesocolon, consistent with metastatic disease.   Sigmoid diverticulosis. No radiographic evidence of diverticulitis.   11/26/2021 Initial Diagnosis   Uterine cancer ([place])   11/26/2021 Cancer Staging   Staging form: Corpus Uteri - Carcinoma and Carcinosarcoma, AJCC 8th Edition - Clinical stage from 11/26/2021: FIGO Stage IIIC2 (cT3, cN2, cM0) - Signed by Heath Lark, MD on 11/26/2021 Stage prefix: Initial diagnosis Histologic grade (G): G3 Histologic grading system: 3 grade system   11/30/2021 Pathology Results   FINAL MICROSCOPIC DIAGNOSIS:  A. UTERINE CONTENTS, BIOPSY: - HIGH-GRADE UNDIFFERENTIATED MALIGNANCY WITH EXTENSIVE NECROSIS (SEE COMMENT).  COMMENT: The patient's history of a high-grade undifferentiated malignancy with extensive necrosis of the cervix is noted.  The morphological features of the current tumor cells are similar to the tumor cells seen in the previous cervical mass excision.  Immunohistochemical staining for  desmin and cytokeratin AE1/AE3 is performed on block A1.  The tumor cells are diffusely positive for desmin.   AE1/AE3 stain is focally positive, however this is scant and of unclear clinical significance. Given the small quantity of tissue in the current biopsy, a definitive distinction between a high-grade sarcoma and carcinosarcoma cannot be made as this biopsy may not be representative of the entire tumor. If clinically indicated, this case as well as the previous cervical mass excision [id number]) may be sent out for expert consultation. Clinical correlation recommended.     12/01/2021 Tumor  Marker   Patient's tumor was tested for the following markers: CA-125. Results of the tumor marker test revealed 491.   12/22/2021 Surgery   Robotic-assisted laparoscopic total hysterectomy with bilateral salpingo-oophorectomy, tumor debulking including high common iliac lymph node excision, aborted attempt of bilateral external iliac enlarged lymph nodes, mini-lap for specimen delivery, cystoscopy    12/22/2021 Pathology Results   FINAL MICROSCOPIC DIAGNOSIS:  A. LEFT CERVICAL MARGIN, EXCISION: - Positive for carcinoma  B. HIGH LEFT COMMON ILIAC LYMPH NODE, EXCISION: - Metastatic carcinosarcoma involving the lymph node  C. 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, including undifferentiated sarcoma with rhabdoid features and high-grade serous carcinoma, see comment - Tumor invades for a depth of 1 mm where myometrial thickness is 5 mm - Benign bilateral fallopian tubes and ovaries - See oncology table - See comment  ONCOLOGY TABLE:  UTERUS, CARCINOMA OR CARCINOSARCOMA: Resection  Procedure: Total hysterectomy and bilateral salpingo-oophorectomy Histologic Type: Carcinosarcoma (mixed malignant Mullerian tumor) Histologic Grade: High-grade Myometrial Invasion:      Depth of Myometrial Invasion (mm): 1 mm      Myometrial Thickness (mm): 5 mm      Percentage of Myometrial Invasion: 20% Uterine Serosa Involvement: Not identified Cervical stromal Involvement: Present Extent of involvement of other tissue/organs: Not identified Peritoneal/Ascitic Fluid: Not applicable Lymphovascular Invasion: Present Regional Lymph Nodes:      Pelvic Lymph Nodes Examined:                                  0 Sentinel                                  1 Non-sentinel                                  1 Total      Pelvic Lymph Nodes with Metastasis: 1                          Macrometastasis: (>2.0 mm): 1                           Micrometastasis: (>0.2 mm and < 2.0 mm): 0                          Isolated Tumor Cells (<0.2 mm): 0                          Laterality of Lymph Node with Tumor: Left  Extracapsular Extension: Not identified      Para-aortic Lymph Nodes Examined:                                   0 Sentinel                                   0 non-sentinel                                   0 total Distant Metastasis:      Distant Site(s) Involved: Not applicable Pathologic Stage Classification (pTNM, AJCC 8th Edition): pT1a, pN1 Ancillary Studies: MMR / MSI testing will be ordered Representative Tumor Block: C1 Comment(s): None  (v4.2.0.1)       01/13/2022 Procedure   Placement of single lumen port a cath via right internal jugular vein. The catheter tip lies at the cavo-atrial junction. A power injectable port a cath was placed and is ready for immediate use.     01/19/2022 Echocardiogram    1. Left ventricular ejection fraction, by estimation, is 60 to 65%. The left ventricle has normal function. The left ventricle has no regional wall motion abnormalities. Left ventricular diastolic parameters are consistent with Grade I diastolic dysfunction (impaired relaxation). The average left ventricular global longitudinal strain is -24.0 %. The global longitudinal strain is normal.  2. Right ventricular systolic function is normal. The right ventricular size is normal. There is normal pulmonary artery systolic pressure.  3. Left atrial size was mildly dilated.  4. The mitral valve is normal in structure. No evidence of mitral valve regurgitation. No evidence of mitral stenosis.  5. The aortic valve was not well visualized. Aortic valve regurgitation is not visualized. Aortic valve sclerosis is present, with no evidence of aortic valve stenosis.  6. The inferior vena cava is normal in size with greater than 50% respiratory variability, suggesting right atrial pressure of 3  mmHg.     01/27/2022 Tumor Marker   Patient's tumor was tested for the following markers: CA-125. Results of the tumor marker test revealed 307.   02/14/2022 -  Chemotherapy   Patient is on Treatment Plan : UTERINE SEROUS CARCINOMA Carboplatin + Paclitaxel + Trastuzumab q21d x 6 Cycles / Trastuzumab q21d     02/15/2022 Tumor Marker   Patient's tumor was tested for the following markers: CA-125. Results of the tumor marker test revealed 161.     PHYSICAL EXAMINATION: ECOG PERFORMANCE STATUS: 1 - Symptomatic but completely ambulatory  Vitals:   03/28/22 0827  BP: (!) 165/80  Pulse: 99  Resp: 18  Temp: 97.8 F (36.6 C)  SpO2: 99%   Filed Weights   03/28/22 0827  Weight: 144 lb 9.6 oz (65.6 kg)    GENERAL:alert, no distress and comfortable  NEURO: alert & oriented x 3 with fluent speech, no focal motor/sensory deficits  LABORATORY DATA:  I have reviewed the data as listed    Component Value Date/Time   NA 136 03/28/2022 0805   NA 140 11/02/2016 1130   K 4.1 03/28/2022 0805   K 3.8 11/02/2016 1130   CL 103 03/28/2022 0805   CL 105 05/02/2012 0822   CO2 24 03/28/2022 0805   CO2 26 11/02/2016 1130   GLUCOSE 172 (H) 03/28/2022 0805  GLUCOSE 87 11/02/2016 1130   GLUCOSE 98 05/02/2012 0822   BUN 22 03/28/2022 0805   BUN 14.7 11/02/2016 1130   CREATININE 1.16 (H) 03/28/2022 0805   CREATININE 0.84 12/05/2018 1014   CREATININE 0.8 11/02/2016 1130   CALCIUM 9.2 03/28/2022 0805   CALCIUM 9.7 11/02/2016 1130   PROT 7.4 03/28/2022 0805   PROT 6.8 11/02/2016 1130   ALBUMIN 3.9 03/28/2022 0805   ALBUMIN 3.6 11/02/2016 1130   AST 19 03/28/2022 0805   AST 23 11/02/2016 1130   ALT 14 03/28/2022 0805   ALT 17 11/02/2016 1130   ALKPHOS 105 03/28/2022 0805   ALKPHOS 59 11/02/2016 1130   BILITOT 0.3 03/28/2022 0805   BILITOT 0.34 11/02/2016 1130   GFRNONAA 49 (L) 03/28/2022 0805   GFRAA >60 11/08/2017 0504   GFRAA >60 07/25/2017 1425    No results found for: "SPEP",  "UPEP"  Lab Results  Component Value Date   WBC 4.1 03/28/2022   NEUTROABS 3.4 03/28/2022   HGB 11.5 (L) 03/28/2022   HCT 34.0 (L) 03/28/2022   MCV 90.2 03/28/2022   PLT 200 03/28/2022      Chemistry      Component Value Date/Time   NA 136 03/28/2022 0805   NA 140 11/02/2016 1130   K 4.1 03/28/2022 0805   K 3.8 11/02/2016 1130   CL 103 03/28/2022 0805   CL 105 05/02/2012 0822   CO2 24 03/28/2022 0805   CO2 26 11/02/2016 1130   BUN 22 03/28/2022 0805   BUN 14.7 11/02/2016 1130   CREATININE 1.16 (H) 03/28/2022 0805   CREATININE 0.84 12/05/2018 1014   CREATININE 0.8 11/02/2016 1130      Component Value Date/Time   CALCIUM 9.2 03/28/2022 0805   CALCIUM 9.7 11/02/2016 1130   ALKPHOS 105 03/28/2022 0805   ALKPHOS 59 11/02/2016 1130   AST 19 03/28/2022 0805   AST 23 11/02/2016 1130   ALT 14 03/28/2022 0805   ALT 17 11/02/2016 1130   BILITOT 0.3 03/28/2022 0805   BILITOT 0.34 11/02/2016 1130

## 2022-03-28 NOTE — Telephone Encounter (Signed)
-----   Message from Heath Lark, MD sent at 03/28/2022  9:42 AM EDT ----- Regarding: echo Pls schedule echo to be done first week of April, not sooner

## 2022-03-29 LAB — CA 125: Cancer Antigen (CA) 125: 30 U/mL (ref 0.0–38.1)

## 2022-04-13 ENCOUNTER — Ambulatory Visit (HOSPITAL_COMMUNITY)
Admission: RE | Admit: 2022-04-13 | Discharge: 2022-04-13 | Disposition: A | Discharge status: Home / Self Care | Payer: Medicare HMO | Source: Ambulatory Visit | Attending: Hematology and Oncology | Admitting: Hematology and Oncology

## 2022-04-13 DIAGNOSIS — Z01818 Encounter for other preprocedural examination: Secondary | ICD-10-CM | POA: Diagnosis not present

## 2022-04-13 DIAGNOSIS — C549 Malignant neoplasm of corpus uteri, unspecified: Secondary | ICD-10-CM

## 2022-04-13 DIAGNOSIS — Z0189 Encounter for other specified special examinations: Secondary | ICD-10-CM

## 2022-04-13 DIAGNOSIS — I358 Other nonrheumatic aortic valve disorders: Secondary | ICD-10-CM | POA: Diagnosis not present

## 2022-04-13 DIAGNOSIS — E785 Hyperlipidemia, unspecified: Secondary | ICD-10-CM | POA: Insufficient documentation

## 2022-04-13 LAB — ECHOCARDIOGRAM COMPLETE
AR max vel: 0.9 cm2
AV Area VTI: 2.24 cm2
AV Area mean vel: 1.88 cm2
AV Mean grad: 5 mmHg
AV Peak grad: 10 mmHg
Ao pk vel: 1.58 m/s
Area-P 1/2: 6.6 cm2
S' Lateral: 2.4 cm
Single Plane A4C EF: 65.8 %

## 2022-04-13 NOTE — Progress Notes (Signed)
  Echocardiogram 2D Echocardiogram has been performed.  Kayla Mcgrath 04/13/2022, 9:37 AM

## 2022-04-15 MED FILL — Fosaprepitant Dimeglumine For IV Infusion 150 MG (Base Eq): INTRAVENOUS | Qty: 5 | Status: AC

## 2022-04-15 MED FILL — Dexamethasone Sodium Phosphate Inj 100 MG/10ML: INTRAMUSCULAR | Qty: 1 | Status: AC

## 2022-04-18 ENCOUNTER — Inpatient Hospital Stay: Payer: Medicare HMO | Attending: Hematology and Oncology

## 2022-04-18 ENCOUNTER — Encounter: Payer: Self-pay | Admitting: Hematology and Oncology

## 2022-04-18 ENCOUNTER — Inpatient Hospital Stay: Payer: Medicare HMO | Admitting: Hematology and Oncology

## 2022-04-18 ENCOUNTER — Inpatient Hospital Stay: Payer: Medicare HMO

## 2022-04-18 VITALS — BP 160/81 | HR 90 | Temp 97.9°F | Resp 18 | Ht 59.5 in | Wt 144.8 lb

## 2022-04-18 VITALS — BP 143/71 | HR 77 | Resp 18

## 2022-04-18 DIAGNOSIS — C549 Malignant neoplasm of corpus uteri, unspecified: Secondary | ICD-10-CM | POA: Insufficient documentation

## 2022-04-18 DIAGNOSIS — Z5112 Encounter for antineoplastic immunotherapy: Secondary | ICD-10-CM | POA: Insufficient documentation

## 2022-04-18 DIAGNOSIS — D631 Anemia in chronic kidney disease: Secondary | ICD-10-CM | POA: Diagnosis not present

## 2022-04-18 DIAGNOSIS — M2550 Pain in unspecified joint: Secondary | ICD-10-CM

## 2022-04-18 DIAGNOSIS — N189 Chronic kidney disease, unspecified: Secondary | ICD-10-CM | POA: Diagnosis not present

## 2022-04-18 DIAGNOSIS — Z5111 Encounter for antineoplastic chemotherapy: Secondary | ICD-10-CM | POA: Diagnosis not present

## 2022-04-18 DIAGNOSIS — N183 Chronic kidney disease, stage 3 unspecified: Secondary | ICD-10-CM | POA: Diagnosis not present

## 2022-04-18 LAB — CBC WITH DIFFERENTIAL (CANCER CENTER ONLY)
Abs Immature Granulocytes: 0.02 10*3/uL (ref 0.00–0.07)
Basophils Absolute: 0 10*3/uL (ref 0.0–0.1)
Basophils Relative: 0 %
Eosinophils Absolute: 0 10*3/uL (ref 0.0–0.5)
Eosinophils Relative: 0 %
HCT: 30.3 % — ABNORMAL LOW (ref 36.0–46.0)
Hemoglobin: 10.7 g/dL — ABNORMAL LOW (ref 12.0–15.0)
Immature Granulocytes: 0 %
Lymphocytes Relative: 12 %
Lymphs Abs: 0.7 10*3/uL (ref 0.7–4.0)
MCH: 30.9 pg (ref 26.0–34.0)
MCHC: 35.3 g/dL (ref 30.0–36.0)
MCV: 87.6 fL (ref 80.0–100.0)
Monocytes Absolute: 0.2 10*3/uL (ref 0.1–1.0)
Monocytes Relative: 3 %
Neutro Abs: 4.5 10*3/uL (ref 1.7–7.7)
Neutrophils Relative %: 85 %
Platelet Count: 180 10*3/uL (ref 150–400)
RBC: 3.46 MIL/uL — ABNORMAL LOW (ref 3.87–5.11)
RDW: 15.5 % (ref 11.5–15.5)
WBC Count: 5.4 10*3/uL (ref 4.0–10.5)
nRBC: 0 % (ref 0.0–0.2)

## 2022-04-18 LAB — CMP (CANCER CENTER ONLY)
ALT: 14 U/L (ref 0–44)
AST: 20 U/L (ref 15–41)
Albumin: 4 g/dL (ref 3.5–5.0)
Alkaline Phosphatase: 91 U/L (ref 38–126)
Anion gap: 9 (ref 5–15)
BUN: 31 mg/dL — ABNORMAL HIGH (ref 8–23)
CO2: 24 mmol/L (ref 22–32)
Calcium: 9.6 mg/dL (ref 8.9–10.3)
Chloride: 103 mmol/L (ref 98–111)
Creatinine: 1.15 mg/dL — ABNORMAL HIGH (ref 0.44–1.00)
GFR, Estimated: 50 mL/min — ABNORMAL LOW (ref >60)
Glucose, Bld: 126 mg/dL — ABNORMAL HIGH (ref 70–99)
Potassium: 4.1 mmol/L (ref 3.5–5.1)
Sodium: 136 mmol/L (ref 135–145)
Total Bilirubin: 0.3 mg/dL (ref 0.3–1.2)
Total Protein: 7.3 g/dL (ref 6.5–8.1)

## 2022-04-18 MED ORDER — SODIUM CHLORIDE 0.9 % IV SOLN
347.0000 mg | Freq: Once | INTRAVENOUS | Status: AC
  Administered 2022-04-18: 350 mg via INTRAVENOUS
  Filled 2022-04-18: qty 35

## 2022-04-18 MED ORDER — PALONOSETRON HCL INJECTION 0.25 MG/5ML
0.2500 mg | Freq: Once | INTRAVENOUS | Status: AC
  Administered 2022-04-18: 0.25 mg via INTRAVENOUS
  Filled 2022-04-18: qty 5

## 2022-04-18 MED ORDER — SODIUM CHLORIDE 0.9 % IV SOLN
10.0000 mg | Freq: Once | INTRAVENOUS | Status: AC
  Administered 2022-04-18: 10 mg via INTRAVENOUS
  Filled 2022-04-18: qty 10

## 2022-04-18 MED ORDER — FAMOTIDINE IN NACL 20-0.9 MG/50ML-% IV SOLN
20.0000 mg | Freq: Once | INTRAVENOUS | Status: AC
  Administered 2022-04-18: 20 mg via INTRAVENOUS
  Filled 2022-04-18: qty 50

## 2022-04-18 MED ORDER — TRASTUZUMAB-ANNS CHEMO 420 MG IV SOLR
6.0000 mg/kg | Freq: Once | INTRAVENOUS | Status: AC
  Administered 2022-04-18: 420 mg via INTRAVENOUS
  Filled 2022-04-18: qty 20

## 2022-04-18 MED ORDER — SODIUM CHLORIDE 0.9 % IV SOLN
175.0000 mg/m2 | Freq: Once | INTRAVENOUS | Status: AC
  Administered 2022-04-18: 294 mg via INTRAVENOUS
  Filled 2022-04-18: qty 49

## 2022-04-18 MED ORDER — CETIRIZINE HCL 10 MG/ML IV SOLN
10.0000 mg | Freq: Once | INTRAVENOUS | Status: AC
  Administered 2022-04-18: 10 mg via INTRAVENOUS
  Filled 2022-04-18: qty 1

## 2022-04-18 MED ORDER — SODIUM CHLORIDE 0.9 % IV SOLN
150.0000 mg | Freq: Once | INTRAVENOUS | Status: AC
  Administered 2022-04-18: 150 mg via INTRAVENOUS
  Filled 2022-04-18: qty 150

## 2022-04-18 MED ORDER — SODIUM CHLORIDE 0.9 % IV SOLN: Freq: Once | INTRAVENOUS | Status: AC

## 2022-04-18 MED ORDER — SODIUM CHLORIDE 0.9% FLUSH
10.0000 mL | INTRAVENOUS | Status: DC | PRN
  Administered 2022-04-18: 10 mL

## 2022-04-18 MED ORDER — HEPARIN SOD (PORK) LOCK FLUSH 100 UNIT/ML IV SOLN
500.0000 [IU] | Freq: Once | INTRAVENOUS | Status: AC | PRN
  Administered 2022-04-18: 500 [IU]

## 2022-04-18 NOTE — Assessment & Plan Note (Signed)
She has chronic arthralgia, exacerbated by recent chemotherapy She can continue tramadol as needed 

## 2022-04-18 NOTE — Progress Notes (Signed)
[facility] OFFICE PROGRESS NOTE  Patient Care Team: Kayla Fila, MD as PCP - General (Gynecologic Oncology) Kayla Beat, MD as Consulting Physician [facility] Medicine)  ASSESSMENT & PLAN:  Carcinosarcoma of body of uterus [facility]) So far, she tolerated treatment well Recent echo is normal We will proceed with cycle 4 without Mcgrath  Anemia in chronic kidney disease Her blood count is stable She is not symptomatic Observe only  Arthralgia She has chronic arthralgia, exacerbated by recent chemotherapy She can continue tramadol as needed  No orders of the defined types were placed in this encounter.   All questions were answered. The patient knows to call the clinic with any problems, questions or concerns. The total time spent in the appointment was 20 minutes encounter with patients including review of chart and various tests results, discussions about plan of care and coordination of care plan   Kayla Delay, MD 04/18/2022 11:54 AM  INTERVAL HISTORY: Please see below for problem oriented charting. she returns for treatment follow-up seen prior to cycle 4 of treatment She tolerated last cycle well except for arthralgias No recent concern for infection No peripheral neuropathy  REVIEW OF SYSTEMS:   Constitutional: Denies fevers, chills or abnormal weight loss Eyes: Denies blurriness of vision Ears, nose, mouth, throat, and face: Denies mucositis or sore throat Respiratory: Denies cough, dyspnea or wheezes Cardiovascular: Denies palpitation, chest discomfort or lower extremity swelling Gastrointestinal:  Denies nausea, heartburn or change in bowel habits Skin: Denies abnormal skin rashes Lymphatics: Denies new lymphadenopathy or easy bruising Neurological:Denies numbness, tingling or new weaknesses Behavioral/Psych: Mood is stable, no new changes  All other systems were reviewed with the patient and are negative.  I have reviewed the past medical  history, past surgical history, social history and family history with the patient and they are unchanged from previous note.  ALLERGIES:  is allergic to tylenol [acetaminophen] and latex.  MEDICATIONS:  Current Outpatient Medications  Medication Sig Dispense Refill   Cyanocobalamin (VITAMIN B 12 PO) Take 1 tablet by mouth.     dexamethasone (DECADRON) 4 MG tablet Take 2 tabs at the night before and 2 tab the morning of chemotherapy, every 3 weeks, by mouth x 6 cycles 24 tablet 6   escitalopram (LEXAPRO) 10 MG tablet TAKE 1 TABLET EVERY DAY 90 tablet 3   lidocaine-prilocaine (EMLA) cream Apply to affected area once 30 g 3   Magnesium 200 MG TABS Take 2 tablets by mouth daily.     ondansetron (ZOFRAN) 8 MG tablet Take 1 tablet (8 mg total) by mouth every 8 (eight) hours as needed for nausea or vomiting. Start on the third day after chemotherapy. 30 tablet 1   pantoprazole (PROTONIX) 40 MG tablet TAKE 1 TABLET BY MOUTH TWICE A DAY 180 tablet 3   prochlorperazine (COMPAZINE) 10 MG tablet Take 1 tablet (10 mg total) by mouth every 6 (six) hours as needed for nausea or vomiting. 30 tablet 1   traMADol (ULTRAM) 50 MG tablet Take 1 tablet (50 mg total) by mouth every 6 (six) hours as needed. 30 tablet 0   VITAMIN D, CHOLECALCIFEROL, PO Take 1 tablet by mouth daily.     No current facility-administered medications for this visit.   Facility-Administered Medications Ordered in Other Visits  Medication Dose Route Frequency Provider Last Rate Last Admin   CARBOplatin (PARAPLATIN) 350 mg in sodium chloride 0.9 % 100 mL chemo infusion  350 mg Intravenous Once Kayla Delay, MD  cetirizine (QUZYTTIR) injection 10 mg  10 mg Intravenous Once Kayla Mcgrath, Kayla Lightner, MD       famotidine (PEPCID) IVPB 20 mg premix  20 mg Intravenous Once Kayla Mcgrath, Kayla Grattan, MD       fosaprepitant (EMEND) 150 mg in sodium chloride 0.9 % 145 mL IVPB  150 mg Intravenous Once Kayla Delay, MD 450 mL/hr at 04/18/22 1135 150 mg at 04/18/22 1135    PACLitaxel (TAXOL) 294 mg in sodium chloride 0.9 % 250 mL chemo infusion (> 80mg /m2)  175 mg/m2 (Treatment Plan Recorded) Intravenous Once Kayla Mcgrath, Kayla Usery, MD       palonosetron (ALOXI) injection 0.25 mg  0.25 mg Intravenous Once Kayla Mcgrath, Kayla Ruesch, MD       trastuzumab-anns (KANJINTI) 420 mg in sodium chloride 0.9 % 250 mL chemo infusion  6 mg/kg (Treatment Plan Recorded) Intravenous Once Kayla Delay, MD        SUMMARY OF ONCOLOGIC HISTORY: Oncology History Overview Note  MMR normal MSI stable PD-L1 CPS: 1% Her2 positive P53 + carcinosarcoma   Carcinosarcoma of body of uterus  09/23/2021 Initial Diagnosis   She presented with PMB   09/28/2021 Imaging   US pelvis Enlarged uterus with fibroids. Pelvic sonogram is otherwise unremarkable.    11/11/2021 Pathology Results   A. CERVIX, MASS, EXCISION:  - HIGH-GRADE UNDIFFERENTIATED MALIGNANCY WITH EXTENSIVE NECROSIS (SEE COMMENT).   COMMENT:  The tumor consists of mostly high-grade sarcomatoid morphology with focal epithelioid morphology.  Immunohistochemical stains are performed on block A4.  The tumor cells are positive for desmin and p16 while negative for CD45, CK7, CK20, chromogranin, synaptophysin, Melan-A, p40, AE1/AE3, SMA, p63, cyclin D1, S100 and smooth muscle myosin.  Ki-67 proliferation index is high.  CD10 staining shows focal nonspecific staining.  The differential diagnosis includes a high-grade sarcoma and a partially sampled carcinosarcoma.    11/25/2021 Imaging   MRI pelvis Marked distention of the entire endometrial cavity by heterogeneously enhancing soft tissue density, which extends through the endocervical canal into the lower vagina. This is highly suspicious for endometrial carcinoma.   Diffuse myometrial thinning due to marked distention of endometrial cavity limits evaluation; deep myometrial invasion cannot be excluded in the uterine fundus.   No evidence of extra-uterine extension of tumor.   Lymphadenopathy in lower  abdominal retroperitoneum, bilateral iliac chains, and sigmoid mesocolon, consistent with metastatic disease.   Sigmoid diverticulosis. No radiographic evidence of diverticulitis.   11/26/2021 Initial Diagnosis   Uterine cancer (HCC)   11/26/2021 Cancer Staging   Staging form: Corpus Uteri - Carcinoma and Carcinosarcoma, AJCC 8th Edition - Clinical stage from 11/26/2021: FIGO Stage IIIC2 (cT3, cN2, cM0) - Signed by Kayla Delay, MD on 11/26/2021 Stage prefix: Initial diagnosis Histologic grade (G): G3 Histologic grading system: 3 grade system   11/30/2021 Pathology Results   FINAL MICROSCOPIC DIAGNOSIS:  A. UTERINE CONTENTS, BIOPSY: - HIGH-GRADE UNDIFFERENTIATED MALIGNANCY WITH EXTENSIVE NECROSIS (SEE COMMENT).  COMMENT: The patient's history of a high-grade undifferentiated malignancy with extensive necrosis of the cervix is noted.  The morphological features of the current tumor cells are similar to the tumor cells seen in the previous cervical mass excision.  Immunohistochemical staining for desmin and cytokeratin AE1/AE3 is performed on block A1.  The tumor cells are diffusely positive for desmin.   AE1/AE3 stain is focally positive, however this is scant and of unclear clinical significance. Given the small quantity of tissue in the current biopsy, a definitive distinction between a high-grade sarcoma and carcinosarcoma cannot be made as this biopsy may not  be representative of the entire tumor. If clinically indicated, this case as well as the previous cervical mass excision ([id number]) may be sent out for expert consultation. Clinical correlation recommended.     12/01/2021 Tumor Marker   Patient's tumor was tested for the following markers: CA-125. Results of the tumor marker test revealed 491.   12/22/2021 Surgery   Robotic-assisted laparoscopic total hysterectomy with bilateral salpingo-oophorectomy, tumor debulking including high common iliac lymph node excision, aborted  attempt of bilateral external iliac enlarged lymph nodes, mini-lap for specimen delivery, cystoscopy    12/22/2021 Pathology Results   FINAL MICROSCOPIC DIAGNOSIS:  A. LEFT CERVICAL MARGIN, EXCISION: - Positive for carcinoma  B. HIGH LEFT COMMON ILIAC LYMPH NODE, EXCISION: - Metastatic carcinosarcoma involving the lymph node  C. 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, including undifferentiated sarcoma with rhabdoid features and high-grade serous carcinoma, see comment - Tumor invades for a depth of 1 mm where myometrial thickness is 5 mm - Benign bilateral fallopian tubes and ovaries - See oncology table - See comment  ONCOLOGY TABLE:  UTERUS, CARCINOMA OR CARCINOSARCOMA: Resection  Procedure: Total hysterectomy and bilateral salpingo-oophorectomy Histologic Type: Carcinosarcoma (mixed malignant Mullerian tumor) Histologic Grade: High-grade Myometrial Invasion:      Depth of Myometrial Invasion (mm): 1 mm      Myometrial Thickness (mm): 5 mm      Percentage of Myometrial Invasion: 20% Uterine Serosa Involvement: Not identified Cervical stromal Involvement: Present Extent of involvement of other tissue/organs: Not identified Peritoneal/Ascitic Fluid: Not applicable Lymphovascular Invasion: Present Regional Lymph Nodes:      Pelvic Lymph Nodes Examined:                                  0 Sentinel                                  1 Non-sentinel                                  1 Total      Pelvic Lymph Nodes with Metastasis: 1                          Macrometastasis: (>2.0 mm): 1                          Micrometastasis: (>0.2 mm and < 2.0 mm): 0                          Isolated Tumor Cells (<0.2 mm): 0                          Laterality of Lymph Node with Tumor: Left                          Extracapsular Extension: Not identified      Para-aortic Lymph Nodes Examined:                                    0  Sentinel                                   0 non-sentinel                                   0 total Distant Metastasis:      Distant Site(s) Involved: Not applicable Pathologic Stage Classification (pTNM, AJCC 8th Edition): pT1a, pN1 Ancillary Studies: MMR / MSI testing will be ordered Representative Tumor Block: C1 Comment(s): None  ([id number])       01/13/2022 Procedure   Placement of single lumen port a cath via right internal jugular vein. The catheter tip lies at the cavo-atrial junction. A power injectable port a cath was placed and is ready for immediate use.     01/19/2022 Echocardiogram    1. Left ventricular ejection fraction, by estimation, is 60 to 65%. The left ventricle has normal function. The left ventricle has no regional wall motion abnormalities. Left ventricular diastolic parameters are consistent with Grade I diastolic dysfunction (impaired relaxation). The average left ventricular global longitudinal strain is -24.0 %. The global longitudinal strain is normal.  2. Right ventricular systolic function is normal. The right ventricular size is normal. There is normal pulmonary artery systolic pressure.  3. Left atrial size was mildly dilated.  4. The mitral valve is normal in structure. No evidence of mitral valve regurgitation. No evidence of mitral stenosis.  5. The aortic valve was not well visualized. Aortic valve regurgitation is not visualized. Aortic valve sclerosis is present, with no evidence of aortic valve stenosis.  6. The inferior vena cava is normal in size with greater than 50% respiratory variability, suggesting right atrial pressure of 3 mmHg.     01/27/2022 Tumor Marker   Patient's tumor was tested for the following markers: CA-125. Results of the tumor marker test revealed 307.   02/14/2022 -  Chemotherapy   Patient is on Treatment Plan : UTERINE SEROUS CARCINOMA Carboplatin + Paclitaxel + Trastuzumab q21d x 6 Cycles / Trastuzumab  q21d     02/15/2022 Tumor Marker   Patient's tumor was tested for the following markers: CA-125. Results of the tumor marker test revealed 161.   03/29/2022 Tumor Marker   Patient's tumor was tested for the following markers: CA-125. Results of the tumor marker test revealed 30.   04/13/2022 Echocardiogram   1. Left ventricular ejection fraction, by estimation, is 60 to 65%. The left ventricle has normal function. The left ventricle has no regional wall motion abnormalities. Left ventricular diastolic parameters are consistent with Grade I diastolic dysfunction (impaired relaxation). The average left ventricular global longitudinal strain is -19.4 %. The global longitudinal strain is normal.  2. Right ventricular systolic function is normal. The right ventricular size is normal. There is normal pulmonary artery systolic pressure. The estimated right ventricular systolic pressure is 21.0 mmHg.  3. Left atrial size was mild to moderately dilated.  4. The mitral valve is grossly normal. Trivial mitral valve regurgitation.  5. The aortic valve is tricuspid. Aortic valve regurgitation is not visualized. Aortic valve sclerosis is present, with no evidence of aortic valve stenosis.  6. The inferior vena cava is normal in size with greater than 50% respiratory variability, suggesting right atrial pressure of 3 mmHg.     PHYSICAL EXAMINATION: ECOG PERFORMANCE STATUS: 1 - Symptomatic but completely ambulatory  Vitals:   04/18/22 1107  BP: (!) 160/81  Pulse: 90  Resp: 18  Temp: 97.9 F (36.6 C)  SpO2: 99%   Filed Weights   04/18/22 1107  Weight: 144 lb 12.8 oz (65.7 kg)    GENERAL:alert, no distress and comfortable  NEURO: alert & oriented x 3 with fluent speech, no focal motor/sensory deficits  LABORATORY DATA:  I have reviewed the data as listed    Component Value Date/Time   NA 136 04/18/2022 1047   NA 140 11/02/2016 1130   K 4.1 04/18/2022 1047   K 3.8 11/02/2016 1130   CL 103  04/18/2022 1047   CL 105 05/02/2012 0822   CO2 24 04/18/2022 1047   CO2 26 11/02/2016 1130   GLUCOSE 126 (H) 04/18/2022 1047   GLUCOSE 87 11/02/2016 1130   GLUCOSE 98 05/02/2012 0822   BUN 31 (H) 04/18/2022 1047   BUN 14.7 11/02/2016 1130   CREATININE 1.15 (H) 04/18/2022 1047   CREATININE 0.84 12/05/2018 1014   CREATININE 0.8 11/02/2016 1130   CALCIUM 9.6 04/18/2022 1047   CALCIUM 9.7 11/02/2016 1130   PROT 7.3 04/18/2022 1047   PROT 6.8 11/02/2016 1130   ALBUMIN 4.0 04/18/2022 1047   ALBUMIN 3.6 11/02/2016 1130   AST 20 04/18/2022 1047   AST 23 11/02/2016 1130   ALT 14 04/18/2022 1047   ALT 17 11/02/2016 1130   ALKPHOS 91 04/18/2022 1047   ALKPHOS 59 11/02/2016 1130   BILITOT 0.3 04/18/2022 1047   BILITOT 0.34 11/02/2016 1130   GFRNONAA 50 (L) 04/18/2022 1047   GFRAA >60 11/08/2017 0504   GFRAA >60 07/25/2017 1425    No results found for: "SPEP", "UPEP"  Lab Results  Component Value Date   WBC 5.4 04/18/2022   NEUTROABS 4.5 04/18/2022   HGB 10.7 (L) 04/18/2022   HCT 30.3 (L) 04/18/2022   MCV 87.6 04/18/2022   PLT 180 04/18/2022      Chemistry      Component Value Date/Time   NA 136 04/18/2022 1047   NA 140 11/02/2016 1130   K 4.1 04/18/2022 1047   K 3.8 11/02/2016 1130   CL 103 04/18/2022 1047   CL 105 05/02/2012 0822   CO2 24 04/18/2022 1047   CO2 26 11/02/2016 1130   BUN 31 (H) 04/18/2022 1047   BUN 14.7 11/02/2016 1130   CREATININE 1.15 (H) 04/18/2022 1047   CREATININE 0.84 12/05/2018 1014   CREATININE 0.8 11/02/2016 1130      Component Value Date/Time   CALCIUM 9.6 04/18/2022 1047   CALCIUM 9.7 11/02/2016 1130   ALKPHOS 91 04/18/2022 1047   ALKPHOS 59 11/02/2016 1130   AST 20 04/18/2022 1047   AST 23 11/02/2016 1130   ALT 14 04/18/2022 1047   ALT 17 11/02/2016 1130   BILITOT 0.3 04/18/2022 1047   BILITOT 0.34 11/02/2016 1130       RADIOGRAPHIC STUDIES: I have personally reviewed the radiological images as listed and agreed with the  findings in the report. ECHOCARDIOGRAM COMPLETE  Result Date: 04/13/2022    ECHOCARDIOGRAM REPORT   Patient Name:   Kayla Mcgrath [facility] Date of Exam: 04/13/2022 Medical Rec #:  [id number]      Height:       59.5 in Accession #:    [id number]     Weight:       144.6 lb Date of Birth:  1945-08-08      BSA:  1.616 m Patient Age:    76 years       BP:           135/67 mmHg Patient Gender: F              HR:           78 bpm. Exam Location:  Outpatient Procedure: 2D Echo Indications:    chemo  History:        Patient has prior history of Echocardiogram examinations. Risk                 Factors:Dyslipidemia.  Sonographer:    Lucy AntiguaAmber Murphy Referring Phys: [id number] Kaley Jutras IMPRESSIONS  1. Left ventricular ejection fraction, by estimation, is 60 to 65%. The left ventricle has normal function. The left ventricle has no regional wall motion abnormalities. Left ventricular diastolic parameters are consistent with Grade I diastolic dysfunction (impaired relaxation). The average left ventricular global longitudinal strain is -19.4 %. The global longitudinal strain is normal.  2. Right ventricular systolic function is normal. The right ventricular size is normal. There is normal pulmonary artery systolic pressure. The estimated right ventricular systolic pressure is 21.0 mmHg.  3. Left atrial size was mild to moderately dilated.  4. The mitral valve is grossly normal. Trivial mitral valve regurgitation.  5. The aortic valve is tricuspid. Aortic valve regurgitation is not visualized. Aortic valve sclerosis is present, with no evidence of aortic valve stenosis.  6. The inferior vena cava is normal in size with greater than 50% respiratory variability, suggesting right atrial pressure of 3 mmHg. Comparison(s): No significant change from prior study. FINDINGS  Left Ventricle: Left ventricular ejection fraction, by estimation, is 60 to 65%. The left ventricle has normal function. The left ventricle has no regional wall motion  abnormalities. The average left ventricular global longitudinal strain is -19.4 %. The global longitudinal strain is normal. The global longitudinal strain is normal despite suboptimal segment tracking. The left ventricular internal cavity size was normal in size. There is no left ventricular hypertrophy. Left ventricular diastolic parameters are consistent with Grade I diastolic dysfunction (impaired relaxation). Right Ventricle: The right ventricular size is normal. No increase in right ventricular wall thickness. Right ventricular systolic function is normal. There is normal pulmonary artery systolic pressure. The tricuspid regurgitant velocity is 2.12 m/s, and  with an assumed right atrial pressure of 3 mmHg, the estimated right ventricular systolic pressure is 21.0 mmHg. Left Atrium: Left atrial size was mild to moderately dilated. Right Atrium: Right atrial size was normal in size. Pericardium: There is no evidence of pericardial effusion. Mitral Valve: The mitral valve is grossly normal. There is mild thickening of the mitral valve leaflet(s). Trivial mitral valve regurgitation. Tricuspid Valve: The tricuspid valve is normal in structure. Tricuspid valve regurgitation is trivial. Aortic Valve: The aortic valve is tricuspid. Aortic valve regurgitation is not visualized. Aortic valve sclerosis is present, with no evidence of aortic valve stenosis. Aortic valve mean gradient measures 5.0 mmHg. Aortic valve peak gradient measures 10.0 mmHg. Aortic valve area, by VTI measures 2.24 cm. Pulmonic Valve: The pulmonic valve was normal in structure. Pulmonic valve regurgitation is trivial. Aorta: The aortic root is normal in size and structure. Venous: The inferior vena cava is normal in size with greater than 50% respiratory variability, suggesting right atrial pressure of 3 mmHg. IAS/Shunts: The atrial septum is grossly normal.  LEFT VENTRICLE PLAX 2D LVIDd:         3.60 cm  Diastology LVIDs:         2.40 cm      LV e' medial:    6.96 cm/s LV PW:         0.90 cm     LV E/e' medial:  9.7 LV IVS:        0.80 cm     LV e' lateral:   8.38 cm/s LVOT diam:     1.80 cm     LV E/e' lateral: 8.1 LV SV:         68 LV SV Index:   42          2D Longitudinal Strain LVOT Area:     2.54 cm    2D Strain GLS Avg:     -19.4 %  LV Volumes (MOD) LV vol d, MOD A4C: 74.6 ml LV vol s, MOD A4C: 25.5 ml LV SV MOD A4C:     74.6 ml RIGHT VENTRICLE             IVC RV S prime:     18.20 cm/s  IVC diam: 1.70 cm TAPSE (M-mode): 2.4 cm LEFT ATRIUM             Index        RIGHT ATRIUM           Index LA Vol (A2C):   65.9 ml 40.78 ml/m  RA Area:     12.00 cm LA Vol (A4C):   59.1 ml 36.57 ml/m  RA Volume:   24.80 ml  15.34 ml/m LA Biplane Vol: 62.8 ml 38.86 ml/m  AORTIC VALVE AV Area (Vmax):    0.90 cm AV Area (Vmean):   1.88 cm AV Area (VTI):     2.24 cm AV Vmax:           158.00 cm/s AV Vmean:          106.000 cm/s AV VTI:            0.306 m AV Peak Grad:      10.0 mmHg AV Mean Grad:      5.0 mmHg LVOT Vmax:         55.71 cm/s LVOT Vmean:        78.400 cm/s LVOT VTI:          0.269 m LVOT/AV VTI ratio: 0.88  AORTA Ao Root diam: 3.00 cm Ao Asc diam:  3.00 cm MITRAL VALVE               TRICUSPID VALVE MV Area (PHT): 6.60 cm    TV Peak grad:   20.1 mmHg MV Decel Time: 115 msec    TV Vmax:        2.24 m/s MV E velocity: 67.70 cm/s  TR Peak grad:   18.0 mmHg MV A velocity: 87.40 cm/s  TR Vmax:        212.00 cm/s MV E/A ratio:  0.77                            SHUNTS                            Systemic VTI:  0.27 m                            Systemic Diam: 1.80 cm Laurance Flatten MD Electronically signed by  Laurance Flatten MD Signature Date/Time: 04/13/2022/11:46:32 AM    Final

## 2022-04-18 NOTE — Assessment & Plan Note (Signed)
Her blood count is stable She is not symptomatic Observe only

## 2022-04-18 NOTE — Patient Instructions (Signed)
[name]  Discharge Instructions: Thank you for choosing [facility] to provide your oncology and hematology care.   If you have a lab appointment with the Cancer Center, please go directly to the Cancer Center and check in at the registration area.   Wear comfortable clothing and clothing appropriate for easy access to any Portacath or PICC line.   We strive to give you quality time with your provider. You may need to reschedule your appointment if you arrive late (15 or more minutes).  Arriving late affects you and other patients whose appointments are after yours.  Also, if you miss three or more appointments without notifying the office, you may be dismissed from the clinic at the provider's discretion.      For prescription refill requests, have your pharmacy contact our office and allow 72 hours for refills to be completed.    Today you received the following chemotherapy and/or immunotherapy agents: Trastuzumab, Paclitaxel, Carboplatin      To help prevent nausea and vomiting after your treatment, we encourage you to take your nausea medication as directed.  BELOW ARE SYMPTOMS THAT SHOULD BE REPORTED IMMEDIATELY: *FEVER GREATER THAN 100.4 F (38 C) OR HIGHER *CHILLS OR SWEATING *NAUSEA AND VOMITING THAT IS NOT CONTROLLED WITH YOUR NAUSEA MEDICATION *UNUSUAL SHORTNESS OF BREATH *UNUSUAL BRUISING OR BLEEDING *URINARY PROBLEMS (pain or burning when urinating, or frequent urination) *BOWEL PROBLEMS (unusual diarrhea, constipation, pain near the anus) TENDERNESS IN MOUTH AND THROAT WITH OR WITHOUT PRESENCE OF ULCERS (sore throat, sores in mouth, or a toothache) UNUSUAL RASH, SWELLING OR PAIN  UNUSUAL VAGINAL DISCHARGE OR ITCHING   Items with * indicate a potential emergency and should be followed up as soon as possible or go to the Emergency Department if any problems should occur.  Please show the CHEMOTHERAPY ALERT CARD or  IMMUNOTHERAPY ALERT CARD at check-in to the Emergency Department and triage nurse.  Should you have questions after your visit or need to cancel or reschedule your appointment, please contact [facility]  Dept: [phone]  and follow the prompts.  Office hours are 8:00 a.m. to 4:30 p.m. Monday - Friday. Please note that voicemails left after 4:00 p.m. may not be returned until the following business day.  We are closed weekends and major holidays. You have access to a nurse at all times for urgent questions. Please call the main number to the clinic Dept: [phone] and follow the prompts.   For any non-urgent questions, you may also contact your provider using MyChart. We now offer e-Visits for anyone 18 and older to request care online for non-urgent symptoms. For details visit [email or web address].   Also download the MyChart app! Go to the app store, search "MyChart", open the app, select [place], and log in with your MyChart username and password.   

## 2022-04-18 NOTE — Assessment & Plan Note (Signed)
So far, she tolerated treatment well Recent echo is normal We will proceed with cycle 4 without delay

## 2022-05-06 MED FILL — Fosaprepitant Dimeglumine For IV Infusion 150 MG (Base Eq): INTRAVENOUS | Qty: 5 | Status: AC

## 2022-05-06 MED FILL — Dexamethasone Sodium Phosphate Inj 100 MG/10ML: INTRAMUSCULAR | Qty: 1 | Status: AC

## 2022-05-09 ENCOUNTER — Inpatient Hospital Stay (HOSPITAL_BASED_OUTPATIENT_CLINIC_OR_DEPARTMENT_OTHER): Payer: Medicare HMO | Admitting: Hematology and Oncology

## 2022-05-09 ENCOUNTER — Inpatient Hospital Stay: Payer: Medicare HMO

## 2022-05-09 ENCOUNTER — Encounter: Payer: Self-pay | Admitting: Hematology and Oncology

## 2022-05-09 VITALS — BP 147/79 | HR 72 | Temp 97.9°F | Resp 20

## 2022-05-09 VITALS — BP 173/75 | HR 80 | Temp 98.1°F | Resp 18 | Ht 59.5 in | Wt 148.8 lb

## 2022-05-09 DIAGNOSIS — D631 Anemia in chronic kidney disease: Secondary | ICD-10-CM | POA: Diagnosis not present

## 2022-05-09 DIAGNOSIS — R030 Elevated blood-pressure reading, without diagnosis of hypertension: Secondary | ICD-10-CM | POA: Diagnosis not present

## 2022-05-09 DIAGNOSIS — C549 Malignant neoplasm of corpus uteri, unspecified: Secondary | ICD-10-CM

## 2022-05-09 DIAGNOSIS — D61818 Other pancytopenia: Secondary | ICD-10-CM | POA: Diagnosis not present

## 2022-05-09 DIAGNOSIS — Z5111 Encounter for antineoplastic chemotherapy: Secondary | ICD-10-CM | POA: Diagnosis not present

## 2022-05-09 DIAGNOSIS — Z5112 Encounter for antineoplastic immunotherapy: Secondary | ICD-10-CM | POA: Diagnosis not present

## 2022-05-09 DIAGNOSIS — N189 Chronic kidney disease, unspecified: Secondary | ICD-10-CM | POA: Diagnosis not present

## 2022-05-09 LAB — CBC WITH DIFFERENTIAL (CANCER CENTER ONLY)
Abs Immature Granulocytes: 0.05 10*3/uL (ref 0.00–0.07)
Basophils Absolute: 0 10*3/uL (ref 0.0–0.1)
Basophils Relative: 0 %
Eosinophils Absolute: 0 10*3/uL (ref 0.0–0.5)
Eosinophils Relative: 0 %
HCT: 28.7 % — ABNORMAL LOW (ref 36.0–46.0)
Hemoglobin: 9.6 g/dL — ABNORMAL LOW (ref 12.0–15.0)
Immature Granulocytes: 1 %
Lymphocytes Relative: 15 %
Lymphs Abs: 0.7 10*3/uL (ref 0.7–4.0)
MCH: 31.1 pg (ref 26.0–34.0)
MCHC: 33.4 g/dL (ref 30.0–36.0)
MCV: 92.9 fL (ref 80.0–100.0)
Monocytes Absolute: 0.2 10*3/uL (ref 0.1–1.0)
Monocytes Relative: 3 %
Neutro Abs: 3.7 10*3/uL (ref 1.7–7.7)
Neutrophils Relative %: 81 %
Platelet Count: 141 10*3/uL — ABNORMAL LOW (ref 150–400)
RBC: 3.09 MIL/uL — ABNORMAL LOW (ref 3.87–5.11)
RDW: 17.9 % — ABNORMAL HIGH (ref 11.5–15.5)
WBC Count: 4.5 10*3/uL (ref 4.0–10.5)
nRBC: 0 % (ref 0.0–0.2)

## 2022-05-09 LAB — CMP (CANCER CENTER ONLY)
ALT: 17 U/L (ref 0–44)
AST: 24 U/L (ref 15–41)
Albumin: 3.9 g/dL (ref 3.5–5.0)
Alkaline Phosphatase: 106 U/L (ref 38–126)
Anion gap: 9 (ref 5–15)
BUN: 24 mg/dL — ABNORMAL HIGH (ref 8–23)
CO2: 24 mmol/L (ref 22–32)
Calcium: 9.4 mg/dL (ref 8.9–10.3)
Chloride: 107 mmol/L (ref 98–111)
Creatinine: 1.04 mg/dL — ABNORMAL HIGH (ref 0.44–1.00)
GFR, Estimated: 56 mL/min — ABNORMAL LOW (ref >60)
Glucose, Bld: 124 mg/dL — ABNORMAL HIGH (ref 70–99)
Potassium: 3.8 mmol/L (ref 3.5–5.1)
Sodium: 140 mmol/L (ref 135–145)
Total Bilirubin: 0.3 mg/dL (ref 0.3–1.2)
Total Protein: 7 g/dL (ref 6.5–8.1)

## 2022-05-09 MED ORDER — FAMOTIDINE IN NACL 20-0.9 MG/50ML-% IV SOLN
20.0000 mg | Freq: Once | INTRAVENOUS | Status: AC
  Administered 2022-05-09: 20 mg via INTRAVENOUS
  Filled 2022-05-09: qty 50

## 2022-05-09 MED ORDER — SODIUM CHLORIDE 0.9% FLUSH
10.0000 mL | Freq: Once | INTRAVENOUS | Status: AC
  Administered 2022-05-09: 10 mL

## 2022-05-09 MED ORDER — SODIUM CHLORIDE 0.9 % IV SOLN
175.0000 mg/m2 | Freq: Once | INTRAVENOUS | Status: AC
  Administered 2022-05-09: 294 mg via INTRAVENOUS
  Filled 2022-05-09: qty 49

## 2022-05-09 MED ORDER — SODIUM CHLORIDE 0.9 % IV SOLN: Freq: Once | INTRAVENOUS | Status: AC

## 2022-05-09 MED ORDER — CETIRIZINE HCL 10 MG/ML IV SOLN
10.0000 mg | Freq: Once | INTRAVENOUS | Status: AC
  Administered 2022-05-09: 10 mg via INTRAVENOUS
  Filled 2022-05-09: qty 1

## 2022-05-09 MED ORDER — SODIUM CHLORIDE 0.9 % IV SOLN
10.0000 mg | Freq: Once | INTRAVENOUS | Status: AC
  Administered 2022-05-09: 10 mg via INTRAVENOUS
  Filled 2022-05-09: qty 10

## 2022-05-09 MED ORDER — PALONOSETRON HCL INJECTION 0.25 MG/5ML
0.2500 mg | Freq: Once | INTRAVENOUS | Status: AC
  Administered 2022-05-09: 0.25 mg via INTRAVENOUS
  Filled 2022-05-09: qty 5

## 2022-05-09 MED ORDER — SODIUM CHLORIDE 0.9 % IV SOLN
150.0000 mg | Freq: Once | INTRAVENOUS | Status: AC
  Administered 2022-05-09: 150 mg via INTRAVENOUS
  Filled 2022-05-09: qty 150

## 2022-05-09 MED ORDER — SODIUM CHLORIDE 0.9% FLUSH
10.0000 mL | INTRAVENOUS | Status: DC | PRN
  Administered 2022-05-09: 10 mL

## 2022-05-09 MED ORDER — SODIUM CHLORIDE 0.9 % IV SOLN
370.5000 mg | Freq: Once | INTRAVENOUS | Status: AC
  Administered 2022-05-09: 370 mg via INTRAVENOUS
  Filled 2022-05-09: qty 37

## 2022-05-09 MED ORDER — TRASTUZUMAB-ANNS CHEMO 150 MG IV SOLR
6.0000 mg/kg | Freq: Once | INTRAVENOUS | Status: AC
  Administered 2022-05-09: 420 mg via INTRAVENOUS
  Filled 2022-05-09: qty 20

## 2022-05-09 MED ORDER — HEPARIN SOD (PORK) LOCK FLUSH 100 UNIT/ML IV SOLN
500.0000 [IU] | Freq: Once | INTRAVENOUS | Status: AC | PRN
  Administered 2022-05-09: 500 [IU]

## 2022-05-09 NOTE — Progress Notes (Signed)
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[facility]) She will be completing treatment soon I will reach out to genetic counselor to see if she qualifies for genetic testing The plan will be to repeat imaging study after cycle 6 She would benefit from long-term treatment with trastuzumab  Pancytopenia, acquired [facility]) She has slight worsening pancytopenia due to treatment She is not symptomatic We will proceed with treatment without delay  Elevated BP without diagnosis of hypertension Blood pressure is generally high here but normal at home We will monitor carefully  No orders of the defined types were placed in this encounter.   All questions were answered. The patient knows to call the clinic with any problems, questions or concerns. The total time spent in the appointment was 20 minutes encounter with patients including review of chart and various tests results, discussions about plan of care and coordination of care plan   Artis Delay, MD 05/09/2022 10:49 AM  INTERVAL HISTORY: Please see below for problem oriented charting. she returns for treatment follow-up She tolerated recent treatment well except for some mild bony aches No peripheral neuropathy Denies nausea or constipation She is not symptomatic from anemia  REVIEW OF SYSTEMS:   Constitutional: Denies fevers, chills or abnormal weight loss Eyes: Denies blurriness of vision Ears, nose, mouth, throat, and face: Denies mucositis or sore throat Respiratory: Denies cough, dyspnea or wheezes Cardiovascular: Denies palpitation, chest discomfort or lower extremity swelling Gastrointestinal:  Denies nausea, heartburn or change in bowel habits Skin: Denies abnormal skin rashes Lymphatics: Denies new lymphadenopathy or easy  bruising Neurological:Denies numbness, tingling or new weaknesses Behavioral/Psych: Mood is stable, no new changes  All other systems were reviewed with the patient and are negative.  I have reviewed the past medical history, past surgical history, social history and family history with the patient and they are unchanged from previous note.  ALLERGIES:  is allergic to tylenol [acetaminophen] and latex.  MEDICATIONS:  Current Outpatient Medications  Medication Sig Dispense Refill   Cyanocobalamin (VITAMIN B 12 PO) Take 1 tablet by mouth.     dexamethasone (DECADRON) 4 MG tablet Take 2 tabs at the night before and 2 tab the morning of chemotherapy, every 3 weeks, by mouth x 6 cycles 24 tablet 6   escitalopram (LEXAPRO) 10 MG tablet TAKE 1 TABLET EVERY DAY 90 tablet 3   lidocaine-prilocaine (EMLA) cream Apply to affected area once 30 g 3   Magnesium 200 MG TABS Take 2 tablets by mouth daily.     ondansetron (ZOFRAN) 8 MG tablet Take 1 tablet (8 mg total) by mouth every 8 (eight) hours as needed for nausea or vomiting. Start on the third day after chemotherapy. 30 tablet 1   pantoprazole (PROTONIX) 40 MG tablet TAKE 1 TABLET BY MOUTH TWICE A DAY 180 tablet 3   prochlorperazine (COMPAZINE) 10 MG tablet Take 1 tablet (10 mg total) by mouth every 6 (six) hours as needed for nausea or vomiting. 30 tablet 1   traMADol (ULTRAM) 50 MG tablet Take 1 tablet (50 mg total) by mouth every 6 (six) hours as needed. 30 tablet 0   VITAMIN D, CHOLECALCIFEROL, PO Take 1 tablet by mouth daily.     No current facility-administered medications for this visit.   Facility-Administered Medications Ordered in Other Visits  Medication Dose  Route Frequency Provider Last Rate Last Admin   CARBOplatin (PARAPLATIN) 370 mg in sodium chloride 0.9 % 100 mL chemo infusion  370 mg Intravenous Once Bertis Ruddy, Adelia Baptista, MD       cetirizine (QUZYTTIR) injection 10 mg  10 mg Intravenous Once Bertis Ruddy, Rober Skeels, MD       dexamethasone (DECADRON)  10 mg in sodium chloride 0.9 % 50 mL IVPB  10 mg Intravenous Once Bertis Ruddy, Prudence Heiny, MD       famotidine (PEPCID) IVPB 20 mg premix  20 mg Intravenous Once Bertis Ruddy, Vershawn Westrup, MD       fosaprepitant (EMEND) 150 mg in sodium chloride 0.9 % 145 mL IVPB  150 mg Intravenous Once Bertis Ruddy, Darlyne Schmiesing, MD       heparin lock flush 100 unit/mL  500 Units Intracatheter Once PRN Bertis Ruddy, Mylasia Vorhees, MD       PACLitaxel (TAXOL) 294 mg in sodium chloride 0.9 % 250 mL chemo infusion (> 80mg /m2)  175 mg/m2 (Treatment Plan Recorded) Intravenous Once Bertis Ruddy, Aleza Pew, MD       palonosetron (ALOXI) injection 0.25 mg  0.25 mg Intravenous Once Bertis Ruddy, Jaeden Messer, MD       sodium chloride flush (NS) 0.9 % injection 10 mL  10 mL Intracatheter PRN Bertis Ruddy, [place] Prout, MD       trastuzumab-anns (KANJINTI) 420 mg in sodium chloride 0.9 % 250 mL chemo infusion  6 mg/kg (Treatment Plan Recorded) Intravenous Once Artis Delay, MD        SUMMARY OF ONCOLOGIC HISTORY: Oncology History Overview Note  MMR normal MSI stable PD-L1 CPS: 1% Her2 positive P53 + carcinosarcoma   Carcinosarcoma of body of uterus (HCC)  09/23/2021 Initial Diagnosis   She presented with PMB   09/28/2021 Imaging   US pelvis Enlarged uterus with fibroids. Pelvic sonogram is otherwise unremarkable.    11/11/2021 Pathology Results   A. CERVIX, MASS, EXCISION:  - HIGH-GRADE UNDIFFERENTIATED MALIGNANCY WITH EXTENSIVE NECROSIS (SEE COMMENT).   COMMENT:  The tumor consists of mostly high-grade sarcomatoid morphology with focal epithelioid morphology.  Immunohistochemical stains are performed on block A4.  The tumor cells are positive for desmin and p16 while negative for CD45, CK7, CK20, chromogranin, synaptophysin, Melan-A, p40, AE1/AE3, SMA, p63, cyclin D1, S100 and smooth muscle myosin.  Ki-67 proliferation index is high.  CD10 staining shows focal nonspecific staining.  The differential diagnosis includes a high-grade sarcoma and a partially sampled carcinosarcoma.    11/25/2021 Imaging   MRI  pelvis Marked distention of the entire endometrial cavity by heterogeneously enhancing soft tissue density, which extends through the endocervical canal into the lower vagina. This is highly suspicious for endometrial carcinoma.   Diffuse myometrial thinning due to marked distention of endometrial cavity limits evaluation; deep myometrial invasion cannot be excluded in the uterine fundus.   No evidence of extra-uterine extension of tumor.   Lymphadenopathy in lower abdominal retroperitoneum, bilateral iliac chains, and sigmoid mesocolon, consistent with metastatic disease.   Sigmoid diverticulosis. No radiographic evidence of diverticulitis.   11/26/2021 Initial Diagnosis   Uterine cancer (HCC)   11/26/2021 Cancer Staging   Staging form: Corpus Uteri - Carcinoma and Carcinosarcoma, AJCC 8th Edition - Clinical stage from 11/26/2021: FIGO Stage IIIC2 (cT3, cN2, cM0) - Signed by Artis Delay, MD on 11/26/2021 Stage prefix: Initial diagnosis Histologic grade (G): G3 Histologic grading system: 3 grade system   11/30/2021 Pathology Results   FINAL MICROSCOPIC DIAGNOSIS:  A. UTERINE CONTENTS, BIOPSY: - HIGH-GRADE UNDIFFERENTIATED MALIGNANCY WITH EXTENSIVE NECROSIS (SEE COMMENT).  COMMENT: The patient's history  of a high-grade undifferentiated malignancy with extensive necrosis of the cervix is noted.  The morphological features of the current tumor cells are similar to the tumor cells seen in the previous cervical mass excision.  Immunohistochemical staining for desmin and cytokeratin AE1/AE3 is performed on block A1.  The tumor cells are diffusely positive for desmin.   AE1/AE3 stain is focally positive, however this is scant and of unclear clinical significance. Given the small quantity of tissue in the current biopsy, a definitive distinction between a high-grade sarcoma and carcinosarcoma cannot be made as this biopsy may not be representative of the entire tumor. If clinically indicated,  this case as well as the previous cervical mass excision ([id number]) may be sent out for expert consultation. Clinical correlation recommended.     12/01/2021 Tumor Marker   Patient's tumor was tested for the following markers: CA-125. Results of the tumor marker test revealed 491.   12/22/2021 Surgery   Robotic-assisted laparoscopic total hysterectomy with bilateral salpingo-oophorectomy, tumor debulking including high common iliac lymph node excision, aborted attempt of bilateral external iliac enlarged lymph nodes, mini-lap for specimen delivery, cystoscopy    12/22/2021 Pathology Results   FINAL MICROSCOPIC DIAGNOSIS:  A. LEFT CERVICAL MARGIN, EXCISION: - Positive for carcinoma  B. HIGH LEFT COMMON ILIAC LYMPH NODE, EXCISION: - Metastatic carcinosarcoma involving the lymph node  C. 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, including undifferentiated sarcoma with rhabdoid features and high-grade serous carcinoma, see comment - Tumor invades for a depth of 1 mm where myometrial thickness is 5 mm - Benign bilateral fallopian tubes and ovaries - See oncology table - See comment  ONCOLOGY TABLE:  UTERUS, CARCINOMA OR CARCINOSARCOMA: Resection  Procedure: Total hysterectomy and bilateral salpingo-oophorectomy Histologic Type: Carcinosarcoma (mixed malignant Mullerian tumor) Histologic Grade: High-grade Myometrial Invasion:      Depth of Myometrial Invasion (mm): 1 mm      Myometrial Thickness (mm): 5 mm      Percentage of Myometrial Invasion: 20% Uterine Serosa Involvement: Not identified Cervical stromal Involvement: Present Extent of involvement of other tissue/organs: Not identified Peritoneal/Ascitic Fluid: Not applicable Lymphovascular Invasion: Present Regional Lymph Nodes:      Pelvic Lymph Nodes Examined:                                  0 Sentinel                                   1 Non-sentinel                                  1 Total      Pelvic Lymph Nodes with Metastasis: 1                          Macrometastasis: (>2.0 mm): 1                          Micrometastasis: (>0.2 mm and < 2.0 mm): 0                          Isolated Tumor Cells (<0.2 mm): 0  Laterality of Lymph Node with Tumor: Left                          Extracapsular Extension: Not identified      Para-aortic Lymph Nodes Examined:                                   0 Sentinel                                   0 non-sentinel                                   0 total Distant Metastasis:      Distant Site(s) Involved: Not applicable Pathologic Stage Classification (pTNM, AJCC 8th Edition): pT1a, pN1 Ancillary Studies: MMR / MSI testing will be ordered Representative Tumor Block: C1 Comment(s): None  ([id number])       01/13/2022 Procedure   Placement of single lumen port a cath via right internal jugular vein. The catheter tip lies at the cavo-atrial junction. A power injectable port a cath was placed and is ready for immediate use.     01/19/2022 Echocardiogram    1. Left ventricular ejection fraction, by estimation, is 60 to 65%. The left ventricle has normal function. The left ventricle has no regional wall motion abnormalities. Left ventricular diastolic parameters are consistent with Grade I diastolic dysfunction (impaired relaxation). The average left ventricular global longitudinal strain is -24.0 %. The global longitudinal strain is normal.  2. Right ventricular systolic function is normal. The right ventricular size is normal. There is normal pulmonary artery systolic pressure.  3. Left atrial size was mildly dilated.  4. The mitral valve is normal in structure. No evidence of mitral valve regurgitation. No evidence of mitral stenosis.  5. The aortic valve was not well visualized. Aortic valve regurgitation is not visualized. Aortic valve sclerosis is present,  with no evidence of aortic valve stenosis.  6. The inferior vena cava is normal in size with greater than 50% respiratory variability, suggesting right atrial pressure of 3 mmHg.     01/27/2022 Tumor Marker   Patient's tumor was tested for the following markers: CA-125. Results of the tumor marker test revealed 307.   02/14/2022 -  Chemotherapy   Patient is on Treatment Plan : UTERINE SEROUS CARCINOMA Carboplatin + Paclitaxel + Trastuzumab q21d x 6 Cycles / Trastuzumab q21d     02/15/2022 Tumor Marker   Patient's tumor was tested for the following markers: CA-125. Results of the tumor marker test revealed 161.   03/29/2022 Tumor Marker   Patient's tumor was tested for the following markers: CA-125. Results of the tumor marker test revealed 30.   04/13/2022 Echocardiogram   1. Left ventricular ejection fraction, by estimation, is 60 to 65%. The left ventricle has normal function. The left ventricle has no regional wall motion abnormalities. Left ventricular diastolic parameters are consistent with Grade I diastolic dysfunction (impaired relaxation). The average left ventricular global longitudinal strain is -19.4 %. The global longitudinal strain is normal.  2. Right ventricular systolic function is normal. The right ventricular size is normal. There is normal pulmonary artery systolic pressure. The estimated right ventricular systolic pressure is 21.0 mmHg.  3. Left atrial size  was mild to moderately dilated.  4. The mitral valve is grossly normal. Trivial mitral valve regurgitation.  5. The aortic valve is tricuspid. Aortic valve regurgitation is not visualized. Aortic valve sclerosis is present, with no evidence of aortic valve stenosis.  6. The inferior vena cava is normal in size with greater than 50% respiratory variability, suggesting right atrial pressure of 3 mmHg.     PHYSICAL EXAMINATION: ECOG PERFORMANCE STATUS: 0 - Asymptomatic  Vitals:   05/09/22 0956  BP: (!) 173/75  Pulse:  80  Resp: 18  Temp: 98.1 F (36.7 C)  SpO2: 99%   Filed Weights   05/09/22 0956  Weight: 148 lb 12.8 oz (67.5 kg)    GENERAL:alert, no distress and comfortable NEURO: alert & oriented x 3 with fluent speech, no focal motor/sensory deficits  LABORATORY DATA:  I have reviewed the data as listed    Component Value Date/Time   NA 140 05/09/2022 0939   NA 140 11/02/2016 1130   K 3.8 05/09/2022 0939   K 3.8 11/02/2016 1130   CL 107 05/09/2022 0939   CL 105 05/02/2012 0822   CO2 24 05/09/2022 0939   CO2 26 11/02/2016 1130   GLUCOSE 124 (H) 05/09/2022 0939   GLUCOSE 87 11/02/2016 1130   GLUCOSE 98 05/02/2012 0822   BUN 24 (H) 05/09/2022 0939   BUN 14.7 11/02/2016 1130   CREATININE 1.04 (H) 05/09/2022 0939   CREATININE 0.84 12/05/2018 1014   CREATININE 0.8 11/02/2016 1130   CALCIUM 9.4 05/09/2022 0939   CALCIUM 9.7 11/02/2016 1130   PROT 7.0 05/09/2022 0939   PROT 6.8 11/02/2016 1130   ALBUMIN 3.9 05/09/2022 0939   ALBUMIN 3.6 11/02/2016 1130   AST 24 05/09/2022 0939   AST 23 11/02/2016 1130   ALT 17 05/09/2022 0939   ALT 17 11/02/2016 1130   ALKPHOS 106 05/09/2022 0939   ALKPHOS 59 11/02/2016 1130   BILITOT 0.3 05/09/2022 0939   BILITOT 0.34 11/02/2016 1130   GFRNONAA 56 (L) 05/09/2022 0939   GFRAA >60 11/08/2017 0504   GFRAA >60 07/25/2017 1425    No results found for: "SPEP", "UPEP"  Lab Results  Component Value Date   WBC 4.5 05/09/2022   NEUTROABS 3.7 05/09/2022   HGB 9.6 (L) 05/09/2022   HCT 28.7 (L) 05/09/2022   MCV 92.9 05/09/2022   PLT 141 (L) 05/09/2022      Chemistry      Component Value Date/Time   NA 140 05/09/2022 0939   NA 140 11/02/2016 1130   K 3.8 05/09/2022 0939   K 3.8 11/02/2016 1130   CL 107 05/09/2022 0939   CL 105 05/02/2012 0822   CO2 24 05/09/2022 0939   CO2 26 11/02/2016 1130   BUN 24 (H) 05/09/2022 0939   BUN 14.7 11/02/2016 1130   CREATININE 1.04 (H) 05/09/2022 0939   CREATININE 0.84 12/05/2018 1014   CREATININE 0.8  11/02/2016 1130      Component Value Date/Time   CALCIUM 9.4 05/09/2022 0939   CALCIUM 9.7 11/02/2016 1130   ALKPHOS 106 05/09/2022 0939   ALKPHOS 59 11/02/2016 1130   AST 24 05/09/2022 0939   AST 23 11/02/2016 1130   ALT 17 05/09/2022 0939   ALT 17 11/02/2016 1130   BILITOT 0.3 05/09/2022 0939   BILITOT 0.34 11/02/2016 1130

## 2022-05-09 NOTE — Assessment & Plan Note (Signed)
Blood pressure is generally high here but normal at home We will monitor carefully

## 2022-05-09 NOTE — Assessment & Plan Note (Signed)
She has slight worsening pancytopenia due to treatment She is not symptomatic We will proceed with treatment without delay

## 2022-05-09 NOTE — Assessment & Plan Note (Signed)
She will be completing treatment soon I will reach out to genetic counselor to see if she qualifies for genetic testing The plan will be to repeat imaging study after cycle 6 She would benefit from long-term treatment with trastuzumab

## 2022-05-09 NOTE — Patient Instructions (Signed)
[facility]  Discharge Instructions: Thank you for choosing [facility] to provide your oncology and hematology care.   If you have a lab appointment with the Cancer Center, please go directly to the Cancer Center and check in at the registration area.   Wear comfortable clothing and clothing appropriate for easy access to any Portacath or PICC line.   We strive to give you quality time with your provider. You may need to reschedule your appointment if you arrive late (15 or more minutes).  Arriving late affects you and other patients whose appointments are after yours.  Also, if you miss three or more appointments without notifying the office, you may be dismissed from the clinic at the provider's discretion.      For prescription refill requests, have your pharmacy contact our office and allow 72 hours for refills to be completed.    Today you received the following chemotherapy and/or immunotherapy agents: Trastuzumab, Paclitaxel (Taxol) and Carboplatin.   To help prevent nausea and vomiting after your treatment, we encourage you to take your nausea medication as directed.  BELOW ARE SYMPTOMS THAT SHOULD BE REPORTED IMMEDIATELY: *FEVER GREATER THAN 100.4 F (38 C) OR HIGHER *CHILLS OR SWEATING *NAUSEA AND VOMITING THAT IS NOT CONTROLLED WITH YOUR NAUSEA MEDICATION *UNUSUAL SHORTNESS OF BREATH *UNUSUAL BRUISING OR BLEEDING *URINARY PROBLEMS (pain or burning when urinating, or frequent urination) *BOWEL PROBLEMS (unusual diarrhea, constipation, pain near the anus) TENDERNESS IN MOUTH AND THROAT WITH OR WITHOUT PRESENCE OF ULCERS (sore throat, sores in mouth, or a toothache) UNUSUAL RASH, SWELLING OR PAIN  UNUSUAL VAGINAL DISCHARGE OR ITCHING   Items with * indicate a potential emergency and should be followed up as soon as possible or go to the Emergency Department if any problems should occur.  Please show the CHEMOTHERAPY ALERT CARD  or IMMUNOTHERAPY ALERT CARD at check-in to the Emergency Department and triage nurse.  Should you have questions after your visit or need to cancel or reschedule your appointment, please contact [place] [facility]  Dept: [phone]  and follow the prompts.  Office hours are 8:00 a.m. to 4:30 p.m. Monday - Friday. Please note that voicemails left after 4:00 p.m. may not be returned until the following business day.  We are closed weekends and major holidays. You have access to a nurse at all times for urgent questions. Please call the main number to the clinic Dept: [phone] and follow the prompts.   For any non-urgent questions, you may also contact your provider using MyChart. We now offer e-Visits for anyone 24 and older to request care online for non-urgent symptoms. For details visit [email or web address].   Also download the MyChart app! Go to the app store, search "MyChart", open the app, select [place], and log in with your MyChart username and password.

## 2022-05-12 ENCOUNTER — Encounter: Payer: Self-pay | Admitting: Oncology

## 2022-05-12 ENCOUNTER — Telehealth: Payer: Self-pay | Admitting: Oncology

## 2022-05-12 DIAGNOSIS — C549 Malignant neoplasm of corpus uteri, unspecified: Secondary | ICD-10-CM

## 2022-05-12 NOTE — Telephone Encounter (Signed)
Left a message for Kayla Mcgrath with appointment for genetic counseling on 05/24/22 at 2:00.  Requested a return call to confirm.

## 2022-05-12 NOTE — Progress Notes (Signed)
Referral for genetic counseling placed.

## 2022-05-20 ENCOUNTER — Telehealth: Payer: Self-pay | Admitting: Oncology

## 2022-05-20 NOTE — Telephone Encounter (Signed)
Called Kayla Mcgrath and rescheduled her genetics appointment to 05/26/22 at 1:00.  She verbalized understanding and agreement of new appointment.

## 2022-05-26 ENCOUNTER — Encounter: Payer: Self-pay | Admitting: Genetic Counselor

## 2022-05-26 ENCOUNTER — Inpatient Hospital Stay: Payer: Medicare HMO | Attending: Hematology and Oncology | Admitting: Genetic Counselor

## 2022-05-26 ENCOUNTER — Other Ambulatory Visit: Payer: Self-pay

## 2022-05-26 DIAGNOSIS — Z90722 Acquired absence of ovaries, bilateral: Secondary | ICD-10-CM | POA: Insufficient documentation

## 2022-05-26 DIAGNOSIS — Z5112 Encounter for antineoplastic immunotherapy: Secondary | ICD-10-CM | POA: Insufficient documentation

## 2022-05-26 DIAGNOSIS — Z853 Personal history of malignant neoplasm of breast: Secondary | ICD-10-CM | POA: Diagnosis not present

## 2022-05-26 DIAGNOSIS — Z8049 Family history of malignant neoplasm of other genital organs: Secondary | ICD-10-CM | POA: Diagnosis not present

## 2022-05-26 DIAGNOSIS — C549 Malignant neoplasm of corpus uteri, unspecified: Secondary | ICD-10-CM | POA: Diagnosis not present

## 2022-05-26 DIAGNOSIS — Z800 Family history of malignant neoplasm of digestive organs: Secondary | ICD-10-CM

## 2022-05-26 DIAGNOSIS — N183 Chronic kidney disease, stage 3 unspecified: Secondary | ICD-10-CM | POA: Insufficient documentation

## 2022-05-26 DIAGNOSIS — Z87891 Personal history of nicotine dependence: Secondary | ICD-10-CM | POA: Diagnosis not present

## 2022-05-26 DIAGNOSIS — D61818 Other pancytopenia: Secondary | ICD-10-CM | POA: Insufficient documentation

## 2022-05-26 DIAGNOSIS — C775 Secondary and unspecified malignant neoplasm of intrapelvic lymph nodes: Secondary | ICD-10-CM | POA: Insufficient documentation

## 2022-05-26 DIAGNOSIS — Z90710 Acquired absence of both cervix and uterus: Secondary | ICD-10-CM | POA: Insufficient documentation

## 2022-05-26 DIAGNOSIS — Z5111 Encounter for antineoplastic chemotherapy: Secondary | ICD-10-CM | POA: Insufficient documentation

## 2022-05-26 NOTE — Progress Notes (Signed)
REFERRING PROVIDER: Carver Fila, MD [address] [place],  [place] [address]  PRIMARY PROVIDER:  Carver Fila, MD  PRIMARY REASON FOR VISIT:  1. Family history of stomach cancer   2. History of breast cancer in female   3. Carcinosarcoma of body of uterus (HCC)      HISTORY OF PRESENT ILLNESS:   Kayla Mcgrath, a 77 y.o. female, was seen for a  cancer genetics consultation at the request of Dr. Pricilla Holm due to a personal and family history of cancer.  Kayla Mcgrath presents to clinic today to discuss the possibility of a hereditary predisposition to cancer, genetic testing, and to further clarify her future cancer risks, as well as potential cancer risks for family members.   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 The treatment plan lumpectomy and radiation was declined.  In 2014, at the age of 49, Kayla Mcgrath was diagnosed with breast cancer of the right breast.  In 2024, Kayla Mcgrath was diagnosed with uterine carcinosarcoma.     CANCER HISTORY:  Oncology History Overview Note  MMR normal MSI stable PD-L1 CPS: 1% Her2 positive P53 + carcinosarcoma   Carcinosarcoma of body of uterus (HCC)  09/23/2021 Initial Diagnosis   She presented with PMB   09/28/2021 Imaging   US pelvis Enlarged uterus with fibroids. Pelvic sonogram is otherwise unremarkable.    11/11/2021 Pathology Results   A. CERVIX, MASS, EXCISION:  - HIGH-GRADE UNDIFFERENTIATED MALIGNANCY WITH EXTENSIVE NECROSIS (SEE COMMENT).   COMMENT:  The tumor consists of mostly high-grade sarcomatoid morphology with focal epithelioid morphology.  Immunohistochemical stains are performed on block A4.  The tumor cells are positive for desmin and p16 while negative for CD45, CK7, CK20, chromogranin, synaptophysin, Melan-A, p40, AE1/AE3, SMA, p63, cyclin D1, S100 and smooth muscle myosin.  Ki-67 proliferation index is high.  CD10 staining shows focal nonspecific staining.  The  differential diagnosis includes a high-grade sarcoma and a partially sampled carcinosarcoma.    11/25/2021 Imaging   MRI pelvis Marked distention of the entire endometrial cavity by heterogeneously enhancing soft tissue density, which extends through the endocervical canal into the lower vagina. This is highly suspicious for endometrial carcinoma.   Diffuse myometrial thinning due to marked distention of endometrial cavity limits evaluation; deep myometrial invasion cannot be excluded in the uterine fundus.   No evidence of extra-uterine extension of tumor.   Lymphadenopathy in lower abdominal retroperitoneum, bilateral iliac chains, and sigmoid mesocolon, consistent with metastatic disease.   Sigmoid diverticulosis. No radiographic evidence of diverticulitis.   11/26/2021 Initial Diagnosis   Uterine cancer (HCC)   11/26/2021 Cancer Staging   Staging form: Corpus Uteri - Carcinoma and Carcinosarcoma, AJCC 8th Edition - Clinical stage from 11/26/2021: FIGO Stage IIIC2 (cT3, cN2, cM0) - Signed by Artis Delay, MD on 11/26/2021 Stage prefix: Initial diagnosis Histologic grade (G): G3 Histologic grading system: 3 grade system   11/30/2021 Pathology Results   FINAL MICROSCOPIC DIAGNOSIS:  A. UTERINE CONTENTS, BIOPSY: - HIGH-GRADE UNDIFFERENTIATED MALIGNANCY WITH EXTENSIVE NECROSIS (SEE COMMENT).  COMMENT: The patient's history of a high-grade undifferentiated malignancy with extensive necrosis of the cervix is noted.  The morphological features of the current tumor cells are similar to the tumor cells seen in the previous cervical mass excision.  Immunohistochemical staining for desmin and cytokeratin AE1/AE3 is performed on block A1.  The tumor cells are diffusely positive for desmin.   AE1/AE3 stain is focally positive, however this  is scant and of unclear clinical significance. Given the small quantity of tissue in the current biopsy, a definitive distinction between a high-grade sarcoma  and carcinosarcoma cannot be made as this biopsy may not be representative of the entire tumor. If clinically indicated, this case as well as the previous cervical mass excision ([id number]) may be sent out for expert consultation. Clinical correlation recommended.     12/01/2021 Tumor Marker   Patient's tumor was tested for the following markers: CA-125. Results of the tumor marker test revealed 491.   12/22/2021 Surgery   Robotic-assisted laparoscopic total hysterectomy with bilateral salpingo-oophorectomy, tumor debulking including high common iliac lymph node excision, aborted attempt of bilateral external iliac enlarged lymph nodes, mini-lap for specimen delivery, cystoscopy    12/22/2021 Pathology Results   FINAL MICROSCOPIC DIAGNOSIS:  A. LEFT CERVICAL MARGIN, EXCISION: - Positive for carcinoma  B. HIGH LEFT COMMON ILIAC LYMPH NODE, EXCISION: - Metastatic carcinosarcoma involving the lymph node  C. 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, including undifferentiated sarcoma with rhabdoid features and high-grade serous carcinoma, see comment - Tumor invades for a depth of 1 mm where myometrial thickness is 5 mm - Benign bilateral fallopian tubes and ovaries - See oncology table - See comment  ONCOLOGY TABLE:  UTERUS, CARCINOMA OR CARCINOSARCOMA: Resection  Procedure: Total hysterectomy and bilateral salpingo-oophorectomy Histologic Type: Carcinosarcoma (mixed malignant Mullerian tumor) Histologic Grade: High-grade Myometrial Invasion:      Depth of Myometrial Invasion (mm): 1 mm      Myometrial Thickness (mm): 5 mm      Percentage of Myometrial Invasion: 20% Uterine Serosa Involvement: Not identified Cervical stromal Involvement: Present Extent of involvement of other tissue/organs: Not identified Peritoneal/Ascitic Fluid: Not applicable Lymphovascular Invasion: Present Regional  Lymph Nodes:      Pelvic Lymph Nodes Examined:                                  0 Sentinel                                  1 Non-sentinel                                  1 Total      Pelvic Lymph Nodes with Metastasis: 1                          Macrometastasis: (>2.0 mm): 1                          Micrometastasis: (>0.2 mm and < 2.0 mm): 0                          Isolated Tumor Cells (<0.2 mm): 0                          Laterality of Lymph Node with Tumor: Left                          Extracapsular Extension: Not identified      Para-aortic Lymph Nodes Examined:  0 Sentinel                                   0 non-sentinel                                   0 total Distant Metastasis:      Distant Site(s) Involved: Not applicable Pathologic Stage Classification (pTNM, AJCC 8th Edition): pT1a, pN1 Ancillary Studies: MMR / MSI testing will be ordered Representative Tumor Block: C1 Comment(s): None  ([id number])       01/13/2022 Procedure   Placement of single lumen port a cath via right internal jugular vein. The catheter tip lies at the cavo-atrial junction. A power injectable port a cath was placed and is ready for immediate use.     01/19/2022 Echocardiogram    1. Left ventricular ejection fraction, by estimation, is 60 to 65%. The left ventricle has normal function. The left ventricle has no regional wall motion abnormalities. Left ventricular diastolic parameters are consistent with Grade I diastolic dysfunction (impaired relaxation). The average left ventricular global longitudinal strain is -24.0 %. The global longitudinal strain is normal.  2. Right ventricular systolic function is normal. The right ventricular size is normal. There is normal pulmonary artery systolic pressure.  3. Left atrial size was mildly dilated.  4. The mitral valve is normal in structure. No evidence of mitral valve regurgitation. No evidence of mitral stenosis.  5.  The aortic valve was not well visualized. Aortic valve regurgitation is not visualized. Aortic valve sclerosis is present, with no evidence of aortic valve stenosis.  6. The inferior vena cava is normal in size with greater than 50% respiratory variability, suggesting right atrial pressure of 3 mmHg.     01/27/2022 Tumor Marker   Patient's tumor was tested for the following markers: CA-125. Results of the tumor marker test revealed 307.   02/14/2022 -  Chemotherapy   Patient is on Treatment Plan : UTERINE SEROUS CARCINOMA Carboplatin + Paclitaxel + Trastuzumab q21d x 6 Cycles / Trastuzumab q21d     02/15/2022 Tumor Marker   Patient's tumor was tested for the following markers: CA-125. Results of the tumor marker test revealed 161.   03/29/2022 Tumor Marker   Patient's tumor was tested for the following markers: CA-125. Results of the tumor marker test revealed 30.   04/13/2022 Echocardiogram   1. Left ventricular ejection fraction, by estimation, is 60 to 65%. The left ventricle has normal function. The left ventricle has no regional wall motion abnormalities. Left ventricular diastolic parameters are consistent with Grade I diastolic dysfunction (impaired relaxation). The average left ventricular global longitudinal strain is -19.4 %. The global longitudinal strain is normal.  2. Right ventricular systolic function is normal. The right ventricular size is normal. There is normal pulmonary artery systolic pressure. The estimated right ventricular systolic pressure is 21.0 mmHg.  3. Left atrial size was mild to moderately dilated.  4. The mitral valve is grossly normal. Trivial mitral valve regurgitation.  5. The aortic valve is tricuspid. Aortic valve regurgitation is not visualized. Aortic valve sclerosis is present, with no evidence of aortic valve stenosis.  6. The inferior vena cava is normal in size with greater than 50% respiratory variability, suggesting right atrial pressure of 3 mmHg.       RISK FACTORS:  Menarche was at age 31.  First live birth at age 16.  OCP use for approximately 0 years.  Ovaries intact: yes.  Hysterectomy: no.  Menopausal status: postmenopausal.  HRT use: 0 years. Colonoscopy: yes; normal. Mammogram within the last year: yes. Number of breast biopsies: 2. Up to date with pelvic exams: n/a. Any excessive radiation exposure in the past: no  Past Medical History:  Diagnosis Date   Adjustment disorder with anxious mood 12/05/2018   Anemia    Anxiety    Arthralgia 06/29/2015   Arthritis    Breast cancer (HCC) 2010   Colon polyps    ?   Complication of anesthesia    DCIS (ductal carcinoma in situ) of breast 2010   Depression    Diverticulosis    Family history of stomach cancer    Full dentures    Hyperlipidemia    Joint pain    Leukocytosis 07/11/2017   PONV (postoperative nausea and vomiting)    Woke up during surgery   Skin cancer    SCC   Wears glasses     Past Surgical History:  Procedure Laterality Date   BREAST BIOPSY Right 05/11/2012   BREAST BIOPSY Right 04/24/2012   BREAST LUMPECTOMY Right 12/2008   cancer-tamoxifen   BREAST RECONSTRUCTION WITH PLACEMENT OF TISSUE EXPANDER AND FLEX HD (ACELLULAR HYDRATED DERMIS) Right 06/20/2012   Procedure: BREAST RECONSTRUCTION WITH PLACEMENT OF TISSUE EXPANDER AND FLEX HD (ACELLULAR HYDRATED DERMIS);  Surgeon: Wayland Denis, DO;  Location: [facility];  Service: Plastics;  Laterality: Right;   BREAST REDUCTION WITH MASTOPEXY Left 10/11/2012   Procedure: LEFT BREAST REDUCTION WITH MASTOPEXY;  Surgeon: Wayland Denis, DO;  Location: [facility];  Service: Plastics;  Laterality: Left;   cataract sugery Bilateral 03/2019   COLONOSCOPY  08/28/2013   [facility]   DILATION AND CURETTAGE OF UTERUS     heavy bleeding between two pregnancies   IR IMAGING GUIDED PORT INSERTION  01/12/2022   LAPAROTOMY N/A 01/12/2022   Procedure: incision drainage of abdominal incision  wound;  Surgeon: Antionette Char, MD;  Location: [facility];  Service: Gynecology;  Laterality: N/A;   LIPOSUCTION WITH LIPOFILLING Left 10/11/2012   Procedure: LIPOSUCTION WITH LIPOFILLING;  Surgeon: Wayland Denis, DO;  Location: [facility];  Service: Plastics;  Laterality: Left;   LYMPH NODE DISSECTION N/A 12/22/2021   Procedure: LYMPH NODE DISSECTION;  Surgeon: Carver Fila, MD;  Location: [facility];  Service: Gynecology;  Laterality: N/A;   MASTECTOMY Right 06/20/2012   MASTECTOMY W/ SENTINEL NODE BIOPSY Right 06/20/2012   Procedure: right skin sparing MASTECTOMY WITH SENTINEL LYMPH NODE BIOPSY;  Surgeon: Almond Lint, MD;  Location: [facility];  Service: General;  Laterality: Right;   PARATHYROIDECTOMY Left 11/07/2017   Procedure: NECK EXPLORATION WITH LEFT THYROID LOBECTOMY;  Surgeon: Darnell Level, MD;  Location: [facility];  Service: General;  Laterality: Left;   POLYPECTOMY     REDUCTION MAMMAPLASTY Left 10/2012   REMOVAL OF TISSUE EXPANDER AND PLACEMENT OF IMPLANT Right 10/11/2012   Procedure: REMOVAL OF TISSUE EXPANDER AND PLACEMENT OF SILICONE IMPLANT RIGHT;  Surgeon: Wayland Denis, DO;  Location: [facility];  Service: Plastics;  Laterality: Right;    Social History   Socioeconomic History   Marital status: Married    Spouse name: Not on file   Number of children: 2   Years of education: Not on file   Highest education level: Not on file  Occupational History   Occupation: retired Public house manager  Tobacco Use   Smoking status: Former    Types: Cigarettes    Quit date: 06/14/1981    Years since quitting: 40.9   Smokeless tobacco: Never  Vaping Use   Vaping Use: Never used  Substance and Sexual Activity   Alcohol use: No   Drug use: No   Sexual activity: Not Currently    Birth control/protection: Post-menopausal  Other Topics Concern   Not on file  Social History Narrative   Recently moved from [place] to be closer to her two daughters and  grandchildren.   Retired Public house manager.   DNR- forms singed on 05/07/2012 and returned to pt.   Social Determinants of Health   Financial Resource Strain: Low Risk  (02/02/2021)   Overall Financial Resource Strain (CARDIA)    Difficulty of Paying Living Expenses: Not hard at all  Food Insecurity: No Food Insecurity (11/30/2021)   Hunger Vital Sign    Worried About Running Out of Food in the Last Year: Never true    Ran Out of Food in the Last Year: Never true  Transportation Needs: No Transportation Needs (11/30/2021)   PRAPARE - Administrator, Civil Service (Medical): No    Lack of Transportation (Non-Medical): No  Physical Activity: Insufficiently Active (02/02/2021)   Exercise Vital Sign    Days of Exercise per Week: 3 days    Minutes of Exercise per Session: 30 min  Stress: No Stress Concern Present (02/02/2021)   [employer] of Occupational Health - Occupational Stress Questionnaire    Feeling of Stress : Not at all  Social Connections: Moderately Integrated (02/02/2021)   Social Connection and Isolation Panel [NHANES]    Frequency of Communication with Friends and Family: Twice a week    Frequency of Social Gatherings with Friends and Family: Twice a week    Attends Religious Services: Never    Database administrator or Organizations: Yes    Attends Engineer, structural: More than 4 times per year    Marital Status: Married     FAMILY HISTORY:  We obtained a detailed, 4-generation family history.  Significant diagnoses are listed below: Family History  Problem Relation Age of Onset   Alcohol abuse Mother    Colon polyps Mother    Mental illness Mother    Alcohol abuse Father        suicide   Mental illness Father    Cervical cancer Sister 64   Stomach cancer Paternal Grandmother    Heart disease Paternal Grandfather    Colon cancer Neg Hx    Esophageal cancer Neg Hx    Rectal cancer Neg Hx    Breast cancer Neg Hx    Ovarian cancer Neg Hx     Endometrial cancer Neg Hx    Pancreatic cancer Neg Hx    Prostate cancer Neg Hx      The patient has two daughters who are cancer free.  She has two sisters and a brother, one sister had cervical cancer at 94.  Both parents are deceased.  The patient's mother had 5 siblings who were reported to be cancer free.  The maternal grandparents died of non-cancer related issues.  The patient's father died of suicide.  He had a sister who was cancer free.  His mother and maternal uncle both had stomach cancer.  The grandfather died of heart disease.  Kayla Mcgrath is unaware of previous family history of genetic testing for hereditary cancer risks. Patient's maternal ancestors are  of [place] descent, and paternal ancestors are of [place] and [place] descent. There is no reported Ashkenazi Jewish ancestry. There is no known consanguinity.  GENETIC COUNSELING ASSESSMENT: Kayla Mcgrath is a 77 y.o. female with a personal and family history of cancer which is somewhat suggestive of a hereditary cancer syndrome and predisposition to cancer given the number of cancers she has personally had. We, therefore, discussed and recommended the following at today's visit.   DISCUSSION: We discussed that, in general, most cancer is not inherited in families, but instead is sporadic or familial. Sporadic cancers occur by chance and typically happen at older ages (>50 years) as this type of cancer is caused by genetic changes acquired during an individual's lifetime. Some families have more cancers than would be expected by chance; however, the ages or types of cancer are not consistent with a known genetic mutation or known genetic mutations have been ruled out. This type of familial cancer is thought to be due to a combination of multiple genetic, environmental, hormonal, and lifestyle factors. While this combination of factors likely increases the risk of cancer, the exact source of this risk is not currently identifiable or  testable.  We discussed that 5 - 10% of breast cancer is hereditary, with most cases associated with BRCA mutations.  There are other genes that can be associated with hereditary breast cancer syndromes.  These include ATM, CHEK2 and PALB2.  We discussed that testing is beneficial for several reasons including knowing how to follow individuals after completing their treatment, identifying whether potential treatment options such as PARP inhibitors would be beneficial, and understand if other family members could be at risk for cancer and allow them to undergo genetic testing.   We reviewed the characteristics, features and inheritance patterns of hereditary cancer syndromes. We also discussed genetic testing, including the appropriate family members to test, the process of testing, insurance coverage and turn-around-time for results. We discussed the implications of a negative, positive, carrier and/or variant of uncertain significant result. Kayla Mcgrath  was offered a common hereditary cancer panel (47 genes) and an expanded pan-cancer panel (77 genes). Kayla Mcgrath was informed of the benefits and limitations of each panel, including that expanded pan-cancer panels contain genes that do not have clear management guidelines at this point in time.  We also discussed that as the number of genes included on a panel increases, the chances of variants of uncertain significance increases. Kayla Mcgrath decided to pursue genetic testing for the [facility] Multi-cancer + RNA gene panel.   The Multi-Cancer + RNA Panel offered by [facility] includes sequencing and/or deletion/duplication analysis of the following 70 genes:  AIP*, ALK, APC*, ATM*, AXIN2*, BAP1*, BARD1*, BLM*, BMPR1A*, BRCA1*, BRCA2*, BRIP1*, CDC73*, CDH1*, CDK4, CDKN1B*, CDKN2A, CHEK2*, CTNNA1*, DICER1*, EPCAM (del/dup only), EGFR, FH*, FLCN*, GREM1 (promoter dup only), HOXB13, KIT, LZTR1, MAX*, MBD4, MEN1*, MET, MITF, MLH1*, MSH2*, MSH3*, MSH6*, MUTYH*, NF1*,  NF2*, NTHL1*, PALB2*, PDGFRA, PMS2*, POLD1*, POLE*, POT1*, PRKAR1A*, PTCH1*, PTEN*, RAD51C*, RAD51D*, RB1*, RET, SDHA* (sequencing only), SDHAF2*, SDHB*, SDHC*, SDHD*, SMAD4*, SMARCA4*, SMARCB1*, SMARCE1*, STK11*, SUFU*, TMEM127*, TP53*, TSC1*, TSC2*, VHL*. RNA analysis is performed for * genes.   Based on Kayla Mcgrath's personal and family history of cancer, she meets medical criteria for genetic testing. Despite that she meets criteria, she may still have an out of pocket cost. We discussed that if her out of pocket cost for testing is over $100, the laboratory will call and confirm whether she wants to proceed with testing.  If the out of pocket cost of testing is less than $100 she will be billed by the genetic testing laboratory.   PLAN: After considering the risks, benefits, and limitations, Kayla Mcgrath provided informed consent to pursue genetic testing.  A blood sample will be obtained on 05/30/2022 and be sent to [facility] for analysis of the Multi-cancer + RNA panel. Results should be available within approximately 2-3 weeks' time, at which point they will be disclosed by telephone to Kayla Mcgrath, as will any additional recommendations warranted by these results. Kayla Mcgrath will receive a summary of her genetic counseling visit and a copy of her results once available. This information will also be available in Epic.   Lastly, we encouraged Kayla Mcgrath to remain in contact with cancer genetics annually so that we can continuously update the family history and inform her of any changes in cancer genetics and testing that may be of benefit for this family.   Kayla Mcgrath questions were answered to her satisfaction today. Our contact information was provided should additional questions or concerns arise. Thank you for the referral and allowing [place] to share in the care of your patient.   Laketia Vicknair P. Lowell Guitar, MS, [facility] Licensed, Patent attorney [email or web address] phone:  [phone]  The patient was seen for a total of 35 minutes in face-to-face genetic counseling.  The patient was seen alone.  Drs. Meliton Rattan, and/or [place] were available for questions, if needed..    _______________________________________________________________________ For Office Staff:  Number of people involved in session: 1 Was an Intern/ student involved with case: no

## 2022-05-27 MED FILL — Fosaprepitant Dimeglumine For IV Infusion 150 MG (Base Eq): INTRAVENOUS | Qty: 5 | Status: AC

## 2022-05-27 MED FILL — Dexamethasone Sodium Phosphate Inj 100 MG/10ML: INTRAMUSCULAR | Qty: 1 | Status: AC

## 2022-05-30 ENCOUNTER — Inpatient Hospital Stay: Payer: Medicare HMO

## 2022-05-30 ENCOUNTER — Encounter: Payer: Self-pay | Admitting: Hematology and Oncology

## 2022-05-30 ENCOUNTER — Other Ambulatory Visit: Payer: Self-pay

## 2022-05-30 ENCOUNTER — Inpatient Hospital Stay (HOSPITAL_BASED_OUTPATIENT_CLINIC_OR_DEPARTMENT_OTHER): Payer: Medicare HMO | Admitting: Hematology and Oncology

## 2022-05-30 ENCOUNTER — Other Ambulatory Visit: Payer: Self-pay | Admitting: Genetic Counselor

## 2022-05-30 VITALS — BP 157/74 | HR 85 | Temp 97.4°F | Resp 18 | Ht 59.5 in | Wt 146.8 lb

## 2022-05-30 DIAGNOSIS — N183 Chronic kidney disease, stage 3 unspecified: Secondary | ICD-10-CM

## 2022-05-30 DIAGNOSIS — C549 Malignant neoplasm of corpus uteri, unspecified: Secondary | ICD-10-CM

## 2022-05-30 DIAGNOSIS — C775 Secondary and unspecified malignant neoplasm of intrapelvic lymph nodes: Secondary | ICD-10-CM | POA: Diagnosis not present

## 2022-05-30 DIAGNOSIS — D61818 Other pancytopenia: Secondary | ICD-10-CM | POA: Diagnosis not present

## 2022-05-30 DIAGNOSIS — Z5112 Encounter for antineoplastic immunotherapy: Secondary | ICD-10-CM | POA: Diagnosis not present

## 2022-05-30 DIAGNOSIS — Z90710 Acquired absence of both cervix and uterus: Secondary | ICD-10-CM | POA: Diagnosis not present

## 2022-05-30 DIAGNOSIS — Z853 Personal history of malignant neoplasm of breast: Secondary | ICD-10-CM | POA: Diagnosis not present

## 2022-05-30 DIAGNOSIS — Z90722 Acquired absence of ovaries, bilateral: Secondary | ICD-10-CM | POA: Diagnosis not present

## 2022-05-30 DIAGNOSIS — Z800 Family history of malignant neoplasm of digestive organs: Secondary | ICD-10-CM | POA: Diagnosis not present

## 2022-05-30 DIAGNOSIS — Z5111 Encounter for antineoplastic chemotherapy: Secondary | ICD-10-CM | POA: Diagnosis not present

## 2022-05-30 LAB — CBC WITH DIFFERENTIAL (CANCER CENTER ONLY)
Abs Immature Granulocytes: 0.01 10*3/uL (ref 0.00–0.07)
Basophils Absolute: 0 10*3/uL (ref 0.0–0.1)
Basophils Relative: 0 %
Eosinophils Absolute: 0 10*3/uL (ref 0.0–0.5)
Eosinophils Relative: 1 %
HCT: 26.2 % — ABNORMAL LOW (ref 36.0–46.0)
Hemoglobin: 8.7 g/dL — ABNORMAL LOW (ref 12.0–15.0)
Immature Granulocytes: 0 %
Lymphocytes Relative: 29 %
Lymphs Abs: 1 10*3/uL (ref 0.7–4.0)
MCH: 31.5 pg (ref 26.0–34.0)
MCHC: 33.2 g/dL (ref 30.0–36.0)
MCV: 94.9 fL (ref 80.0–100.0)
Monocytes Absolute: 0.4 10*3/uL (ref 0.1–1.0)
Monocytes Relative: 13 %
Neutro Abs: 1.8 10*3/uL (ref 1.7–7.7)
Neutrophils Relative %: 57 %
Platelet Count: 133 10*3/uL — ABNORMAL LOW (ref 150–400)
RBC: 2.76 MIL/uL — ABNORMAL LOW (ref 3.87–5.11)
RDW: 19.4 % — ABNORMAL HIGH (ref 11.5–15.5)
WBC Count: 3.3 10*3/uL — ABNORMAL LOW (ref 4.0–10.5)
nRBC: 0 % (ref 0.0–0.2)

## 2022-05-30 LAB — CMP (CANCER CENTER ONLY)
ALT: 15 U/L (ref 0–44)
AST: 21 U/L (ref 15–41)
Albumin: 3.8 g/dL (ref 3.5–5.0)
Alkaline Phosphatase: 96 U/L (ref 38–126)
Anion gap: 8 (ref 5–15)
BUN: 23 mg/dL (ref 8–23)
CO2: 25 mmol/L (ref 22–32)
Calcium: 8.8 mg/dL — ABNORMAL LOW (ref 8.9–10.3)
Chloride: 107 mmol/L (ref 98–111)
Creatinine: 1.05 mg/dL — ABNORMAL HIGH (ref 0.44–1.00)
GFR, Estimated: 55 mL/min — ABNORMAL LOW (ref >60)
Glucose, Bld: 99 mg/dL (ref 70–99)
Potassium: 3.6 mmol/L (ref 3.5–5.1)
Sodium: 140 mmol/L (ref 135–145)
Total Bilirubin: 0.3 mg/dL (ref 0.3–1.2)
Total Protein: 6.7 g/dL (ref 6.5–8.1)

## 2022-05-30 LAB — GENETIC SCREENING ORDER

## 2022-05-30 MED ORDER — SODIUM CHLORIDE 0.9 % IV SOLN: Freq: Once | INTRAVENOUS | Status: AC

## 2022-05-30 MED ORDER — SODIUM CHLORIDE 0.9% FLUSH
10.0000 mL | Freq: Once | INTRAVENOUS | Status: AC
  Administered 2022-05-30: 10 mL

## 2022-05-30 MED ORDER — SODIUM CHLORIDE 0.9 % IV SOLN
175.0000 mg/m2 | Freq: Once | INTRAVENOUS | Status: AC
  Administered 2022-05-30: 294 mg via INTRAVENOUS
  Filled 2022-05-30: qty 49

## 2022-05-30 MED ORDER — HEPARIN SOD (PORK) LOCK FLUSH 100 UNIT/ML IV SOLN
500.0000 [IU] | Freq: Once | INTRAVENOUS | Status: AC | PRN
  Administered 2022-05-30: 500 [IU]

## 2022-05-30 MED ORDER — PALONOSETRON HCL INJECTION 0.25 MG/5ML
0.2500 mg | Freq: Once | INTRAVENOUS | Status: AC
  Administered 2022-05-30: 0.25 mg via INTRAVENOUS
  Filled 2022-05-30: qty 5

## 2022-05-30 MED ORDER — CETIRIZINE HCL 10 MG/ML IV SOLN
10.0000 mg | Freq: Once | INTRAVENOUS | Status: AC
  Administered 2022-05-30: 10 mg via INTRAVENOUS
  Filled 2022-05-30: qty 1

## 2022-05-30 MED ORDER — SODIUM CHLORIDE 0.9 % IV SOLN
10.0000 mg | Freq: Once | INTRAVENOUS | Status: AC
  Administered 2022-05-30: 10 mg via INTRAVENOUS
  Filled 2022-05-30: qty 10

## 2022-05-30 MED ORDER — SODIUM CHLORIDE 0.9% FLUSH
10.0000 mL | INTRAVENOUS | Status: DC | PRN
  Administered 2022-05-30: 10 mL

## 2022-05-30 MED ORDER — FAMOTIDINE 20 MG IN NS 100 ML IVPB
20.0000 mg | Freq: Once | INTRAVENOUS | Status: AC
  Administered 2022-05-30: 20 mg via INTRAVENOUS
  Filled 2022-05-30: qty 100

## 2022-05-30 MED ORDER — TRASTUZUMAB-ANNS CHEMO 150 MG IV SOLR
6.0000 mg/kg | Freq: Once | INTRAVENOUS | Status: AC
  Administered 2022-05-30: 420 mg via INTRAVENOUS
  Filled 2022-05-30: qty 20

## 2022-05-30 MED ORDER — SODIUM CHLORIDE 0.9 % IV SOLN
150.0000 mg | Freq: Once | INTRAVENOUS | Status: AC
  Administered 2022-05-30: 150 mg via INTRAVENOUS
  Filled 2022-05-30: qty 150

## 2022-05-30 MED ORDER — SODIUM CHLORIDE 0.9 % IV SOLN
368.0000 mg | Freq: Once | INTRAVENOUS | Status: AC
  Administered 2022-05-30: 370 mg via INTRAVENOUS
  Filled 2022-05-30: qty 37

## 2022-05-30 NOTE — Patient Instructions (Signed)
[place] [facility]  Discharge Instructions: Thank you for choosing [facility] to provide your oncology and hematology care.   If you have a lab appointment with the Cancer Center, please go directly to the Cancer Center and check in at the registration area.   Wear comfortable clothing and clothing appropriate for easy access to any Portacath or PICC line.   We strive to give you quality time with your provider. You may need to reschedule your appointment if you arrive late (15 or more minutes).  Arriving late affects you and other patients whose appointments are after yours.  Also, if you miss three or more appointments without notifying the office, you may be dismissed from the clinic at the provider's discretion.      For prescription refill requests, have your pharmacy contact our office and allow 72 hours for refills to be completed.    Today you received the following chemotherapy and/or immunotherapy agents Kanjinti, Paclitaxel, Carboplatin.      To help prevent nausea and vomiting after your treatment, we encourage you to take your nausea medication as directed.  BELOW ARE SYMPTOMS THAT SHOULD BE REPORTED IMMEDIATELY: *FEVER GREATER THAN 100.4 F (38 C) OR HIGHER *CHILLS OR SWEATING *NAUSEA AND VOMITING THAT IS NOT CONTROLLED WITH YOUR NAUSEA MEDICATION *UNUSUAL SHORTNESS OF BREATH *UNUSUAL BRUISING OR BLEEDING *URINARY PROBLEMS (pain or burning when urinating, or frequent urination) *BOWEL PROBLEMS (unusual diarrhea, constipation, pain near the anus) TENDERNESS IN MOUTH AND THROAT WITH OR WITHOUT PRESENCE OF ULCERS (sore throat, sores in mouth, or a toothache) UNUSUAL RASH, SWELLING OR PAIN  UNUSUAL VAGINAL DISCHARGE OR ITCHING   Items with * indicate a potential emergency and should be followed up as soon as possible or go to the Emergency Department if any problems should occur.  Please show the CHEMOTHERAPY ALERT CARD or  IMMUNOTHERAPY ALERT CARD at check-in to the Emergency Department and triage nurse.  Should you have questions after your visit or need to cancel or reschedule your appointment, please contact  CANC[facility] CENTER AT [facility]  Dept: [phone]  and follow the prompts.  Office hours are 8:00 a.m. to 4:30 p.m. Monday - Friday. Please note that voicemails left after 4:00 p.m. may not be returned until the following business day.  We are closed weekends and major holidays. You have access to a nurse at all times for urgent questions. Please call the main number to the clinic Dept: [phone] and follow the prompts.   For any non-urgent questions, you may also contact your provider using MyChart. We now offer e-Visits for anyone 45 and older to request care online for non-urgent symptoms. For details visit [email or web address].   Also download the MyChart app! Go to the app store, search "MyChart", open the app, select , and log in with your MyChart username and password.

## 2022-05-30 NOTE — Assessment & Plan Note (Signed)
She has slight worsening pancytopenia due to treatment She is not symptomatic We will proceed with treatment without delay 

## 2022-05-30 NOTE — Assessment & Plan Note (Addendum)
She will complete cycle 6 today Treatment course is complicated by slight worsening pancytopenia but she is not symptomatic I plan to order imaging study to be done in 2 weeks She will then begin treatment with adjuvant trastuzumab only assuming CT imaging is showing no evidence of disease

## 2022-05-30 NOTE — Assessment & Plan Note (Signed)
She has history of intermittent kidney failure Monitor closely and we will adjust the dose of chemotherapy accordingly

## 2022-05-30 NOTE — Progress Notes (Signed)
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[employer]) She will complete cycle 6 today Treatment course is complicated by slight worsening pancytopenia but she is not symptomatic I plan to order imaging study to be done in 2 weeks She will then begin treatment with adjuvant trastuzumab only assuming CT imaging is showing no evidence of disease  CKD (chronic kidney disease), stage III (HCC) She has history of intermittent kidney failure Monitor closely and we will adjust the dose of chemotherapy accordingly  Pancytopenia, acquired [facility]) She has slight worsening pancytopenia due to treatment She is not symptomatic We will proceed with treatment without delay  Orders Placed This Encounter  Procedures   CT ABDOMEN PELVIS W CONTRAST    Standing Status:   Future    Standing Expiration Date:   05/30/2023    Order Specific Question:   If indicated for the ordered procedure, I authorize the administration of contrast media per Radiology protocol    Answer:   Yes    Order Specific Question:   Does the patient have a contrast media/X-ray dye allergy?    Answer:   No    Order Specific Question:   Preferred imaging location?    Answer:   [facility]    Order Specific Question:   If indicated for the ordered procedure, I authorize the administration of oral contrast media per Radiology protocol    Answer:   Yes   Comprehensive metabolic panel    Standing Status:   Standing    Number of Occurrences:   33    Standing Expiration Date:   05/30/2023   CBC with Differential/Platelet    Standing Status:   Standing    Number of Occurrences:   22    Standing Expiration Date:   05/30/2023    All questions were answered. The patient knows to call the clinic with any problems, questions or  concerns. The total time spent in the appointment was 25 minutes encounter with patients including review of chart and various tests results, discussions about plan of care and coordination of care plan   Artis Delay, MD 05/30/2022 9:54 AM  INTERVAL HISTORY: Please see below for problem oriented charting. she returns for treatment follow-up seen prior to cycle 6 of treatment She had 1 episode of nausea but no vomiting Denies constipation or peripheral neuropathy She had 1 episode of shortness of breath but it was related to humid weather No recent chest pain or dizziness  REVIEW OF SYSTEMS:   Constitutional: Denies fevers, chills or abnormal weight loss Eyes: Denies blurriness of vision Ears, nose, mouth, throat, and face: Denies mucositis or sore throat Cardiovascular: Denies palpitation, chest discomfort or lower extremity swelling Gastrointestinal:  Denies nausea, heartburn or change in bowel habits Skin: Denies abnormal skin rashes Lymphatics: Denies new lymphadenopathy or easy bruising Neurological:Denies numbness, tingling or new weaknesses Behavioral/Psych: Mood is stable, no new changes  All other systems were reviewed with the patient and are negative.  I have reviewed the past medical history, past surgical history, social history and family history with the patient and they are unchanged from previous note.  ALLERGIES:  is allergic to tylenol [acetaminophen] and latex.  MEDICATIONS:  Current Outpatient Medications  Medication Sig Dispense Refill   Cyanocobalamin (VITAMIN B 12 PO) Take 1 tablet by mouth.  dexamethasone (DECADRON) 4 MG tablet Take 2 tabs at the night before and 2 tab the morning of chemotherapy, every 3 weeks, by mouth x 6 cycles 24 tablet 6   escitalopram (LEXAPRO) 10 MG tablet TAKE 1 TABLET EVERY DAY 90 tablet 3   lidocaine-prilocaine (EMLA) cream Apply to affected area once 30 g 3   Magnesium 200 MG TABS Take 2 tablets by mouth daily.      ondansetron (ZOFRAN) 8 MG tablet Take 1 tablet (8 mg total) by mouth every 8 (eight) hours as needed for nausea or vomiting. Start on the third day after chemotherapy. 30 tablet 1   pantoprazole (PROTONIX) 40 MG tablet TAKE 1 TABLET BY MOUTH TWICE A DAY 180 tablet 3   prochlorperazine (COMPAZINE) 10 MG tablet Take 1 tablet (10 mg total) by mouth every 6 (six) hours as needed for nausea or vomiting. 30 tablet 1   traMADol (ULTRAM) 50 MG tablet Take 1 tablet (50 mg total) by mouth every 6 (six) hours as needed. 30 tablet 0   VITAMIN D, CHOLECALCIFEROL, PO Take 1 tablet by mouth daily.     No current facility-administered medications for this visit.    SUMMARY OF ONCOLOGIC HISTORY: Oncology History Overview Note  MMR normal MSI stable PD-L1 CPS: 1% Her2 positive P53 + carcinosarcoma   Carcinosarcoma of body of uterus (HCC)  09/23/2021 Initial Diagnosis   She presented with PMB   09/28/2021 Imaging   US pelvis Enlarged uterus with fibroids. Pelvic sonogram is otherwise unremarkable.    11/11/2021 Pathology Results   A. CERVIX, MASS, EXCISION:  - HIGH-GRADE UNDIFFERENTIATED MALIGNANCY WITH EXTENSIVE NECROSIS (SEE COMMENT).   COMMENT:  The tumor consists of mostly high-grade sarcomatoid morphology with focal epithelioid morphology.  Immunohistochemical stains are performed on block A4.  The tumor cells are positive for desmin and p16 while negative for CD45, CK7, CK20, chromogranin, synaptophysin, Melan-A, p40, AE1/AE3, SMA, p63, cyclin D1, S100 and smooth muscle myosin.  Ki-67 proliferation index is high.  CD10 staining shows focal nonspecific staining.  The differential diagnosis includes a high-grade sarcoma and a partially sampled carcinosarcoma.    11/25/2021 Imaging   MRI pelvis Marked distention of the entire endometrial cavity by heterogeneously enhancing soft tissue density, which extends through the endocervical canal into the lower vagina. This is highly suspicious for  endometrial carcinoma.   Diffuse myometrial thinning due to marked distention of endometrial cavity limits evaluation; deep myometrial invasion cannot be excluded in the uterine fundus.   No evidence of extra-uterine extension of tumor.   Lymphadenopathy in lower abdominal retroperitoneum, bilateral iliac chains, and sigmoid mesocolon, consistent with metastatic disease.   Sigmoid diverticulosis. No radiographic evidence of diverticulitis.   11/26/2021 Initial Diagnosis   Uterine cancer (HCC)   11/26/2021 Cancer Staging   Staging form: Corpus Uteri - Carcinoma and Carcinosarcoma, AJCC 8th Edition - Clinical stage from 11/26/2021: FIGO Stage IIIC2 (cT3, cN2, cM0) - Signed by Artis Delay, MD on 11/26/2021 Stage prefix: Initial diagnosis Histologic grade (G): G3 Histologic grading system: 3 grade system   11/30/2021 Pathology Results   FINAL MICROSCOPIC DIAGNOSIS:  A. UTERINE CONTENTS, BIOPSY: - HIGH-GRADE UNDIFFERENTIATED MALIGNANCY WITH EXTENSIVE NECROSIS (SEE COMMENT).  COMMENT: The patient's history of a high-grade undifferentiated malignancy with extensive necrosis of the cervix is noted.  The morphological features of the current tumor cells are similar to the tumor cells seen in the previous cervical mass excision.  Immunohistochemical staining for desmin and cytokeratin AE1/AE3 is performed on block A1.  The tumor cells are diffusely positive for desmin.   AE1/AE3 stain is focally positive, however this is scant and of unclear clinical significance. Given the small quantity of tissue in the current biopsy, a definitive distinction between a high-grade sarcoma and carcinosarcoma cannot be made as this biopsy may not be representative of the entire tumor. If clinically indicated, this case as well as the previous cervical mass excision ([id number]) may be sent out for expert consultation. Clinical correlation recommended.     12/01/2021 Tumor Marker   Patient's tumor was tested  for the following markers: CA-125. Results of the tumor marker test revealed 491.   12/22/2021 Surgery   Robotic-assisted laparoscopic total hysterectomy with bilateral salpingo-oophorectomy, tumor debulking including high common iliac lymph node excision, aborted attempt of bilateral external iliac enlarged lymph nodes, mini-lap for specimen delivery, cystoscopy    12/22/2021 Pathology Results   FINAL MICROSCOPIC DIAGNOSIS:  A. LEFT CERVICAL MARGIN, EXCISION: - Positive for carcinoma  B. HIGH LEFT COMMON ILIAC LYMPH NODE, EXCISION: - Metastatic carcinosarcoma involving the lymph node  C. 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, including undifferentiated sarcoma with rhabdoid features and high-grade serous carcinoma, see comment - Tumor invades for a depth of 1 mm where myometrial thickness is 5 mm - Benign bilateral fallopian tubes and ovaries - See oncology table - See comment  ONCOLOGY TABLE:  UTERUS, CARCINOMA OR CARCINOSARCOMA: Resection  Procedure: Total hysterectomy and bilateral salpingo-oophorectomy Histologic Type: Carcinosarcoma (mixed malignant Mullerian tumor) Histologic Grade: High-grade Myometrial Invasion:      Depth of Myometrial Invasion (mm): 1 mm      Myometrial Thickness (mm): 5 mm      Percentage of Myometrial Invasion: 20% Uterine Serosa Involvement: Not identified Cervical stromal Involvement: Present Extent of involvement of other tissue/organs: Not identified Peritoneal/Ascitic Fluid: Not applicable Lymphovascular Invasion: Present Regional Lymph Nodes:      Pelvic Lymph Nodes Examined:                                  0 Sentinel                                  1 Non-sentinel                                  1 Total      Pelvic Lymph Nodes with Metastasis: 1                          Macrometastasis: (>2.0 mm): 1                          Micrometastasis: (>0.2  mm and < 2.0 mm): 0                          Isolated Tumor Cells (<0.2 mm): 0                          Laterality of Lymph Node with Tumor: Left  Extracapsular Extension: Not identified      Para-aortic Lymph Nodes Examined:                                   0 Sentinel                                   0 non-sentinel                                   0 total Distant Metastasis:      Distant Site(s) Involved: Not applicable Pathologic Stage Classification (pTNM, AJCC 8th Edition): pT1a, pN1 Ancillary Studies: MMR / MSI testing will be ordered Representative Tumor Block: C1 Comment(s): None  ([id number])       01/13/2022 Procedure   Placement of single lumen port a cath via right internal jugular vein. The catheter tip lies at the cavo-atrial junction. A power injectable port a cath was placed and is ready for immediate use.     01/19/2022 Echocardiogram    1. Left ventricular ejection fraction, by estimation, is 60 to 65%. The left ventricle has normal function. The left ventricle has no regional wall motion abnormalities. Left ventricular diastolic parameters are consistent with Grade I diastolic dysfunction (impaired relaxation). The average left ventricular global longitudinal strain is -24.0 %. The global longitudinal strain is normal.  2. Right ventricular systolic function is normal. The right ventricular size is normal. There is normal pulmonary artery systolic pressure.  3. Left atrial size was mildly dilated.  4. The mitral valve is normal in structure. No evidence of mitral valve regurgitation. No evidence of mitral stenosis.  5. The aortic valve was not well visualized. Aortic valve regurgitation is not visualized. Aortic valve sclerosis is present, with no evidence of aortic valve stenosis.  6. The inferior vena cava is normal in size with greater than 50% respiratory variability, suggesting right atrial pressure of 3 mmHg.     01/27/2022 Tumor Marker    Patient's tumor was tested for the following markers: CA-125. Results of the tumor marker test revealed 307.   02/14/2022 -  Chemotherapy   Patient is on Treatment Plan : UTERINE SEROUS CARCINOMA Carboplatin + Paclitaxel + Trastuzumab q21d x 6 Cycles / Trastuzumab q21d     02/15/2022 Tumor Marker   Patient's tumor was tested for the following markers: CA-125. Results of the tumor marker test revealed 161.   03/29/2022 Tumor Marker   Patient's tumor was tested for the following markers: CA-125. Results of the tumor marker test revealed 30.   04/13/2022 Echocardiogram   1. Left ventricular ejection fraction, by estimation, is 60 to 65%. The left ventricle has normal function. The left ventricle has no regional wall motion abnormalities. Left ventricular diastolic parameters are consistent with Grade I diastolic dysfunction (impaired relaxation). The average left ventricular global longitudinal strain is -19.4 %. The global longitudinal strain is normal.  2. Right ventricular systolic function is normal. The right ventricular size is normal. There is normal pulmonary artery systolic pressure. The estimated right ventricular systolic pressure is 21.0 mmHg.  3. Left atrial size was mild to moderately dilated.  4. The mitral valve is grossly normal. Trivial mitral valve regurgitation.  5. The aortic valve is tricuspid. Aortic valve regurgitation is not visualized. Aortic valve  sclerosis is present, with no evidence of aortic valve stenosis.  6. The inferior vena cava is normal in size with greater than 50% respiratory variability, suggesting right atrial pressure of 3 mmHg.     PHYSICAL EXAMINATION: ECOG PERFORMANCE STATUS: 1 - Symptomatic but completely ambulatory  Vitals:   05/30/22 0943  BP: (!) 157/74  Pulse: 85  Resp: 18  Temp: (!) 97.4 F (36.3 C)  SpO2: 100%   Filed Weights   05/30/22 0943  Weight: 146 lb 12.8 oz (66.6 kg)    GENERAL:alert, no distress and comfortable  NEURO:  alert & oriented x 3 with fluent speech, no focal motor/sensory deficits  LABORATORY DATA:  I have reviewed the data as listed    Component Value Date/Time   NA 140 05/09/2022 0939   NA 140 11/02/2016 1130   K 3.8 05/09/2022 0939   K 3.8 11/02/2016 1130   CL 107 05/09/2022 0939   CL 105 05/02/2012 0822   CO2 24 05/09/2022 0939   CO2 26 11/02/2016 1130   GLUCOSE 124 (H) 05/09/2022 0939   GLUCOSE 87 11/02/2016 1130   GLUCOSE 98 05/02/2012 0822   BUN 24 (H) 05/09/2022 0939   BUN 14.7 11/02/2016 1130   CREATININE 1.04 (H) 05/09/2022 0939   CREATININE 0.84 12/05/2018 1014   CREATININE 0.8 11/02/2016 1130   CALCIUM 9.4 05/09/2022 0939   CALCIUM 9.7 11/02/2016 1130   PROT 7.0 05/09/2022 0939   PROT 6.8 11/02/2016 1130   ALBUMIN 3.9 05/09/2022 0939   ALBUMIN 3.6 11/02/2016 1130   AST 24 05/09/2022 0939   AST 23 11/02/2016 1130   ALT 17 05/09/2022 0939   ALT 17 11/02/2016 1130   ALKPHOS 106 05/09/2022 0939   ALKPHOS 59 11/02/2016 1130   BILITOT 0.3 05/09/2022 0939   BILITOT 0.34 11/02/2016 1130   GFRNONAA 56 (L) 05/09/2022 0939   GFRAA >60 11/08/2017 0504   GFRAA >60 07/25/2017 1425    No results found for: "SPEP", "UPEP"  Lab Results  Component Value Date   WBC 3.3 (L) 05/30/2022   NEUTROABS 1.8 05/30/2022   HGB 8.7 (L) 05/30/2022   HCT 26.2 (L) 05/30/2022   MCV 94.9 05/30/2022   PLT 133 (L) 05/30/2022      Chemistry      Component Value Date/Time   NA 140 05/09/2022 0939   NA 140 11/02/2016 1130   K 3.8 05/09/2022 0939   K 3.8 11/02/2016 1130   CL 107 05/09/2022 0939   CL 105 05/02/2012 0822   CO2 24 05/09/2022 0939   CO2 26 11/02/2016 1130   BUN 24 (H) 05/09/2022 0939   BUN 14.7 11/02/2016 1130   CREATININE 1.04 (H) 05/09/2022 0939   CREATININE 0.84 12/05/2018 1014   CREATININE 0.8 11/02/2016 1130      Component Value Date/Time   CALCIUM 9.4 05/09/2022 0939   CALCIUM 9.7 11/02/2016 1130   ALKPHOS 106 05/09/2022 0939   ALKPHOS 59 11/02/2016 1130    AST 24 05/09/2022 0939   AST 23 11/02/2016 1130   ALT 17 05/09/2022 0939   ALT 17 11/02/2016 1130   BILITOT 0.3 05/09/2022 0939   BILITOT 0.34 11/02/2016 1130

## 2022-05-31 ENCOUNTER — Other Ambulatory Visit: Payer: Self-pay

## 2022-05-31 LAB — CA 125: Cancer Antigen (CA) 125: 8 U/mL (ref 0.0–38.1)

## 2022-06-01 ENCOUNTER — Other Ambulatory Visit: Payer: Self-pay

## 2022-06-03 ENCOUNTER — Ambulatory Visit: Payer: Medicare HMO | Admitting: Gynecologic Oncology

## 2022-06-07 ENCOUNTER — Telehealth: Payer: Self-pay | Admitting: Genetic Counselor

## 2022-06-07 ENCOUNTER — Encounter: Payer: Self-pay | Admitting: Genetic Counselor

## 2022-06-07 DIAGNOSIS — Z1379 Encounter for other screening for genetic and chromosomal anomalies: Secondary | ICD-10-CM | POA: Insufficient documentation

## 2022-06-07 NOTE — Telephone Encounter (Signed)
LM on VM that results are back and to please call.  Left CB instructions. 

## 2022-06-08 ENCOUNTER — Encounter: Payer: Self-pay | Admitting: Hematology and Oncology

## 2022-06-08 NOTE — Telephone Encounter (Signed)
Revealed negative genetic testing.  Discussed that we do not know why she has uterine cancer or why there is cancer in the family. It could be due to a different gene that we are not testing, or maybe our current technology may not be able to pick something up.  It will be important for her to keep in contact with genetics to keep up with whether additional testing may be needed.  

## 2022-06-09 ENCOUNTER — Ambulatory Visit: Payer: Self-pay | Admitting: Genetic Counselor

## 2022-06-09 DIAGNOSIS — Z1379 Encounter for other screening for genetic and chromosomal anomalies: Secondary | ICD-10-CM

## 2022-06-09 NOTE — Progress Notes (Signed)
HPI:  Ms. Vescovi was previously seen in the [facility] due to a personal and family history of cancer and concerns regarding a hereditary predisposition to cancer. Please refer to our prior cancer genetics clinic note for more information regarding our discussion, assessment and recommendations, at the time. Ms. Borman recent genetic test results were disclosed to her, as were recommendations warranted by these results. These results and recommendations are discussed in more detail below.  CANCER HISTORY:  Oncology History Overview Note  MMR normal MSI stable PD-L1 CPS: 1% Her2 positive P53 + carcinosarcoma   Carcinosarcoma of body of uterus (HCC)  09/23/2021 Initial Diagnosis   She presented with PMB   09/28/2021 Imaging   US pelvis Enlarged uterus with fibroids. Pelvic sonogram is otherwise unremarkable.    11/11/2021 Pathology Results   A. CERVIX, MASS, EXCISION:  - HIGH-GRADE UNDIFFERENTIATED MALIGNANCY WITH EXTENSIVE NECROSIS (SEE COMMENT).   COMMENT:  The tumor consists of mostly high-grade sarcomatoid morphology with focal epithelioid morphology.  Immunohistochemical stains are performed on block A4.  The tumor cells are positive for desmin and p16 while negative for CD45, CK7, CK20, chromogranin, synaptophysin, Melan-A, p40, AE1/AE3, SMA, p63, cyclin D1, S100 and smooth muscle myosin.  Ki-67 proliferation index is high.  CD10 staining shows focal nonspecific staining.  The differential diagnosis includes a high-grade sarcoma and a partially sampled carcinosarcoma.    11/25/2021 Imaging   MRI pelvis Marked distention of the entire endometrial cavity by heterogeneously enhancing soft tissue density, which extends through the endocervical canal into the lower vagina. This is highly suspicious for endometrial carcinoma.   Diffuse myometrial thinning due to marked distention of endometrial cavity limits evaluation; deep myometrial invasion cannot be excluded in  the uterine fundus.   No evidence of extra-uterine extension of tumor.   Lymphadenopathy in lower abdominal retroperitoneum, bilateral iliac chains, and sigmoid mesocolon, consistent with metastatic disease.   Sigmoid diverticulosis. No radiographic evidence of diverticulitis.   11/26/2021 Initial Diagnosis   Uterine cancer (HCC)   11/26/2021 Cancer Staging   Staging form: Corpus Uteri - Carcinoma and Carcinosarcoma, AJCC 8th Edition - Clinical stage from 11/26/2021: FIGO Stage IIIC2 (cT3, cN2, cM0) - Signed by Artis Delay, MD on 11/26/2021 Stage prefix: Initial diagnosis Histologic grade (G): G3 Histologic grading system: 3 grade system   11/30/2021 Pathology Results   FINAL MICROSCOPIC DIAGNOSIS:  A. UTERINE CONTENTS, BIOPSY: - HIGH-GRADE UNDIFFERENTIATED MALIGNANCY WITH EXTENSIVE NECROSIS (SEE COMMENT).  COMMENT: The patient's history of a high-grade undifferentiated malignancy with extensive necrosis of the cervix is noted.  The morphological features of the current tumor cells are similar to the tumor cells seen in the previous cervical mass excision.  Immunohistochemical staining for desmin and cytokeratin AE1/AE3 is performed on block A1.  The tumor cells are diffusely positive for desmin.   AE1/AE3 stain is focally positive, however this is scant and of unclear clinical significance. Given the small quantity of tissue in the current biopsy, a definitive distinction between a high-grade sarcoma and carcinosarcoma cannot be made as this biopsy may not be representative of the entire tumor. If clinically indicated, this case as well as the previous cervical mass excision ([id number]) may be sent out for expert consultation. Clinical correlation recommended.     12/01/2021 Tumor Marker   Patient's tumor was tested for the following markers: CA-125. Results of the tumor marker test revealed 491.   12/22/2021 Surgery   Robotic-assisted laparoscopic total hysterectomy with  bilateral salpingo-oophorectomy, tumor debulking including high  common iliac lymph node excision, aborted attempt of bilateral external iliac enlarged lymph nodes, mini-lap for specimen delivery, cystoscopy    12/22/2021 Pathology Results   FINAL MICROSCOPIC DIAGNOSIS:  A. LEFT CERVICAL MARGIN, EXCISION: - Positive for carcinoma  B. HIGH LEFT COMMON ILIAC LYMPH NODE, EXCISION: - Metastatic carcinosarcoma involving the lymph node  C. 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, including undifferentiated sarcoma with rhabdoid features and high-grade serous carcinoma, see comment - Tumor invades for a depth of 1 mm where myometrial thickness is 5 mm - Benign bilateral fallopian tubes and ovaries - See oncology table - See comment  ONCOLOGY TABLE:  UTERUS, CARCINOMA OR CARCINOSARCOMA: Resection  Procedure: Total hysterectomy and bilateral salpingo-oophorectomy Histologic Type: Carcinosarcoma (mixed malignant Mullerian tumor) Histologic Grade: High-grade Myometrial Invasion:      Depth of Myometrial Invasion (mm): 1 mm      Myometrial Thickness (mm): 5 mm      Percentage of Myometrial Invasion: 20% Uterine Serosa Involvement: Not identified Cervical stromal Involvement: Present Extent of involvement of other tissue/organs: Not identified Peritoneal/Ascitic Fluid: Not applicable Lymphovascular Invasion: Present Regional Lymph Nodes:      Pelvic Lymph Nodes Examined:                                  0 Sentinel                                  1 Non-sentinel                                  1 Total      Pelvic Lymph Nodes with Metastasis: 1                          Macrometastasis: (>2.0 mm): 1                          Micrometastasis: (>0.2 mm and < 2.0 mm): 0                          Isolated Tumor Cells (<0.2 mm): 0                          Laterality of Lymph Node with Tumor: Left                           Extracapsular Extension: Not identified      Para-aortic Lymph Nodes Examined:                                   0 Sentinel                                   0 non-sentinel                                   0 total Distant  Metastasis:      Distant Site(s) Involved: Not applicable Pathologic Stage Classification (pTNM, AJCC 8th Edition): pT1a, pN1 Ancillary Studies: MMR / MSI testing will be ordered Representative Tumor Block: C1 Comment(s): None  ([id number])       01/13/2022 Procedure   Placement of single lumen port a cath via right internal jugular vein. The catheter tip lies at the cavo-atrial junction. A power injectable port a cath was placed and is ready for immediate use.     01/19/2022 Echocardiogram    1. Left ventricular ejection fraction, by estimation, is 60 to 65%. The left ventricle has normal function. The left ventricle has no regional wall motion abnormalities. Left ventricular diastolic parameters are consistent with Grade I diastolic dysfunction (impaired relaxation). The average left ventricular global longitudinal strain is -24.0 %. The global longitudinal strain is normal.  2. Right ventricular systolic function is normal. The right ventricular size is normal. There is normal pulmonary artery systolic pressure.  3. Left atrial size was mildly dilated.  4. The mitral valve is normal in structure. No evidence of mitral valve regurgitation. No evidence of mitral stenosis.  5. The aortic valve was not well visualized. Aortic valve regurgitation is not visualized. Aortic valve sclerosis is present, with no evidence of aortic valve stenosis.  6. The inferior vena cava is normal in size with greater than 50% respiratory variability, suggesting right atrial pressure of 3 mmHg.     01/27/2022 Tumor Marker   Patient's tumor was tested for the following markers: CA-125. Results of the tumor marker test revealed 307.   02/14/2022 -  Chemotherapy   Patient is on  Treatment Plan : UTERINE SEROUS CARCINOMA Carboplatin + Paclitaxel + Trastuzumab q21d x 6 Cycles / Trastuzumab q21d     02/15/2022 Tumor Marker   Patient's tumor was tested for the following markers: CA-125. Results of the tumor marker test revealed 161.   03/29/2022 Tumor Marker   Patient's tumor was tested for the following markers: CA-125. Results of the tumor marker test revealed 30.   04/13/2022 Echocardiogram   1. Left ventricular ejection fraction, by estimation, is 60 to 65%. The left ventricle has normal function. The left ventricle has no regional wall motion abnormalities. Left ventricular diastolic parameters are consistent with Grade I diastolic dysfunction (impaired relaxation). The average left ventricular global longitudinal strain is -19.4 %. The global longitudinal strain is normal.  2. Right ventricular systolic function is normal. The right ventricular size is normal. There is normal pulmonary artery systolic pressure. The estimated right ventricular systolic pressure is 21.0 mmHg.  3. Left atrial size was mild to moderately dilated.  4. The mitral valve is grossly normal. Trivial mitral valve regurgitation.  5. The aortic valve is tricuspid. Aortic valve regurgitation is not visualized. Aortic valve sclerosis is present, with no evidence of aortic valve stenosis.  6. The inferior vena cava is normal in size with greater than 50% respiratory variability, suggesting right atrial pressure of 3 mmHg.   06/01/2022 Tumor Marker   Patient's tumor was tested for the following markers: CA-125. Results of the tumor marker test revealed 8.   06/05/2022 Genetic Testing   Negative genetic testing on the Multi-Cancer gene panel + RNA.  The report date is Jun 05, 2022.  The Multi-Cancer + RNA Panel offered by [facility] includes sequencing and/or deletion/duplication analysis of the following 70 genes:  AIP*, ALK, APC*, ATM*, AXIN2*, BAP1*, BARD1*, BLM*, BMPR1A*, BRCA1*, BRCA2*, BRIP1*, CDC73*,  CDH1*, CDK4, CDKN1B*, CDKN2A, CHEK2*, CTNNA1*,  DICER1*, EPCAM (del/dup only), EGFR, FH*, FLCN*, GREM1 (promoter dup only), HOXB13, KIT, LZTR1, MAX*, MBD4, MEN1*, MET, MITF, MLH1*, MSH2*, MSH3*, MSH6*, MUTYH*, NF1*, NF2*, NTHL1*, PALB2*, PDGFRA, PMS2*, POLD1*, POLE*, POT1*, PRKAR1A*, PTCH1*, PTEN*, RAD51C*, RAD51D*, RB1*, RET, SDHA* (sequencing only), SDHAF2*, SDHB*, SDHC*, SDHD*, SMAD4*, SMARCA4*, SMARCB1*, SMARCE1*, STK11*, SUFU*, TMEM127*, TP53*, TSC1*, TSC2*, VHL*. RNA analysis is performed for * genes.      FAMILY HISTORY:  We obtained a detailed, 4-generation family history.  Significant diagnoses are listed below: Family History  Problem Relation Age of Onset   Alcohol abuse Mother    Colon polyps Mother    Mental illness Mother    Alcohol abuse Father        suicide   Mental illness Father    Cervical cancer Sister 45   Stomach cancer Paternal Grandmother    Heart disease Paternal Grandfather    Colon cancer Neg Hx    Esophageal cancer Neg Hx    Rectal cancer Neg Hx    Breast cancer Neg Hx    Ovarian cancer Neg Hx    Endometrial cancer Neg Hx    Pancreatic cancer Neg Hx    Prostate cancer Neg Hx        The patient has two daughters who are cancer free.  She has two sisters and a brother, one sister had cervical cancer at 67.  Both parents are deceased.   The patient's mother had 5 siblings who were reported to be cancer free.  The maternal grandparents died of non-cancer related issues.   The patient's father died of suicide.  He had a sister who was cancer free.  His mother and maternal uncle both had stomach cancer.  The grandfather died of heart disease.   Ms. Medendorp is unaware of previous family history of genetic testing for hereditary cancer risks. Patient's maternal ancestors are of [place] descent, and paternal ancestors are of [place] and [place] descent. There is no reported Ashkenazi Jewish ancestry. There is no known consanguinity  GENETIC TEST RESULTS: Genetic  testing reported out on Jun 05, 2022 through the Multi-cancer + RNA cancer panel found no pathogenic mutations. The Multi-Cancer + RNA Panel offered by [facility] includes sequencing and/or deletion/duplication analysis of the following 70 genes:  AIP*, ALK, APC*, ATM*, AXIN2*, BAP1*, BARD1*, BLM*, BMPR1A*, BRCA1*, BRCA2*, BRIP1*, CDC73*, CDH1*, CDK4, CDKN1B*, CDKN2A, CHEK2*, CTNNA1*, DICER1*, EPCAM (del/dup only), EGFR, FH*, FLCN*, GREM1 (promoter dup only), HOXB13, KIT, LZTR1, MAX*, MBD4, MEN1*, MET, MITF, MLH1*, MSH2*, MSH3*, MSH6*, MUTYH*, NF1*, NF2*, NTHL1*, PALB2*, PDGFRA, PMS2*, POLD1*, POLE*, POT1*, PRKAR1A*, PTCH1*, PTEN*, RAD51C*, RAD51D*, RB1*, RET, SDHA* (sequencing only), SDHAF2*, SDHB*, SDHC*, SDHD*, SMAD4*, SMARCA4*, SMARCB1*, SMARCE1*, STK11*, SUFU*, TMEM127*, TP53*, TSC1*, TSC2*, VHL*. RNA analysis is performed for * genes. The test report has been scanned into EPIC and is located under the Molecular Pathology section of the Results Review tab.  A portion of the result report is included below for reference.     We discussed with Ms. Reust that because current genetic testing is not perfect, it is possible there may be a gene mutation in one of these genes that current testing cannot detect, but that chance is small.  We also discussed, that there could be another gene that has not yet been discovered, or that we have not yet tested, that is responsible for the cancer diagnoses in the family. It is also possible there is a hereditary cause for the cancer in the family that Ms. Faraci did not inherit  and therefore was not identified in her testing.  Therefore, it is important to remain in touch with cancer genetics in the future so that we can continue to offer Ms. Kuwahara the most up to date genetic testing.   ADDITIONAL GENETIC TESTING: We discussed with Ms. Coppes that her genetic testing was fairly extensive.  If there are genes identified to increase cancer risk that can be analyzed in the  future, we would be happy to discuss and coordinate this testing at that time.    CANCER SCREENING RECOMMENDATIONS: Ms. Deeb test result is considered negative (normal).  This means that we have not identified a hereditary cause for her personal and family history of cancer at this time. Most cancers happen by chance and this negative test suggests that her cancer may fall into this category.    Possible reasons for Ms. Pereda's negative genetic test include:  1. There may be a gene mutation in one of these genes that current testing methods cannot detect but that chance is small.  2. There could be another gene that has not yet been discovered, or that we have not yet tested, that is responsible for the cancer diagnoses in the family.  3.  There may be no hereditary risk for cancer in the family. The cancers in Ms. Zimny and/or her family may be sporadic/familial or due to other genetic and environmental factors. 4. It is also possible there is a hereditary cause for the cancer in the family that Ms. Ginsberg did not inherit.  Therefore, it is recommended she continue to follow the cancer management and screening guidelines provided by her oncology and primary healthcare provider. An individual's cancer risk and medical management are not determined by genetic test results alone. Overall cancer risk assessment incorporates additional factors, including personal medical history, family history, and any available genetic information that may result in a personalized plan for cancer prevention and surveillance   RECOMMENDATIONS FOR FAMILY MEMBERS:  Individuals in this family might be at some increased risk of developing cancer, over the general population risk, simply due to the family history of cancer.  We recommended women in this family have a yearly mammogram beginning at age 92, or 57 years younger than the earliest onset of cancer, an annual clinical breast exam, and perform monthly breast  self-exams. Women in this family should also have a gynecological exam as recommended by their primary provider. All family members should be referred for colonoscopy starting at age 57.  FOLLOW-UP: Lastly, we discussed with Ms. Gowen that cancer genetics is a rapidly advancing field and it is possible that new genetic tests will be appropriate for her and/or her family members in the future. We encouraged her to remain in contact with cancer genetics on an annual basis so we can update her personal and family histories and let her know of advances in cancer genetics that may benefit this family.   Our contact number was provided. Ms. Ladage questions were answered to her satisfaction, and she knows she is welcome to call us at anytime with additional questions or concerns.   Maylon Cos, MS, [facility] Licensed, Certified Genetic Counselor [email or web address]

## 2022-06-13 ENCOUNTER — Encounter (HOSPITAL_COMMUNITY): Payer: Self-pay

## 2022-06-13 ENCOUNTER — Ambulatory Visit (HOSPITAL_COMMUNITY)
Admission: RE | Admit: 2022-06-13 | Discharge: 2022-06-13 | Disposition: A | Discharge status: Home / Self Care | Payer: Medicare HMO | Source: Ambulatory Visit | Attending: Hematology and Oncology | Admitting: Hematology and Oncology

## 2022-06-13 DIAGNOSIS — C549 Malignant neoplasm of corpus uteri, unspecified: Secondary | ICD-10-CM

## 2022-06-13 DIAGNOSIS — N183 Chronic kidney disease, stage 3 unspecified: Secondary | ICD-10-CM | POA: Diagnosis not present

## 2022-06-13 DIAGNOSIS — C569 Malignant neoplasm of unspecified ovary: Secondary | ICD-10-CM | POA: Diagnosis not present

## 2022-06-13 DIAGNOSIS — K6389 Other specified diseases of intestine: Secondary | ICD-10-CM | POA: Diagnosis not present

## 2022-06-13 MED ORDER — HEPARIN SOD (PORK) LOCK FLUSH 100 UNIT/ML IV SOLN
INTRAVENOUS | Status: AC
  Filled 2022-06-13: qty 5

## 2022-06-13 MED ORDER — IOHEXOL 300 MG/ML  SOLN
100.0000 mL | Freq: Once | INTRAMUSCULAR | Status: AC | PRN
  Administered 2022-06-13: 100 mL via INTRAVENOUS

## 2022-06-13 MED ORDER — SODIUM CHLORIDE (PF) 0.9 % IJ SOLN
INTRAMUSCULAR | Status: AC
  Filled 2022-06-13: qty 50

## 2022-06-13 MED ORDER — HEPARIN SOD (PORK) LOCK FLUSH 100 UNIT/ML IV SOLN
500.0000 [IU] | Freq: Once | INTRAVENOUS | Status: AC
  Administered 2022-06-13: 500 [IU] via INTRAVENOUS

## 2022-06-20 ENCOUNTER — Inpatient Hospital Stay: Payer: Medicare HMO

## 2022-06-20 ENCOUNTER — Encounter: Payer: Self-pay | Admitting: Oncology

## 2022-06-20 ENCOUNTER — Inpatient Hospital Stay: Payer: Medicare HMO | Attending: Hematology and Oncology | Admitting: Hematology and Oncology

## 2022-06-20 ENCOUNTER — Encounter: Payer: Self-pay | Admitting: Hematology and Oncology

## 2022-06-20 ENCOUNTER — Other Ambulatory Visit: Payer: Self-pay

## 2022-06-20 VITALS — BP 141/54 | HR 81 | Temp 98.5°F | Resp 18 | Ht 59.5 in | Wt 146.0 lb

## 2022-06-20 DIAGNOSIS — C549 Malignant neoplasm of corpus uteri, unspecified: Secondary | ICD-10-CM | POA: Insufficient documentation

## 2022-06-20 DIAGNOSIS — N183 Chronic kidney disease, stage 3 unspecified: Secondary | ICD-10-CM | POA: Diagnosis not present

## 2022-06-20 DIAGNOSIS — D61818 Other pancytopenia: Secondary | ICD-10-CM | POA: Insufficient documentation

## 2022-06-20 DIAGNOSIS — Z5112 Encounter for antineoplastic immunotherapy: Secondary | ICD-10-CM | POA: Insufficient documentation

## 2022-06-20 DIAGNOSIS — Z9011 Acquired absence of right breast and nipple: Secondary | ICD-10-CM | POA: Insufficient documentation

## 2022-06-20 DIAGNOSIS — Z90710 Acquired absence of both cervix and uterus: Secondary | ICD-10-CM | POA: Diagnosis not present

## 2022-06-20 LAB — COMPREHENSIVE METABOLIC PANEL
ALT: 18 U/L (ref 0–44)
AST: 23 U/L (ref 15–41)
Albumin: 3.9 g/dL (ref 3.5–5.0)
Alkaline Phosphatase: 95 U/L (ref 38–126)
Anion gap: 9 (ref 5–15)
BUN: 22 mg/dL (ref 8–23)
CO2: 24 mmol/L (ref 22–32)
Calcium: 9.2 mg/dL (ref 8.9–10.3)
Chloride: 108 mmol/L (ref 98–111)
Creatinine, Ser: 1.14 mg/dL — ABNORMAL HIGH (ref 0.44–1.00)
GFR, Estimated: 50 mL/min — ABNORMAL LOW (ref >60)
Glucose, Bld: 100 mg/dL — ABNORMAL HIGH (ref 70–99)
Potassium: 3.5 mmol/L (ref 3.5–5.1)
Sodium: 141 mmol/L (ref 135–145)
Total Bilirubin: 0.3 mg/dL (ref 0.3–1.2)
Total Protein: 7 g/dL (ref 6.5–8.1)

## 2022-06-20 LAB — CBC WITH DIFFERENTIAL/PLATELET
Abs Immature Granulocytes: 0 10*3/uL (ref 0.00–0.07)
Basophils Absolute: 0 10*3/uL (ref 0.0–0.1)
Basophils Relative: 0 %
Eosinophils Absolute: 0 10*3/uL (ref 0.0–0.5)
Eosinophils Relative: 1 %
HCT: 25 % — ABNORMAL LOW (ref 36.0–46.0)
Hemoglobin: 8.2 g/dL — ABNORMAL LOW (ref 12.0–15.0)
Immature Granulocytes: 0 %
Lymphocytes Relative: 32 %
Lymphs Abs: 0.9 10*3/uL (ref 0.7–4.0)
MCH: 33.1 pg (ref 26.0–34.0)
MCHC: 32.8 g/dL (ref 30.0–36.0)
MCV: 100.8 fL — ABNORMAL HIGH (ref 80.0–100.0)
Monocytes Absolute: 0.4 10*3/uL (ref 0.1–1.0)
Monocytes Relative: 14 %
Neutro Abs: 1.4 10*3/uL — ABNORMAL LOW (ref 1.7–7.7)
Neutrophils Relative %: 53 %
Platelets: 113 10*3/uL — ABNORMAL LOW (ref 150–400)
RBC: 2.48 MIL/uL — ABNORMAL LOW (ref 3.87–5.11)
RDW: 18.6 % — ABNORMAL HIGH (ref 11.5–15.5)
WBC: 2.6 10*3/uL — ABNORMAL LOW (ref 4.0–10.5)
nRBC: 0 % (ref 0.0–0.2)

## 2022-06-20 MED ORDER — TRASTUZUMAB-ANNS CHEMO 150 MG IV SOLR
6.0000 mg/kg | Freq: Once | INTRAVENOUS | Status: AC
  Administered 2022-06-20: 420 mg via INTRAVENOUS
  Filled 2022-06-20: qty 20

## 2022-06-20 MED ORDER — HEPARIN SOD (PORK) LOCK FLUSH 100 UNIT/ML IV SOLN
500.0000 [IU] | Freq: Once | INTRAVENOUS | Status: AC | PRN
  Administered 2022-06-20: 500 [IU]

## 2022-06-20 MED ORDER — SODIUM CHLORIDE 0.9 % IV SOLN: Freq: Once | INTRAVENOUS | Status: AC

## 2022-06-20 MED ORDER — SODIUM CHLORIDE 0.9% FLUSH
10.0000 mL | INTRAVENOUS | Status: DC | PRN
  Administered 2022-06-20: 10 mL

## 2022-06-20 NOTE — Progress Notes (Signed)
Requested ER testing on accession [id number] with [facility] via email.

## 2022-06-20 NOTE — Assessment & Plan Note (Signed)
I have reviewed multiple CT imaging I reviewed her surgical report She has residual lymphadenopathy.  On review of her surgical note, it appears that complete resection was not possible so I suspect the lymphadenopathy are persistent due to incomplete surgery The patient has been treated with multiple rounds of chemotherapy She is switched to maintenance Herceptin only I will get additional test to be added to her surgical specimen Specifically, I would like to know if she had estrogen receptor positive disease.  If test is positive, I could potentially add antiestrogen therapy

## 2022-06-20 NOTE — Progress Notes (Signed)
[facility] OFFICE PROGRESS NOTE  Patient Care Team: Carver Fila, MD as PCP - General (Gynecologic Oncology) Hannah Beat, MD as Consulting Physician [facility] Medicine)  ASSESSMENT & PLAN:  Carcinosarcoma of body of uterus [facility]) I have reviewed multiple CT imaging I reviewed her surgical report She has residual lymphadenopathy.  On review of her surgical note, it appears that complete resection was not possible so I suspect the lymphadenopathy are persistent due to incomplete surgery The patient has been treated with multiple rounds of chemotherapy She is switched to maintenance Herceptin only I will get additional test to be added to her surgical specimen Specifically, I would like to know if she had estrogen receptor positive disease.  If test is positive, I could potentially add antiestrogen therapy  Pancytopenia, acquired (HCC) She has slight worsening pancytopenia due to treatment She is not symptomatic We will proceed with treatment without delay Or anticipate recovery once we stop her chemotherapy  CKD (chronic kidney disease), stage III (HCC) She has history of intermittent kidney failure We will monitor that closely  No orders of the defined types were placed in this encounter.   All questions were answered. The patient knows to call the clinic with any problems, questions or concerns. The total time spent in the appointment was 30 minutes encounter with patients including review of chart and various tests results, discussions about plan of care and coordination of care plan   Artis Delay, MD 06/20/2022 1:59 PM  INTERVAL HISTORY: Please see below for problem oriented charting. she returns for treatment follow-up We spent majority of our time reviewing test results and surgical report She is not symptomatic She tolerated last cycle of chemotherapy well After extensive review of her surgical report and recent imaging study, I suspect her disease  is stable She will continue maintenance treatment with trastuzumab  REVIEW OF SYSTEMS:   Constitutional: Denies fevers, chills or abnormal weight loss Eyes: Denies blurriness of vision Ears, nose, mouth, throat, and face: Denies mucositis or sore throat Respiratory: Denies cough, dyspnea or wheezes Cardiovascular: Denies palpitation, chest discomfort or lower extremity swelling Gastrointestinal:  Denies nausea, heartburn or change in bowel habits Skin: Denies abnormal skin rashes Lymphatics: Denies new lymphadenopathy or easy bruising Neurological:Denies numbness, tingling or new weaknesses Behavioral/Psych: Mood is stable, no new changes  All other systems were reviewed with the patient and are negative.  I have reviewed the past medical history, past surgical history, social history and family history with the patient and they are unchanged from previous note.  ALLERGIES:  is allergic to tylenol [acetaminophen] and latex.  MEDICATIONS:  Current Outpatient Medications  Medication Sig Dispense Refill   Cyanocobalamin (VITAMIN B 12 PO) Take 1 tablet by mouth.     escitalopram (LEXAPRO) 10 MG tablet TAKE 1 TABLET EVERY DAY 90 tablet 3   lidocaine-prilocaine (EMLA) cream Apply to affected area once 30 g 3   Magnesium 200 MG TABS Take 2 tablets by mouth daily.     ondansetron (ZOFRAN) 8 MG tablet Take 1 tablet (8 mg total) by mouth every 8 (eight) hours as needed for nausea or vomiting. Start on the third day after chemotherapy. 30 tablet 1   pantoprazole (PROTONIX) 40 MG tablet TAKE 1 TABLET BY MOUTH TWICE A DAY 180 tablet 3   prochlorperazine (COMPAZINE) 10 MG tablet Take 1 tablet (10 mg total) by mouth every 6 (six) hours as needed for nausea or vomiting. 30 tablet 1   VITAMIN D, CHOLECALCIFEROL, PO  Take 1 tablet by mouth daily.     No current facility-administered medications for this visit.   Facility-Administered Medications Ordered in Other Visits  Medication Dose Route  Frequency Provider Last Rate Last Admin   heparin lock flush 100 unit/mL  500 Units Intracatheter Once PRN Bertis Ruddy, Blaize Nipper, MD       sodium chloride flush (NS) 0.9 % injection 10 mL  10 mL Intracatheter PRN Bertis Ruddy, Shanyia Stines, MD       trastuzumab-anns (KANJINTI) 420 mg in sodium chloride 0.9 % 250 mL chemo infusion  6 mg/kg (Treatment Plan Recorded) Intravenous Once Artis Delay, MD        SUMMARY OF ONCOLOGIC HISTORY: Oncology History Overview Note  MMR normal MSI stable PD-L1 CPS: 1% Her2 positive P53 + Carcinosarcoma Neg genetics   Carcinosarcoma of body of uterus (HCC)  09/23/2021 Initial Diagnosis   She presented with PMB   09/28/2021 Imaging   US pelvis Enlarged uterus with fibroids. Pelvic sonogram is otherwise unremarkable.    11/11/2021 Pathology Results   A. CERVIX, MASS, EXCISION:  - HIGH-GRADE UNDIFFERENTIATED MALIGNANCY WITH EXTENSIVE NECROSIS (SEE COMMENT).   COMMENT:  The tumor consists of mostly high-grade sarcomatoid morphology with focal epithelioid morphology.  Immunohistochemical stains are performed on block A4.  The tumor cells are positive for desmin and p16 while negative for CD45, CK7, CK20, chromogranin, synaptophysin, Melan-A, p40, AE1/AE3, SMA, p63, cyclin D1, S100 and smooth muscle myosin.  Ki-67 proliferation index is high.  CD10 staining shows focal nonspecific staining.  The differential diagnosis includes a high-grade sarcoma and a partially sampled carcinosarcoma.    11/25/2021 Imaging   MRI pelvis Marked distention of the entire endometrial cavity by heterogeneously enhancing soft tissue density, which extends through the endocervical canal into the lower vagina. This is highly suspicious for endometrial carcinoma.   Diffuse myometrial thinning due to marked distention of endometrial cavity limits evaluation; deep myometrial invasion cannot be excluded in the uterine fundus.   No evidence of extra-uterine extension of tumor.   Lymphadenopathy in lower  abdominal retroperitoneum, bilateral iliac chains, and sigmoid mesocolon, consistent with metastatic disease.   Sigmoid diverticulosis. No radiographic evidence of diverticulitis.   11/26/2021 Initial Diagnosis   Uterine cancer (HCC)   11/26/2021 Cancer Staging   Staging form: Corpus Uteri - Carcinoma and Carcinosarcoma, AJCC 8th Edition - Clinical stage from 11/26/2021: FIGO Stage IIIC2 (cT3, cN2, cM0) - Signed by Artis Delay, MD on 11/26/2021 Stage prefix: Initial diagnosis Histologic grade (G): G3 Histologic grading system: 3 grade system   11/30/2021 Pathology Results   FINAL MICROSCOPIC DIAGNOSIS:  A. UTERINE CONTENTS, BIOPSY: - HIGH-GRADE UNDIFFERENTIATED MALIGNANCY WITH EXTENSIVE NECROSIS (SEE COMMENT).  COMMENT: The patient's history of a high-grade undifferentiated malignancy with extensive necrosis of the cervix is noted.  The morphological features of the current tumor cells are similar to the tumor cells seen in the previous cervical mass excision.  Immunohistochemical staining for desmin and cytokeratin AE1/AE3 is performed on block A1.  The tumor cells are diffusely positive for desmin.   AE1/AE3 stain is focally positive, however this is scant and of unclear clinical significance. Given the small quantity of tissue in the current biopsy, a definitive distinction between a high-grade sarcoma and carcinosarcoma cannot be made as this biopsy may not be representative of the entire tumor. If clinically indicated, this case as well as the previous cervical mass excision ([id number]) may be sent out for expert consultation. Clinical correlation recommended.     12/01/2021 Tumor Marker  Patient's tumor was tested for the following markers: CA-125. Results of the tumor marker test revealed 491.   12/22/2021 Surgery   Robotic-assisted laparoscopic total hysterectomy with bilateral salpingo-oophorectomy, tumor debulking including high common iliac lymph node excision, aborted  attempt of bilateral external iliac enlarged lymph nodes, mini-lap for specimen delivery, cystoscopy    12/22/2021 Pathology Results   FINAL MICROSCOPIC DIAGNOSIS:  A. LEFT CERVICAL MARGIN, EXCISION: - Positive for carcinoma  B. HIGH LEFT COMMON ILIAC LYMPH NODE, EXCISION: - Metastatic carcinosarcoma involving the lymph node  C. 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, including undifferentiated sarcoma with rhabdoid features and high-grade serous carcinoma, see comment - Tumor invades for a depth of 1 mm where myometrial thickness is 5 mm - Benign bilateral fallopian tubes and ovaries - See oncology table - See comment  ONCOLOGY TABLE:  UTERUS, CARCINOMA OR CARCINOSARCOMA: Resection  Procedure: Total hysterectomy and bilateral salpingo-oophorectomy Histologic Type: Carcinosarcoma (mixed malignant Mullerian tumor) Histologic Grade: High-grade Myometrial Invasion:      Depth of Myometrial Invasion (mm): 1 mm      Myometrial Thickness (mm): 5 mm      Percentage of Myometrial Invasion: 20% Uterine Serosa Involvement: Not identified Cervical stromal Involvement: Present Extent of involvement of other tissue/organs: Not identified Peritoneal/Ascitic Fluid: Not applicable Lymphovascular Invasion: Present Regional Lymph Nodes:      Pelvic Lymph Nodes Examined:                                  0 Sentinel                                  1 Non-sentinel                                  1 Total      Pelvic Lymph Nodes with Metastasis: 1                          Macrometastasis: (>2.0 mm): 1                          Micrometastasis: (>0.2 mm and < 2.0 mm): 0                          Isolated Tumor Cells (<0.2 mm): 0                          Laterality of Lymph Node with Tumor: Left                          Extracapsular Extension: Not identified      Para-aortic Lymph Nodes Examined:                                    0 Sentinel                                   0 non-sentinel  0 total Distant Metastasis:      Distant Site(s) Involved: Not applicable Pathologic Stage Classification (pTNM, AJCC 8th Edition): pT1a, pN1 Ancillary Studies: MMR / MSI testing will be ordered Representative Tumor Block: C1 Comment(s): None  ([id number])       01/13/2022 Procedure   Placement of single lumen port a cath via right internal jugular vein. The catheter tip lies at the cavo-atrial junction. A power injectable port a cath was placed and is ready for immediate use.     01/19/2022 Echocardiogram    1. Left ventricular ejection fraction, by estimation, is 60 to 65%. The left ventricle has normal function. The left ventricle has no regional wall motion abnormalities. Left ventricular diastolic parameters are consistent with Grade I diastolic dysfunction (impaired relaxation). The average left ventricular global longitudinal strain is -24.0 %. The global longitudinal strain is normal.  2. Right ventricular systolic function is normal. The right ventricular size is normal. There is normal pulmonary artery systolic pressure.  3. Left atrial size was mildly dilated.  4. The mitral valve is normal in structure. No evidence of mitral valve regurgitation. No evidence of mitral stenosis.  5. The aortic valve was not well visualized. Aortic valve regurgitation is not visualized. Aortic valve sclerosis is present, with no evidence of aortic valve stenosis.  6. The inferior vena cava is normal in size with greater than 50% respiratory variability, suggesting right atrial pressure of 3 mmHg.     01/27/2022 Tumor Marker   Patient's tumor was tested for the following markers: CA-125. Results of the tumor marker test revealed 307.   02/14/2022 -  Chemotherapy   Patient is on Treatment Plan : UTERINE SEROUS CARCINOMA Carboplatin + Paclitaxel + Trastuzumab q21d x 6 Cycles / Trastuzumab  q21d     02/15/2022 Tumor Marker   Patient's tumor was tested for the following markers: CA-125. Results of the tumor marker test revealed 161.   03/29/2022 Tumor Marker   Patient's tumor was tested for the following markers: CA-125. Results of the tumor marker test revealed 30.   04/13/2022 Echocardiogram   1. Left ventricular ejection fraction, by estimation, is 60 to 65%. The left ventricle has normal function. The left ventricle has no regional wall motion abnormalities. Left ventricular diastolic parameters are consistent with Grade I diastolic dysfunction (impaired relaxation). The average left ventricular global longitudinal strain is -19.4 %. The global longitudinal strain is normal.  2. Right ventricular systolic function is normal. The right ventricular size is normal. There is normal pulmonary artery systolic pressure. The estimated right ventricular systolic pressure is 21.0 mmHg.  3. Left atrial size was mild to moderately dilated.  4. The mitral valve is grossly normal. Trivial mitral valve regurgitation.  5. The aortic valve is tricuspid. Aortic valve regurgitation is not visualized. Aortic valve sclerosis is present, with no evidence of aortic valve stenosis.  6. The inferior vena cava is normal in size with greater than 50% respiratory variability, suggesting right atrial pressure of 3 mmHg.   06/01/2022 Tumor Marker   Patient's tumor was tested for the following markers: CA-125. Results of the tumor marker test revealed 8.   06/05/2022 Genetic Testing   Negative genetic testing on the Multi-Cancer gene panel + RNA.  The report date is Jun 05, 2022.  The Multi-Cancer + RNA Panel offered by [facility] includes sequencing and/or deletion/duplication analysis of the following 70 genes:  AIP*, ALK, APC*, ATM*, AXIN2*, BAP1*, BARD1*, BLM*, BMPR1A*, BRCA1*, BRCA2*, BRIP1*, CDC73*, CDH1*, CDK4, CDKN1B*, CDKN2A,  CHEK2*, CTNNA1*, DICER1*, EPCAM (del/dup only), EGFR, FH*, FLCN*, GREM1 (promoter  dup only), HOXB13, KIT, LZTR1, MAX*, MBD4, MEN1*, MET, MITF, MLH1*, MSH2*, MSH3*, MSH6*, MUTYH*, NF1*, NF2*, NTHL1*, PALB2*, PDGFRA, PMS2*, POLD1*, POLE*, POT1*, PRKAR1A*, PTCH1*, PTEN*, RAD51C*, RAD51D*, RB1*, RET, SDHA* (sequencing only), SDHAF2*, SDHB*, SDHC*, SDHD*, SMAD4*, SMARCA4*, SMARCB1*, SMARCE1*, STK11*, SUFU*, TMEM127*, TP53*, TSC1*, TSC2*, VHL*. RNA analysis is performed for * genes.    06/20/2022 Imaging   1. Enlarged periaortic and iliac lymph nodes consistent metastatic disease. Bulky iliac node on the LEFT. 2. No evidence of peritoneal metastasis. 3. Post hysterectomy and oophorectomy.     PHYSICAL EXAMINATION: ECOG PERFORMANCE STATUS: 1 - Symptomatic but completely ambulatory  Vitals:   06/20/22 1243  BP: (!) 141/54  Pulse: 81  Resp: 18  Temp: 98.5 F (36.9 C)  SpO2: 100%   Filed Weights   06/20/22 1243  Weight: 146 lb (66.2 kg)    GENERAL:alert, no distress and comfortable NEURO: alert & oriented x 3 with fluent speech, no focal motor/sensory deficits  LABORATORY DATA:  I have reviewed the data as listed    Component Value Date/Time   NA 140 05/30/2022 0916   NA 140 11/02/2016 1130   K 3.6 05/30/2022 0916   K 3.8 11/02/2016 1130   CL 107 05/30/2022 0916   CL 105 05/02/2012 0822   CO2 25 05/30/2022 0916   CO2 26 11/02/2016 1130   GLUCOSE 99 05/30/2022 0916   GLUCOSE 87 11/02/2016 1130   GLUCOSE 98 05/02/2012 0822   BUN 23 05/30/2022 0916   BUN 14.7 11/02/2016 1130   CREATININE 1.05 (H) 05/30/2022 0916   CREATININE 0.84 12/05/2018 1014   CREATININE 0.8 11/02/2016 1130   CALCIUM 8.8 (L) 05/30/2022 0916   CALCIUM 9.7 11/02/2016 1130   PROT 6.7 05/30/2022 0916   PROT 6.8 11/02/2016 1130   ALBUMIN 3.8 05/30/2022 0916   ALBUMIN 3.6 11/02/2016 1130   AST 21 05/30/2022 0916   AST 23 11/02/2016 1130   ALT 15 05/30/2022 0916   ALT 17 11/02/2016 1130   ALKPHOS 96 05/30/2022 0916   ALKPHOS 59 11/02/2016 1130   BILITOT 0.3 05/30/2022 0916   BILITOT  0.34 11/02/2016 1130   GFRNONAA 55 (L) 05/30/2022 0916   GFRAA >60 11/08/2017 0504   GFRAA >60 07/25/2017 1425    No results found for: "SPEP", "UPEP"  Lab Results  Component Value Date   WBC 2.6 (L) 06/20/2022   NEUTROABS 1.4 (L) 06/20/2022   HGB 8.2 (L) 06/20/2022   HCT 25.0 (L) 06/20/2022   MCV 100.8 (H) 06/20/2022   PLT 113 (L) 06/20/2022      Chemistry      Component Value Date/Time   NA 140 05/30/2022 0916   NA 140 11/02/2016 1130   K 3.6 05/30/2022 0916   K 3.8 11/02/2016 1130   CL 107 05/30/2022 0916   CL 105 05/02/2012 0822   CO2 25 05/30/2022 0916   CO2 26 11/02/2016 1130   BUN 23 05/30/2022 0916   BUN 14.7 11/02/2016 1130   CREATININE 1.05 (H) 05/30/2022 0916   CREATININE 0.84 12/05/2018 1014   CREATININE 0.8 11/02/2016 1130      Component Value Date/Time   CALCIUM 8.8 (L) 05/30/2022 0916   CALCIUM 9.7 11/02/2016 1130   ALKPHOS 96 05/30/2022 0916   ALKPHOS 59 11/02/2016 1130   AST 21 05/30/2022 0916   AST 23 11/02/2016 1130   ALT 15 05/30/2022 0916   ALT 17 11/02/2016 1130  BILITOT 0.3 05/30/2022 0916   BILITOT 0.34 11/02/2016 1130       RADIOGRAPHIC STUDIES: I have reviewed multiple imaging studies with the patient extensively I have personally reviewed the radiological images as listed and agreed with the findings in the report. CT ABDOMEN PELVIS W CONTRAST  Result Date: 06/17/2022 CLINICAL DATA:  "Hx of Uterine ca dx'd 2023, chemo compete, immunotherapy in progress, hx of rt.breast ca, rt.mastectomy, TAH, pt.denies any abd/pelvic complaintsEndometrial cancer, staging, high grade tumor assessment uterine sarcoma s/p surgery and chemo EXAM: CT ABDOMEN AND PELVIS WITH CONTRAST TECHNIQUE: Multidetector CT imaging of the abdomen and pelvis was performed using the standard protocol following bolus administration of intravenous contrast. RADIATION DOSE REDUCTION: This exam was performed according to the departmental dose-optimization program which includes  automated exposure control, adjustment of the mA and/or kV according to patient size and/or use of iterative reconstruction technique. CONTRAST:  OMNIPAQUE IOHEXOL 300 MG/ML  SOLN COMPARISON:  CT 12/07/2021 FINDINGS: Lower chest: Lung bases are clear. Hepatobiliary: No focal hepatic lesion. Normal gallbladder. No biliary duct dilatation. Common bile duct is normal. Pancreas: Pancreas is normal. No ductal dilatation. No pancreatic inflammation. Spleen: Normal spleen Adrenals/urinary tract: Adrenal glands and kidneys are normal. The ureters and bladder normal. Stomach/Bowel: The stomach, duodenum, and small bowel normal. Multiple diverticula of the descending colon and sigmoid colon without acute inflammation. Vascular/Lymphatic: Abdominal aorta is normal caliber. Retroperitoneal lymph node just LEFT of the aortic bifurcation measures 13 mm (image 38/2). LEFT retroperitoneal node at the level of the LEFT renal vein measures 12 mm (image 27). RIGHT common iliac node measures 16 mm on image 49. Reproductive: Post hysterectomy. Surgical history of bilateral oophorectomy. Enlarged LEFT iliac lymph node measuring 2.9 by 2.2 cm (image 59/2.). RIGHT iliac lymph node 13 mm x 16 mm on image 62/2. Other: no peritoneal metastasis. No omental metastasis. No free fluid. Musculoskeletal: No aggressive osseous lesion. IMPRESSION: 1. Enlarged periaortic and iliac lymph nodes consistent metastatic disease. Bulky iliac node on the LEFT. 2. No evidence of peritoneal metastasis. 3. Post hysterectomy and oophorectomy. . Electronically Signed   By: Genevive Bi M.D.   On: 06/17/2022 16:15

## 2022-06-20 NOTE — Assessment & Plan Note (Signed)
She has history of intermittent kidney failure We will monitor that closely

## 2022-06-20 NOTE — Assessment & Plan Note (Signed)
She has slight worsening pancytopenia due to treatment She is not symptomatic We will proceed with treatment without delay Or anticipate recovery once we stop her chemotherapy

## 2022-06-22 LAB — SURGICAL PATHOLOGY

## 2022-06-24 ENCOUNTER — Telehealth: Payer: Self-pay

## 2022-06-24 ENCOUNTER — Encounter: Payer: Self-pay | Admitting: Hematology and Oncology

## 2022-06-24 NOTE — Telephone Encounter (Signed)
Called and left a message asking her to call the office back about mychart message. Offering appt with Dr. Bertis Ruddy to review CT again, she can bring a family member or friend to appt.

## 2022-07-04 ENCOUNTER — Other Ambulatory Visit: Payer: Self-pay | Admitting: Oncology

## 2022-07-04 ENCOUNTER — Encounter: Payer: Self-pay | Admitting: Oncology

## 2022-07-04 NOTE — Progress Notes (Signed)
Gynecologic Oncology Multi-Disciplinary Disposition Conference Note  Date of the Conference: 07/04/2022  Patient Name: Kayla Mcgrath [facility]  Referring Provider: Dr. Jolayne Panther Primary GYN Oncologist: Dr. Pricilla Holm   Stage/Disposition:  Stage IIIC2ii uterine carcinosarcoma. Disposition is for a PET scan followed by consideration for radiation to the periaortic and iliac lymph nodes.   This Multidisciplinary conference took place involving physicians from Gynecologic Oncology, Medical Oncology, Radiation Oncology, Pathology, Radiology along with the Gynecologic Oncology Nurse Practitioner and Gynecologic Oncology Nurse Navigator.  Comprehensive assessment of the patient's malignancy, staging, need for surgery, chemotherapy, radiation therapy, and need for further testing were reviewed. Supportive measures, both inpatient and following discharge were also discussed. The recommended plan of care is documented. Greater than 35 minutes were spent correlating and coordinating this patient's care.

## 2022-07-07 ENCOUNTER — Encounter: Payer: Self-pay | Admitting: Hematology and Oncology

## 2022-07-07 ENCOUNTER — Inpatient Hospital Stay (HOSPITAL_BASED_OUTPATIENT_CLINIC_OR_DEPARTMENT_OTHER): Payer: Medicare HMO | Admitting: Hematology and Oncology

## 2022-07-07 VITALS — BP 151/74 | HR 85 | Temp 98.8°F | Resp 18 | Ht 59.5 in | Wt 142.2 lb

## 2022-07-07 DIAGNOSIS — C549 Malignant neoplasm of corpus uteri, unspecified: Secondary | ICD-10-CM

## 2022-07-07 DIAGNOSIS — Z9011 Acquired absence of right breast and nipple: Secondary | ICD-10-CM | POA: Diagnosis not present

## 2022-07-07 DIAGNOSIS — N183 Chronic kidney disease, stage 3 unspecified: Secondary | ICD-10-CM | POA: Diagnosis not present

## 2022-07-07 DIAGNOSIS — Z5112 Encounter for antineoplastic immunotherapy: Secondary | ICD-10-CM | POA: Diagnosis not present

## 2022-07-07 DIAGNOSIS — D61818 Other pancytopenia: Secondary | ICD-10-CM | POA: Diagnosis not present

## 2022-07-07 DIAGNOSIS — Z90710 Acquired absence of both cervix and uterus: Secondary | ICD-10-CM | POA: Diagnosis not present

## 2022-07-07 NOTE — Assessment & Plan Note (Signed)
I have reviewed her recent surgical report and imaging study with the patient The case was also discussed at the GYN oncology tumor board She has persistent disease noted on lymph nodes but is not clear whether those lymph nodes are still active We discussed risk and benefits of PET/CT imaging followed by radiation therapy in addition to her maintenance trastuzumab versus observation with follow-up imaging study in 3 months The patient is undecided but is currently leaning towards repeat imaging study in a few months I will see her back next month for further follow-up

## 2022-07-07 NOTE — Progress Notes (Signed)
[facility] OFFICE PROGRESS NOTE  Patient Care Team: Carver Fila, MD as PCP - General (Gynecologic Oncology) Hannah Beat, MD as Consulting Physician [facility])  ASSESSMENT & PLAN:  Carcinosarcoma of body of uterus [facility]) I have reviewed her recent surgical report and imaging study with the patient The case was also discussed at the GYN oncology tumor board She has persistent disease noted on lymph nodes but is not clear whether those lymph nodes are still active We discussed risk and benefits of PET/CT imaging followed by radiation therapy in addition to her maintenance trastuzumab versus observation with follow-up imaging study in 3 months The patient is undecided but is currently leaning towards repeat imaging study in a few months I will see her back next month for further follow-up  No orders of the defined types were placed in this encounter.   All questions were answered. The patient knows to call the clinic with any problems, questions or concerns. The total time spent in the appointment was 30 minutes encounter with patients including review of chart and various tests results, discussions about plan of care and coordination of care plan   Artis Delay, MD 07/07/2022 2:53 PM  INTERVAL HISTORY: Please see below for problem oriented charting. she returns for discussion related to recent abnormal CT imaging We discussed her test results I also reviewed her blood work with the patient We discussed risk, benefits, side effects of radiation treatment and additional imaging modality such as PET/CT imaging  REVIEW OF SYSTEMS:   Constitutional: Denies fevers, chills or abnormal weight loss Eyes: Denies blurriness of vision Ears, nose, mouth, throat, and face: Denies mucositis or sore throat Respiratory: Denies cough, dyspnea or wheezes Cardiovascular: Denies palpitation, chest discomfort or lower extremity swelling Gastrointestinal:  Denies nausea,  heartburn or change in bowel habits Skin: Denies abnormal skin rashes Lymphatics: Denies new lymphadenopathy or easy bruising Neurological:Denies numbness, tingling or new weaknesses Behavioral/Psych: Mood is stable, no new changes  All other systems were reviewed with the patient and are negative.  I have reviewed the past medical history, past surgical history, social history and family history with the patient and they are unchanged from previous note.  ALLERGIES:  is allergic to tylenol [acetaminophen] and latex.  MEDICATIONS:  Current Outpatient Medications  Medication Sig Dispense Refill   Cyanocobalamin (VITAMIN B 12 PO) Take 1 tablet by mouth.     escitalopram (LEXAPRO) 10 MG tablet TAKE 1 TABLET EVERY DAY 90 tablet 3   lidocaine-prilocaine (EMLA) cream Apply to affected area once 30 g 3   Magnesium 200 MG TABS Take 2 tablets by mouth daily.     ondansetron (ZOFRAN) 8 MG tablet Take 1 tablet (8 mg total) by mouth every 8 (eight) hours as needed for nausea or vomiting. Start on the third day after chemotherapy. 30 tablet 1   pantoprazole (PROTONIX) 40 MG tablet TAKE 1 TABLET BY MOUTH TWICE A DAY 180 tablet 3   prochlorperazine (COMPAZINE) 10 MG tablet Take 1 tablet (10 mg total) by mouth every 6 (six) hours as needed for nausea or vomiting. 30 tablet 1   VITAMIN D, CHOLECALCIFEROL, PO Take 1 tablet by mouth daily.     No current facility-administered medications for this visit.    SUMMARY OF ONCOLOGIC HISTORY: Oncology History Overview Note  MMR normal MSI stable PD-L1 CPS: 1% Her2 positive P53 + Carcinosarcoma Neg genetics ER negative   Carcinosarcoma of body of uterus (HCC)  09/23/2021 Initial Diagnosis   She  presented with PMB   09/28/2021 Imaging   US pelvis Enlarged uterus with fibroids. Pelvic sonogram is otherwise unremarkable.    11/11/2021 Pathology Results   A. CERVIX, MASS, EXCISION:  - HIGH-GRADE UNDIFFERENTIATED MALIGNANCY WITH EXTENSIVE NECROSIS (SEE  COMMENT).   COMMENT:  The tumor consists of mostly high-grade sarcomatoid morphology with focal epithelioid morphology.  Immunohistochemical stains are performed on block A4.  The tumor cells are positive for desmin and p16 while negative for CD45, CK7, CK20, chromogranin, synaptophysin, Melan-A, p40, AE1/AE3, SMA, p63, cyclin D1, S100 and smooth muscle myosin.  Ki-67 proliferation index is high.  CD10 staining shows focal nonspecific staining.  The differential diagnosis includes a high-grade sarcoma and a partially sampled carcinosarcoma.    11/25/2021 Imaging   MRI pelvis Marked distention of the entire endometrial cavity by heterogeneously enhancing soft tissue density, which extends through the endocervical canal into the lower vagina. This is highly suspicious for endometrial carcinoma.   Diffuse myometrial thinning due to marked distention of endometrial cavity limits evaluation; deep myometrial invasion cannot be excluded in the uterine fundus.   No evidence of extra-uterine extension of tumor.   Lymphadenopathy in lower abdominal retroperitoneum, bilateral iliac chains, and sigmoid mesocolon, consistent with metastatic disease.   Sigmoid diverticulosis. No radiographic evidence of diverticulitis.   11/26/2021 Initial Diagnosis   Uterine cancer (HCC)   11/26/2021 Cancer Staging   Staging form: Corpus Uteri - Carcinoma and Carcinosarcoma, AJCC 8th Edition - Clinical stage from 11/26/2021: FIGO Stage IIIC2 (cT3, cN2, cM0) - Signed by Artis Delay, MD on 11/26/2021 Stage prefix: Initial diagnosis Histologic grade (G): G3 Histologic grading system: 3 grade system   11/30/2021 Pathology Results   FINAL MICROSCOPIC DIAGNOSIS:  A. UTERINE CONTENTS, BIOPSY: - HIGH-GRADE UNDIFFERENTIATED MALIGNANCY WITH EXTENSIVE NECROSIS (SEE COMMENT).  COMMENT: The patient's history of a high-grade undifferentiated malignancy with extensive necrosis of the cervix is noted.  The morphological  features of the current tumor cells are similar to the tumor cells seen in the previous cervical mass excision.  Immunohistochemical staining for desmin and cytokeratin AE1/AE3 is performed on block A1.  The tumor cells are diffusely positive for desmin.   AE1/AE3 stain is focally positive, however this is scant and of unclear clinical significance. Given the small quantity of tissue in the current biopsy, a definitive distinction between a high-grade sarcoma and carcinosarcoma cannot be made as this biopsy may not be representative of the entire tumor. If clinically indicated, this case as well as the previous cervical mass excision ([id number]) may be sent out for expert consultation. Clinical correlation recommended.     12/01/2021 Tumor Marker   Patient's tumor was tested for the following markers: CA-125. Results of the tumor marker test revealed 491.   12/22/2021 Surgery   Robotic-assisted laparoscopic total hysterectomy with bilateral salpingo-oophorectomy, tumor debulking including high common iliac lymph node excision, aborted attempt of bilateral external iliac enlarged lymph nodes, mini-lap for specimen delivery, cystoscopy    12/22/2021 Pathology Results   FINAL MICROSCOPIC DIAGNOSIS:  A. LEFT CERVICAL MARGIN, EXCISION: - Positive for carcinoma  B. HIGH LEFT COMMON ILIAC LYMPH NODE, EXCISION: - Metastatic carcinosarcoma involving the lymph node  C. 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, including undifferentiated sarcoma with rhabdoid features and high-grade serous carcinoma, see comment - Tumor invades for a depth of 1 mm where myometrial thickness is 5 mm - Benign bilateral fallopian tubes and ovaries - See oncology table - See comment  ONCOLOGY TABLE:  UTERUS, CARCINOMA OR CARCINOSARCOMA: Resection  Procedure: Total hysterectomy and bilateral salpingo-oophorectomy Histologic Type:  Carcinosarcoma (mixed malignant Mullerian tumor) Histologic Grade: High-grade Myometrial Invasion:      Depth of Myometrial Invasion (mm): 1 mm      Myometrial Thickness (mm): 5 mm      Percentage of Myometrial Invasion: 20% Uterine Serosa Involvement: Not identified Cervical stromal Involvement: Present Extent of involvement of other tissue/organs: Not identified Peritoneal/Ascitic Fluid: Not applicable Lymphovascular Invasion: Present Regional Lymph Nodes:      Pelvic Lymph Nodes Examined:                                  0 Sentinel                                  1 Non-sentinel                                  1 Total      Pelvic Lymph Nodes with Metastasis: 1                          Macrometastasis: (>2.0 mm): 1                          Micrometastasis: (>0.2 mm and < 2.0 mm): 0                          Isolated Tumor Cells (<0.2 mm): 0                          Laterality of Lymph Node with Tumor: Left                          Extracapsular Extension: Not identified      Para-aortic Lymph Nodes Examined:                                   0 Sentinel                                   0 non-sentinel                                   0 total Distant Metastasis:      Distant Site(s) Involved: Not applicable Pathologic Stage Classification (pTNM, AJCC 8th Edition): pT1a, pN1 Ancillary Studies: MMR / MSI testing will be ordered Representative Tumor Block: C1 Comment(s): None  ([id number])       01/13/2022 Procedure   Placement of single lumen port a cath via right internal jugular vein. The catheter tip lies at the cavo-atrial junction. A power injectable port a cath was placed and is ready for immediate use.     01/19/2022 Echocardiogram    1. Left ventricular ejection fraction, by estimation, is 60 to 65%. The left ventricle has normal function. The left ventricle has no regional wall motion abnormalities.  Left ventricular diastolic parameters are consistent with Grade I  diastolic dysfunction (impaired relaxation). The average left ventricular global longitudinal strain is -24.0 %. The global longitudinal strain is normal.  2. Right ventricular systolic function is normal. The right ventricular size is normal. There is normal pulmonary artery systolic pressure.  3. Left atrial size was mildly dilated.  4. The mitral valve is normal in structure. No evidence of mitral valve regurgitation. No evidence of mitral stenosis.  5. The aortic valve was not well visualized. Aortic valve regurgitation is not visualized. Aortic valve sclerosis is present, with no evidence of aortic valve stenosis.  6. The inferior vena cava is normal in size with greater than 50% respiratory variability, suggesting right atrial pressure of 3 mmHg.     01/27/2022 Tumor Marker   Patient's tumor was tested for the following markers: CA-125. Results of the tumor marker test revealed 307.   02/14/2022 -  Chemotherapy   Patient is on Treatment Plan : UTERINE SEROUS CARCINOMA Carboplatin + Paclitaxel + Trastuzumab q21d x 6 Cycles / Trastuzumab q21d     02/15/2022 Tumor Marker   Patient's tumor was tested for the following markers: CA-125. Results of the tumor marker test revealed 161.   03/29/2022 Tumor Marker   Patient's tumor was tested for the following markers: CA-125. Results of the tumor marker test revealed 30.   04/13/2022 Echocardiogram   1. Left ventricular ejection fraction, by estimation, is 60 to 65%. The left ventricle has normal function. The left ventricle has no regional wall motion abnormalities. Left ventricular diastolic parameters are consistent with Grade I diastolic dysfunction (impaired relaxation). The average left ventricular global longitudinal strain is -19.4 %. The global longitudinal strain is normal.  2. Right ventricular systolic function is normal. The right ventricular size is normal. There is normal pulmonary artery systolic pressure. The estimated right ventricular  systolic pressure is 21.0 mmHg.  3. Left atrial size was mild to moderately dilated.  4. The mitral valve is grossly normal. Trivial mitral valve regurgitation.  5. The aortic valve is tricuspid. Aortic valve regurgitation is not visualized. Aortic valve sclerosis is present, with no evidence of aortic valve stenosis.  6. The inferior vena cava is normal in size with greater than 50% respiratory variability, suggesting right atrial pressure of 3 mmHg.   06/01/2022 Tumor Marker   Patient's tumor was tested for the following markers: CA-125. Results of the tumor marker test revealed 8.   06/05/2022 Genetic Testing   Negative genetic testing on the Multi-Cancer gene panel + RNA.  The report date is Jun 05, 2022.  The Multi-Cancer + RNA Panel offered by [facility] includes sequencing and/or deletion/duplication analysis of the following 70 genes:  AIP*, ALK, APC*, ATM*, AXIN2*, BAP1*, BARD1*, BLM*, BMPR1A*, BRCA1*, BRCA2*, BRIP1*, CDC73*, CDH1*, CDK4, CDKN1B*, CDKN2A, CHEK2*, CTNNA1*, DICER1*, EPCAM (del/dup only), EGFR, FH*, FLCN*, GREM1 (promoter dup only), HOXB13, KIT, LZTR1, MAX*, MBD4, MEN1*, MET, MITF, MLH1*, MSH2*, MSH3*, MSH6*, MUTYH*, NF1*, NF2*, NTHL1*, PALB2*, PDGFRA, PMS2*, POLD1*, POLE*, POT1*, PRKAR1A*, PTCH1*, PTEN*, RAD51C*, RAD51D*, RB1*, RET, SDHA* (sequencing only), SDHAF2*, SDHB*, SDHC*, SDHD*, SMAD4*, SMARCA4*, SMARCB1*, SMARCE1*, STK11*, SUFU*, TMEM127*, TP53*, TSC1*, TSC2*, VHL*. RNA analysis is performed for * genes.    06/20/2022 Imaging   1. Enlarged periaortic and iliac lymph nodes consistent metastatic disease. Bulky iliac node on the LEFT. 2. No evidence of peritoneal metastasis. 3. Post hysterectomy and oophorectomy.     PHYSICAL EXAMINATION: ECOG PERFORMANCE STATUS: 0 - Asymptomatic  Vitals:   07/07/22  1313  BP: (!) 151/74  Pulse: 85  Resp: 18  Temp: 98.8 F (37.1 C)  SpO2: 100%   Filed Weights   07/07/22 1313  Weight: 142 lb 3.2 oz (64.5 kg)     GENERAL:alert, no distress and comfortable NEURO: alert & oriented x 3 with fluent speech, no focal motor/sensory deficits  LABORATORY DATA:  I have reviewed the data as listed    Component Value Date/Time   NA 141 06/20/2022 1341   NA 140 11/02/2016 1130   K 3.5 06/20/2022 1341   K 3.8 11/02/2016 1130   CL 108 06/20/2022 1341   CL 105 05/02/2012 0822   CO2 24 06/20/2022 1341   CO2 26 11/02/2016 1130   GLUCOSE 100 (H) 06/20/2022 1341   GLUCOSE 87 11/02/2016 1130   GLUCOSE 98 05/02/2012 0822   BUN 22 06/20/2022 1341   BUN 14.7 11/02/2016 1130   CREATININE 1.14 (H) 06/20/2022 1341   CREATININE 1.05 (H) 05/30/2022 0916   CREATININE 0.84 12/05/2018 1014   CREATININE 0.8 11/02/2016 1130   CALCIUM 9.2 06/20/2022 1341   CALCIUM 9.7 11/02/2016 1130   PROT 7.0 06/20/2022 1341   PROT 6.8 11/02/2016 1130   ALBUMIN 3.9 06/20/2022 1341   ALBUMIN 3.6 11/02/2016 1130   AST 23 06/20/2022 1341   AST 21 05/30/2022 0916   AST 23 11/02/2016 1130   ALT 18 06/20/2022 1341   ALT 15 05/30/2022 0916   ALT 17 11/02/2016 1130   ALKPHOS 95 06/20/2022 1341   ALKPHOS 59 11/02/2016 1130   BILITOT 0.3 06/20/2022 1341   BILITOT 0.3 05/30/2022 0916   BILITOT 0.34 11/02/2016 1130   GFRNONAA 50 (L) 06/20/2022 1341   GFRNONAA 55 (L) 05/30/2022 0916   GFRAA >60 11/08/2017 0504   GFRAA >60 07/25/2017 1425    No results found for: "SPEP", "UPEP"  Lab Results  Component Value Date   WBC 2.6 (L) 06/20/2022   NEUTROABS 1.4 (L) 06/20/2022   HGB 8.2 (L) 06/20/2022   HCT 25.0 (L) 06/20/2022   MCV 100.8 (H) 06/20/2022   PLT 113 (L) 06/20/2022      Chemistry      Component Value Date/Time   NA 141 06/20/2022 1341   NA 140 11/02/2016 1130   K 3.5 06/20/2022 1341   K 3.8 11/02/2016 1130   CL 108 06/20/2022 1341   CL 105 05/02/2012 0822   CO2 24 06/20/2022 1341   CO2 26 11/02/2016 1130   BUN 22 06/20/2022 1341   BUN 14.7 11/02/2016 1130   CREATININE 1.14 (H) 06/20/2022 1341   CREATININE  1.05 (H) 05/30/2022 0916   CREATININE 0.84 12/05/2018 1014   CREATININE 0.8 11/02/2016 1130      Component Value Date/Time   CALCIUM 9.2 06/20/2022 1341   CALCIUM 9.7 11/02/2016 1130   ALKPHOS 95 06/20/2022 1341   ALKPHOS 59 11/02/2016 1130   AST 23 06/20/2022 1341   AST 21 05/30/2022 0916   AST 23 11/02/2016 1130   ALT 18 06/20/2022 1341   ALT 15 05/30/2022 0916   ALT 17 11/02/2016 1130   BILITOT 0.3 06/20/2022 1341   BILITOT 0.3 05/30/2022 0916   BILITOT 0.34 11/02/2016 1130       RADIOGRAPHIC STUDIES: I have reviewed multiple imaging studies with the patient and her daughter I have personally reviewed the radiological images as listed and agreed with the findings in the report. CT ABDOMEN PELVIS W CONTRAST  Result Date: 06/17/2022 CLINICAL DATA:  "Hx of Uterine ca  dx'd 2023, chemo compete, immunotherapy in progress, hx of rt.breast ca, rt.mastectomy, TAH, pt.denies any abd/pelvic complaintsEndometrial cancer, staging, high grade tumor assessment uterine sarcoma s/p surgery and chemo EXAM: CT ABDOMEN AND PELVIS WITH CONTRAST TECHNIQUE: Multidetector CT imaging of the abdomen and pelvis was performed using the standard protocol following bolus administration of intravenous contrast. RADIATION DOSE REDUCTION: This exam was performed according to the departmental dose-optimization program which includes automated exposure control, adjustment of the mA and/or kV according to patient size and/or use of iterative reconstruction technique. CONTRAST:  OMNIPAQUE IOHEXOL 300 MG/ML  SOLN COMPARISON:  CT 12/07/2021 FINDINGS: Lower chest: Lung bases are clear. Hepatobiliary: No focal hepatic lesion. Normal gallbladder. No biliary duct dilatation. Common bile duct is normal. Pancreas: Pancreas is normal. No ductal dilatation. No pancreatic inflammation. Spleen: Normal spleen Adrenals/urinary tract: Adrenal glands and kidneys are normal. The ureters and bladder normal. Stomach/Bowel: The stomach,  duodenum, and small bowel normal. Multiple diverticula of the descending colon and sigmoid colon without acute inflammation. Vascular/Lymphatic: Abdominal aorta is normal caliber. Retroperitoneal lymph node just LEFT of the aortic bifurcation measures 13 mm (image 38/2). LEFT retroperitoneal node at the level of the LEFT renal vein measures 12 mm (image 27). RIGHT common iliac node measures 16 mm on image 49. Reproductive: Post hysterectomy. Surgical history of bilateral oophorectomy. Enlarged LEFT iliac lymph node measuring 2.9 by 2.2 cm (image 59/2.). RIGHT iliac lymph node 13 mm x 16 mm on image 62/2. Other: no peritoneal metastasis. No omental metastasis. No free fluid. Musculoskeletal: No aggressive osseous lesion. IMPRESSION: 1. Enlarged periaortic and iliac lymph nodes consistent metastatic disease. Bulky iliac node on the LEFT. 2. No evidence of peritoneal metastasis. 3. Post hysterectomy and oophorectomy. . Electronically Signed   By: Genevive Bi M.D.   On: 06/17/2022 16:15

## 2022-07-09 ENCOUNTER — Other Ambulatory Visit: Payer: Self-pay

## 2022-07-11 ENCOUNTER — Inpatient Hospital Stay: Payer: Medicare HMO

## 2022-07-11 ENCOUNTER — Telehealth: Payer: Self-pay

## 2022-07-11 ENCOUNTER — Inpatient Hospital Stay: Payer: Medicare HMO | Admitting: Hematology and Oncology

## 2022-07-11 ENCOUNTER — Other Ambulatory Visit: Payer: Self-pay

## 2022-07-11 ENCOUNTER — Other Ambulatory Visit: Payer: Self-pay | Admitting: Hematology and Oncology

## 2022-07-11 ENCOUNTER — Inpatient Hospital Stay: Payer: Medicare HMO | Attending: Hematology and Oncology

## 2022-07-11 VITALS — BP 135/92 | HR 81 | Temp 98.6°F | Resp 17 | Wt 140.4 lb

## 2022-07-11 DIAGNOSIS — C549 Malignant neoplasm of corpus uteri, unspecified: Secondary | ICD-10-CM | POA: Insufficient documentation

## 2022-07-11 DIAGNOSIS — Z5112 Encounter for antineoplastic immunotherapy: Secondary | ICD-10-CM | POA: Diagnosis not present

## 2022-07-11 DIAGNOSIS — N183 Chronic kidney disease, stage 3 unspecified: Secondary | ICD-10-CM

## 2022-07-11 LAB — COMPREHENSIVE METABOLIC PANEL
ALT: 21 U/L (ref 0–44)
AST: 26 U/L (ref 15–41)
Albumin: 3.9 g/dL (ref 3.5–5.0)
Alkaline Phosphatase: 101 U/L (ref 38–126)
Anion gap: 7 (ref 5–15)
BUN: 22 mg/dL (ref 8–23)
CO2: 25 mmol/L (ref 22–32)
Calcium: 8.9 mg/dL (ref 8.9–10.3)
Chloride: 106 mmol/L (ref 98–111)
Creatinine, Ser: 1.14 mg/dL — ABNORMAL HIGH (ref 0.44–1.00)
GFR, Estimated: 50 mL/min — ABNORMAL LOW (ref >60)
Glucose, Bld: 91 mg/dL (ref 70–99)
Potassium: 4.1 mmol/L (ref 3.5–5.1)
Sodium: 138 mmol/L (ref 135–145)
Total Bilirubin: 0.4 mg/dL (ref 0.3–1.2)
Total Protein: 7.3 g/dL (ref 6.5–8.1)

## 2022-07-11 LAB — CBC WITH DIFFERENTIAL/PLATELET
Abs Immature Granulocytes: 0 10*3/uL (ref 0.00–0.07)
Basophils Absolute: 0 10*3/uL (ref 0.0–0.1)
Basophils Relative: 1 %
Eosinophils Absolute: 0.2 10*3/uL (ref 0.0–0.5)
Eosinophils Relative: 5 %
HCT: 32.3 % — ABNORMAL LOW (ref 36.0–46.0)
Hemoglobin: 11.1 g/dL — ABNORMAL LOW (ref 12.0–15.0)
Immature Granulocytes: 0 %
Lymphocytes Relative: 30 %
Lymphs Abs: 1.1 10*3/uL (ref 0.7–4.0)
MCH: 34.8 pg — ABNORMAL HIGH (ref 26.0–34.0)
MCHC: 34.4 g/dL (ref 30.0–36.0)
MCV: 101.3 fL — ABNORMAL HIGH (ref 80.0–100.0)
Monocytes Absolute: 0.5 10*3/uL (ref 0.1–1.0)
Monocytes Relative: 14 %
Neutro Abs: 1.9 10*3/uL (ref 1.7–7.7)
Neutrophils Relative %: 50 %
Platelets: 188 10*3/uL (ref 150–400)
RBC: 3.19 MIL/uL — ABNORMAL LOW (ref 3.87–5.11)
RDW: 14.6 % (ref 11.5–15.5)
WBC: 3.6 10*3/uL — ABNORMAL LOW (ref 4.0–10.5)
nRBC: 0 % (ref 0.0–0.2)

## 2022-07-11 MED ORDER — SODIUM CHLORIDE 0.9 % IV SOLN: Freq: Once | INTRAVENOUS | Status: AC

## 2022-07-11 MED ORDER — TRAMADOL HCL 50 MG PO TABS: 50.0000 mg | ORAL_TABLET | Freq: Four times a day (QID) | ORAL | 0 refills | Status: DC | PRN

## 2022-07-11 MED ORDER — SODIUM CHLORIDE 0.9% FLUSH
10.0000 mL | INTRAVENOUS | Status: DC | PRN
  Administered 2022-07-11: 10 mL

## 2022-07-11 MED ORDER — HEPARIN SOD (PORK) LOCK FLUSH 100 UNIT/ML IV SOLN
500.0000 [IU] | Freq: Once | INTRAVENOUS | Status: AC | PRN
  Administered 2022-07-11: 500 [IU]

## 2022-07-11 MED ORDER — TRASTUZUMAB-ANNS CHEMO 150 MG IV SOLR
6.0000 mg/kg | Freq: Once | INTRAVENOUS | Status: AC
  Administered 2022-07-11: 420 mg via INTRAVENOUS
  Filled 2022-07-11: qty 20

## 2022-07-11 NOTE — Telephone Encounter (Signed)
Called and told her that Dr. Bertis Ruddy sent Tramadol Rx to [facility] as she requested. Dr. Bertis Ruddy does not refill pain medication long term typically. If she needs another refill she will to contact PCP for refill. Ask her to call the office back for questions.

## 2022-07-11 NOTE — Telephone Encounter (Signed)
Called and given appt for Echo on 7/17 at [facility], arrive at 0845 for 9 am appt. She verbalized understanding.

## 2022-07-11 NOTE — Patient Instructions (Signed)
[facility]  Discharge Instructions: Thank you for choosing [facility] to provide your oncology and hematology care.   If you have a lab appointment with the Cancer Center, please go directly to the Cancer Center and check in at the registration area.   Wear comfortable clothing and clothing appropriate for easy access to any Portacath or PICC line.   We strive to give you quality time with your provider. You may need to reschedule your appointment if you arrive late (15 or more minutes).  Arriving late affects you and other patients whose appointments are after yours.  Also, if you miss three or more appointments without notifying the office, you may be dismissed from the clinic at the provider's discretion.      For prescription refill requests, have your pharmacy contact our office and allow 72 hours for refills to be completed.    Today you received the following chemotherapy and/or immunotherapy agents: Kanjinti.      To help prevent nausea and vomiting after your treatment, we encourage you to take your nausea medication as directed.  BELOW ARE SYMPTOMS THAT SHOULD BE REPORTED IMMEDIATELY: *FEVER GREATER THAN 100.4 F (38 C) OR HIGHER *CHILLS OR SWEATING *NAUSEA AND VOMITING THAT IS NOT CONTROLLED WITH YOUR NAUSEA MEDICATION *UNUSUAL SHORTNESS OF BREATH *UNUSUAL BRUISING OR BLEEDING *URINARY PROBLEMS (pain or burning when urinating, or frequent urination) *BOWEL PROBLEMS (unusual diarrhea, constipation, pain near the anus) TENDERNESS IN MOUTH AND THROAT WITH OR WITHOUT PRESENCE OF ULCERS (sore throat, sores in mouth, or a toothache) UNUSUAL RASH, SWELLING OR PAIN  UNUSUAL VAGINAL DISCHARGE OR ITCHING   Items with * indicate a potential emergency and should be followed up as soon as possible or go to the Emergency Department if any problems should occur.  Please show the CHEMOTHERAPY ALERT CARD or IMMUNOTHERAPY ALERT CARD at  check-in to the Emergency Department and triage nurse.  Should you have questions after your visit or need to cancel or reschedule your appointment, please contact [facility]  Dept: [phone]  and follow the prompts.  Office hours are 8:00 a.m. to 4:30 p.m. Monday - Friday. Please note that voicemails left after 4:00 p.m. may not be returned until the following business day.  We are closed weekends and major holidays. You have access to a nurse at all times for urgent questions. Please call the main number to the clinic Dept: [phone] and follow the prompts.   For any non-urgent questions, you may also contact your provider using MyChart. We now offer e-Visits for anyone 18 and older to request care online for non-urgent symptoms. For details visit [email or web address].   Also download the MyChart app! Go to the app store, search "MyChart", open the app, select [facility], and log in with your MyChart username and password.   

## 2022-07-13 LAB — CA 125: Cancer Antigen (CA) 125: 8.8 U/mL (ref 0.0–38.1)

## 2022-07-21 ENCOUNTER — Other Ambulatory Visit: Payer: Self-pay | Admitting: Hematology and Oncology

## 2022-07-21 DIAGNOSIS — Z1231 Encounter for screening mammogram for malignant neoplasm of breast: Secondary | ICD-10-CM

## 2022-07-22 ENCOUNTER — Other Ambulatory Visit: Payer: Self-pay

## 2022-07-26 ENCOUNTER — Encounter: Payer: Self-pay | Admitting: Hematology and Oncology

## 2022-07-27 ENCOUNTER — Ambulatory Visit (HOSPITAL_COMMUNITY)
Admission: RE | Admit: 2022-07-27 | Discharge: 2022-07-27 | Disposition: A | Discharge status: Home / Self Care | Payer: Medicare HMO | Source: Ambulatory Visit | Attending: Hematology and Oncology | Admitting: Hematology and Oncology

## 2022-07-27 DIAGNOSIS — C549 Malignant neoplasm of corpus uteri, unspecified: Secondary | ICD-10-CM | POA: Insufficient documentation

## 2022-07-27 DIAGNOSIS — Z01810 Encounter for preprocedural cardiovascular examination: Secondary | ICD-10-CM | POA: Diagnosis not present

## 2022-07-27 DIAGNOSIS — Z0189 Encounter for other specified special examinations: Secondary | ICD-10-CM

## 2022-07-27 LAB — ECHOCARDIOGRAM COMPLETE
AR max vel: 1.38 cm2
AV Area VTI: 1.4 cm2
AV Area mean vel: 1.35 cm2
AV Mean grad: 4 mmHg
AV Peak grad: 8.2 mmHg
Ao pk vel: 1.43 m/s
Area-P 1/2: 3.3 cm2
S' Lateral: 2.6 cm

## 2022-07-28 ENCOUNTER — Encounter: Payer: Self-pay | Admitting: Hematology and Oncology

## 2022-08-01 ENCOUNTER — Encounter: Payer: Self-pay | Admitting: Hematology and Oncology

## 2022-08-01 ENCOUNTER — Other Ambulatory Visit: Payer: Self-pay

## 2022-08-01 ENCOUNTER — Inpatient Hospital Stay: Payer: Medicare HMO

## 2022-08-01 ENCOUNTER — Inpatient Hospital Stay: Payer: Medicare HMO | Admitting: Hematology and Oncology

## 2022-08-01 VITALS — BP 149/68 | HR 66 | Resp 18

## 2022-08-01 VITALS — BP 139/71 | HR 75 | Temp 98.5°F | Resp 18 | Ht 59.5 in | Wt 142.2 lb

## 2022-08-01 DIAGNOSIS — Z5112 Encounter for antineoplastic immunotherapy: Secondary | ICD-10-CM | POA: Diagnosis not present

## 2022-08-01 DIAGNOSIS — N183 Chronic kidney disease, stage 3 unspecified: Secondary | ICD-10-CM | POA: Diagnosis not present

## 2022-08-01 DIAGNOSIS — C549 Malignant neoplasm of corpus uteri, unspecified: Secondary | ICD-10-CM | POA: Diagnosis not present

## 2022-08-01 DIAGNOSIS — D631 Anemia in chronic kidney disease: Secondary | ICD-10-CM

## 2022-08-01 LAB — CBC WITH DIFFERENTIAL/PLATELET
Abs Immature Granulocytes: 0.01 10*3/uL (ref 0.00–0.07)
Basophils Absolute: 0 10*3/uL (ref 0.0–0.1)
Basophils Relative: 1 %
Eosinophils Absolute: 0.2 10*3/uL (ref 0.0–0.5)
Eosinophils Relative: 4 %
HCT: 30.3 % — ABNORMAL LOW (ref 36.0–46.0)
Hemoglobin: 10.4 g/dL — ABNORMAL LOW (ref 12.0–15.0)
Immature Granulocytes: 0 %
Lymphocytes Relative: 27 %
Lymphs Abs: 1.1 10*3/uL (ref 0.7–4.0)
MCH: 34.1 pg — ABNORMAL HIGH (ref 26.0–34.0)
MCHC: 34.3 g/dL (ref 30.0–36.0)
MCV: 99.3 fL (ref 80.0–100.0)
Monocytes Absolute: 0.5 10*3/uL (ref 0.1–1.0)
Monocytes Relative: 13 %
Neutro Abs: 2.3 10*3/uL (ref 1.7–7.7)
Neutrophils Relative %: 55 %
Platelets: 179 10*3/uL (ref 150–400)
RBC: 3.05 MIL/uL — ABNORMAL LOW (ref 3.87–5.11)
RDW: 12.5 % (ref 11.5–15.5)
WBC: 4.1 10*3/uL (ref 4.0–10.5)
nRBC: 0 % (ref 0.0–0.2)

## 2022-08-01 LAB — COMPREHENSIVE METABOLIC PANEL
ALT: 19 U/L (ref 0–44)
AST: 22 U/L (ref 15–41)
Albumin: 3.9 g/dL (ref 3.5–5.0)
Alkaline Phosphatase: 88 U/L (ref 38–126)
Anion gap: 7 (ref 5–15)
BUN: 25 mg/dL — ABNORMAL HIGH (ref 8–23)
CO2: 26 mmol/L (ref 22–32)
Calcium: 9.2 mg/dL (ref 8.9–10.3)
Chloride: 106 mmol/L (ref 98–111)
Creatinine, Ser: 1.02 mg/dL — ABNORMAL HIGH (ref 0.44–1.00)
GFR, Estimated: 57 mL/min — ABNORMAL LOW (ref >60)
Glucose, Bld: 86 mg/dL (ref 70–99)
Potassium: 3.9 mmol/L (ref 3.5–5.1)
Sodium: 139 mmol/L (ref 135–145)
Total Bilirubin: 0.3 mg/dL (ref 0.3–1.2)
Total Protein: 6.7 g/dL (ref 6.5–8.1)

## 2022-08-01 MED ORDER — SODIUM CHLORIDE 0.9% FLUSH
10.0000 mL | Freq: Once | INTRAVENOUS | Status: AC
  Administered 2022-08-01: 10 mL

## 2022-08-01 MED ORDER — SODIUM CHLORIDE 0.9% FLUSH
10.0000 mL | INTRAVENOUS | Status: DC | PRN
  Administered 2022-08-01: 10 mL

## 2022-08-01 MED ORDER — SODIUM CHLORIDE 0.9 % IV SOLN: Freq: Once | INTRAVENOUS | Status: AC

## 2022-08-01 MED ORDER — HEPARIN SOD (PORK) LOCK FLUSH 100 UNIT/ML IV SOLN
500.0000 [IU] | Freq: Once | INTRAVENOUS | Status: AC | PRN
  Administered 2022-08-01: 500 [IU]

## 2022-08-01 MED ORDER — TRASTUZUMAB-ANNS CHEMO 150 MG IV SOLR
6.0000 mg/kg | Freq: Once | INTRAVENOUS | Status: AC
  Administered 2022-08-01: 420 mg via INTRAVENOUS
  Filled 2022-08-01: qty 20

## 2022-08-01 NOTE — Assessment & Plan Note (Signed)
She tolerated treatment well without major side effects I will see her back next month for further follow-up I plan to repeat imaging study again in September

## 2022-08-01 NOTE — Progress Notes (Signed)
[facility] OFFICE PROGRESS NOTE  Patient Care Team: Carver Fila, MD as PCP - General (Gynecologic Oncology) Hannah Beat, MD as Consulting Physician [facility] Medicine)  ASSESSMENT & PLAN:  Carcinosarcoma of body of uterus [facility]) She tolerated treatment well without major side effects I will see her back next month for further follow-up I plan to repeat imaging study again in September  Anemia in chronic kidney disease Her blood count is stable She is not symptomatic Observe only  No orders of the defined types were placed in this encounter.   All questions were answered. The patient knows to call the clinic with any problems, questions or concerns. The total time spent in the appointment was 25 minutes encounter with patients including review of chart and various tests results, discussions about plan of care and coordination of care plan   Artis Delay, MD 08/01/2022 12:48 PM  INTERVAL HISTORY: Please see below for problem oriented charting. she returns for treatment follow-up on maintenance trastuzumab She tolerated recent treatment well Her hair has started to grow back and she has some dry skin on her scalp  REVIEW OF SYSTEMS:   Constitutional: Denies fevers, chills or abnormal weight loss Eyes: Denies blurriness of vision Ears, nose, mouth, throat, and face: Denies mucositis or sore throat Respiratory: Denies cough, dyspnea or wheezes Cardiovascular: Denies palpitation, chest discomfort or lower extremity swelling Gastrointestinal:  Denies nausea, heartburn or change in bowel habits Lymphatics: Denies new lymphadenopathy or easy bruising Neurological:Denies numbness, tingling or new weaknesses Behavioral/Psych: Mood is stable, no new changes  All other systems were reviewed with the patient and are negative.  I have reviewed the past medical history, past surgical history, social history and family history with the patient and they are unchanged  from previous note.  ALLERGIES:  is allergic to tylenol [acetaminophen] and latex.  MEDICATIONS:  Current Outpatient Medications  Medication Sig Dispense Refill   Cyanocobalamin (VITAMIN B 12 PO) Take 1 tablet by mouth.     escitalopram (LEXAPRO) 10 MG tablet TAKE 1 TABLET EVERY DAY 90 tablet 3   Magnesium 200 MG TABS Take 2 tablets by mouth daily.     ondansetron (ZOFRAN) 8 MG tablet Take 1 tablet (8 mg total) by mouth every 8 (eight) hours as needed for nausea or vomiting. Start on the third day after chemotherapy. 30 tablet 1   pantoprazole (PROTONIX) 40 MG tablet TAKE 1 TABLET BY MOUTH TWICE A DAY 180 tablet 3   prochlorperazine (COMPAZINE) 10 MG tablet Take 1 tablet (10 mg total) by mouth every 6 (six) hours as needed for nausea or vomiting. 30 tablet 1   traMADol (ULTRAM) 50 MG tablet Take 1 tablet (50 mg total) by mouth every 6 (six) hours as needed. 30 tablet 0   VITAMIN D, CHOLECALCIFEROL, PO Take 1 tablet by mouth daily.     No current facility-administered medications for this visit.    SUMMARY OF ONCOLOGIC HISTORY: Oncology History Overview Note  MMR normal MSI stable PD-L1 CPS: 1% Her2 positive P53 + Carcinosarcoma Neg genetics ER negative   Carcinosarcoma of body of uterus (HCC)  09/23/2021 Initial Diagnosis   She presented with PMB   09/28/2021 Imaging   US pelvis Enlarged uterus with fibroids. Pelvic sonogram is otherwise unremarkable.    11/11/2021 Pathology Results   A. CERVIX, MASS, EXCISION:  - HIGH-GRADE UNDIFFERENTIATED MALIGNANCY WITH EXTENSIVE NECROSIS (SEE COMMENT).   COMMENT:  The tumor consists of mostly high-grade sarcomatoid morphology with focal epithelioid morphology.  Immunohistochemical stains are performed on block A4.  The tumor cells are positive for desmin and p16 while negative for CD45, CK7, CK20, chromogranin, synaptophysin, Melan-A, p40, AE1/AE3, SMA, p63, cyclin D1, S100 and smooth muscle myosin.  Ki-67 proliferation index is high.   CD10 staining shows focal nonspecific staining.  The differential diagnosis includes a high-grade sarcoma and a partially sampled carcinosarcoma.    11/25/2021 Imaging   MRI pelvis Marked distention of the entire endometrial cavity by heterogeneously enhancing soft tissue density, which extends through the endocervical canal into the lower vagina. This is highly suspicious for endometrial carcinoma.   Diffuse myometrial thinning due to marked distention of endometrial cavity limits evaluation; deep myometrial invasion cannot be excluded in the uterine fundus.   No evidence of extra-uterine extension of tumor.   Lymphadenopathy in lower abdominal retroperitoneum, bilateral iliac chains, and sigmoid mesocolon, consistent with metastatic disease.   Sigmoid diverticulosis. No radiographic evidence of diverticulitis.   11/26/2021 Initial Diagnosis   Uterine cancer (HCC)   11/26/2021 Cancer Staging   Staging form: Corpus Uteri - Carcinoma and Carcinosarcoma, AJCC 8th Edition - Clinical stage from 11/26/2021: FIGO Stage IIIC2 (cT3, cN2, cM0) - Signed by Artis Delay, MD on 11/26/2021 Stage prefix: Initial diagnosis Histologic grade (G): G3 Histologic grading system: 3 grade system   11/30/2021 Pathology Results   FINAL MICROSCOPIC DIAGNOSIS:  A. UTERINE CONTENTS, BIOPSY: - HIGH-GRADE UNDIFFERENTIATED MALIGNANCY WITH EXTENSIVE NECROSIS (SEE COMMENT).  COMMENT: The patient's history of a high-grade undifferentiated malignancy with extensive necrosis of the cervix is noted.  The morphological features of the current tumor cells are similar to the tumor cells seen in the previous cervical mass excision.  Immunohistochemical staining for desmin and cytokeratin AE1/AE3 is performed on block A1.  The tumor cells are diffusely positive for desmin.   AE1/AE3 stain is focally positive, however this is scant and of unclear clinical significance. Given the small quantity of tissue in the current biopsy, a  definitive distinction between a high-grade sarcoma and carcinosarcoma cannot be made as this biopsy may not be representative of the entire tumor. If clinically indicated, this case as well as the previous cervical mass excision ([id number]) may be sent out for expert consultation. Clinical correlation recommended.     12/01/2021 Tumor Marker   Patient's tumor was tested for the following markers: CA-125. Results of the tumor marker test revealed 491.   12/22/2021 Surgery   Robotic-assisted laparoscopic total hysterectomy with bilateral salpingo-oophorectomy, tumor debulking including high common iliac lymph node excision, aborted attempt of bilateral external iliac enlarged lymph nodes, mini-lap for specimen delivery, cystoscopy    12/22/2021 Pathology Results   FINAL MICROSCOPIC DIAGNOSIS:  A. LEFT CERVICAL MARGIN, EXCISION: - Positive for carcinoma  B. HIGH LEFT COMMON ILIAC LYMPH NODE, EXCISION: - Metastatic carcinosarcoma involving the lymph node  C. 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, including undifferentiated sarcoma with rhabdoid features and high-grade serous carcinoma, see comment - Tumor invades for a depth of 1 mm where myometrial thickness is 5 mm - Benign bilateral fallopian tubes and ovaries - See oncology table - See comment  ONCOLOGY TABLE:  UTERUS, CARCINOMA OR CARCINOSARCOMA: Resection  Procedure: Total hysterectomy and bilateral salpingo-oophorectomy Histologic Type: Carcinosarcoma (mixed malignant Mullerian tumor) Histologic Grade: High-grade Myometrial Invasion:      Depth of Myometrial Invasion (mm): 1 mm      Myometrial Thickness (mm): 5 mm      Percentage of Myometrial Invasion:  20% Uterine Serosa Involvement: Not identified Cervical stromal Involvement: Present Extent of involvement of other tissue/organs: Not identified Peritoneal/Ascitic Fluid: Not  applicable Lymphovascular Invasion: Present Regional Lymph Nodes:      Pelvic Lymph Nodes Examined:                                  0 Sentinel                                  1 Non-sentinel                                  1 Total      Pelvic Lymph Nodes with Metastasis: 1                          Macrometastasis: (>2.0 mm): 1                          Micrometastasis: (>0.2 mm and < 2.0 mm): 0                          Isolated Tumor Cells (<0.2 mm): 0                          Laterality of Lymph Node with Tumor: Left                          Extracapsular Extension: Not identified      Para-aortic Lymph Nodes Examined:                                   0 Sentinel                                   0 non-sentinel                                   0 total Distant Metastasis:      Distant Site(s) Involved: Not applicable Pathologic Stage Classification (pTNM, AJCC 8th Edition): pT1a, pN1 Ancillary Studies: MMR / MSI testing will be ordered Representative Tumor Block: C1 Comment(s): None  ([id number])       01/13/2022 Procedure   Placement of single lumen port a cath via right internal jugular vein. The catheter tip lies at the cavo-atrial junction. A power injectable port a cath was placed and is ready for immediate use.     01/19/2022 Echocardiogram    1. Left ventricular ejection fraction, by estimation, is 60 to 65%. The left ventricle has normal function. The left ventricle has no regional wall motion abnormalities. Left ventricular diastolic parameters are consistent with Grade I diastolic dysfunction (impaired relaxation). The average left ventricular global longitudinal strain is -24.0 %. The global longitudinal strain is normal.  2. Right ventricular systolic function is normal. The right ventricular size is normal. There is normal pulmonary artery systolic pressure.  3. Left atrial size was mildly dilated.  4. The mitral valve is normal in structure. No evidence of mitral  valve regurgitation. No evidence of mitral stenosis.  5. The aortic valve was not well visualized. Aortic valve regurgitation is not visualized. Aortic valve sclerosis is present, with no evidence of aortic valve stenosis.  6. The inferior vena cava is normal in size with greater than 50% respiratory variability, suggesting right atrial pressure of 3 mmHg.     01/27/2022 Tumor Marker   Patient's tumor was tested for the following markers: CA-125. Results of the tumor marker test revealed 307.   02/14/2022 -  Chemotherapy   Patient is on Treatment Plan : UTERINE SEROUS CARCINOMA Carboplatin + Paclitaxel + Trastuzumab q21d x 6 Cycles / Trastuzumab q21d     02/15/2022 Tumor Marker   Patient's tumor was tested for the following markers: CA-125. Results of the tumor marker test revealed 161.   03/29/2022 Tumor Marker   Patient's tumor was tested for the following markers: CA-125. Results of the tumor marker test revealed 30.   04/13/2022 Echocardiogram   1. Left ventricular ejection fraction, by estimation, is 60 to 65%. The left ventricle has normal function. The left ventricle has no regional wall motion abnormalities. Left ventricular diastolic parameters are consistent with Grade I diastolic dysfunction (impaired relaxation). The average left ventricular global longitudinal strain is -19.4 %. The global longitudinal strain is normal.  2. Right ventricular systolic function is normal. The right ventricular size is normal. There is normal pulmonary artery systolic pressure. The estimated right ventricular systolic pressure is 21.0 mmHg.  3. Left atrial size was mild to moderately dilated.  4. The mitral valve is grossly normal. Trivial mitral valve regurgitation.  5. The aortic valve is tricuspid. Aortic valve regurgitation is not visualized. Aortic valve sclerosis is present, with no evidence of aortic valve stenosis.  6. The inferior vena cava is normal in size with greater than 50% respiratory  variability, suggesting right atrial pressure of 3 mmHg.   06/01/2022 Tumor Marker   Patient's tumor was tested for the following markers: CA-125. Results of the tumor marker test revealed 8.   06/05/2022 Genetic Testing   Negative genetic testing on the Multi-Cancer gene panel + RNA.  The report date is Jun 05, 2022.  The Multi-Cancer + RNA Panel offered by [facility] includes sequencing and/or deletion/duplication analysis of the following 70 genes:  AIP*, ALK, APC*, ATM*, AXIN2*, BAP1*, BARD1*, BLM*, BMPR1A*, BRCA1*, BRCA2*, BRIP1*, CDC73*, CDH1*, CDK4, CDKN1B*, CDKN2A, CHEK2*, CTNNA1*, DICER1*, EPCAM (del/dup only), EGFR, FH*, FLCN*, GREM1 (promoter dup only), HOXB13, KIT, LZTR1, MAX*, MBD4, MEN1*, MET, MITF, MLH1*, MSH2*, MSH3*, MSH6*, MUTYH*, NF1*, NF2*, NTHL1*, PALB2*, PDGFRA, PMS2*, POLD1*, POLE*, POT1*, PRKAR1A*, PTCH1*, PTEN*, RAD51C*, RAD51D*, RB1*, RET, SDHA* (sequencing only), SDHAF2*, SDHB*, SDHC*, SDHD*, SMAD4*, SMARCA4*, SMARCB1*, SMARCE1*, STK11*, SUFU*, TMEM127*, TP53*, TSC1*, TSC2*, VHL*. RNA analysis is performed for * genes.    06/20/2022 Imaging   1. Enlarged periaortic and iliac lymph nodes consistent metastatic disease. Bulky iliac node on the LEFT. 2. No evidence of peritoneal metastasis. 3. Post hysterectomy and oophorectomy.   07/13/2022 Tumor Marker   Patient's tumor was tested for the following markers: CA-125. Results of the tumor marker test revealed 8.8.   07/27/2022 Echocardiogram   ECHOCARDIOGRAM COMPLETE  Result Date: 07/27/2022    ECHOCARDIOGRAM REPORT   Patient Name:   Kayla Mcgrath Orsak Date of Exam: 07/27/2022 Medical Rec #:  [id number]      Height:       59.5 in Accession #:  [id number]     Weight:       140.4 lb Date of Birth:  Nov 16, 1945      BSA:          1.596 m Patient Age:    76 years       BP:           135/92 mmHg Patient Gender: F              HR:           69 bpm. Exam Location:  Outpatient Procedure: 2D Echo, Color Doppler, Cardiac Doppler and Strain  Analysis Indications:    Chemo  History:        Patient has prior history of Echocardiogram examinations, most                 recent 04/13/2022. Breast Cancer, Uterine Cancer, CKD; Risk                 Factors:Dyslipidemia.  Sonographer:    Milbert Coulter Referring Phys: [id number] Tedd Cottrill  Sonographer Comments: Global longitudinal strain was attempted. IMPRESSIONS  1. Prior GLS was -19.4 on 04/13/22 suggest f/u echo in 3 months to make sure not seeing early myocardial issues . Left ventricular ejection fraction, by estimation, is 55 to 60%. The left ventricle has normal function. The left ventricle has no regional wall motion abnormalities. Left ventricular diastolic parameters were normal. The average left ventricular global longitudinal strain is -15.9 %. The global longitudinal strain is normal.  2. Right ventricular systolic function is normal. The right ventricular size is normal.  3. The mitral valve is normal in structure. No evidence of mitral valve regurgitation. No evidence of mitral stenosis.  4. The aortic valve was not well visualized. There is mild calcification of the aortic valve. Aortic valve regurgitation is not visualized. Aortic valve sclerosis is present, with no evidence of aortic valve stenosis.  5. The inferior vena cava is normal in size with greater than 50% respiratory variability, suggesting right atrial pressure of 3 mmHg. FINDINGS  Left Ventricle: Prior GLS was -19.4 on 04/13/22 suggest f/u echo in 3 months to make sure not seeing early myocardial issues. Left ventricular ejection fraction, by estimation, is 55 to 60%. The left ventricle has normal function. The left ventricle has no regional wall motion abnormalities. The average left ventricular global longitudinal strain is -15.9 %. The global longitudinal strain is normal. The left ventricular internal cavity size was normal in size. There is no left ventricular hypertrophy. Left ventricular diastolic parameters were normal. Right  Ventricle: The right ventricular size is normal. No increase in right ventricular wall thickness. Right ventricular systolic function is normal. Left Atrium: Left atrial size was normal in size. Right Atrium: Right atrial size was normal in size. Pericardium: There is no evidence of pericardial effusion. Mitral Valve: The mitral valve is normal in structure. No evidence of mitral valve regurgitation. No evidence of mitral valve stenosis. Tricuspid Valve: The tricuspid valve is normal in structure. Tricuspid valve regurgitation is not demonstrated. No evidence of tricuspid stenosis. Aortic Valve: The aortic valve was not well visualized. There is mild calcification of the aortic valve. Aortic valve regurgitation is not visualized. Aortic valve sclerosis is present, with no evidence of aortic valve stenosis. Aortic valve mean gradient measures 4.0 mmHg. Aortic valve peak gradient measures 8.2 mmHg. Aortic valve area, by VTI measures 1.40 cm. Pulmonic Valve: The pulmonic valve was normal in structure. Pulmonic valve regurgitation is not visualized.  No evidence of pulmonic stenosis. Aorta: The aortic root is normal in size and structure. Venous: The inferior vena cava is normal in size with greater than 50% respiratory variability, suggesting right atrial pressure of 3 mmHg. IAS/Shunts: No atrial level shunt detected by color flow Doppler.  LEFT VENTRICLE PLAX 2D LVIDd:         4.00 cm   Diastology LVIDs:         2.60 cm   LV e' medial:    5.77 cm/s LV PW:         0.90 cm   LV E/e' medial:  10.8 LV IVS:        0.80 cm   LV e' lateral:   13.50 cm/s LVOT diam:     1.70 cm   LV E/e' lateral: 4.6 LV SV:         41 LV SV Index:   26        2D Longitudinal Strain LVOT Area:     2.27 cm  2D Strain GLS Avg:     -15.9 %  RIGHT VENTRICLE RV Basal diam:  2.70 cm RV Mid diam:    2.10 cm RV S prime:     12.80 cm/s TAPSE (M-mode): 2.1 cm LEFT ATRIUM             Index        RIGHT ATRIUM          Index LA diam:        2.70 cm 1.69  cm/m   RA Area:     8.87 cm LA Vol (A2C):   35.4 ml 22.18 ml/m  RA Volume:   16.30 ml 10.21 ml/m LA Vol (A4C):   36.6 ml 22.93 ml/m LA Biplane Vol: 36.1 ml 22.62 ml/m  AORTIC VALVE AV Area (Vmax):    1.38 cm AV Area (Vmean):   1.35 cm AV Area (VTI):     1.40 cm AV Vmax:           143.00 cm/s AV Vmean:          93.400 cm/s AV VTI:            0.293 m AV Peak Grad:      8.2 mmHg AV Mean Grad:      4.0 mmHg LVOT Vmax:         87.00 cm/s LVOT Vmean:        55.700 cm/s LVOT VTI:          0.180 m LVOT/AV VTI ratio: 0.62  AORTA Ao Root diam: 2.80 cm Ao Asc diam:  3.00 cm MITRAL VALVE               TRICUSPID VALVE MV Area (PHT): 3.30 cm    TR Peak grad:   21.7 mmHg MV Decel Time: 230 msec    TR Vmax:        233.00 cm/s MV E velocity: 62.10 cm/s MV A velocity: 87.80 cm/s  SHUNTS MV E/A ratio:  0.71        Systemic VTI:  0.18 m                            Systemic Diam: 1.70 cm Charlton Haws MD Electronically signed by Charlton Haws MD Signature Date/Time: 07/27/2022/10:27:37 AM    Final         PHYSICAL EXAMINATION: ECOG PERFORMANCE STATUS: 0 - Asymptomatic  Vitals:   08/01/22  1245  BP: 139/71  Pulse: 75  Resp: 18  Temp: 98.5 F (36.9 C)  SpO2: 100%   Filed Weights   08/01/22 1245  Weight: 142 lb 3.2 oz (64.5 kg)    GENERAL:alert, no distress and comfortable SKIN: skin color, texture, turgor are normal, no rashes or significant lesions.  Noted dry skin on her scalp NEURO: alert & oriented x 3 with fluent speech, no focal motor/sensory deficits  LABORATORY DATA:  I have reviewed the data as listed    Component Value Date/Time   NA 138 07/11/2022 0916   NA 140 11/02/2016 1130   K 4.1 07/11/2022 0916   K 3.8 11/02/2016 1130   CL 106 07/11/2022 0916   CL 105 05/02/2012 0822   CO2 25 07/11/2022 0916   CO2 26 11/02/2016 1130   GLUCOSE 91 07/11/2022 0916   GLUCOSE 87 11/02/2016 1130   GLUCOSE 98 05/02/2012 0822   BUN 22 07/11/2022 0916   BUN 14.7 11/02/2016 1130   CREATININE 1.14 (H)  07/11/2022 0916   CREATININE 1.05 (H) 05/30/2022 0916   CREATININE 0.84 12/05/2018 1014   CREATININE 0.8 11/02/2016 1130   CALCIUM 8.9 07/11/2022 0916   CALCIUM 9.7 11/02/2016 1130   PROT 7.3 07/11/2022 0916   PROT 6.8 11/02/2016 1130   ALBUMIN 3.9 07/11/2022 0916   ALBUMIN 3.6 11/02/2016 1130   AST 26 07/11/2022 0916   AST 21 05/30/2022 0916   AST 23 11/02/2016 1130   ALT 21 07/11/2022 0916   ALT 15 05/30/2022 0916   ALT 17 11/02/2016 1130   ALKPHOS 101 07/11/2022 0916   ALKPHOS 59 11/02/2016 1130   BILITOT 0.4 07/11/2022 0916   BILITOT 0.3 05/30/2022 0916   BILITOT 0.34 11/02/2016 1130   GFRNONAA 50 (L) 07/11/2022 0916   GFRNONAA 55 (L) 05/30/2022 0916   GFRAA >60 11/08/2017 0504   GFRAA >60 07/25/2017 1425    No results found for: "SPEP", "UPEP"  Lab Results  Component Value Date   WBC 4.1 08/01/2022   NEUTROABS 2.3 08/01/2022   HGB 10.4 (L) 08/01/2022   HCT 30.3 (L) 08/01/2022   MCV 99.3 08/01/2022   PLT 179 08/01/2022      Chemistry      Component Value Date/Time   NA 138 07/11/2022 0916   NA 140 11/02/2016 1130   K 4.1 07/11/2022 0916   K 3.8 11/02/2016 1130   CL 106 07/11/2022 0916   CL 105 05/02/2012 0822   CO2 25 07/11/2022 0916   CO2 26 11/02/2016 1130   BUN 22 07/11/2022 0916   BUN 14.7 11/02/2016 1130   CREATININE 1.14 (H) 07/11/2022 0916   CREATININE 1.05 (H) 05/30/2022 0916   CREATININE 0.84 12/05/2018 1014   CREATININE 0.8 11/02/2016 1130      Component Value Date/Time   CALCIUM 8.9 07/11/2022 0916   CALCIUM 9.7 11/02/2016 1130   ALKPHOS 101 07/11/2022 0916   ALKPHOS 59 11/02/2016 1130   AST 26 07/11/2022 0916   AST 21 05/30/2022 0916   AST 23 11/02/2016 1130   ALT 21 07/11/2022 0916   ALT 15 05/30/2022 0916   ALT 17 11/02/2016 1130   BILITOT 0.4 07/11/2022 0916   BILITOT 0.3 05/30/2022 0916   BILITOT 0.34 11/02/2016 1130       RADIOGRAPHIC STUDIES: I have personally reviewed the radiological images as listed and agreed with  the findings in the report. ECHOCARDIOGRAM COMPLETE  Result Date: 07/27/2022    ECHOCARDIOGRAM REPORT   Patient Name:  Kayla Mcgrath Date of Exam: 07/27/2022 Medical Rec #:  [id number]      Height:       59.5 in Accession #:    [id number]     Weight:       140.4 lb Date of Birth:  1945-06-16      BSA:          1.596 m Patient Age:    76 years       BP:           135/92 mmHg Patient Gender: F              HR:           69 bpm. Exam Location:  Outpatient Procedure: 2D Echo, Color Doppler, Cardiac Doppler and Strain Analysis Indications:    Chemo  History:        Patient has prior history of Echocardiogram examinations, most                 recent 04/13/2022. Breast Cancer, Uterine Cancer, CKD; Risk                 Factors:Dyslipidemia.  Sonographer:    Milbert Coulter Referring Phys: [id number] Adaia Matthies  Sonographer Comments: Global longitudinal strain was attempted. IMPRESSIONS  1. Prior GLS was -19.4 on 04/13/22 suggest f/u echo in 3 months to make sure not seeing early myocardial issues . Left ventricular ejection fraction, by estimation, is 55 to 60%. The left ventricle has normal function. The left ventricle has no regional wall motion abnormalities. Left ventricular diastolic parameters were normal. The average left ventricular global longitudinal strain is -15.9 %. The global longitudinal strain is normal.  2. Right ventricular systolic function is normal. The right ventricular size is normal.  3. The mitral valve is normal in structure. No evidence of mitral valve regurgitation. No evidence of mitral stenosis.  4. The aortic valve was not well visualized. There is mild calcification of the aortic valve. Aortic valve regurgitation is not visualized. Aortic valve sclerosis is present, with no evidence of aortic valve stenosis.  5. The inferior vena cava is normal in size with greater than 50% respiratory variability, suggesting right atrial pressure of 3 mmHg. FINDINGS  Left Ventricle: Prior GLS was -19.4 on 04/13/22  suggest f/u echo in 3 months to make sure not seeing early myocardial issues. Left ventricular ejection fraction, by estimation, is 55 to 60%. The left ventricle has normal function. The left ventricle has no regional wall motion abnormalities. The average left ventricular global longitudinal strain is -15.9 %. The global longitudinal strain is normal. The left ventricular internal cavity size was normal in size. There is no left ventricular hypertrophy. Left ventricular diastolic parameters were normal. Right Ventricle: The right ventricular size is normal. No increase in right ventricular wall thickness. Right ventricular systolic function is normal. Left Atrium: Left atrial size was normal in size. Right Atrium: Right atrial size was normal in size. Pericardium: There is no evidence of pericardial effusion. Mitral Valve: The mitral valve is normal in structure. No evidence of mitral valve regurgitation. No evidence of mitral valve stenosis. Tricuspid Valve: The tricuspid valve is normal in structure. Tricuspid valve regurgitation is not demonstrated. No evidence of tricuspid stenosis. Aortic Valve: The aortic valve was not well visualized. There is mild calcification of the aortic valve. Aortic valve regurgitation is not visualized. Aortic valve sclerosis is present, with no evidence of aortic valve stenosis. Aortic valve mean gradient measures 4.0 mmHg.  Aortic valve peak gradient measures 8.2 mmHg. Aortic valve area, by VTI measures 1.40 cm. Pulmonic Valve: The pulmonic valve was normal in structure. Pulmonic valve regurgitation is not visualized. No evidence of pulmonic stenosis. Aorta: The aortic root is normal in size and structure. Venous: The inferior vena cava is normal in size with greater than 50% respiratory variability, suggesting right atrial pressure of 3 mmHg. IAS/Shunts: No atrial level shunt detected by color flow Doppler.  LEFT VENTRICLE PLAX 2D LVIDd:         4.00 cm   Diastology LVIDs:          2.60 cm   LV e' medial:    5.77 cm/s LV PW:         0.90 cm   LV E/e' medial:  10.8 LV IVS:        0.80 cm   LV e' lateral:   13.50 cm/s LVOT diam:     1.70 cm   LV E/e' lateral: 4.6 LV SV:         41 LV SV Index:   26        2D Longitudinal Strain LVOT Area:     2.27 cm  2D Strain GLS Avg:     -15.9 %  RIGHT VENTRICLE RV Basal diam:  2.70 cm RV Mid diam:    2.10 cm RV S prime:     12.80 cm/s TAPSE (M-mode): 2.1 cm LEFT ATRIUM             Index        RIGHT ATRIUM          Index LA diam:        2.70 cm 1.69 cm/m   RA Area:     8.87 cm LA Vol (A2C):   35.4 ml 22.18 ml/m  RA Volume:   16.30 ml 10.21 ml/m LA Vol (A4C):   36.6 ml 22.93 ml/m LA Biplane Vol: 36.1 ml 22.62 ml/m  AORTIC VALVE AV Area (Vmax):    1.38 cm AV Area (Vmean):   1.35 cm AV Area (VTI):     1.40 cm AV Vmax:           143.00 cm/s AV Vmean:          93.400 cm/s AV VTI:            0.293 m AV Peak Grad:      8.2 mmHg AV Mean Grad:      4.0 mmHg LVOT Vmax:         87.00 cm/s LVOT Vmean:        55.700 cm/s LVOT VTI:          0.180 m LVOT/AV VTI ratio: 0.62  AORTA Ao Root diam: 2.80 cm Ao Asc diam:  3.00 cm MITRAL VALVE               TRICUSPID VALVE MV Area (PHT): 3.30 cm    TR Peak grad:   21.7 mmHg MV Decel Time: 230 msec    TR Vmax:        233.00 cm/s MV E velocity: 62.10 cm/s MV A velocity: 87.80 cm/s  SHUNTS MV E/A ratio:  0.71        Systemic VTI:  0.18 m                            Systemic Diam: 1.70 cm Charlton Haws MD Electronically signed by Charlton Haws  MD Signature Date/Time: 07/27/2022/10:27:37 AM    Final

## 2022-08-01 NOTE — Patient Instructions (Signed)
[facility]  Discharge Instructions: Thank you for choosing [facility] to provide your oncology and hematology care.   If you have a lab appointment with the Cancer Center, please go directly to the Cancer Center and check in at the registration area.   Wear comfortable clothing and clothing appropriate for easy access to any Portacath or PICC line.   We strive to give you quality time with your provider. You may need to reschedule your appointment if you arrive late (15 or more minutes).  Arriving late affects you and other patients whose appointments are after yours.  Also, if you miss three or more appointments without notifying the office, you may be dismissed from the clinic at the provider's discretion.      For prescription refill requests, have your pharmacy contact our office and allow 72 hours for refills to be completed.    Today you received the following chemotherapy and/or immunotherapy agents: Kanjinti.      To help prevent nausea and vomiting after your treatment, we encourage you to take your nausea medication as directed.  BELOW ARE SYMPTOMS THAT SHOULD BE REPORTED IMMEDIATELY: *FEVER GREATER THAN 100.4 F (38 C) OR HIGHER *CHILLS OR SWEATING *NAUSEA AND VOMITING THAT IS NOT CONTROLLED WITH YOUR NAUSEA MEDICATION *UNUSUAL SHORTNESS OF BREATH *UNUSUAL BRUISING OR BLEEDING *URINARY PROBLEMS (pain or burning when urinating, or frequent urination) *BOWEL PROBLEMS (unusual diarrhea, constipation, pain near the anus) TENDERNESS IN MOUTH AND THROAT WITH OR WITHOUT PRESENCE OF ULCERS (sore throat, sores in mouth, or a toothache) UNUSUAL RASH, SWELLING OR PAIN  UNUSUAL VAGINAL DISCHARGE OR ITCHING   Items with * indicate a potential emergency and should be followed up as soon as possible or go to the Emergency Department if any problems should occur.  Please show the CHEMOTHERAPY ALERT CARD or IMMUNOTHERAPY ALERT CARD at  check-in to the Emergency Department and triage nurse.  Should you have questions after your visit or need to cancel or reschedule your appointment, please contact [facility]  Dept: [phone]  and follow the prompts.  Office hours are 8:00 a.m. to 4:30 p.m. Monday - Friday. Please note that voicemails left after 4:00 p.m. may not be returned until the following business day.  We are closed weekends and major holidays. You have access to a nurse at all times for urgent questions. Please call the main number to the clinic Dept: [phone] and follow the prompts.   For any non-urgent questions, you may also contact your provider using MyChart. We now offer e-Visits for anyone 18 and older to request care online for non-urgent symptoms. For details visit [email or web address].   Also download the MyChart app! Go to the app store, search "MyChart", open the app, select [facility], and log in with your MyChart username and password.   

## 2022-08-01 NOTE — Assessment & Plan Note (Signed)
Her blood count is stable She is not symptomatic Observe only

## 2022-08-22 ENCOUNTER — Inpatient Hospital Stay: Payer: Medicare HMO

## 2022-08-22 ENCOUNTER — Inpatient Hospital Stay: Payer: Medicare HMO | Attending: Hematology and Oncology

## 2022-08-22 ENCOUNTER — Inpatient Hospital Stay: Payer: Medicare HMO | Admitting: Hematology and Oncology

## 2022-08-22 ENCOUNTER — Encounter: Payer: Self-pay | Admitting: Hematology and Oncology

## 2022-08-22 ENCOUNTER — Other Ambulatory Visit: Payer: Self-pay

## 2022-08-22 VITALS — BP 137/77 | HR 83 | Temp 98.0°F | Resp 18 | Ht 59.5 in | Wt 144.0 lb

## 2022-08-22 DIAGNOSIS — Z5112 Encounter for antineoplastic immunotherapy: Secondary | ICD-10-CM | POA: Insufficient documentation

## 2022-08-22 DIAGNOSIS — N183 Chronic kidney disease, stage 3 unspecified: Secondary | ICD-10-CM | POA: Diagnosis not present

## 2022-08-22 DIAGNOSIS — C549 Malignant neoplasm of corpus uteri, unspecified: Secondary | ICD-10-CM

## 2022-08-22 DIAGNOSIS — D61818 Other pancytopenia: Secondary | ICD-10-CM | POA: Diagnosis not present

## 2022-08-22 LAB — COMPREHENSIVE METABOLIC PANEL
ALT: 35 U/L (ref 0–44)
AST: 35 U/L (ref 15–41)
Albumin: 3.9 g/dL (ref 3.5–5.0)
Alkaline Phosphatase: 99 U/L (ref 38–126)
Anion gap: 7 (ref 5–15)
BUN: 26 mg/dL — ABNORMAL HIGH (ref 8–23)
CO2: 28 mmol/L (ref 22–32)
Calcium: 9 mg/dL (ref 8.9–10.3)
Chloride: 105 mmol/L (ref 98–111)
Creatinine, Ser: 1.32 mg/dL — ABNORMAL HIGH (ref 0.44–1.00)
GFR, Estimated: 42 mL/min — ABNORMAL LOW (ref >60)
Glucose, Bld: 86 mg/dL (ref 70–99)
Potassium: 4 mmol/L (ref 3.5–5.1)
Sodium: 140 mmol/L (ref 135–145)
Total Bilirubin: 0.3 mg/dL (ref 0.3–1.2)
Total Protein: 6.9 g/dL (ref 6.5–8.1)

## 2022-08-22 LAB — CBC WITH DIFFERENTIAL/PLATELET
Abs Immature Granulocytes: 0.01 10*3/uL (ref 0.00–0.07)
Basophils Absolute: 0 10*3/uL (ref 0.0–0.1)
Basophils Relative: 1 %
Eosinophils Absolute: 0.1 10*3/uL (ref 0.0–0.5)
Eosinophils Relative: 4 %
HCT: 34.5 % — ABNORMAL LOW (ref 36.0–46.0)
Hemoglobin: 11.4 g/dL — ABNORMAL LOW (ref 12.0–15.0)
Immature Granulocytes: 0 %
Lymphocytes Relative: 28 %
Lymphs Abs: 1 10*3/uL (ref 0.7–4.0)
MCH: 32.3 pg (ref 26.0–34.0)
MCHC: 33 g/dL (ref 30.0–36.0)
MCV: 97.7 fL (ref 80.0–100.0)
Monocytes Absolute: 0.5 10*3/uL (ref 0.1–1.0)
Monocytes Relative: 12 %
Neutro Abs: 2.1 10*3/uL (ref 1.7–7.7)
Neutrophils Relative %: 55 %
Platelets: 196 10*3/uL (ref 150–400)
RBC: 3.53 MIL/uL — ABNORMAL LOW (ref 3.87–5.11)
RDW: 12.1 % (ref 11.5–15.5)
WBC: 3.7 10*3/uL — ABNORMAL LOW (ref 4.0–10.5)
nRBC: 0 % (ref 0.0–0.2)

## 2022-08-22 MED ORDER — SODIUM CHLORIDE 0.9% FLUSH
10.0000 mL | INTRAVENOUS | Status: DC | PRN
  Administered 2022-08-22: 10 mL

## 2022-08-22 MED ORDER — SODIUM CHLORIDE 0.9% FLUSH
10.0000 mL | Freq: Once | INTRAVENOUS | Status: AC
  Administered 2022-08-22: 10 mL

## 2022-08-22 MED ORDER — TRASTUZUMAB-ANNS CHEMO 150 MG IV SOLR
6.0000 mg/kg | Freq: Once | INTRAVENOUS | Status: AC
  Administered 2022-08-22: 420 mg via INTRAVENOUS
  Filled 2022-08-22: qty 20

## 2022-08-22 MED ORDER — HEPARIN SOD (PORK) LOCK FLUSH 100 UNIT/ML IV SOLN
500.0000 [IU] | Freq: Once | INTRAVENOUS | Status: AC | PRN
  Administered 2022-08-22: 500 [IU]

## 2022-08-22 MED ORDER — SODIUM CHLORIDE 0.9 % IV SOLN: Freq: Once | INTRAVENOUS | Status: AC

## 2022-08-22 MED ORDER — CETIRIZINE HCL 10 MG/ML IV SOLN
10.0000 mg | Freq: Once | INTRAVENOUS | Status: AC
  Administered 2022-08-22: 10 mg via INTRAVENOUS
  Filled 2022-08-22: qty 1

## 2022-08-22 NOTE — Assessment & Plan Note (Signed)
She has history of intermittent kidney failure We will monitor that closely

## 2022-08-22 NOTE — Patient Instructions (Signed)

## 2022-08-22 NOTE — Assessment & Plan Note (Signed)
She has stable pancytopenia due to treatment She is not symptomatic We will proceed with treatment without delay

## 2022-08-22 NOTE — Progress Notes (Signed)
[facility] OFFICE PROGRESS NOTE  Patient Care Team: Carver Fila, MD as PCP - General (Gynecologic Oncology) Hannah Beat, MD as Consulting Physician [facility])  ASSESSMENT & PLAN:  Carcinosarcoma of body of uterus [facility]) Her last imaging study showed persistent disease Due to high risk disease, I recommend CT imaging with a chest, abdomen and pelvis for evaluation Her previous CT chest was not normal We will proceed with treatment without delay Her recent echocardiogram was within normal limits  Pancytopenia, acquired [facility]) She has stable pancytopenia due to treatment She is not symptomatic We will proceed with treatment without delay  CKD (chronic kidney disease), stage III (HCC) She has history of intermittent kidney failure We will monitor that closely  Orders Placed This Encounter  Procedures   CT CHEST ABDOMEN PELVIS W CONTRAST    Standing Status:   Future    Standing Expiration Date:   08/22/2023    Scheduling Instructions:     No need oral contrast    Order Specific Question:   If indicated for the ordered procedure, I authorize the administration of contrast media per Radiology protocol    Answer:   Yes    Order Specific Question:   Does the patient have a contrast media/X-ray dye allergy?    Answer:   No    Order Specific Question:   Preferred imaging location?    Answer:   [facility]    Order Specific Question:   If indicated for the ordered procedure, I authorize the administration of oral contrast media per Radiology protocol    Answer:   Yes    All questions were answered. The patient knows to call the clinic with any problems, questions or concerns. The total time spent in the appointment was 30 minutes encounter with patients including review of chart and various tests results, discussions about plan of care and coordination of care plan   Artis Delay, MD 08/22/2022 8:57 AM  INTERVAL HISTORY: Please see below for  problem oriented charting. she returns for treatment follow-up She is on maintenance trastuzumab She complained of intermittent knee pain Denies recent signs or symptoms of congestive heart failure  REVIEW OF SYSTEMS:   Constitutional: Denies fevers, chills or abnormal weight loss Eyes: Denies blurriness of vision Ears, nose, mouth, throat, and face: Denies mucositis or sore throat Respiratory: Denies cough, dyspnea or wheezes Cardiovascular: Denies palpitation, chest discomfort or lower extremity swelling Gastrointestinal:  Denies nausea, heartburn or change in bowel habits Skin: Denies abnormal skin rashes Lymphatics: Denies new lymphadenopathy or easy bruising Neurological:Denies numbness, tingling or new weaknesses Behavioral/Psych: Mood is stable, no new changes  All other systems were reviewed with the patient and are negative.  I have reviewed the past medical history, past surgical history, social history and family history with the patient and they are unchanged from previous note.  ALLERGIES:  is allergic to tylenol [acetaminophen] and latex.  MEDICATIONS:  Current Outpatient Medications  Medication Sig Dispense Refill   Cyanocobalamin (VITAMIN B 12 PO) Take 1 tablet by mouth.     escitalopram (LEXAPRO) 10 MG tablet TAKE 1 TABLET EVERY DAY 90 tablet 3   Magnesium 200 MG TABS Take 2 tablets by mouth daily.     ondansetron (ZOFRAN) 8 MG tablet Take 1 tablet (8 mg total) by mouth every 8 (eight) hours as needed for nausea or vomiting. Start on the third day after chemotherapy. 30 tablet 1   pantoprazole (PROTONIX) 40 MG tablet TAKE 1  TABLET BY MOUTH TWICE A DAY 180 tablet 3   prochlorperazine (COMPAZINE) 10 MG tablet Take 1 tablet (10 mg total) by mouth every 6 (six) hours as needed for nausea or vomiting. 30 tablet 1   traMADol (ULTRAM) 50 MG tablet Take 1 tablet (50 mg total) by mouth every 6 (six) hours as needed. 30 tablet 0   VITAMIN D, CHOLECALCIFEROL, PO Take 1 tablet  by mouth daily.     No current facility-administered medications for this visit.   Facility-Administered Medications Ordered in Other Visits  Medication Dose Route Frequency Provider Last Rate Last Admin   heparin lock flush 100 unit/mL  500 Units Intracatheter Once PRN Bertis Ruddy, , MD       sodium chloride flush (NS) 0.9 % injection 10 mL  10 mL Intracatheter PRN Bertis Ruddy, , MD       trastuzumab-anns (KANJINTI) 420 mg in sodium chloride 0.9 % 250 mL chemo infusion  6 mg/kg (Treatment Plan Recorded) Intravenous Once Artis Delay, MD        SUMMARY OF ONCOLOGIC HISTORY: Oncology History Overview Note  MMR normal MSI stable PD-L1 CPS: 1% Her2 positive P53 + Carcinosarcoma Neg genetics ER negative   Carcinosarcoma of body of uterus (HCC)  09/23/2021 Initial Diagnosis   She presented with PMB   09/28/2021 Imaging   US pelvis Enlarged uterus with fibroids. Pelvic sonogram is otherwise unremarkable.    11/11/2021 Pathology Results   A. CERVIX, MASS, EXCISION:  - HIGH-GRADE UNDIFFERENTIATED MALIGNANCY WITH EXTENSIVE NECROSIS (SEE COMMENT).   COMMENT:  The tumor consists of mostly high-grade sarcomatoid morphology with focal epithelioid morphology.  Immunohistochemical stains are performed on block A4.  The tumor cells are positive for desmin and p16 while negative for CD45, CK7, CK20, chromogranin, synaptophysin, Melan-A, p40, AE1/AE3, SMA, p63, cyclin D1, S100 and smooth muscle myosin.  Ki-67 proliferation index is high.  CD10 staining shows focal nonspecific staining.  The differential diagnosis includes a high-grade sarcoma and a partially sampled carcinosarcoma.    11/25/2021 Imaging   MRI pelvis Marked distention of the entire endometrial cavity by heterogeneously enhancing soft tissue density, which extends through the endocervical canal into the lower vagina. This is highly suspicious for endometrial carcinoma.   Diffuse myometrial thinning due to marked distention of  endometrial cavity limits evaluation; deep myometrial invasion cannot be excluded in the uterine fundus.   No evidence of extra-uterine extension of tumor.   Lymphadenopathy in lower abdominal retroperitoneum, bilateral iliac chains, and sigmoid mesocolon, consistent with metastatic disease.   Sigmoid diverticulosis. No radiographic evidence of diverticulitis.   11/26/2021 Initial Diagnosis   Uterine cancer (HCC)   11/26/2021 Cancer Staging   Staging form: Corpus Uteri - Carcinoma and Carcinosarcoma, AJCC 8th Edition - Clinical stage from 11/26/2021: FIGO Stage IIIC2 (cT3, cN2, cM0) - Signed by Artis Delay, MD on 11/26/2021 Stage prefix: Initial diagnosis Histologic grade (G): G3 Histologic grading system: 3 grade system   11/30/2021 Pathology Results   FINAL MICROSCOPIC DIAGNOSIS:  A. UTERINE CONTENTS, BIOPSY: - HIGH-GRADE UNDIFFERENTIATED MALIGNANCY WITH EXTENSIVE NECROSIS (SEE COMMENT).  COMMENT: The patient's history of a high-grade undifferentiated malignancy with extensive necrosis of the cervix is noted.  The morphological features of the current tumor cells are similar to the tumor cells seen in the previous cervical mass excision.  Immunohistochemical staining for desmin and cytokeratin AE1/AE3 is performed on block A1.  The tumor cells are diffusely positive for desmin.   AE1/AE3 stain is focally positive, however this is scant and of  unclear clinical significance. Given the small quantity of tissue in the current biopsy, a definitive distinction between a high-grade sarcoma and carcinosarcoma cannot be made as this biopsy may not be representative of the entire tumor. If clinically indicated, this case as well as the previous cervical mass excision ([id number]) may be sent out for expert consultation. Clinical correlation recommended.     12/01/2021 Tumor Marker   Patient's tumor was tested for the following markers: CA-125. Results of the tumor marker test revealed 491.    12/22/2021 Surgery   Robotic-assisted laparoscopic total hysterectomy with bilateral salpingo-oophorectomy, tumor debulking including high common iliac lymph node excision, aborted attempt of bilateral external iliac enlarged lymph nodes, mini-lap for specimen delivery, cystoscopy    12/22/2021 Pathology Results   FINAL MICROSCOPIC DIAGNOSIS:  A. LEFT CERVICAL MARGIN, EXCISION: - Positive for carcinoma  B. HIGH LEFT COMMON ILIAC LYMPH NODE, EXCISION: - Metastatic carcinosarcoma involving the lymph node  C. 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, including undifferentiated sarcoma with rhabdoid features and high-grade serous carcinoma, see comment - Tumor invades for a depth of 1 mm where myometrial thickness is 5 mm - Benign bilateral fallopian tubes and ovaries - See oncology table - See comment  ONCOLOGY TABLE:  UTERUS, CARCINOMA OR CARCINOSARCOMA: Resection  Procedure: Total hysterectomy and bilateral salpingo-oophorectomy Histologic Type: Carcinosarcoma (mixed malignant Mullerian tumor) Histologic Grade: High-grade Myometrial Invasion:      Depth of Myometrial Invasion (mm): 1 mm      Myometrial Thickness (mm): 5 mm      Percentage of Myometrial Invasion: 20% Uterine Serosa Involvement: Not identified Cervical stromal Involvement: Present Extent of involvement of other tissue/organs: Not identified Peritoneal/Ascitic Fluid: Not applicable Lymphovascular Invasion: Present Regional Lymph Nodes:      Pelvic Lymph Nodes Examined:                                  0 Sentinel                                  1 Non-sentinel                                  1 Total      Pelvic Lymph Nodes with Metastasis: 1                          Macrometastasis: (>2.0 mm): 1                          Micrometastasis: (>0.2 mm and < 2.0 mm): 0                          Isolated Tumor Cells (<0.2 mm): 0                           Laterality of Lymph Node with Tumor: Left                          Extracapsular Extension: Not identified      Para-aortic Lymph Nodes Examined:  0 Sentinel                                   0 non-sentinel                                   0 total Distant Metastasis:      Distant Site(s) Involved: Not applicable Pathologic Stage Classification (pTNM, AJCC 8th Edition): pT1a, pN1 Ancillary Studies: MMR / MSI testing will be ordered Representative Tumor Block: C1 Comment(s): None  ([id number])       01/13/2022 Procedure   Placement of single lumen port a cath via right internal jugular vein. The catheter tip lies at the cavo-atrial junction. A power injectable port a cath was placed and is ready for immediate use.     01/19/2022 Echocardiogram    1. Left ventricular ejection fraction, by estimation, is 60 to 65%. The left ventricle has normal function. The left ventricle has no regional wall motion abnormalities. Left ventricular diastolic parameters are consistent with Grade I diastolic dysfunction (impaired relaxation). The average left ventricular global longitudinal strain is -24.0 %. The global longitudinal strain is normal.  2. Right ventricular systolic function is normal. The right ventricular size is normal. There is normal pulmonary artery systolic pressure.  3. Left atrial size was mildly dilated.  4. The mitral valve is normal in structure. No evidence of mitral valve regurgitation. No evidence of mitral stenosis.  5. The aortic valve was not well visualized. Aortic valve regurgitation is not visualized. Aortic valve sclerosis is present, with no evidence of aortic valve stenosis.  6. The inferior vena cava is normal in size with greater than 50% respiratory variability, suggesting right atrial pressure of 3 mmHg.     01/27/2022 Tumor Marker   Patient's tumor was tested for the following markers: CA-125. Results of the tumor  marker test revealed 307.   02/14/2022 -  Chemotherapy   Patient is on Treatment Plan : UTERINE SEROUS CARCINOMA Carboplatin + Paclitaxel + Trastuzumab q21d x 6 Cycles / Trastuzumab q21d     02/15/2022 Tumor Marker   Patient's tumor was tested for the following markers: CA-125. Results of the tumor marker test revealed 161.   03/29/2022 Tumor Marker   Patient's tumor was tested for the following markers: CA-125. Results of the tumor marker test revealed 30.   04/13/2022 Echocardiogram   1. Left ventricular ejection fraction, by estimation, is 60 to 65%. The left ventricle has normal function. The left ventricle has no regional wall motion abnormalities. Left ventricular diastolic parameters are consistent with Grade I diastolic dysfunction (impaired relaxation). The average left ventricular global longitudinal strain is -19.4 %. The global longitudinal strain is normal.  2. Right ventricular systolic function is normal. The right ventricular size is normal. There is normal pulmonary artery systolic pressure. The estimated right ventricular systolic pressure is 21.0 mmHg.  3. Left atrial size was mild to moderately dilated.  4. The mitral valve is grossly normal. Trivial mitral valve regurgitation.  5. The aortic valve is tricuspid. Aortic valve regurgitation is not visualized. Aortic valve sclerosis is present, with no evidence of aortic valve stenosis.  6. The inferior vena cava is normal in size with greater than 50% respiratory variability, suggesting right atrial pressure of 3 mmHg.   06/01/2022 Tumor Marker   Patient's tumor was tested for the following  markers: CA-125. Results of the tumor marker test revealed 8.   06/05/2022 Genetic Testing   Negative genetic testing on the Multi-Cancer gene panel + RNA.  The report date is Jun 05, 2022.  The Multi-Cancer + RNA Panel offered by [facility] includes sequencing and/or deletion/duplication analysis of the following 70 genes:  AIP*, ALK, APC*,  ATM*, AXIN2*, BAP1*, BARD1*, BLM*, BMPR1A*, BRCA1*, BRCA2*, BRIP1*, CDC73*, CDH1*, CDK4, CDKN1B*, CDKN2A, CHEK2*, CTNNA1*, DICER1*, EPCAM (del/dup only), EGFR, FH*, FLCN*, GREM1 (promoter dup only), HOXB13, KIT, LZTR1, MAX*, MBD4, MEN1*, MET, MITF, MLH1*, MSH2*, MSH3*, MSH6*, MUTYH*, NF1*, NF2*, NTHL1*, PALB2*, PDGFRA, PMS2*, POLD1*, POLE*, POT1*, PRKAR1A*, PTCH1*, PTEN*, RAD51C*, RAD51D*, RB1*, RET, SDHA* (sequencing only), SDHAF2*, SDHB*, SDHC*, SDHD*, SMAD4*, SMARCA4*, SMARCB1*, SMARCE1*, STK11*, SUFU*, TMEM127*, TP53*, TSC1*, TSC2*, VHL*. RNA analysis is performed for * genes.    06/20/2022 Imaging   1. Enlarged periaortic and iliac lymph nodes consistent metastatic disease. Bulky iliac node on the LEFT. 2. No evidence of peritoneal metastasis. 3. Post hysterectomy and oophorectomy.   07/13/2022 Tumor Marker   Patient's tumor was tested for the following markers: CA-125. Results of the tumor marker test revealed 8.8.   07/27/2022 Echocardiogram   ECHOCARDIOGRAM COMPLETE  Result Date: 07/27/2022    ECHOCARDIOGRAM REPORT   Patient Name:   LARIZA ZELTNER Napoles Date of Exam: 07/27/2022 Medical Rec #:  [id number]      Height:       59.5 in Accession #:    [id number]     Weight:       140.4 lb Date of Birth:  December 18, 1945      BSA:          1.596 m Patient Age:    76 years       BP:           135/92 mmHg Patient Gender: F              HR:           69 bpm. Exam Location:  Outpatient Procedure: 2D Echo, Color Doppler, Cardiac Doppler and Strain Analysis Indications:    Chemo  History:        Patient has prior history of Echocardiogram examinations, most                 recent 04/13/2022. Breast Cancer, Uterine Cancer, CKD; Risk                 Factors:Dyslipidemia.  Sonographer:    Milbert Coulter Referring Phys: [id number]    Sonographer Comments: Global longitudinal strain was attempted. IMPRESSIONS  1. Prior GLS was -19.4 on 04/13/22 suggest f/u echo in 3 months to make sure not seeing early myocardial issues . Left  ventricular ejection fraction, by estimation, is 55 to 60%. The left ventricle has normal function. The left ventricle has no regional wall motion abnormalities. Left ventricular diastolic parameters were normal. The average left ventricular global longitudinal strain is -15.9 %. The global longitudinal strain is normal.  2. Right ventricular systolic function is normal. The right ventricular size is normal.  3. The mitral valve is normal in structure. No evidence of mitral valve regurgitation. No evidence of mitral stenosis.  4. The aortic valve was not well visualized. There is mild calcification of the aortic valve. Aortic valve regurgitation is not visualized. Aortic valve sclerosis is present, with no evidence of aortic valve stenosis.  5. The inferior vena cava is normal in size with greater than 50% respiratory variability, suggesting right atrial  pressure of 3 mmHg. FINDINGS  Left Ventricle: Prior GLS was -19.4 on 04/13/22 suggest f/u echo in 3 months to make sure not seeing early myocardial issues. Left ventricular ejection fraction, by estimation, is 55 to 60%. The left ventricle has normal function. The left ventricle has no regional wall motion abnormalities. The average left ventricular global longitudinal strain is -15.9 %. The global longitudinal strain is normal. The left ventricular internal cavity size was normal in size. There is no left ventricular hypertrophy. Left ventricular diastolic parameters were normal. Right Ventricle: The right ventricular size is normal. No increase in right ventricular wall thickness. Right ventricular systolic function is normal. Left Atrium: Left atrial size was normal in size. Right Atrium: Right atrial size was normal in size. Pericardium: There is no evidence of pericardial effusion. Mitral Valve: The mitral valve is normal in structure. No evidence of mitral valve regurgitation. No evidence of mitral valve stenosis. Tricuspid Valve: The tricuspid valve is normal  in structure. Tricuspid valve regurgitation is not demonstrated. No evidence of tricuspid stenosis. Aortic Valve: The aortic valve was not well visualized. There is mild calcification of the aortic valve. Aortic valve regurgitation is not visualized. Aortic valve sclerosis is present, with no evidence of aortic valve stenosis. Aortic valve mean gradient measures 4.0 mmHg. Aortic valve peak gradient measures 8.2 mmHg. Aortic valve area, by VTI measures 1.40 cm. Pulmonic Valve: The pulmonic valve was normal in structure. Pulmonic valve regurgitation is not visualized. No evidence of pulmonic stenosis. Aorta: The aortic root is normal in size and structure. Venous: The inferior vena cava is normal in size with greater than 50% respiratory variability, suggesting right atrial pressure of 3 mmHg. IAS/Shunts: No atrial level shunt detected by color flow Doppler.  LEFT VENTRICLE PLAX 2D LVIDd:         4.00 cm   Diastology LVIDs:         2.60 cm   LV e' medial:    5.77 cm/s LV PW:         0.90 cm   LV E/e' medial:  10.8 LV IVS:        0.80 cm   LV e' lateral:   13.50 cm/s LVOT diam:     1.70 cm   LV E/e' lateral: 4.6 LV SV:         41 LV SV Index:   26        2D Longitudinal Strain LVOT Area:     2.27 cm  2D Strain GLS Avg:     -15.9 %  RIGHT VENTRICLE RV Basal diam:  2.70 cm RV Mid diam:    2.10 cm RV S prime:     12.80 cm/s TAPSE (M-mode): 2.1 cm LEFT ATRIUM             Index        RIGHT ATRIUM          Index LA diam:        2.70 cm 1.69 cm/m   RA Area:     8.87 cm LA Vol (A2C):   35.4 ml 22.18 ml/m  RA Volume:   16.30 ml 10.21 ml/m LA Vol (A4C):   36.6 ml 22.93 ml/m LA Biplane Vol: 36.1 ml 22.62 ml/m  AORTIC VALVE AV Area (Vmax):    1.38 cm AV Area (Vmean):   1.35 cm AV Area (VTI):     1.40 cm AV Vmax:           143.00 cm/s  AV Vmean:          93.400 cm/s AV VTI:            0.293 m AV Peak Grad:      8.2 mmHg AV Mean Grad:      4.0 mmHg LVOT Vmax:         87.00 cm/s LVOT Vmean:        55.700 cm/s LVOT VTI:           0.180 m LVOT/AV VTI ratio: 0.62  AORTA Ao Root diam: 2.80 cm Ao Asc diam:  3.00 cm MITRAL VALVE               TRICUSPID VALVE MV Area (PHT): 3.30 cm    TR Peak grad:   21.7 mmHg MV Decel Time: 230 msec    TR Vmax:        233.00 cm/s MV E velocity: 62.10 cm/s MV A velocity: 87.80 cm/s  SHUNTS MV E/A ratio:  0.71        Systemic VTI:  0.18 m                            Systemic Diam: 1.70 cm Charlton Haws MD Electronically signed by Charlton Haws MD Signature Date/Time: 07/27/2022/10:27:37 AM    Final         PHYSICAL EXAMINATION: ECOG PERFORMANCE STATUS: 1 - Symptomatic but completely ambulatory  Vitals:   08/22/22 0818  BP: 137/77  Pulse: 83  Resp: 18  Temp: 98 F (36.7 C)  SpO2: 99%   Filed Weights   08/22/22 0818  Weight: 144 lb (65.3 kg)    GENERAL:alert, no distress and comfortable NEURO: alert & oriented x 3 with fluent speech, no focal motor/sensory deficits  LABORATORY DATA:  I have reviewed the data as listed    Component Value Date/Time   NA 140 08/22/2022 0802   NA 140 11/02/2016 1130   K 4.0 08/22/2022 0802   K 3.8 11/02/2016 1130   CL 105 08/22/2022 0802   CL 105 05/02/2012 0822   CO2 28 08/22/2022 0802   CO2 26 11/02/2016 1130   GLUCOSE 86 08/22/2022 0802   GLUCOSE 87 11/02/2016 1130   GLUCOSE 98 05/02/2012 0822   BUN 26 (H) 08/22/2022 0802   BUN 14.7 11/02/2016 1130   CREATININE 1.32 (H) 08/22/2022 0802   CREATININE 1.05 (H) 05/30/2022 0916   CREATININE 0.84 12/05/2018 1014   CREATININE 0.8 11/02/2016 1130   CALCIUM 9.0 08/22/2022 0802   CALCIUM 9.7 11/02/2016 1130   PROT 6.9 08/22/2022 0802   PROT 6.8 11/02/2016 1130   ALBUMIN 3.9 08/22/2022 0802   ALBUMIN 3.6 11/02/2016 1130   AST 35 08/22/2022 0802   AST 21 05/30/2022 0916   AST 23 11/02/2016 1130   ALT 35 08/22/2022 0802   ALT 15 05/30/2022 0916   ALT 17 11/02/2016 1130   ALKPHOS 99 08/22/2022 0802   ALKPHOS 59 11/02/2016 1130   BILITOT 0.3 08/22/2022 0802   BILITOT 0.3 05/30/2022 0916    BILITOT 0.34 11/02/2016 1130   GFRNONAA 42 (L) 08/22/2022 0802   GFRNONAA 55 (L) 05/30/2022 0916   GFRAA >60 11/08/2017 0504   GFRAA >60 07/25/2017 1425    No results found for: "SPEP", "UPEP"  Lab Results  Component Value Date   WBC 3.7 (L) 08/22/2022   NEUTROABS 2.1 08/22/2022   HGB 11.4 (L) 08/22/2022   HCT 34.5 (L) 08/22/2022  MCV 97.7 08/22/2022   PLT 196 08/22/2022      Chemistry      Component Value Date/Time   NA 140 08/22/2022 0802   NA 140 11/02/2016 1130   K 4.0 08/22/2022 0802   K 3.8 11/02/2016 1130   CL 105 08/22/2022 0802   CL 105 05/02/2012 0822   CO2 28 08/22/2022 0802   CO2 26 11/02/2016 1130   BUN 26 (H) 08/22/2022 0802   BUN 14.7 11/02/2016 1130   CREATININE 1.32 (H) 08/22/2022 0802   CREATININE 1.05 (H) 05/30/2022 0916   CREATININE 0.84 12/05/2018 1014   CREATININE 0.8 11/02/2016 1130      Component Value Date/Time   CALCIUM 9.0 08/22/2022 0802   CALCIUM 9.7 11/02/2016 1130   ALKPHOS 99 08/22/2022 0802   ALKPHOS 59 11/02/2016 1130   AST 35 08/22/2022 0802   AST 21 05/30/2022 0916   AST 23 11/02/2016 1130   ALT 35 08/22/2022 0802   ALT 15 05/30/2022 0916   ALT 17 11/02/2016 1130   BILITOT 0.3 08/22/2022 0802   BILITOT 0.3 05/30/2022 0916   BILITOT 0.34 11/02/2016 1130

## 2022-08-22 NOTE — Assessment & Plan Note (Signed)
Her last imaging study showed persistent disease Due to high risk disease, I recommend CT imaging with a chest, abdomen and pelvis for evaluation Her previous CT chest was not normal We will proceed with treatment without delay Her recent echocardiogram was within normal limits

## 2022-08-23 ENCOUNTER — Other Ambulatory Visit: Payer: Self-pay

## 2022-09-02 ENCOUNTER — Ambulatory Visit
Admission: RE | Admit: 2022-09-02 | Discharge: 2022-09-02 | Disposition: A | Discharge status: Home / Self Care | Payer: Medicare HMO | Source: Ambulatory Visit | Attending: Hematology and Oncology | Admitting: Hematology and Oncology

## 2022-09-02 DIAGNOSIS — Z1231 Encounter for screening mammogram for malignant neoplasm of breast: Secondary | ICD-10-CM

## 2022-09-08 ENCOUNTER — Ambulatory Visit (HOSPITAL_COMMUNITY)
Admission: RE | Admit: 2022-09-08 | Discharge: 2022-09-08 | Disposition: A | Discharge status: Home / Self Care | Payer: Medicare HMO | Source: Ambulatory Visit | Attending: Hematology and Oncology | Admitting: Hematology and Oncology

## 2022-09-08 DIAGNOSIS — C55 Malignant neoplasm of uterus, part unspecified: Secondary | ICD-10-CM | POA: Diagnosis not present

## 2022-09-08 DIAGNOSIS — C549 Malignant neoplasm of corpus uteri, unspecified: Secondary | ICD-10-CM | POA: Diagnosis not present

## 2022-09-08 DIAGNOSIS — C772 Secondary and unspecified malignant neoplasm of intra-abdominal lymph nodes: Secondary | ICD-10-CM | POA: Diagnosis not present

## 2022-09-08 DIAGNOSIS — E041 Nontoxic single thyroid nodule: Secondary | ICD-10-CM | POA: Diagnosis not present

## 2022-09-08 MED ORDER — IOHEXOL 300 MG/ML  SOLN
80.0000 mL | Freq: Once | INTRAMUSCULAR | Status: AC | PRN
  Administered 2022-09-08: 80 mL via INTRAVENOUS

## 2022-09-08 MED ORDER — HEPARIN SOD (PORK) LOCK FLUSH 100 UNIT/ML IV SOLN
500.0000 [IU] | Freq: Once | INTRAVENOUS | Status: AC
  Administered 2022-09-08: 500 [IU] via INTRAVENOUS

## 2022-09-08 MED ORDER — IOHEXOL 9 MG/ML PO SOLN
ORAL | Status: AC
  Filled 2022-09-08: qty 500

## 2022-09-08 MED ORDER — HEPARIN SOD (PORK) LOCK FLUSH 100 UNIT/ML IV SOLN
INTRAVENOUS | Status: AC
  Filled 2022-09-08: qty 5

## 2022-09-08 MED ORDER — IOHEXOL 9 MG/ML PO SOLN
500.0000 mL | ORAL | Status: AC
  Administered 2022-09-08: 500 mL via ORAL

## 2022-09-13 ENCOUNTER — Other Ambulatory Visit: Payer: Self-pay | Admitting: Hematology and Oncology

## 2022-09-13 ENCOUNTER — Telehealth: Payer: Self-pay

## 2022-09-13 NOTE — Telephone Encounter (Signed)
Called her back. Told her lab and infusion canceled on 9/5. Dr. Bertis Ruddy appt will be 45 mins on 9/5. Her scan is not good, per Dr. Bertis Ruddy. She verbalized understanding.

## 2022-09-13 NOTE — Telephone Encounter (Signed)
Called and left a message asking her to call the office back. Appt changed to 45 min appt on 9/5.

## 2022-09-15 ENCOUNTER — Ambulatory Visit: Payer: Medicare HMO

## 2022-09-15 ENCOUNTER — Other Ambulatory Visit: Payer: Medicare HMO

## 2022-09-15 ENCOUNTER — Other Ambulatory Visit: Payer: Self-pay

## 2022-09-15 ENCOUNTER — Encounter: Payer: Self-pay | Admitting: Hematology and Oncology

## 2022-09-15 ENCOUNTER — Inpatient Hospital Stay: Payer: Medicare HMO | Attending: Hematology and Oncology | Admitting: Hematology and Oncology

## 2022-09-15 ENCOUNTER — Ambulatory Visit: Payer: Medicare HMO | Admitting: Hematology and Oncology

## 2022-09-15 VITALS — BP 149/74 | HR 78 | Temp 98.3°F | Resp 18 | Ht 59.5 in | Wt 144.8 lb

## 2022-09-15 DIAGNOSIS — C549 Malignant neoplasm of corpus uteri, unspecified: Secondary | ICD-10-CM

## 2022-09-15 DIAGNOSIS — C778 Secondary and unspecified malignant neoplasm of lymph nodes of multiple regions: Secondary | ICD-10-CM | POA: Insufficient documentation

## 2022-09-15 DIAGNOSIS — F411 Generalized anxiety disorder: Secondary | ICD-10-CM

## 2022-09-15 DIAGNOSIS — M8580 Other specified disorders of bone density and structure, unspecified site: Secondary | ICD-10-CM | POA: Diagnosis not present

## 2022-09-15 DIAGNOSIS — Z90722 Acquired absence of ovaries, bilateral: Secondary | ICD-10-CM | POA: Diagnosis not present

## 2022-09-15 DIAGNOSIS — Z7189 Other specified counseling: Secondary | ICD-10-CM | POA: Insufficient documentation

## 2022-09-15 DIAGNOSIS — F419 Anxiety disorder, unspecified: Secondary | ICD-10-CM | POA: Insufficient documentation

## 2022-09-15 DIAGNOSIS — Z90710 Acquired absence of both cervix and uterus: Secondary | ICD-10-CM | POA: Insufficient documentation

## 2022-09-15 DIAGNOSIS — C541 Malignant neoplasm of endometrium: Secondary | ICD-10-CM | POA: Diagnosis not present

## 2022-09-15 DIAGNOSIS — Z5112 Encounter for antineoplastic immunotherapy: Secondary | ICD-10-CM | POA: Diagnosis not present

## 2022-09-15 MED ORDER — ESCITALOPRAM OXALATE 20 MG PO TABS
20.0000 mg | ORAL_TABLET | Freq: Every day | ORAL | 0 refills | Status: DC
Start: 2022-09-15 — End: 2023-03-21

## 2022-09-15 NOTE — Assessment & Plan Note (Signed)
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

## 2022-09-15 NOTE — Assessment & Plan Note (Signed)
I have reviewed multiple imaging studies with the patient and her daughter Unfortunately, she has disease progression I recommend consideration for resuming chemotherapy with carboplatin with paclitaxel, combination of carboplatin, paclitaxel with bevacizumab versus Enhertu We discussed risk, benefits, side effects of each option I explained to the patient the rationale why surgery is not indicated due to proximity of multiple lymph nodes near major blood vessels but certainly I could arrange for her to be seen by GYN oncologist if she desire surgical opinion.  I also explained to the patient why radiation therapy is not indicated At the end of the day, she is undecided She will discuss each option with family and get back to me next week

## 2022-09-15 NOTE — Assessment & Plan Note (Signed)
We have goals of care discussion She is aware that her disease cannot be cured and systemic chemotherapy may prolong her life We discussed prognosis with and without treatment

## 2022-09-15 NOTE — Progress Notes (Signed)
[facility] OFFICE PROGRESS NOTE  Patient Care Team: Carver Fila, MD as PCP - General (Gynecologic Oncology) Hannah Beat, MD as Consulting Physician [facility] Medicine)  ASSESSMENT & PLAN:  Carcinosarcoma of body of uterus [facility]) I have reviewed multiple imaging studies with the patient and her daughter Unfortunately, she has disease progression I recommend consideration for resuming chemotherapy with carboplatin with paclitaxel, combination of carboplatin, paclitaxel with bevacizumab versus Enhertu We discussed risk, benefits, side effects of each option I explained to the patient the rationale why surgery is not indicated due to proximity of multiple lymph nodes near major blood vessels but certainly I could arrange for her to be seen by GYN oncologist if she desire surgical opinion.  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   All questions were answered. The patient knows to call the clinic with any problems, questions or concerns. The total time spent in the appointment was 40 minutes encounter with patients including review of chart and various tests results, discussions about plan of care and coordination of care plan   Artis Delay, MD 09/15/2022 2:07 PM  INTERVAL HISTORY: Please see below for problem oriented  charting. she returns for treatment follow-up We discussed and reviewed imaging studies and discussed risk and benefits of switching her to a different treatment  REVIEW OF SYSTEMS:   Constitutional: Denies fevers, chills or abnormal weight loss Eyes: Denies blurriness of vision Ears, nose, mouth, throat, and face: Denies mucositis or sore throat Respiratory: Denies cough, dyspnea or wheezes Cardiovascular: Denies palpitation, chest discomfort or lower extremity swelling Gastrointestinal:  Denies nausea, heartburn or change in bowel habits Skin: Denies abnormal skin rashes Lymphatics: Denies new lymphadenopathy or easy bruising Neurological:Denies numbness, tingling or new weaknesses Behavioral/Psych: Mood is stable, no new changes  All other systems were reviewed with the patient and are negative.  I have reviewed the past medical history, past surgical history, social history and family history with the patient and they are unchanged from previous note.  ALLERGIES:  is allergic to tylenol [acetaminophen] and latex.  MEDICATIONS:  Current Outpatient Medications  Medication Sig Dispense Refill   Cyanocobalamin (VITAMIN B 12 PO) Take 1 tablet by mouth.     escitalopram (LEXAPRO) 20 MG tablet Take 1 tablet (20 mg total) by mouth daily. 90 tablet 0   Magnesium 200 MG TABS Take 2 tablets by mouth daily.     ondansetron (ZOFRAN) 8 MG tablet Take 1 tablet (8 mg total) by mouth every 8 (eight) hours as needed for nausea or vomiting. Start on the third day after chemotherapy. 30 tablet 1   pantoprazole (PROTONIX) 40 MG tablet TAKE 1 TABLET BY MOUTH TWICE A DAY 180 tablet 3   prochlorperazine (COMPAZINE) 10 MG tablet Take 1 tablet (10 mg total) by mouth every 6 (six) hours as needed for nausea  or vomiting. 30 tablet 1   traMADol (ULTRAM) 50 MG tablet Take 1 tablet (50 mg total) by mouth every 6 (six) hours as needed. 30 tablet 0   VITAMIN D, CHOLECALCIFEROL, PO Take 1 tablet by mouth daily.      No current facility-administered medications for this visit.    SUMMARY OF ONCOLOGIC HISTORY: Oncology History Overview Note  MMR normal MSI stable PD-L1 CPS: 1% Her2 positive P53 + Carcinosarcoma Neg genetics ER negative   Carcinosarcoma of body of uterus (HCC)  09/23/2021 Initial Diagnosis   She presented with PMB   09/28/2021 Imaging   US pelvis Enlarged uterus with fibroids. Pelvic sonogram is otherwise unremarkable.    11/11/2021 Pathology Results   A. CERVIX, MASS, EXCISION:  - HIGH-GRADE UNDIFFERENTIATED MALIGNANCY WITH EXTENSIVE NECROSIS (SEE COMMENT).   COMMENT:  The tumor consists of mostly high-grade sarcomatoid morphology with focal epithelioid morphology.  Immunohistochemical stains are performed on block A4.  The tumor cells are positive for desmin and p16 while negative for CD45, CK7, CK20, chromogranin, synaptophysin, Melan-A, p40, AE1/AE3, SMA, p63, cyclin D1, S100 and smooth muscle myosin.  Ki-67 proliferation index is high.  CD10 staining shows focal nonspecific staining.  The differential diagnosis includes a high-grade sarcoma and a partially sampled carcinosarcoma.    11/25/2021 Imaging   MRI pelvis Marked distention of the entire endometrial cavity by heterogeneously enhancing soft tissue density, which extends through the endocervical canal into the lower vagina. This is highly suspicious for endometrial carcinoma.   Diffuse myometrial thinning due to marked distention of endometrial cavity limits evaluation; deep myometrial invasion cannot be excluded in the uterine fundus.   No evidence of extra-uterine extension of tumor.   Lymphadenopathy in lower abdominal retroperitoneum, bilateral iliac chains, and sigmoid mesocolon, consistent with metastatic disease.   Sigmoid diverticulosis. No radiographic evidence of diverticulitis.   11/26/2021 Initial Diagnosis   Uterine cancer (HCC)   11/26/2021 Cancer Staging   Staging form: Corpus Uteri -  Carcinoma and Carcinosarcoma, AJCC 8th Edition - Clinical stage from 11/26/2021: FIGO Stage IIIC2 (cT3, cN2, cM0) - Signed by Artis Delay, MD on 11/26/2021 Stage prefix: Initial diagnosis Histologic grade (G): G3 Histologic grading system: 3 grade system   11/30/2021 Pathology Results   FINAL MICROSCOPIC DIAGNOSIS:  A. UTERINE CONTENTS, BIOPSY: - HIGH-GRADE UNDIFFERENTIATED MALIGNANCY WITH EXTENSIVE NECROSIS (SEE COMMENT).  COMMENT: The patient's history of a high-grade undifferentiated malignancy with extensive necrosis of the cervix is noted.  The morphological features of the current tumor cells are similar to the tumor cells seen in the previous cervical mass excision.  Immunohistochemical staining for desmin and cytokeratin AE1/AE3 is performed on block A1.  The tumor cells are diffusely positive for desmin.   AE1/AE3 stain is focally positive, however this is scant and of unclear clinical significance. Given the small quantity of tissue in the current biopsy, a definitive distinction between a high-grade sarcoma and carcinosarcoma cannot be made as this biopsy may not be representative of the entire tumor. If clinically indicated, this case as well as the previous cervical mass excision ([id number]) may be sent out for expert consultation. Clinical correlation recommended.     12/01/2021 Tumor Marker   Patient's tumor was tested for the following markers: CA-125. Results of the tumor marker test revealed 491.   12/22/2021 Surgery   Robotic-assisted laparoscopic total hysterectomy with bilateral salpingo-oophorectomy, tumor debulking including high common iliac lymph node excision, aborted attempt of bilateral external iliac enlarged lymph nodes, mini-lap for specimen delivery, cystoscopy  12/22/2021 Pathology Results   FINAL MICROSCOPIC DIAGNOSIS:  A. LEFT CERVICAL MARGIN, EXCISION: - Positive for carcinoma  B. HIGH LEFT COMMON ILIAC LYMPH NODE, EXCISION: - Metastatic  carcinosarcoma involving the lymph node  C. 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, including undifferentiated sarcoma with rhabdoid features and high-grade serous carcinoma, see comment - Tumor invades for a depth of 1 mm where myometrial thickness is 5 mm - Benign bilateral fallopian tubes and ovaries - See oncology table - See comment  ONCOLOGY TABLE:  UTERUS, CARCINOMA OR CARCINOSARCOMA: Resection  Procedure: Total hysterectomy and bilateral salpingo-oophorectomy Histologic Type: Carcinosarcoma (mixed malignant Mullerian tumor) Histologic Grade: High-grade Myometrial Invasion:      Depth of Myometrial Invasion (mm): 1 mm      Myometrial Thickness (mm): 5 mm      Percentage of Myometrial Invasion: 20% Uterine Serosa Involvement: Not identified Cervical stromal Involvement: Present Extent of involvement of other tissue/organs: Not identified Peritoneal/Ascitic Fluid: Not applicable Lymphovascular Invasion: Present Regional Lymph Nodes:      Pelvic Lymph Nodes Examined:                                  0 Sentinel                                  1 Non-sentinel                                  1 Total      Pelvic Lymph Nodes with Metastasis: 1                          Macrometastasis: (>2.0 mm): 1                          Micrometastasis: (>0.2 mm and < 2.0 mm): 0                          Isolated Tumor Cells (<0.2 mm): 0                          Laterality of Lymph Node with Tumor: Left                          Extracapsular Extension: Not identified      Para-aortic Lymph Nodes Examined:                                   0 Sentinel                                   0 non-sentinel                                   0 total Distant Metastasis:      Distant Site(s) Involved: Not applicable Pathologic Stage Classification (pTNM, AJCC 8th Edition): pT1a, pN1 Ancillary Studies:  MMR / MSI  testing will be ordered Representative Tumor Block: C1 Comment(s): None  ([id number])       01/13/2022 Procedure   Placement of single lumen port a cath via right internal jugular vein. The catheter tip lies at the cavo-atrial junction. A power injectable port a cath was placed and is ready for immediate use.     01/19/2022 Echocardiogram    1. Left ventricular ejection fraction, by estimation, is 60 to 65%. The left ventricle has normal function. The left ventricle has no regional wall motion abnormalities. Left ventricular diastolic parameters are consistent with Grade I diastolic dysfunction (impaired relaxation). The average left ventricular global longitudinal strain is -24.0 %. The global longitudinal strain is normal.  2. Right ventricular systolic function is normal. The right ventricular size is normal. There is normal pulmonary artery systolic pressure.  3. Left atrial size was mildly dilated.  4. The mitral valve is normal in structure. No evidence of mitral valve regurgitation. No evidence of mitral stenosis.  5. The aortic valve was not well visualized. Aortic valve regurgitation is not visualized. Aortic valve sclerosis is present, with no evidence of aortic valve stenosis.  6. The inferior vena cava is normal in size with greater than 50% respiratory variability, suggesting right atrial pressure of 3 mmHg.     01/27/2022 Tumor Marker   Patient's tumor was tested for the following markers: CA-125. Results of the tumor marker test revealed 307.   02/14/2022 - 08/22/2022 Chemotherapy   Patient is on Treatment Plan : UTERINE SEROUS CARCINOMA Carboplatin + Paclitaxel + Trastuzumab q21d x 6 Cycles / Trastuzumab q21d     02/15/2022 Tumor Marker   Patient's tumor was tested for the following markers: CA-125. Results of the tumor marker test revealed 161.   03/29/2022 Tumor Marker   Patient's tumor was tested for the following markers: CA-125. Results of the tumor marker test revealed  30.   04/13/2022 Echocardiogram   1. Left ventricular ejection fraction, by estimation, is 60 to 65%. The left ventricle has normal function. The left ventricle has no regional wall motion abnormalities. Left ventricular diastolic parameters are consistent with Grade I diastolic dysfunction (impaired relaxation). The average left ventricular global longitudinal strain is -19.4 %. The global longitudinal strain is normal.  2. Right ventricular systolic function is normal. The right ventricular size is normal. There is normal pulmonary artery systolic pressure. The estimated right ventricular systolic pressure is 21.0 mmHg.  3. Left atrial size was mild to moderately dilated.  4. The mitral valve is grossly normal. Trivial mitral valve regurgitation.  5. The aortic valve is tricuspid. Aortic valve regurgitation is not visualized. Aortic valve sclerosis is present, with no evidence of aortic valve stenosis.  6. The inferior vena cava is normal in size with greater than 50% respiratory variability, suggesting right atrial pressure of 3 mmHg.   06/01/2022 Tumor Marker   Patient's tumor was tested for the following markers: CA-125. Results of the tumor marker test revealed 8.   06/05/2022 Genetic Testing   Negative genetic testing on the Multi-Cancer gene panel + RNA.  The report date is Jun 05, 2022.  The Multi-Cancer + RNA Panel offered by [facility] includes sequencing and/or deletion/duplication analysis of the following 70 genes:  AIP*, ALK, APC*, ATM*, AXIN2*, BAP1*, BARD1*, BLM*, BMPR1A*, BRCA1*, BRCA2*, BRIP1*, CDC73*, CDH1*, CDK4, CDKN1B*, CDKN2A, CHEK2*, CTNNA1*, DICER1*, EPCAM (del/dup only), EGFR, FH*, FLCN*, GREM1 (promoter dup only), HOXB13, KIT, LZTR1, MAX*, MBD4, MEN1*, MET, MITF, MLH1*, MSH2*, MSH3*,  MSH6*, MUTYH*, NF1*, NF2*, NTHL1*, PALB2*, PDGFRA, PMS2*, POLD1*, POLE*, POT1*, PRKAR1A*, PTCH1*, PTEN*, RAD51C*, RAD51D*, RB1*, RET, SDHA* (sequencing only), SDHAF2*, SDHB*, SDHC*, SDHD*, SMAD4*,  SMARCA4*, SMARCB1*, SMARCE1*, STK11*, SUFU*, TMEM127*, TP53*, TSC1*, TSC2*, VHL*. RNA analysis is performed for * genes.    06/20/2022 Imaging   1. Enlarged periaortic and iliac lymph nodes consistent metastatic disease. Bulky iliac node on the LEFT. 2. No evidence of peritoneal metastasis. 3. Post hysterectomy and oophorectomy.   07/13/2022 Tumor Marker   Patient's tumor was tested for the following markers: CA-125. Results of the tumor marker test revealed 8.8.   07/27/2022 Echocardiogram   ECHOCARDIOGRAM COMPLETE  Result Date: 07/27/2022    ECHOCARDIOGRAM REPORT   Patient Name:   MELIDA TOLLER [place]mond Date of Exam: 07/27/2022 Medical Rec #:  [id number]      Height:       59.5 in Accession #:    [id number]     Weight:       140.4 lb Date of Birth:  1945/12/05      BSA:          1.596 m Patient Age:    76 years       BP:           135/92 mmHg Patient Gender: F              HR:           69 bpm. Exam Location:  Outpatient Procedure: 2D Echo, Color Doppler, Cardiac Doppler and Strain Analysis Indications:    Chemo  History:        Patient has prior history of Echocardiogram examinations, most                 recent 04/13/2022. Breast Cancer, Uterine Cancer, CKD; Risk                 Factors:Dyslipidemia.  Sonographer:    Milbert Coulter Referring Phys: [id number] Warren Kugelman  Sonographer Comments: Global longitudinal strain was attempted. IMPRESSIONS  1. Prior GLS was -19.4 on 04/13/22 suggest f/u echo in 3 months to make sure not seeing early myocardial issues . Left ventricular ejection fraction, by estimation, is 55 to 60%. The left ventricle has normal function. The left ventricle has no regional wall motion abnormalities. Left ventricular diastolic parameters were normal. The average left ventricular global longitudinal strain is -15.9 %. The global longitudinal strain is normal.  2. Right ventricular systolic function is normal. The right ventricular size is normal.  3. The mitral valve is normal in structure. No  evidence of mitral valve regurgitation. No evidence of mitral stenosis.  4. The aortic valve was not well visualized. There is mild calcification of the aortic valve. Aortic valve regurgitation is not visualized. Aortic valve sclerosis is present, with no evidence of aortic valve stenosis.  5. The inferior vena cava is normal in size with greater than 50% respiratory variability, suggesting right atrial pressure of 3 mmHg. FINDINGS  Left Ventricle: Prior GLS was -19.4 on 04/13/22 suggest f/u echo in 3 months to make sure not seeing early myocardial issues. Left ventricular ejection fraction, by estimation, is 55 to 60%. The left ventricle has normal function. The left ventricle has no regional wall motion abnormalities. The average left ventricular global longitudinal strain is -15.9 %. The global longitudinal strain is normal. The left ventricular internal cavity size was normal in size. There is no left ventricular hypertrophy. Left ventricular diastolic parameters were normal. Right Ventricle: The right ventricular  size is normal. No increase in right ventricular wall thickness. Right ventricular systolic function is normal. Left Atrium: Left atrial size was normal in size. Right Atrium: Right atrial size was normal in size. Pericardium: There is no evidence of pericardial effusion. Mitral Valve: The mitral valve is normal in structure. No evidence of mitral valve regurgitation. No evidence of mitral valve stenosis. Tricuspid Valve: The tricuspid valve is normal in structure. Tricuspid valve regurgitation is not demonstrated. No evidence of tricuspid stenosis. Aortic Valve: The aortic valve was not well visualized. There is mild calcification of the aortic valve. Aortic valve regurgitation is not visualized. Aortic valve sclerosis is present, with no evidence of aortic valve stenosis. Aortic valve mean gradient measures 4.0 mmHg. Aortic valve peak gradient measures 8.2 mmHg. Aortic valve area, by VTI measures 1.40  cm. Pulmonic Valve: The pulmonic valve was normal in structure. Pulmonic valve regurgitation is not visualized. No evidence of pulmonic stenosis. Aorta: The aortic root is normal in size and structure. Venous: The inferior vena cava is normal in size with greater than 50% respiratory variability, suggesting right atrial pressure of 3 mmHg. IAS/Shunts: No atrial level shunt detected by color flow Doppler.  LEFT VENTRICLE PLAX 2D LVIDd:         4.00 cm   Diastology LVIDs:         2.60 cm   LV e' medial:    5.77 cm/s LV PW:         0.90 cm   LV E/e' medial:  10.8 LV IVS:        0.80 cm   LV e' lateral:   13.50 cm/s LVOT diam:     1.70 cm   LV E/e' lateral: 4.6 LV SV:         41 LV SV Index:   26        2D Longitudinal Strain LVOT Area:     2.27 cm  2D Strain GLS Avg:     -15.9 %  RIGHT VENTRICLE RV Basal diam:  2.70 cm RV Mid diam:    2.10 cm RV S prime:     12.80 cm/s TAPSE (M-mode): 2.1 cm LEFT ATRIUM             Index        RIGHT ATRIUM          Index LA diam:        2.70 cm 1.69 cm/m   RA Area:     8.87 cm LA Vol (A2C):   35.4 ml 22.18 ml/m  RA Volume:   16.30 ml 10.21 ml/m LA Vol (A4C):   36.6 ml 22.93 ml/m LA Biplane Vol: 36.1 ml 22.62 ml/m  AORTIC VALVE AV Area (Vmax):    1.38 cm AV Area (Vmean):   1.35 cm AV Area (VTI):     1.40 cm AV Vmax:           143.00 cm/s AV Vmean:          93.400 cm/s AV VTI:            0.293 m AV Peak Grad:      8.2 mmHg AV Mean Grad:      4.0 mmHg LVOT Vmax:         87.00 cm/s LVOT Vmean:        55.700 cm/s LVOT VTI:          0.180 m LVOT/AV VTI ratio: 0.62  AORTA Ao Root diam: 2.80 cm Ao  Asc diam:  3.00 cm MITRAL VALVE               TRICUSPID VALVE MV Area (PHT): 3.30 cm    TR Peak grad:   21.7 mmHg MV Decel Time: 230 msec    TR Vmax:        233.00 cm/s MV E velocity: 62.10 cm/s MV A velocity: 87.80 cm/s  SHUNTS MV E/A ratio:  0.71        Systemic VTI:  0.18 m                            Systemic Diam: 1.70 cm Charlton Haws MD Electronically signed by Charlton Haws MD  Signature Date/Time: 07/27/2022/10:27:37 AM    Final       08/23/2022 Tumor Marker   Patient's tumor was tested for the following markers: CA-125. Results of the tumor marker test revealed 7.2.   09/11/2022 Imaging   CT CHEST ABDOMEN PELVIS W CONTRAST  Result Date: 09/12/2022 CLINICAL DATA:  evaluate treatment response of uterine cancer/carcinosarcoma of uterus/endometrium. * Tracking Code: BO * EXAM: CT CHEST, ABDOMEN, AND PELVIS WITH CONTRAST TECHNIQUE: Multidetector CT imaging of the chest, abdomen and pelvis was performed following the standard protocol during bolus administration of intravenous contrast. RADIATION DOSE REDUCTION: This exam was performed according to the departmental dose-optimization program which includes automated exposure control, adjustment of the mA and/or kV according to patient size and/or use of iterative reconstruction technique. CONTRAST:  80mL OMNIPAQUE IOHEXOL 300 MG/ML  SOLN COMPARISON:  06/13/2022 abdominopelvic CT. Most recent chest CT 12/01/2021 FINDINGS: CT CHEST FINDINGS Cardiovascular: right Port-A-Cath terminates at the high right atrium. Aortic atherosclerosis. Normal heart size, without pericardial effusion. Lad coronary artery calcification. No central pulmonary embolism, on this non-dedicated study. Mediastinum/Nodes: 1.4 cm right thyroid nodule. Not clinically significant; no follow-up imaging recommended (ref: J Am Coll Radiol. 2015 Feb;12(2): 143-50). No supraclavicular adenopathy. No axillary adenopathy. Right axillary node dissection. No mediastinal or hilar adenopathy. Esophageal fluid level on 23/2. Lungs/Pleura: No pleural fluid. Posterior central left upper lobe 7 x 6 mm nodule on 42/6 is similar to on image 46 of the prior. Musculoskeletal: Right breast implant. No acute osseous abnormality. CT ABDOMEN PELVIS FINDINGS Hepatobiliary: There may be a tiny cyst in segment 4A anteriorly. Normal gallbladder, without biliary ductal dilatation. Pancreas: Normal,  without mass or ductal dilatation. Spleen: Normal in size, without focal abnormality. Adrenals/Urinary Tract: Normal adrenal glands. Normal kidneys, without hydronephrosis. Normal urinary bladder. Stomach/Bowel: Apparent proximal gastric wall thickening including on 43/2 is most likely due to underdistention. Normal distal stomach. Extensive colonic diverticulosis. Normal terminal ileum. Normal small bowel. Vascular/Lymphatic: Aortic atherosclerosis. Index left periaortic node measures 1.2 x 1.7 cm on 71/2 versus 1.3 x 1.7 cm on the prior. A preaortic/pre caval 12 mm node on 72/2 is enlarged from 7 mm on the prior exam (when remeasured). More cephalad left periaortic node measures 1.0 cm on 60/2 versus 1.1 cm on the prior. Index right common iliac node measures 9 mm on 82/2 versus 13 mm on the prior. Right external iliac centrally necrotic node measures 1.4 cm on 95/2 versus 1.2 cm on the prior. Dominant left external iliac nodal mass measures 3.1 x 3.4 cm on 92/2 versus 2.3 x 2.8 cm on the prior exam Reproductive: Hysterectomy.  No adnexal mass. Other: No significant free fluid. No evidence of omental or peritoneal disease. Musculoskeletal: Osteopenia. S shaped thoracolumbar spine curvature. IMPRESSION: CT CHEST  IMPRESSION 1. 7 x 6 mm left upper lobe pulmonary nodule is similar compared to 12/01/2021. Stability favors a benign etiology. Recommend attention on follow-up to exclude unlikely indolent primary neoplasm or isolated metastasis. 2. No thoracic adenopathy. 3. Coronary artery atherosclerosis. Aortic Atherosclerosis (ICD10-I70.0). 4. Esophageal air fluid level suggests dysmotility or gastroesophageal reflux. CT ABDOMEN AND PELVIS IMPRESSION 1. Mixed response, with overall progression of nodal metastasis. Although some nodes are smaller, the dominant left pelvic sidewall nodal mass has significantly enlarged. 2. No new sites of metastatic disease identified. Electronically Signed   By: Jeronimo Greaves M.D.   On:  09/12/2022 11:07   MM 3D SCREENING MAMMOGRAM UNILATERAL LEFT BREAST  Result Date: 09/05/2022 CLINICAL DATA:  Screening. EXAM: DIGITAL SCREENING UNILATERAL LEFT MAMMOGRAM WITH CAD AND TOMOSYNTHESIS TECHNIQUE: Left screening digital craniocaudal and mediolateral oblique mammograms were obtained. Left screening digital breast tomosynthesis was performed. The images were evaluated with computer-aided detection. COMPARISON:  Previous exam(s). ACR Breast Density Category c: The breasts are heterogeneously dense, which may obscure small masses. FINDINGS: The patient has had a right mastectomy. There are no findings suspicious for malignancy. IMPRESSION: No mammographic evidence of malignancy. A result letter of this screening mammogram will be mailed directly to the patient. RECOMMENDATION: Screening mammogram in one year.  (Code:SM-[id number]) BI-RADS CATEGORY  1: Negative. Electronically Signed   By: Ted Mcalpine M.D.   On: 09/05/2022 11:09        PHYSICAL EXAMINATION: ECOG PERFORMANCE STATUS: 1 - Symptomatic but completely ambulatory  Vitals:   09/15/22 1136  BP: (!) 149/74  Pulse: 78  Resp: 18  Temp: 98.3 F (36.8 C)  SpO2: 99%   Filed Weights   09/15/22 1136  Weight: 144 lb 12.8 oz (65.7 kg)    GENERAL:alert, no distress and comfortable  LABORATORY DATA:  I have reviewed the data as listed    Component Value Date/Time   NA 140 08/22/2022 0802   NA 140 11/02/2016 1130   K 4.0 08/22/2022 0802   K 3.8 11/02/2016 1130   CL 105 08/22/2022 0802   CL 105 05/02/2012 0822   CO2 28 08/22/2022 0802   CO2 26 11/02/2016 1130   GLUCOSE 86 08/22/2022 0802   GLUCOSE 87 11/02/2016 1130   GLUCOSE 98 05/02/2012 0822   BUN 26 (H) 08/22/2022 0802   BUN 14.7 11/02/2016 1130   CREATININE 1.32 (H) 08/22/2022 0802   CREATININE 1.05 (H) 05/30/2022 0916   CREATININE 0.84 12/05/2018 1014   CREATININE 0.8 11/02/2016 1130   CALCIUM 9.0 08/22/2022 0802   CALCIUM 9.7 11/02/2016 1130   PROT 6.9  08/22/2022 0802   PROT 6.8 11/02/2016 1130   ALBUMIN 3.9 08/22/2022 0802   ALBUMIN 3.6 11/02/2016 1130   AST 35 08/22/2022 0802   AST 21 05/30/2022 0916   AST 23 11/02/2016 1130   ALT 35 08/22/2022 0802   ALT 15 05/30/2022 0916   ALT 17 11/02/2016 1130   ALKPHOS 99 08/22/2022 0802   ALKPHOS 59 11/02/2016 1130   BILITOT 0.3 08/22/2022 0802   BILITOT 0.3 05/30/2022 0916   BILITOT 0.34 11/02/2016 1130   GFRNONAA 42 (L) 08/22/2022 0802   GFRNONAA 55 (L) 05/30/2022 0916   GFRAA >60 11/08/2017 0504   GFRAA >60 07/25/2017 1425    No results found for: "SPEP", "UPEP"  Lab Results  Component Value Date   WBC 3.7 (L) 08/22/2022   NEUTROABS 2.1 08/22/2022   HGB 11.4 (L) 08/22/2022   HCT 34.5 (L) 08/22/2022   MCV  97.7 08/22/2022   PLT 196 08/22/2022      Chemistry      Component Value Date/Time   NA 140 08/22/2022 0802   NA 140 11/02/2016 1130   K 4.0 08/22/2022 0802   K 3.8 11/02/2016 1130   CL 105 08/22/2022 0802   CL 105 05/02/2012 0822   CO2 28 08/22/2022 0802   CO2 26 11/02/2016 1130   BUN 26 (H) 08/22/2022 0802   BUN 14.7 11/02/2016 1130   CREATININE 1.32 (H) 08/22/2022 0802   CREATININE 1.05 (H) 05/30/2022 0916   CREATININE 0.84 12/05/2018 1014   CREATININE 0.8 11/02/2016 1130      Component Value Date/Time   CALCIUM 9.0 08/22/2022 0802   CALCIUM 9.7 11/02/2016 1130   ALKPHOS 99 08/22/2022 0802   ALKPHOS 59 11/02/2016 1130   AST 35 08/22/2022 0802   AST 21 05/30/2022 0916   AST 23 11/02/2016 1130   ALT 35 08/22/2022 0802   ALT 15 05/30/2022 0916   ALT 17 11/02/2016 1130   BILITOT 0.3 08/22/2022 0802   BILITOT 0.3 05/30/2022 0916   BILITOT 0.34 11/02/2016 1130       RADIOGRAPHIC STUDIES: I have reviewed multiple imaging studies with the patient and family I have personally reviewed the radiological images as listed and agreed with the findings in the report. 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 * Tracking Code: BO * EXAM: CT CHEST, ABDOMEN, AND PELVIS WITH CONTRAST TECHNIQUE: Multidetector CT imaging of the chest, abdomen and pelvis was performed following the standard protocol during bolus administration of intravenous contrast. RADIATION DOSE REDUCTION: This exam was performed according to the departmental dose-optimization program which includes automated exposure control, adjustment of the mA and/or kV according to patient size and/or use of iterative reconstruction technique. CONTRAST:  80mL OMNIPAQUE IOHEXOL 300 MG/ML  SOLN COMPARISON:  06/13/2022 abdominopelvic CT. Most recent chest CT 12/01/2021 FINDINGS: CT CHEST FINDINGS Cardiovascular: right Port-A-Cath terminates at the high right atrium. Aortic atherosclerosis. Normal heart size, without pericardial effusion. Lad coronary artery calcification. No central pulmonary embolism, on this non-dedicated study. Mediastinum/Nodes: 1.4 cm right thyroid nodule. Not clinically significant; no follow-up imaging recommended (ref: J Am Coll Radiol. 2015 Feb;12(2): 143-50). No supraclavicular adenopathy. No axillary adenopathy. Right axillary node dissection. No mediastinal or hilar adenopathy. Esophageal fluid level on 23/2. Lungs/Pleura: No pleural fluid. Posterior central left upper lobe 7 x 6 mm nodule on 42/6 is similar to on image 46 of the prior. Musculoskeletal: Right breast implant. No acute osseous abnormality. CT ABDOMEN PELVIS FINDINGS Hepatobiliary: There may be a tiny cyst in segment 4A anteriorly. Normal gallbladder, without biliary ductal dilatation. Pancreas: Normal, without mass or ductal dilatation. Spleen: Normal in size, without focal abnormality. Adrenals/Urinary Tract: Normal adrenal glands. Normal kidneys, without hydronephrosis. Normal urinary bladder. Stomach/Bowel: Apparent proximal gastric wall thickening including on 43/2 is most likely due to underdistention. Normal  distal stomach. Extensive colonic diverticulosis. Normal terminal ileum. Normal small bowel. Vascular/Lymphatic: Aortic atherosclerosis. Index left periaortic node measures 1.2 x 1.7 cm on 71/2 versus 1.3 x 1.7 cm on the prior. A preaortic/pre caval 12 mm node on 72/2 is enlarged from 7 mm on the prior exam (when remeasured). More cephalad left periaortic node measures 1.0 cm on 60/2 versus 1.1 cm on the prior. Index right common iliac node measures 9 mm on 82/2 versus 13 mm on the prior. Right external iliac centrally necrotic  node measures 1.4 cm on 95/2 versus 1.2 cm on the prior. Dominant left external iliac nodal mass measures 3.1 x 3.4 cm on 92/2 versus 2.3 x 2.8 cm on the prior exam Reproductive: Hysterectomy.  No adnexal mass. Other: No significant free fluid. No evidence of omental or peritoneal disease. Musculoskeletal: Osteopenia. S shaped thoracolumbar spine curvature. IMPRESSION: CT CHEST IMPRESSION 1. 7 x 6 mm left upper lobe pulmonary nodule is similar compared to 12/01/2021. Stability favors a benign etiology. Recommend attention on follow-up to exclude unlikely indolent primary neoplasm or isolated metastasis. 2. No thoracic adenopathy. 3. Coronary artery atherosclerosis. Aortic Atherosclerosis (ICD10-I70.0). 4. Esophageal air fluid level suggests dysmotility or gastroesophageal reflux. CT ABDOMEN AND PELVIS IMPRESSION 1. Mixed response, with overall progression of nodal metastasis. Although some nodes are smaller, the dominant left pelvic sidewall nodal mass has significantly enlarged. 2. No new sites of metastatic disease identified. Electronically Signed   By: Jeronimo Greaves M.D.   On: 09/12/2022 11:07   MM 3D SCREENING MAMMOGRAM UNILATERAL LEFT BREAST  Result Date: 09/05/2022 CLINICAL DATA:  Screening. EXAM: DIGITAL SCREENING UNILATERAL LEFT MAMMOGRAM WITH CAD AND TOMOSYNTHESIS TECHNIQUE: Left screening digital craniocaudal and mediolateral oblique mammograms were obtained. Left screening  digital breast tomosynthesis was performed. The images were evaluated with computer-aided detection. COMPARISON:  Previous exam(s). ACR Breast Density Category c: The breasts are heterogeneously dense, which may obscure small masses. FINDINGS: The patient has had a right mastectomy. There are no findings suspicious for malignancy. IMPRESSION: No mammographic evidence of malignancy. A result letter of this screening mammogram will be mailed directly to the patient. RECOMMENDATION: Screening mammogram in one year.  (Code:SM-L-28M) BI-RADS CATEGORY  1: Negative. Electronically Signed   By: Ted Mcalpine M.D.   On: 09/05/2022 11:09

## 2022-09-16 ENCOUNTER — Telehealth: Payer: Self-pay

## 2022-09-16 NOTE — Telephone Encounter (Signed)
[facility] Clinical Social Work  Clinical Social Work was referred by medical provider for assessment of psychosocial needs.  Clinical Social Worker attempted to contact patient by phone to offer support and assess for needs. CSW left second voicemail with direct contact information. Referral is closed.      Marguerita Merles, LCSWA Clinical Social Worker [facility]

## 2022-09-19 ENCOUNTER — Telehealth: Payer: Self-pay

## 2022-09-19 NOTE — Telephone Encounter (Signed)
Returned her call. She has decided to get bevacizumab. She would like her first treatment to be 9/23 if it is possible.

## 2022-09-20 ENCOUNTER — Other Ambulatory Visit: Payer: Self-pay | Admitting: Hematology and Oncology

## 2022-09-20 ENCOUNTER — Encounter: Payer: Self-pay | Admitting: Hematology and Oncology

## 2022-09-20 DIAGNOSIS — C549 Malignant neoplasm of corpus uteri, unspecified: Secondary | ICD-10-CM

## 2022-09-20 MED ORDER — DEXAMETHASONE 4 MG PO TABS: ORAL_TABLET | ORAL | 6 refills | Status: DC

## 2022-09-20 NOTE — Telephone Encounter (Signed)
Called back to verify what treatment she has chosen. She misunderstood the choices and has decided to get Enhertu.

## 2022-09-20 NOTE — Telephone Encounter (Signed)
I sent LOS to start 9/24 I will send premed dex to her pharmacy, remind her to take the night before and morning of chemo

## 2022-09-20 NOTE — Progress Notes (Signed)
DISCONTINUE OFF PATHWAY REGIMEN - Uterine   [id number]:Carboplatin IV + Paclitaxel IV + Trastuzumab IV q21 Days (C1-6) Followed by Trastuzumab IV q21 Days (C7+):   Cycle 1: A cycle is 21 days:     Trastuzumab-xxxx      Paclitaxel      Carboplatin    Cycles 2 through 6: A cycle is every 21 days:     Trastuzumab-xxxx      Paclitaxel      Carboplatin    Cycles 7 and beyond: A cycle is every 21 days:     Trastuzumab-xxxx   **Always confirm dose/schedule in your pharmacy ordering system**  REASON: Other Reason PRIOR TREATMENT: Off Pathway: Carboplatin IV + Paclitaxel IV + Trastuzumab IV q21 Days (C1-6) Followed by Trastuzumab IV q21 Days (C7+) TREATMENT RESPONSE: Partial Response (PR)  START OFF PATHWAY REGIMEN - Uterine   [id number]:Bevacizumab 15 mg/kg IV D1 (C2-6) + Carboplatin AUC=6 IV D1 + Paclitaxel 175 mg/m2 IV D1 (C1-6) q21 Days:   Cycle 1: A cycle is 21 days:     Paclitaxel      Carboplatin    Cycles 2 through 6: A cycle is every 21 days:     Bevacizumab-xxxx      Paclitaxel      Carboplatin   **Always confirm dose/schedule in your pharmacy ordering system**  Patient Characteristics: Carcinosarcoma, Recurrent/Progressive Disease, Second Line, 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 2+) Histology: Carcinosarcoma Therapeutic Status: Recurrent or Progressive Disease Line of Therapy: Second Line Time to Recurrence: Relapse < 12 Months From Prior Therapy HER2 Expression Status by IHC: Equivocal (IHC 2+) Intent of Therapy: Non-Curative / Palliative Intent, Discussed with Patient

## 2022-09-20 NOTE — Telephone Encounter (Signed)
Called and left a message asking her to call the office back regarding her treatment choice.

## 2022-09-20 NOTE — Progress Notes (Signed)
DISCONTINUE OFF PATHWAY REGIMEN - Uterine   [id number]:Bevacizumab 15 mg/kg IV D1 (C2-6) + Carboplatin AUC=6 IV D1 + Paclitaxel 175 mg/m2 IV D1 (C1-6) q21 Days:   Cycle 1: A cycle is 21 days:     Paclitaxel      Carboplatin    Cycles 2 through 6: A cycle is every 21 days:     Bevacizumab-xxxx      Paclitaxel      Carboplatin   **Always confirm dose/schedule in your pharmacy ordering system**  REASON: Other Reason PRIOR TREATMENT: Off Pathway: Bevacizumab 15 mg/kg IV D1 (C2-6) + Carboplatin AUC=6 IV D1 + Paclitaxel 175 mg/m2 IV D1 (C1-6) q21 Days TREATMENT RESPONSE: Unable to Evaluate  START OFF PATHWAY REGIMEN - Uterine   [id number]:Fam-trastuzumab deruxtecan-nxki 5.4 mg/kg IV D1 q21 Days:   A cycle is every 21 days:     Fam-trastuzumab deruxtecan-nxki   **Always confirm dose/schedule in your pharmacy ordering system**  Patient Characteristics: Carcinosarcoma, Recurrent/Progressive Disease, Second Line, 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 2+) Histology: Carcinosarcoma Therapeutic Status: Recurrent or Progressive Disease Line of Therapy: Second Line Time to Recurrence: Relapse < 12 Months From Prior Therapy HER2 Expression Status by IHC: Equivocal (IHC 2+) Intent of Therapy: Non-Curative / Palliative Intent, Discussed with Patient

## 2022-09-20 NOTE — Progress Notes (Signed)
OFF PATHWAY REGIMEN - Uterine  No Change  Continue With Treatment as Ordered.  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 2+) Histology: Carcinosarcoma Therapeutic Status: Recurrent or Progressive Disease Line of Therapy: Second Line Time to Recurrence: Relapse < 12 Months From Prior Therapy HER2 Expression Status by IHC: Equivocal (IHC 2+) Intent of Therapy: Non-Curative / Palliative Intent, Discussed with Patient

## 2022-09-21 ENCOUNTER — Other Ambulatory Visit: Payer: Self-pay

## 2022-09-22 ENCOUNTER — Inpatient Hospital Stay: Payer: Medicare HMO

## 2022-09-22 NOTE — Progress Notes (Signed)
[facility] CSW Counseling Note  Patient was referred by medical provider. Treatment type: Individual  Presenting Concerns: Patient and/or family reports the following symptoms/concerns: anxiety and adjusting to spouse's Alzheimer  Duration of problem: year; Severity of problem: moderate   Orientation:oriented to person, place, and time/date.   Affect: Congruent Risk of harm to self or others: No plan to harm self or others  Patient and/or Family's Strengths/Protective Factors: Social and Emotional competence, Concrete supports in place (healthy food, safe environments, etc.), and Sense of purposeAbility for insight  Active sense of humor  Average or above average intelligence  Capable of independent living  General fund of knowledge  Special hobby/interest      Goals Addressed: Patient will:  Reduce symptoms of: anxiety Increase knowledge and/or ability of: coping skills, self-management skills, and stress reduction  Increase healthy adjustment to current life circumstances   Progress towards Goals: Initial   Interventions: Interventions utilized:  Strength-based, Supportive, and Reframing      Assessment: Patient currently experiencing increased anxiety (racing thoughts at night) related to adjusting to cancer diagnosis and husbands Alzheimer. CSW received indepth history of patient's concerns and discussed potential options for assistance.      Plan: Follow up with CSW: 2 weeks Behavioral recommendations: reframing anxious thoughts and identifying fear at the root.  Referral(s): Support group(s):  Alzheimer Support Group and Yoga        Marguerita Merles, [address]

## 2022-09-23 ENCOUNTER — Other Ambulatory Visit: Payer: Self-pay

## 2022-09-24 ENCOUNTER — Encounter: Payer: Self-pay | Admitting: Hematology and Oncology

## 2022-10-03 MED FILL — Fosaprepitant Dimeglumine For IV Infusion 150 MG (Base Eq): INTRAVENOUS | Qty: 5 | Status: AC

## 2022-10-03 MED FILL — Dexamethasone Sodium Phosphate Inj 100 MG/10ML: INTRAMUSCULAR | Qty: 1 | Status: AC

## 2022-10-04 ENCOUNTER — Inpatient Hospital Stay: Payer: Medicare HMO

## 2022-10-04 ENCOUNTER — Encounter: Payer: Self-pay | Admitting: Hematology and Oncology

## 2022-10-04 ENCOUNTER — Inpatient Hospital Stay: Payer: Medicare HMO | Admitting: Hematology and Oncology

## 2022-10-04 VITALS — BP 135/72 | HR 86 | Resp 17

## 2022-10-04 VITALS — BP 141/80 | HR 83 | Temp 98.0°F | Resp 18 | Ht 59.5 in | Wt 144.4 lb

## 2022-10-04 DIAGNOSIS — M8580 Other specified disorders of bone density and structure, unspecified site: Secondary | ICD-10-CM | POA: Diagnosis not present

## 2022-10-04 DIAGNOSIS — C549 Malignant neoplasm of corpus uteri, unspecified: Secondary | ICD-10-CM

## 2022-10-04 DIAGNOSIS — Z90710 Acquired absence of both cervix and uterus: Secondary | ICD-10-CM | POA: Diagnosis not present

## 2022-10-04 DIAGNOSIS — N183 Chronic kidney disease, stage 3 unspecified: Secondary | ICD-10-CM

## 2022-10-04 DIAGNOSIS — D631 Anemia in chronic kidney disease: Secondary | ICD-10-CM

## 2022-10-04 DIAGNOSIS — C541 Malignant neoplasm of endometrium: Secondary | ICD-10-CM | POA: Diagnosis not present

## 2022-10-04 DIAGNOSIS — F419 Anxiety disorder, unspecified: Secondary | ICD-10-CM | POA: Diagnosis not present

## 2022-10-04 DIAGNOSIS — C778 Secondary and unspecified malignant neoplasm of lymph nodes of multiple regions: Secondary | ICD-10-CM | POA: Diagnosis not present

## 2022-10-04 DIAGNOSIS — Z90722 Acquired absence of ovaries, bilateral: Secondary | ICD-10-CM | POA: Diagnosis not present

## 2022-10-04 DIAGNOSIS — Z5112 Encounter for antineoplastic immunotherapy: Secondary | ICD-10-CM | POA: Diagnosis not present

## 2022-10-04 LAB — CBC WITH DIFFERENTIAL (CANCER CENTER ONLY)
Abs Immature Granulocytes: 0.01 10*3/uL (ref 0.00–0.07)
Basophils Absolute: 0 10*3/uL (ref 0.0–0.1)
Basophils Relative: 1 %
Eosinophils Absolute: 0.1 10*3/uL (ref 0.0–0.5)
Eosinophils Relative: 3 %
HCT: 34.9 % — ABNORMAL LOW (ref 36.0–46.0)
Hemoglobin: 11.9 g/dL — ABNORMAL LOW (ref 12.0–15.0)
Immature Granulocytes: 0 %
Lymphocytes Relative: 24 %
Lymphs Abs: 1.2 10*3/uL (ref 0.7–4.0)
MCH: 31.9 pg (ref 26.0–34.0)
MCHC: 34.1 g/dL (ref 30.0–36.0)
MCV: 93.6 fL (ref 80.0–100.0)
Monocytes Absolute: 0.6 10*3/uL (ref 0.1–1.0)
Monocytes Relative: 11 %
Neutro Abs: 3 10*3/uL (ref 1.7–7.7)
Neutrophils Relative %: 61 %
Platelet Count: 212 10*3/uL (ref 150–400)
RBC: 3.73 MIL/uL — ABNORMAL LOW (ref 3.87–5.11)
RDW: 12.5 % (ref 11.5–15.5)
WBC Count: 4.9 10*3/uL (ref 4.0–10.5)
nRBC: 0 % (ref 0.0–0.2)

## 2022-10-04 LAB — CMP (CANCER CENTER ONLY)
ALT: 20 U/L (ref 0–44)
AST: 27 U/L (ref 15–41)
Albumin: 4.1 g/dL (ref 3.5–5.0)
Alkaline Phosphatase: 116 U/L (ref 38–126)
Anion gap: 6 (ref 5–15)
BUN: 27 mg/dL — ABNORMAL HIGH (ref 8–23)
CO2: 27 mmol/L (ref 22–32)
Calcium: 9.3 mg/dL (ref 8.9–10.3)
Chloride: 102 mmol/L (ref 98–111)
Creatinine: 1.24 mg/dL — ABNORMAL HIGH (ref 0.44–1.00)
GFR, Estimated: 45 mL/min — ABNORMAL LOW (ref >60)
Glucose, Bld: 96 mg/dL (ref 70–99)
Potassium: 4.2 mmol/L (ref 3.5–5.1)
Sodium: 135 mmol/L (ref 135–145)
Total Bilirubin: 0.3 mg/dL (ref 0.3–1.2)
Total Protein: 7.3 g/dL (ref 6.5–8.1)

## 2022-10-04 MED ORDER — FAM-TRASTUZUMAB DERUXTECAN-NXKI CHEMO 100 MG IV SOLR
5.4000 mg/kg | Freq: Once | INTRAVENOUS | Status: AC
  Administered 2022-10-04: 360 mg via INTRAVENOUS
  Filled 2022-10-04: qty 18

## 2022-10-04 MED ORDER — SODIUM CHLORIDE 0.9% FLUSH
10.0000 mL | Freq: Once | INTRAVENOUS | Status: AC
  Administered 2022-10-04: 10 mL

## 2022-10-04 MED ORDER — DEXTROSE 5 % IV SOLN: Freq: Once | INTRAVENOUS | Status: AC

## 2022-10-04 MED ORDER — SODIUM CHLORIDE 0.9% FLUSH
10.0000 mL | INTRAVENOUS | Status: DC | PRN
  Administered 2022-10-04: 10 mL

## 2022-10-04 MED ORDER — PALONOSETRON HCL INJECTION 0.25 MG/5ML
0.2500 mg | Freq: Once | INTRAVENOUS | Status: AC
  Administered 2022-10-04: 0.25 mg via INTRAVENOUS
  Filled 2022-10-04: qty 5

## 2022-10-04 MED ORDER — HEPARIN SOD (PORK) LOCK FLUSH 100 UNIT/ML IV SOLN
500.0000 [IU] | Freq: Once | INTRAVENOUS | Status: AC | PRN
  Administered 2022-10-04: 500 [IU]

## 2022-10-04 MED ORDER — SODIUM CHLORIDE 0.9 % IV SOLN
10.0000 mg | Freq: Once | INTRAVENOUS | Status: AC
  Administered 2022-10-04: 10 mg via INTRAVENOUS
  Filled 2022-10-04: qty 10

## 2022-10-04 MED ORDER — TRAMADOL HCL 50 MG PO TABS: 50.0000 mg | ORAL_TABLET | Freq: Four times a day (QID) | ORAL | 0 refills | Status: DC | PRN

## 2022-10-04 MED ORDER — SODIUM CHLORIDE 0.9 % IV SOLN
150.0000 mg | Freq: Once | INTRAVENOUS | Status: AC
  Administered 2022-10-04: 150 mg via INTRAVENOUS
  Filled 2022-10-04: qty 150

## 2022-10-04 NOTE — Progress Notes (Signed)
[facility] OFFICE PROGRESS NOTE  Patient Care Team: Kayla Fila, MD as PCP - General (Gynecologic Oncology) Kayla Beat, MD as Consulting Physician [facility])  ASSESSMENT & PLAN:  Carcinosarcoma of body of uterus [facility]) We discussed risk and benefits of treatment and she is in agreement to proceed I recommend minimum 3 cycles of therapy before repeating imaging study She will be due for repeat echocardiogram end of October  Anemia in chronic kidney disease Her blood count is stable She is not symptomatic Observe only  No orders of the defined types were placed in this encounter.   All questions were answered. The patient knows to call the clinic with any problems, questions or concerns. The total time spent in the appointment was 20 minutes encounter with patients including review of chart and various tests results, discussions about plan of care and coordination of care plan   Kayla Delay, MD 10/04/2022 2:01 PM  INTERVAL HISTORY: Please see below for problem oriented charting. she returns for treatment follow-up We reviewed plan of care We discussed timing of echocardiogram and future imaging studies  REVIEW OF SYSTEMS:   Constitutional: Denies fevers, chills or abnormal weight loss Eyes: Denies blurriness of vision Ears, nose, mouth, throat, and face: Denies mucositis or sore throat Respiratory: Denies cough, dyspnea or wheezes Cardiovascular: Denies palpitation, chest discomfort or lower extremity swelling Gastrointestinal:  Denies nausea, heartburn or change in bowel habits Skin: Denies abnormal skin rashes Lymphatics: Denies new lymphadenopathy or easy bruising Neurological:Denies numbness, tingling or new weaknesses Behavioral/Psych: Mood is stable, no new changes  All other systems were reviewed with the patient and are negative.  I have reviewed the past medical history, past surgical history, social history and family history with  the patient and they are unchanged from previous note.  ALLERGIES:  is allergic to tylenol [acetaminophen] and latex.  MEDICATIONS:  Current Outpatient Medications  Medication Sig Dispense Refill   Cyanocobalamin (VITAMIN B 12 PO) Take 1 tablet by mouth.     escitalopram (LEXAPRO) 20 MG tablet Take 1 tablet (20 mg total) by mouth daily. 90 tablet 0   Magnesium 200 MG TABS Take 2 tablets by mouth daily.     ondansetron (ZOFRAN) 8 MG tablet Take 1 tablet (8 mg total) by mouth every 8 (eight) hours as needed for nausea or vomiting. Start on the third day after chemotherapy. 30 tablet 1   pantoprazole (PROTONIX) 40 MG tablet TAKE 1 TABLET BY MOUTH TWICE A DAY 180 tablet 3   prochlorperazine (COMPAZINE) 10 MG tablet Take 1 tablet (10 mg total) by mouth every 6 (six) hours as needed for nausea or vomiting. 30 tablet 1   traMADol (ULTRAM) 50 MG tablet Take 1 tablet (50 mg total) by mouth every 6 (six) hours as needed. 60 tablet 0   VITAMIN D, CHOLECALCIFEROL, PO Take 1 tablet by mouth daily.     No current facility-administered medications for this visit.   Facility-Administered Medications Ordered in Other Visits  Medication Dose Route Frequency Provider Last Rate Last Admin   fam-trastuzumab deruxtecan-nxki (ENHERTU) 360 mg in dextrose 5 % 100 mL chemo infusion  5.4 mg/kg (Treatment Plan Recorded) Intravenous Once Kayla [facility], Kayla Wichman, MD 78.7 mL/hr at 10/04/22 1322 360 mg at 10/04/22 1322   heparin lock flush 100 unit/mL  500 Units Intracatheter Once PRN Kayla Mcgrath, Kayla Wissing, MD       sodium chloride flush (NS) 0.9 % injection 10 mL  10 mL Intracatheter PRN Kayla Delay, MD  SUMMARY OF ONCOLOGIC HISTORY: Oncology History Overview Note  MMR normal MSI stable PD-L1 CPS: 1% Her2 positive P53 + Carcinosarcoma Neg genetics ER negative   Carcinosarcoma of body of uterus (HCC)  09/23/2021 Initial Diagnosis   She presented with PMB   09/28/2021 Imaging   US pelvis Enlarged uterus with fibroids. Pelvic  sonogram is otherwise unremarkable.    11/11/2021 Pathology Results   A. CERVIX, MASS, EXCISION:  - HIGH-GRADE UNDIFFERENTIATED MALIGNANCY WITH EXTENSIVE NECROSIS (SEE COMMENT).   COMMENT:  The tumor consists of mostly high-grade sarcomatoid morphology with focal epithelioid morphology.  Immunohistochemical stains are performed on block A4.  The tumor cells are positive for desmin and p16 while negative for CD45, CK7, CK20, chromogranin, synaptophysin, Melan-A, p40, AE1/AE3, SMA, p63, cyclin D1, S100 and smooth muscle myosin.  Ki-67 proliferation index is high.  CD10 staining shows focal nonspecific staining.  The differential diagnosis includes a high-grade sarcoma and a partially sampled carcinosarcoma.    11/25/2021 Imaging   MRI pelvis Marked distention of the entire endometrial cavity by heterogeneously enhancing soft tissue density, which extends through the endocervical canal into the lower vagina. This is highly suspicious for endometrial carcinoma.   Diffuse myometrial thinning due to marked distention of endometrial cavity limits evaluation; deep myometrial invasion cannot be excluded in the uterine fundus.   No evidence of extra-uterine extension of tumor.   Lymphadenopathy in lower abdominal retroperitoneum, bilateral iliac chains, and sigmoid mesocolon, consistent with metastatic disease.   Sigmoid diverticulosis. No radiographic evidence of diverticulitis.   11/26/2021 Initial Diagnosis   Uterine cancer (HCC)   11/26/2021 Cancer Staging   Staging form: Corpus Uteri - Carcinoma and Carcinosarcoma, AJCC 8th Edition - Clinical stage from 11/26/2021: FIGO Stage IIIC2 (cT3, cN2, cM0) - Signed by Kayla Delay, MD on 11/26/2021 Stage prefix: Initial diagnosis Histologic grade (G): G3 Histologic grading system: 3 grade system   11/30/2021 Pathology Results   FINAL MICROSCOPIC DIAGNOSIS:  A. UTERINE CONTENTS, BIOPSY: - HIGH-GRADE UNDIFFERENTIATED MALIGNANCY WITH EXTENSIVE  NECROSIS (SEE COMMENT).  COMMENT: The patient's history of a high-grade undifferentiated malignancy with extensive necrosis of the cervix is noted.  The morphological features of the current tumor cells are similar to the tumor cells seen in the previous cervical mass excision.  Immunohistochemical staining for desmin and cytokeratin AE1/AE3 is performed on block A1.  The tumor cells are diffusely positive for desmin.   AE1/AE3 stain is focally positive, however this is scant and of unclear clinical significance. Given the small quantity of tissue in the current biopsy, a definitive distinction between a high-grade sarcoma and carcinosarcoma cannot be made as this biopsy may not be representative of the entire tumor. If clinically indicated, this case as well as the previous cervical mass excision ([id number]) may be sent out for expert consultation. Clinical correlation recommended.     12/01/2021 Tumor Marker   Patient's tumor was tested for the following markers: CA-125. Results of the tumor marker test revealed 491.   12/22/2021 Surgery   Robotic-assisted laparoscopic total hysterectomy with bilateral salpingo-oophorectomy, tumor debulking including high common iliac lymph node excision, aborted attempt of bilateral external iliac enlarged lymph nodes, mini-lap for specimen delivery, cystoscopy    12/22/2021 Pathology Results   FINAL MICROSCOPIC DIAGNOSIS:  A. LEFT CERVICAL MARGIN, EXCISION: - Positive for carcinoma  B. HIGH LEFT COMMON ILIAC LYMPH NODE, EXCISION: - Metastatic carcinosarcoma involving the lymph node  C. 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, including  undifferentiated sarcoma with rhabdoid features and high-grade serous carcinoma, see comment - Tumor invades for a depth of 1 mm where myometrial thickness is 5 mm - Benign bilateral fallopian tubes and ovaries - See oncology  table - See comment  ONCOLOGY TABLE:  UTERUS, CARCINOMA OR CARCINOSARCOMA: Resection  Procedure: Total hysterectomy and bilateral salpingo-oophorectomy Histologic Type: Carcinosarcoma (mixed malignant Mullerian tumor) Histologic Grade: High-grade Myometrial Invasion:      Depth of Myometrial Invasion (mm): 1 mm      Myometrial Thickness (mm): 5 mm      Percentage of Myometrial Invasion: 20% Uterine Serosa Involvement: Not identified Cervical stromal Involvement: Present Extent of involvement of other tissue/organs: Not identified Peritoneal/Ascitic Fluid: Not applicable Lymphovascular Invasion: Present Regional Lymph Nodes:      Pelvic Lymph Nodes Examined:                                  0 Sentinel                                  1 Non-sentinel                                  1 Total      Pelvic Lymph Nodes with Metastasis: 1                          Macrometastasis: (>2.0 mm): 1                          Micrometastasis: (>0.2 mm and < 2.0 mm): 0                          Isolated Tumor Cells (<0.2 mm): 0                          Laterality of Lymph Node with Tumor: Left                          Extracapsular Extension: Not identified      Para-aortic Lymph Nodes Examined:                                   0 Sentinel                                   0 non-sentinel                                   0 total Distant Metastasis:      Distant Site(s) Involved: Not applicable Pathologic Stage Classification (pTNM, AJCC 8th Edition): pT1a, pN1 Ancillary Studies: MMR / MSI testing will be ordered Representative Tumor Block: C1 Comment(s): None  ([id number])       01/13/2022 Procedure   Placement of single lumen port a cath via right internal jugular vein. The catheter tip lies at the cavo-atrial junction. A power injectable port a cath was placed  and is ready for immediate use.     01/19/2022 Echocardiogram    1. Left ventricular ejection fraction, by estimation, is 60 to  65%. The left ventricle has normal function. The left ventricle has no regional wall motion abnormalities. Left ventricular diastolic parameters are consistent with Grade I diastolic dysfunction (impaired relaxation). The average left ventricular global longitudinal strain is -24.0 %. The global longitudinal strain is normal.  2. Right ventricular systolic function is normal. The right ventricular size is normal. There is normal pulmonary artery systolic pressure.  3. Left atrial size was mildly dilated.  4. The mitral valve is normal in structure. No evidence of mitral valve regurgitation. No evidence of mitral stenosis.  5. The aortic valve was not well visualized. Aortic valve regurgitation is not visualized. Aortic valve sclerosis is present, with no evidence of aortic valve stenosis.  6. The inferior vena cava is normal in size with greater than 50% respiratory variability, suggesting right atrial pressure of 3 mmHg.     01/27/2022 Tumor Marker   Patient's tumor was tested for the following markers: CA-125. Results of the tumor marker test revealed 307.   02/14/2022 - 08/22/2022 Chemotherapy   Patient is on Treatment Plan : UTERINE SEROUS CARCINOMA Carboplatin + Paclitaxel + Trastuzumab q21d x 6 Cycles / Trastuzumab q21d     02/15/2022 Tumor Marker   Patient's tumor was tested for the following markers: CA-125. Results of the tumor marker test revealed 161.   03/29/2022 Tumor Marker   Patient's tumor was tested for the following markers: CA-125. Results of the tumor marker test revealed 30.   04/13/2022 Echocardiogram   1. Left ventricular ejection fraction, by estimation, is 60 to 65%. The left ventricle has normal function. The left ventricle has no regional wall motion abnormalities. Left ventricular diastolic parameters are consistent with Grade I diastolic dysfunction (impaired relaxation). The average left ventricular global longitudinal strain is -19.4 %. The global longitudinal strain is  normal.  2. Right ventricular systolic function is normal. The right ventricular size is normal. There is normal pulmonary artery systolic pressure. The estimated right ventricular systolic pressure is 21.0 mmHg.  3. Left atrial size was mild to moderately dilated.  4. The mitral valve is grossly normal. Trivial mitral valve regurgitation.  5. The aortic valve is tricuspid. Aortic valve regurgitation is not visualized. Aortic valve sclerosis is present, with no evidence of aortic valve stenosis.  6. The inferior vena cava is normal in size with greater than 50% respiratory variability, suggesting right atrial pressure of 3 mmHg.   06/01/2022 Tumor Marker   Patient's tumor was tested for the following markers: CA-125. Results of the tumor marker test revealed 8.   06/05/2022 Genetic Testing   Negative genetic testing on the Multi-Cancer gene panel + RNA.  The report date is Jun 05, 2022.  The Multi-Cancer + RNA Panel offered by [facility] includes sequencing and/or deletion/duplication analysis of the following 70 genes:  AIP*, ALK, APC*, ATM*, AXIN2*, BAP1*, BARD1*, BLM*, BMPR1A*, BRCA1*, BRCA2*, BRIP1*, CDC73*, CDH1*, CDK4, CDKN1B*, CDKN2A, CHEK2*, CTNNA1*, DICER1*, EPCAM (del/dup only), EGFR, FH*, FLCN*, GREM1 (promoter dup only), HOXB13, KIT, LZTR1, MAX*, MBD4, MEN1*, MET, MITF, MLH1*, MSH2*, MSH3*, MSH6*, MUTYH*, NF1*, NF2*, NTHL1*, PALB2*, PDGFRA, PMS2*, POLD1*, POLE*, POT1*, PRKAR1A*, PTCH1*, PTEN*, RAD51C*, RAD51D*, RB1*, RET, SDHA* (sequencing only), SDHAF2*, SDHB*, SDHC*, SDHD*, SMAD4*, SMARCA4*, SMARCB1*, SMARCE1*, STK11*, SUFU*, TMEM127*, TP53*, TSC1*, TSC2*, VHL*. RNA analysis is performed for * genes.    06/20/2022 Imaging   1. Enlarged periaortic  and iliac lymph nodes consistent metastatic disease. Bulky iliac node on the LEFT. 2. No evidence of peritoneal metastasis. 3. Post hysterectomy and oophorectomy.   07/13/2022 Tumor Marker   Patient's tumor was tested for the following markers:  CA-125. Results of the tumor marker test revealed 8.8.   07/27/2022 Echocardiogram   ECHOCARDIOGRAM COMPLETE  Result Date: 07/27/2022    ECHOCARDIOGRAM REPORT   Patient Name:   Kayla Mcgrath Date of Exam: 07/27/2022 Medical Rec #:  [id number]      Height:       59.5 in Accession #:    [id number]     Weight:       140.4 lb Date of Birth:  1945-04-01      BSA:          1.596 m Patient Age:    76 years       BP:           135/92 mmHg Patient Gender: F              HR:           69 bpm. Exam Location:  Outpatient Procedure: 2D Echo, Color Doppler, Cardiac Doppler and Strain Analysis Indications:    Chemo  History:        Patient has prior history of Echocardiogram examinations, most                 recent 04/13/2022. Breast Cancer, Uterine Cancer, CKD; Risk                 Factors:Dyslipidemia.  Sonographer:    Milbert Coulter Referring Phys: [id number] Cherokee Clowers  Sonographer Comments: Global longitudinal strain was attempted. IMPRESSIONS  1. Prior GLS was -19.4 on 04/13/22 suggest f/u echo in 3 months to make sure not seeing early myocardial issues . Left ventricular ejection fraction, by estimation, is 55 to 60%. The left ventricle has normal function. The left ventricle has no regional wall motion abnormalities. Left ventricular diastolic parameters were normal. The average left ventricular global longitudinal strain is -15.9 %. The global longitudinal strain is normal.  2. Right ventricular systolic function is normal. The right ventricular size is normal.  3. The mitral valve is normal in structure. No evidence of mitral valve regurgitation. No evidence of mitral stenosis.  4. The aortic valve was not well visualized. There is mild calcification of the aortic valve. Aortic valve regurgitation is not visualized. Aortic valve sclerosis is present, with no evidence of aortic valve stenosis.  5. The inferior vena cava is normal in size with greater than 50% respiratory variability, suggesting right atrial pressure of 3  mmHg. FINDINGS  Left Ventricle: Prior GLS was -19.4 on 04/13/22 suggest f/u echo in 3 months to make sure not seeing early myocardial issues. Left ventricular ejection fraction, by estimation, is 55 to 60%. The left ventricle has normal function. The left ventricle has no regional wall motion abnormalities. The average left ventricular global longitudinal strain is -15.9 %. The global longitudinal strain is normal. The left ventricular internal cavity size was normal in size. There is no left ventricular hypertrophy. Left ventricular diastolic parameters were normal. Right Ventricle: The right ventricular size is normal. No increase in right ventricular wall thickness. Right ventricular systolic function is normal. Left Atrium: Left atrial size was normal in size. Right Atrium: Right atrial size was normal in size. Pericardium: There is no evidence of pericardial effusion. Mitral Valve: The mitral valve is normal in structure. No evidence  of mitral valve regurgitation. No evidence of mitral valve stenosis. Tricuspid Valve: The tricuspid valve is normal in structure. Tricuspid valve regurgitation is not demonstrated. No evidence of tricuspid stenosis. Aortic Valve: The aortic valve was not well visualized. There is mild calcification of the aortic valve. Aortic valve regurgitation is not visualized. Aortic valve sclerosis is present, with no evidence of aortic valve stenosis. Aortic valve mean gradient measures 4.0 mmHg. Aortic valve peak gradient measures 8.2 mmHg. Aortic valve area, by VTI measures 1.40 cm. Pulmonic Valve: The pulmonic valve was normal in structure. Pulmonic valve regurgitation is not visualized. No evidence of pulmonic stenosis. Aorta: The aortic root is normal in size and structure. Venous: The inferior vena cava is normal in size with greater than 50% respiratory variability, suggesting right atrial pressure of 3 mmHg. IAS/Shunts: No atrial level shunt detected by color flow Doppler.  LEFT  VENTRICLE PLAX 2D LVIDd:         4.00 cm   Diastology LVIDs:         2.60 cm   LV e' medial:    5.77 cm/s LV PW:         0.90 cm   LV E/e' medial:  10.8 LV IVS:        0.80 cm   LV e' lateral:   13.50 cm/s LVOT diam:     1.70 cm   LV E/e' lateral: 4.6 LV SV:         41 LV SV Index:   26        2D Longitudinal Strain LVOT Area:     2.27 cm  2D Strain GLS Avg:     -15.9 %  RIGHT VENTRICLE RV Basal diam:  2.70 cm RV Mid diam:    2.10 cm RV S prime:     12.80 cm/s TAPSE (M-mode): 2.1 cm LEFT ATRIUM             Index        RIGHT ATRIUM          Index LA diam:        2.70 cm 1.69 cm/m   RA Area:     8.87 cm LA Vol (A2C):   35.4 ml 22.18 ml/m  RA Volume:   16.30 ml 10.21 ml/m LA Vol (A4C):   36.6 ml 22.93 ml/m LA Biplane Vol: 36.1 ml 22.62 ml/m  AORTIC VALVE AV Area (Vmax):    1.38 cm AV Area (Vmean):   1.35 cm AV Area (VTI):     1.40 cm AV Vmax:           143.00 cm/s AV Vmean:          93.400 cm/s AV VTI:            0.293 m AV Peak Grad:      8.2 mmHg AV Mean Grad:      4.0 mmHg LVOT Vmax:         87.00 cm/s LVOT Vmean:        55.700 cm/s LVOT VTI:          0.180 m LVOT/AV VTI ratio: 0.62  AORTA Ao Root diam: 2.80 cm Ao Asc diam:  3.00 cm MITRAL VALVE               TRICUSPID VALVE MV Area (PHT): 3.30 cm    TR Peak grad:   21.7 mmHg MV Decel Time: 230 msec    TR Vmax:  233.00 cm/s MV E velocity: 62.10 cm/s MV A velocity: 87.80 cm/s  SHUNTS MV E/A ratio:  0.71        Systemic VTI:  0.18 m                            Systemic Diam: 1.70 cm Charlton Haws MD Electronically signed by Charlton Haws MD Signature Date/Time: 07/27/2022/10:27:37 AM    Final       08/23/2022 Tumor Marker   Patient's tumor was tested for the following markers: CA-125. Results of the tumor marker test revealed 7.2.   09/11/2022 Imaging   CT CHEST ABDOMEN PELVIS W CONTRAST  Result Date: 09/12/2022 CLINICAL DATA:  evaluate treatment response of uterine cancer/carcinosarcoma of uterus/endometrium. * Tracking Code: BO * EXAM: CT CHEST,  ABDOMEN, AND PELVIS WITH CONTRAST TECHNIQUE: Multidetector CT imaging of the chest, abdomen and pelvis was performed following the standard protocol during bolus administration of intravenous contrast. RADIATION DOSE REDUCTION: This exam was performed according to the departmental dose-optimization program which includes automated exposure control, adjustment of the mA and/or kV according to patient size and/or use of iterative reconstruction technique. CONTRAST:  80mL OMNIPAQUE IOHEXOL 300 MG/ML  SOLN COMPARISON:  06/13/2022 abdominopelvic CT. Most recent chest CT 12/01/2021 FINDINGS: CT CHEST FINDINGS Cardiovascular: right Port-A-Cath terminates at the high right atrium. Aortic atherosclerosis. Normal heart size, without pericardial effusion. Lad coronary artery calcification. No central pulmonary embolism, on this non-dedicated study. Mediastinum/Nodes: 1.4 cm right thyroid nodule. Not clinically significant; no follow-up imaging recommended (ref: J Am Coll Radiol. 2015 Feb;12(2): 143-50). No supraclavicular adenopathy. No axillary adenopathy. Right axillary node dissection. No mediastinal or hilar adenopathy. Esophageal fluid level on 23/2. Lungs/Pleura: No pleural fluid. Posterior central left upper lobe 7 x 6 mm nodule on 42/6 is similar to on image 46 of the prior. Musculoskeletal: Right breast implant. No acute osseous abnormality. CT ABDOMEN PELVIS FINDINGS Hepatobiliary: There may be a tiny cyst in segment 4A anteriorly. Normal gallbladder, without biliary ductal dilatation. Pancreas: Normal, without mass or ductal dilatation. Spleen: Normal in size, without focal abnormality. Adrenals/Urinary Tract: Normal adrenal glands. Normal kidneys, without hydronephrosis. Normal urinary bladder. Stomach/Bowel: Apparent proximal gastric wall thickening including on 43/2 is most likely due to underdistention. Normal distal stomach. Extensive colonic diverticulosis. Normal terminal ileum. Normal small bowel.  Vascular/Lymphatic: Aortic atherosclerosis. Index left periaortic node measures 1.2 x 1.7 cm on 71/2 versus 1.3 x 1.7 cm on the prior. A preaortic/pre caval 12 mm node on 72/2 is enlarged from 7 mm on the prior exam (when remeasured). More cephalad left periaortic node measures 1.0 cm on 60/2 versus 1.1 cm on the prior. Index right common iliac node measures 9 mm on 82/2 versus 13 mm on the prior. Right external iliac centrally necrotic node measures 1.4 cm on 95/2 versus 1.2 cm on the prior. Dominant left external iliac nodal mass measures 3.1 x 3.4 cm on 92/2 versus 2.3 x 2.8 cm on the prior exam Reproductive: Hysterectomy.  No adnexal mass. Other: No significant free fluid. No evidence of omental or peritoneal disease. Musculoskeletal: Osteopenia. S shaped thoracolumbar spine curvature. IMPRESSION: CT CHEST IMPRESSION 1. 7 x 6 mm left upper lobe pulmonary nodule is similar compared to 12/01/2021. Stability favors a benign etiology. Recommend attention on follow-up to exclude unlikely indolent primary neoplasm or isolated metastasis. 2. No thoracic adenopathy. 3. Coronary artery atherosclerosis. Aortic Atherosclerosis (ICD10-I70.0). 4. Esophageal air fluid level suggests dysmotility or gastroesophageal reflux.  CT ABDOMEN AND PELVIS IMPRESSION 1. Mixed response, with overall progression of nodal metastasis. Although some nodes are smaller, the dominant left pelvic sidewall nodal mass has significantly enlarged. 2. No new sites of metastatic disease identified. Electronically Signed   By: Jeronimo Greaves M.D.   On: 09/12/2022 11:07   MM 3D SCREENING MAMMOGRAM UNILATERAL LEFT BREAST  Result Date: 09/05/2022 CLINICAL DATA:  Screening. EXAM: DIGITAL SCREENING UNILATERAL LEFT MAMMOGRAM WITH CAD AND TOMOSYNTHESIS TECHNIQUE: Left screening digital craniocaudal and mediolateral oblique mammograms were obtained. Left screening digital breast tomosynthesis was performed. The images were evaluated with computer-aided  detection. COMPARISON:  Previous exam(s). ACR Breast Density Category c: The breasts are heterogeneously dense, which may obscure small masses. FINDINGS: The patient has had a right mastectomy. There are no findings suspicious for malignancy. IMPRESSION: No mammographic evidence of malignancy. A result letter of this screening mammogram will be mailed directly to the patient. RECOMMENDATION: Screening mammogram in one year.  (Code:SM-L-42M) BI-RADS CATEGORY  1: Negative. Electronically Signed   By: Ted Mcalpine M.D.   On: 09/05/2022 11:09      10/04/2022 - 10/04/2022 Chemotherapy   Patient is on Treatment Plan : Uterine Carboplatin + Paclitaxel + Bevacizumab q21d     10/04/2022 -  Chemotherapy   Patient is on Treatment Plan : BREAST METASTATIC Fam-Trastuzumab Deruxtecan-nxki (Enhertu) (5.4) q21d       PHYSICAL EXAMINATION: ECOG PERFORMANCE STATUS: 0 - Asymptomatic  Vitals:   10/04/22 1139  BP: (!) 141/80  Pulse: 83  Resp: 18  Temp: 98 F (36.7 C)  SpO2: 98%   Filed Weights   10/04/22 1139  Weight: 144 lb 6.4 oz (65.5 kg)    GENERAL:alert, no distress and comfortable   LABORATORY DATA:  I have reviewed the data as listed    Component Value Date/Time   NA 135 10/04/2022 1115   NA 140 11/02/2016 1130   K 4.2 10/04/2022 1115   K 3.8 11/02/2016 1130   CL 102 10/04/2022 1115   CL 105 05/02/2012 0822   CO2 27 10/04/2022 1115   CO2 26 11/02/2016 1130   GLUCOSE 96 10/04/2022 1115   GLUCOSE 87 11/02/2016 1130   GLUCOSE 98 05/02/2012 0822   BUN 27 (H) 10/04/2022 1115   BUN 14.7 11/02/2016 1130   CREATININE 1.24 (H) 10/04/2022 1115   CREATININE 0.84 12/05/2018 1014   CREATININE 0.8 11/02/2016 1130   CALCIUM 9.3 10/04/2022 1115   CALCIUM 9.7 11/02/2016 1130   PROT 7.3 10/04/2022 1115   PROT 6.8 11/02/2016 1130   ALBUMIN 4.1 10/04/2022 1115   ALBUMIN 3.6 11/02/2016 1130   AST 27 10/04/2022 1115   AST 23 11/02/2016 1130   ALT 20 10/04/2022 1115   ALT 17 11/02/2016  1130   ALKPHOS 116 10/04/2022 1115   ALKPHOS 59 11/02/2016 1130   BILITOT 0.3 10/04/2022 1115   BILITOT 0.34 11/02/2016 1130   GFRNONAA 45 (L) 10/04/2022 1115   GFRAA >60 11/08/2017 0504   GFRAA >60 07/25/2017 1425    No results found for: "SPEP", "UPEP"  Lab Results  Component Value Date   WBC 4.9 10/04/2022   NEUTROABS 3.0 10/04/2022   HGB 11.9 (L) 10/04/2022   HCT 34.9 (L) 10/04/2022   MCV 93.6 10/04/2022   PLT 212 10/04/2022      Chemistry      Component Value Date/Time   NA 135 10/04/2022 1115   NA 140 11/02/2016 1130   K 4.2 10/04/2022 1115   K 3.8 11/02/2016  1130   CL 102 10/04/2022 1115   CL 105 05/02/2012 0822   CO2 27 10/04/2022 1115   CO2 26 11/02/2016 1130   BUN 27 (H) 10/04/2022 1115   BUN 14.7 11/02/2016 1130   CREATININE 1.24 (H) 10/04/2022 1115   CREATININE 0.84 12/05/2018 1014   CREATININE 0.8 11/02/2016 1130      Component Value Date/Time   CALCIUM 9.3 10/04/2022 1115   CALCIUM 9.7 11/02/2016 1130   ALKPHOS 116 10/04/2022 1115   ALKPHOS 59 11/02/2016 1130   AST 27 10/04/2022 1115   AST 23 11/02/2016 1130   ALT 20 10/04/2022 1115   ALT 17 11/02/2016 1130   BILITOT 0.3 10/04/2022 1115   BILITOT 0.34 11/02/2016 1130       RADIOGRAPHIC STUDIES: I have personally reviewed the radiological images as listed and agreed with the findings in the report. 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 * Tracking Code: BO * EXAM: CT CHEST, ABDOMEN, AND PELVIS WITH CONTRAST TECHNIQUE: Multidetector CT imaging of the chest, abdomen and pelvis was performed following the standard protocol during bolus administration of intravenous contrast. RADIATION DOSE REDUCTION: This exam was performed according to the departmental dose-optimization program which includes automated exposure control, adjustment of the mA and/or kV according to patient size  and/or use of iterative reconstruction technique. CONTRAST:  80mL OMNIPAQUE IOHEXOL 300 MG/ML  SOLN COMPARISON:  06/13/2022 abdominopelvic CT. Most recent chest CT 12/01/2021 FINDINGS: CT CHEST FINDINGS Cardiovascular: right Port-A-Cath terminates at the high right atrium. Aortic atherosclerosis. Normal heart size, without pericardial effusion. Lad coronary artery calcification. No central pulmonary embolism, on this non-dedicated study. Mediastinum/Nodes: 1.4 cm right thyroid nodule. Not clinically significant; no follow-up imaging recommended (ref: J Am Coll Radiol. 2015 Feb;12(2): 143-50). No supraclavicular adenopathy. No axillary adenopathy. Right axillary node dissection. No mediastinal or hilar adenopathy. Esophageal fluid level on 23/2. Lungs/Pleura: No pleural fluid. Posterior central left upper lobe 7 x 6 mm nodule on 42/6 is similar to on image 46 of the prior. Musculoskeletal: Right breast implant. No acute osseous abnormality. CT ABDOMEN PELVIS FINDINGS Hepatobiliary: There may be a tiny cyst in segment 4A anteriorly. Normal gallbladder, without biliary ductal dilatation. Pancreas: Normal, without mass or ductal dilatation. Spleen: Normal in size, without focal abnormality. Adrenals/Urinary Tract: Normal adrenal glands. Normal kidneys, without hydronephrosis. Normal urinary bladder. Stomach/Bowel: Apparent proximal gastric wall thickening including on 43/2 is most likely due to underdistention. Normal distal stomach. Extensive colonic diverticulosis. Normal terminal ileum. Normal small bowel. Vascular/Lymphatic: Aortic atherosclerosis. Index left periaortic node measures 1.2 x 1.7 cm on 71/2 versus 1.3 x 1.7 cm on the prior. A preaortic/pre caval 12 mm node on 72/2 is enlarged from 7 mm on the prior exam (when remeasured). More cephalad left periaortic node measures 1.0 cm on 60/2 versus 1.1 cm on the prior. Index right common iliac node measures 9 mm on 82/2 versus 13 mm on the prior. Right external  iliac centrally necrotic node measures 1.4 cm on 95/2 versus 1.2 cm on the prior. Dominant left external iliac nodal mass measures 3.1 x 3.4 cm on 92/2 versus 2.3 x 2.8 cm on the prior exam Reproductive: Hysterectomy.  No adnexal mass. Other: No significant free fluid. No evidence of omental or peritoneal disease. Musculoskeletal: Osteopenia. S shaped thoracolumbar spine curvature. IMPRESSION: CT CHEST IMPRESSION 1. 7 x 6 mm left upper lobe pulmonary nodule is similar compared to 12/01/2021. Stability favors  a benign etiology. Recommend attention on follow-up to exclude unlikely indolent primary neoplasm or isolated metastasis. 2. No thoracic adenopathy. 3. Coronary artery atherosclerosis. Aortic Atherosclerosis (ICD10-I70.0). 4. Esophageal air fluid level suggests dysmotility or gastroesophageal reflux. CT ABDOMEN AND PELVIS IMPRESSION 1. Mixed response, with overall progression of nodal metastasis. Although some nodes are smaller, the dominant left pelvic sidewall nodal mass has significantly enlarged. 2. No new sites of metastatic disease identified. Electronically Signed   By: Jeronimo Greaves M.D.   On: 09/12/2022 11:07

## 2022-10-04 NOTE — Assessment & Plan Note (Signed)
We discussed risk and benefits of treatment and she is in agreement to proceed I recommend minimum 3 cycles of therapy before repeating imaging study She will be due for repeat echocardiogram end of October

## 2022-10-04 NOTE — Patient Instructions (Addendum)
[facility] [facility]  Discharge Instructions: Thank you for choosing [facility] to provide your oncology and hematology care.   If you have a lab appointment with the Cancer Center, please go directly to the Cancer Center and check in at the registration area.   Wear comfortable clothing and clothing appropriate for easy access to any Portacath or PICC line.   We strive to give you quality time with your provider. You may need to reschedule your appointment if you arrive late (15 or more minutes).  Arriving late affects you and other patients whose appointments are after yours.  Also, if you miss three or more appointments without notifying the office, you may be dismissed from the clinic at the provider's discretion.      For prescription refill requests, have your pharmacy contact our office and allow 72 hours for refills to be completed.    Today you received the following chemotherapy and/or immunotherapy agents: Enhertu      To help prevent nausea and vomiting after your treatment, we encourage you to take your nausea medication as directed.  BELOW ARE SYMPTOMS THAT SHOULD BE REPORTED IMMEDIATELY: *FEVER GREATER THAN 100.4 F (38 C) OR HIGHER *CHILLS OR SWEATING *NAUSEA AND VOMITING THAT IS NOT CONTROLLED WITH YOUR NAUSEA MEDICATION *UNUSUAL SHORTNESS OF BREATH *UNUSUAL BRUISING OR BLEEDING *URINARY PROBLEMS (pain or burning when urinating, or frequent urination) *BOWEL PROBLEMS (unusual diarrhea, constipation, pain near the anus) TENDERNESS IN MOUTH AND THROAT WITH OR WITHOUT PRESENCE OF ULCERS (sore throat, sores in mouth, or a toothache) UNUSUAL RASH, SWELLING OR PAIN  UNUSUAL VAGINAL DISCHARGE OR ITCHING   Items with * indicate a potential emergency and should be followed up as soon as possible or go to the Emergency Department if any problems should occur.  Please show the CHEMOTHERAPY ALERT CARD or IMMUNOTHERAPY ALERT CARD at  check-in to the Emergency Department and triage nurse.  Should you have questions after your visit or need to cancel or reschedule your appointment, please contact [facility]  Dept: [phone]  and follow the prompts.  Office hours are 8:00 a.m. to 4:30 p.m. Monday - Friday. Please note that voicemails left after 4:00 p.m. may not be returned until the following business day.  We are closed weekends and major holidays. You have access to a nurse at all times for urgent questions. Please call the main number to the clinic Dept: [phone] and follow the prompts.   For any non-urgent questions, you may also contact your provider using MyChart. We now offer e-Visits for anyone 21 and older to request care online for non-urgent symptoms. For details visit [email or web address].   Also download the MyChart app! Go to the app store, search "MyChart", open the app, select [place] [place], and log in with your MyChart username and password.  Fam-Trastuzumab Deruxtecan Injection What is this medication? FAM-TRASTUZUMAB DERUXTECAN (fam-tras TOOZ eu mab DER ux TEE kan) treats some types of cancer. It works by blocking a protein that causes cancer cells to grow and multiply. This helps to slow or stop the spread of cancer cells. This medicine may be used for other purposes; ask your health care provider or pharmacist if you have questions. COMMON BRAND NAME(S): ENHERTU What should I tell my care team before I take this medication? They need to know if you have any of these conditions: Heart disease Heart failure Infection, especially a viral infection, such as chickenpox, cold  sores, or herpes Liver disease Lung or breathing disease, such as asthma or COPD An unusual or allergic reaction to fam-trastuzumab deruxtecan, other medications, foods, dyes, or preservatives Pregnant or trying to get pregnant Breast-feeding How should I use this medication? This medication  is injected into a vein. It is given by your care team in a hospital or clinic setting. A special MedGuide will be given to you before each treatment. Be sure to read this information carefully each time. Talk to your care team about the use of this medication in children. Special care may be needed. Overdosage: If you think you have taken too much of this medicine contact a poison control center or emergency room at once. NOTE: This medicine is only for you. Do not share this medicine with others. What if I miss a dose? It is important not to miss your dose. Call your care team if you are unable to keep an appointment. What may interact with this medication? Interactions are not expected. This list may not describe all possible interactions. Give your health care provider a list of all the medicines, herbs, non-prescription drugs, or dietary supplements you use. Also tell them if you smoke, drink alcohol, or use illegal drugs. Some items may interact with your medicine. What should I watch for while using this medication? Visit your care team for regular checks on your progress. Tell your care team if your symptoms do not start to get better or if they get worse. Your condition will be monitored carefully while you are receiving this medication. Do not become pregnant while taking this medication or for 7 months after stopping it. Women should inform their care team if they wish to become pregnant or think they might be pregnant. Men should not father a child while taking this medication and for 4 months after stopping it. There is potential for serious side effects to an unborn child. Talk to your care team for more information. Do not breast-feed an infant while taking this medication or for 7 months after the last dose. This medication has caused decreased sperm counts in some men. This may make it more difficult to father a child. Talk to your care team if you are concerned about your  fertility. This medication may increase your risk to bruise or bleed. Call your care team if you notice any unusual bleeding. Be careful brushing or flossing your teeth or using a toothpick because you may get an infection or bleed more easily. If you have any dental work done, tell your dentist you are receiving this medication. This medication may cause dry eyes and blurred vision. If you wear contact lenses, you may feel some discomfort. Lubricating eye drops may help. See your care team if the problem does not go away or is severe. This medication may increase your risk of getting an infection. Call your care team for advice if you get a fever, chills, sore throat, or other symptoms of a cold or flu. Do not treat yourself. Try to avoid being around people who are sick. Avoid taking medications that contain aspirin, acetaminophen, ibuprofen, naproxen, or ketoprofen unless instructed by your care team. These medications may hide a fever. What side effects may I notice from receiving this medication? Side effects that you should report to your care team as soon as possible: Allergic reactions--skin rash, itching, hives, swelling of the face, lips, tongue, or throat Dry cough, shortness of breath or trouble breathing Infection--fever, chills, cough, sore throat, wounds that  don't heal, pain or trouble when passing urine, general feeling of discomfort or being unwell Heart failure--shortness of breath, swelling of the ankles, feet, or hands, sudden weight gain, unusual weakness or fatigue Unusual bruising or bleeding Side effects that usually do not require medical attention (report these to your care team if they continue or are bothersome): Constipation Diarrhea Hair loss Muscle pain Nausea Vomiting This list may not describe all possible side effects. Call your doctor for medical advice about side effects. You may report side effects to FDA at 1-800-FDA-1088. Where should I keep my  medication? This medication is given in a hospital or clinic. It will not be stored at home. NOTE: This sheet is a summary. It may not cover all possible information. If you have questions about this medicine, talk to your doctor, pharmacist, or health care provider.  2024 Elsevier/Gold Standard (2020-10-13 00:00:00)

## 2022-10-04 NOTE — Assessment & Plan Note (Signed)
Her blood count is stable She is not symptomatic Observe only

## 2022-10-05 ENCOUNTER — Telehealth: Payer: Self-pay

## 2022-10-05 LAB — CA 125: Cancer Antigen (CA) 125: 7.8 U/mL (ref 0.0–38.1)

## 2022-10-05 NOTE — Telephone Encounter (Signed)
-----   Message from Nurse Zetta Bills sent at 10/04/2022  3:28 PM EDT ----- Regarding: Kayla Mcgrath 1ST TIME ENHERTU Patient received 1st dose of Enhertu.  Tolerated well.  No s/s or c/o distress or discomfort.

## 2022-10-05 NOTE — Telephone Encounter (Signed)
LM for patient that this nurse was calling to see how they were doing after their treatment. Please call back to Dr. Gorsuch's nurse at [phone] if they have any questions or concerns regarding the treatment. 

## 2022-10-19 ENCOUNTER — Other Ambulatory Visit: Payer: Self-pay

## 2022-10-24 MED FILL — Fosaprepitant Dimeglumine For IV Infusion 150 MG (Base Eq): INTRAVENOUS | Qty: 5 | Status: AC

## 2022-10-24 MED FILL — Dexamethasone Sodium Phosphate Inj 100 MG/10ML: INTRAMUSCULAR | Qty: 1 | Status: AC

## 2022-10-25 ENCOUNTER — Inpatient Hospital Stay: Payer: Medicare HMO | Attending: Hematology and Oncology

## 2022-10-25 ENCOUNTER — Encounter: Payer: Self-pay | Admitting: Hematology and Oncology

## 2022-10-25 ENCOUNTER — Inpatient Hospital Stay: Payer: Medicare HMO

## 2022-10-25 ENCOUNTER — Inpatient Hospital Stay: Payer: Medicare HMO | Admitting: Hematology and Oncology

## 2022-10-25 DIAGNOSIS — C549 Malignant neoplasm of corpus uteri, unspecified: Secondary | ICD-10-CM | POA: Diagnosis not present

## 2022-10-25 DIAGNOSIS — F32A Depression, unspecified: Secondary | ICD-10-CM | POA: Diagnosis not present

## 2022-10-25 DIAGNOSIS — C770 Secondary and unspecified malignant neoplasm of lymph nodes of head, face and neck: Secondary | ICD-10-CM | POA: Diagnosis not present

## 2022-10-25 DIAGNOSIS — Z5112 Encounter for antineoplastic immunotherapy: Secondary | ICD-10-CM | POA: Diagnosis not present

## 2022-10-25 DIAGNOSIS — Z79899 Other long term (current) drug therapy: Secondary | ICD-10-CM | POA: Diagnosis not present

## 2022-10-25 DIAGNOSIS — Z90710 Acquired absence of both cervix and uterus: Secondary | ICD-10-CM | POA: Diagnosis not present

## 2022-10-25 DIAGNOSIS — K209 Esophagitis, unspecified without bleeding: Secondary | ICD-10-CM | POA: Diagnosis not present

## 2022-10-25 DIAGNOSIS — Z90722 Acquired absence of ovaries, bilateral: Secondary | ICD-10-CM | POA: Insufficient documentation

## 2022-10-25 DIAGNOSIS — D61818 Other pancytopenia: Secondary | ICD-10-CM | POA: Diagnosis not present

## 2022-10-25 LAB — CMP (CANCER CENTER ONLY)
ALT: 15 U/L (ref 0–44)
AST: 21 U/L (ref 15–41)
Albumin: 3.7 g/dL (ref 3.5–5.0)
Alkaline Phosphatase: 103 U/L (ref 38–126)
Anion gap: 6 (ref 5–15)
BUN: 18 mg/dL (ref 8–23)
CO2: 26 mmol/L (ref 22–32)
Calcium: 9.2 mg/dL (ref 8.9–10.3)
Chloride: 108 mmol/L (ref 98–111)
Creatinine: 1.21 mg/dL — ABNORMAL HIGH (ref 0.44–1.00)
GFR, Estimated: 47 mL/min — ABNORMAL LOW (ref >60)
Glucose, Bld: 102 mg/dL — ABNORMAL HIGH (ref 70–99)
Potassium: 3.6 mmol/L (ref 3.5–5.1)
Sodium: 140 mmol/L (ref 135–145)
Total Bilirubin: 0.3 mg/dL (ref 0.3–1.2)
Total Protein: 6.8 g/dL (ref 6.5–8.1)

## 2022-10-25 LAB — CBC WITH DIFFERENTIAL (CANCER CENTER ONLY)
Abs Immature Granulocytes: 0.02 10*3/uL (ref 0.00–0.07)
Basophils Absolute: 0.1 10*3/uL (ref 0.0–0.1)
Basophils Relative: 1 %
Eosinophils Absolute: 0.2 10*3/uL (ref 0.0–0.5)
Eosinophils Relative: 5 %
HCT: 31.9 % — ABNORMAL LOW (ref 36.0–46.0)
Hemoglobin: 10.5 g/dL — ABNORMAL LOW (ref 12.0–15.0)
Immature Granulocytes: 1 %
Lymphocytes Relative: 26 %
Lymphs Abs: 1 10*3/uL (ref 0.7–4.0)
MCH: 30.9 pg (ref 26.0–34.0)
MCHC: 32.9 g/dL (ref 30.0–36.0)
MCV: 93.8 fL (ref 80.0–100.0)
Monocytes Absolute: 0.7 10*3/uL (ref 0.1–1.0)
Monocytes Relative: 17 %
Neutro Abs: 1.9 10*3/uL (ref 1.7–7.7)
Neutrophils Relative %: 50 %
Platelet Count: 303 10*3/uL (ref 150–400)
RBC: 3.4 MIL/uL — ABNORMAL LOW (ref 3.87–5.11)
RDW: 13.9 % (ref 11.5–15.5)
WBC Count: 3.8 10*3/uL — ABNORMAL LOW (ref 4.0–10.5)
nRBC: 0 % (ref 0.0–0.2)

## 2022-10-25 MED ORDER — FAM-TRASTUZUMAB DERUXTECAN-NXKI CHEMO 100 MG IV SOLR
5.4000 mg/kg | Freq: Once | INTRAVENOUS | Status: AC
  Administered 2022-10-25: 360 mg via INTRAVENOUS
  Filled 2022-10-25: qty 18

## 2022-10-25 MED ORDER — PANTOPRAZOLE SODIUM 40 MG PO TBEC: 40.0000 mg | DELAYED_RELEASE_TABLET | Freq: Every day | ORAL | Status: DC

## 2022-10-25 MED ORDER — SODIUM CHLORIDE 0.9% FLUSH
10.0000 mL | Freq: Once | INTRAVENOUS | Status: AC
  Administered 2022-10-25: 10 mL

## 2022-10-25 MED ORDER — SODIUM CHLORIDE 0.9 % IV SOLN
150.0000 mg | Freq: Once | INTRAVENOUS | Status: AC
  Administered 2022-10-25: 150 mg via INTRAVENOUS
  Filled 2022-10-25: qty 150

## 2022-10-25 MED ORDER — SODIUM CHLORIDE 0.9 % IV SOLN
10.0000 mg | Freq: Once | INTRAVENOUS | Status: AC
  Administered 2022-10-25: 10 mg via INTRAVENOUS
  Filled 2022-10-25: qty 10

## 2022-10-25 MED ORDER — SODIUM CHLORIDE 0.9% FLUSH
10.0000 mL | INTRAVENOUS | Status: DC | PRN
  Administered 2022-10-25: 10 mL

## 2022-10-25 MED ORDER — HEPARIN SOD (PORK) LOCK FLUSH 100 UNIT/ML IV SOLN
500.0000 [IU] | Freq: Once | INTRAVENOUS | Status: AC | PRN
  Administered 2022-10-25: 500 [IU]

## 2022-10-25 MED ORDER — PALONOSETRON HCL INJECTION 0.25 MG/5ML
0.2500 mg | Freq: Once | INTRAVENOUS | Status: AC
  Administered 2022-10-25: 0.25 mg via INTRAVENOUS
  Filled 2022-10-25: qty 5

## 2022-10-25 MED ORDER — DEXTROSE 5 % IV SOLN: Freq: Once | INTRAVENOUS | Status: AC

## 2022-10-25 NOTE — Patient Instructions (Signed)
[place] [facility]  Discharge Instructions: Thank you for choosing [facility] to provide your oncology and hematology care.   If you have a lab appointment with the Cancer Center, please go directly to the Cancer Center and check in at the registration area.   Wear comfortable clothing and clothing appropriate for easy access to any Portacath or PICC line.   We strive to give you quality time with your provider. You may need to reschedule your appointment if you arrive late (15 or more minutes).  Arriving late affects you and other patients whose appointments are after yours.  Also, if you miss three or more appointments without notifying the office, you may be dismissed from the clinic at the provider's discretion.      For prescription refill requests, have your pharmacy contact our office and allow 72 hours for refills to be completed.    Today you received the following chemotherapy and/or immunotherapy agents enhertu      To help prevent nausea and vomiting after your treatment, we encourage you to take your nausea medication as directed.  BELOW ARE SYMPTOMS THAT SHOULD BE REPORTED IMMEDIATELY: *FEVER GREATER THAN 100.4 F (38 C) OR HIGHER *CHILLS OR SWEATING *NAUSEA AND VOMITING THAT IS NOT CONTROLLED WITH YOUR NAUSEA MEDICATION *UNUSUAL SHORTNESS OF BREATH *UNUSUAL BRUISING OR BLEEDING *URINARY PROBLEMS (pain or burning when urinating, or frequent urination) *BOWEL PROBLEMS (unusual diarrhea, constipation, pain near the anus) TENDERNESS IN MOUTH AND THROAT WITH OR WITHOUT PRESENCE OF ULCERS (sore throat, sores in mouth, or a toothache) UNUSUAL RASH, SWELLING OR PAIN  UNUSUAL VAGINAL DISCHARGE OR ITCHING   Items with * indicate a potential emergency and should be followed up as soon as possible or go to the Emergency Department if any problems should occur.  Please show the CHEMOTHERAPY ALERT CARD or IMMUNOTHERAPY ALERT CARD at check-in  to the Emergency Department and triage nurse.  Should you have questions after your visit or need to cancel or reschedule your appointment, please contact [place] [facility]  Dept: [phone]  and follow the prompts.  Office hours are 8:00 a.m. to 4:30 p.m. Monday - Friday. Please note that voicemails left after 4:00 p.m. may not be returned until the following business day.  We are closed weekends and major holidays. You have access to a nurse at all times for urgent questions. Please call the main number to the clinic Dept: [phone] and follow the prompts.   For any non-urgent questions, you may also contact your provider using MyChart. We now offer e-Visits for anyone 71 and older to request care online for non-urgent symptoms. For details visit [email or web address].   Also download the MyChart app! Go to the app store, search "MyChart", open the app, select [place], and log in with your MyChart username and password.

## 2022-10-25 NOTE — Progress Notes (Signed)
[facility] OFFICE PROGRESS NOTE  Patient Care Team: Carver Fila, MD as PCP - General (Gynecologic Oncology) Hannah Beat, MD as Consulting Physician [facility])  HISTORY OF PRESENTING ILLNESS: Discussed the use of AI scribe software for clinical note transcription with the patient, who gave verbal consent to proceed.  History of Present Illness   The patient, with a history of uterine cancer, presents for a follow-up after recent chemotherapy. She reports experiencing intermittent nausea and diarrhea following the treatment. The nausea was described as a 'funny' feeling in the stomach, severe enough to prevent eating. The diarrhea was frequent and loose, starting two days post-infusion and lasting for two days. The patient managed the diarrhea with Imodium, which was effective.  Additionally, the patient reports intermittent shortness of breath and fatigue, particularly noticeable when rushing. She denies any associated cough. Despite these side effects, the patient does not believe this chemotherapy regimen is harder to tolerate than previous ones and maintains functional energy levels.  The patient also reports a history of severe esophagitis, currently managed with twice-daily medication. She expresses curiosity about the duration of this treatment. Lastly, the patient mentions a recent increase in her Lexapro dosage to 20mg , with some confusion about the pharmacy it was sent to.         Assessment and Plan    Carcinosarcoma of uterus Reported nausea, intermittent diarrhea, and shortness of breath following recent chemotherapy infusion. No vomiting. Diarrhea managed with Imodium. -Continue to have Imodium on hand for potential diarrhea following future chemotherapy infusions. -Monitor for continued shortness of breath.  Esophagitis History of severe esophagitis, currently managed with twice daily PPI. -Reduce PPI to once daily.  Acquired  pancytopenia Mild decrease in hemoglobin, potentially contributing to fatigue. Likely secondary to chemotherapy. -Monitor hemoglobin levels during future treatments.  Depression On Lexapro 20mg , unclear if prescription was filled at [facility]. -Confirm Lexapro prescription with [facility].  Follow-up Next chemotherapy infusion scheduled for November 7th, with a CT scan to be done before November 26th.          No orders of the defined types were placed in this encounter.   All questions were answered. The patient knows to call the clinic with any problems, questions or concerns. The total time spent in the appointment was 30 minutes encounter with patients including review of chart and various tests results, discussions about plan of care and coordination of care plan   Artis Delay, MD 10/25/2022 11:45 AM  REVIEW OF SYSTEMS:  All other systems were reviewed with the patient and are negative.  I have reviewed the past medical history, past surgical history, social history and family history with the patient and they are unchanged from previous note.  ALLERGIES:  is allergic to tylenol [acetaminophen] and latex.  MEDICATIONS:  Current Outpatient Medications  Medication Sig Dispense Refill   Cyanocobalamin (VITAMIN B 12 PO) Take 1 tablet by mouth.     escitalopram (LEXAPRO) 20 MG tablet Take 1 tablet (20 mg total) by mouth daily. 90 tablet 0   Magnesium 200 MG TABS Take 2 tablets by mouth daily.     ondansetron (ZOFRAN) 8 MG tablet Take 1 tablet (8 mg total) by mouth every 8 (eight) hours as needed for nausea or vomiting. Start on the third day after chemotherapy. 30 tablet 1   pantoprazole (PROTONIX) 40 MG tablet Take 1 tablet (40 mg total) by mouth daily.     prochlorperazine (COMPAZINE) 10 MG tablet Take 1 tablet (10 mg  total) by mouth every 6 (six) hours as needed for nausea or vomiting. 30 tablet 1   traMADol (ULTRAM) 50 MG tablet Take 1 tablet (50 mg total) by mouth every 6 (six) hours as  needed. 60 tablet 0   VITAMIN D, CHOLECALCIFEROL, PO Take 1 tablet by mouth daily.     No current facility-administered medications for this visit.   Facility-Administered Medications Ordered in Other Visits  Medication Dose Route Frequency Provider Last Rate Last Admin   dexamethasone (DECADRON) 10 mg in sodium chloride 0.9 % 50 mL IVPB  10 mg Intravenous Once Bertis Ruddy, Rosaleah Person, MD 204 mL/hr at 10/25/22 1133 10 mg at 10/25/22 1133   fosaprepitant (EMEND) 150 mg in sodium chloride 0.9 % 145 mL IVPB  150 mg Intravenous Once Artis Delay, MD 450 mL/hr at 10/25/22 1135 150 mg at 10/25/22 1135   heparin lock flush 100 unit/mL  500 Units Intracatheter Once PRN Bertis Ruddy, Bianney Rockwood, MD       sodium chloride flush (NS) 0.9 % injection 10 mL  10 mL Intracatheter PRN Bertis Ruddy, Morissa Obeirne, MD        SUMMARY OF ONCOLOGIC HISTORY: Oncology History Overview Note  MMR normal MSI stable PD-L1 CPS: 1% Her2 positive P53 + Carcinosarcoma Neg genetics ER negative   Carcinosarcoma of body of uterus (HCC)  09/23/2021 Initial Diagnosis   She presented with PMB   09/28/2021 Imaging   US pelvis Enlarged uterus with fibroids. Pelvic sonogram is otherwise unremarkable.    11/11/2021 Pathology Results   A. CERVIX, MASS, EXCISION:  - HIGH-GRADE UNDIFFERENTIATED MALIGNANCY WITH EXTENSIVE NECROSIS (SEE COMMENT).   COMMENT:  The tumor consists of mostly high-grade sarcomatoid morphology with focal epithelioid morphology.  Immunohistochemical stains are performed on block A4.  The tumor cells are positive for desmin and p16 while negative for CD45, CK7, CK20, chromogranin, synaptophysin, Melan-A, p40, AE1/AE3, SMA, p63, cyclin D1, S100 and smooth muscle myosin.  Ki-67 proliferation index is high.  CD10 staining shows focal nonspecific staining.  The differential diagnosis includes a high-grade sarcoma and a partially sampled carcinosarcoma.    11/25/2021 Imaging   MRI pelvis Marked distention of the entire endometrial cavity by  heterogeneously enhancing soft tissue density, which extends through the endocervical canal into the lower vagina. This is highly suspicious for endometrial carcinoma.   Diffuse myometrial thinning due to marked distention of endometrial cavity limits evaluation; deep myometrial invasion cannot be excluded in the uterine fundus.   No evidence of extra-uterine extension of tumor.   Lymphadenopathy in lower abdominal retroperitoneum, bilateral iliac chains, and sigmoid mesocolon, consistent with metastatic disease.   Sigmoid diverticulosis. No radiographic evidence of diverticulitis.   11/26/2021 Initial Diagnosis   Uterine cancer (HCC)   11/26/2021 Cancer Staging   Staging form: Corpus Uteri - Carcinoma and Carcinosarcoma, AJCC 8th Edition - Clinical stage from 11/26/2021: FIGO Stage IIIC2 (cT3, cN2, cM0) - Signed by Artis Delay, MD on 11/26/2021 Stage prefix: Initial diagnosis Histologic grade (G): G3 Histologic grading system: 3 grade system   11/30/2021 Pathology Results   FINAL MICROSCOPIC DIAGNOSIS:  A. UTERINE CONTENTS, BIOPSY: - HIGH-GRADE UNDIFFERENTIATED MALIGNANCY WITH EXTENSIVE NECROSIS (SEE COMMENT).  COMMENT: The patient's history of a high-grade undifferentiated malignancy with extensive necrosis of the cervix is noted.  The morphological features of the current tumor cells are similar to the tumor cells seen in the previous cervical mass excision.  Immunohistochemical staining for desmin and cytokeratin AE1/AE3 is performed on block A1.  The tumor cells are diffusely positive for desmin.  AE1/AE3 stain is focally positive, however this is scant and of unclear clinical significance. Given the small quantity of tissue in the current biopsy, a definitive distinction between a high-grade sarcoma and carcinosarcoma cannot be made as this biopsy may not be representative of the entire tumor. If clinically indicated, this case as well as the previous cervical mass excision  ([id number]) may be sent out for expert consultation. Clinical correlation recommended.     12/01/2021 Tumor Marker   Patient's tumor was tested for the following markers: CA-125. Results of the tumor marker test revealed 491.   12/22/2021 Surgery   Robotic-assisted laparoscopic total hysterectomy with bilateral salpingo-oophorectomy, tumor debulking including high common iliac lymph node excision, aborted attempt of bilateral external iliac enlarged lymph nodes, mini-lap for specimen delivery, cystoscopy    12/22/2021 Pathology Results   FINAL MICROSCOPIC DIAGNOSIS:  A. LEFT CERVICAL MARGIN, EXCISION: - Positive for carcinoma  B. HIGH LEFT COMMON ILIAC LYMPH NODE, EXCISION: - Metastatic carcinosarcoma involving the lymph node  C. 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, including undifferentiated sarcoma with rhabdoid features and high-grade serous carcinoma, see comment - Tumor invades for a depth of 1 mm where myometrial thickness is 5 mm - Benign bilateral fallopian tubes and ovaries - See oncology table - See comment  ONCOLOGY TABLE:  UTERUS, CARCINOMA OR CARCINOSARCOMA: Resection  Procedure: Total hysterectomy and bilateral salpingo-oophorectomy Histologic Type: Carcinosarcoma (mixed malignant Mullerian tumor) Histologic Grade: High-grade Myometrial Invasion:      Depth of Myometrial Invasion (mm): 1 mm      Myometrial Thickness (mm): 5 mm      Percentage of Myometrial Invasion: 20% Uterine Serosa Involvement: Not identified Cervical stromal Involvement: Present Extent of involvement of other tissue/organs: Not identified Peritoneal/Ascitic Fluid: Not applicable Lymphovascular Invasion: Present Regional Lymph Nodes:      Pelvic Lymph Nodes Examined:                                  0 Sentinel                                  1 Non-sentinel                                  1  Total      Pelvic Lymph Nodes with Metastasis: 1                          Macrometastasis: (>2.0 mm): 1                          Micrometastasis: (>0.2 mm and < 2.0 mm): 0                          Isolated Tumor Cells (<0.2 mm): 0                          Laterality of Lymph Node with Tumor: Left                          Extracapsular Extension: Not identified  Para-aortic Lymph Nodes Examined:                                   0 Sentinel                                   0 non-sentinel                                   0 total Distant Metastasis:      Distant Site(s) Involved: Not applicable Pathologic Stage Classification (pTNM, AJCC 8th Edition): pT1a, pN1 Ancillary Studies: MMR / MSI testing will be ordered Representative Tumor Block: C1 Comment(s): None  ([id number])       01/13/2022 Procedure   Placement of single lumen port a cath via right internal jugular vein. The catheter tip lies at the cavo-atrial junction. A power injectable port a cath was placed and is ready for immediate use.     01/19/2022 Echocardiogram    1. Left ventricular ejection fraction, by estimation, is 60 to 65%. The left ventricle has normal function. The left ventricle has no regional wall motion abnormalities. Left ventricular diastolic parameters are consistent with Grade I diastolic dysfunction (impaired relaxation). The average left ventricular global longitudinal strain is -24.0 %. The global longitudinal strain is normal.  2. Right ventricular systolic function is normal. The right ventricular size is normal. There is normal pulmonary artery systolic pressure.  3. Left atrial size was mildly dilated.  4. The mitral valve is normal in structure. No evidence of mitral valve regurgitation. No evidence of mitral stenosis.  5. The aortic valve was not well visualized. Aortic valve regurgitation is not visualized. Aortic valve sclerosis is present, with no evidence of aortic valve stenosis.  6. The  inferior vena cava is normal in size with greater than 50% respiratory variability, suggesting right atrial pressure of 3 mmHg.     01/27/2022 Tumor Marker   Patient's tumor was tested for the following markers: CA-125. Results of the tumor marker test revealed 307.   02/14/2022 - 08/22/2022 Chemotherapy   Patient is on Treatment Plan : UTERINE SEROUS CARCINOMA Carboplatin + Paclitaxel + Trastuzumab q21d x 6 Cycles / Trastuzumab q21d     02/15/2022 Tumor Marker   Patient's tumor was tested for the following markers: CA-125. Results of the tumor marker test revealed 161.   03/29/2022 Tumor Marker   Patient's tumor was tested for the following markers: CA-125. Results of the tumor marker test revealed 30.   04/13/2022 Echocardiogram   1. Left ventricular ejection fraction, by estimation, is 60 to 65%. The left ventricle has normal function. The left ventricle has no regional wall motion abnormalities. Left ventricular diastolic parameters are consistent with Grade I diastolic dysfunction (impaired relaxation). The average left ventricular global longitudinal strain is -19.4 %. The global longitudinal strain is normal.  2. Right ventricular systolic function is normal. The right ventricular size is normal. There is normal pulmonary artery systolic pressure. The estimated right ventricular systolic pressure is 21.0 mmHg.  3. Left atrial size was mild to moderately dilated.  4. The mitral valve is grossly normal. Trivial mitral valve regurgitation.  5. The aortic valve is tricuspid. Aortic valve regurgitation is not visualized. Aortic valve sclerosis is present, with no evidence of aortic valve  stenosis.  6. The inferior vena cava is normal in size with greater than 50% respiratory variability, suggesting right atrial pressure of 3 mmHg.   06/01/2022 Tumor Marker   Patient's tumor was tested for the following markers: CA-125. Results of the tumor marker test revealed 8.   06/05/2022 Genetic Testing    Negative genetic testing on the Multi-Cancer gene panel + RNA.  The report date is Jun 05, 2022.  The Multi-Cancer + RNA Panel offered by [facility] includes sequencing and/or deletion/duplication analysis of the following 70 genes:  AIP*, ALK, APC*, ATM*, AXIN2*, BAP1*, BARD1*, BLM*, BMPR1A*, BRCA1*, BRCA2*, BRIP1*, CDC73*, CDH1*, CDK4, CDKN1B*, CDKN2A, CHEK2*, CTNNA1*, DICER1*, EPCAM (del/dup only), EGFR, FH*, FLCN*, GREM1 (promoter dup only), HOXB13, KIT, LZTR1, MAX*, MBD4, MEN1*, MET, MITF, MLH1*, MSH2*, MSH3*, MSH6*, MUTYH*, NF1*, NF2*, NTHL1*, PALB2*, PDGFRA, PMS2*, POLD1*, POLE*, POT1*, PRKAR1A*, PTCH1*, PTEN*, RAD51C*, RAD51D*, RB1*, RET, SDHA* (sequencing only), SDHAF2*, SDHB*, SDHC*, SDHD*, SMAD4*, SMARCA4*, SMARCB1*, SMARCE1*, STK11*, SUFU*, TMEM127*, TP53*, TSC1*, TSC2*, VHL*. RNA analysis is performed for * genes.    06/20/2022 Imaging   1. Enlarged periaortic and iliac lymph nodes consistent metastatic disease. Bulky iliac node on the LEFT. 2. No evidence of peritoneal metastasis. 3. Post hysterectomy and oophorectomy.   07/13/2022 Tumor Marker   Patient's tumor was tested for the following markers: CA-125. Results of the tumor marker test revealed 8.8.   07/27/2022 Echocardiogram   ECHOCARDIOGRAM COMPLETE  Result Date: 07/27/2022    ECHOCARDIOGRAM REPORT   Patient Name:   Kayla Mcgrath Fifita Date of Exam: 07/27/2022 Medical Rec #:  [id number]      Height:       59.5 in Accession #:    [id number]     Weight:       140.4 lb Date of Birth:  10-13-1945      BSA:          1.596 m Patient Age:    76 years       BP:           135/92 mmHg Patient Gender: F              HR:           69 bpm. Exam Location:  Outpatient Procedure: 2D Echo, Color Doppler, Cardiac Doppler and Strain Analysis Indications:    Chemo  History:        Patient has prior history of Echocardiogram examinations, most                 recent 04/13/2022. Breast Cancer, Uterine Cancer, CKD; Risk                 Factors:Dyslipidemia.   Sonographer:    Milbert Coulter Referring Phys: [id number] Shawna Wearing  Sonographer Comments: Global longitudinal strain was attempted. IMPRESSIONS  1. Prior GLS was -19.4 on 04/13/22 suggest f/u echo in 3 months to make sure not seeing early myocardial issues . Left ventricular ejection fraction, by estimation, is 55 to 60%. The left ventricle has normal function. The left ventricle has no regional wall motion abnormalities. Left ventricular diastolic parameters were normal. The average left ventricular global longitudinal strain is -15.9 %. The global longitudinal strain is normal.  2. Right ventricular systolic function is normal. The right ventricular size is normal.  3. The mitral valve is normal in structure. No evidence of mitral valve regurgitation. No evidence of mitral stenosis.  4. The aortic valve was not well visualized. There is mild calcification of the aortic  valve. Aortic valve regurgitation is not visualized. Aortic valve sclerosis is present, with no evidence of aortic valve stenosis.  5. The inferior vena cava is normal in size with greater than 50% respiratory variability, suggesting right atrial pressure of 3 mmHg. FINDINGS  Left Ventricle: Prior GLS was -19.4 on 04/13/22 suggest f/u echo in 3 months to make sure not seeing early myocardial issues. Left ventricular ejection fraction, by estimation, is 55 to 60%. The left ventricle has normal function. The left ventricle has no regional wall motion abnormalities. The average left ventricular global longitudinal strain is -15.9 %. The global longitudinal strain is normal. The left ventricular internal cavity size was normal in size. There is no left ventricular hypertrophy. Left ventricular diastolic parameters were normal. Right Ventricle: The right ventricular size is normal. No increase in right ventricular wall thickness. Right ventricular systolic function is normal. Left Atrium: Left atrial size was normal in size. Right Atrium: Right atrial size was  normal in size. Pericardium: There is no evidence of pericardial effusion. Mitral Valve: The mitral valve is normal in structure. No evidence of mitral valve regurgitation. No evidence of mitral valve stenosis. Tricuspid Valve: The tricuspid valve is normal in structure. Tricuspid valve regurgitation is not demonstrated. No evidence of tricuspid stenosis. Aortic Valve: The aortic valve was not well visualized. There is mild calcification of the aortic valve. Aortic valve regurgitation is not visualized. Aortic valve sclerosis is present, with no evidence of aortic valve stenosis. Aortic valve mean gradient measures 4.0 mmHg. Aortic valve peak gradient measures 8.2 mmHg. Aortic valve area, by VTI measures 1.40 cm. Pulmonic Valve: The pulmonic valve was normal in structure. Pulmonic valve regurgitation is not visualized. No evidence of pulmonic stenosis. Aorta: The aortic root is normal in size and structure. Venous: The inferior vena cava is normal in size with greater than 50% respiratory variability, suggesting right atrial pressure of 3 mmHg. IAS/Shunts: No atrial level shunt detected by color flow Doppler.  LEFT VENTRICLE PLAX 2D LVIDd:         4.00 cm   Diastology LVIDs:         2.60 cm   LV e' medial:    5.77 cm/s LV PW:         0.90 cm   LV E/e' medial:  10.8 LV IVS:        0.80 cm   LV e' lateral:   13.50 cm/s LVOT diam:     1.70 cm   LV E/e' lateral: 4.6 LV SV:         41 LV SV Index:   26        2D Longitudinal Strain LVOT Area:     2.27 cm  2D Strain GLS Avg:     -15.9 %  RIGHT VENTRICLE RV Basal diam:  2.70 cm RV Mid diam:    2.10 cm RV S prime:     12.80 cm/s TAPSE (M-mode): 2.1 cm LEFT ATRIUM             Index        RIGHT ATRIUM          Index LA diam:        2.70 cm 1.69 cm/m   RA Area:     8.87 cm LA Vol (A2C):   35.4 ml 22.18 ml/m  RA Volume:   16.30 ml 10.21 ml/m LA Vol (A4C):   36.6 ml 22.93 ml/m LA Biplane Vol: 36.1 ml 22.62 ml/m  AORTIC VALVE  AV Area (Vmax):    1.38 cm AV Area (Vmean):    1.35 cm AV Area (VTI):     1.40 cm AV Vmax:           143.00 cm/s AV Vmean:          93.400 cm/s AV VTI:            0.293 m AV Peak Grad:      8.2 mmHg AV Mean Grad:      4.0 mmHg LVOT Vmax:         87.00 cm/s LVOT Vmean:        55.700 cm/s LVOT VTI:          0.180 m LVOT/AV VTI ratio: 0.62  AORTA Ao Root diam: 2.80 cm Ao Asc diam:  3.00 cm MITRAL VALVE               TRICUSPID VALVE MV Area (PHT): 3.30 cm    TR Peak grad:   21.7 mmHg MV Decel Time: 230 msec    TR Vmax:        233.00 cm/s MV E velocity: 62.10 cm/s MV A velocity: 87.80 cm/s  SHUNTS MV E/A ratio:  0.71        Systemic VTI:  0.18 m                            Systemic Diam: 1.70 cm Charlton Haws MD Electronically signed by Charlton Haws MD Signature Date/Time: 07/27/2022/10:27:37 AM    Final       08/23/2022 Tumor Marker   Patient's tumor was tested for the following markers: CA-125. Results of the tumor marker test revealed 7.2.   09/11/2022 Imaging   CT CHEST ABDOMEN PELVIS W CONTRAST  Result Date: 09/12/2022 CLINICAL DATA:  evaluate treatment response of uterine cancer/carcinosarcoma of uterus/endometrium. * Tracking Code: BO * EXAM: CT CHEST, ABDOMEN, AND PELVIS WITH CONTRAST TECHNIQUE: Multidetector CT imaging of the chest, abdomen and pelvis was performed following the standard protocol during bolus administration of intravenous contrast. RADIATION DOSE REDUCTION: This exam was performed according to the departmental dose-optimization program which includes automated exposure control, adjustment of the mA and/or kV according to patient size and/or use of iterative reconstruction technique. CONTRAST:  80mL OMNIPAQUE IOHEXOL 300 MG/ML  SOLN COMPARISON:  06/13/2022 abdominopelvic CT. Most recent chest CT 12/01/2021 FINDINGS: CT CHEST FINDINGS Cardiovascular: right Port-A-Cath terminates at the high right atrium. Aortic atherosclerosis. Normal heart size, without pericardial effusion. Lad coronary artery calcification. No central pulmonary  embolism, on this non-dedicated study. Mediastinum/Nodes: 1.4 cm right thyroid nodule. Not clinically significant; no follow-up imaging recommended (ref: J Am Coll Radiol. 2015 Feb;12(2): 143-50). No supraclavicular adenopathy. No axillary adenopathy. Right axillary node dissection. No mediastinal or hilar adenopathy. Esophageal fluid level on 23/2. Lungs/Pleura: No pleural fluid. Posterior central left upper lobe 7 x 6 mm nodule on 42/6 is similar to on image 46 of the prior. Musculoskeletal: Right breast implant. No acute osseous abnormality. CT ABDOMEN PELVIS FINDINGS Hepatobiliary: There may be a tiny cyst in segment 4A anteriorly. Normal gallbladder, without biliary ductal dilatation. Pancreas: Normal, without mass or ductal dilatation. Spleen: Normal in size, without focal abnormality. Adrenals/Urinary Tract: Normal adrenal glands. Normal kidneys, without hydronephrosis. Normal urinary bladder. Stomach/Bowel: Apparent proximal gastric wall thickening including on 43/2 is most likely due to underdistention. Normal distal stomach. Extensive colonic diverticulosis. Normal terminal ileum. Normal small bowel. Vascular/Lymphatic: Aortic atherosclerosis. Index left periaortic  node measures 1.2 x 1.7 cm on 71/2 versus 1.3 x 1.7 cm on the prior. A preaortic/pre caval 12 mm node on 72/2 is enlarged from 7 mm on the prior exam (when remeasured). More cephalad left periaortic node measures 1.0 cm on 60/2 versus 1.1 cm on the prior. Index right common iliac node measures 9 mm on 82/2 versus 13 mm on the prior. Right external iliac centrally necrotic node measures 1.4 cm on 95/2 versus 1.2 cm on the prior. Dominant left external iliac nodal mass measures 3.1 x 3.4 cm on 92/2 versus 2.3 x 2.8 cm on the prior exam Reproductive: Hysterectomy.  No adnexal mass. Other: No significant free fluid. No evidence of omental or peritoneal disease. Musculoskeletal: Osteopenia. S shaped thoracolumbar spine curvature. IMPRESSION: CT CHEST  IMPRESSION 1. 7 x 6 mm left upper lobe pulmonary nodule is similar compared to 12/01/2021. Stability favors a benign etiology. Recommend attention on follow-up to exclude unlikely indolent primary neoplasm or isolated metastasis. 2. No thoracic adenopathy. 3. Coronary artery atherosclerosis. Aortic Atherosclerosis (ICD10-I70.0). 4. Esophageal air fluid level suggests dysmotility or gastroesophageal reflux. CT ABDOMEN AND PELVIS IMPRESSION 1. Mixed response, with overall progression of nodal metastasis. Although some nodes are smaller, the dominant left pelvic sidewall nodal mass has significantly enlarged. 2. No new sites of metastatic disease identified. Electronically Signed   By: Jeronimo Greaves M.D.   On: 09/12/2022 11:07   MM 3D SCREENING MAMMOGRAM UNILATERAL LEFT BREAST  Result Date: 09/05/2022 CLINICAL DATA:  Screening. EXAM: DIGITAL SCREENING UNILATERAL LEFT MAMMOGRAM WITH CAD AND TOMOSYNTHESIS TECHNIQUE: Left screening digital craniocaudal and mediolateral oblique mammograms were obtained. Left screening digital breast tomosynthesis was performed. The images were evaluated with computer-aided detection. COMPARISON:  Previous exam(s). ACR Breast Density Category c: The breasts are heterogeneously dense, which may obscure small masses. FINDINGS: The patient has had a right mastectomy. There are no findings suspicious for malignancy. IMPRESSION: No mammographic evidence of malignancy. A result letter of this screening mammogram will be mailed directly to the patient. RECOMMENDATION: Screening mammogram in one year.  (Code:[id number]) BI-RADS CATEGORY  1: Negative. Electronically Signed   By: Ted Mcalpine M.D.   On: 09/05/2022 11:09      10/04/2022 - 10/04/2022 Chemotherapy   Patient is on Treatment Plan : Uterine Carboplatin + Paclitaxel + Bevacizumab q21d     10/04/2022 -  Chemotherapy   Patient is on Treatment Plan : BREAST METASTATIC Fam-Trastuzumab Deruxtecan-nxki (Enhertu) (5.4) q21d        PHYSICAL EXAMINATION: ECOG PERFORMANCE STATUS: 1 - Symptomatic but completely ambulatory  Vitals:   10/25/22 1054  BP: (!) 161/79  Pulse: 66  Resp: 18  Temp: 97.8 F (36.6 C)  SpO2: 99%   Filed Weights   10/25/22 1054  Weight: 145 lb 9.6 oz (66 kg)    GENERAL:alert, no distress and comfortable  LABORATORY DATA:  I have reviewed the data as listed    Component Value Date/Time   NA 140 10/25/2022 1030   NA 140 11/02/2016 1130   K 3.6 10/25/2022 1030   K 3.8 11/02/2016 1130   CL 108 10/25/2022 1030   CL 105 05/02/2012 0822   CO2 26 10/25/2022 1030   CO2 26 11/02/2016 1130   GLUCOSE 102 (H) 10/25/2022 1030   GLUCOSE 87 11/02/2016 1130   GLUCOSE 98 05/02/2012 0822   BUN 18 10/25/2022 1030   BUN 14.7 11/02/2016 1130   CREATININE 1.21 (H) 10/25/2022 1030   CREATININE 0.84 12/05/2018 1014   CREATININE  0.8 11/02/2016 1130   CALCIUM 9.2 10/25/2022 1030   CALCIUM 9.7 11/02/2016 1130   PROT 6.8 10/25/2022 1030   PROT 6.8 11/02/2016 1130   ALBUMIN 3.7 10/25/2022 1030   ALBUMIN 3.6 11/02/2016 1130   AST 21 10/25/2022 1030   AST 23 11/02/2016 1130   ALT 15 10/25/2022 1030   ALT 17 11/02/2016 1130   ALKPHOS 103 10/25/2022 1030   ALKPHOS 59 11/02/2016 1130   BILITOT 0.3 10/25/2022 1030   BILITOT 0.34 11/02/2016 1130   GFRNONAA 47 (L) 10/25/2022 1030   GFRAA >60 11/08/2017 0504   GFRAA >60 07/25/2017 1425    No results found for: "SPEP", "UPEP"  Lab Results  Component Value Date   WBC 3.8 (L) 10/25/2022   NEUTROABS 1.9 10/25/2022   HGB 10.5 (L) 10/25/2022   HCT 31.9 (L) 10/25/2022   MCV 93.8 10/25/2022   PLT 303 10/25/2022      Chemistry      Component Value Date/Time   NA 140 10/25/2022 1030   NA 140 11/02/2016 1130   K 3.6 10/25/2022 1030   K 3.8 11/02/2016 1130   CL 108 10/25/2022 1030   CL 105 05/02/2012 0822   CO2 26 10/25/2022 1030   CO2 26 11/02/2016 1130   BUN 18 10/25/2022 1030   BUN 14.7 11/02/2016 1130   CREATININE 1.21 (H) 10/25/2022  1030   CREATININE 0.84 12/05/2018 1014   CREATININE 0.8 11/02/2016 1130      Component Value Date/Time   CALCIUM 9.2 10/25/2022 1030   CALCIUM 9.7 11/02/2016 1130   ALKPHOS 103 10/25/2022 1030   ALKPHOS 59 11/02/2016 1130   AST 21 10/25/2022 1030   AST 23 11/02/2016 1130   ALT 15 10/25/2022 1030   ALT 17 11/02/2016 1130   BILITOT 0.3 10/25/2022 1030   BILITOT 0.34 11/02/2016 1130       RADIOGRAPHIC STUDIES: I have personally reviewed the radiological images as listed and agreed with the findings in the report. No results found.

## 2022-10-28 ENCOUNTER — Other Ambulatory Visit: Payer: Self-pay

## 2022-11-10 ENCOUNTER — Telehealth: Payer: Self-pay | Admitting: Licensed Clinical Social Worker

## 2022-11-10 NOTE — Telephone Encounter (Signed)
CSW attempted to contact pt regarding request for caregiver support groups (related to Alzheimer's). No answer. CSW left direct contact information.   Vaunda Gutterman E Hart Haas, LCSW

## 2022-11-16 MED FILL — Fosaprepitant Dimeglumine For IV Infusion 150 MG (Base Eq): INTRAVENOUS | Qty: 5 | Status: AC

## 2022-11-17 ENCOUNTER — Inpatient Hospital Stay: Payer: Medicare HMO

## 2022-11-17 ENCOUNTER — Encounter: Payer: Self-pay | Admitting: Hematology and Oncology

## 2022-11-17 ENCOUNTER — Inpatient Hospital Stay: Payer: Medicare HMO | Attending: Hematology and Oncology

## 2022-11-17 ENCOUNTER — Inpatient Hospital Stay: Payer: Medicare HMO | Admitting: Hematology and Oncology

## 2022-11-17 VITALS — BP 127/83 | HR 80 | Temp 98.3°F | Resp 18 | Ht 59.5 in | Wt 142.2 lb

## 2022-11-17 DIAGNOSIS — C549 Malignant neoplasm of corpus uteri, unspecified: Secondary | ICD-10-CM

## 2022-11-17 DIAGNOSIS — D631 Anemia in chronic kidney disease: Secondary | ICD-10-CM | POA: Diagnosis not present

## 2022-11-17 DIAGNOSIS — C541 Malignant neoplasm of endometrium: Secondary | ICD-10-CM | POA: Insufficient documentation

## 2022-11-17 DIAGNOSIS — Z5112 Encounter for antineoplastic immunotherapy: Secondary | ICD-10-CM | POA: Insufficient documentation

## 2022-11-17 DIAGNOSIS — Z90722 Acquired absence of ovaries, bilateral: Secondary | ICD-10-CM | POA: Insufficient documentation

## 2022-11-17 DIAGNOSIS — N183 Chronic kidney disease, stage 3 unspecified: Secondary | ICD-10-CM | POA: Diagnosis not present

## 2022-11-17 DIAGNOSIS — Z90710 Acquired absence of both cervix and uterus: Secondary | ICD-10-CM | POA: Diagnosis not present

## 2022-11-17 DIAGNOSIS — C775 Secondary and unspecified malignant neoplasm of intrapelvic lymph nodes: Secondary | ICD-10-CM | POA: Diagnosis not present

## 2022-11-17 LAB — CBC WITH DIFFERENTIAL (CANCER CENTER ONLY)
Abs Immature Granulocytes: 0.02 10*3/uL (ref 0.00–0.07)
Basophils Absolute: 0.1 10*3/uL (ref 0.0–0.1)
Basophils Relative: 1 %
Eosinophils Absolute: 0.3 10*3/uL (ref 0.0–0.5)
Eosinophils Relative: 6 %
HCT: 33.6 % — ABNORMAL LOW (ref 36.0–46.0)
Hemoglobin: 11.3 g/dL — ABNORMAL LOW (ref 12.0–15.0)
Immature Granulocytes: 0 %
Lymphocytes Relative: 25 %
Lymphs Abs: 1.1 10*3/uL (ref 0.7–4.0)
MCH: 31.7 pg (ref 26.0–34.0)
MCHC: 33.6 g/dL (ref 30.0–36.0)
MCV: 94.1 fL (ref 80.0–100.0)
Monocytes Absolute: 0.8 10*3/uL (ref 0.1–1.0)
Monocytes Relative: 18 %
Neutro Abs: 2.2 10*3/uL (ref 1.7–7.7)
Neutrophils Relative %: 50 %
Platelet Count: 334 10*3/uL (ref 150–400)
RBC: 3.57 MIL/uL — ABNORMAL LOW (ref 3.87–5.11)
RDW: 15.9 % — ABNORMAL HIGH (ref 11.5–15.5)
WBC Count: 4.5 10*3/uL (ref 4.0–10.5)
nRBC: 0 % (ref 0.0–0.2)

## 2022-11-17 LAB — CMP (CANCER CENTER ONLY)
ALT: 17 U/L (ref 0–44)
AST: 23 U/L (ref 15–41)
Albumin: 3.7 g/dL (ref 3.5–5.0)
Alkaline Phosphatase: 108 U/L (ref 38–126)
Anion gap: 7 (ref 5–15)
BUN: 13 mg/dL (ref 8–23)
CO2: 28 mmol/L (ref 22–32)
Calcium: 9.1 mg/dL (ref 8.9–10.3)
Chloride: 103 mmol/L (ref 98–111)
Creatinine: 1.04 mg/dL — ABNORMAL HIGH (ref 0.44–1.00)
GFR, Estimated: 56 mL/min — ABNORMAL LOW (ref >60)
Glucose, Bld: 98 mg/dL (ref 70–99)
Potassium: 3.9 mmol/L (ref 3.5–5.1)
Sodium: 138 mmol/L (ref 135–145)
Total Bilirubin: 0.3 mg/dL (ref <1.2)
Total Protein: 6.9 g/dL (ref 6.5–8.1)

## 2022-11-17 MED ORDER — SODIUM CHLORIDE 0.9% FLUSH
10.0000 mL | INTRAVENOUS | Status: DC | PRN
Start: 2022-11-17 — End: 2022-11-17
  Administered 2022-11-17: 10 mL

## 2022-11-17 MED ORDER — SODIUM CHLORIDE 0.9% FLUSH
10.0000 mL | Freq: Once | INTRAVENOUS | Status: AC
  Administered 2022-11-17: 10 mL

## 2022-11-17 MED ORDER — SODIUM CHLORIDE 0.9 % IV SOLN
150.0000 mg | Freq: Once | INTRAVENOUS | Status: AC
  Administered 2022-11-17: 150 mg via INTRAVENOUS
  Filled 2022-11-17: qty 150

## 2022-11-17 MED ORDER — DEXTROSE 5 % IV SOLN: Freq: Once | INTRAVENOUS | Status: AC

## 2022-11-17 MED ORDER — FAM-TRASTUZUMAB DERUXTECAN-NXKI CHEMO 100 MG IV SOLR
5.4000 mg/kg | Freq: Once | INTRAVENOUS | Status: AC
  Administered 2022-11-17: 360 mg via INTRAVENOUS
  Filled 2022-11-17: qty 18

## 2022-11-17 MED ORDER — DEXAMETHASONE SODIUM PHOSPHATE 10 MG/ML IJ SOLN
10.0000 mg | Freq: Once | INTRAMUSCULAR | Status: AC
Start: 2022-11-17 — End: 2022-11-17
  Administered 2022-11-17: 10 mg via INTRAVENOUS
  Filled 2022-11-17: qty 1

## 2022-11-17 MED ORDER — PALONOSETRON HCL INJECTION 0.25 MG/5ML
0.2500 mg | Freq: Once | INTRAVENOUS | Status: AC
  Administered 2022-11-17: 0.25 mg via INTRAVENOUS
  Filled 2022-11-17: qty 5

## 2022-11-17 MED ORDER — HEPARIN SOD (PORK) LOCK FLUSH 100 UNIT/ML IV SOLN
500.0000 [IU] | Freq: Once | INTRAVENOUS | Status: AC | PRN
Start: 2022-11-17 — End: 2022-11-17
  Administered 2022-11-17: 500 [IU]

## 2022-11-17 NOTE — Progress Notes (Signed)
[facility] OFFICE PROGRESS NOTE  Patient Care Team: Carver Fila, MD as PCP - General (Gynecologic Oncology) Hannah Beat, MD as Consulting Physician [facility])  ASSESSMENT & PLAN:  Carcinosarcoma of body of uterus [facility]) I recommend minimum 3 cycles of therapy before repeating imaging study She will be due for repeat echocardiogram  I will order CT imaging before her next visit  Anemia in chronic kidney disease Her blood count is stable She is not symptomatic Observe only  CKD (chronic kidney disease), stage III (HCC) She has history of intermittent kidney failure We will monitor that closely  Orders Placed This Encounter  Procedures   CT ABDOMEN PELVIS W CONTRAST    Standing Status:   Future    Standing Expiration Date:   11/17/2023    Scheduling Instructions:     NO oral contrast    Order Specific Question:   If indicated for the ordered procedure, I authorize the administration of contrast media per Radiology protocol    Answer:   Yes    Order Specific Question:   Does the patient have a contrast media/X-ray dye allergy?    Answer:   No    Order Specific Question:   Preferred imaging location?    Answer:   [facility]    Order Specific Question:   If indicated for the ordered procedure, I authorize the administration of oral contrast media per Radiology protocol    Answer:   Yes   ECHOCARDIOGRAM COMPLETE    Standing Status:   Future    Standing Expiration Date:   11/17/2023    Order Specific Question:   Where should this test be performed    Answer:   [facility]    Order Specific Question:   Perflutren DEFINITY (image enhancing agent) should be administered unless hypersensitivity or allergy exist    Answer:   Administer Perflutren    Order Specific Question:   Reason for exam-Echo    Answer:   Chemo  Z09    All questions were answered. The patient knows to call the clinic with any problems, questions or concerns. The total  time spent in the appointment was 30 minutes encounter with patients including review of chart and various tests results, discussions about plan of care and coordination of care plan   Artis Delay, MD 11/17/2022 12:39 PM  INTERVAL HISTORY: Please see below for problem oriented charting. she returns for cycle 3 of treatment She tolerated last cycle therapy well Denies diarrhea or nausea No abdominal pain We discussed timing of echocardiogram and imaging study  REVIEW OF SYSTEMS:   Constitutional: Denies fevers, chills or abnormal weight loss Eyes: Denies blurriness of vision Ears, nose, mouth, throat, and face: Denies mucositis or sore throat Respiratory: Denies cough, dyspnea or wheezes Cardiovascular: Denies palpitation, chest discomfort or lower extremity swelling Gastrointestinal:  Denies nausea, heartburn or change in bowel habits Skin: Denies abnormal skin rashes Lymphatics: Denies new lymphadenopathy or easy bruising Neurological:Denies numbness, tingling or new weaknesses Behavioral/Psych: Mood is stable, no new changes  All other systems were reviewed with the patient and are negative.  I have reviewed the past medical history, past surgical history, social history and family history with the patient and they are unchanged from previous note.  ALLERGIES:  is allergic to tylenol [acetaminophen] and latex.  MEDICATIONS:  Current Outpatient Medications  Medication Sig Dispense Refill   Cyanocobalamin (VITAMIN B 12 PO) Take 1 tablet by mouth.  escitalopram (LEXAPRO) 20 MG tablet Take 1 tablet (20 mg total) by mouth daily. 90 tablet 0   Magnesium 200 MG TABS Take 2 tablets by mouth daily.     ondansetron (ZOFRAN) 8 MG tablet Take 1 tablet (8 mg total) by mouth every 8 (eight) hours as needed for nausea or vomiting. Start on the third day after chemotherapy. 30 tablet 1   pantoprazole (PROTONIX) 40 MG tablet Take 1 tablet (40 mg total) by mouth daily.     prochlorperazine  (COMPAZINE) 10 MG tablet Take 1 tablet (10 mg total) by mouth every 6 (six) hours as needed for nausea or vomiting. 30 tablet 1   traMADol (ULTRAM) 50 MG tablet Take 1 tablet (50 mg total) by mouth every 6 (six) hours as needed. 60 tablet 0   VITAMIN D, CHOLECALCIFEROL, PO Take 1 tablet by mouth daily.     No current facility-administered medications for this visit.   Facility-Administered Medications Ordered in Other Visits  Medication Dose Route Frequency Provider Last Rate Last Admin   fam-trastuzumab deruxtecan-nxki (ENHERTU) 360 mg in dextrose 5 % 100 mL chemo infusion  5.4 mg/kg (Treatment Plan Recorded) Intravenous Once Bertis Ruddy, Regis Wiland, MD       heparin lock flush 100 unit/mL  500 Units Intracatheter Once PRN Bertis Ruddy, Kapena Hamme, MD       sodium chloride flush (NS) 0.9 % injection 10 mL  10 mL Intracatheter PRN Bertis Ruddy, Ebony Yorio, MD        SUMMARY OF ONCOLOGIC HISTORY: Oncology History Overview Note  MMR normal MSI stable PD-L1 CPS: 1% Her2 positive P53 + Carcinosarcoma Neg genetics ER negative   Carcinosarcoma of body of uterus (HCC)  09/23/2021 Initial Diagnosis   She presented with PMB   09/28/2021 Imaging   US pelvis Enlarged uterus with fibroids. Pelvic sonogram is otherwise unremarkable.    11/11/2021 Pathology Results   A. CERVIX, MASS, EXCISION:  - HIGH-GRADE UNDIFFERENTIATED MALIGNANCY WITH EXTENSIVE NECROSIS (SEE COMMENT).   COMMENT:  The tumor consists of mostly high-grade sarcomatoid morphology with focal epithelioid morphology.  Immunohistochemical stains are performed on block A4.  The tumor cells are positive for desmin and p16 while negative for CD45, CK7, CK20, chromogranin, synaptophysin, Melan-A, p40, AE1/AE3, SMA, p63, cyclin D1, S100 and smooth muscle myosin.  Ki-67 proliferation index is high.  CD10 staining shows focal nonspecific staining.  The differential diagnosis includes a high-grade sarcoma and a partially sampled carcinosarcoma.    11/25/2021 Imaging   MRI  pelvis Marked distention of the entire endometrial cavity by heterogeneously enhancing soft tissue density, which extends through the endocervical canal into the lower vagina. This is highly suspicious for endometrial carcinoma.   Diffuse myometrial thinning due to marked distention of endometrial cavity limits evaluation; deep myometrial invasion cannot be excluded in the uterine fundus.   No evidence of extra-uterine extension of tumor.   Lymphadenopathy in lower abdominal retroperitoneum, bilateral iliac chains, and sigmoid mesocolon, consistent with metastatic disease.   Sigmoid diverticulosis. No radiographic evidence of diverticulitis.   11/26/2021 Initial Diagnosis   Uterine cancer (HCC)   11/26/2021 Cancer Staging   Staging form: Corpus Uteri - Carcinoma and Carcinosarcoma, AJCC 8th Edition - Clinical stage from 11/26/2021: FIGO Stage IIIC2 (cT3, cN2, cM0) - Signed by Artis Delay, MD on 11/26/2021 Stage prefix: Initial diagnosis Histologic grade (G): G3 Histologic grading system: 3 grade system   11/30/2021 Pathology Results   FINAL MICROSCOPIC DIAGNOSIS:  A. UTERINE CONTENTS, BIOPSY: - HIGH-GRADE UNDIFFERENTIATED MALIGNANCY WITH EXTENSIVE NECROSIS (SEE COMMENT).  COMMENT: The patient's history of a high-grade undifferentiated malignancy with extensive necrosis of the cervix is noted.  The morphological features of the current tumor cells are similar to the tumor cells seen in the previous cervical mass excision.  Immunohistochemical staining for desmin and cytokeratin AE1/AE3 is performed on block A1.  The tumor cells are diffusely positive for desmin.   AE1/AE3 stain is focally positive, however this is scant and of unclear clinical significance. Given the small quantity of tissue in the current biopsy, a definitive distinction between a high-grade sarcoma and carcinosarcoma cannot be made as this biopsy may not be representative of the entire tumor. If clinically indicated,  this case as well as the previous cervical mass excision ([id number]) may be sent out for expert consultation. Clinical correlation recommended.     12/01/2021 Tumor Marker   Patient's tumor was tested for the following markers: CA-125. Results of the tumor marker test revealed 491.   12/22/2021 Surgery   Robotic-assisted laparoscopic total hysterectomy with bilateral salpingo-oophorectomy, tumor debulking including high common iliac lymph node excision, aborted attempt of bilateral external iliac enlarged lymph nodes, mini-lap for specimen delivery, cystoscopy    12/22/2021 Pathology Results   FINAL MICROSCOPIC DIAGNOSIS:  A. LEFT CERVICAL MARGIN, EXCISION: - Positive for carcinoma  B. HIGH LEFT COMMON ILIAC LYMPH NODE, EXCISION: - Metastatic carcinosarcoma involving the lymph node  C. 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, including undifferentiated sarcoma with rhabdoid features and high-grade serous carcinoma, see comment - Tumor invades for a depth of 1 mm where myometrial thickness is 5 mm - Benign bilateral fallopian tubes and ovaries - See oncology table - See comment  ONCOLOGY TABLE:  UTERUS, CARCINOMA OR CARCINOSARCOMA: Resection  Procedure: Total hysterectomy and bilateral salpingo-oophorectomy Histologic Type: Carcinosarcoma (mixed malignant Mullerian tumor) Histologic Grade: High-grade Myometrial Invasion:      Depth of Myometrial Invasion (mm): 1 mm      Myometrial Thickness (mm): 5 mm      Percentage of Myometrial Invasion: 20% Uterine Serosa Involvement: Not identified Cervical stromal Involvement: Present Extent of involvement of other tissue/organs: Not identified Peritoneal/Ascitic Fluid: Not applicable Lymphovascular Invasion: Present Regional Lymph Nodes:      Pelvic Lymph Nodes Examined:                                  0 Sentinel                                   1 Non-sentinel                                  1 Total      Pelvic Lymph Nodes with Metastasis: 1                          Macrometastasis: (>2.0 mm): 1                          Micrometastasis: (>0.2 mm and < 2.0 mm): 0                          Isolated Tumor Cells (<0.2 mm): 0  Laterality of Lymph Node with Tumor: Left                          Extracapsular Extension: Not identified      Para-aortic Lymph Nodes Examined:                                   0 Sentinel                                   0 non-sentinel                                   0 total Distant Metastasis:      Distant Site(s) Involved: Not applicable Pathologic Stage Classification (pTNM, AJCC 8th Edition): pT1a, pN1 Ancillary Studies: MMR / MSI testing will be ordered Representative Tumor Block: C1 Comment(s): None  ([id number])       01/13/2022 Procedure   Placement of single lumen port a cath via right internal jugular vein. The catheter tip lies at the cavo-atrial junction. A power injectable port a cath was placed and is ready for immediate use.     01/19/2022 Echocardiogram    1. Left ventricular ejection fraction, by estimation, is 60 to 65%. The left ventricle has normal function. The left ventricle has no regional wall motion abnormalities. Left ventricular diastolic parameters are consistent with Grade I diastolic dysfunction (impaired relaxation). The average left ventricular global longitudinal strain is -24.0 %. The global longitudinal strain is normal.  2. Right ventricular systolic function is normal. The right ventricular size is normal. There is normal pulmonary artery systolic pressure.  3. Left atrial size was mildly dilated.  4. The mitral valve is normal in structure. No evidence of mitral valve regurgitation. No evidence of mitral stenosis.  5. The aortic valve was not well visualized. Aortic valve regurgitation is not visualized. Aortic valve sclerosis is present,  with no evidence of aortic valve stenosis.  6. The inferior vena cava is normal in size with greater than 50% respiratory variability, suggesting right atrial pressure of 3 mmHg.     01/27/2022 Tumor Marker   Patient's tumor was tested for the following markers: CA-125. Results of the tumor marker test revealed 307.   02/14/2022 - 08/22/2022 Chemotherapy   Patient is on Treatment Plan : UTERINE SEROUS CARCINOMA Carboplatin + Paclitaxel + Trastuzumab q21d x 6 Cycles / Trastuzumab q21d     02/15/2022 Tumor Marker   Patient's tumor was tested for the following markers: CA-125. Results of the tumor marker test revealed 161.   03/29/2022 Tumor Marker   Patient's tumor was tested for the following markers: CA-125. Results of the tumor marker test revealed 30.   04/13/2022 Echocardiogram   1. Left ventricular ejection fraction, by estimation, is 60 to 65%. The left ventricle has normal function. The left ventricle has no regional wall motion abnormalities. Left ventricular diastolic parameters are consistent with Grade I diastolic dysfunction (impaired relaxation). The average left ventricular global longitudinal strain is -19.4 %. The global longitudinal strain is normal.  2. Right ventricular systolic function is normal. The right ventricular size is normal. There is normal pulmonary artery systolic pressure. The estimated right ventricular systolic pressure is 21.0 mmHg.  3. Left atrial size  was mild to moderately dilated.  4. The mitral valve is grossly normal. Trivial mitral valve regurgitation.  5. The aortic valve is tricuspid. Aortic valve regurgitation is not visualized. Aortic valve sclerosis is present, with no evidence of aortic valve stenosis.  6. The inferior vena cava is normal in size with greater than 50% respiratory variability, suggesting right atrial pressure of 3 mmHg.   06/01/2022 Tumor Marker   Patient's tumor was tested for the following markers: CA-125. Results of the tumor  marker test revealed 8.   06/05/2022 Genetic Testing   Negative genetic testing on the Multi-Cancer gene panel + RNA.  The report date is Jun 05, 2022.  The Multi-Cancer + RNA Panel offered by [facility] includes sequencing and/or deletion/duplication analysis of the following 70 genes:  AIP*, ALK, APC*, ATM*, AXIN2*, BAP1*, BARD1*, BLM*, BMPR1A*, BRCA1*, BRCA2*, BRIP1*, CDC73*, CDH1*, CDK4, CDKN1B*, CDKN2A, CHEK2*, CTNNA1*, DICER1*, EPCAM (del/dup only), EGFR, FH*, FLCN*, GREM1 (promoter dup only), HOXB13, KIT, LZTR1, MAX*, MBD4, MEN1*, MET, MITF, MLH1*, MSH2*, MSH3*, MSH6*, MUTYH*, NF1*, NF2*, NTHL1*, PALB2*, PDGFRA, PMS2*, POLD1*, POLE*, POT1*, PRKAR1A*, PTCH1*, PTEN*, RAD51C*, RAD51D*, RB1*, RET, SDHA* (sequencing only), SDHAF2*, SDHB*, SDHC*, SDHD*, SMAD4*, SMARCA4*, SMARCB1*, SMARCE1*, STK11*, SUFU*, TMEM127*, TP53*, TSC1*, TSC2*, VHL*. RNA analysis is performed for * genes.    06/20/2022 Imaging   1. Enlarged periaortic and iliac lymph nodes consistent metastatic disease. Bulky iliac node on the LEFT. 2. No evidence of peritoneal metastasis. 3. Post hysterectomy and oophorectomy.   07/13/2022 Tumor Marker   Patient's tumor was tested for the following markers: CA-125. Results of the tumor marker test revealed 8.8.   07/27/2022 Echocardiogram   ECHOCARDIOGRAM COMPLETE  Result Date: 07/27/2022    ECHOCARDIOGRAM REPORT   Patient Name:   Kayla Mcgrath Date of Exam: 07/27/2022 Medical Rec #:  [id number]      Height:       59.5 in Accession #:    [id number]     Weight:       140.4 lb Date of Birth:  05-13-1945      BSA:          1.596 m Patient Age:    76 years       BP:           135/92 mmHg Patient Gender: F              HR:           69 bpm. Exam Location:  Outpatient Procedure: 2D Echo, Color Doppler, Cardiac Doppler and Strain Analysis Indications:    Chemo  History:        Patient has prior history of Echocardiogram examinations, most                 recent 04/13/2022. Breast Cancer, Uterine Cancer,  CKD; Risk                 Factors:Dyslipidemia.  Sonographer:    Milbert Coulter Referring Phys: [id number] Jalan Fariss  Sonographer Comments: Global longitudinal strain was attempted. IMPRESSIONS  1. Prior GLS was -19.4 on 04/13/22 suggest f/u echo in 3 months to make sure not seeing early myocardial issues . Left ventricular ejection fraction, by estimation, is 55 to 60%. The left ventricle has normal function. The left ventricle has no regional wall motion abnormalities. Left ventricular diastolic parameters were normal. The average left ventricular global longitudinal strain is -15.9 %. The global longitudinal strain is normal.  2. Right ventricular systolic function is normal. The  right ventricular size is normal.  3. The mitral valve is normal in structure. No evidence of mitral valve regurgitation. No evidence of mitral stenosis.  4. The aortic valve was not well visualized. There is mild calcification of the aortic valve. Aortic valve regurgitation is not visualized. Aortic valve sclerosis is present, with no evidence of aortic valve stenosis.  5. The inferior vena cava is normal in size with greater than 50% respiratory variability, suggesting right atrial pressure of 3 mmHg. FINDINGS  Left Ventricle: Prior GLS was -19.4 on 04/13/22 suggest f/u echo in 3 months to make sure not seeing early myocardial issues. Left ventricular ejection fraction, by estimation, is 55 to 60%. The left ventricle has normal function. The left ventricle has no regional wall motion abnormalities. The average left ventricular global longitudinal strain is -15.9 %. The global longitudinal strain is normal. The left ventricular internal cavity size was normal in size. There is no left ventricular hypertrophy. Left ventricular diastolic parameters were normal. Right Ventricle: The right ventricular size is normal. No increase in right ventricular wall thickness. Right ventricular systolic function is normal. Left Atrium: Left atrial size was  normal in size. Right Atrium: Right atrial size was normal in size. Pericardium: There is no evidence of pericardial effusion. Mitral Valve: The mitral valve is normal in structure. No evidence of mitral valve regurgitation. No evidence of mitral valve stenosis. Tricuspid Valve: The tricuspid valve is normal in structure. Tricuspid valve regurgitation is not demonstrated. No evidence of tricuspid stenosis. Aortic Valve: The aortic valve was not well visualized. There is mild calcification of the aortic valve. Aortic valve regurgitation is not visualized. Aortic valve sclerosis is present, with no evidence of aortic valve stenosis. Aortic valve mean gradient measures 4.0 mmHg. Aortic valve peak gradient measures 8.2 mmHg. Aortic valve area, by VTI measures 1.40 cm. Pulmonic Valve: The pulmonic valve was normal in structure. Pulmonic valve regurgitation is not visualized. No evidence of pulmonic stenosis. Aorta: The aortic root is normal in size and structure. Venous: The inferior vena cava is normal in size with greater than 50% respiratory variability, suggesting right atrial pressure of 3 mmHg. IAS/Shunts: No atrial level shunt detected by color flow Doppler.  LEFT VENTRICLE PLAX 2D LVIDd:         4.00 cm   Diastology LVIDs:         2.60 cm   LV e' medial:    5.77 cm/s LV PW:         0.90 cm   LV E/e' medial:  10.8 LV IVS:        0.80 cm   LV e' lateral:   13.50 cm/s LVOT diam:     1.70 cm   LV E/e' lateral: 4.6 LV SV:         41 LV SV Index:   26        2D Longitudinal Strain LVOT Area:     2.27 cm  2D Strain GLS Avg:     -15.9 %  RIGHT VENTRICLE RV Basal diam:  2.70 cm RV Mid diam:    2.10 cm RV S prime:     12.80 cm/s TAPSE (M-mode): 2.1 cm LEFT ATRIUM             Index        RIGHT ATRIUM          Index LA diam:        2.70 cm 1.69 cm/m   RA Area:  8.87 cm LA Vol (A2C):   35.4 ml 22.18 ml/m  RA Volume:   16.30 ml 10.21 ml/m LA Vol (A4C):   36.6 ml 22.93 ml/m LA Biplane Vol: 36.1 ml 22.62 ml/m  AORTIC  VALVE AV Area (Vmax):    1.38 cm AV Area (Vmean):   1.35 cm AV Area (VTI):     1.40 cm AV Vmax:           143.00 cm/s AV Vmean:          93.400 cm/s AV VTI:            0.293 m AV Peak Grad:      8.2 mmHg AV Mean Grad:      4.0 mmHg LVOT Vmax:         87.00 cm/s LVOT Vmean:        55.700 cm/s LVOT VTI:          0.180 m LVOT/AV VTI ratio: 0.62  AORTA Ao Root diam: 2.80 cm Ao Asc diam:  3.00 cm MITRAL VALVE               TRICUSPID VALVE MV Area (PHT): 3.30 cm    TR Peak grad:   21.7 mmHg MV Decel Time: 230 msec    TR Vmax:        233.00 cm/s MV E velocity: 62.10 cm/s MV A velocity: 87.80 cm/s  SHUNTS MV E/A ratio:  0.71        Systemic VTI:  0.18 m                            Systemic Diam: 1.70 cm Charlton Haws MD Electronically signed by Charlton Haws MD Signature Date/Time: 07/27/2022/10:27:37 AM    Final       08/23/2022 Tumor Marker   Patient's tumor was tested for the following markers: CA-125. Results of the tumor marker test revealed 7.2.   09/11/2022 Imaging   CT CHEST ABDOMEN PELVIS W CONTRAST  Result Date: 09/12/2022 CLINICAL DATA:  evaluate treatment response of uterine cancer/carcinosarcoma of uterus/endometrium. * Tracking Code: BO * EXAM: CT CHEST, ABDOMEN, AND PELVIS WITH CONTRAST TECHNIQUE: Multidetector CT imaging of the chest, abdomen and pelvis was performed following the standard protocol during bolus administration of intravenous contrast. RADIATION DOSE REDUCTION: This exam was performed according to the departmental dose-optimization program which includes automated exposure control, adjustment of the mA and/or kV according to patient size and/or use of iterative reconstruction technique. CONTRAST:  80mL OMNIPAQUE IOHEXOL 300 MG/ML  SOLN COMPARISON:  06/13/2022 abdominopelvic CT. Most recent chest CT 12/01/2021 FINDINGS: CT CHEST FINDINGS Cardiovascular: right Port-A-Cath terminates at the high right atrium. Aortic atherosclerosis. Normal heart size, without pericardial effusion. Lad  coronary artery calcification. No central pulmonary embolism, on this non-dedicated study. Mediastinum/Nodes: 1.4 cm right thyroid nodule. Not clinically significant; no follow-up imaging recommended (ref: J Am Coll Radiol. 2015 Feb;12(2): 143-50). No supraclavicular adenopathy. No axillary adenopathy. Right axillary node dissection. No mediastinal or hilar adenopathy. Esophageal fluid level on 23/2. Lungs/Pleura: No pleural fluid. Posterior central left upper lobe 7 x 6 mm nodule on 42/6 is similar to on image 46 of the prior. Musculoskeletal: Right breast implant. No acute osseous abnormality. CT ABDOMEN PELVIS FINDINGS Hepatobiliary: There may be a tiny cyst in segment 4A anteriorly. Normal gallbladder, without biliary ductal dilatation. Pancreas: Normal, without mass or ductal dilatation. Spleen: Normal in size, without focal abnormality. Adrenals/Urinary Tract: Normal adrenal glands. Normal  kidneys, without hydronephrosis. Normal urinary bladder. Stomach/Bowel: Apparent proximal gastric wall thickening including on 43/2 is most likely due to underdistention. Normal distal stomach. Extensive colonic diverticulosis. Normal terminal ileum. Normal small bowel. Vascular/Lymphatic: Aortic atherosclerosis. Index left periaortic node measures 1.2 x 1.7 cm on 71/2 versus 1.3 x 1.7 cm on the prior. A preaortic/pre caval 12 mm node on 72/2 is enlarged from 7 mm on the prior exam (when remeasured). More cephalad left periaortic node measures 1.0 cm on 60/2 versus 1.1 cm on the prior. Index right common iliac node measures 9 mm on 82/2 versus 13 mm on the prior. Right external iliac centrally necrotic node measures 1.4 cm on 95/2 versus 1.2 cm on the prior. Dominant left external iliac nodal mass measures 3.1 x 3.4 cm on 92/2 versus 2.3 x 2.8 cm on the prior exam Reproductive: Hysterectomy.  No adnexal mass. Other: No significant free fluid. No evidence of omental or peritoneal disease. Musculoskeletal: Osteopenia. S shaped  thoracolumbar spine curvature. IMPRESSION: CT CHEST IMPRESSION 1. 7 x 6 mm left upper lobe pulmonary nodule is similar compared to 12/01/2021. Stability favors a benign etiology. Recommend attention on follow-up to exclude unlikely indolent primary neoplasm or isolated metastasis. 2. No thoracic adenopathy. 3. Coronary artery atherosclerosis. Aortic Atherosclerosis (ICD10-I70.0). 4. Esophageal air fluid level suggests dysmotility or gastroesophageal reflux. CT ABDOMEN AND PELVIS IMPRESSION 1. Mixed response, with overall progression of nodal metastasis. Although some nodes are smaller, the dominant left pelvic sidewall nodal mass has significantly enlarged. 2. No new sites of metastatic disease identified. Electronically Signed   By: Jeronimo Greaves M.D.   On: 09/12/2022 11:07   MM 3D SCREENING MAMMOGRAM UNILATERAL LEFT BREAST  Result Date: 09/05/2022 CLINICAL DATA:  Screening. EXAM: DIGITAL SCREENING UNILATERAL LEFT MAMMOGRAM WITH CAD AND TOMOSYNTHESIS TECHNIQUE: Left screening digital craniocaudal and mediolateral oblique mammograms were obtained. Left screening digital breast tomosynthesis was performed. The images were evaluated with computer-aided detection. COMPARISON:  Previous exam(s). ACR Breast Density Category c: The breasts are heterogeneously dense, which may obscure small masses. FINDINGS: The patient has had a right mastectomy. There are no findings suspicious for malignancy. IMPRESSION: No mammographic evidence of malignancy. A result letter of this screening mammogram will be mailed directly to the patient. RECOMMENDATION: Screening mammogram in one year.  (Code:SM-L-84M) BI-RADS CATEGORY  1: Negative. Electronically Signed   By: Ted Mcalpine M.D.   On: 09/05/2022 11:09      10/04/2022 - 10/04/2022 Chemotherapy   Patient is on Treatment Plan : Uterine Carboplatin + Paclitaxel + Bevacizumab q21d     10/04/2022 -  Chemotherapy   Patient is on Treatment Plan : BREAST METASTATIC  Fam-Trastuzumab Deruxtecan-nxki (Enhertu) (5.4) q21d       PHYSICAL EXAMINATION: ECOG PERFORMANCE STATUS: 1 - Symptomatic but completely ambulatory  Vitals:   11/17/22 1049  BP: 127/83  Pulse: 80  Resp: 18  Temp: 98.3 F (36.8 C)  SpO2: 99%   Filed Weights   11/17/22 1049  Weight: 142 lb 3.2 oz (64.5 kg)    GENERAL:alert, no distress and comfortable   LABORATORY DATA:  I have reviewed the data as listed    Component Value Date/Time   NA 138 11/17/2022 1025   NA 140 11/02/2016 1130   K 3.9 11/17/2022 1025   K 3.8 11/02/2016 1130   CL 103 11/17/2022 1025   CL 105 05/02/2012 0822   CO2 28 11/17/2022 1025   CO2 26 11/02/2016 1130   GLUCOSE 98 11/17/2022 1025  GLUCOSE 87 11/02/2016 1130   GLUCOSE 98 05/02/2012 0822   BUN 13 11/17/2022 1025   BUN 14.7 11/02/2016 1130   CREATININE 1.04 (H) 11/17/2022 1025   CREATININE 0.84 12/05/2018 1014   CREATININE 0.8 11/02/2016 1130   CALCIUM 9.1 11/17/2022 1025   CALCIUM 9.7 11/02/2016 1130   PROT 6.9 11/17/2022 1025   PROT 6.8 11/02/2016 1130   ALBUMIN 3.7 11/17/2022 1025   ALBUMIN 3.6 11/02/2016 1130   AST 23 11/17/2022 1025   AST 23 11/02/2016 1130   ALT 17 11/17/2022 1025   ALT 17 11/02/2016 1130   ALKPHOS 108 11/17/2022 1025   ALKPHOS 59 11/02/2016 1130   BILITOT 0.3 11/17/2022 1025   BILITOT 0.34 11/02/2016 1130   GFRNONAA 56 (L) 11/17/2022 1025   GFRAA >60 11/08/2017 0504   GFRAA >60 07/25/2017 1425    No results found for: "SPEP", "UPEP"  Lab Results  Component Value Date   WBC 4.5 11/17/2022   NEUTROABS 2.2 11/17/2022   HGB 11.3 (L) 11/17/2022   HCT 33.6 (L) 11/17/2022   MCV 94.1 11/17/2022   PLT 334 11/17/2022      Chemistry      Component Value Date/Time   NA 138 11/17/2022 1025   NA 140 11/02/2016 1130   K 3.9 11/17/2022 1025   K 3.8 11/02/2016 1130   CL 103 11/17/2022 1025   CL 105 05/02/2012 0822   CO2 28 11/17/2022 1025   CO2 26 11/02/2016 1130   BUN 13 11/17/2022 1025   BUN 14.7  11/02/2016 1130   CREATININE 1.04 (H) 11/17/2022 1025   CREATININE 0.84 12/05/2018 1014   CREATININE 0.8 11/02/2016 1130      Component Value Date/Time   CALCIUM 9.1 11/17/2022 1025   CALCIUM 9.7 11/02/2016 1130   ALKPHOS 108 11/17/2022 1025   ALKPHOS 59 11/02/2016 1130   AST 23 11/17/2022 1025   AST 23 11/02/2016 1130   ALT 17 11/17/2022 1025   ALT 17 11/02/2016 1130   BILITOT 0.3 11/17/2022 1025   BILITOT 0.34 11/02/2016 1130

## 2022-11-17 NOTE — Patient Instructions (Signed)
[facility] - A DEPT OF [facility]  Discharge Instructions: Thank you for choosing [facility] to provide your oncology and hematology care.   If you have a lab appointment with the Cancer Center, please go directly to the Cancer Center and check in at the registration area.   Wear comfortable clothing and clothing appropriate for easy access to any Portacath or PICC line.   We strive to give you quality time with your provider. You may need to reschedule your appointment if you arrive late (15 or more minutes).  Arriving late affects you and other patients whose appointments are after yours.  Also, if you miss three or more appointments without notifying the office, you may be dismissed from the clinic at the provider's discretion.      For prescription refill requests, have your pharmacy contact our office and allow 72 hours for refills to be completed.    Today you received the following chemotherapy and/or immunotherapy agents enhertu      To help prevent nausea and vomiting after your treatment, we encourage you to take your nausea medication as directed.  BELOW ARE SYMPTOMS THAT SHOULD BE REPORTED IMMEDIATELY: *FEVER GREATER THAN 100.4 F (38 C) OR HIGHER *CHILLS OR SWEATING *NAUSEA AND VOMITING THAT IS NOT CONTROLLED WITH YOUR NAUSEA MEDICATION *UNUSUAL SHORTNESS OF BREATH *UNUSUAL BRUISING OR BLEEDING *URINARY PROBLEMS (pain or burning when urinating, or frequent urination) *BOWEL PROBLEMS (unusual diarrhea, constipation, pain near the anus) TENDERNESS IN MOUTH AND THROAT WITH OR WITHOUT PRESENCE OF ULCERS (sore throat, sores in mouth, or a toothache) UNUSUAL RASH, SWELLING OR PAIN  UNUSUAL VAGINAL DISCHARGE OR ITCHING   Items with * indicate a potential emergency and should be followed up as soon as possible or go to the Emergency Department if any problems should occur.  Please show the CHEMOTHERAPY ALERT CARD or IMMUNOTHERAPY  ALERT CARD at check-in to the Emergency Department and triage nurse.  Should you have questions after your visit or need to cancel or reschedule your appointment, please contact [facility] - A DEPT OF [facility]  Dept: [phone]  and follow the prompts.  Office hours are 8:00 a.m. to 4:30 p.m. Monday - Friday. Please note that voicemails left after 4:00 p.m. may not be returned until the following business day.  We are closed weekends and major holidays. You have access to a nurse at all times for urgent questions. Please call the main number to the clinic Dept: [phone] and follow the prompts.   For any non-urgent questions, you may also contact your provider using MyChart. We now offer e-Visits for anyone 96 and older to request care online for non-urgent symptoms. For details visit [email or web address].   Also download the MyChart app! Go to the app store, search "MyChart", open the app, select [facility], and log in with your MyChart username and password.

## 2022-11-17 NOTE — Assessment & Plan Note (Signed)
She has history of intermittent kidney failure We will monitor that closely

## 2022-11-17 NOTE — Assessment & Plan Note (Signed)
I recommend minimum 3 cycles of therapy before repeating imaging study She will be due for repeat echocardiogram  I will order CT imaging before her next visit

## 2022-11-17 NOTE — Assessment & Plan Note (Signed)
Her blood count is stable She is not symptomatic Observe only

## 2022-11-17 NOTE — Progress Notes (Signed)
Patient seen by Dr. Eugene Gavia are within treatment parameters.  Labs reviewed:  Awaiting CBC and CMP lab results.   Per physician team, patient is ready for treatment and there are NO modifications to the treatment plan. Pending lab results. Okay to proceed with echo from July.

## 2022-11-18 LAB — CA 125: Cancer Antigen (CA) 125: 8.5 U/mL (ref 0.0–38.1)

## 2022-11-29 ENCOUNTER — Ambulatory Visit (HOSPITAL_COMMUNITY)
Admission: RE | Admit: 2022-11-29 | Discharge: 2022-11-29 | Disposition: A | Discharge status: Home / Self Care | Payer: Medicare HMO | Source: Ambulatory Visit | Attending: Hematology and Oncology | Admitting: Hematology and Oncology

## 2022-11-29 DIAGNOSIS — C549 Malignant neoplasm of corpus uteri, unspecified: Secondary | ICD-10-CM | POA: Insufficient documentation

## 2022-11-29 DIAGNOSIS — K769 Liver disease, unspecified: Secondary | ICD-10-CM | POA: Diagnosis not present

## 2022-11-29 DIAGNOSIS — C55 Malignant neoplasm of uterus, part unspecified: Secondary | ICD-10-CM | POA: Diagnosis not present

## 2022-11-29 MED ORDER — IOHEXOL 300 MG/ML  SOLN
100.0000 mL | Freq: Once | INTRAMUSCULAR | Status: AC | PRN
  Administered 2022-11-29: 100 mL via INTRAVENOUS

## 2022-11-29 MED ORDER — HEPARIN SOD (PORK) LOCK FLUSH 100 UNIT/ML IV SOLN
INTRAVENOUS | Status: AC
Start: 2022-11-29 — End: ?
  Filled 2022-11-29: qty 5

## 2022-11-29 MED ORDER — HEPARIN SOD (PORK) LOCK FLUSH 100 UNIT/ML IV SOLN
500.0000 [IU] | Freq: Once | INTRAVENOUS | Status: AC
  Administered 2022-11-29: 500 [IU] via INTRAVENOUS

## 2022-12-05 ENCOUNTER — Ambulatory Visit (HOSPITAL_COMMUNITY)
Admission: RE | Admit: 2022-12-05 | Discharge: 2022-12-05 | Disposition: A | Discharge status: Home / Self Care | Payer: Medicare HMO | Source: Ambulatory Visit | Attending: Hematology and Oncology

## 2022-12-05 DIAGNOSIS — R7881 Bacteremia: Secondary | ICD-10-CM | POA: Insufficient documentation

## 2022-12-05 DIAGNOSIS — E785 Hyperlipidemia, unspecified: Secondary | ICD-10-CM | POA: Diagnosis not present

## 2022-12-05 DIAGNOSIS — Z0189 Encounter for other specified special examinations: Secondary | ICD-10-CM

## 2022-12-05 DIAGNOSIS — C50919 Malignant neoplasm of unspecified site of unspecified female breast: Secondary | ICD-10-CM | POA: Insufficient documentation

## 2022-12-05 DIAGNOSIS — Z5111 Encounter for antineoplastic chemotherapy: Secondary | ICD-10-CM | POA: Diagnosis not present

## 2022-12-05 DIAGNOSIS — C549 Malignant neoplasm of corpus uteri, unspecified: Secondary | ICD-10-CM | POA: Diagnosis not present

## 2022-12-05 LAB — ECHOCARDIOGRAM COMPLETE
Area-P 1/2: 3.88 cm2
Calc EF: 64.9 %
S' Lateral: 2.8 cm
Single Plane A2C EF: 64.8 %
Single Plane A4C EF: 61.7 %

## 2022-12-05 MED FILL — Fosaprepitant Dimeglumine For IV Infusion 150 MG (Base Eq): INTRAVENOUS | Qty: 5 | Status: AC

## 2022-12-05 NOTE — Progress Notes (Signed)
  Echocardiogram 2D Echocardiogram has been performed.  Janalyn Harder 12/05/2022, 11:42 AM

## 2022-12-06 ENCOUNTER — Inpatient Hospital Stay: Payer: Medicare HMO

## 2022-12-06 ENCOUNTER — Encounter: Payer: Self-pay | Admitting: Hematology and Oncology

## 2022-12-06 ENCOUNTER — Inpatient Hospital Stay: Payer: Medicare HMO | Admitting: Hematology and Oncology

## 2022-12-06 VITALS — BP 147/86 | HR 89 | Temp 98.7°F | Resp 18 | Ht 59.5 in | Wt 143.8 lb

## 2022-12-06 VITALS — BP 128/82 | HR 84 | Resp 16

## 2022-12-06 DIAGNOSIS — N183 Chronic kidney disease, stage 3 unspecified: Secondary | ICD-10-CM | POA: Diagnosis not present

## 2022-12-06 DIAGNOSIS — C549 Malignant neoplasm of corpus uteri, unspecified: Secondary | ICD-10-CM

## 2022-12-06 DIAGNOSIS — D61818 Other pancytopenia: Secondary | ICD-10-CM

## 2022-12-06 DIAGNOSIS — Z5112 Encounter for antineoplastic immunotherapy: Secondary | ICD-10-CM | POA: Diagnosis not present

## 2022-12-06 DIAGNOSIS — D631 Anemia in chronic kidney disease: Secondary | ICD-10-CM | POA: Diagnosis not present

## 2022-12-06 DIAGNOSIS — C775 Secondary and unspecified malignant neoplasm of intrapelvic lymph nodes: Secondary | ICD-10-CM | POA: Diagnosis not present

## 2022-12-06 DIAGNOSIS — C541 Malignant neoplasm of endometrium: Secondary | ICD-10-CM | POA: Diagnosis not present

## 2022-12-06 DIAGNOSIS — Z90722 Acquired absence of ovaries, bilateral: Secondary | ICD-10-CM | POA: Diagnosis not present

## 2022-12-06 DIAGNOSIS — Z90710 Acquired absence of both cervix and uterus: Secondary | ICD-10-CM | POA: Diagnosis not present

## 2022-12-06 LAB — CMP (CANCER CENTER ONLY)
ALT: 17 U/L (ref 0–44)
AST: 26 U/L (ref 15–41)
Albumin: 3.6 g/dL (ref 3.5–5.0)
Alkaline Phosphatase: 94 U/L (ref 38–126)
Anion gap: 7 (ref 5–15)
BUN: 20 mg/dL (ref 8–23)
CO2: 26 mmol/L (ref 22–32)
Calcium: 8.9 mg/dL (ref 8.9–10.3)
Chloride: 105 mmol/L (ref 98–111)
Creatinine: 1.07 mg/dL — ABNORMAL HIGH (ref 0.44–1.00)
GFR, Estimated: 54 mL/min — ABNORMAL LOW (ref >60)
Glucose, Bld: 92 mg/dL (ref 70–99)
Potassium: 3.8 mmol/L (ref 3.5–5.1)
Sodium: 138 mmol/L (ref 135–145)
Total Bilirubin: 0.3 mg/dL (ref <1.2)
Total Protein: 6.7 g/dL (ref 6.5–8.1)

## 2022-12-06 LAB — CBC WITH DIFFERENTIAL (CANCER CENTER ONLY)
Abs Immature Granulocytes: 0.01 10*3/uL (ref 0.00–0.07)
Basophils Absolute: 0 10*3/uL (ref 0.0–0.1)
Basophils Relative: 1 %
Eosinophils Absolute: 0.2 10*3/uL (ref 0.0–0.5)
Eosinophils Relative: 5 %
HCT: 32.2 % — ABNORMAL LOW (ref 36.0–46.0)
Hemoglobin: 10.6 g/dL — ABNORMAL LOW (ref 12.0–15.0)
Immature Granulocytes: 0 %
Lymphocytes Relative: 30 %
Lymphs Abs: 0.9 10*3/uL (ref 0.7–4.0)
MCH: 31.9 pg (ref 26.0–34.0)
MCHC: 32.9 g/dL (ref 30.0–36.0)
MCV: 97 fL (ref 80.0–100.0)
Monocytes Absolute: 0.7 10*3/uL (ref 0.1–1.0)
Monocytes Relative: 22 %
Neutro Abs: 1.2 10*3/uL — ABNORMAL LOW (ref 1.7–7.7)
Neutrophils Relative %: 42 %
Platelet Count: 249 10*3/uL (ref 150–400)
RBC: 3.32 MIL/uL — ABNORMAL LOW (ref 3.87–5.11)
RDW: 17.4 % — ABNORMAL HIGH (ref 11.5–15.5)
WBC Count: 2.9 10*3/uL — ABNORMAL LOW (ref 4.0–10.5)
nRBC: 0 % (ref 0.0–0.2)

## 2022-12-06 MED ORDER — FAM-TRASTUZUMAB DERUXTECAN-NXKI CHEMO 100 MG IV SOLR
5.4000 mg/kg | Freq: Once | INTRAVENOUS | Status: AC
  Administered 2022-12-06: 360 mg via INTRAVENOUS
  Filled 2022-12-06: qty 18

## 2022-12-06 MED ORDER — SODIUM CHLORIDE 0.9% FLUSH
10.0000 mL | Freq: Once | INTRAVENOUS | Status: AC
  Administered 2022-12-06: 10 mL

## 2022-12-06 MED ORDER — PALONOSETRON HCL INJECTION 0.25 MG/5ML
0.2500 mg | Freq: Once | INTRAVENOUS | Status: AC
  Administered 2022-12-06: 0.25 mg via INTRAVENOUS
  Filled 2022-12-06: qty 5

## 2022-12-06 MED ORDER — DEXAMETHASONE SODIUM PHOSPHATE 10 MG/ML IJ SOLN
10.0000 mg | Freq: Once | INTRAMUSCULAR | Status: AC
Start: 2022-12-06 — End: 2022-12-06
  Administered 2022-12-06: 10 mg via INTRAVENOUS
  Filled 2022-12-06: qty 1

## 2022-12-06 MED ORDER — SODIUM CHLORIDE 0.9% FLUSH
10.0000 mL | INTRAVENOUS | Status: DC | PRN
  Administered 2022-12-06: 10 mL

## 2022-12-06 MED ORDER — SODIUM CHLORIDE 0.9 % IV SOLN
150.0000 mg | Freq: Once | INTRAVENOUS | Status: AC
  Administered 2022-12-06: 150 mg via INTRAVENOUS
  Filled 2022-12-06: qty 150

## 2022-12-06 MED ORDER — HEPARIN SOD (PORK) LOCK FLUSH 100 UNIT/ML IV SOLN
500.0000 [IU] | Freq: Once | INTRAVENOUS | Status: AC | PRN
  Administered 2022-12-06: 500 [IU]

## 2022-12-06 MED ORDER — DEXTROSE 5 % IV SOLN: Freq: Once | INTRAVENOUS | Status: AC

## 2022-12-06 NOTE — Assessment & Plan Note (Signed)
She has history of intermittent kidney failure We will monitor that closely

## 2022-12-06 NOTE — Progress Notes (Signed)
[name] OFFICE PROGRESS NOTE  Patient Care Team: Carver Fila, MD as PCP - General (Gynecologic Oncology) Hannah Beat, MD as Consulting Physician [facility])  ASSESSMENT & PLAN:  Carcinosarcoma of body of uterus [name]) CT imaging results are not available but based on my review, the patient has positive response to therapy with no signs of disease progression We will continue treatment as scheduled I plan to repeat imaging study again in 3 months, due in February 2025 Her echocardiogram will also be due in February; recent echocardiogram was within normal limits  Pancytopenia, acquired [facility]) She has stable pancytopenia due to treatment She is not symptomatic We will proceed with treatment without delay  CKD (chronic kidney disease), stage III (HCC) She has history of intermittent kidney failure We will monitor that closely  No orders of the defined types were placed in this encounter.   All questions were answered. The patient knows to call the clinic with any problems, questions or concerns. The total time spent in the appointment was 40 minutes encounter with patients including review of chart and various tests results, discussions about plan of care and coordination of care plan   Artis Delay, MD 12/06/2022 12:44 PM  INTERVAL HISTORY: Please see below for problem oriented charting. she returns for chemotherapy follow-up She tolerated recent treatment well except for 1 episode of diarrhea that has since resolved She is gaining weight Otherwise, she has no side effects from treatment We reviewed recent echocardiogram and CT imaging   REVIEW OF SYSTEMS:   Constitutional: Denies fevers, chills or abnormal weight loss Eyes: Denies blurriness of vision Ears, nose, mouth, throat, and face: Denies mucositis or sore throat Respiratory: Denies cough, dyspnea or wheezes Cardiovascular: Denies palpitation, chest discomfort or lower extremity  swelling Skin: Denies abnormal skin rashes Lymphatics: Denies new lymphadenopathy or easy bruising Neurological:Denies numbness, tingling or new weaknesses Behavioral/Psych: Mood is stable, no new changes  All other systems were reviewed with the patient and are negative.  I have reviewed the past medical history, past surgical history, social history and family history with the patient and they are unchanged from previous note.  ALLERGIES:  is allergic to tylenol [acetaminophen] and latex.  MEDICATIONS:  Current Outpatient Medications  Medication Sig Dispense Refill   Cyanocobalamin (VITAMIN B 12 PO) Take 1 tablet by mouth.     escitalopram (LEXAPRO) 20 MG tablet Take 1 tablet (20 mg total) by mouth daily. 90 tablet 0   Magnesium 200 MG TABS Take 2 tablets by mouth daily.     ondansetron (ZOFRAN) 8 MG tablet Take 1 tablet (8 mg total) by mouth every 8 (eight) hours as needed for nausea or vomiting. Start on the third day after chemotherapy. 30 tablet 1   pantoprazole (PROTONIX) 40 MG tablet Take 1 tablet (40 mg total) by mouth daily.     prochlorperazine (COMPAZINE) 10 MG tablet Take 1 tablet (10 mg total) by mouth every 6 (six) hours as needed for nausea or vomiting. 30 tablet 1   traMADol (ULTRAM) 50 MG tablet Take 1 tablet (50 mg total) by mouth every 6 (six) hours as needed. 60 tablet 0   VITAMIN D, CHOLECALCIFEROL, PO Take 1 tablet by mouth daily.     No current facility-administered medications for this visit.    SUMMARY OF ONCOLOGIC HISTORY: Oncology History Overview Note  MMR normal MSI stable PD-L1 CPS: 1% Her2 positive P53 + Carcinosarcoma Neg genetics ER negative   Carcinosarcoma of body of uterus (  HCC)  09/23/2021 Initial Diagnosis   She presented with PMB   09/28/2021 Imaging   US pelvis Enlarged uterus with fibroids. Pelvic sonogram is otherwise unremarkable.    11/11/2021 Pathology Results   A. CERVIX, MASS, EXCISION:  - HIGH-GRADE UNDIFFERENTIATED  MALIGNANCY WITH EXTENSIVE NECROSIS (SEE COMMENT).   COMMENT:  The tumor consists of mostly high-grade sarcomatoid morphology with focal epithelioid morphology.  Immunohistochemical stains are performed on block A4.  The tumor cells are positive for desmin and p16 while negative for CD45, CK7, CK20, chromogranin, synaptophysin, Melan-A, p40, AE1/AE3, SMA, p63, cyclin D1, S100 and smooth muscle myosin.  Ki-67 proliferation index is high.  CD10 staining shows focal nonspecific staining.  The differential diagnosis includes a high-grade sarcoma and a partially sampled carcinosarcoma.    11/25/2021 Imaging   MRI pelvis Marked distention of the entire endometrial cavity by heterogeneously enhancing soft tissue density, which extends through the endocervical canal into the lower vagina. This is highly suspicious for endometrial carcinoma.   Diffuse myometrial thinning due to marked distention of endometrial cavity limits evaluation; deep myometrial invasion cannot be excluded in the uterine fundus.   No evidence of extra-uterine extension of tumor.   Lymphadenopathy in lower abdominal retroperitoneum, bilateral iliac chains, and sigmoid mesocolon, consistent with metastatic disease.   Sigmoid diverticulosis. No radiographic evidence of diverticulitis.   11/26/2021 Initial Diagnosis   Uterine cancer (HCC)   11/26/2021 Cancer Staging   Staging form: Corpus Uteri - Carcinoma and Carcinosarcoma, AJCC 8th Edition - Clinical stage from 11/26/2021: FIGO Stage IIIC2 (cT3, cN2, cM0) - Signed by Artis Delay, MD on 11/26/2021 Stage prefix: Initial diagnosis Histologic grade (G): G3 Histologic grading system: 3 grade system   11/30/2021 Pathology Results   FINAL MICROSCOPIC DIAGNOSIS:  A. UTERINE CONTENTS, BIOPSY: - HIGH-GRADE UNDIFFERENTIATED MALIGNANCY WITH EXTENSIVE NECROSIS (SEE COMMENT).  COMMENT: The patient's history of a high-grade undifferentiated malignancy with extensive necrosis of the  cervix is noted.  The morphological features of the current tumor cells are similar to the tumor cells seen in the previous cervical mass excision.  Immunohistochemical staining for desmin and cytokeratin AE1/AE3 is performed on block A1.  The tumor cells are diffusely positive for desmin.   AE1/AE3 stain is focally positive, however this is scant and of unclear clinical significance. Given the small quantity of tissue in the current biopsy, a definitive distinction between a high-grade sarcoma and carcinosarcoma cannot be made as this biopsy may not be representative of the entire tumor. If clinically indicated, this case as well as the previous cervical mass excision ([id number]) may be sent out for expert consultation. Clinical correlation recommended.     12/01/2021 Tumor Marker   Patient's tumor was tested for the following markers: CA-125. Results of the tumor marker test revealed 491.   12/22/2021 Surgery   Robotic-assisted laparoscopic total hysterectomy with bilateral salpingo-oophorectomy, tumor debulking including high common iliac lymph node excision, aborted attempt of bilateral external iliac enlarged lymph nodes, mini-lap for specimen delivery, cystoscopy    12/22/2021 Pathology Results   FINAL MICROSCOPIC DIAGNOSIS:  A. LEFT CERVICAL MARGIN, EXCISION: - Positive for carcinoma  B. HIGH LEFT COMMON ILIAC LYMPH NODE, EXCISION: - Metastatic carcinosarcoma involving the lymph node  C. 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, including undifferentiated sarcoma with rhabdoid features and high-grade serous carcinoma, see comment - Tumor invades for a depth of 1 mm where myometrial thickness is 5 mm - Benign bilateral fallopian tubes and  ovaries - See oncology table - See comment  ONCOLOGY TABLE:  UTERUS, CARCINOMA OR CARCINOSARCOMA: Resection  Procedure: Total hysterectomy and bilateral  salpingo-oophorectomy Histologic Type: Carcinosarcoma (mixed malignant Mullerian tumor) Histologic Grade: High-grade Myometrial Invasion:      Depth of Myometrial Invasion (mm): 1 mm      Myometrial Thickness (mm): 5 mm      Percentage of Myometrial Invasion: 20% Uterine Serosa Involvement: Not identified Cervical stromal Involvement: Present Extent of involvement of other tissue/organs: Not identified Peritoneal/Ascitic Fluid: Not applicable Lymphovascular Invasion: Present Regional Lymph Nodes:      Pelvic Lymph Nodes Examined:                                  0 Sentinel                                  1 Non-sentinel                                  1 Total      Pelvic Lymph Nodes with Metastasis: 1                          Macrometastasis: (>2.0 mm): 1                          Micrometastasis: (>0.2 mm and < 2.0 mm): 0                          Isolated Tumor Cells (<0.2 mm): 0                          Laterality of Lymph Node with Tumor: Left                          Extracapsular Extension: Not identified      Para-aortic Lymph Nodes Examined:                                   0 Sentinel                                   0 non-sentinel                                   0 total Distant Metastasis:      Distant Site(s) Involved: Not applicable Pathologic Stage Classification (pTNM, AJCC 8th Edition): pT1a, pN1 Ancillary Studies: MMR / MSI testing will be ordered Representative Tumor Block: C1 Comment(s): None  ([id number])       01/13/2022 Procedure   Placement of single lumen port a cath via right internal jugular vein. The catheter tip lies at the cavo-atrial junction. A power injectable port a cath was placed and is ready for immediate use.     01/19/2022 Echocardiogram    1. Left ventricular ejection fraction, by estimation, is 60 to 65%. The left ventricle has normal function.  The left ventricle has no regional wall motion abnormalities. Left ventricular diastolic  parameters are consistent with Grade I diastolic dysfunction (impaired relaxation). The average left ventricular global longitudinal strain is -24.0 %. The global longitudinal strain is normal.  2. Right ventricular systolic function is normal. The right ventricular size is normal. There is normal pulmonary artery systolic pressure.  3. Left atrial size was mildly dilated.  4. The mitral valve is normal in structure. No evidence of mitral valve regurgitation. No evidence of mitral stenosis.  5. The aortic valve was not well visualized. Aortic valve regurgitation is not visualized. Aortic valve sclerosis is present, with no evidence of aortic valve stenosis.  6. The inferior vena cava is normal in size with greater than 50% respiratory variability, suggesting right atrial pressure of 3 mmHg.     01/27/2022 Tumor Marker   Patient's tumor was tested for the following markers: CA-125. Results of the tumor marker test revealed 307.   02/14/2022 - 08/22/2022 Chemotherapy   Patient is on Treatment Plan : UTERINE SEROUS CARCINOMA Carboplatin + Paclitaxel + Trastuzumab q21d x 6 Cycles / Trastuzumab q21d     02/15/2022 Tumor Marker   Patient's tumor was tested for the following markers: CA-125. Results of the tumor marker test revealed 161.   03/29/2022 Tumor Marker   Patient's tumor was tested for the following markers: CA-125. Results of the tumor marker test revealed 30.   04/13/2022 Echocardiogram   1. Left ventricular ejection fraction, by estimation, is 60 to 65%. The left ventricle has normal function. The left ventricle has no regional wall motion abnormalities. Left ventricular diastolic parameters are consistent with Grade I diastolic dysfunction (impaired relaxation). The average left ventricular global longitudinal strain is -19.4 %. The global longitudinal strain is normal.  2. Right ventricular systolic function is normal. The right ventricular size is normal. There is normal pulmonary artery  systolic pressure. The estimated right ventricular systolic pressure is 21.0 mmHg.  3. Left atrial size was mild to moderately dilated.  4. The mitral valve is grossly normal. Trivial mitral valve regurgitation.  5. The aortic valve is tricuspid. Aortic valve regurgitation is not visualized. Aortic valve sclerosis is present, with no evidence of aortic valve stenosis.  6. The inferior vena cava is normal in size with greater than 50% respiratory variability, suggesting right atrial pressure of 3 mmHg.   06/01/2022 Tumor Marker   Patient's tumor was tested for the following markers: CA-125. Results of the tumor marker test revealed 8.   06/05/2022 Genetic Testing   Negative genetic testing on the Multi-Cancer gene panel + RNA.  The report date is Jun 05, 2022.  The Multi-Cancer + RNA Panel offered by [facility] includes sequencing and/or deletion/duplication analysis of the following 70 genes:  AIP*, ALK, APC*, ATM*, AXIN2*, BAP1*, BARD1*, BLM*, BMPR1A*, BRCA1*, BRCA2*, BRIP1*, CDC73*, CDH1*, CDK4, CDKN1B*, CDKN2A, CHEK2*, CTNNA1*, DICER1*, EPCAM (del/dup only), EGFR, FH*, FLCN*, GREM1 (promoter dup only), HOXB13, KIT, LZTR1, MAX*, MBD4, MEN1*, MET, MITF, MLH1*, MSH2*, MSH3*, MSH6*, MUTYH*, NF1*, NF2*, NTHL1*, PALB2*, PDGFRA, PMS2*, POLD1*, POLE*, POT1*, PRKAR1A*, PTCH1*, PTEN*, RAD51C*, RAD51D*, RB1*, RET, SDHA* (sequencing only), SDHAF2*, SDHB*, SDHC*, SDHD*, SMAD4*, SMARCA4*, SMARCB1*, SMARCE1*, STK11*, SUFU*, TMEM127*, TP53*, TSC1*, TSC2*, VHL*. RNA analysis is performed for * genes.    06/20/2022 Imaging   1. Enlarged periaortic and iliac lymph nodes consistent metastatic disease. Bulky iliac node on the LEFT. 2. No evidence of peritoneal metastasis. 3. Post hysterectomy and oophorectomy.   07/13/2022 Tumor Marker   Patient's  tumor was tested for the following markers: CA-125. Results of the tumor marker test revealed 8.8.   07/27/2022 Echocardiogram   ECHOCARDIOGRAM COMPLETE  Result Date:  07/27/2022    ECHOCARDIOGRAM REPORT   Patient Name:   [facility] Date of Exam: 07/27/2022 Medical Rec #:  [id number]      Height:       59.5 in Accession #:    [id number]     Weight:       140.4 lb Date of Birth:  1945-08-26      BSA:          1.596 m Patient Age:    76 years       BP:           135/92 mmHg Patient Gender: F              HR:           69 bpm. Exam Location:  Outpatient Procedure: 2D Echo, Color Doppler, Cardiac Doppler and Strain Analysis Indications:    Chemo  History:        Patient has prior history of Echocardiogram examinations, most                 recent 04/13/2022. Breast Cancer, Uterine Cancer, CKD; Risk                 Factors:Dyslipidemia.  Sonographer:    Milbert Coulter Referring Phys: [id number] Gizella [place]  Sonographer Comments: Global longitudinal strain was attempted. IMPRESSIONS  1. Prior GLS was -19.4 on 04/13/22 suggest f/u echo in 3 months to make sure not seeing early myocardial issues . Left ventricular ejection fraction, by estimation, is 55 to 60%. The left ventricle has normal function. The left ventricle has no regional wall motion abnormalities. Left ventricular diastolic parameters were normal. The average left ventricular global longitudinal strain is -15.9 %. The global longitudinal strain is normal.  2. Right ventricular systolic function is normal. The right ventricular size is normal.  3. The mitral valve is normal in structure. No evidence of mitral valve regurgitation. No evidence of mitral stenosis.  4. The aortic valve was not well visualized. There is mild calcification of the aortic valve. Aortic valve regurgitation is not visualized. Aortic valve sclerosis is present, with no evidence of aortic valve stenosis.  5. The inferior vena cava is normal in size with greater than 50% respiratory variability, suggesting right atrial pressure of 3 mmHg. FINDINGS  Left Ventricle: Prior GLS was -19.4 on 04/13/22 suggest f/u echo in 3 months to make sure not seeing early myocardial  issues. Left ventricular ejection fraction, by estimation, is 55 to 60%. The left ventricle has normal function. The left ventricle has no regional wall motion abnormalities. The average left ventricular global longitudinal strain is -15.9 %. The global longitudinal strain is normal. The left ventricular internal cavity size was normal in size. There is no left ventricular hypertrophy. Left ventricular diastolic parameters were normal. Right Ventricle: The right ventricular size is normal. No increase in right ventricular wall thickness. Right ventricular systolic function is normal. Left Atrium: Left atrial size was normal in size. Right Atrium: Right atrial size was normal in size. Pericardium: There is no evidence of pericardial effusion. Mitral Valve: The mitral valve is normal in structure. No evidence of mitral valve regurgitation. No evidence of mitral valve stenosis. Tricuspid Valve: The tricuspid valve is normal in structure. Tricuspid valve regurgitation is not demonstrated. No evidence of tricuspid stenosis. Aortic Valve:  The aortic valve was not well visualized. There is mild calcification of the aortic valve. Aortic valve regurgitation is not visualized. Aortic valve sclerosis is present, with no evidence of aortic valve stenosis. Aortic valve mean gradient measures 4.0 mmHg. Aortic valve peak gradient measures 8.2 mmHg. Aortic valve area, by VTI measures 1.40 cm. Pulmonic Valve: The pulmonic valve was normal in structure. Pulmonic valve regurgitation is not visualized. No evidence of pulmonic stenosis. Aorta: The aortic root is normal in size and structure. Venous: The inferior vena cava is normal in size with greater than 50% respiratory variability, suggesting right atrial pressure of 3 mmHg. IAS/Shunts: No atrial level shunt detected by color flow Doppler.  LEFT VENTRICLE PLAX 2D LVIDd:         4.00 cm   Diastology LVIDs:         2.60 cm   LV e' medial:    5.77 cm/s LV PW:         0.90 cm   LV E/e'  medial:  10.8 LV IVS:        0.80 cm   LV e' lateral:   13.50 cm/s LVOT diam:     1.70 cm   LV E/e' lateral: 4.6 LV SV:         41 LV SV Index:   26        2D Longitudinal Strain LVOT Area:     2.27 cm  2D Strain GLS Avg:     -15.9 %  RIGHT VENTRICLE RV Basal diam:  2.70 cm RV Mid diam:    2.10 cm RV S prime:     12.80 cm/s TAPSE (M-mode): 2.1 cm LEFT ATRIUM             Index        RIGHT ATRIUM          Index LA diam:        2.70 cm 1.69 cm/m   RA Area:     8.87 cm LA Vol (A2C):   35.4 ml 22.18 ml/m  RA Volume:   16.30 ml 10.21 ml/m LA Vol (A4C):   36.6 ml 22.93 ml/m LA Biplane Vol: 36.1 ml 22.62 ml/m  AORTIC VALVE AV Area (Vmax):    1.38 cm AV Area (Vmean):   1.35 cm AV Area (VTI):     1.40 cm AV Vmax:           143.00 cm/s AV Vmean:          93.400 cm/s AV VTI:            0.293 m AV Peak Grad:      8.2 mmHg AV Mean Grad:      4.0 mmHg LVOT Vmax:         87.00 cm/s LVOT Vmean:        55.700 cm/s LVOT VTI:          0.180 m LVOT/AV VTI ratio: 0.62  AORTA Ao Root diam: 2.80 cm Ao Asc diam:  3.00 cm MITRAL VALVE               TRICUSPID VALVE MV Area (PHT): 3.30 cm    TR Peak grad:   21.7 mmHg MV Decel Time: 230 msec    TR Vmax:        233.00 cm/s MV E velocity: 62.10 cm/s MV A velocity: 87.80 cm/s  SHUNTS MV E/A ratio:  0.71        Systemic VTI:  0.18  m                            Systemic Diam: 1.70 cm Charlton Haws MD Electronically signed by Charlton Haws MD Signature Date/Time: 07/27/2022/10:27:37 AM    Final       08/23/2022 Tumor Marker   Patient's tumor was tested for the following markers: CA-125. Results of the tumor marker test revealed 7.2.   09/11/2022 Imaging   CT CHEST ABDOMEN PELVIS W CONTRAST  Result Date: 09/12/2022 CLINICAL DATA:  evaluate treatment response of uterine cancer/carcinosarcoma of uterus/endometrium. * Tracking Code: BO * EXAM: CT CHEST, ABDOMEN, AND PELVIS WITH CONTRAST TECHNIQUE: Multidetector CT imaging of the chest, abdomen and pelvis was performed following the standard  protocol during bolus administration of intravenous contrast. RADIATION DOSE REDUCTION: This exam was performed according to the departmental dose-optimization program which includes automated exposure control, adjustment of the mA and/or kV according to patient size and/or use of iterative reconstruction technique. CONTRAST:  80mL OMNIPAQUE IOHEXOL 300 MG/ML  SOLN COMPARISON:  06/13/2022 abdominopelvic CT. Most recent chest CT 12/01/2021 FINDINGS: CT CHEST FINDINGS Cardiovascular: right Port-A-Cath terminates at the high right atrium. Aortic atherosclerosis. Normal heart size, without pericardial effusion. Lad coronary artery calcification. No central pulmonary embolism, on this non-dedicated study. Mediastinum/Nodes: 1.4 cm right thyroid nodule. Not clinically significant; no follow-up imaging recommended (ref: J Am Coll Radiol. 2015 Feb;12(2): 143-50). No supraclavicular adenopathy. No axillary adenopathy. Right axillary node dissection. No mediastinal or hilar adenopathy. Esophageal fluid level on 23/2. Lungs/Pleura: No pleural fluid. Posterior central left upper lobe 7 x 6 mm nodule on 42/6 is similar to on image 46 of the prior. Musculoskeletal: Right breast implant. No acute osseous abnormality. CT ABDOMEN PELVIS FINDINGS Hepatobiliary: There may be a tiny cyst in segment 4A anteriorly. Normal gallbladder, without biliary ductal dilatation. Pancreas: Normal, without mass or ductal dilatation. Spleen: Normal in size, without focal abnormality. Adrenals/Urinary Tract: Normal adrenal glands. Normal kidneys, without hydronephrosis. Normal urinary bladder. Stomach/Bowel: Apparent proximal gastric wall thickening including on 43/2 is most likely due to underdistention. Normal distal stomach. Extensive colonic diverticulosis. Normal terminal ileum. Normal small bowel. Vascular/Lymphatic: Aortic atherosclerosis. Index left periaortic node measures 1.2 x 1.7 cm on 71/2 versus 1.3 x 1.7 cm on the prior. A preaortic/pre  caval 12 mm node on 72/2 is enlarged from 7 mm on the prior exam (when remeasured). More cephalad left periaortic node measures 1.0 cm on 60/2 versus 1.1 cm on the prior. Index right common iliac node measures 9 mm on 82/2 versus 13 mm on the prior. Right external iliac centrally necrotic node measures 1.4 cm on 95/2 versus 1.2 cm on the prior. Dominant left external iliac nodal mass measures 3.1 x 3.4 cm on 92/2 versus 2.3 x 2.8 cm on the prior exam Reproductive: Hysterectomy.  No adnexal mass. Other: No significant free fluid. No evidence of omental or peritoneal disease. Musculoskeletal: Osteopenia. S shaped thoracolumbar spine curvature. IMPRESSION: CT CHEST IMPRESSION 1. 7 x 6 mm left upper lobe pulmonary nodule is similar compared to 12/01/2021. Stability favors a benign etiology. Recommend attention on follow-up to exclude unlikely indolent primary neoplasm or isolated metastasis. 2. No thoracic adenopathy. 3. Coronary artery atherosclerosis. Aortic Atherosclerosis (ICD10-I70.0). 4. Esophageal air fluid level suggests dysmotility or gastroesophageal reflux. CT ABDOMEN AND PELVIS IMPRESSION 1. Mixed response, with overall progression of nodal metastasis. Although some nodes are smaller, the dominant left pelvic sidewall nodal mass has significantly enlarged. 2.  No new sites of metastatic disease identified. Electronically Signed   By: Jeronimo Greaves M.D.   On: 09/12/2022 11:07   MM 3D SCREENING MAMMOGRAM UNILATERAL LEFT BREAST  Result Date: 09/05/2022 CLINICAL DATA:  Screening. EXAM: DIGITAL SCREENING UNILATERAL LEFT MAMMOGRAM WITH CAD AND TOMOSYNTHESIS TECHNIQUE: Left screening digital craniocaudal and mediolateral oblique mammograms were obtained. Left screening digital breast tomosynthesis was performed. The images were evaluated with computer-aided detection. COMPARISON:  Previous exam(s). ACR Breast Density Category c: The breasts are heterogeneously dense, which may obscure small masses. FINDINGS: The  patient has had a right mastectomy. There are no findings suspicious for malignancy. IMPRESSION: No mammographic evidence of malignancy. A result letter of this screening mammogram will be mailed directly to the patient. RECOMMENDATION: Screening mammogram in one year.  (Code:SM-L-24M) BI-RADS CATEGORY  1: Negative. Electronically Signed   By: Ted Mcalpine M.D.   On: 09/05/2022 11:09      10/04/2022 - 10/04/2022 Chemotherapy   Patient is on Treatment Plan : Uterine Carboplatin + Paclitaxel + Bevacizumab q21d     10/04/2022 -  Chemotherapy   Patient is on Treatment Plan : BREAST METASTATIC Fam-Trastuzumab Deruxtecan-nxki (Enhertu) (5.4) q21d     11/19/2022 Tumor Marker   Patient's tumor was tested for the following markers: CA-125. Results of the tumor marker test revealed 8.5.   12/05/2022 Echocardiogram         1. Left ventricular ejection fraction, by estimation, is 60 to 65%. Left ventricular ejection fraction by 3D volume is 64 %. The left ventricle has normal function. The left ventricle has no regional wall motion abnormalities. Left ventricular diastolic  parameters were normal. The average left ventricular global longitudinal strain is -22.6 %. The global longitudinal strain is normal.  2. Right ventricular systolic function is normal. The right ventricular size is normal. Tricuspid regurgitation signal is inadequate for assessing PA pressure.  3. The mitral valve is normal in structure. No evidence of mitral valve regurgitation. No evidence of mitral stenosis.  4. The aortic valve is tricuspid. Aortic valve regurgitation is not visualized. No aortic stenosis is present.  5. The inferior vena cava is normal in size with greater than 50% respiratory variability, suggesting right atrial pressure of 3 mmHg.       PHYSICAL EXAMINATION: ECOG PERFORMANCE STATUS: 1 - Symptomatic but completely ambulatory  Vitals:   12/06/22 1149  BP: (!) 147/86  Pulse: 89  Resp: 18  Temp: 98.7  F (37.1 C)  SpO2: 98%   Filed Weights   12/06/22 1149  Weight: 143 lb 12.8 oz (65.2 kg)    GENERAL:alert, no distress and comfortable LABORATORY DATA:  I have reviewed the data as listed    Component Value Date/Time   NA 138 12/06/2022 1132   NA 140 11/02/2016 1130   K 3.8 12/06/2022 1132   K 3.8 11/02/2016 1130   CL 105 12/06/2022 1132   CL 105 05/02/2012 0822   CO2 26 12/06/2022 1132   CO2 26 11/02/2016 1130   GLUCOSE 92 12/06/2022 1132   GLUCOSE 87 11/02/2016 1130   GLUCOSE 98 05/02/2012 0822   BUN 20 12/06/2022 1132   BUN 14.7 11/02/2016 1130   CREATININE 1.07 (H) 12/06/2022 1132   CREATININE 0.84 12/05/2018 1014   CREATININE 0.8 11/02/2016 1130   CALCIUM 8.9 12/06/2022 1132   CALCIUM 9.7 11/02/2016 1130   PROT 6.7 12/06/2022 1132   PROT 6.8 11/02/2016 1130   ALBUMIN 3.6 12/06/2022 1132   ALBUMIN 3.6 11/02/2016 1130  AST 26 12/06/2022 1132   AST 23 11/02/2016 1130   ALT 17 12/06/2022 1132   ALT 17 11/02/2016 1130   ALKPHOS 94 12/06/2022 1132   ALKPHOS 59 11/02/2016 1130   BILITOT 0.3 12/06/2022 1132   BILITOT 0.34 11/02/2016 1130   GFRNONAA 54 (L) 12/06/2022 1132   GFRAA >60 11/08/2017 0504   GFRAA >60 07/25/2017 1425    No results found for: "SPEP", "UPEP"  Lab Results  Component Value Date   WBC 2.9 (L) 12/06/2022   NEUTROABS 1.2 (L) 12/06/2022   HGB 10.6 (L) 12/06/2022   HCT 32.2 (L) 12/06/2022   MCV 97.0 12/06/2022   PLT 249 12/06/2022      Chemistry      Component Value Date/Time   NA 138 12/06/2022 1132   NA 140 11/02/2016 1130   K 3.8 12/06/2022 1132   K 3.8 11/02/2016 1130   CL 105 12/06/2022 1132   CL 105 05/02/2012 0822   CO2 26 12/06/2022 1132   CO2 26 11/02/2016 1130   BUN 20 12/06/2022 1132   BUN 14.7 11/02/2016 1130   CREATININE 1.07 (H) 12/06/2022 1132   CREATININE 0.84 12/05/2018 1014   CREATININE 0.8 11/02/2016 1130      Component Value Date/Time   CALCIUM 8.9 12/06/2022 1132   CALCIUM 9.7 11/02/2016 1130    ALKPHOS 94 12/06/2022 1132   ALKPHOS 59 11/02/2016 1130   AST 26 12/06/2022 1132   AST 23 11/02/2016 1130   ALT 17 12/06/2022 1132   ALT 17 11/02/2016 1130   BILITOT 0.3 12/06/2022 1132   BILITOT 0.34 11/02/2016 1130       RADIOGRAPHIC STUDIES: I have reviewed CT imaging with the patient I have personally reviewed the radiological images as listed and agreed with the findings in the report. ECHOCARDIOGRAM COMPLETE  Result Date: 12/05/2022    ECHOCARDIOGRAM REPORT   Patient Name:   Kayla Mcgrath Date of Exam: 12/05/2022 Medical Rec #:  [id number]      Height:       59.5 in Accession #:    [id number]     Weight:       142.2 lb Date of Birth:  February 21, 1945      BSA:          1.605 m Patient Age:    76 years       BP:           153/72 mmHg Patient Gender: F              HR:           67 bpm. Exam Location:  Outpatient Procedure: 2D Echo, 3D Echo, Color Doppler, Cardiac Doppler and Strain Analysis Indications:    Z51.11 Encounter for antineoplastic chemotheraphy  History:        Patient has prior history of Echocardiogram examinations, most                 recent 07/27/2022. Signs/Symptoms:Bacteremia; Risk                 Factors:Dyslipidemia. Breast cancer.  Sonographer:    Sheralyn Boatman RDCS Referring Phys: [id number] [place] People IMPRESSIONS  1. Left ventricular ejection fraction, by estimation, is 60 to 65%. Left ventricular ejection fraction by 3D volume is 64 %. The left ventricle has normal function. The left ventricle has no regional wall motion abnormalities. Left ventricular diastolic  parameters were normal. The average left ventricular global longitudinal strain is -22.6 %. The global  longitudinal strain is normal.  2. Right ventricular systolic function is normal. The right ventricular size is normal. Tricuspid regurgitation signal is inadequate for assessing PA pressure.  3. The mitral valve is normal in structure. No evidence of mitral valve regurgitation. No evidence of mitral stenosis.  4. The  aortic valve is tricuspid. Aortic valve regurgitation is not visualized. No aortic stenosis is present.  5. The inferior vena cava is normal in size with greater than 50% respiratory variability, suggesting right atrial pressure of 3 mmHg. FINDINGS  Left Ventricle: Left ventricular ejection fraction, by estimation, is 60 to 65%. Left ventricular ejection fraction by 3D volume is 64 %. The left ventricle has normal function. The left ventricle has no regional wall motion abnormalities. The average left ventricular global longitudinal strain is -22.6 %. The global longitudinal strain is normal. The left ventricular internal cavity size was normal in size. There is no left ventricular hypertrophy. Left ventricular diastolic parameters were normal. Right Ventricle: The right ventricular size is normal. No increase in right ventricular wall thickness. Right ventricular systolic function is normal. Tricuspid regurgitation signal is inadequate for assessing PA pressure. Left Atrium: Left atrial size was normal in size. Right Atrium: Right atrial size was normal in size. Pericardium: There is no evidence of pericardial effusion. Mitral Valve: The mitral valve is normal in structure. No evidence of mitral valve regurgitation. No evidence of mitral valve stenosis. Tricuspid Valve: The tricuspid valve is normal in structure. Tricuspid valve regurgitation is trivial. Aortic Valve: The aortic valve is tricuspid. Aortic valve regurgitation is not visualized. No aortic stenosis is present. Pulmonic Valve: The pulmonic valve was not well visualized. Pulmonic valve regurgitation is not visualized. Aorta: The aortic root and ascending aorta are structurally normal, with no evidence of dilitation. Venous: The inferior vena cava is normal in size with greater than 50% respiratory variability, suggesting right atrial pressure of 3 mmHg. IAS/Shunts: The interatrial septum was not well visualized.  LEFT VENTRICLE PLAX 2D LVIDd:          4.20 cm         Diastology LVIDs:         2.80 cm         LV e' medial:    7.51 cm/s LV PW:         0.90 cm         LV E/e' medial:  12.7 LV IVS:        0.80 cm         LV e' lateral:   9.68 cm/s LVOT diam:     2.30 cm         LV E/e' lateral: 9.8 LV SV:         88 LV SV Index:   55              2D LVOT Area:     4.15 cm        Longitudinal                                Strain                                2D Strain GLS  -22.6 % LV Volumes (MOD)               Avg: LV vol d, MOD  71.9 ml A2C:                           3D Volume EF LV vol d, MOD    76.0 ml       LV 3D EF:    Left A4C:                                        ventricul LV vol s, MOD    25.3 ml                    ar A2C:                                        ejection LV vol s, MOD    29.1 ml                    fraction A4C:                                        by 3D LV SV MOD A2C:   46.6 ml                    volume is LV SV MOD A4C:   76.0 ml                    64 %. LV SV MOD BP:    51.5 ml                                 3D Volume EF:                                3D EF:        64 %                                LV EDV:       124 ml                                LV ESV:       45 ml                                LV SV:        79 ml RIGHT VENTRICLE             IVC RV S prime:     12.20 cm/s  IVC diam: 1.70 cm TAPSE (M-mode): 2.3 cm LEFT ATRIUM             Index        RIGHT ATRIUM           Index LA diam:        2.70 cm 1.68 cm/m   RA Area:     11.10 cm LA Vol (A2C):  54.4 ml 33.90 ml/m  RA Volume:   22.90 ml  14.27 ml/m LA Vol (A4C):   19.3 ml 12.03 ml/m LA Biplane Vol: 35.8 ml 22.31 ml/m  AORTIC VALVE LVOT Vmax:   108.00 cm/s LVOT Vmean:  67.800 cm/s LVOT VTI:    0.212 m  AORTA Ao Root diam: 3.10 cm Ao Asc diam:  3.10 cm MITRAL VALVE MV Area (PHT): 3.88 cm     SHUNTS MV Decel Time: 196 msec     Systemic VTI:  0.21 m MV E velocity: 95.15 cm/s   Systemic Diam: 2.30 cm MV A velocity: 108.00 cm/s MV E/A ratio:  0.88 Epifanio Lesches  MD Electronically signed by Epifanio Lesches MD Signature Date/Time: 12/05/2022/6:06:56 PM    Final

## 2022-12-06 NOTE — Patient Instructions (Signed)
 [facility] - A DEPT OF [facility]  Discharge Instructions: Thank you for choosing [facility] to provide your oncology and hematology care.   If you have a lab appointment with the Cancer Center, please go directly to the Cancer Center and check in at the registration area.   Wear comfortable clothing and clothing appropriate for easy access to any Portacath or PICC line.   We strive to give you quality time with your provider. You may need to reschedule your appointment if you arrive late (15 or more minutes).  Arriving late affects you and other patients whose appointments are after yours.  Also, if you miss three or more appointments without notifying the office, you may be dismissed from the clinic at the provider's discretion.      For prescription refill requests, have your pharmacy contact our office and allow 72 hours for refills to be completed.    Today you received the following chemotherapy and/or immunotherapy agents Enhertu      To help prevent nausea and vomiting after your treatment, we encourage you to take your nausea medication as directed.  BELOW ARE SYMPTOMS THAT SHOULD BE REPORTED IMMEDIATELY: *FEVER GREATER THAN 100.4 F (38 C) OR HIGHER *CHILLS OR SWEATING *NAUSEA AND VOMITING THAT IS NOT CONTROLLED WITH YOUR NAUSEA MEDICATION *UNUSUAL SHORTNESS OF BREATH *UNUSUAL BRUISING OR BLEEDING *URINARY PROBLEMS (pain or burning when urinating, or frequent urination) *BOWEL PROBLEMS (unusual diarrhea, constipation, pain near the anus) TENDERNESS IN MOUTH AND THROAT WITH OR WITHOUT PRESENCE OF ULCERS (sore throat, sores in mouth, or a toothache) UNUSUAL RASH, SWELLING OR PAIN  UNUSUAL VAGINAL DISCHARGE OR ITCHING   Items with * indicate a potential emergency and should be followed up as soon as possible or go to the Emergency Department if any problems should occur.  Please show the CHEMOTHERAPY ALERT CARD or IMMUNOTHERAPY  ALERT CARD at check-in to the Emergency Department and triage nurse.  Should you have questions after your visit or need to cancel or reschedule your appointment, please contact [facility] - A DEPT OF [facility]  Dept: [phone]  and follow the prompts.  Office hours are 8:00 a.m. to 4:30 p.m. Monday - Friday. Please note that voicemails left after 4:00 p.m. may not be returned until the following business day.  We are closed weekends and major holidays. You have access to a nurse at all times for urgent questions. Please call the main number to the clinic Dept: [phone] and follow the prompts.   For any non-urgent questions, you may also contact your provider using MyChart. We now offer e-Visits for anyone 17 and older to request care online for non-urgent symptoms. For details visit [email or web address].   Also download the MyChart app! Go to the app store, search "MyChart", open the app, select [place], and log in with your MyChart username and password.

## 2022-12-06 NOTE — Assessment & Plan Note (Signed)
She has stable pancytopenia due to treatment She is not symptomatic We will proceed with treatment without delay

## 2022-12-06 NOTE — Assessment & Plan Note (Signed)
CT imaging results are not available but based on my review, the patient has positive response to therapy with no signs of disease progression We will continue treatment as scheduled I plan to repeat imaging study again in 3 months, due in February 2025 Her echocardiogram will also be due in February; recent echocardiogram was within normal limits

## 2022-12-08 ENCOUNTER — Other Ambulatory Visit: Payer: Self-pay

## 2022-12-09 ENCOUNTER — Other Ambulatory Visit: Payer: Self-pay

## 2022-12-15 ENCOUNTER — Other Ambulatory Visit: Payer: Self-pay

## 2022-12-26 MED FILL — Fosaprepitant Dimeglumine For IV Infusion 150 MG (Base Eq): INTRAVENOUS | Qty: 5 | Status: AC

## 2022-12-27 ENCOUNTER — Inpatient Hospital Stay: Payer: Medicare HMO

## 2022-12-27 ENCOUNTER — Inpatient Hospital Stay (HOSPITAL_BASED_OUTPATIENT_CLINIC_OR_DEPARTMENT_OTHER): Payer: Medicare HMO | Admitting: Hematology and Oncology

## 2022-12-27 ENCOUNTER — Encounter: Payer: Self-pay | Admitting: Hematology and Oncology

## 2022-12-27 ENCOUNTER — Inpatient Hospital Stay: Payer: Medicare HMO | Attending: Hematology and Oncology

## 2022-12-27 VITALS — BP 136/84 | HR 80 | Temp 98.3°F | Resp 18 | Ht 59.5 in | Wt 139.2 lb

## 2022-12-27 VITALS — BP 136/89 | HR 72 | Temp 98.4°F | Resp 16

## 2022-12-27 DIAGNOSIS — Z5112 Encounter for antineoplastic immunotherapy: Secondary | ICD-10-CM | POA: Diagnosis not present

## 2022-12-27 DIAGNOSIS — R197 Diarrhea, unspecified: Secondary | ICD-10-CM | POA: Insufficient documentation

## 2022-12-27 DIAGNOSIS — C775 Secondary and unspecified malignant neoplasm of intrapelvic lymph nodes: Secondary | ICD-10-CM | POA: Diagnosis not present

## 2022-12-27 DIAGNOSIS — D61818 Other pancytopenia: Secondary | ICD-10-CM | POA: Insufficient documentation

## 2022-12-27 DIAGNOSIS — C549 Malignant neoplasm of corpus uteri, unspecified: Secondary | ICD-10-CM

## 2022-12-27 DIAGNOSIS — Z90710 Acquired absence of both cervix and uterus: Secondary | ICD-10-CM | POA: Diagnosis not present

## 2022-12-27 DIAGNOSIS — Z90722 Acquired absence of ovaries, bilateral: Secondary | ICD-10-CM | POA: Diagnosis not present

## 2022-12-27 DIAGNOSIS — C541 Malignant neoplasm of endometrium: Secondary | ICD-10-CM | POA: Insufficient documentation

## 2022-12-27 LAB — CBC WITH DIFFERENTIAL (CANCER CENTER ONLY)
Abs Immature Granulocytes: 0.01 10*3/uL (ref 0.00–0.07)
Basophils Absolute: 0 10*3/uL (ref 0.0–0.1)
Basophils Relative: 1 %
Eosinophils Absolute: 0.1 10*3/uL (ref 0.0–0.5)
Eosinophils Relative: 4 %
HCT: 31.6 % — ABNORMAL LOW (ref 36.0–46.0)
Hemoglobin: 10.9 g/dL — ABNORMAL LOW (ref 12.0–15.0)
Immature Granulocytes: 0 %
Lymphocytes Relative: 23 %
Lymphs Abs: 0.7 10*3/uL (ref 0.7–4.0)
MCH: 33.1 pg (ref 26.0–34.0)
MCHC: 34.5 g/dL (ref 30.0–36.0)
MCV: 96 fL (ref 80.0–100.0)
Monocytes Absolute: 0.6 10*3/uL (ref 0.1–1.0)
Monocytes Relative: 19 %
Neutro Abs: 1.6 10*3/uL — ABNORMAL LOW (ref 1.7–7.7)
Neutrophils Relative %: 53 %
Platelet Count: 230 10*3/uL (ref 150–400)
RBC: 3.29 MIL/uL — ABNORMAL LOW (ref 3.87–5.11)
RDW: 18 % — ABNORMAL HIGH (ref 11.5–15.5)
WBC Count: 3 10*3/uL — ABNORMAL LOW (ref 4.0–10.5)
nRBC: 0 % (ref 0.0–0.2)

## 2022-12-27 LAB — CMP (CANCER CENTER ONLY)
ALT: 15 U/L (ref 0–44)
AST: 25 U/L (ref 15–41)
Albumin: 3.6 g/dL (ref 3.5–5.0)
Alkaline Phosphatase: 93 U/L (ref 38–126)
Anion gap: 7 (ref 5–15)
BUN: 19 mg/dL (ref 8–23)
CO2: 26 mmol/L (ref 22–32)
Calcium: 9 mg/dL (ref 8.9–10.3)
Chloride: 106 mmol/L (ref 98–111)
Creatinine: 1.04 mg/dL — ABNORMAL HIGH (ref 0.44–1.00)
GFR, Estimated: 56 mL/min — ABNORMAL LOW (ref >60)
Glucose, Bld: 103 mg/dL — ABNORMAL HIGH (ref 70–99)
Potassium: 3.6 mmol/L (ref 3.5–5.1)
Sodium: 139 mmol/L (ref 135–145)
Total Bilirubin: 0.4 mg/dL (ref <1.2)
Total Protein: 6.3 g/dL — ABNORMAL LOW (ref 6.5–8.1)

## 2022-12-27 MED ORDER — SODIUM CHLORIDE 0.9% FLUSH
10.0000 mL | INTRAVENOUS | Status: DC | PRN
  Administered 2022-12-27: 10 mL

## 2022-12-27 MED ORDER — PANTOPRAZOLE SODIUM 40 MG PO TBEC: 40.0000 mg | DELAYED_RELEASE_TABLET | Freq: Every day | ORAL | 1 refills | Status: DC

## 2022-12-27 MED ORDER — DEXAMETHASONE SODIUM PHOSPHATE 10 MG/ML IJ SOLN
10.0000 mg | Freq: Once | INTRAMUSCULAR | Status: AC
Start: 2022-12-27 — End: 2022-12-27
  Administered 2022-12-27: 10 mg via INTRAVENOUS
  Filled 2022-12-27: qty 1

## 2022-12-27 MED ORDER — FAM-TRASTUZUMAB DERUXTECAN-NXKI CHEMO 100 MG IV SOLR
5.4000 mg/kg | Freq: Once | INTRAVENOUS | Status: AC
  Administered 2022-12-27: 360 mg via INTRAVENOUS
  Filled 2022-12-27: qty 18

## 2022-12-27 MED ORDER — HEPARIN SOD (PORK) LOCK FLUSH 100 UNIT/ML IV SOLN
500.0000 [IU] | Freq: Once | INTRAVENOUS | Status: AC | PRN
  Administered 2022-12-27: 500 [IU]

## 2022-12-27 MED ORDER — DEXTROSE 5 % IV SOLN: Freq: Once | INTRAVENOUS | Status: AC

## 2022-12-27 MED ORDER — SODIUM CHLORIDE 0.9 % IV SOLN
150.0000 mg | Freq: Once | INTRAVENOUS | Status: AC
  Administered 2022-12-27: 150 mg via INTRAVENOUS
  Filled 2022-12-27: qty 150

## 2022-12-27 MED ORDER — PALONOSETRON HCL INJECTION 0.25 MG/5ML
0.2500 mg | Freq: Once | INTRAVENOUS | Status: AC
  Administered 2022-12-27: 0.25 mg via INTRAVENOUS
  Filled 2022-12-27: qty 5

## 2022-12-27 MED ORDER — SODIUM CHLORIDE 0.9% FLUSH
10.0000 mL | Freq: Once | INTRAVENOUS | Status: AC
  Administered 2022-12-27: 10 mL

## 2022-12-27 NOTE — Patient Instructions (Signed)
 [facility]  Discharge Instructions: Thank you for choosing [facility] to provide your oncology and hematology care.   If you have a lab appointment with the Cancer Center, please go directly to the Cancer Center and check in at the registration area.   Wear comfortable clothing and clothing appropriate for easy access to any Portacath or PICC line.   We strive to give you quality time with your provider. You may need to reschedule your appointment if you arrive late (15 or more minutes).  Arriving late affects you and other patients whose appointments are after yours.  Also, if you miss three or more appointments without notifying the office, you may be dismissed from the clinic at the provider's discretion.      For prescription refill requests, have your pharmacy contact our office and allow 72 hours for refills to be completed.    Today you received the following chemotherapy and/or immunotherapy agents: Enhertu      To help prevent nausea and vomiting after your treatment, we encourage you to take your nausea medication as directed.  BELOW ARE SYMPTOMS THAT SHOULD BE REPORTED IMMEDIATELY: *FEVER GREATER THAN 100.4 F (38 C) OR HIGHER *CHILLS OR SWEATING *NAUSEA AND VOMITING THAT IS NOT CONTROLLED WITH YOUR NAUSEA MEDICATION *UNUSUAL SHORTNESS OF BREATH *UNUSUAL BRUISING OR BLEEDING *URINARY PROBLEMS (pain or burning when urinating, or frequent urination) *BOWEL PROBLEMS (unusual diarrhea, constipation, pain near the anus) TENDERNESS IN MOUTH AND THROAT WITH OR WITHOUT PRESENCE OF ULCERS (sore throat, sores in mouth, or a toothache) UNUSUAL RASH, SWELLING OR PAIN  UNUSUAL VAGINAL DISCHARGE OR ITCHING   Items with * indicate a potential emergency and should be followed up as soon as possible or go to the Emergency Department if any problems should occur.  Please show the CHEMOTHERAPY ALERT CARD or IMMUNOTHERAPY  ALERT CARD at check-in to the Emergency Department and triage nurse.  Should you have questions after your visit or need to cancel or reschedule your appointment, please contact [facility]  Dept: [phone]  and follow the prompts.  Office hours are 8:00 a.m. to 4:30 p.m. Monday - Friday. Please note that voicemails left after 4:00 p.m. may not be returned until the following business day.  We are closed weekends and major holidays. You have access to a nurse at all times for urgent questions. Please call the main number to the clinic Dept: [phone] and follow the prompts.   For any non-urgent questions, you may also contact your provider using MyChart. We now offer e-Visits for anyone 70 and older to request care online for non-urgent symptoms. For details visit [email or web address].   Also download the MyChart app! Go to the app store, search "MyChart", open the app, select [place], and log in with your MyChart username and password.

## 2022-12-27 NOTE — Assessment & Plan Note (Signed)
CT imaging show positive response to therapy We will continue treatment as scheduled I plan to repeat imaging study again in 3 months, due in February 2025 Her echocardiogram will also be due in February; recent echocardiogram was within normal limits

## 2022-12-27 NOTE — Assessment & Plan Note (Signed)
 She has stable pancytopenia due to treatment She is not symptomatic We will proceed with treatment without delay

## 2022-12-27 NOTE — Assessment & Plan Note (Signed)
She has mild intermittent diarrhea It does not seem to correlate with timing of her treatment Continue supportive care with Imodium as needed

## 2022-12-27 NOTE — Progress Notes (Signed)
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[facility]) CT imaging show positive response to therapy We will continue treatment as scheduled I plan to repeat imaging study again in 3 months, due in February 2025 Her echocardiogram will also be due in February; recent echocardiogram was within normal limits  Pancytopenia, acquired [facility]) She has stable pancytopenia due to treatment She is not symptomatic We will proceed with treatment without Mcgrath  Diarrhea She has mild intermittent diarrhea It does not seem to correlate with timing of her treatment Continue supportive care with Imodium as needed  No orders of the defined types were placed in this encounter.   All questions were answered. The patient knows to call the clinic with any problems, questions or concerns. The total time spent in the appointment was 20 minutes encounter with patients including review of chart and various tests results, discussions about plan of care and coordination of care plan   Kayla Delay, MD 12/27/2022 12:54 PM  INTERVAL HISTORY: Please see below for problem oriented charting. she returns for chemotherapy and follow-up She is doing well except for mild intermittent diarrhea No other side effects from treatment  REVIEW OF SYSTEMS:   Constitutional: Denies fevers, chills or abnormal weight loss Eyes: Denies blurriness of vision Ears, nose, mouth, throat, and face: Denies mucositis or sore throat Respiratory: Denies cough, dyspnea or wheezes Cardiovascular: Denies palpitation, chest discomfort or lower extremity swelling Skin: Denies abnormal skin rashes Lymphatics: Denies new lymphadenopathy or easy bruising Neurological:Denies numbness, tingling or new weaknesses Behavioral/Psych: Mood is stable,  no new changes  All other systems were reviewed with the patient and are negative.  I have reviewed the past medical history, past surgical history, social history and family history with the patient and they are unchanged from previous note.  ALLERGIES:  is allergic to tylenol [acetaminophen] and latex.  MEDICATIONS:  Current Outpatient Medications  Medication Sig Dispense Refill   Cyanocobalamin (VITAMIN B 12 PO) Take 1 tablet by mouth.     escitalopram (LEXAPRO) 20 MG tablet Take 1 tablet (20 mg total) by mouth daily. 90 tablet 0   Magnesium 200 MG TABS Take 2 tablets by mouth daily.     ondansetron (ZOFRAN) 8 MG tablet Take 1 tablet (8 mg total) by mouth every 8 (eight) hours as needed for nausea or vomiting. Start on the third day after chemotherapy. 30 tablet 1   pantoprazole (PROTONIX) 40 MG tablet Take 1 tablet (40 mg total) by mouth daily. 90 tablet 1   prochlorperazine (COMPAZINE) 10 MG tablet Take 1 tablet (10 mg total) by mouth every 6 (six) hours as needed for nausea or vomiting. 30 tablet 1   traMADol (ULTRAM) 50 MG tablet Take 1 tablet (50 mg total) by mouth every 6 (six) hours as needed. 60 tablet 0   VITAMIN D, CHOLECALCIFEROL, PO Take 1 tablet by mouth daily.     No current facility-administered medications for this visit.   Facility-Administered Medications Ordered in Other Visits  Medication Dose Route Frequency Provider Last Rate Last Admin   fam-trastuzumab deruxtecan-nxki (ENHERTU) 360 mg in dextrose 5 % 100 mL chemo infusion  5.4 mg/kg (Treatment Plan Recorded) Intravenous Once Bertis Ruddy, Mayela Bullard, MD       heparin lock flush 100 unit/mL  500 Units Intracatheter Once PRN Kayla Delay, MD  sodium chloride flush (NS) 0.9 % injection 10 mL  10 mL Intracatheter PRN Bertis Ruddy, Ardon Franklin, MD        SUMMARY OF ONCOLOGIC HISTORY: Oncology History Overview Note  MMR normal MSI stable PD-L1 CPS: 1% Her2 positive P53 + Carcinosarcoma Neg genetics ER negative   Carcinosarcoma of  body of uterus (HCC)  09/23/2021 Initial Diagnosis   She presented with PMB   09/28/2021 Imaging   US pelvis Enlarged uterus with fibroids. Pelvic sonogram is otherwise unremarkable.    11/11/2021 Pathology Results   A. CERVIX, MASS, EXCISION:  - HIGH-GRADE UNDIFFERENTIATED MALIGNANCY WITH EXTENSIVE NECROSIS (SEE COMMENT).   COMMENT:  The tumor consists of mostly high-grade sarcomatoid morphology with focal epithelioid morphology.  Immunohistochemical stains are performed on block A4.  The tumor cells are positive for desmin and p16 while negative for CD45, CK7, CK20, chromogranin, synaptophysin, Melan-A, p40, AE1/AE3, SMA, p63, cyclin D1, S100 and smooth muscle myosin.  Ki-67 proliferation index is high.  CD10 staining shows focal nonspecific staining.  The differential diagnosis includes a high-grade sarcoma and a partially sampled carcinosarcoma.    11/25/2021 Imaging   MRI pelvis Marked distention of the entire endometrial cavity by heterogeneously enhancing soft tissue density, which extends through the endocervical canal into the lower vagina. This is highly suspicious for endometrial carcinoma.   Diffuse myometrial thinning due to marked distention of endometrial cavity limits evaluation; deep myometrial invasion cannot be excluded in the uterine fundus.   No evidence of extra-uterine extension of tumor.   Lymphadenopathy in lower abdominal retroperitoneum, bilateral iliac chains, and sigmoid mesocolon, consistent with metastatic disease.   Sigmoid diverticulosis. No radiographic evidence of diverticulitis.   11/26/2021 Initial Diagnosis   Uterine cancer (HCC)   11/26/2021 Cancer Staging   Staging form: Corpus Uteri - Carcinoma and Carcinosarcoma, AJCC 8th Edition - Clinical stage from 11/26/2021: FIGO Stage IIIC2 (cT3, cN2, cM0) - Signed by Kayla Delay, MD on 11/26/2021 Stage prefix: Initial diagnosis Histologic grade (G): G3 Histologic grading system: 3 grade system    11/30/2021 Pathology Results   FINAL MICROSCOPIC DIAGNOSIS:  A. UTERINE CONTENTS, BIOPSY: - HIGH-GRADE UNDIFFERENTIATED MALIGNANCY WITH EXTENSIVE NECROSIS (SEE COMMENT).  COMMENT: The patient's history of a high-grade undifferentiated malignancy with extensive necrosis of the cervix is noted.  The morphological features of the current tumor cells are similar to the tumor cells seen in the previous cervical mass excision.  Immunohistochemical staining for desmin and cytokeratin AE1/AE3 is performed on block A1.  The tumor cells are diffusely positive for desmin.   AE1/AE3 stain is focally positive, however this is scant and of unclear clinical significance. Given the small quantity of tissue in the current biopsy, a definitive distinction between a high-grade sarcoma and carcinosarcoma cannot be made as this biopsy may not be representative of the entire tumor. If clinically indicated, this case as well as the previous cervical mass excision ([id number]) may be sent out for expert consultation. Clinical correlation recommended.     12/01/2021 Tumor Marker   Patient's tumor was tested for the following markers: CA-125. Results of the tumor marker test revealed 491.   12/22/2021 Surgery   Robotic-assisted laparoscopic total hysterectomy with bilateral salpingo-oophorectomy, tumor debulking including high common iliac lymph node excision, aborted attempt of bilateral external iliac enlarged lymph nodes, mini-lap for specimen delivery, cystoscopy    12/22/2021 Pathology Results   FINAL MICROSCOPIC DIAGNOSIS:  A. LEFT CERVICAL MARGIN, EXCISION: - Positive for carcinoma  B. HIGH LEFT COMMON ILIAC LYMPH NODE, EXCISION: - Metastatic  carcinosarcoma involving the lymph node  C. 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, including undifferentiated sarcoma with rhabdoid features and high-grade serous carcinoma, see  comment - Tumor invades for a depth of 1 mm where myometrial thickness is 5 mm - Benign bilateral fallopian tubes and ovaries - See oncology table - See comment  ONCOLOGY TABLE:  UTERUS, CARCINOMA OR CARCINOSARCOMA: Resection  Procedure: Total hysterectomy and bilateral salpingo-oophorectomy Histologic Type: Carcinosarcoma (mixed malignant Mullerian tumor) Histologic Grade: High-grade Myometrial Invasion:      Depth of Myometrial Invasion (mm): 1 mm      Myometrial Thickness (mm): 5 mm      Percentage of Myometrial Invasion: 20% Uterine Serosa Involvement: Not identified Cervical stromal Involvement: Present Extent of involvement of other tissue/organs: Not identified Peritoneal/Ascitic Fluid: Not applicable Lymphovascular Invasion: Present Regional Lymph Nodes:      Pelvic Lymph Nodes Examined:                                  0 Sentinel                                  1 Non-sentinel                                  1 Total      Pelvic Lymph Nodes with Metastasis: 1                          Macrometastasis: (>2.0 mm): 1                          Micrometastasis: (>0.2 mm and < 2.0 mm): 0                          Isolated Tumor Cells (<0.2 mm): 0                          Laterality of Lymph Node with Tumor: Left                          Extracapsular Extension: Not identified      Para-aortic Lymph Nodes Examined:                                   0 Sentinel                                   0 non-sentinel                                   0 total Distant Metastasis:      Distant Site(s) Involved: Not applicable Pathologic Stage Classification (pTNM, AJCC 8th Edition): pT1a, pN1 Ancillary Studies: MMR / MSI testing will be ordered Representative Tumor Block: C1 Comment(s): None  ([id number])       01/13/2022 Procedure   Placement of single lumen  port a cath via right internal jugular vein. The catheter tip lies at the cavo-atrial junction. A power injectable port a cath  was placed and is ready for immediate use.     01/19/2022 Echocardiogram    1. Left ventricular ejection fraction, by estimation, is 60 to 65%. The left ventricle has normal function. The left ventricle has no regional wall motion abnormalities. Left ventricular diastolic parameters are consistent with Grade I diastolic dysfunction (impaired relaxation). The average left ventricular global longitudinal strain is -24.0 %. The global longitudinal strain is normal.  2. Right ventricular systolic function is normal. The right ventricular size is normal. There is normal pulmonary artery systolic pressure.  3. Left atrial size was mildly dilated.  4. The mitral valve is normal in structure. No evidence of mitral valve regurgitation. No evidence of mitral stenosis.  5. The aortic valve was not well visualized. Aortic valve regurgitation is not visualized. Aortic valve sclerosis is present, with no evidence of aortic valve stenosis.  6. The inferior vena cava is normal in size with greater than 50% respiratory variability, suggesting right atrial pressure of 3 mmHg.     01/27/2022 Tumor Marker   Patient's tumor was tested for the following markers: CA-125. Results of the tumor marker test revealed 307.   02/14/2022 - 08/22/2022 Chemotherapy   Patient is on Treatment Plan : UTERINE SEROUS CARCINOMA Carboplatin + Paclitaxel + Trastuzumab q21d x 6 Cycles / Trastuzumab q21d     02/15/2022 Tumor Marker   Patient's tumor was tested for the following markers: CA-125. Results of the tumor marker test revealed 161.   03/29/2022 Tumor Marker   Patient's tumor was tested for the following markers: CA-125. Results of the tumor marker test revealed 30.   04/13/2022 Echocardiogram   1. Left ventricular ejection fraction, by estimation, is 60 to 65%. The left ventricle has normal function. The left ventricle has no regional wall motion abnormalities. Left ventricular diastolic parameters are consistent with Grade I  diastolic dysfunction (impaired relaxation). The average left ventricular global longitudinal strain is -19.4 %. The global longitudinal strain is normal.  2. Right ventricular systolic function is normal. The right ventricular size is normal. There is normal pulmonary artery systolic pressure. The estimated right ventricular systolic pressure is 21.0 mmHg.  3. Left atrial size was mild to moderately dilated.  4. The mitral valve is grossly normal. Trivial mitral valve regurgitation.  5. The aortic valve is tricuspid. Aortic valve regurgitation is not visualized. Aortic valve sclerosis is present, with no evidence of aortic valve stenosis.  6. The inferior vena cava is normal in size with greater than 50% respiratory variability, suggesting right atrial pressure of 3 mmHg.   06/01/2022 Tumor Marker   Patient's tumor was tested for the following markers: CA-125. Results of the tumor marker test revealed 8.   06/05/2022 Genetic Testing   Negative genetic testing on the Multi-Cancer gene panel + RNA.  The report date is Jun 05, 2022.  The Multi-Cancer + RNA Panel offered by [facility] includes sequencing and/or deletion/duplication analysis of the following 70 genes:  AIP*, ALK, APC*, ATM*, AXIN2*, BAP1*, BARD1*, BLM*, BMPR1A*, BRCA1*, BRCA2*, BRIP1*, CDC73*, CDH1*, CDK4, CDKN1B*, CDKN2A, CHEK2*, CTNNA1*, DICER1*, EPCAM (del/dup only), EGFR, FH*, FLCN*, GREM1 (promoter dup only), HOXB13, KIT, LZTR1, MAX*, MBD4, MEN1*, MET, MITF, MLH1*, MSH2*, MSH3*, MSH6*, MUTYH*, NF1*, NF2*, NTHL1*, PALB2*, PDGFRA, PMS2*, POLD1*, POLE*, POT1*, PRKAR1A*, PTCH1*, PTEN*, RAD51C*, RAD51D*, RB1*, RET, SDHA* (sequencing only), SDHAF2*, SDHB*, SDHC*, SDHD*, SMAD4*, SMARCA4*, SMARCB1*, SMARCE1*,  STK11*, SUFU*, GMWN027*, TP53*, TSC1*, TSC2*, VHL*. RNA analysis is performed for * genes.    06/20/2022 Imaging   1. Enlarged periaortic and iliac lymph nodes consistent metastatic disease. Bulky iliac node on the LEFT. 2. No evidence of  peritoneal metastasis. 3. Post hysterectomy and oophorectomy.   07/13/2022 Tumor Marker   Patient's tumor was tested for the following markers: CA-125. Results of the tumor marker test revealed 8.8.   07/27/2022 Echocardiogram   ECHOCARDIOGRAM COMPLETE  Result Date: 07/27/2022    ECHOCARDIOGRAM REPORT   Patient Name:   Kayla Mcgrath Budnick Date of Exam: 07/27/2022 Medical Rec #:  [id number]      Height:       59.5 in Accession #:    [id number]     Weight:       140.4 lb Date of Birth:  Jun 15, 1945      BSA:          1.596 m Patient Age:    76 years       BP:           135/92 mmHg Patient Gender: F              HR:           69 bpm. Exam Location:  Outpatient Procedure: 2D Echo, Color Doppler, Cardiac Doppler and Strain Analysis Indications:    Chemo  History:        Patient has prior history of Echocardiogram examinations, most                 recent 04/13/2022. Breast Cancer, Uterine Cancer, CKD; Risk                 Factors:Dyslipidemia.  Sonographer:    Milbert Coulter Referring Phys: [id number] Domino Holten  Sonographer Comments: Global longitudinal strain was attempted. IMPRESSIONS  1. Prior GLS was -19.4 on 04/13/22 suggest f/u echo in 3 months to make sure not seeing early myocardial issues . Left ventricular ejection fraction, by estimation, is 55 to 60%. The left ventricle has normal function. The left ventricle has no regional wall motion abnormalities. Left ventricular diastolic parameters were normal. The average left ventricular global longitudinal strain is -15.9 %. The global longitudinal strain is normal.  2. Right ventricular systolic function is normal. The right ventricular size is normal.  3. The mitral valve is normal in structure. No evidence of mitral valve regurgitation. No evidence of mitral stenosis.  4. The aortic valve was not well visualized. There is mild calcification of the aortic valve. Aortic valve regurgitation is not visualized. Aortic valve sclerosis is present, with no evidence of aortic  valve stenosis.  5. The inferior vena cava is normal in size with greater than 50% respiratory variability, suggesting right atrial pressure of 3 mmHg. FINDINGS  Left Ventricle: Prior GLS was -19.4 on 04/13/22 suggest f/u echo in 3 months to make sure not seeing early myocardial issues. Left ventricular ejection fraction, by estimation, is 55 to 60%. The left ventricle has normal function. The left ventricle has no regional wall motion abnormalities. The average left ventricular global longitudinal strain is -15.9 %. The global longitudinal strain is normal. The left ventricular internal cavity size was normal in size. There is no left ventricular hypertrophy. Left ventricular diastolic parameters were normal. Right Ventricle: The right ventricular size is normal. No increase in right ventricular wall thickness. Right ventricular systolic function is normal. Left Atrium: Left atrial size was normal in size. Right Atrium: Right atrial  size was normal in size. Pericardium: There is no evidence of pericardial effusion. Mitral Valve: The mitral valve is normal in structure. No evidence of mitral valve regurgitation. No evidence of mitral valve stenosis. Tricuspid Valve: The tricuspid valve is normal in structure. Tricuspid valve regurgitation is not demonstrated. No evidence of tricuspid stenosis. Aortic Valve: The aortic valve was not well visualized. There is mild calcification of the aortic valve. Aortic valve regurgitation is not visualized. Aortic valve sclerosis is present, with no evidence of aortic valve stenosis. Aortic valve mean gradient measures 4.0 mmHg. Aortic valve peak gradient measures 8.2 mmHg. Aortic valve area, by VTI measures 1.40 cm. Pulmonic Valve: The pulmonic valve was normal in structure. Pulmonic valve regurgitation is not visualized. No evidence of pulmonic stenosis. Aorta: The aortic root is normal in size and structure. Venous: The inferior vena cava is normal in size with greater than 50%  respiratory variability, suggesting right atrial pressure of 3 mmHg. IAS/Shunts: No atrial level shunt detected by color flow Doppler.  LEFT VENTRICLE PLAX 2D LVIDd:         4.00 cm   Diastology LVIDs:         2.60 cm   LV e' medial:    5.77 cm/s LV PW:         0.90 cm   LV E/e' medial:  10.8 LV IVS:        0.80 cm   LV e' lateral:   13.50 cm/s LVOT diam:     1.70 cm   LV E/e' lateral: 4.6 LV SV:         41 LV SV Index:   26        2D Longitudinal Strain LVOT Area:     2.27 cm  2D Strain GLS Avg:     -15.9 %  RIGHT VENTRICLE RV Basal diam:  2.70 cm RV Mid diam:    2.10 cm RV S prime:     12.80 cm/s TAPSE (M-mode): 2.1 cm LEFT ATRIUM             Index        RIGHT ATRIUM          Index LA diam:        2.70 cm 1.69 cm/m   RA Area:     8.87 cm LA Vol (A2C):   35.4 ml 22.18 ml/m  RA Volume:   16.30 ml 10.21 ml/m LA Vol (A4C):   36.6 ml 22.93 ml/m LA Biplane Vol: 36.1 ml 22.62 ml/m  AORTIC VALVE AV Area (Vmax):    1.38 cm AV Area (Vmean):   1.35 cm AV Area (VTI):     1.40 cm AV Vmax:           143.00 cm/s AV Vmean:          93.400 cm/s AV VTI:            0.293 m AV Peak Grad:      8.2 mmHg AV Mean Grad:      4.0 mmHg LVOT Vmax:         87.00 cm/s LVOT Vmean:        55.700 cm/s LVOT VTI:          0.180 m LVOT/AV VTI ratio: 0.62  AORTA Ao Root diam: 2.80 cm Ao Asc diam:  3.00 cm MITRAL VALVE               TRICUSPID VALVE MV Area (PHT): 3.30 cm  TR Peak grad:   21.7 mmHg MV Decel Time: 230 msec    TR Vmax:        233.00 cm/s MV E velocity: 62.10 cm/s MV A velocity: 87.80 cm/s  SHUNTS MV E/A ratio:  0.71        Systemic VTI:  0.18 m                            Systemic Diam: 1.70 cm Charlton Haws MD Electronically signed by Charlton Haws MD Signature Date/Time: 07/27/2022/10:27:37 AM    Final       08/23/2022 Tumor Marker   Patient's tumor was tested for the following markers: CA-125. Results of the tumor marker test revealed 7.2.   09/11/2022 Imaging   CT CHEST ABDOMEN PELVIS W CONTRAST  Result Date:  09/12/2022 CLINICAL DATA:  evaluate treatment response of uterine cancer/carcinosarcoma of uterus/endometrium. * Tracking Code: BO * EXAM: CT CHEST, ABDOMEN, AND PELVIS WITH CONTRAST TECHNIQUE: Multidetector CT imaging of the chest, abdomen and pelvis was performed following the standard protocol during bolus administration of intravenous contrast. RADIATION DOSE REDUCTION: This exam was performed according to the departmental dose-optimization program which includes automated exposure control, adjustment of the mA and/or kV according to patient size and/or use of iterative reconstruction technique. CONTRAST:  80mL OMNIPAQUE IOHEXOL 300 MG/ML  SOLN COMPARISON:  06/13/2022 abdominopelvic CT. Most recent chest CT 12/01/2021 FINDINGS: CT CHEST FINDINGS Cardiovascular: right Port-A-Cath terminates at the high right atrium. Aortic atherosclerosis. Normal heart size, without pericardial effusion. Lad coronary artery calcification. No central pulmonary embolism, on this non-dedicated study. Mediastinum/Nodes: 1.4 cm right thyroid nodule. Not clinically significant; no follow-up imaging recommended (ref: J Am Coll Radiol. 2015 Feb;12(2): 143-50). No supraclavicular adenopathy. No axillary adenopathy. Right axillary node dissection. No mediastinal or hilar adenopathy. Esophageal fluid level on 23/2. Lungs/Pleura: No pleural fluid. Posterior central left upper lobe 7 x 6 mm nodule on 42/6 is similar to on image 46 of the prior. Musculoskeletal: Right breast implant. No acute osseous abnormality. CT ABDOMEN PELVIS FINDINGS Hepatobiliary: There may be a tiny cyst in segment 4A anteriorly. Normal gallbladder, without biliary ductal dilatation. Pancreas: Normal, without mass or ductal dilatation. Spleen: Normal in size, without focal abnormality. Adrenals/Urinary Tract: Normal adrenal glands. Normal kidneys, without hydronephrosis. Normal urinary bladder. Stomach/Bowel: Apparent proximal gastric wall thickening including on 43/2  is most likely due to underdistention. Normal distal stomach. Extensive colonic diverticulosis. Normal terminal ileum. Normal small bowel. Vascular/Lymphatic: Aortic atherosclerosis. Index left periaortic node measures 1.2 x 1.7 cm on 71/2 versus 1.3 x 1.7 cm on the prior. A preaortic/pre caval 12 mm node on 72/2 is enlarged from 7 mm on the prior exam (when remeasured). More cephalad left periaortic node measures 1.0 cm on 60/2 versus 1.1 cm on the prior. Index right common iliac node measures 9 mm on 82/2 versus 13 mm on the prior. Right external iliac centrally necrotic node measures 1.4 cm on 95/2 versus 1.2 cm on the prior. Dominant left external iliac nodal mass measures 3.1 x 3.4 cm on 92/2 versus 2.3 x 2.8 cm on the prior exam Reproductive: Hysterectomy.  No adnexal mass. Other: No significant free fluid. No evidence of omental or peritoneal disease. Musculoskeletal: Osteopenia. S shaped thoracolumbar spine curvature. IMPRESSION: CT CHEST IMPRESSION 1. 7 x 6 mm left upper lobe pulmonary nodule is similar compared to 12/01/2021. Stability favors a benign etiology. Recommend attention on follow-up to exclude unlikely indolent primary neoplasm  or isolated metastasis. 2. No thoracic adenopathy. 3. Coronary artery atherosclerosis. Aortic Atherosclerosis (ICD10-I70.0). 4. Esophageal air fluid level suggests dysmotility or gastroesophageal reflux. CT ABDOMEN AND PELVIS IMPRESSION 1. Mixed response, with overall progression of nodal metastasis. Although some nodes are smaller, the dominant left pelvic sidewall nodal mass has significantly enlarged. 2. No new sites of metastatic disease identified. Electronically Signed   By: Jeronimo Greaves M.D.   On: 09/12/2022 11:07   MM 3D SCREENING MAMMOGRAM UNILATERAL LEFT BREAST  Result Date: 09/05/2022 CLINICAL DATA:  Screening. EXAM: DIGITAL SCREENING UNILATERAL LEFT MAMMOGRAM WITH CAD AND TOMOSYNTHESIS TECHNIQUE: Left screening digital craniocaudal and mediolateral oblique  mammograms were obtained. Left screening digital breast tomosynthesis was performed. The images were evaluated with computer-aided detection. COMPARISON:  Previous exam(s). ACR Breast Density Category c: The breasts are heterogeneously dense, which may obscure small masses. FINDINGS: The patient has had a right mastectomy. There are no findings suspicious for malignancy. IMPRESSION: No mammographic evidence of malignancy. A result letter of this screening mammogram will be mailed directly to the patient. RECOMMENDATION: Screening mammogram in one year.  (Code:SM-L-19M) BI-RADS CATEGORY  1: Negative. Electronically Signed   By: Ted Mcalpine M.D.   On: 09/05/2022 11:09      10/04/2022 - 10/04/2022 Chemotherapy   Patient is on Treatment Plan : Uterine Carboplatin + Paclitaxel + Bevacizumab q21d     10/04/2022 -  Chemotherapy   Patient is on Treatment Plan : BREAST METASTATIC Fam-Trastuzumab Deruxtecan-nxki (Enhertu) (5.4) q21d     11/19/2022 Tumor Marker   Patient's tumor was tested for the following markers: CA-125. Results of the tumor marker test revealed 8.5.   11/29/2022 Imaging   CT ABDOMEN PELVIS W CONTRAST  Result Date: 12/07/2022 CLINICAL DATA:  Uterine sarcoma, assess treatment response. * Tracking Code: BO * EXAM: CT ABDOMEN AND PELVIS WITH CONTRAST TECHNIQUE: Multidetector CT imaging of the abdomen and pelvis was performed using the standard protocol following bolus administration of intravenous contrast. RADIATION DOSE REDUCTION: This exam was performed according to the departmental dose-optimization program which includes automated exposure control, adjustment of the mA and/or kV according to patient size and/or use of iterative reconstruction technique. CONTRAST:  OMNIPAQUE IOHEXOL 300 MG/ML  SOLN COMPARISON:  09/08/2022. FINDINGS: Lower chest: Mild scattered pulmonary parenchymal scarring. No acute findings. Heart is enlarged. No pericardial or pleural effusion. Distal  esophagus is grossly unremarkable. Hepatobiliary: Tiny low-attenuation lesion in the left hepatic lobe, too small to characterize. Liver and gallbladder are otherwise unremarkable. No biliary ductal dilatation. Pancreas: Negative. Spleen: Tiny low-attenuation lesions in the spleen, likely cysts. Adrenals/Urinary Tract: Adrenal glands are unremarkable. Millimetric low-attenuation lesions in the kidneys, too small to characterize. No specific follow-up necessary. Kidneys are otherwise unremarkable. Ureters are decompressed. Bladder is grossly unremarkable. Stomach/Bowel: Stomach, small bowel, appendix and colon are unremarkable. Vascular/Lymphatic: Atherosclerotic calcification of aorta. Generally similar abdominal and pelvic retroperitoneal lymph nodes with index heterogeneous 11 mm left periaortic lymph node (2/33), stable. Aortocaval lymph node has decreased minimally in size in the interval, now measuring 10 mm (2/35), previously 12 mm. Right external iliac lymph node has also decreased minimally in size, now 9 mm (2/58), previously 14 mm. 10 mm lymph node in the sigmoid mesocolon (2/65), stable. Reproductive: Hysterectomy. Left adnexal heterogeneous cystic and solid mass is stable in size, 3.0 x 3.4 cm (2/54). Other: No free fluid.  Small bilateral inguinal hernias contain fat. Musculoskeletal: Degenerative changes in the spine. No worrisome lytic or sclerotic lesions. Dextroconvex scoliosis. IMPRESSION: 1. Slight  response to therapy as evidenced by decrease in size of aortocaval and right external iliac lymph nodes. Left adnexal metastasis, adjacent lymph node in the sigmoid mesocolon and other abdominopelvic retroperitoneal lymph nodes are stable. 2.  Aortic atherosclerosis (ICD10-I70.0). Electronically Signed   By: Leanna Battles M.D.   On: 12/07/2022 09:37   ECHOCARDIOGRAM COMPLETE  Result Date: 12/05/2022    ECHOCARDIOGRAM REPORT   Patient Name:   Kayla Mcgrath [facility] Date of Exam: 12/05/2022 Medical Rec #:   [id number]      Height:       59.5 in Accession #:    [id number]     Weight:       142.2 lb Date of Birth:  1945/09/22      BSA:          1.605 m Patient Age:    76 years       BP:           153/72 mmHg Patient Gender: F              HR:           67 bpm. Exam Location:  Outpatient Procedure: 2D Echo, 3D Echo, Color Doppler, Cardiac Doppler and Strain Analysis Indications:    Z51.11 Encounter for antineoplastic chemotheraphy  History:        Patient has prior history of Echocardiogram examinations, most                 recent 07/27/2022. Signs/Symptoms:Bacteremia; Risk                 Factors:Dyslipidemia. Breast cancer.  Sonographer:    Sheralyn Boatman RDCS Referring Phys: [id number] Jaelynn Pozo IMPRESSIONS  1. Left ventricular ejection fraction, by estimation, is 60 to 65%. Left ventricular ejection fraction by 3D volume is 64 %. The left ventricle has normal function. The left ventricle has no regional wall motion abnormalities. Left ventricular diastolic  parameters were normal. The average left ventricular global longitudinal strain is -22.6 %. The global longitudinal strain is normal.  2. Right ventricular systolic function is normal. The right ventricular size is normal. Tricuspid regurgitation signal is inadequate for assessing PA pressure.  3. The mitral valve is normal in structure. No evidence of mitral valve regurgitation. No evidence of mitral stenosis.  4. The aortic valve is tricuspid. Aortic valve regurgitation is not visualized. No aortic stenosis is present.  5. The inferior vena cava is normal in size with greater than 50% respiratory variability, suggesting right atrial pressure of 3 mmHg. FINDINGS  Left Ventricle: Left ventricular ejection fraction, by estimation, is 60 to 65%. Left ventricular ejection fraction by 3D volume is 64 %. The left ventricle has normal function. The left ventricle has no regional wall motion abnormalities. The average left ventricular global longitudinal strain is -22.6 %. The  global longitudinal strain is normal. The left ventricular internal cavity size was normal in size. There is no left ventricular hypertrophy. Left ventricular diastolic parameters were normal. Right Ventricle: The right ventricular size is normal. No increase in right ventricular wall thickness. Right ventricular systolic function is normal. Tricuspid regurgitation signal is inadequate for assessing PA pressure. Left Atrium: Left atrial size was normal in size. Right Atrium: Right atrial size was normal in size. Pericardium: There is no evidence of pericardial effusion. Mitral Valve: The mitral valve is normal in structure. No evidence of mitral valve regurgitation. No evidence of mitral valve stenosis. Tricuspid Valve: The tricuspid valve is normal in structure. Tricuspid  valve regurgitation is trivial. Aortic Valve: The aortic valve is tricuspid. Aortic valve regurgitation is not visualized. No aortic stenosis is present. Pulmonic Valve: The pulmonic valve was not well visualized. Pulmonic valve regurgitation is not visualized. Aorta: The aortic root and ascending aorta are structurally normal, with no evidence of dilitation. Venous: The inferior vena cava is normal in size with greater than 50% respiratory variability, suggesting right atrial pressure of 3 mmHg. IAS/Shunts: The interatrial septum was not well visualized.  LEFT VENTRICLE PLAX 2D LVIDd:         4.20 cm         Diastology LVIDs:         2.80 cm         LV e' medial:    7.51 cm/s LV PW:         0.90 cm         LV E/e' medial:  12.7 LV IVS:        0.80 cm         LV e' lateral:   9.68 cm/s LVOT diam:     2.30 cm         LV E/e' lateral: 9.8 LV SV:         88 LV SV Index:   55              2D LVOT Area:     4.15 cm        Longitudinal                                Strain                                2D Strain GLS  -22.6 % LV Volumes (MOD)               Avg: LV vol d, MOD    71.9 ml A2C:                           3D Volume EF LV vol d, MOD    76.0  ml       LV 3D EF:    Left A4C:                                        ventricul LV vol s, MOD    25.3 ml                    ar A2C:                                        ejection LV vol s, MOD    29.1 ml                    fraction A4C:                                        by 3D LV SV MOD A2C:   46.6 ml  volume is LV SV MOD A4C:   76.0 ml                    64 %. LV SV MOD BP:    51.5 ml                                 3D Volume EF:                                3D EF:        64 %                                LV EDV:       124 ml                                LV ESV:       45 ml                                LV SV:        79 ml RIGHT VENTRICLE             IVC RV S prime:     12.20 cm/s  IVC diam: 1.70 cm TAPSE (M-mode): 2.3 cm LEFT ATRIUM             Index        RIGHT ATRIUM           Index LA diam:        2.70 cm 1.68 cm/m   RA Area:     11.10 cm LA Vol (A2C):   54.4 ml 33.90 ml/m  RA Volume:   22.90 ml  14.27 ml/m LA Vol (A4C):   19.3 ml 12.03 ml/m LA Biplane Vol: 35.8 ml 22.31 ml/m  AORTIC VALVE LVOT Vmax:   108.00 cm/s LVOT Vmean:  67.800 cm/s LVOT VTI:    0.212 m  AORTA Ao Root diam: 3.10 cm Ao Asc diam:  3.10 cm MITRAL VALVE MV Area (PHT): 3.88 cm     SHUNTS MV Decel Time: 196 msec     Systemic VTI:  0.21 m MV E velocity: 95.15 cm/s   Systemic Diam: 2.30 cm MV A velocity: 108.00 cm/s MV E/A ratio:  0.88 Epifanio Lesches MD Electronically signed by Epifanio Lesches MD Signature Date/Time: 12/05/2022/6:06:56 PM    Final         PHYSICAL EXAMINATION: ECOG PERFORMANCE STATUS: 0 - Asymptomatic  Vitals:   12/27/22 1103  BP: 136/84  Pulse: 80  Resp: 18  Temp: 98.3 F (36.8 C)  SpO2: 99%   Filed Weights   12/27/22 1103  Weight: 139 lb 3.2 oz (63.1 kg)    GENERAL:alert, no distress and comfortable  NEURO: alert & oriented x 3 with fluent speech, no focal motor/sensory deficits  LABORATORY DATA:  I have reviewed the data as listed    Component Value  Date/Time   NA 139 12/27/2022 1033   NA 140 11/02/2016 1130   K 3.6 12/27/2022 1033   K 3.8 11/02/2016 1130   CL 106 12/27/2022 1033   CL 105 05/02/2012 0822   CO2  26 12/27/2022 1033   CO2 26 11/02/2016 1130   GLUCOSE 103 (H) 12/27/2022 1033   GLUCOSE 87 11/02/2016 1130   GLUCOSE 98 05/02/2012 0822   BUN 19 12/27/2022 1033   BUN 14.7 11/02/2016 1130   CREATININE 1.04 (H) 12/27/2022 1033   CREATININE 0.84 12/05/2018 1014   CREATININE 0.8 11/02/2016 1130   CALCIUM 9.0 12/27/2022 1033   CALCIUM 9.7 11/02/2016 1130   PROT 6.3 (L) 12/27/2022 1033   PROT 6.8 11/02/2016 1130   ALBUMIN 3.6 12/27/2022 1033   ALBUMIN 3.6 11/02/2016 1130   AST 25 12/27/2022 1033   AST 23 11/02/2016 1130   ALT 15 12/27/2022 1033   ALT 17 11/02/2016 1130   ALKPHOS 93 12/27/2022 1033   ALKPHOS 59 11/02/2016 1130   BILITOT 0.4 12/27/2022 1033   BILITOT 0.34 11/02/2016 1130   GFRNONAA 56 (L) 12/27/2022 1033   GFRAA >60 11/08/2017 0504   GFRAA >60 07/25/2017 1425    No results found for: "SPEP", "UPEP"  Lab Results  Component Value Date   WBC 3.0 (L) 12/27/2022   NEUTROABS 1.6 (L) 12/27/2022   HGB 10.9 (L) 12/27/2022   HCT 31.6 (L) 12/27/2022   MCV 96.0 12/27/2022   PLT 230 12/27/2022      Chemistry      Component Value Date/Time   NA 139 12/27/2022 1033   NA 140 11/02/2016 1130   K 3.6 12/27/2022 1033   K 3.8 11/02/2016 1130   CL 106 12/27/2022 1033   CL 105 05/02/2012 0822   CO2 26 12/27/2022 1033   CO2 26 11/02/2016 1130   BUN 19 12/27/2022 1033   BUN 14.7 11/02/2016 1130   CREATININE 1.04 (H) 12/27/2022 1033   CREATININE 0.84 12/05/2018 1014   CREATININE 0.8 11/02/2016 1130      Component Value Date/Time   CALCIUM 9.0 12/27/2022 1033   CALCIUM 9.7 11/02/2016 1130   ALKPHOS 93 12/27/2022 1033   ALKPHOS 59 11/02/2016 1130   AST 25 12/27/2022 1033   AST 23 11/02/2016 1130   ALT 15 12/27/2022 1033   ALT 17 11/02/2016 1130   BILITOT 0.4 12/27/2022 1033   BILITOT 0.34  11/02/2016 1130       RADIOGRAPHIC STUDIES: I have personally reviewed the radiological images as listed and agreed with the findings in the report. CT ABDOMEN PELVIS W CONTRAST Result Date: 12/07/2022 CLINICAL DATA:  Uterine sarcoma, assess treatment response. * Tracking Code: BO * EXAM: CT ABDOMEN AND PELVIS WITH CONTRAST TECHNIQUE: Multidetector CT imaging of the abdomen and pelvis was performed using the standard protocol following bolus administration of intravenous contrast. RADIATION DOSE REDUCTION: This exam was performed according to the departmental dose-optimization program which includes automated exposure control, adjustment of the mA and/or kV according to patient size and/or use of iterative reconstruction technique. CONTRAST:  OMNIPAQUE IOHEXOL 300 MG/ML  SOLN COMPARISON:  09/08/2022. FINDINGS: Lower chest: Mild scattered pulmonary parenchymal scarring. No acute findings. Heart is enlarged. No pericardial or pleural effusion. Distal esophagus is grossly unremarkable. Hepatobiliary: Tiny low-attenuation lesion in the left hepatic lobe, too small to characterize. Liver and gallbladder are otherwise unremarkable. No biliary ductal dilatation. Pancreas: Negative. Spleen: Tiny low-attenuation lesions in the spleen, likely cysts. Adrenals/Urinary Tract: Adrenal glands are unremarkable. Millimetric low-attenuation lesions in the kidneys, too small to characterize. No specific follow-up necessary. Kidneys are otherwise unremarkable. Ureters are decompressed. Bladder is grossly unremarkable. Stomach/Bowel: Stomach, small bowel, appendix and colon are unremarkable. Vascular/Lymphatic: Atherosclerotic calcification of aorta. Generally similar  abdominal and pelvic retroperitoneal lymph nodes with index heterogeneous 11 mm left periaortic lymph node (2/33), stable. Aortocaval lymph node has decreased minimally in size in the interval, now measuring 10 mm (2/35), previously 12 mm. Right external  iliac lymph node has also decreased minimally in size, now 9 mm (2/58), previously 14 mm. 10 mm lymph node in the sigmoid mesocolon (2/65), stable. Reproductive: Hysterectomy. Left adnexal heterogeneous cystic and solid mass is stable in size, 3.0 x 3.4 cm (2/54). Other: No free fluid.  Small bilateral inguinal hernias contain fat. Musculoskeletal: Degenerative changes in the spine. No worrisome lytic or sclerotic lesions. Dextroconvex scoliosis. IMPRESSION: 1. Slight response to therapy as evidenced by decrease in size of aortocaval and right external iliac lymph nodes. Left adnexal metastasis, adjacent lymph node in the sigmoid mesocolon and other abdominopelvic retroperitoneal lymph nodes are stable. 2.  Aortic atherosclerosis (ICD10-I70.0). Electronically Signed   By: Leanna Battles M.D.   On: 12/07/2022 09:37   ECHOCARDIOGRAM COMPLETE Result Date: 12/05/2022    ECHOCARDIOGRAM REPORT   Patient Name:   CYNDRA GUNDY [facility] Date of Exam: 12/05/2022 Medical Rec #:  [id number]      Height:       59.5 in Accession #:    [id number]     Weight:       142.2 lb Date of Birth:  [birth year]      BSA:          1.605 m Patient Age:    76 years       BP:           153/72 mmHg Patient Gender: F              HR:           67 bpm. Exam Location:  Outpatient Procedure: 2D Echo, 3D Echo, Color Doppler, Cardiac Doppler and Strain Analysis Indications:    Z51.11 Encounter for antineoplastic chemotheraphy  History:        Patient has prior history of Echocardiogram examinations, most                 recent 07/27/2022. Signs/Symptoms:Bacteremia; Risk                 Factors:Dyslipidemia. Breast cancer.  Sonographer:    Sheralyn Boatman RDCS Referring Phys: [id number] Kayin Kettering IMPRESSIONS  1. Left ventricular ejection fraction, by estimation, is 60 to 65%. Left ventricular ejection fraction by 3D volume is 64 %. The left ventricle has normal function. The left ventricle has no regional wall motion abnormalities. Left ventricular diastolic   parameters were normal. The average left ventricular global longitudinal strain is -22.6 %. The global longitudinal strain is normal.  2. Right ventricular systolic function is normal. The right ventricular size is normal. Tricuspid regurgitation signal is inadequate for assessing PA pressure.  3. The mitral valve is normal in structure. No evidence of mitral valve regurgitation. No evidence of mitral stenosis.  4. The aortic valve is tricuspid. Aortic valve regurgitation is not visualized. No aortic stenosis is present.  5. The inferior vena cava is normal in size with greater than 50% respiratory variability, suggesting right atrial pressure of 3 mmHg. FINDINGS  Left Ventricle: Left ventricular ejection fraction, by estimation, is 60 to 65%. Left ventricular ejection fraction by 3D volume is 64 %. The left ventricle has normal function. The left ventricle has no regional wall motion abnormalities. The average left ventricular global longitudinal strain is -22.6 %. The global longitudinal strain is normal.  The left ventricular internal cavity size was normal in size. There is no left ventricular hypertrophy. Left ventricular diastolic parameters were normal. Right Ventricle: The right ventricular size is normal. No increase in right ventricular wall thickness. Right ventricular systolic function is normal. Tricuspid regurgitation signal is inadequate for assessing PA pressure. Left Atrium: Left atrial size was normal in size. Right Atrium: Right atrial size was normal in size. Pericardium: There is no evidence of pericardial effusion. Mitral Valve: The mitral valve is normal in structure. No evidence of mitral valve regurgitation. No evidence of mitral valve stenosis. Tricuspid Valve: The tricuspid valve is normal in structure. Tricuspid valve regurgitation is trivial. Aortic Valve: The aortic valve is tricuspid. Aortic valve regurgitation is not visualized. No aortic stenosis is present. Pulmonic Valve: The  pulmonic valve was not well visualized. Pulmonic valve regurgitation is not visualized. Aorta: The aortic root and ascending aorta are structurally normal, with no evidence of dilitation. Venous: The inferior vena cava is normal in size with greater than 50% respiratory variability, suggesting right atrial pressure of 3 mmHg. IAS/Shunts: The interatrial septum was not well visualized.  LEFT VENTRICLE PLAX 2D LVIDd:         4.20 cm         Diastology LVIDs:         2.80 cm         LV e' medial:    7.51 cm/s LV PW:         0.90 cm         LV E/e' medial:  12.7 LV IVS:        0.80 cm         LV e' lateral:   9.68 cm/s LVOT diam:     2.30 cm         LV E/e' lateral: 9.8 LV SV:         88 LV SV Index:   55              2D LVOT Area:     4.15 cm        Longitudinal                                Strain                                2D Strain GLS  -22.6 % LV Volumes (MOD)               Avg: LV vol d, MOD    71.9 ml A2C:                           3D Volume EF LV vol d, MOD    76.0 ml       LV 3D EF:    Left A4C:                                        ventricul LV vol s, MOD    25.3 ml                    ar A2C:  ejection LV vol s, MOD    29.1 ml                    fraction A4C:                                        by 3D LV SV MOD A2C:   46.6 ml                    volume is LV SV MOD A4C:   76.0 ml                    64 %. LV SV MOD BP:    51.5 ml                                 3D Volume EF:                                3D EF:        64 %                                LV EDV:       124 ml                                LV ESV:       45 ml                                LV SV:        79 ml RIGHT VENTRICLE             IVC RV S prime:     12.20 cm/s  IVC diam: 1.70 cm TAPSE (M-mode): 2.3 cm LEFT ATRIUM             Index        RIGHT ATRIUM           Index LA diam:        2.70 cm 1.68 cm/m   RA Area:     11.10 cm LA Vol (A2C):   54.4 ml 33.90 ml/m  RA Volume:   22.90 ml  14.27  ml/m LA Vol (A4C):   19.3 ml 12.03 ml/m LA Biplane Vol: 35.8 ml 22.31 ml/m  AORTIC VALVE LVOT Vmax:   108.00 cm/s LVOT Vmean:  67.800 cm/s LVOT VTI:    0.212 m  AORTA Ao Root diam: 3.10 cm Ao Asc diam:  3.10 cm MITRAL VALVE MV Area (PHT): 3.88 cm     SHUNTS MV Decel Time: 196 msec     Systemic VTI:  0.21 m MV E velocity: 95.15 cm/s   Systemic Diam: 2.30 cm MV A velocity: 108.00 cm/s MV E/A ratio:  0.88 Epifanio Lesches MD Electronically signed by Epifanio Lesches MD Signature Date/Time: 12/05/2022/6:06:56 PM    Final

## 2022-12-28 ENCOUNTER — Other Ambulatory Visit: Payer: Self-pay | Admitting: Nurse Practitioner

## 2023-01-16 MED FILL — Fosaprepitant Dimeglumine For IV Infusion 150 MG (Base Eq): INTRAVENOUS | Qty: 5 | Status: AC

## 2023-01-17 ENCOUNTER — Inpatient Hospital Stay: Payer: Medicare HMO

## 2023-01-17 ENCOUNTER — Encounter: Payer: Self-pay | Admitting: Hematology and Oncology

## 2023-01-17 ENCOUNTER — Telehealth: Payer: Self-pay | Admitting: Hematology and Oncology

## 2023-01-17 ENCOUNTER — Inpatient Hospital Stay: Payer: Medicare HMO | Attending: Hematology and Oncology

## 2023-01-17 ENCOUNTER — Inpatient Hospital Stay: Payer: Medicare HMO | Attending: Hematology and Oncology | Admitting: Hematology and Oncology

## 2023-01-17 VITALS — BP 157/88 | HR 81 | Temp 98.1°F | Resp 18 | Ht 59.5 in | Wt 138.8 lb

## 2023-01-17 DIAGNOSIS — C549 Malignant neoplasm of corpus uteri, unspecified: Secondary | ICD-10-CM

## 2023-01-17 DIAGNOSIS — D61818 Other pancytopenia: Secondary | ICD-10-CM

## 2023-01-17 DIAGNOSIS — Z5112 Encounter for antineoplastic immunotherapy: Secondary | ICD-10-CM | POA: Diagnosis not present

## 2023-01-17 DIAGNOSIS — C778 Secondary and unspecified malignant neoplasm of lymph nodes of multiple regions: Secondary | ICD-10-CM | POA: Diagnosis not present

## 2023-01-17 DIAGNOSIS — C541 Malignant neoplasm of endometrium: Secondary | ICD-10-CM | POA: Diagnosis not present

## 2023-01-17 LAB — CMP (CANCER CENTER ONLY)
ALT: 17 U/L (ref 0–44)
AST: 27 U/L (ref 15–41)
Albumin: 3.7 g/dL (ref 3.5–5.0)
Alkaline Phosphatase: 105 U/L (ref 38–126)
Anion gap: 7 (ref 5–15)
BUN: 16 mg/dL (ref 8–23)
CO2: 27 mmol/L (ref 22–32)
Calcium: 9.1 mg/dL (ref 8.9–10.3)
Chloride: 107 mmol/L (ref 98–111)
Creatinine: 0.95 mg/dL (ref 0.44–1.00)
GFR, Estimated: >60 mL/min (ref >60)
Glucose, Bld: 96 mg/dL (ref 70–99)
Potassium: 3.5 mmol/L (ref 3.5–5.1)
Sodium: 141 mmol/L (ref 135–145)
Total Bilirubin: 0.3 mg/dL (ref 0.0–1.2)
Total Protein: 6.9 g/dL (ref 6.5–8.1)

## 2023-01-17 LAB — CBC WITH DIFFERENTIAL (CANCER CENTER ONLY)
Abs Immature Granulocytes: 0.01 10*3/uL (ref 0.00–0.07)
Basophils Absolute: 0 10*3/uL (ref 0.0–0.1)
Basophils Relative: 1 %
Eosinophils Absolute: 0.1 10*3/uL (ref 0.0–0.5)
Eosinophils Relative: 4 %
HCT: 34.2 % — ABNORMAL LOW (ref 36.0–46.0)
Hemoglobin: 11.4 g/dL — ABNORMAL LOW (ref 12.0–15.0)
Immature Granulocytes: 0 %
Lymphocytes Relative: 25 %
Lymphs Abs: 0.9 10*3/uL (ref 0.7–4.0)
MCH: 32.9 pg (ref 26.0–34.0)
MCHC: 33.3 g/dL (ref 30.0–36.0)
MCV: 98.8 fL (ref 80.0–100.0)
Monocytes Absolute: 0.6 10*3/uL (ref 0.1–1.0)
Monocytes Relative: 16 %
Neutro Abs: 2 10*3/uL (ref 1.7–7.7)
Neutrophils Relative %: 54 %
Platelet Count: 229 10*3/uL (ref 150–400)
RBC: 3.46 MIL/uL — ABNORMAL LOW (ref 3.87–5.11)
RDW: 17.8 % — ABNORMAL HIGH (ref 11.5–15.5)
WBC Count: 3.7 10*3/uL — ABNORMAL LOW (ref 4.0–10.5)
nRBC: 0 % (ref 0.0–0.2)

## 2023-01-17 MED ORDER — SODIUM CHLORIDE 0.9 % IV SOLN
150.0000 mg | Freq: Once | INTRAVENOUS | Status: AC
  Administered 2023-01-17: 150 mg via INTRAVENOUS
  Filled 2023-01-17: qty 150

## 2023-01-17 MED ORDER — DEXTROSE 5 % IV SOLN: Freq: Once | INTRAVENOUS | Status: AC

## 2023-01-17 MED ORDER — SODIUM CHLORIDE 0.9% FLUSH
10.0000 mL | INTRAVENOUS | Status: DC | PRN
  Administered 2023-01-17: 10 mL

## 2023-01-17 MED ORDER — FAM-TRASTUZUMAB DERUXTECAN-NXKI CHEMO 100 MG IV SOLR
5.4000 mg/kg | Freq: Once | INTRAVENOUS | Status: AC
  Administered 2023-01-17: 340 mg via INTRAVENOUS
  Filled 2023-01-17: qty 17

## 2023-01-17 MED ORDER — HEPARIN SOD (PORK) LOCK FLUSH 100 UNIT/ML IV SOLN
500.0000 [IU] | Freq: Once | INTRAVENOUS | Status: AC | PRN
  Administered 2023-01-17: 500 [IU]

## 2023-01-17 MED ORDER — PALONOSETRON HCL INJECTION 0.25 MG/5ML
0.2500 mg | Freq: Once | INTRAVENOUS | Status: AC
  Administered 2023-01-17: 0.25 mg via INTRAVENOUS
  Filled 2023-01-17: qty 5

## 2023-01-17 MED ORDER — SODIUM CHLORIDE 0.9% FLUSH
10.0000 mL | Freq: Once | INTRAVENOUS | Status: AC
  Administered 2023-01-17: 10 mL

## 2023-01-17 MED ORDER — DEXAMETHASONE SODIUM PHOSPHATE 10 MG/ML IJ SOLN
10.0000 mg | Freq: Once | INTRAMUSCULAR | Status: AC
Start: 2023-01-17 — End: 2023-01-17
  Administered 2023-01-17: 10 mg via INTRAVENOUS
  Filled 2023-01-17: qty 1

## 2023-01-17 NOTE — Assessment & Plan Note (Signed)
 Overall, she tolerated treatment well without major side effects I plan to repeat imaging study next month

## 2023-01-17 NOTE — Assessment & Plan Note (Signed)
 She has stable pancytopenia due to treatment She is not symptomatic We will proceed with treatment without delay

## 2023-01-17 NOTE — Progress Notes (Signed)
 [facility] OFFICE PROGRESS NOTE  Patient Care Team: Viktoria Comer SAUNDERS, MD as PCP - General (Gynecologic Oncology) Watt Mirza, MD as Consulting Physician [facility])  ASSESSMENT & PLAN:  Carcinosarcoma of body of uterus (HCC) Overall, she tolerated treatment well without major side effects I plan to repeat imaging study next month  Pancytopenia, acquired [facility]) She has stable pancytopenia due to treatment She is not symptomatic We will proceed with treatment without delay  Orders Placed This Encounter  Procedures   CBC with Differential (Cancer Center Only)    Standing Status:   Future    Expected Date:   02/07/2023    Expiration Date:   02/07/2024   CMP (Cancer Center only)    Standing Status:   Future    Expected Date:   02/07/2023    Expiration Date:   02/07/2024   CBC with Differential (Cancer Center Only)    Standing Status:   Future    Expected Date:   02/28/2023    Expiration Date:   02/28/2024   CMP (Cancer Center only)    Standing Status:   Future    Expected Date:   02/28/2023    Expiration Date:   02/28/2024    All questions were answered. The patient knows to call the clinic with any problems, questions or concerns. The total time spent in the appointment was 25 minutes encounter with patients including review of chart and various tests results, discussions about plan of care and coordination of care plan   Almarie Bedford, MD 01/17/2023 10:49 AM  INTERVAL HISTORY: Please see below for problem oriented charting. she returns for treatment follow-up She is doing well She had rare occasional diarrhea No nausea Overall, she tolerated treatment well No other major side effects  REVIEW OF SYSTEMS:   Constitutional: Denies fevers, chills or abnormal weight loss Eyes: Denies blurriness of vision Ears, nose, mouth, throat, and face: Denies mucositis or sore throat Respiratory: Denies cough, dyspnea or wheezes Cardiovascular: Denies palpitation,  chest discomfort or lower extremity swelling Skin: Denies abnormal skin rashes Lymphatics: Denies new lymphadenopathy or easy bruising Neurological:Denies numbness, tingling or new weaknesses Behavioral/Psych: Mood is stable, no new changes  All other systems were reviewed with the patient and are negative.  I have reviewed the past medical history, past surgical history, social history and family history with the patient and they are unchanged from previous note.  ALLERGIES:  is allergic to tylenol  [acetaminophen ] and latex.  MEDICATIONS:  Current Outpatient Medications  Medication Sig Dispense Refill   Cyanocobalamin  (VITAMIN B 12 PO) Take 1 tablet by mouth.     escitalopram  (LEXAPRO ) 10 MG tablet TAKE 1 TABLET EVERY DAY 90 tablet 0   escitalopram  (LEXAPRO ) 20 MG tablet Take 1 tablet (20 mg total) by mouth daily. 90 tablet 0   Magnesium 200 MG TABS Take 2 tablets by mouth daily.     ondansetron  (ZOFRAN ) 8 MG tablet Take 1 tablet (8 mg total) by mouth every 8 (eight) hours as needed for nausea or vomiting. Start on the third day after chemotherapy. 30 tablet 1   pantoprazole  (PROTONIX ) 40 MG tablet Take 1 tablet (40 mg total) by mouth daily. 90 tablet 1   prochlorperazine  (COMPAZINE ) 10 MG tablet Take 1 tablet (10 mg total) by mouth every 6 (six) hours as needed for nausea or vomiting. 30 tablet 1   traMADol  (ULTRAM ) 50 MG tablet Take 1 tablet (50 mg total) by mouth every 6 (six) hours as needed.  60 tablet 0   VITAMIN D , CHOLECALCIFEROL, PO Take 1 tablet by mouth daily.     No current facility-administered medications for this visit.    SUMMARY OF ONCOLOGIC HISTORY: Oncology History Overview Note  MMR normal MSI stable PD-L1 CPS: 1% Her2 positive P53 + Carcinosarcoma Neg genetics ER negative   Carcinosarcoma of body of uterus (HCC)  09/23/2021 Initial Diagnosis   She presented with PMB   09/28/2021 Imaging   US  pelvis Enlarged uterus with fibroids. Pelvic sonogram is  otherwise unremarkable.    11/11/2021 Pathology Results   A. CERVIX, MASS, EXCISION:  - HIGH-GRADE UNDIFFERENTIATED MALIGNANCY WITH EXTENSIVE NECROSIS (SEE COMMENT).   COMMENT:  The tumor consists of mostly high-grade sarcomatoid morphology with focal epithelioid morphology.  Immunohistochemical stains are performed on block A4.  The tumor cells are positive for desmin and p16 while negative for CD45, CK7, CK20, chromogranin, synaptophysin, Melan-A, p40, AE1/AE3, SMA, p63, cyclin D1, S100 and smooth muscle myosin.  Ki-67 proliferation index is high.  CD10 staining shows focal nonspecific staining.  The differential diagnosis includes a high-grade sarcoma and a partially sampled carcinosarcoma.    11/25/2021 Imaging   MRI pelvis Marked distention of the entire endometrial cavity by heterogeneously enhancing soft tissue density, which extends through the endocervical canal into the lower vagina. This is highly suspicious for endometrial carcinoma.   Diffuse myometrial thinning due to marked distention of endometrial cavity limits evaluation; deep myometrial invasion cannot be excluded in the uterine fundus.   No evidence of extra-uterine extension of tumor.   Lymphadenopathy in lower abdominal retroperitoneum, bilateral iliac chains, and sigmoid mesocolon, consistent with metastatic disease.   Sigmoid diverticulosis. No radiographic evidence of diverticulitis.   11/26/2021 Initial Diagnosis   Uterine cancer (HCC)   11/26/2021 Cancer Staging   Staging form: Corpus Uteri - Carcinoma and Carcinosarcoma, AJCC 8th Edition - Clinical stage from 11/26/2021: FIGO Stage IIIC2 (cT3, cN2, cM0) - Signed by Lonn Hicks, MD on 11/26/2021 Stage prefix: Initial diagnosis Histologic grade (G): G3 Histologic grading system: 3 grade system   11/30/2021 Pathology Results   FINAL MICROSCOPIC DIAGNOSIS:  A. UTERINE CONTENTS, BIOPSY: - HIGH-GRADE UNDIFFERENTIATED MALIGNANCY WITH EXTENSIVE NECROSIS (SEE  COMMENT).  COMMENT: The patient's history of a high-grade undifferentiated malignancy with extensive necrosis of the cervix is noted.  The morphological features of the current tumor cells are similar to the tumor cells seen in the previous cervical mass excision.  Immunohistochemical staining for desmin and cytokeratin AE1/AE3 is performed on block A1.  The tumor cells are diffusely positive for desmin.   AE1/AE3 stain is focally positive, however this is scant and of unclear clinical significance. Given the small quantity of tissue in the current biopsy, a definitive distinction between a high-grade sarcoma and carcinosarcoma cannot be made as this biopsy may not be representative of the entire tumor. If clinically indicated, this case as well as the previous cervical mass excision ([id number]) may be sent out for expert consultation. Clinical correlation recommended.     12/01/2021 Tumor Marker   Patient's tumor was tested for the following markers: CA-125. Results of the tumor marker test revealed 491.   12/22/2021 Surgery   Robotic-assisted laparoscopic total hysterectomy with bilateral salpingo-oophorectomy, tumor debulking including high common iliac lymph node excision, aborted attempt of bilateral external iliac enlarged lymph nodes, mini-lap for specimen delivery, cystoscopy    12/22/2021 Pathology Results   FINAL MICROSCOPIC DIAGNOSIS:  A. LEFT CERVICAL MARGIN, EXCISION: - Positive for carcinoma  B. HIGH LEFT COMMON ILIAC  LYMPH NODE, EXCISION: - Metastatic carcinosarcoma involving the lymph node  C. 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, including undifferentiated sarcoma with rhabdoid features and high-grade serous carcinoma, see comment - Tumor invades for a depth of 1 mm where myometrial thickness is 5 mm - Benign bilateral fallopian tubes and ovaries - See oncology table - See  comment  ONCOLOGY TABLE:  UTERUS, CARCINOMA OR CARCINOSARCOMA: Resection  Procedure: Total hysterectomy and bilateral salpingo-oophorectomy Histologic Type: Carcinosarcoma (mixed malignant Mullerian tumor) Histologic Grade: High-grade Myometrial Invasion:      Depth of Myometrial Invasion (mm): 1 mm      Myometrial Thickness (mm): 5 mm      Percentage of Myometrial Invasion: 20% Uterine Serosa Involvement: Not identified Cervical stromal Involvement: Present Extent of involvement of other tissue/organs: Not identified Peritoneal/Ascitic Fluid: Not applicable Lymphovascular Invasion: Present Regional Lymph Nodes:      Pelvic Lymph Nodes Examined:                                  0 Sentinel                                  1 Non-sentinel                                  1 Total      Pelvic Lymph Nodes with Metastasis: 1                          Macrometastasis: (>2.0 mm): 1                          Micrometastasis: (>0.2 mm and < 2.0 mm): 0                          Isolated Tumor Cells (<0.2 mm): 0                          Laterality of Lymph Node with Tumor: Left                          Extracapsular Extension: Not identified      Para-aortic Lymph Nodes Examined:                                   0 Sentinel                                   0 non-sentinel                                   0 total Distant Metastasis:      Distant Site(s) Involved: Not applicable Pathologic Stage Classification (pTNM, AJCC 8th Edition): pT1a, pN1 Ancillary Studies: MMR / MSI testing will be ordered Representative Tumor Block: C1 Comment(s): None  ([id number])       01/13/2022 Procedure  Placement of single lumen port a cath via right internal jugular vein. The catheter tip lies at the cavo-atrial junction. A power injectable port a cath was placed and is ready for immediate use.     01/19/2022 Echocardiogram    1. Left ventricular ejection fraction, by estimation, is 60 to 65%. The left  ventricle has normal function. The left ventricle has no regional wall motion abnormalities. Left ventricular diastolic parameters are consistent with Grade I diastolic dysfunction (impaired relaxation). The average left ventricular global longitudinal strain is -24.0 %. The global longitudinal strain is normal.  2. Right ventricular systolic function is normal. The right ventricular size is normal. There is normal pulmonary artery systolic pressure.  3. Left atrial size was mildly dilated.  4. The mitral valve is normal in structure. No evidence of mitral valve regurgitation. No evidence of mitral stenosis.  5. The aortic valve was not well visualized. Aortic valve regurgitation is not visualized. Aortic valve sclerosis is present, with no evidence of aortic valve stenosis.  6. The inferior vena cava is normal in size with greater than 50% respiratory variability, suggesting right atrial pressure of 3 mmHg.     01/27/2022 Tumor Marker   Patient's tumor was tested for the following markers: CA-125. Results of the tumor marker test revealed 307.   02/14/2022 - 08/22/2022 Chemotherapy   Patient is on Treatment Plan : UTERINE SEROUS CARCINOMA Carboplatin  + Paclitaxel  + Trastuzumab  q21d x 6 Cycles / Trastuzumab  q21d     02/15/2022 Tumor Marker   Patient's tumor was tested for the following markers: CA-125. Results of the tumor marker test revealed 161.   03/29/2022 Tumor Marker   Patient's tumor was tested for the following markers: CA-125. Results of the tumor marker test revealed 30.   04/13/2022 Echocardiogram   1. Left ventricular ejection fraction, by estimation, is 60 to 65%. The left ventricle has normal function. The left ventricle has no regional wall motion abnormalities. Left ventricular diastolic parameters are consistent with Grade I diastolic dysfunction (impaired relaxation). The average left ventricular global longitudinal strain is -19.4 %. The global longitudinal strain is normal.  2.  Right ventricular systolic function is normal. The right ventricular size is normal. There is normal pulmonary artery systolic pressure. The estimated right ventricular systolic pressure is 21.0 mmHg.  3. Left atrial size was mild to moderately dilated.  4. The mitral valve is grossly normal. Trivial mitral valve regurgitation.  5. The aortic valve is tricuspid. Aortic valve regurgitation is not visualized. Aortic valve sclerosis is present, with no evidence of aortic valve stenosis.  6. The inferior vena cava is normal in size with greater than 50% respiratory variability, suggesting right atrial pressure of 3 mmHg.   06/01/2022 Tumor Marker   Patient's tumor was tested for the following markers: CA-125. Results of the tumor marker test revealed 8.   06/05/2022 Genetic Testing   Negative genetic testing on the Multi-Cancer gene panel + RNA.  The report date is Jun 05, 2022.  The Multi-Cancer + RNA Panel offered by [facility] includes sequencing and/or deletion/duplication analysis of the following 70 genes:  AIP*, ALK, APC*, ATM*, AXIN2*, BAP1*, BARD1*, BLM*, BMPR1A*, BRCA1*, BRCA2*, BRIP1*, CDC73*, CDH1*, CDK4, CDKN1B*, CDKN2A, CHEK2*, CTNNA1*, DICER1*, EPCAM (del/dup only), EGFR, FH*, FLCN*, GREM1 (promoter dup only), HOXB13, KIT, LZTR1, MAX*, MBD4, MEN1*, MET, MITF, MLH1*, MSH2*, MSH3*, MSH6*, MUTYH*, NF1*, NF2*, NTHL1*, PALB2*, PDGFRA, PMS2*, POLD1*, POLE*, POT1*, PRKAR1A*, PTCH1*, PTEN*, RAD51C*, RAD51D*, RB1*, RET, SDHA* (sequencing only), SDHAF2*, SDHB*, SDHC*, SDHD*,  SMAD4*, SMARCA4*, SMARCB1*, SMARCE1*, STK11*, SUFU*, TMEM127*, TP53*, TSC1*, TSC2*, VHL*. RNA analysis is performed for * genes.    06/20/2022 Imaging   1. Enlarged periaortic and iliac lymph nodes consistent metastatic disease. Bulky iliac node on the LEFT. 2. No evidence of peritoneal metastasis. 3. Post hysterectomy and oophorectomy.   07/13/2022 Tumor Marker   Patient's tumor was tested for the following markers:  CA-125. Results of the tumor marker test revealed 8.8.   07/27/2022 Echocardiogram   ECHOCARDIOGRAM COMPLETE  Result Date: 07/27/2022    ECHOCARDIOGRAM REPORT   Patient Name:   FABIANA DROMGOOLE Holdren Date of Exam: 07/27/2022 Medical Rec #:  [id number]      Height:       59.5 in Accession #:    [id number]     Weight:       140.4 lb Date of Birth:  1945/05/23      BSA:          1.596 m Patient Age:    78 years       BP:           135/92 mmHg Patient Gender: F              HR:           69 bpm. Exam Location:  Outpatient Procedure: 2D Echo, Color Doppler, Cardiac Doppler and Strain Analysis Indications:    Chemo  History:        Patient has prior history of Echocardiogram examinations, most                 recent 04/13/2022. Breast Cancer, Uterine Cancer, CKD; Risk                 Factors:Dyslipidemia.  Sonographer:    Eleanor Lincoln Referring Phys: [id number] Madysyn Hanken  Sonographer Comments: Global longitudinal strain was attempted. IMPRESSIONS  1. Prior GLS was -19.4 on 04/13/22 suggest f/u echo in 3 months to make sure not seeing early myocardial issues . Left ventricular ejection fraction, by estimation, is 55 to 60%. The left ventricle has normal function. The left ventricle has no regional wall motion abnormalities. Left ventricular diastolic parameters were normal. The average left ventricular global longitudinal strain is -15.9 %. The global longitudinal strain is normal.  2. Right ventricular systolic function is normal. The right ventricular size is normal.  3. The mitral valve is normal in structure. No evidence of mitral valve regurgitation. No evidence of mitral stenosis.  4. The aortic valve was not well visualized. There is mild calcification of the aortic valve. Aortic valve regurgitation is not visualized. Aortic valve sclerosis is present, with no evidence of aortic valve stenosis.  5. The inferior vena cava is normal in size with greater than 50% respiratory variability, suggesting right atrial pressure of 3  mmHg. FINDINGS  Left Ventricle: Prior GLS was -19.4 on 04/13/22 suggest f/u echo in 3 months to make sure not seeing early myocardial issues. Left ventricular ejection fraction, by estimation, is 55 to 60%. The left ventricle has normal function. The left ventricle has no regional wall motion abnormalities. The average left ventricular global longitudinal strain is -15.9 %. The global longitudinal strain is normal. The left ventricular internal cavity size was normal in size. There is no left ventricular hypertrophy. Left ventricular diastolic parameters were normal. Right Ventricle: The right ventricular size is normal. No increase in right ventricular wall thickness. Right ventricular systolic function is normal. Left Atrium: Left atrial size was normal in size.  Right Atrium: Right atrial size was normal in size. Pericardium: There is no evidence of pericardial effusion. Mitral Valve: The mitral valve is normal in structure. No evidence of mitral valve regurgitation. No evidence of mitral valve stenosis. Tricuspid Valve: The tricuspid valve is normal in structure. Tricuspid valve regurgitation is not demonstrated. No evidence of tricuspid stenosis. Aortic Valve: The aortic valve was not well visualized. There is mild calcification of the aortic valve. Aortic valve regurgitation is not visualized. Aortic valve sclerosis is present, with no evidence of aortic valve stenosis. Aortic valve mean gradient measures 4.0 mmHg. Aortic valve peak gradient measures 8.2 mmHg. Aortic valve area, by VTI measures 1.40 cm. Pulmonic Valve: The pulmonic valve was normal in structure. Pulmonic valve regurgitation is not visualized. No evidence of pulmonic stenosis. Aorta: The aortic root is normal in size and structure. Venous: The inferior vena cava is normal in size with greater than 50% respiratory variability, suggesting right atrial pressure of 3 mmHg. IAS/Shunts: No atrial level shunt detected by color flow Doppler.  LEFT  VENTRICLE PLAX 2D LVIDd:         4.00 cm   Diastology LVIDs:         2.60 cm   LV e' medial:    5.77 cm/s LV PW:         0.90 cm   LV E/e' medial:  10.8 LV IVS:        0.80 cm   LV e' lateral:   13.50 cm/s LVOT diam:     1.70 cm   LV E/e' lateral: 4.6 LV SV:         41 LV SV Index:   26        2D Longitudinal Strain LVOT Area:     2.27 cm  2D Strain GLS Avg:     -15.9 %  RIGHT VENTRICLE RV Basal diam:  2.70 cm RV Mid diam:    2.10 cm RV S prime:     12.80 cm/s TAPSE (M-mode): 2.1 cm LEFT ATRIUM             Index        RIGHT ATRIUM          Index LA diam:        2.70 cm 1.69 cm/m   RA Area:     8.87 cm LA Vol (A2C):   35.4 ml 22.18 ml/m  RA Volume:   16.30 ml 10.21 ml/m LA Vol (A4C):   36.6 ml 22.93 ml/m LA Biplane Vol: 36.1 ml 22.62 ml/m  AORTIC VALVE AV Area (Vmax):    1.38 cm AV Area (Vmean):   1.35 cm AV Area (VTI):     1.40 cm AV Vmax:           143.00 cm/s AV Vmean:          93.400 cm/s AV VTI:            0.293 m AV Peak Grad:      8.2 mmHg AV Mean Grad:      4.0 mmHg LVOT Vmax:         87.00 cm/s LVOT Vmean:        55.700 cm/s LVOT VTI:          0.180 m LVOT/AV VTI ratio: 0.62  AORTA Ao Root diam: 2.80 cm Ao Asc diam:  3.00 cm MITRAL VALVE               TRICUSPID VALVE MV Area (  PHT): 3.30 cm    TR Peak grad:   21.7 mmHg MV Decel Time: 230 msec    TR Vmax:        233.00 cm/s MV E velocity: 62.10 cm/s MV A velocity: 87.80 cm/s  SHUNTS MV E/A ratio:  0.71        Systemic VTI:  0.18 m                            Systemic Diam: 1.70 cm Maude Emmer MD Electronically signed by Maude Emmer MD Signature Date/Time: 07/27/2022/10:27:37 AM    Final       08/23/2022 Tumor Marker   Patient's tumor was tested for the following markers: CA-125. Results of the tumor marker test revealed 7.2.   09/11/2022 Imaging   CT CHEST ABDOMEN PELVIS W CONTRAST  Result Date: 09/12/2022 CLINICAL DATA:  evaluate treatment response of uterine cancer/carcinosarcoma of uterus/endometrium. * Tracking Code: BO * EXAM: CT CHEST,  ABDOMEN, AND PELVIS WITH CONTRAST TECHNIQUE: Multidetector CT imaging of the chest, abdomen and pelvis was performed following the standard protocol during bolus administration of intravenous contrast. RADIATION DOSE REDUCTION: This exam was performed according to the departmental dose-optimization program which includes automated exposure control, adjustment of the mA and/or kV according to patient size and/or use of iterative reconstruction technique. CONTRAST:  80mL OMNIPAQUE  IOHEXOL  300 MG/ML  SOLN COMPARISON:  06/13/2022 abdominopelvic CT. Most recent chest CT 12/01/2021 FINDINGS: CT CHEST FINDINGS Cardiovascular: right Port-A-Cath terminates at the high right atrium. Aortic atherosclerosis. Normal heart size, without pericardial effusion. Lad coronary artery calcification. No central pulmonary embolism, on this non-dedicated study. Mediastinum/Nodes: 1.4 cm right thyroid  nodule. Not clinically significant; no follow-up imaging recommended (ref: J Am Coll Radiol. 2015 Feb;12(2): 143-50). No supraclavicular adenopathy. No axillary adenopathy. Right axillary node dissection. No mediastinal or hilar adenopathy. Esophageal fluid level on 23/2. Lungs/Pleura: No pleural fluid. Posterior central left upper lobe 7 x 6 mm nodule on 42/6 is similar to on image 46 of the prior. Musculoskeletal: Right breast implant. No acute osseous abnormality. CT ABDOMEN PELVIS FINDINGS Hepatobiliary: There may be a tiny cyst in segment 4A anteriorly. Normal gallbladder, without biliary ductal dilatation. Pancreas: Normal, without mass or ductal dilatation. Spleen: Normal in size, without focal abnormality. Adrenals/Urinary Tract: Normal adrenal glands. Normal kidneys, without hydronephrosis. Normal urinary bladder. Stomach/Bowel: Apparent proximal gastric wall thickening including on 43/2 is most likely due to underdistention. Normal distal stomach. Extensive colonic diverticulosis. Normal terminal ileum. Normal small bowel.  Vascular/Lymphatic: Aortic atherosclerosis. Index left periaortic node measures 1.2 x 1.7 cm on 71/2 versus 1.3 x 1.7 cm on the prior. A preaortic/pre caval 12 mm node on 72/2 is enlarged from 7 mm on the prior exam (when remeasured). More cephalad left periaortic node measures 1.0 cm on 60/2 versus 1.1 cm on the prior. Index right common iliac node measures 9 mm on 82/2 versus 13 mm on the prior. Right external iliac centrally necrotic node measures 1.4 cm on 95/2 versus 1.2 cm on the prior. Dominant left external iliac nodal mass measures 3.1 x 3.4 cm on 92/2 versus 2.3 x 2.8 cm on the prior exam Reproductive: Hysterectomy.  No adnexal mass. Other: No significant free fluid. No evidence of omental or peritoneal disease. Musculoskeletal: Osteopenia. S shaped thoracolumbar spine curvature. IMPRESSION: CT CHEST IMPRESSION 1. 7 x 6 mm left upper lobe pulmonary nodule is similar compared to 12/01/2021. Stability favors a benign etiology. Recommend attention on follow-up  to exclude unlikely indolent primary neoplasm or isolated metastasis. 2. No thoracic adenopathy. 3. Coronary artery atherosclerosis. Aortic Atherosclerosis (ICD10-I70.0). 4. Esophageal air fluid level suggests dysmotility or gastroesophageal reflux. CT ABDOMEN AND PELVIS IMPRESSION 1. Mixed response, with overall progression of nodal metastasis. Although some nodes are smaller, the dominant left pelvic sidewall nodal mass has significantly enlarged. 2. No new sites of metastatic disease identified. Electronically Signed   By: Rockey Kilts M.D.   On: 09/12/2022 11:07   MM 3D SCREENING MAMMOGRAM UNILATERAL LEFT BREAST  Result Date: 09/05/2022 CLINICAL DATA:  Screening. EXAM: DIGITAL SCREENING UNILATERAL LEFT MAMMOGRAM WITH CAD AND TOMOSYNTHESIS TECHNIQUE: Left screening digital craniocaudal and mediolateral oblique mammograms were obtained. Left screening digital breast tomosynthesis was performed. The images were evaluated with computer-aided  detection. COMPARISON:  Previous exam(s). ACR Breast Density Category c: The breasts are heterogeneously dense, which may obscure small masses. FINDINGS: The patient has had a right mastectomy. There are no findings suspicious for malignancy. IMPRESSION: No mammographic evidence of malignancy. A result letter of this screening mammogram will be mailed directly to the patient. RECOMMENDATION: Screening mammogram in one year.  (Code:SM-L-31M) BI-RADS CATEGORY  1: Negative. Electronically Signed   By: Dobrinka  Dimitrova M.D.   On: 09/05/2022 11:09      10/04/2022 - 10/04/2022 Chemotherapy   Patient is on Treatment Plan : Uterine Carboplatin  + Paclitaxel  + Bevacizumab q21d     10/04/2022 -  Chemotherapy   Patient is on Treatment Plan : BREAST METASTATIC Fam-Trastuzumab Deruxtecan-nxki  (Enhertu ) (5.4) q21d     11/19/2022 Tumor Marker   Patient's tumor was tested for the following markers: CA-125. Results of the tumor marker test revealed 8.5.   11/29/2022 Imaging   CT ABDOMEN PELVIS W CONTRAST  Result Date: 12/07/2022 CLINICAL DATA:  Uterine sarcoma, assess treatment response. * Tracking Code: BO * EXAM: CT ABDOMEN AND PELVIS WITH CONTRAST TECHNIQUE: Multidetector CT imaging of the abdomen and pelvis was performed using the standard protocol following bolus administration of intravenous contrast. RADIATION DOSE REDUCTION: This exam was performed according to the departmental dose-optimization program which includes automated exposure control, adjustment of the mA and/or kV according to patient size and/or use of iterative reconstruction technique. CONTRAST:  OMNIPAQUE  IOHEXOL  300 MG/ML  SOLN COMPARISON:  09/08/2022. FINDINGS: Lower chest: Mild scattered pulmonary parenchymal scarring. No acute findings. Heart is enlarged. No pericardial or pleural effusion. Distal esophagus is grossly unremarkable. Hepatobiliary: Tiny low-attenuation lesion in the left hepatic lobe, too small to characterize. Liver  and gallbladder are otherwise unremarkable. No biliary ductal dilatation. Pancreas: Negative. Spleen: Tiny low-attenuation lesions in the spleen, likely cysts. Adrenals/Urinary Tract: Adrenal glands are unremarkable. Millimetric low-attenuation lesions in the kidneys, too small to characterize. No specific follow-up necessary. Kidneys are otherwise unremarkable. Ureters are decompressed. Bladder is grossly unremarkable. Stomach/Bowel: Stomach, small bowel, appendix and colon are unremarkable. Vascular/Lymphatic: Atherosclerotic calcification of aorta. Generally similar abdominal and pelvic retroperitoneal lymph nodes with index heterogeneous 11 mm left periaortic lymph node (2/33), stable. Aortocaval lymph node has decreased minimally in size in the interval, now measuring 10 mm (2/35), previously 12 mm. Right external iliac lymph node has also decreased minimally in size, now 9 mm (2/58), previously 14 mm. 10 mm lymph node in the sigmoid mesocolon (2/65), stable. Reproductive: Hysterectomy. Left adnexal heterogeneous cystic and solid mass is stable in size, 3.0 x 3.4 cm (2/54). Other: No free fluid.  Small bilateral inguinal hernias contain fat. Musculoskeletal: Degenerative changes in the spine. No worrisome lytic or sclerotic  lesions. Dextroconvex scoliosis. IMPRESSION: 1. Slight response to therapy as evidenced by decrease in size of aortocaval and right external iliac lymph nodes. Left adnexal metastasis, adjacent lymph node in the sigmoid mesocolon and other abdominopelvic retroperitoneal lymph nodes are stable. 2.  Aortic atherosclerosis (ICD10-I70.0). Electronically Signed   By: Newell Eke M.D.   On: 12/07/2022 09:37   ECHOCARDIOGRAM COMPLETE  Result Date: 12/05/2022    ECHOCARDIOGRAM REPORT   Patient Name:   ANALAYAH BROOKE [facility] Date of Exam: 12/05/2022 Medical Rec #:  [id number]      Height:       59.5 in Accession #:    [id number]     Weight:       142.2 lb Date of Birth:  May 20, 1945      BSA:           1.605 m Patient Age:    78 years       BP:           153/72 mmHg Patient Gender: F              HR:           67 bpm. Exam Location:  Outpatient Procedure: 2D Echo, 3D Echo, Color Doppler, Cardiac Doppler and Strain Analysis Indications:    Z51.11 Encounter for antineoplastic chemotheraphy  History:        Patient has prior history of Echocardiogram examinations, most                 recent 07/27/2022. Signs/Symptoms:Bacteremia; Risk                 Factors:Dyslipidemia. Breast cancer.  Sonographer:    Ellouise Mose RDCS Referring Phys: [id number] Mal Asher IMPRESSIONS  1. Left ventricular ejection fraction, by estimation, is 60 to 65%. Left ventricular ejection fraction by 3D volume is 64 %. The left ventricle has normal function. The left ventricle has no regional wall motion abnormalities. Left ventricular diastolic  parameters were normal. The average left ventricular global longitudinal strain is -22.6 %. The global longitudinal strain is normal.  2. Right ventricular systolic function is normal. The right ventricular size is normal. Tricuspid regurgitation signal is inadequate for assessing PA pressure.  3. The mitral valve is normal in structure. No evidence of mitral valve regurgitation. No evidence of mitral stenosis.  4. The aortic valve is tricuspid. Aortic valve regurgitation is not visualized. No aortic stenosis is present.  5. The inferior vena cava is normal in size with greater than 50% respiratory variability, suggesting right atrial pressure of 3 mmHg. FINDINGS  Left Ventricle: Left ventricular ejection fraction, by estimation, is 60 to 65%. Left ventricular ejection fraction by 3D volume is 64 %. The left ventricle has normal function. The left ventricle has no regional wall motion abnormalities. The average left ventricular global longitudinal strain is -22.6 %. The global longitudinal strain is normal. The left ventricular internal cavity size was normal in size. There is no left ventricular  hypertrophy. Left ventricular diastolic parameters were normal. Right Ventricle: The right ventricular size is normal. No increase in right ventricular wall thickness. Right ventricular systolic function is normal. Tricuspid regurgitation signal is inadequate for assessing PA pressure. Left Atrium: Left atrial size was normal in size. Right Atrium: Right atrial size was normal in size. Pericardium: There is no evidence of pericardial effusion. Mitral Valve: The mitral valve is normal in structure. No evidence of mitral valve regurgitation. No evidence of mitral valve stenosis. Tricuspid Valve: The tricuspid  valve is normal in structure. Tricuspid valve regurgitation is trivial. Aortic Valve: The aortic valve is tricuspid. Aortic valve regurgitation is not visualized. No aortic stenosis is present. Pulmonic Valve: The pulmonic valve was not well visualized. Pulmonic valve regurgitation is not visualized. Aorta: The aortic root and ascending aorta are structurally normal, with no evidence of dilitation. Venous: The inferior vena cava is normal in size with greater than 50% respiratory variability, suggesting right atrial pressure of 3 mmHg. IAS/Shunts: The interatrial septum was not well visualized.  LEFT VENTRICLE PLAX 2D LVIDd:         4.20 cm         Diastology LVIDs:         2.80 cm         LV e' medial:    7.51 cm/s LV PW:         0.90 cm         LV E/e' medial:  12.7 LV IVS:        0.80 cm         LV e' lateral:   9.68 cm/s LVOT diam:     2.30 cm         LV E/e' lateral: 9.8 LV SV:         88 LV SV Index:   55              2D LVOT Area:     4.15 cm        Longitudinal                                Strain                                2D Strain GLS  -22.6 % LV Volumes (MOD)               Avg: LV vol d, MOD    71.9 ml A2C:                           3D Volume EF LV vol d, MOD    76.0 ml       LV 3D EF:    Left A4C:                                        ventricul LV vol s, MOD    25.3 ml                    ar  A2C:                                        ejection LV vol s, MOD    29.1 ml                    fraction A4C:                                        by 3D LV SV MOD A2C:   46.6 ml  volume is LV SV MOD A4C:   76.0 ml                    64 %. LV SV MOD BP:    51.5 ml                                 3D Volume EF:                                3D EF:        64 %                                LV EDV:       124 ml                                LV ESV:       45 ml                                LV SV:        79 ml RIGHT VENTRICLE             IVC RV S prime:     12.20 cm/s  IVC diam: 1.70 cm TAPSE (M-mode): 2.3 cm LEFT ATRIUM             Index        RIGHT ATRIUM           Index LA diam:        2.70 cm 1.68 cm/m   RA Area:     11.10 cm LA Vol (A2C):   54.4 ml 33.90 ml/m  RA Volume:   22.90 ml  14.27 ml/m LA Vol (A4C):   19.3 ml 12.03 ml/m LA Biplane Vol: 35.8 ml 22.31 ml/m  AORTIC VALVE LVOT Vmax:   108.00 cm/s LVOT Vmean:  67.800 cm/s LVOT VTI:    0.212 m  AORTA Ao Root diam: 3.10 cm Ao Asc diam:  3.10 cm MITRAL VALVE MV Area (PHT): 3.88 cm     SHUNTS MV Decel Time: 196 msec     Systemic VTI:  0.21 m MV E velocity: 95.15 cm/s   Systemic Diam: 2.30 cm MV A velocity: 108.00 cm/s MV E/A ratio:  0.88 Lonni Nanas MD Electronically signed by Lonni Nanas MD Signature Date/Time: 12/05/2022/6:06:56 PM    Final         PHYSICAL EXAMINATION: ECOG PERFORMANCE STATUS: 0 - Asymptomatic  Vitals:   01/17/23 1019  BP: (!) 157/88  Pulse: 81  Resp: 18  Temp: 98.1 F (36.7 C)  SpO2: 99%   Filed Weights   01/17/23 1019  Weight: 138 lb 12.8 oz (63 kg)    GENERAL:alert, no distress and comfortable  NEURO: alert & oriented x 3 with fluent speech, no focal motor/sensory deficits  LABORATORY DATA:  I have reviewed the data as listed    Component Value Date/Time   NA 139 12/27/2022 1033   NA 140 11/02/2016 1130   K 3.6 12/27/2022 1033   K 3.8 11/02/2016 1130   CL 106  12/27/2022 1033   CL 105 05/02/2012 9177  CO2 26 12/27/2022 1033   CO2 26 11/02/2016 1130   GLUCOSE 103 (H) 12/27/2022 1033   GLUCOSE 87 11/02/2016 1130   GLUCOSE 98 05/02/2012 0822   BUN 19 12/27/2022 1033   BUN 14.7 11/02/2016 1130   CREATININE 1.04 (H) 12/27/2022 1033   CREATININE 0.84 12/05/2018 1014   CREATININE 0.8 11/02/2016 1130   CALCIUM  9.0 12/27/2022 1033   CALCIUM  9.7 11/02/2016 1130   PROT 6.3 (L) 12/27/2022 1033   PROT 6.8 11/02/2016 1130   ALBUMIN 3.6 12/27/2022 1033   ALBUMIN 3.6 11/02/2016 1130   AST 25 12/27/2022 1033   AST 23 11/02/2016 1130   ALT 15 12/27/2022 1033   ALT 17 11/02/2016 1130   ALKPHOS 93 12/27/2022 1033   ALKPHOS 59 11/02/2016 1130   BILITOT 0.4 12/27/2022 1033   BILITOT 0.34 11/02/2016 1130   GFRNONAA 56 (L) 12/27/2022 1033   GFRAA >60 11/08/2017 0504   GFRAA >60 07/25/2017 1425    No results found for: SPEP, UPEP  Lab Results  Component Value Date   WBC 3.7 (L) 01/17/2023   NEUTROABS 2.0 01/17/2023   HGB 11.4 (L) 01/17/2023   HCT 34.2 (L) 01/17/2023   MCV 98.8 01/17/2023   PLT 229 01/17/2023      Chemistry      Component Value Date/Time   NA 139 12/27/2022 1033   NA 140 11/02/2016 1130   K 3.6 12/27/2022 1033   K 3.8 11/02/2016 1130   CL 106 12/27/2022 1033   CL 105 05/02/2012 0822   CO2 26 12/27/2022 1033   CO2 26 11/02/2016 1130   BUN 19 12/27/2022 1033   BUN 14.7 11/02/2016 1130   CREATININE 1.04 (H) 12/27/2022 1033   CREATININE 0.84 12/05/2018 1014   CREATININE 0.8 11/02/2016 1130      Component Value Date/Time   CALCIUM  9.0 12/27/2022 1033   CALCIUM  9.7 11/02/2016 1130   ALKPHOS 93 12/27/2022 1033   ALKPHOS 59 11/02/2016 1130   AST 25 12/27/2022 1033   AST 23 11/02/2016 1130   ALT 15 12/27/2022 1033   ALT 17 11/02/2016 1130   BILITOT 0.4 12/27/2022 1033   BILITOT 0.34 11/02/2016 1130

## 2023-01-17 NOTE — Patient Instructions (Signed)

## 2023-01-18 ENCOUNTER — Other Ambulatory Visit: Payer: Self-pay

## 2023-01-18 ENCOUNTER — Encounter: Payer: Self-pay | Admitting: Hematology and Oncology

## 2023-01-18 LAB — CA 125: Cancer Antigen (CA) 125: 8.4 U/mL (ref 0.0–38.1)

## 2023-01-18 NOTE — Telephone Encounter (Signed)
 Called and schdeuled

## 2023-02-06 MED FILL — Fosaprepitant Dimeglumine For IV Infusion 150 MG (Base Eq): INTRAVENOUS | Qty: 5 | Status: AC

## 2023-02-07 ENCOUNTER — Encounter: Payer: Self-pay | Admitting: Hematology and Oncology

## 2023-02-07 ENCOUNTER — Inpatient Hospital Stay: Payer: Medicare HMO

## 2023-02-07 ENCOUNTER — Inpatient Hospital Stay: Payer: Medicare HMO | Admitting: Hematology and Oncology

## 2023-02-07 VITALS — BP 136/66 | HR 73 | Temp 98.2°F | Resp 17

## 2023-02-07 VITALS — BP 151/85 | HR 80 | Temp 98.4°F | Resp 18 | Ht 59.5 in | Wt 138.0 lb

## 2023-02-07 DIAGNOSIS — C778 Secondary and unspecified malignant neoplasm of lymph nodes of multiple regions: Secondary | ICD-10-CM | POA: Diagnosis not present

## 2023-02-07 DIAGNOSIS — C541 Malignant neoplasm of endometrium: Secondary | ICD-10-CM | POA: Diagnosis not present

## 2023-02-07 DIAGNOSIS — D61818 Other pancytopenia: Secondary | ICD-10-CM | POA: Diagnosis not present

## 2023-02-07 DIAGNOSIS — N183 Chronic kidney disease, stage 3 unspecified: Secondary | ICD-10-CM

## 2023-02-07 DIAGNOSIS — C549 Malignant neoplasm of corpus uteri, unspecified: Secondary | ICD-10-CM

## 2023-02-07 DIAGNOSIS — D631 Anemia in chronic kidney disease: Secondary | ICD-10-CM

## 2023-02-07 DIAGNOSIS — Z5112 Encounter for antineoplastic immunotherapy: Secondary | ICD-10-CM | POA: Diagnosis not present

## 2023-02-07 DIAGNOSIS — R197 Diarrhea, unspecified: Secondary | ICD-10-CM | POA: Diagnosis not present

## 2023-02-07 LAB — CBC WITH DIFFERENTIAL (CANCER CENTER ONLY)
Abs Immature Granulocytes: 0.01 10*3/uL (ref 0.00–0.07)
Basophils Absolute: 0 10*3/uL (ref 0.0–0.1)
Basophils Relative: 1 %
Eosinophils Absolute: 0.1 10*3/uL (ref 0.0–0.5)
Eosinophils Relative: 3 %
HCT: 34.5 % — ABNORMAL LOW (ref 36.0–46.0)
Hemoglobin: 11.4 g/dL — ABNORMAL LOW (ref 12.0–15.0)
Immature Granulocytes: 0 %
Lymphocytes Relative: 24 %
Lymphs Abs: 1 10*3/uL (ref 0.7–4.0)
MCH: 33.3 pg (ref 26.0–34.0)
MCHC: 33 g/dL (ref 30.0–36.0)
MCV: 100.9 fL — ABNORMAL HIGH (ref 80.0–100.0)
Monocytes Absolute: 0.6 10*3/uL (ref 0.1–1.0)
Monocytes Relative: 14 %
Neutro Abs: 2.3 10*3/uL (ref 1.7–7.7)
Neutrophils Relative %: 58 %
Platelet Count: 209 10*3/uL (ref 150–400)
RBC: 3.42 MIL/uL — ABNORMAL LOW (ref 3.87–5.11)
RDW: 17.4 % — ABNORMAL HIGH (ref 11.5–15.5)
WBC Count: 4 10*3/uL (ref 4.0–10.5)
nRBC: 0 % (ref 0.0–0.2)

## 2023-02-07 LAB — CMP (CANCER CENTER ONLY)
ALT: 18 U/L (ref 0–44)
AST: 28 U/L (ref 15–41)
Albumin: 3.6 g/dL (ref 3.5–5.0)
Alkaline Phosphatase: 104 U/L (ref 38–126)
Anion gap: 7 (ref 5–15)
BUN: 18 mg/dL (ref 8–23)
CO2: 25 mmol/L (ref 22–32)
Calcium: 9.1 mg/dL (ref 8.9–10.3)
Chloride: 106 mmol/L (ref 98–111)
Creatinine: 0.98 mg/dL (ref 0.44–1.00)
GFR, Estimated: 60 mL/min — ABNORMAL LOW (ref >60)
Glucose, Bld: 95 mg/dL (ref 70–99)
Potassium: 3.6 mmol/L (ref 3.5–5.1)
Sodium: 138 mmol/L (ref 135–145)
Total Bilirubin: 0.4 mg/dL (ref 0.0–1.2)
Total Protein: 6.7 g/dL (ref 6.5–8.1)

## 2023-02-07 MED ORDER — HEPARIN SOD (PORK) LOCK FLUSH 100 UNIT/ML IV SOLN
500.0000 [IU] | Freq: Once | INTRAVENOUS | Status: AC | PRN
  Administered 2023-02-07: 500 [IU]

## 2023-02-07 MED ORDER — PALONOSETRON HCL INJECTION 0.25 MG/5ML
0.2500 mg | Freq: Once | INTRAVENOUS | Status: AC
  Administered 2023-02-07: 0.25 mg via INTRAVENOUS
  Filled 2023-02-07: qty 5

## 2023-02-07 MED ORDER — FAM-TRASTUZUMAB DERUXTECAN-NXKI CHEMO 100 MG IV SOLR
5.4000 mg/kg | Freq: Once | INTRAVENOUS | Status: AC
  Administered 2023-02-07: 340 mg via INTRAVENOUS
  Filled 2023-02-07: qty 17

## 2023-02-07 MED ORDER — SODIUM CHLORIDE 0.9% FLUSH
10.0000 mL | INTRAVENOUS | Status: DC | PRN
  Administered 2023-02-07: 10 mL

## 2023-02-07 MED ORDER — SODIUM CHLORIDE 0.9 % IV SOLN
150.0000 mg | Freq: Once | INTRAVENOUS | Status: AC
  Administered 2023-02-07: 150 mg via INTRAVENOUS
  Filled 2023-02-07: qty 150

## 2023-02-07 MED ORDER — DEXTROSE 5 % IV SOLN: Freq: Once | INTRAVENOUS | Status: AC

## 2023-02-07 MED ORDER — SODIUM CHLORIDE 0.9% FLUSH
10.0000 mL | Freq: Once | INTRAVENOUS | Status: AC
  Administered 2023-02-07: 10 mL

## 2023-02-07 MED ORDER — DEXAMETHASONE SODIUM PHOSPHATE 10 MG/ML IJ SOLN
10.0000 mg | Freq: Once | INTRAMUSCULAR | Status: AC
Start: 2023-02-07 — End: 2023-02-07
  Administered 2023-02-07: 10 mg via INTRAVENOUS
  Filled 2023-02-07: qty 1

## 2023-02-07 NOTE — Patient Instructions (Signed)
[facility] - A DEPT OF [facility]  Discharge Instructions: Thank you for choosing [facility] to provide your oncology and hematology care.   If you have a lab appointment with the Cancer Center, please go directly to the Cancer Center and check in at the registration area.   Wear comfortable clothing and clothing appropriate for easy access to any Portacath or PICC line.   We strive to give you quality time with your provider. You may need to reschedule your appointment if you arrive late (15 or more minutes).  Arriving late affects you and other patients whose appointments are after yours.  Also, if you miss three or more appointments without notifying the office, you may be dismissed from the clinic at the provider's discretion.      For prescription refill requests, have your pharmacy contact our office and allow 72 hours for refills to be completed.    Today you received the following chemotherapy and/or immunotherapy agents: Enhertu      To help prevent nausea and vomiting after your treatment, we encourage you to take your nausea medication as directed.  BELOW ARE SYMPTOMS THAT SHOULD BE REPORTED IMMEDIATELY: *FEVER GREATER THAN 100.4 F (38 C) OR HIGHER *CHILLS OR SWEATING *NAUSEA AND VOMITING THAT IS NOT CONTROLLED WITH YOUR NAUSEA MEDICATION *UNUSUAL SHORTNESS OF BREATH *UNUSUAL BRUISING OR BLEEDING *URINARY PROBLEMS (pain or burning when urinating, or frequent urination) *BOWEL PROBLEMS (unusual diarrhea, constipation, pain near the anus) TENDERNESS IN MOUTH AND THROAT WITH OR WITHOUT PRESENCE OF ULCERS (sore throat, sores in mouth, or a toothache) UNUSUAL RASH, SWELLING OR PAIN  UNUSUAL VAGINAL DISCHARGE OR ITCHING   Items with * indicate a potential emergency and should be followed up as soon as possible or go to the Emergency Department if any problems should occur.  Please show the CHEMOTHERAPY ALERT CARD or IMMUNOTHERAPY  ALERT CARD at check-in to the Emergency Department and triage nurse.  Should you have questions after your visit or need to cancel or reschedule your appointment, please contact [facility] - A DEPT OF [facility]  Dept: [phone]  and follow the prompts.  Office hours are 8:00 a.m. to 4:30 p.m. Monday - Friday. Please note that voicemails left after 4:00 p.m. may not be returned until the following business day.  We are closed weekends and major holidays. You have access to a nurse at all times for urgent questions. Please call the main number to the clinic Dept: [phone] and follow the prompts.   For any non-urgent questions, you may also contact your provider using MyChart. We now offer e-Visits for anyone 57 and older to request care online for non-urgent symptoms. For details visit [email or web address].   Also download the MyChart app! Go to the app store, search "MyChart", open the app, select [facility], and log in with your MyChart username and password.

## 2023-02-07 NOTE — Assessment & Plan Note (Signed)
She has history of intermittent kidney failure We will monitor that closely

## 2023-02-07 NOTE — Assessment & Plan Note (Signed)
Suspect this is due to magnesium supplement I told the patient to discontinue magnesium supplement

## 2023-02-07 NOTE — Progress Notes (Signed)
[facility] OFFICE PROGRESS NOTE  Patient Care Team: Carver Fila, MD as PCP - General (Gynecologic Oncology) Hannah Beat, MD as Consulting Physician [facility] Medicine)  ASSESSMENT & PLAN:  Carcinosarcoma of body of uterus (HCC) Overall, she tolerated treatment well without major side effects She has intermittent diarrhea which I suspect could be due to her magnesium supplement which I told her to discontinue I plan to repeat imaging study next month If she has positive response to therapy, I will follow-up with echocardiogram monitoring  Anemia in chronic kidney disease Her blood count is stable She is not symptomatic Observe only  CKD (chronic kidney disease), stage III (HCC) She has history of intermittent kidney failure We will monitor that closely  Diarrhea Suspect this is due to magnesium supplement I told the patient to discontinue magnesium supplement  Orders Placed This Encounter  Procedures   CT CHEST ABDOMEN PELVIS W CONTRAST    Standing Status:   Future    Expected Date:   02/21/2023    Expiration Date:   02/07/2024    Scheduling Instructions:     No need oral contrast    If indicated for the ordered procedure, I authorize the administration of contrast media per Radiology protocol:   Yes    Does the patient have a contrast media/X-ray dye allergy?:   No    Preferred imaging location?:   [facility]    If indicated for the ordered procedure, I authorize the administration of oral contrast media per Radiology protocol:   Yes    All questions were answered. The patient knows to call the clinic with any problems, questions or concerns. The total time spent in the appointment was 30 minutes encounter with patients including review of chart and various tests results, discussions about plan of care and coordination of care plan   Artis Delay, MD 02/07/2023 10:31 AM  INTERVAL HISTORY: Please see below for problem oriented  charting. she returns for chemo follow-up She tolerated last cycle well except for fatigue and mild intermittent diarrhea She denies abdominal pain or nausea We discussed timing of next imaging study and future follow-up  REVIEW OF SYSTEMS:   Constitutional: Denies fevers, chills or abnormal weight loss Eyes: Denies blurriness of vision Ears, nose, mouth, throat, and face: Denies mucositis or sore throat Respiratory: Denies cough, dyspnea or wheezes Cardiovascular: Denies palpitation, chest discomfort or lower extremity swelling Skin: Denies abnormal skin rashes Lymphatics: Denies new lymphadenopathy or easy bruising Neurological:Denies numbness, tingling or new weaknesses Behavioral/Psych: Mood is stable, no new changes  All other systems were reviewed with the patient and are negative.  I have reviewed the past medical history, past surgical history, social history and family history with the patient and they are unchanged from previous note.  ALLERGIES:  is allergic to tylenol [acetaminophen] and latex.  MEDICATIONS:  Current Outpatient Medications  Medication Sig Dispense Refill   Cyanocobalamin (VITAMIN B 12 PO) Take 1 tablet by mouth.     escitalopram (LEXAPRO) 10 MG tablet TAKE 1 TABLET EVERY DAY 90 tablet 0   escitalopram (LEXAPRO) 20 MG tablet Take 1 tablet (20 mg total) by mouth daily. 90 tablet 0   ondansetron (ZOFRAN) 8 MG tablet Take 1 tablet (8 mg total) by mouth every 8 (eight) hours as needed for nausea or vomiting. Start on the third day after chemotherapy. 30 tablet 1   pantoprazole (PROTONIX) 40 MG tablet Take 1 tablet (40 mg total) by mouth daily. 90 tablet  1   prochlorperazine (COMPAZINE) 10 MG tablet Take 1 tablet (10 mg total) by mouth every 6 (six) hours as needed for nausea or vomiting. 30 tablet 1   traMADol (ULTRAM) 50 MG tablet Take 1 tablet (50 mg total) by mouth every 6 (six) hours as needed. 60 tablet 0   VITAMIN D, CHOLECALCIFEROL, PO Take 1 tablet by  mouth daily.     No current facility-administered medications for this visit.   Facility-Administered Medications Ordered in Other Visits  Medication Dose Route Frequency Provider Last Rate Last Admin   fam-trastuzumab deruxtecan-nxki (ENHERTU) 340 mg in dextrose 5 % 100 mL chemo infusion  5.4 mg/kg (Treatment Plan Recorded) Intravenous Once Bertis Ruddy, Sharlee Rufino, MD       fosaprepitant (EMEND) 150 mg in sodium chloride 0.9 % 145 mL IVPB  150 mg Intravenous Once Bertis Ruddy, Bonnye Halle, MD 450 mL/hr at 02/07/23 1019 150 mg at 02/07/23 1019   heparin lock flush 100 unit/mL  500 Units Intracatheter Once PRN Bertis Ruddy, Shedric Fredericks, MD       sodium chloride flush (NS) 0.9 % injection 10 mL  10 mL Intracatheter PRN Bertis Ruddy, Ruel Dimmick, MD        SUMMARY OF ONCOLOGIC HISTORY: Oncology History Overview Note  MMR normal MSI stable PD-L1 CPS: 1% Her2 positive P53 + Carcinosarcoma Neg genetics ER negative   Carcinosarcoma of body of uterus (HCC)  09/23/2021 Initial Diagnosis   She presented with PMB   09/28/2021 Imaging   US pelvis Enlarged uterus with fibroids. Pelvic sonogram is otherwise unremarkable.    11/11/2021 Pathology Results   A. CERVIX, MASS, EXCISION:  - HIGH-GRADE UNDIFFERENTIATED MALIGNANCY WITH EXTENSIVE NECROSIS (SEE COMMENT).   COMMENT:  The tumor consists of mostly high-grade sarcomatoid morphology with focal epithelioid morphology.  Immunohistochemical stains are performed on block A4.  The tumor cells are positive for desmin and p16 while negative for CD45, CK7, CK20, chromogranin, synaptophysin, Melan-A, p40, AE1/AE3, SMA, p63, cyclin D1, S100 and smooth muscle myosin.  Ki-67 proliferation index is high.  CD10 staining shows focal nonspecific staining.  The differential diagnosis includes a high-grade sarcoma and a partially sampled carcinosarcoma.    11/25/2021 Imaging   MRI pelvis Marked distention of the entire endometrial cavity by heterogeneously enhancing soft tissue density, which extends through the  endocervical canal into the lower vagina. This is highly suspicious for endometrial carcinoma.   Diffuse myometrial thinning due to marked distention of endometrial cavity limits evaluation; deep myometrial invasion cannot be excluded in the uterine fundus.   No evidence of extra-uterine extension of tumor.   Lymphadenopathy in lower abdominal retroperitoneum, bilateral iliac chains, and sigmoid mesocolon, consistent with metastatic disease.   Sigmoid diverticulosis. No radiographic evidence of diverticulitis.   11/26/2021 Initial Diagnosis   Uterine cancer (HCC)   11/26/2021 Cancer Staging   Staging form: Corpus Uteri - Carcinoma and Carcinosarcoma, AJCC 8th Edition - Clinical stage from 11/26/2021: FIGO Stage IIIC2 (cT3, cN2, cM0) - Signed by Artis Delay, MD on 11/26/2021 Stage prefix: Initial diagnosis Histologic grade (G): G3 Histologic grading system: 3 grade system   11/30/2021 Pathology Results   FINAL MICROSCOPIC DIAGNOSIS:  A. UTERINE CONTENTS, BIOPSY: - HIGH-GRADE UNDIFFERENTIATED MALIGNANCY WITH EXTENSIVE NECROSIS (SEE COMMENT).  COMMENT: The patient's history of a high-grade undifferentiated malignancy with extensive necrosis of the cervix is noted.  The morphological features of the current tumor cells are similar to the tumor cells seen in the previous cervical mass excision.  Immunohistochemical staining for desmin and cytokeratin AE1/AE3 is performed on  block A1.  The tumor cells are diffusely positive for desmin.   AE1/AE3 stain is focally positive, however this is scant and of unclear clinical significance. Given the small quantity of tissue in the current biopsy, a definitive distinction between a high-grade sarcoma and carcinosarcoma cannot be made as this biopsy may not be representative of the entire tumor. If clinically indicated, this case as well as the previous cervical mass excision ([id number]) may be sent out for expert consultation. Clinical correlation  recommended.     12/01/2021 Tumor Marker   Patient's tumor was tested for the following markers: CA-125. Results of the tumor marker test revealed 491.   12/22/2021 Surgery   Robotic-assisted laparoscopic total hysterectomy with bilateral salpingo-oophorectomy, tumor debulking including high common iliac lymph node excision, aborted attempt of bilateral external iliac enlarged lymph nodes, mini-lap for specimen delivery, cystoscopy    12/22/2021 Pathology Results   FINAL MICROSCOPIC DIAGNOSIS:  A. LEFT CERVICAL MARGIN, EXCISION: - Positive for carcinoma  B. HIGH LEFT COMMON ILIAC LYMPH NODE, EXCISION: - Metastatic carcinosarcoma involving the lymph node  C. 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, including undifferentiated sarcoma with rhabdoid features and high-grade serous carcinoma, see comment - Tumor invades for a depth of 1 mm where myometrial thickness is 5 mm - Benign bilateral fallopian tubes and ovaries - See oncology table - See comment  ONCOLOGY TABLE:  UTERUS, CARCINOMA OR CARCINOSARCOMA: Resection  Procedure: Total hysterectomy and bilateral salpingo-oophorectomy Histologic Type: Carcinosarcoma (mixed malignant Mullerian tumor) Histologic Grade: High-grade Myometrial Invasion:      Depth of Myometrial Invasion (mm): 1 mm      Myometrial Thickness (mm): 5 mm      Percentage of Myometrial Invasion: 20% Uterine Serosa Involvement: Not identified Cervical stromal Involvement: Present Extent of involvement of other tissue/organs: Not identified Peritoneal/Ascitic Fluid: Not applicable Lymphovascular Invasion: Present Regional Lymph Nodes:      Pelvic Lymph Nodes Examined:                                  0 Sentinel                                  1 Non-sentinel                                  1 Total      Pelvic Lymph Nodes with Metastasis: 1                           Macrometastasis: (>2.0 mm): 1                          Micrometastasis: (>0.2 mm and < 2.0 mm): 0                          Isolated Tumor Cells (<0.2 mm): 0                          Laterality of Lymph Node with Tumor: Left  Extracapsular Extension: Not identified      Para-aortic Lymph Nodes Examined:                                   0 Sentinel                                   0 non-sentinel                                   0 total Distant Metastasis:      Distant Site(s) Involved: Not applicable Pathologic Stage Classification (pTNM, AJCC 8th Edition): pT1a, pN1 Ancillary Studies: MMR / MSI testing will be ordered Representative Tumor Block: C1 Comment(s): None  ([id number])       01/13/2022 Procedure   Placement of single lumen port a cath via right internal jugular vein. The catheter tip lies at the cavo-atrial junction. A power injectable port a cath was placed and is ready for immediate use.     01/19/2022 Echocardiogram    1. Left ventricular ejection fraction, by estimation, is 60 to 65%. The left ventricle has normal function. The left ventricle has no regional wall motion abnormalities. Left ventricular diastolic parameters are consistent with Grade I diastolic dysfunction (impaired relaxation). The average left ventricular global longitudinal strain is -24.0 %. The global longitudinal strain is normal.  2. Right ventricular systolic function is normal. The right ventricular size is normal. There is normal pulmonary artery systolic pressure.  3. Left atrial size was mildly dilated.  4. The mitral valve is normal in structure. No evidence of mitral valve regurgitation. No evidence of mitral stenosis.  5. The aortic valve was not well visualized. Aortic valve regurgitation is not visualized. Aortic valve sclerosis is present, with no evidence of aortic valve stenosis.  6. The inferior vena cava is normal in size with greater than 50% respiratory  variability, suggesting right atrial pressure of 3 mmHg.     01/27/2022 Tumor Marker   Patient's tumor was tested for the following markers: CA-125. Results of the tumor marker test revealed 307.   02/14/2022 - 08/22/2022 Chemotherapy   Patient is on Treatment Plan : UTERINE SEROUS CARCINOMA Carboplatin + Paclitaxel + Trastuzumab q21d x 6 Cycles / Trastuzumab q21d     02/15/2022 Tumor Marker   Patient's tumor was tested for the following markers: CA-125. Results of the tumor marker test revealed 161.   03/29/2022 Tumor Marker   Patient's tumor was tested for the following markers: CA-125. Results of the tumor marker test revealed 30.   04/13/2022 Echocardiogram   1. Left ventricular ejection fraction, by estimation, is 60 to 65%. The left ventricle has normal function. The left ventricle has no regional wall motion abnormalities. Left ventricular diastolic parameters are consistent with Grade I diastolic dysfunction (impaired relaxation). The average left ventricular global longitudinal strain is -19.4 %. The global longitudinal strain is normal.  2. Right ventricular systolic function is normal. The right ventricular size is normal. There is normal pulmonary artery systolic pressure. The estimated right ventricular systolic pressure is 21.0 mmHg.  3. Left atrial size was mild to moderately dilated.  4. The mitral valve is grossly normal. Trivial mitral valve regurgitation.  5. The aortic valve is tricuspid. Aortic valve regurgitation is not visualized. Aortic valve  sclerosis is present, with no evidence of aortic valve stenosis.  6. The inferior vena cava is normal in size with greater than 50% respiratory variability, suggesting right atrial pressure of 3 mmHg.   06/01/2022 Tumor Marker   Patient's tumor was tested for the following markers: CA-125. Results of the tumor marker test revealed 8.   06/05/2022 Genetic Testing   Negative genetic testing on the Multi-Cancer gene panel + RNA.  The  report date is Jun 05, 2022.  The Multi-Cancer + RNA Panel offered by [facility] includes sequencing and/or deletion/duplication analysis of the following 70 genes:  AIP*, ALK, APC*, ATM*, AXIN2*, BAP1*, BARD1*, BLM*, BMPR1A*, BRCA1*, BRCA2*, BRIP1*, CDC73*, CDH1*, CDK4, CDKN1B*, CDKN2A, CHEK2*, CTNNA1*, DICER1*, EPCAM (del/dup only), EGFR, FH*, FLCN*, GREM1 (promoter dup only), HOXB13, KIT, LZTR1, MAX*, MBD4, MEN1*, MET, MITF, MLH1*, MSH2*, MSH3*, MSH6*, MUTYH*, NF1*, NF2*, NTHL1*, PALB2*, PDGFRA, PMS2*, POLD1*, POLE*, POT1*, PRKAR1A*, PTCH1*, PTEN*, RAD51C*, RAD51D*, RB1*, RET, SDHA* (sequencing only), SDHAF2*, SDHB*, SDHC*, SDHD*, SMAD4*, SMARCA4*, SMARCB1*, SMARCE1*, STK11*, SUFU*, TMEM127*, TP53*, TSC1*, TSC2*, VHL*. RNA analysis is performed for * genes.    06/20/2022 Imaging   1. Enlarged periaortic and iliac lymph nodes consistent metastatic disease. Bulky iliac node on the LEFT. 2. No evidence of peritoneal metastasis. 3. Post hysterectomy and oophorectomy.   07/13/2022 Tumor Marker   Patient's tumor was tested for the following markers: CA-125. Results of the tumor marker test revealed 8.8.   07/27/2022 Echocardiogram   ECHOCARDIOGRAM COMPLETE  Result Date: 07/27/2022    ECHOCARDIOGRAM REPORT   Patient Name:   Kayla Mcgrath Date of Exam: 07/27/2022 Medical Rec #:  [id number]      Height:       59.5 in Accession #:    [id number]     Weight:       140.4 lb Date of Birth:  1945/03/31      BSA:          1.596 m Patient Age:    78 years       BP:           135/92 mmHg Patient Gender: F              HR:           69 bpm. Exam Location:  Outpatient Procedure: 2D Echo, Color Doppler, Cardiac Doppler and Strain Analysis Indications:    Chemo  History:        Patient has prior history of Echocardiogram examinations, most                 recent 04/13/2022. Breast Cancer, Uterine Cancer, CKD; Risk                 Factors:Dyslipidemia.  Sonographer:    Milbert Coulter Referring Phys: [id number] Teddi Badalamenti  Sonographer  Comments: Global longitudinal strain was attempted. IMPRESSIONS  1. Prior GLS was -19.4 on 04/13/22 suggest f/u echo in 3 months to make sure not seeing early myocardial issues . Left ventricular ejection fraction, by estimation, is 55 to 60%. The left ventricle has normal function. The left ventricle has no regional wall motion abnormalities. Left ventricular diastolic parameters were normal. The average left ventricular global longitudinal strain is -15.9 %. The global longitudinal strain is normal.  2. Right ventricular systolic function is normal. The right ventricular size is normal.  3. The mitral valve is normal in structure. No evidence of mitral valve regurgitation. No evidence of mitral stenosis.  4. The aortic valve was not  well visualized. There is mild calcification of the aortic valve. Aortic valve regurgitation is not visualized. Aortic valve sclerosis is present, with no evidence of aortic valve stenosis.  5. The inferior vena cava is normal in size with greater than 50% respiratory variability, suggesting right atrial pressure of 3 mmHg. FINDINGS  Left Ventricle: Prior GLS was -19.4 on 04/13/22 suggest f/u echo in 3 months to make sure not seeing early myocardial issues. Left ventricular ejection fraction, by estimation, is 55 to 60%. The left ventricle has normal function. The left ventricle has no regional wall motion abnormalities. The average left ventricular global longitudinal strain is -15.9 %. The global longitudinal strain is normal. The left ventricular internal cavity size was normal in size. There is no left ventricular hypertrophy. Left ventricular diastolic parameters were normal. Right Ventricle: The right ventricular size is normal. No increase in right ventricular wall thickness. Right ventricular systolic function is normal. Left Atrium: Left atrial size was normal in size. Right Atrium: Right atrial size was normal in size. Pericardium: There is no evidence of pericardial effusion.  Mitral Valve: The mitral valve is normal in structure. No evidence of mitral valve regurgitation. No evidence of mitral valve stenosis. Tricuspid Valve: The tricuspid valve is normal in structure. Tricuspid valve regurgitation is not demonstrated. No evidence of tricuspid stenosis. Aortic Valve: The aortic valve was not well visualized. There is mild calcification of the aortic valve. Aortic valve regurgitation is not visualized. Aortic valve sclerosis is present, with no evidence of aortic valve stenosis. Aortic valve mean gradient measures 4.0 mmHg. Aortic valve peak gradient measures 8.2 mmHg. Aortic valve area, by VTI measures 1.40 cm. Pulmonic Valve: The pulmonic valve was normal in structure. Pulmonic valve regurgitation is not visualized. No evidence of pulmonic stenosis. Aorta: The aortic root is normal in size and structure. Venous: The inferior vena cava is normal in size with greater than 50% respiratory variability, suggesting right atrial pressure of 3 mmHg. IAS/Shunts: No atrial level shunt detected by color flow Doppler.  LEFT VENTRICLE PLAX 2D LVIDd:         4.00 cm   Diastology LVIDs:         2.60 cm   LV e' medial:    5.77 cm/s LV PW:         0.90 cm   LV E/e' medial:  10.8 LV IVS:        0.80 cm   LV e' lateral:   13.50 cm/s LVOT diam:     1.70 cm   LV E/e' lateral: 4.6 LV SV:         41 LV SV Index:   26        2D Longitudinal Strain LVOT Area:     2.27 cm  2D Strain GLS Avg:     -15.9 %  RIGHT VENTRICLE RV Basal diam:  2.70 cm RV Mid diam:    2.10 cm RV S prime:     12.80 cm/s TAPSE (M-mode): 2.1 cm LEFT ATRIUM             Index        RIGHT ATRIUM          Index LA diam:        2.70 cm 1.69 cm/m   RA Area:     8.87 cm LA Vol (A2C):   35.4 ml 22.18 ml/m  RA Volume:   16.30 ml 10.21 ml/m LA Vol (A4C):   36.6 ml 22.93 ml/m LA  Biplane Vol: 36.1 ml 22.62 ml/m  AORTIC VALVE AV Area (Vmax):    1.38 cm AV Area (Vmean):   1.35 cm AV Area (VTI):     1.40 cm AV Vmax:           143.00 cm/s AV  Vmean:          93.400 cm/s AV VTI:            0.293 m AV Peak Grad:      8.2 mmHg AV Mean Grad:      4.0 mmHg LVOT Vmax:         87.00 cm/s LVOT Vmean:        55.700 cm/s LVOT VTI:          0.180 m LVOT/AV VTI ratio: 0.62  AORTA Ao Root diam: 2.80 cm Ao Asc diam:  3.00 cm MITRAL VALVE               TRICUSPID VALVE MV Area (PHT): 3.30 cm    TR Peak grad:   21.7 mmHg MV Decel Time: 230 msec    TR Vmax:        233.00 cm/s MV E velocity: 62.10 cm/s MV A velocity: 87.80 cm/s  SHUNTS MV E/A ratio:  0.71        Systemic VTI:  0.18 m                            Systemic Diam: 1.70 cm Charlton Haws MD Electronically signed by Charlton Haws MD Signature Date/Time: 07/27/2022/10:27:37 AM    Final       08/23/2022 Tumor Marker   Patient's tumor was tested for the following markers: CA-125. Results of the tumor marker test revealed 7.2.   09/11/2022 Imaging   CT CHEST ABDOMEN PELVIS W CONTRAST  Result Date: 09/12/2022 CLINICAL DATA:  evaluate treatment response of uterine cancer/carcinosarcoma of uterus/endometrium. * Tracking Code: BO * EXAM: CT CHEST, ABDOMEN, AND PELVIS WITH CONTRAST TECHNIQUE: Multidetector CT imaging of the chest, abdomen and pelvis was performed following the standard protocol during bolus administration of intravenous contrast. RADIATION DOSE REDUCTION: This exam was performed according to the departmental dose-optimization program which includes automated exposure control, adjustment of the mA and/or kV according to patient size and/or use of iterative reconstruction technique. CONTRAST:  80mL OMNIPAQUE IOHEXOL 300 MG/ML  SOLN COMPARISON:  06/13/2022 abdominopelvic CT. Most recent chest CT 12/01/2021 FINDINGS: CT CHEST FINDINGS Cardiovascular: right Port-A-Cath terminates at the high right atrium. Aortic atherosclerosis. Normal heart size, without pericardial effusion. Lad coronary artery calcification. No central pulmonary embolism, on this non-dedicated study. Mediastinum/Nodes: 1.4 cm right  thyroid nodule. Not clinically significant; no follow-up imaging recommended (ref: J Am Coll Radiol. 2015 Feb;12(2): 143-50). No supraclavicular adenopathy. No axillary adenopathy. Right axillary node dissection. No mediastinal or hilar adenopathy. Esophageal fluid level on 23/2. Lungs/Pleura: No pleural fluid. Posterior central left upper lobe 7 x 6 mm nodule on 42/6 is similar to on image 46 of the prior. Musculoskeletal: Right breast implant. No acute osseous abnormality. CT ABDOMEN PELVIS FINDINGS Hepatobiliary: There may be a tiny cyst in segment 4A anteriorly. Normal gallbladder, without biliary ductal dilatation. Pancreas: Normal, without mass or ductal dilatation. Spleen: Normal in size, without focal abnormality. Adrenals/Urinary Tract: Normal adrenal glands. Normal kidneys, without hydronephrosis. Normal urinary bladder. Stomach/Bowel: Apparent proximal gastric wall thickening including on 43/2 is most likely due to underdistention. Normal distal stomach. Extensive colonic diverticulosis. Normal terminal ileum.  Normal small bowel. Vascular/Lymphatic: Aortic atherosclerosis. Index left periaortic node measures 1.2 x 1.7 cm on 71/2 versus 1.3 x 1.7 cm on the prior. A preaortic/pre caval 12 mm node on 72/2 is enlarged from 7 mm on the prior exam (when remeasured). More cephalad left periaortic node measures 1.0 cm on 60/2 versus 1.1 cm on the prior. Index right common iliac node measures 9 mm on 82/2 versus 13 mm on the prior. Right external iliac centrally necrotic node measures 1.4 cm on 95/2 versus 1.2 cm on the prior. Dominant left external iliac nodal mass measures 3.1 x 3.4 cm on 92/2 versus 2.3 x 2.8 cm on the prior exam Reproductive: Hysterectomy.  No adnexal mass. Other: No significant free fluid. No evidence of omental or peritoneal disease. Musculoskeletal: Osteopenia. S shaped thoracolumbar spine curvature. IMPRESSION: CT CHEST IMPRESSION 1. 7 x 6 mm left upper lobe pulmonary nodule is similar  compared to 12/01/2021. Stability favors a benign etiology. Recommend attention on follow-up to exclude unlikely indolent primary neoplasm or isolated metastasis. 2. No thoracic adenopathy. 3. Coronary artery atherosclerosis. Aortic Atherosclerosis (ICD10-I70.0). 4. Esophageal air fluid level suggests dysmotility or gastroesophageal reflux. CT ABDOMEN AND PELVIS IMPRESSION 1. Mixed response, with overall progression of nodal metastasis. Although some nodes are smaller, the dominant left pelvic sidewall nodal mass has significantly enlarged. 2. No new sites of metastatic disease identified. Electronically Signed   By: Jeronimo Greaves M.D.   On: 09/12/2022 11:07   MM 3D SCREENING MAMMOGRAM UNILATERAL LEFT BREAST  Result Date: 09/05/2022 CLINICAL DATA:  Screening. EXAM: DIGITAL SCREENING UNILATERAL LEFT MAMMOGRAM WITH CAD AND TOMOSYNTHESIS TECHNIQUE: Left screening digital craniocaudal and mediolateral oblique mammograms were obtained. Left screening digital breast tomosynthesis was performed. The images were evaluated with computer-aided detection. COMPARISON:  Previous exam(s). ACR Breast Density Category c: The breasts are heterogeneously dense, which may obscure small masses. FINDINGS: The patient has had a right mastectomy. There are no findings suspicious for malignancy. IMPRESSION: No mammographic evidence of malignancy. A result letter of this screening mammogram will be mailed directly to the patient. RECOMMENDATION: Screening mammogram in one year.  (Code:[id number]) BI-RADS CATEGORY  1: Negative. Electronically Signed   By: Ted Mcalpine M.D.   On: 09/05/2022 11:09      10/04/2022 - 10/04/2022 Chemotherapy   Patient is on Treatment Plan : Uterine Carboplatin + Paclitaxel + Bevacizumab q21d     10/04/2022 -  Chemotherapy   Patient is on Treatment Plan : BREAST METASTATIC Fam-Trastuzumab Deruxtecan-nxki (Enhertu) (5.4) q21d     11/19/2022 Tumor Marker   Patient's tumor was tested for the following  markers: CA-125. Results of the tumor marker test revealed 8.5.   11/29/2022 Imaging   CT ABDOMEN PELVIS W CONTRAST  Result Date: 12/07/2022 CLINICAL DATA:  Uterine sarcoma, assess treatment response. * Tracking Code: BO * EXAM: CT ABDOMEN AND PELVIS WITH CONTRAST TECHNIQUE: Multidetector CT imaging of the abdomen and pelvis was performed using the standard protocol following bolus administration of intravenous contrast. RADIATION DOSE REDUCTION: This exam was performed according to the departmental dose-optimization program which includes automated exposure control, adjustment of the mA and/or kV according to patient size and/or use of iterative reconstruction technique. CONTRAST:  OMNIPAQUE IOHEXOL 300 MG/ML  SOLN COMPARISON:  09/08/2022. FINDINGS: Lower chest: Mild scattered pulmonary parenchymal scarring. No acute findings. Heart is enlarged. No pericardial or pleural effusion. Distal esophagus is grossly unremarkable. Hepatobiliary: Tiny low-attenuation lesion in the left hepatic lobe, too small to characterize. Liver and gallbladder are  otherwise unremarkable. No biliary ductal dilatation. Pancreas: Negative. Spleen: Tiny low-attenuation lesions in the spleen, likely cysts. Adrenals/Urinary Tract: Adrenal glands are unremarkable. Millimetric low-attenuation lesions in the kidneys, too small to characterize. No specific follow-up necessary. Kidneys are otherwise unremarkable. Ureters are decompressed. Bladder is grossly unremarkable. Stomach/Bowel: Stomach, small bowel, appendix and colon are unremarkable. Vascular/Lymphatic: Atherosclerotic calcification of aorta. Generally similar abdominal and pelvic retroperitoneal lymph nodes with index heterogeneous 11 mm left periaortic lymph node (2/33), stable. Aortocaval lymph node has decreased minimally in size in the interval, now measuring 10 mm (2/35), previously 12 mm. Right external iliac lymph node has also decreased minimally in size, now 9 mm  (2/58), previously 14 mm. 10 mm lymph node in the sigmoid mesocolon (2/65), stable. Reproductive: Hysterectomy. Left adnexal heterogeneous cystic and solid mass is stable in size, 3.0 x 3.4 cm (2/54). Other: No free fluid.  Small bilateral inguinal hernias contain fat. Musculoskeletal: Degenerative changes in the spine. No worrisome lytic or sclerotic lesions. Dextroconvex scoliosis. IMPRESSION: 1. Slight response to therapy as evidenced by decrease in size of aortocaval and right external iliac lymph nodes. Left adnexal metastasis, adjacent lymph node in the sigmoid mesocolon and other abdominopelvic retroperitoneal lymph nodes are stable. 2.  Aortic atherosclerosis (ICD10-I70.0). Electronically Signed   By: Leanna Battles M.D.   On: 12/07/2022 09:37   ECHOCARDIOGRAM COMPLETE  Result Date: 12/05/2022    ECHOCARDIOGRAM REPORT   Patient Name:   Kayla Mcgrath [facility] Date of Exam: 12/05/2022 Medical Rec #:  [id number]      Height:       59.5 in Accession #:    [id number]     Weight:       142.2 lb Date of Birth:  1945/03/05      BSA:          1.605 m Patient Age:    78 years       BP:           153/72 mmHg Patient Gender: F              HR:           67 bpm. Exam Location:  Outpatient Procedure: 2D Echo, 3D Echo, Color Doppler, Cardiac Doppler and Strain Analysis Indications:    Z51.11 Encounter for antineoplastic chemotheraphy  History:        Patient has prior history of Echocardiogram examinations, most                 recent 07/27/2022. Signs/Symptoms:Bacteremia; Risk                 Factors:Dyslipidemia. Breast cancer.  Sonographer:    Sheralyn Boatman RDCS Referring Phys: [id number] Rasheen Bells IMPRESSIONS  1. Left ventricular ejection fraction, by estimation, is 60 to 65%. Left ventricular ejection fraction by 3D volume is 64 %. The left ventricle has normal function. The left ventricle has no regional wall motion abnormalities. Left ventricular diastolic  parameters were normal. The average left ventricular global  longitudinal strain is -22.6 %. The global longitudinal strain is normal.  2. Right ventricular systolic function is normal. The right ventricular size is normal. Tricuspid regurgitation signal is inadequate for assessing PA pressure.  3. The mitral valve is normal in structure. No evidence of mitral valve regurgitation. No evidence of mitral stenosis.  4. The aortic valve is tricuspid. Aortic valve regurgitation is not visualized. No aortic stenosis is present.  5. The inferior vena cava is normal in size with greater than  50% respiratory variability, suggesting right atrial pressure of 3 mmHg. FINDINGS  Left Ventricle: Left ventricular ejection fraction, by estimation, is 60 to 65%. Left ventricular ejection fraction by 3D volume is 64 %. The left ventricle has normal function. The left ventricle has no regional wall motion abnormalities. The average left ventricular global longitudinal strain is -22.6 %. The global longitudinal strain is normal. The left ventricular internal cavity size was normal in size. There is no left ventricular hypertrophy. Left ventricular diastolic parameters were normal. Right Ventricle: The right ventricular size is normal. No increase in right ventricular wall thickness. Right ventricular systolic function is normal. Tricuspid regurgitation signal is inadequate for assessing PA pressure. Left Atrium: Left atrial size was normal in size. Right Atrium: Right atrial size was normal in size. Pericardium: There is no evidence of pericardial effusion. Mitral Valve: The mitral valve is normal in structure. No evidence of mitral valve regurgitation. No evidence of mitral valve stenosis. Tricuspid Valve: The tricuspid valve is normal in structure. Tricuspid valve regurgitation is trivial. Aortic Valve: The aortic valve is tricuspid. Aortic valve regurgitation is not visualized. No aortic stenosis is present. Pulmonic Valve: The pulmonic valve was not well visualized. Pulmonic valve  regurgitation is not visualized. Aorta: The aortic root and ascending aorta are structurally normal, with no evidence of dilitation. Venous: The inferior vena cava is normal in size with greater than 50% respiratory variability, suggesting right atrial pressure of 3 mmHg. IAS/Shunts: The interatrial septum was not well visualized.  LEFT VENTRICLE PLAX 2D LVIDd:         4.20 cm         Diastology LVIDs:         2.80 cm         LV e' medial:    7.51 cm/s LV PW:         0.90 cm         LV E/e' medial:  12.7 LV IVS:        0.80 cm         LV e' lateral:   9.68 cm/s LVOT diam:     2.30 cm         LV E/e' lateral: 9.8 LV SV:         88 LV SV Index:   55              2D LVOT Area:     4.15 cm        Longitudinal                                Strain                                2D Strain GLS  -22.6 % LV Volumes (MOD)               Avg: LV vol d, MOD    71.9 ml A2C:                           3D Volume EF LV vol d, MOD    76.0 ml       LV 3D EF:    Left A4C:  ventricul LV vol s, MOD    25.3 ml                    ar A2C:                                        ejection LV vol s, MOD    29.1 ml                    fraction A4C:                                        by 3D LV SV MOD A2C:   46.6 ml                    volume is LV SV MOD A4C:   76.0 ml                    64 %. LV SV MOD BP:    51.5 ml                                 3D Volume EF:                                3D EF:        64 %                                LV EDV:       124 ml                                LV ESV:       45 ml                                LV SV:        79 ml RIGHT VENTRICLE             IVC RV S prime:     12.20 cm/s  IVC diam: 1.70 cm TAPSE (M-mode): 2.3 cm LEFT ATRIUM             Index        RIGHT ATRIUM           Index LA diam:        2.70 cm 1.68 cm/m   RA Area:     11.10 cm LA Vol (A2C):   54.4 ml 33.90 ml/m  RA Volume:   22.90 ml  14.27 ml/m LA Vol (A4C):   19.3 ml 12.03 ml/m LA Biplane Vol:  35.8 ml 22.31 ml/m  AORTIC VALVE LVOT Vmax:   108.00 cm/s LVOT Vmean:  67.800 cm/s LVOT VTI:    0.212 m  AORTA Ao Root diam: 3.10 cm Ao Asc diam:  3.10 cm MITRAL VALVE MV Area (PHT): 3.88 cm     SHUNTS MV Decel Time: 196 msec     Systemic VTI:  0.21 m MV E velocity:  95.15 cm/s   Systemic Diam: 2.30 cm MV A velocity: 108.00 cm/s MV E/A ratio:  0.88 Epifanio Lesches MD Electronically signed by Epifanio Lesches MD Signature Date/Time: 12/05/2022/6:06:56 PM    Final       01/19/2023 Tumor Marker   Patient's tumor was tested for the following markers: CA-125. Results of the tumor marker test revealed 8.4.     PHYSICAL EXAMINATION: ECOG PERFORMANCE STATUS: 1 - Symptomatic but completely ambulatory  Vitals:   02/07/23 0932  BP: (!) 151/85  Pulse: 80  Resp: 18  Temp: 98.4 F (36.9 C)  SpO2: 100%   Filed Weights   02/07/23 0932  Weight: 138 lb (62.6 kg)    GENERAL:alert, no distress and comfortable LABORATORY DATA:  I have reviewed the data as listed    Component Value Date/Time   NA 138 02/07/2023 0913   NA 140 11/02/2016 1130   K 3.6 02/07/2023 0913   K 3.8 11/02/2016 1130   CL 106 02/07/2023 0913   CL 105 05/02/2012 0822   CO2 25 02/07/2023 0913   CO2 26 11/02/2016 1130   GLUCOSE 95 02/07/2023 0913   GLUCOSE 87 11/02/2016 1130   GLUCOSE 98 05/02/2012 0822   BUN 18 02/07/2023 0913   BUN 14.7 11/02/2016 1130   CREATININE 0.98 02/07/2023 0913   CREATININE 0.84 12/05/2018 1014   CREATININE 0.8 11/02/2016 1130   CALCIUM 9.1 02/07/2023 0913   CALCIUM 9.7 11/02/2016 1130   PROT 6.7 02/07/2023 0913   PROT 6.8 11/02/2016 1130   ALBUMIN 3.6 02/07/2023 0913   ALBUMIN 3.6 11/02/2016 1130   AST 28 02/07/2023 0913   AST 23 11/02/2016 1130   ALT 18 02/07/2023 0913   ALT 17 11/02/2016 1130   ALKPHOS 104 02/07/2023 0913   ALKPHOS 59 11/02/2016 1130   BILITOT 0.4 02/07/2023 0913   BILITOT 0.34 11/02/2016 1130   GFRNONAA 60 (L) 02/07/2023 0913   GFRAA >60 11/08/2017 0504    GFRAA >60 07/25/2017 1425    No results found for: "SPEP", "UPEP"  Lab Results  Component Value Date   WBC 4.0 02/07/2023   NEUTROABS 2.3 02/07/2023   HGB 11.4 (L) 02/07/2023   HCT 34.5 (L) 02/07/2023   MCV 100.9 (H) 02/07/2023   PLT 209 02/07/2023      Chemistry      Component Value Date/Time   NA 138 02/07/2023 0913   NA 140 11/02/2016 1130   K 3.6 02/07/2023 0913   K 3.8 11/02/2016 1130   CL 106 02/07/2023 0913   CL 105 05/02/2012 0822   CO2 25 02/07/2023 0913   CO2 26 11/02/2016 1130   BUN 18 02/07/2023 0913   BUN 14.7 11/02/2016 1130   CREATININE 0.98 02/07/2023 0913   CREATININE 0.84 12/05/2018 1014   CREATININE 0.8 11/02/2016 1130      Component Value Date/Time   CALCIUM 9.1 02/07/2023 0913   CALCIUM 9.7 11/02/2016 1130   ALKPHOS 104 02/07/2023 0913   ALKPHOS 59 11/02/2016 1130   AST 28 02/07/2023 0913   AST 23 11/02/2016 1130   ALT 18 02/07/2023 0913   ALT 17 11/02/2016 1130   BILITOT 0.4 02/07/2023 0913   BILITOT 0.34 11/02/2016 1130

## 2023-02-07 NOTE — Assessment & Plan Note (Signed)
Her blood count is stable She is not symptomatic Observe only

## 2023-02-07 NOTE — Assessment & Plan Note (Signed)
Overall, she tolerated treatment well without major side effects She has intermittent diarrhea which I suspect could be due to her magnesium supplement which I told her to discontinue I plan to repeat imaging study next month If she has positive response to therapy, I will follow-up with echocardiogram monitoring

## 2023-02-13 ENCOUNTER — Other Ambulatory Visit: Payer: Self-pay

## 2023-02-15 ENCOUNTER — Other Ambulatory Visit: Payer: Self-pay

## 2023-02-21 ENCOUNTER — Encounter (HOSPITAL_COMMUNITY): Payer: Self-pay

## 2023-02-21 ENCOUNTER — Ambulatory Visit (HOSPITAL_COMMUNITY)
Admission: RE | Admit: 2023-02-21 | Discharge: 2023-02-21 | Disposition: A | Discharge status: Home / Self Care | Payer: Medicare HMO | Source: Ambulatory Visit | Attending: Hematology and Oncology | Admitting: Hematology and Oncology

## 2023-02-21 DIAGNOSIS — C55 Malignant neoplasm of uterus, part unspecified: Secondary | ICD-10-CM | POA: Diagnosis not present

## 2023-02-21 DIAGNOSIS — R918 Other nonspecific abnormal finding of lung field: Secondary | ICD-10-CM | POA: Diagnosis not present

## 2023-02-21 DIAGNOSIS — R911 Solitary pulmonary nodule: Secondary | ICD-10-CM | POA: Diagnosis not present

## 2023-02-21 DIAGNOSIS — C549 Malignant neoplasm of corpus uteri, unspecified: Secondary | ICD-10-CM | POA: Diagnosis not present

## 2023-02-21 MED ORDER — IOHEXOL 300 MG/ML  SOLN
100.0000 mL | Freq: Once | INTRAMUSCULAR | Status: AC | PRN
  Administered 2023-02-21: 100 mL via INTRAVENOUS

## 2023-02-21 MED ORDER — HEPARIN SOD (PORK) LOCK FLUSH 100 UNIT/ML IV SOLN
INTRAVENOUS | Status: AC
  Filled 2023-02-21: qty 5

## 2023-02-21 MED ORDER — HEPARIN SOD (PORK) LOCK FLUSH 100 UNIT/ML IV SOLN
500.0000 [IU] | Freq: Once | INTRAVENOUS | Status: AC
  Administered 2023-02-21: 500 [IU] via INTRAVENOUS

## 2023-02-27 MED FILL — Fosaprepitant Dimeglumine For IV Infusion 150 MG (Base Eq): INTRAVENOUS | Qty: 5 | Status: AC

## 2023-02-28 ENCOUNTER — Inpatient Hospital Stay: Payer: Medicare HMO

## 2023-02-28 ENCOUNTER — Inpatient Hospital Stay: Payer: Medicare HMO | Attending: Hematology and Oncology | Admitting: Hematology and Oncology

## 2023-02-28 ENCOUNTER — Encounter: Payer: Self-pay | Admitting: Hematology and Oncology

## 2023-02-28 VITALS — BP 136/78 | HR 85 | Temp 98.5°F | Resp 18 | Ht 59.5 in | Wt 140.6 lb

## 2023-02-28 DIAGNOSIS — N183 Chronic kidney disease, stage 3 unspecified: Secondary | ICD-10-CM | POA: Diagnosis not present

## 2023-02-28 DIAGNOSIS — Z5112 Encounter for antineoplastic immunotherapy: Secondary | ICD-10-CM | POA: Diagnosis not present

## 2023-02-28 DIAGNOSIS — C549 Malignant neoplasm of corpus uteri, unspecified: Secondary | ICD-10-CM

## 2023-02-28 DIAGNOSIS — D631 Anemia in chronic kidney disease: Secondary | ICD-10-CM | POA: Diagnosis not present

## 2023-02-28 DIAGNOSIS — N189 Chronic kidney disease, unspecified: Secondary | ICD-10-CM | POA: Diagnosis not present

## 2023-02-28 DIAGNOSIS — C541 Malignant neoplasm of endometrium: Secondary | ICD-10-CM | POA: Insufficient documentation

## 2023-02-28 LAB — CMP (CANCER CENTER ONLY)
ALT: 22 U/L (ref 0–44)
AST: 31 U/L (ref 15–41)
Albumin: 3.5 g/dL (ref 3.5–5.0)
Alkaline Phosphatase: 126 U/L (ref 38–126)
Anion gap: 7 (ref 5–15)
BUN: 22 mg/dL (ref 8–23)
CO2: 26 mmol/L (ref 22–32)
Calcium: 9 mg/dL (ref 8.9–10.3)
Chloride: 106 mmol/L (ref 98–111)
Creatinine: 0.95 mg/dL (ref 0.44–1.00)
GFR, Estimated: >60 mL/min (ref >60)
Glucose, Bld: 98 mg/dL (ref 70–99)
Potassium: 3.3 mmol/L — ABNORMAL LOW (ref 3.5–5.1)
Sodium: 139 mmol/L (ref 135–145)
Total Bilirubin: 0.3 mg/dL (ref 0.0–1.2)
Total Protein: 6.3 g/dL — ABNORMAL LOW (ref 6.5–8.1)

## 2023-02-28 LAB — CBC WITH DIFFERENTIAL (CANCER CENTER ONLY)
Abs Immature Granulocytes: 0.01 10*3/uL (ref 0.00–0.07)
Basophils Absolute: 0.1 10*3/uL (ref 0.0–0.1)
Basophils Relative: 1 %
Eosinophils Absolute: 0.1 10*3/uL (ref 0.0–0.5)
Eosinophils Relative: 3 %
HCT: 32.8 % — ABNORMAL LOW (ref 36.0–46.0)
Hemoglobin: 10.9 g/dL — ABNORMAL LOW (ref 12.0–15.0)
Immature Granulocytes: 0 %
Lymphocytes Relative: 20 %
Lymphs Abs: 0.8 10*3/uL (ref 0.7–4.0)
MCH: 33 pg (ref 26.0–34.0)
MCHC: 33.2 g/dL (ref 30.0–36.0)
MCV: 99.4 fL (ref 80.0–100.0)
Monocytes Absolute: 0.6 10*3/uL (ref 0.1–1.0)
Monocytes Relative: 15 %
Neutro Abs: 2.5 10*3/uL (ref 1.7–7.7)
Neutrophils Relative %: 61 %
Platelet Count: 200 10*3/uL (ref 150–400)
RBC: 3.3 MIL/uL — ABNORMAL LOW (ref 3.87–5.11)
RDW: 16.8 % — ABNORMAL HIGH (ref 11.5–15.5)
WBC Count: 4.1 10*3/uL (ref 4.0–10.5)
nRBC: 0 % (ref 0.0–0.2)

## 2023-02-28 MED ORDER — SODIUM CHLORIDE 0.9% FLUSH
10.0000 mL | INTRAVENOUS | Status: DC | PRN
  Administered 2023-02-28: 10 mL

## 2023-02-28 MED ORDER — FAM-TRASTUZUMAB DERUXTECAN-NXKI CHEMO 100 MG IV SOLR
5.4000 mg/kg | Freq: Once | INTRAVENOUS | Status: AC
  Administered 2023-02-28: 340 mg via INTRAVENOUS
  Filled 2023-02-28: qty 17

## 2023-02-28 MED ORDER — DEXAMETHASONE SODIUM PHOSPHATE 10 MG/ML IJ SOLN
10.0000 mg | Freq: Once | INTRAMUSCULAR | Status: AC
  Administered 2023-02-28: 10 mg via INTRAVENOUS
  Filled 2023-02-28: qty 1

## 2023-02-28 MED ORDER — SODIUM CHLORIDE 0.9 % IV SOLN
150.0000 mg | Freq: Once | INTRAVENOUS | Status: AC
  Administered 2023-02-28: 150 mg via INTRAVENOUS
  Filled 2023-02-28: qty 150

## 2023-02-28 MED ORDER — SODIUM CHLORIDE 0.9% FLUSH
10.0000 mL | Freq: Once | INTRAVENOUS | Status: AC
  Administered 2023-02-28: 10 mL

## 2023-02-28 MED ORDER — DEXTROSE 5 % IV SOLN
Freq: Once | INTRAVENOUS | Status: AC
Start: 2023-02-28 — End: 2023-02-28

## 2023-02-28 MED ORDER — HEPARIN SOD (PORK) LOCK FLUSH 100 UNIT/ML IV SOLN
500.0000 [IU] | Freq: Once | INTRAVENOUS | Status: AC | PRN
  Administered 2023-02-28: 500 [IU]

## 2023-02-28 MED ORDER — PALONOSETRON HCL INJECTION 0.25 MG/5ML
0.2500 mg | Freq: Once | INTRAVENOUS | Status: AC
  Administered 2023-02-28: 0.25 mg via INTRAVENOUS
  Filled 2023-02-28: qty 5

## 2023-02-28 NOTE — Patient Instructions (Signed)

## 2023-02-28 NOTE — Assessment & Plan Note (Signed)
 I have reviewed multiple imaging studies with the patient Overall, she has positive response to therapy The plan will be to continue chemotherapy as scheduled I plan to order echocardiogram in 2 weeks I plan to repeat imaging study again in May

## 2023-02-28 NOTE — Assessment & Plan Note (Signed)
 Her blood count is stable She is not symptomatic Observe only

## 2023-02-28 NOTE — Progress Notes (Signed)
 Kayla Mcgrath OFFICE PROGRESS NOTE  Patient Care Team: Kayla Delay, Kayla Mcgrath as PCP - General (Hematology and Oncology) Kayla Beat, Kayla Mcgrath as Consulting Physician (Family Medicine)  ASSESSMENT & PLAN:  Carcinosarcoma of body of uterus [facility]) I have reviewed multiple imaging studies with the patient Overall, she has positive response to therapy The plan will be to continue chemotherapy as scheduled I plan to order echocardiogram in 2 weeks I plan to repeat imaging study again in May  Anemia in chronic kidney disease Her blood count is stable She is not symptomatic Observe only  Orders Placed This Encounter  Procedures   CBC with Differential (Cancer Mcgrath Only)    Standing Status:   Future    Expected Date:   03/21/2023    Expiration Date:   03/20/2024   CMP (Cancer Mcgrath only)    Standing Status:   Future    Expected Date:   03/21/2023    Expiration Date:   03/20/2024   CBC with Differential ([facility] Only)    Standing Status:   Future    Expected Date:   04/11/2023    Expiration Date:   04/10/2024   CMP (Cancer Mc[facility] only)    Standing Status:   Future    Expected Date:   04/11/2023    Expiration Date:   04/10/2024   ECHOCARDIOGRAM COMPLETE    Standing Status:   Future    Expected Date:   03/14/2023    Expiration Date:   02/28/2024    Where should this test be performed:   [facility]    Perflutren DEFINITY (image enhancing agent) should be administered unless hypersensitivity or allergy exist:   Administer Perflutren    Reason for exam-Echo:   Chemo  Z09    All questions were answered. The patient knows to call the clinic with any problems, questions or concerns. The total time spent in the appointment was 40 minutes encounter with patients including review of chart and various tests results, discussions about plan of care and coordination of care plan   Kayla Delay, Kayla Mcgrath 02/28/2023 10:55 AM  INTERVAL HISTORY: Please see below for problem oriented charting. she  returns for chemo follow-up and review of CT imaging results She tolerated recent treatment well She discontinue magnesium supplement and her diarrhea went away We reviewed multiple imaging studies and discussed future plan of care  REVIEW OF SYSTEMS:   Constitutional: Denies fevers, chills or abnormal weight loss Eyes: Denies blurriness of vision Ears, nose, mouth, throat, and face: Denies mucositis or sore throat Respiratory: Denies cough, dyspnea or wheezes Cardiovascular: Denies palpitation, chest discomfort or lower extremity swelling Gastrointestinal:  Denies nausea, heartburn or change in bowel habits Skin: Denies abnormal skin rashes Lymphatics: Denies new lymphadenopathy or easy bruising Neurological:Denies numbness, tingling or new weaknesses Behavioral/Psych: Mood is stable, no new changes  All other systems were reviewed with the patient and are negative.  I have reviewed the past medical history, past surgical history, social history and family history with the patient and they are unchanged from previous note.  ALLERGIES:  is allergic to tylenol [acetaminophen] and latex.  MEDICATIONS:  Current Outpatient Medications  Medication Sig Dispense Refill   Cyanocobalamin (VITAMIN B 12 PO) Take 1 tablet by mouth.     escitalopram (LEXAPRO) 10 MG tablet TAKE 1 TABLET EVERY DAY 90 tablet 0   escitalopram (LEXAPRO) 20 MG tablet Take 1 tablet (20 mg total) by mouth daily. 90 tablet 0   ondansetron (ZOFRAN) 8  MG tablet Take 1 tablet (8 mg total) by mouth every 8 (eight) hours as needed for nausea or vomiting. Start on the third day after chemotherapy. 30 tablet 1   pantoprazole (PROTONIX) 40 MG tablet Take 1 tablet (40 mg total) by mouth daily. 90 tablet 1   prochlorperazine (COMPAZINE) 10 MG tablet Take 1 tablet (10 mg total) by mouth every 6 (six) hours as needed for nausea or vomiting. 30 tablet 1   traMADol (ULTRAM) 50 MG tablet Take 1 tablet (50 mg total) by mouth every 6 (six)  hours as needed. 60 tablet 0   VITAMIN D, CHOLECALCIFEROL, PO Take 1 tablet by mouth daily.     No current facility-administered medications for this visit.   Facility-Administered Medications Ordered in Other Visits  Medication Dose Route Frequency Provider Last Rate Last Admin   fam-trastuzumab deruxtecan-nxki (ENHERTU) 340 mg in dextrose 5 % 100 mL chemo infusion  5.4 mg/kg (Treatment Plan Recorded) Intravenous Once Kayla Mcgrath, Kayla Kendzierski, Kayla Mcgrath       fosaprepitant (EMEND) 150 mg in sodium chloride 0.9 % 145 mL IVPB  150 mg Intravenous Once Kayla Mcgrath, Kayla Yeagle, Kayla Mcgrath 450 mL/hr at 02/28/23 1049 150 mg at 02/28/23 1049   heparin lock flush 100 unit/mL  500 Units Intracatheter Once PRN Kayla Mcgrath, Kayla Metzer, Kayla Mcgrath       sodium chloride flush (NS) 0.9 % injection 10 mL  10 mL Intracatheter PRN Kayla Mcgrath, Kayla Rasheed, Kayla Mcgrath        SUMMARY OF ONCOLOGIC HISTORY: Oncology History Overview Note  MMR normal MSI stable PD-L1 CPS: 1% Her2 positive P53 + Carcinosarcoma Neg genetics ER negative   Carcinosarcoma of body of uterus (HCC)  09/23/2021 Initial Diagnosis   She presented with PMB   09/28/2021 Imaging   US pelvis Enlarged uterus with fibroids. Pelvic sonogram is otherwise unremarkable.    11/11/2021 Pathology Results   A. CERVIX, MASS, EXCISION:  - HIGH-GRADE UNDIFFERENTIATED MALIGNANCY WITH EXTENSIVE NECROSIS (SEE COMMENT).   COMMENT:  The tumor consists of mostly high-grade sarcomatoid morphology with focal epithelioid morphology.  Immunohistochemical stains are performed on block A4.  The tumor cells are positive for desmin and p16 while negative for CD45, CK7, CK20, chromogranin, synaptophysin, Melan-A, p40, AE1/AE3, SMA, p63, cyclin D1, S100 and smooth muscle myosin.  Ki-67 proliferation index is high.  CD10 staining shows focal nonspecific staining.  The differential diagnosis includes a high-grade sarcoma and a partially sampled carcinosarcoma.    11/25/2021 Imaging   MRI pelvis Marked distention of the entire endometrial  cavity by heterogeneously enhancing soft tissue density, which extends through the endocervical canal into the lower vagina. This is highly suspicious for endometrial carcinoma.   Diffuse myometrial thinning due to marked distention of endometrial cavity limits evaluation; deep myometrial invasion cannot be excluded in the uterine fundus.   No evidence of extra-uterine extension of tumor.   Lymphadenopathy in lower abdominal retroperitoneum, bilateral iliac chains, and sigmoid mesocolon, consistent with metastatic disease.   Sigmoid diverticulosis. No radiographic evidence of diverticulitis.   11/26/2021 Initial Diagnosis   Uterine cancer (HCC)   11/26/2021 Cancer Staging   Staging form: Corpus Uteri - Carcinoma and Carcinosarcoma, AJCC 8th Edition - Clinical stage from 11/26/2021: FIGO Stage IIIC2 (cT3, cN2, cM0) - Signed by Kayla Delay, Kayla Mcgrath on 11/26/2021 Stage prefix: Initial diagnosis Histologic grade (G): G3 Histologic grading system: 3 grade system   11/30/2021 Pathology Results   FINAL MICROSCOPIC DIAGNOSIS:  A. UTERINE CONTENTS, BIOPSY: - HIGH-GRADE UNDIFFERENTIATED MALIGNANCY WITH EXTENSIVE NECROSIS (SEE COMMENT).  COMMENT: The  patient's history of a high-grade undifferentiated malignancy with extensive necrosis of the cervix is noted.  The morphological features of the current tumor cells are similar to the tumor cells seen in the previous cervical mass excision.  Immunohistochemical staining for desmin and cytokeratin AE1/AE3 is performed on block A1.  The tumor cells are diffusely positive for desmin.   AE1/AE3 stain is focally positive, however this is scant and of unclear clinical significance. Given the small quantity of tissue in the current biopsy, a definitive distinction between a high-grade sarcoma and carcinosarcoma cannot be made as this biopsy may not be representative of the entire tumor. If clinically indicated, this case as well as the previous cervical  mass excision ([id number]) may be sent out for expert consultation. Clinical correlation recommended.     12/01/2021 Tumor Marker   Patient's tumor was tested for the following markers: CA-125. Results of the tumor marker test revealed 491.   12/22/2021 Surgery   Robotic-assisted laparoscopic total hysterectomy with bilateral salpingo-oophorectomy, tumor debulking including high common iliac lymph node excision, aborted attempt of bilateral external iliac enlarged lymph nodes, mini-lap for specimen delivery, cystoscopy    12/22/2021 Pathology Results   FINAL MICROSCOPIC DIAGNOSIS:  A. LEFT CERVICAL MARGIN, EXCISION: - Positive for carcinoma  B. HIGH LEFT COMMON ILIAC LYMPH NODE, EXCISION: - Metastatic carcinosarcoma involving the lymph node  C. 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, including undifferentiated sarcoma with rhabdoid features and high-grade serous carcinoma, see comment - Tumor invades for a depth of 1 mm where myometrial thickness is 5 mm - Benign bilateral fallopian tubes and ovaries - See oncology table - See comment  ONCOLOGY TABLE:  UTERUS, CARCINOMA OR CARCINOSARCOMA: Resection  Procedure: Total hysterectomy and bilateral salpingo-oophorectomy Histologic Type: Carcinosarcoma (mixed malignant Mullerian tumor) Histologic Grade: High-grade Myometrial Invasion:      Depth of Myometrial Invasion (mm): 1 mm      Myometrial Thickness (mm): 5 mm      Percentage of Myometrial Invasion: 20% Uterine Serosa Involvement: Not identified Cervical stromal Involvement: Present Extent of involvement of other tissue/organs: Not identified Peritoneal/Ascitic Fluid: Not applicable Lymphovascular Invasion: Present Regional Lymph Nodes:      Pelvic Lymph Nodes Examined:                                  0 Sentinel                                  1 Non-sentinel                                   1 Total      Pelvic Lymph Nodes with Metastasis: 1                          Macrometastasis: (>2.0 mm): 1                          Micrometastasis: (>0.2 mm and < 2.0 mm): 0                          Isolated Tumor Cells (<0.2 mm): 0  Laterality of Lymph Node with Tumor: Left                          Extracapsular Extension: Not identified      Para-aortic Lymph Nodes Examined:                                   0 Sentinel                                   0 non-sentinel                                   0 total Distant Metastasis:      Distant Site(s) Involved: Not applicable Pathologic Stage Classification (pTNM, AJCC 8th Edition): pT1a, pN1 Ancillary Studies: MMR / MSI testing will be ordered Representative Tumor Block: C1 Comment(s): None  ([id number])       01/13/2022 Procedure   Placement of single lumen port a cath via right internal jugular vein. The catheter tip lies at the cavo-atrial junction. A power injectable port a cath was placed and is ready for immediate use.     01/19/2022 Echocardiogram    1. Left ventricular ejection fraction, by estimation, is 60 to 65%. The left ventricle has normal function. The left ventricle has no regional wall motion abnormalities. Left ventricular diastolic parameters are consistent with Grade I diastolic dysfunction (impaired relaxation). The average left ventricular global longitudinal strain is -24.0 %. The global longitudinal strain is normal.  2. Right ventricular systolic function is normal. The right ventricular size is normal. There is normal pulmonary artery systolic pressure.  3. Left atrial size was mildly dilated.  4. The mitral valve is normal in structure. No evidence of mitral valve regurgitation. No evidence of mitral stenosis.  5. The aortic valve was not well visualized. Aortic valve regurgitation is not visualized. Aortic valve sclerosis is present, with no evidence of aortic valve stenosis.   6. The inferior vena cava is normal in size with greater than 50% respiratory variability, suggesting right atrial pressure of 3 mmHg.     01/27/2022 Tumor Marker   Patient's tumor was tested for the following markers: CA-125. Results of the tumor marker test revealed 307.   02/14/2022 - 08/22/2022 Chemotherapy   Patient is on Treatment Plan : UTERINE SEROUS CARCINOMA Carboplatin + Paclitaxel + Trastuzumab q21d x 6 Cycles / Trastuzumab q21d     02/15/2022 Tumor Marker   Patient's tumor was tested for the following markers: CA-125. Results of the tumor marker test revealed 161.   03/29/2022 Tumor Marker   Patient's tumor was tested for the following markers: CA-125. Results of the tumor marker test revealed 30.   04/13/2022 Echocardiogram   1. Left ventricular ejection fraction, by estimation, is 60 to 65%. The left ventricle has normal function. The left ventricle has no regional wall motion abnormalities. Left ventricular diastolic parameters are consistent with Grade I diastolic dysfunction (impaired relaxation). The average left ventricular global longitudinal strain is -19.4 %. The global longitudinal strain is normal.  2. Right ventricular systolic function is normal. The right ventricular size is normal. There is normal pulmonary artery systolic pressure. The estimated right ventricular systolic pressure is 21.0 mmHg.  3. Left atrial size  was mild to moderately dilated.  4. The mitral valve is grossly normal. Trivial mitral valve regurgitation.  5. The aortic valve is tricuspid. Aortic valve regurgitation is not visualized. Aortic valve sclerosis is present, with no evidence of aortic valve stenosis.  6. The inferior vena cava is normal in size with greater than 50% respiratory variability, suggesting right atrial pressure of 3 mmHg.   06/01/2022 Tumor Marker   Patient's tumor was tested for the following markers: CA-125. Results of the tumor marker test revealed 8.   06/05/2022 Genetic  Testing   Negative genetic testing on the Multi-Cancer gene panel + RNA.  The report date is Jun 05, 2022.  The Multi-Cancer + RNA Panel offered by [facility] includes sequencing and/or deletion/duplication analysis of the following 70 genes:  AIP*, ALK, APC*, ATM*, AXIN2*, BAP1*, BARD1*, BLM*, BMPR1A*, BRCA1*, BRCA2*, BRIP1*, CDC73*, CDH1*, CDK4, CDKN1B*, CDKN2A, CHEK2*, CTNNA1*, DICER1*, EPCAM (del/dup only), EGFR, FH*, FLCN*, GREM1 (promoter dup only), HOXB13, KIT, LZTR1, MAX*, MBD4, MEN1*, MET, MITF, MLH1*, MSH2*, MSH3*, MSH6*, MUTYH*, NF1*, NF2*, NTHL1*, PALB2*, PDGFRA, PMS2*, POLD1*, POLE*, POT1*, PRKAR1A*, PTCH1*, PTEN*, RAD51C*, RAD51D*, RB1*, RET, SDHA* (sequencing only), SDHAF2*, SDHB*, SDHC*, SDHD*, SMAD4*, SMARCA4*, SMARCB1*, SMARCE1*, STK11*, SUFU*, TMEM127*, TP53*, TSC1*, TSC2*, VHL*. RNA analysis is performed for * genes.    06/20/2022 Imaging   1. Enlarged periaortic and iliac lymph nodes consistent metastatic disease. Bulky iliac node on the LEFT. 2. No evidence of peritoneal metastasis. 3. Post hysterectomy and oophorectomy.   07/13/2022 Tumor Marker   Patient's tumor was tested for the following markers: CA-125. Results of the tumor marker test revealed 8.8.   07/27/2022 Echocardiogram   ECHOCARDIOGRAM COMPLETE  Result Date: 07/27/2022    ECHOCARDIOGRAM REPORT   Patient Name:   Kayla Mcgrath Date of Exam: 07/27/2022 Medical Rec #:  [id number]      Height:       59.5 in Accession #:    [id number]     Weight:       140.4 lb Date of Birth:  03/22/1945      BSA:          1.596 m Patient Age:    78 years       BP:           135/92 mmHg Patient Gender: F              HR:           69 bpm. Exam Location:  Outpatient Procedure: 2D Echo, Color Doppler, Cardiac Doppler and Strain Analysis Indications:    Chemo  History:        Patient has prior history of Echocardiogram examinations, most                 recent 04/13/2022. Breast Cancer, Uterine Cancer, CKD; Risk                 Factors:Dyslipidemia.   Sonographer:    Milbert Coulter Referring Phys: [id number] Loel Betancur  Sonographer Comments: Global longitudinal strain was attempted. IMPRESSIONS  1. Prior GLS was -19.4 on 04/13/22 suggest f/u echo in 3 months to make sure not seeing early myocardial issues . Left ventricular ejection fraction, by estimation, is 55 to 60%. The left ventricle has normal function. The left ventricle has no regional wall motion abnormalities. Left ventricular diastolic parameters were normal. The average left ventricular global longitudinal strain is -15.9 %. The global longitudinal strain is normal.  2. Right ventricular systolic function is normal. The  right ventricular size is normal.  3. The mitral valve is normal in structure. No evidence of mitral valve regurgitation. No evidence of mitral stenosis.  4. The aortic valve was not well visualized. There is mild calcification of the aortic valve. Aortic valve regurgitation is not visualized. Aortic valve sclerosis is present, with no evidence of aortic valve stenosis.  5. The inferior vena cava is normal in size with greater than 50% respiratory variability, suggesting right atrial pressure of 3 mmHg. FINDINGS  Left Ventricle: Prior GLS was -19.4 on 04/13/22 suggest f/u echo in 3 months to make sure not seeing early myocardial issues. Left ventricular ejection fraction, by estimation, is 55 to 60%. The left ventricle has normal function. The left ventricle has no regional wall motion abnormalities. The average left ventricular global longitudinal strain is -15.9 %. The global longitudinal strain is normal. The left ventricular internal cavity size was normal in size. There is no left ventricular hypertrophy. Left ventricular diastolic parameters were normal. Right Ventricle: The right ventricular size is normal. No increase in right ventricular wall thickness. Right ventricular systolic function is normal. Left Atrium: Left atrial size was normal in size. Right Atrium: Right atrial size was  normal in size. Pericardium: There is no evidence of pericardial effusion. Mitral Valve: The mitral valve is normal in structure. No evidence of mitral valve regurgitation. No evidence of mitral valve stenosis. Tricuspid Valve: The tricuspid valve is normal in structure. Tricuspid valve regurgitation is not demonstrated. No evidence of tricuspid stenosis. Aortic Valve: The aortic valve was not well visualized. There is mild calcification of the aortic valve. Aortic valve regurgitation is not visualized. Aortic valve sclerosis is present, with no evidence of aortic valve stenosis. Aortic valve mean gradient measures 4.0 mmHg. Aortic valve peak gradient measures 8.2 mmHg. Aortic valve area, by VTI measures 1.40 cm. Pulmonic Valve: The pulmonic valve was normal in structure. Pulmonic valve regurgitation is not visualized. No evidence of pulmonic stenosis. Aorta: The aortic root is normal in size and structure. Venous: The inferior vena cava is normal in size with greater than 50% respiratory variability, suggesting right atrial pressure of 3 mmHg. IAS/Shunts: No atrial level shunt detected by color flow Doppler.  LEFT VENTRICLE PLAX 2D LVIDd:         4.00 cm   Diastology LVIDs:         2.60 cm   LV e' medial:    5.77 cm/s LV PW:         0.90 cm   LV E/e' medial:  10.8 LV IVS:        0.80 cm   LV e' lateral:   13.50 cm/s LVOT diam:     1.70 cm   LV E/e' lateral: 4.6 LV SV:         41 LV SV Index:   26        2D Longitudinal Strain LVOT Area:     2.27 cm  2D Strain GLS Avg:     -15.9 %  RIGHT VENTRICLE RV Basal diam:  2.70 cm RV Mid diam:    2.10 cm RV S prime:     12.80 cm/s TAPSE (M-mode): 2.1 cm LEFT ATRIUM             Index        RIGHT ATRIUM          Index LA diam:        2.70 cm 1.69 cm/m   RA Area:  8.87 cm LA Vol (A2C):   35.4 ml 22.18 ml/m  RA Volume:   16.30 ml 10.21 ml/m LA Vol (A4C):   36.6 ml 22.93 ml/m LA Biplane Vol: 36.1 ml 22.62 ml/m  AORTIC VALVE AV Area (Vmax):    1.38 cm AV Area (Vmean):    1.35 cm AV Area (VTI):     1.40 cm AV Vmax:           143.00 cm/s AV Vmean:          93.400 cm/s AV VTI:            0.293 m AV Peak Grad:      8.2 mmHg AV Mean Grad:      4.0 mmHg LVOT Vmax:         87.00 cm/s LVOT Vmean:        55.700 cm/s LVOT VTI:          0.180 m LVOT/AV VTI ratio: 0.62  AORTA Ao Root diam: 2.80 cm Ao Asc diam:  3.00 cm MITRAL VALVE               TRICUSPID VALVE MV Area (PHT): 3.30 cm    TR Peak grad:   21.7 mmHg MV Decel Time: 230 msec    TR Vmax:        233.00 cm/s MV E velocity: 62.10 cm/s MV A velocity: 87.80 cm/s  SHUNTS MV E/A ratio:  0.71        Systemic VTI:  0.18 m                            Systemic Diam: 1.70 cm Charlton Haws Kayla Mcgrath Electronically signed by Charlton Haws Kayla Mcgrath Signature Date/Time: 07/27/2022/10:27:37 AM    Final       08/23/2022 Tumor Marker   Patient's tumor was tested for the following markers: CA-125. Results of the tumor marker test revealed 7.2.   09/11/2022 Imaging   CT CHEST ABDOMEN PELVIS W CONTRAST  Result Date: 09/12/2022 CLINICAL DATA:  evaluate treatment response of uterine cancer/carcinosarcoma of uterus/endometrium. * Tracking Code: BO * EXAM: CT CHEST, ABDOMEN, AND PELVIS WITH CONTRAST TECHNIQUE: Multidetector CT imaging of the chest, abdomen and pelvis was performed following the standard protocol during bolus administration of intravenous contrast. RADIATION DOSE REDUCTION: This exam was performed according to the departmental dose-optimization program which includes automated exposure control, adjustment of the mA and/or kV according to patient size and/or use of iterative reconstruction technique. CONTRAST:  80mL OMNIPAQUE IOHEXOL 300 MG/ML  SOLN COMPARISON:  06/13/2022 abdominopelvic CT. Most recent chest CT 12/01/2021 FINDINGS: CT CHEST FINDINGS Cardiovascular: right Port-A-Cath terminates at the high right atrium. Aortic atherosclerosis. Normal heart size, without pericardial effusion. Lad coronary artery calcification. No central pulmonary  embolism, on this non-dedicated study. Mediastinum/Nodes: 1.4 cm right thyroid nodule. Not clinically significant; no follow-up imaging recommended (ref: J Am Coll Radiol. 2015 Feb;12(2): 143-50). No supraclavicular adenopathy. No axillary adenopathy. Right axillary node dissection. No mediastinal or hilar adenopathy. Esophageal fluid level on 23/2. Lungs/Pleura: No pleural fluid. Posterior central left upper lobe 7 x 6 mm nodule on 42/6 is similar to on image 46 of the prior. Musculoskeletal: Right breast implant. No acute osseous abnormality. CT ABDOMEN PELVIS FINDINGS Hepatobiliary: There may be a tiny cyst in segment 4A anteriorly. Normal gallbladder, without biliary ductal dilatation. Pancreas: Normal, without mass or ductal dilatation. Spleen: Normal in size, without focal abnormality. Adrenals/Urinary Tract: Normal adrenal glands. Normal  kidneys, without hydronephrosis. Normal urinary bladder. Stomach/Bowel: Apparent proximal gastric wall thickening including on 43/2 is most likely due to underdistention. Normal distal stomach. Extensive colonic diverticulosis. Normal terminal ileum. Normal small bowel. Vascular/Lymphatic: Aortic atherosclerosis. Index left periaortic node measures 1.2 x 1.7 cm on 71/2 versus 1.3 x 1.7 cm on the prior. A preaortic/pre caval 12 mm node on 72/2 is enlarged from 7 mm on the prior exam (when remeasured). More cephalad left periaortic node measures 1.0 cm on 60/2 versus 1.1 cm on the prior. Index right common iliac node measures 9 mm on 82/2 versus 13 mm on the prior. Right external iliac centrally necrotic node measures 1.4 cm on 95/2 versus 1.2 cm on the prior. Dominant left external iliac nodal mass measures 3.1 x 3.4 cm on 92/2 versus 2.3 x 2.8 cm on the prior exam Reproductive: Hysterectomy.  No adnexal mass. Other: No significant free fluid. No evidence of omental or peritoneal disease. Musculoskeletal: Osteopenia. S shaped thoracolumbar spine curvature. IMPRESSION: CT CHEST  IMPRESSION 1. 7 x 6 mm left upper lobe pulmonary nodule is similar compared to 12/01/2021. Stability favors a benign etiology. Recommend attention on follow-up to exclude unlikely indolent primary neoplasm or isolated metastasis. 2. No thoracic adenopathy. 3. Coronary artery atherosclerosis. Aortic Atherosclerosis (ICD10-I70.0). 4. Esophageal air fluid level suggests dysmotility or gastroesophageal reflux. CT ABDOMEN AND PELVIS IMPRESSION 1. Mixed response, with overall progression of nodal metastasis. Although some nodes are smaller, the dominant left pelvic sidewall nodal mass has significantly enlarged. 2. No new sites of metastatic disease identified. Electronically Signed   By: Jeronimo Greaves M.D.   On: 09/12/2022 11:07   MM 3D SCREENING MAMMOGRAM UNILATERAL LEFT BREAST  Result Date: 09/05/2022 CLINICAL DATA:  Screening. EXAM: DIGITAL SCREENING UNILATERAL LEFT MAMMOGRAM WITH CAD AND TOMOSYNTHESIS TECHNIQUE: Left screening digital craniocaudal and mediolateral oblique mammograms were obtained. Left screening digital breast tomosynthesis was performed. The images were evaluated with computer-aided detection. COMPARISON:  Previous exam(s). ACR Breast Density Category c: The breasts are heterogeneously dense, which may obscure small masses. FINDINGS: The patient has had a right mastectomy. There are no findings suspicious for malignancy. IMPRESSION: No mammographic evidence of malignancy. A result letter of this screening mammogram will be mailed directly to the patient. RECOMMENDATION: Screening mammogram in one year.  (Code:[id number]) BI-RADS CATEGORY  1: Negative. Electronically Signed   By: Ted Mcalpine M.D.   On: 09/05/2022 11:09      10/04/2022 - 10/04/2022 Chemotherapy   Patient is on Treatment Plan : Uterine Carboplatin + Paclitaxel + Bevacizumab q21d     10/04/2022 -  Chemotherapy   Patient is on Treatment Plan : BREAST METASTATIC Fam-Trastuzumab Deruxtecan-nxki (Enhertu) (5.4) q21d      11/19/2022 Tumor Marker   Patient's tumor was tested for the following markers: CA-125. Results of the tumor marker test revealed 8.5.   11/29/2022 Imaging   CT ABDOMEN PELVIS W CONTRAST  Result Date: 12/07/2022 CLINICAL DATA:  Uterine sarcoma, assess treatment response. * Tracking Code: BO * EXAM: CT ABDOMEN AND PELVIS WITH CONTRAST TECHNIQUE: Multidetector CT imaging of the abdomen and pelvis was performed using the standard protocol following bolus administration of intravenous contrast. RADIATION DOSE REDUCTION: This exam was performed according to the departmental dose-optimization program which includes automated exposure control, adjustment of the mA and/or kV according to patient size and/or use of iterative reconstruction technique. CONTRAST:  OMNIPAQUE IOHEXOL 300 MG/ML  SOLN COMPARISON:  09/08/2022. FINDINGS: Lower chest: Mild scattered pulmonary parenchymal scarring. No acute findings.  Heart is enlarged. No pericardial or pleural effusion. Distal esophagus is grossly unremarkable. Hepatobiliary: Tiny low-attenuation lesion in the left hepatic lobe, too small to characterize. Liver and gallbladder are otherwise unremarkable. No biliary ductal dilatation. Pancreas: Negative. Spleen: Tiny low-attenuation lesions in the spleen, likely cysts. Adrenals/Urinary Tract: Adrenal glands are unremarkable. Millimetric low-attenuation lesions in the kidneys, too small to characterize. No specific follow-up necessary. Kidneys are otherwise unremarkable. Ureters are decompressed. Bladder is grossly unremarkable. Stomach/Bowel: Stomach, small bowel, appendix and colon are unremarkable. Vascular/Lymphatic: Atherosclerotic calcification of aorta. Generally similar abdominal and pelvic retroperitoneal lymph nodes with index heterogeneous 11 mm left periaortic lymph node (2/33), stable. Aortocaval lymph node has decreased minimally in size in the interval, now measuring 10 mm (2/35), previously 12 mm. Right  external iliac lymph node has also decreased minimally in size, now 9 mm (2/58), previously 14 mm. 10 mm lymph node in the sigmoid mesocolon (2/65), stable. Reproductive: Hysterectomy. Left adnexal heterogeneous cystic and solid mass is stable in size, 3.0 x 3.4 cm (2/54). Other: No free fluid.  Small bilateral inguinal hernias contain fat. Musculoskeletal: Degenerative changes in the spine. No worrisome lytic or sclerotic lesions. Dextroconvex scoliosis. IMPRESSION: 1. Slight response to therapy as evidenced by decrease in size of aortocaval and right external iliac lymph nodes. Left adnexal metastasis, adjacent lymph node in the sigmoid mesocolon and other abdominopelvic retroperitoneal lymph nodes are stable. 2.  Aortic atherosclerosis (ICD10-I70.0). Electronically Signed   By: Leanna Battles M.D.   On: 12/07/2022 09:37   ECHOCARDIOGRAM COMPLETE  Result Date: 12/05/2022    ECHOCARDIOGRAM REPORT   Patient Name:   Kayla Mcgrath Date of Exam: 12/05/2022 Medical Rec #:  [id number]      Height:       59.5 in Accession #:    [id number]     Weight:       142.2 lb Date of Birth:  Sep 03, 1945      BSA:          1.605 m Patient Age:    78 years       BP:           153/72 mmHg Patient Gender: F              HR:           67 bpm. Exam Location:  Outpatient Procedure: 2D Echo, 3D Echo, Color Doppler, Cardiac Doppler and Strain Analysis Indications:    Z51.11 Encounter for antineoplastic chemotheraphy  History:        Patient has prior history of Echocardiogram examinations, most                 recent 07/27/2022. Signs/Symptoms:Bacteremia; Risk                 Factors:Dyslipidemia. Breast cancer.  Sonographer:    Sheralyn Boatman RDCS Referring Phys: [id number] Keren Alverio IMPRESSIONS  1. Left ventricular ejection fraction, by estimation, is 60 to 65%. Left ventricular ejection fraction by 3D volume is 64 %. The left ventricle has normal function. The left ventricle has no regional wall motion abnormalities. Left ventricular  diastolic  parameters were normal. The average left ventricular global longitudinal strain is -22.6 %. The global longitudinal strain is normal.  2. Right ventricular systolic function is normal. The right ventricular size is normal. Tricuspid regurgitation signal is inadequate for assessing PA pressure.  3. The mitral valve is normal in structure. No evidence of mitral valve regurgitation. No evidence of mitral stenosis.  4. The aortic valve is tricuspid. Aortic valve regurgitation is not visualized. No aortic stenosis is present.  5. The inferior vena cava is normal in size with greater than 50% respiratory variability, suggesting right atrial pressure of 3 mmHg. FINDINGS  Left Ventricle: Left ventricular ejection fraction, by estimation, is 60 to 65%. Left ventricular ejection fraction by 3D volume is 64 %. The left ventricle has normal function. The left ventricle has no regional wall motion abnormalities. The average left ventricular global longitudinal strain is -22.6 %. The global longitudinal strain is normal. The left ventricular internal cavity size was normal in size. There is no left ventricular hypertrophy. Left ventricular diastolic parameters were normal. Right Ventricle: The right ventricular size is normal. No increase in right ventricular wall thickness. Right ventricular systolic function is normal. Tricuspid regurgitation signal is inadequate for assessing PA pressure. Left Atrium: Left atrial size was normal in size. Right Atrium: Right atrial size was normal in size. Pericardium: There is no evidence of pericardial effusion. Mitral Valve: The mitral valve is normal in structure. No evidence of mitral valve regurgitation. No evidence of mitral valve stenosis. Tricuspid Valve: The tricuspid valve is normal in structure. Tricuspid valve regurgitation is trivial. Aortic Valve: The aortic valve is tricuspid. Aortic valve regurgitation is not visualized. No aortic stenosis is present. Pulmonic Valve:  The pulmonic valve was not well visualized. Pulmonic valve regurgitation is not visualized. Aorta: The aortic root and ascending aorta are structurally normal, with no evidence of dilitation. Venous: The inferior vena cava is normal in size with greater than 50% respiratory variability, suggesting right atrial pressure of 3 mmHg. IAS/Shunts: The interatrial septum was not well visualized.  LEFT VENTRICLE PLAX 2D LVIDd:         4.20 cm         Diastology LVIDs:         2.80 cm         LV e' medial:    7.51 cm/s LV PW:         0.90 cm         LV E/e' medial:  12.7 LV IVS:        0.80 cm         LV e' lateral:   9.68 cm/s LVOT diam:     2.30 cm         LV E/e' lateral: 9.8 LV SV:         88 LV SV Index:   55              2D LVOT Area:     4.15 cm        Longitudinal                                Strain                                2D Strain GLS  -22.6 % LV Volumes (MOD)               Avg: LV vol d, MOD    71.9 ml A2C:                           3D Volume EF LV vol d, MOD    76.0 ml       LV 3D  EF:    Left A4C:                                        ventricul LV vol s, MOD    25.3 ml                    ar A2C:                                        ejection LV vol s, MOD    29.1 ml                    fraction A4C:                                        by 3D LV SV MOD A2C:   46.6 ml                    volume is LV SV MOD A4C:   76.0 ml                    64 %. LV SV MOD BP:    51.5 ml                                 3D Volume EF:                                3D EF:        64 %                                LV EDV:       124 ml                                LV ESV:       45 ml                                LV SV:        79 ml RIGHT VENTRICLE             IVC RV S prime:     12.20 cm/s  IVC diam: 1.70 cm TAPSE (M-mode): 2.3 cm LEFT ATRIUM             Index        RIGHT ATRIUM           Index LA diam:        2.70 cm 1.68 cm/m   RA Area:     11.10 cm LA Vol (A2C):   54.4 ml 33.90 ml/m  RA Volume:   22.90 ml  14.27  ml/m LA Vol (A4C):   19.3 ml 12.03 ml/m LA Biplane Vol: 35.8 ml 22.31 ml/m  AORTIC VALVE LVOT Vmax:   108.00 cm/s LVOT Vmean:  67.800 cm/s LVOT VTI:  0.212 m  AORTA Ao Root diam: 3.10 cm Ao Asc diam:  3.10 cm MITRAL VALVE MV Area (PHT): 3.88 cm     SHUNTS MV Decel Time: 196 msec     Systemic VTI:  0.21 m MV E velocity: 95.15 cm/s   Systemic Diam: 2.30 cm MV A velocity: 108.00 cm/s MV E/A ratio:  0.88 Epifanio Lesches Kayla Mcgrath Electronically signed by Epifanio Lesches Kayla Mcgrath Signature Date/Time: 12/05/2022/6:06:56 PM    Final       01/19/2023 Tumor Marker   Patient's tumor was tested for the following markers: CA-125. Results of the tumor marker test revealed 8.4.   02/21/2023 Imaging   CT CHEST ABDOMEN PELVIS W CONTRAST Result Date: 02/28/2023 CLINICAL DATA:  Uterine sarcoma, lung nodules, ongoing immunotherapy, chemotherapy and XRT complete additional history of right breast cancer * Tracking Code: BO * EXAM: CT CHEST, ABDOMEN, AND PELVIS WITH CONTRAST TECHNIQUE: Multidetector CT imaging of the chest, abdomen and pelvis was performed following the standard protocol during bolus administration of intravenous contrast. RADIATION DOSE REDUCTION: This exam was performed according to the departmental dose-optimization program which includes automated exposure control, adjustment of the mA and/or kV according to patient size and/or use of iterative reconstruction technique. CONTRAST:  OMNIPAQUE IOHEXOL 300 MG/ML  SOLN COMPARISON:  CT abdomen pelvis, 11/29/2022, CT chest abdomen pelvis, 09/08/2022 FINDINGS: CT CHEST FINDINGS Cardiovascular: Right chest port catheter. Normal heart size. Left coronary artery calcifications. No pericardial effusion. Mediastinum/Nodes: No enlarged mediastinal, hilar, or axillary lymph nodes. Thyroid gland, trachea, and esophagus demonstrate no significant findings. Lungs/Pleura: Unchanged nodule of the posterior medial left upper lobe measuring 0.8 x 0.6 cm (series 6, image  41). No pleural effusion or pneumothorax. Musculoskeletal: Status post right mastectomy and axillary lymph node dissection with implant reconstruction. No acute osseous findings. CT ABDOMEN PELVIS FINDINGS Hepatobiliary: No solid liver abnormality is seen. No gallstones, gallbladder wall thickening, or biliary dilatation. Pancreas: Unremarkable. No pancreatic ductal dilatation or surrounding inflammatory changes. Spleen: Normal in size without significant abnormality. Adrenals/Urinary Tract: Adrenal glands are unremarkable. Kidneys are normal, without renal calculi, solid lesion, or hydronephrosis. Bladder is unremarkable. Stomach/Bowel: Stomach is within normal limits. Appendix not clearly visualized. No evidence of bowel wall thickening, distention, or inflammatory changes. Descending and sigmoid diverticulosis. Vascular/Lymphatic: Scattered aortic atherosclerosis. Unchanged sigmoid mesocolon lymph node measuring 1.3 x 1.1 cm (series 2, image 90). Unchanged right external iliac lymph node measuring 1.1 x 0.8 cm (series 2, image 73). Unchanged retroperitoneal lymph nodes, left retroperitoneal lymph node measuring 1.5 x 1.1 cm (series 2, image 68). Reproductive: Status post hysterectomy. Diminished size of a mixed solid and cystic left iliac lymph node or adnexal metastasis, measuring 2.3 x 1.5 cm, previously 3.4 x 3.0 cm (series 2, image 90). Other: No abdominal wall hernia or abnormality. Trace free fluid in the low pelvis. Musculoskeletal: No acute osseous findings. S shaped scoliosis of the thoracolumbar spine. IMPRESSION: 1. Diminished size of a mixed solid and cystic left iliac lymph node or adnexal metastasis, measuring 2.3 x 1.5 cm, previously 3.4 x 3.0 cm consistent with treatment response. 2. Unchanged sigmoid mesocolon, right external iliac, and retroperitoneal lymph nodes. 3. Unchanged pulmonary nodule of the posterior medial left upper lobe. 4. Status post hysterectomy and right mastectomy. 5. Coronary  artery disease. Electronically Signed   By: Jearld Lesch M.D.   On: 02/28/2023 09:34        PHYSICAL EXAMINATION: ECOG PERFORMANCE STATUS: 1 - Symptomatic but completely ambulatory  Vitals:   02/28/23 0955  BP: 136/78  Pulse: 85  Resp: 18  Temp: 98.5 F (36.9 C)  SpO2: 99%   Filed Weights   02/28/23 0955  Weight: 140 lb 9.6 oz (63.8 kg)    GENERAL:alert, no distress and comfortable LABORATORY DATA:  I have reviewed the data as listed    Component Value Date/Time   NA 139 02/28/2023 0927   NA 140 11/02/2016 1130   K 3.3 (L) 02/28/2023 0927   K 3.8 11/02/2016 1130   CL 106 02/28/2023 0927   CL 105 05/02/2012 0822   CO2 26 02/28/2023 0927   CO2 26 11/02/2016 1130   GLUCOSE 98 02/28/2023 0927   GLUCOSE 87 11/02/2016 1130   GLUCOSE 98 05/02/2012 0822   BUN 22 02/28/2023 0927   BUN 14.7 11/02/2016 1130   CREATININE 0.95 02/28/2023 0927   CREATININE 0.84 12/05/2018 1014   CREATININE 0.8 11/02/2016 1130   CALCIUM 9.0 02/28/2023 0927   CALCIUM 9.7 11/02/2016 1130   PROT 6.3 (L) 02/28/2023 0927   PROT 6.8 11/02/2016 1130   ALBUMIN 3.5 02/28/2023 0927   ALBUMIN 3.6 11/02/2016 1130   AST 31 02/28/2023 0927   AST 23 11/02/2016 1130   ALT 22 02/28/2023 0927   ALT 17 11/02/2016 1130   ALKPHOS 126 02/28/2023 0927   ALKPHOS 59 11/02/2016 1130   BILITOT 0.3 02/28/2023 0927   BILITOT 0.34 11/02/2016 1130   GFRNONAA >60 02/28/2023 0927   GFRAA >60 11/08/2017 0504   GFRAA >60 07/25/2017 1425    No results found for: "SPEP", "UPEP"  Lab Results  Component Value Date   WBC 4.1 02/28/2023   NEUTROABS 2.5 02/28/2023   HGB 10.9 (L) 02/28/2023   HCT 32.8 (L) 02/28/2023   MCV 99.4 02/28/2023   PLT 200 02/28/2023      Chemistry      Component Value Date/Time   NA 139 02/28/2023 0927   NA 140 11/02/2016 1130   K 3.3 (L) 02/28/2023 0927   K 3.8 11/02/2016 1130   CL 106 02/28/2023 0927   CL 105 05/02/2012 0822   CO2 26 02/28/2023 0927   CO2 26 11/02/2016 1130    BUN 22 02/28/2023 0927   BUN 14.7 11/02/2016 1130   CREATININE 0.95 02/28/2023 0927   CREATININE 0.84 12/05/2018 1014   CREATININE 0.8 11/02/2016 1130      Component Value Date/Time   CALCIUM 9.0 02/28/2023 0927   CALCIUM 9.7 11/02/2016 1130   ALKPHOS 126 02/28/2023 0927   ALKPHOS 59 11/02/2016 1130   AST 31 02/28/2023 0927   AST 23 11/02/2016 1130   ALT 22 02/28/2023 0927   ALT 17 11/02/2016 1130   BILITOT 0.3 02/28/2023 0927   BILITOT 0.34 11/02/2016 1130       RADIOGRAPHIC STUDIES: I have reviewed multiple imaging studies with the patient I have personally reviewed the radiological images as listed and agreed with the findings in the report. CT CHEST ABDOMEN PELVIS W CONTRAST Result Date: 02/28/2023 CLINICAL DATA:  Uterine sarcoma, lung nodules, ongoing immunotherapy, chemotherapy and XRT complete additional history of right breast cancer * Tracking Code: BO * EXAM: CT CHEST, ABDOMEN, AND PELVIS WITH CONTRAST TECHNIQUE: Multidetector CT imaging of the chest, abdomen and pelvis was performed following the standard protocol during bolus administration of intravenous contrast. RADIATION DOSE REDUCTION: This exam was performed according to the departmental dose-optimization program which includes automated exposure control, adjustment of the mA and/or kV according to patient size and/or use of iterative reconstruction technique. CONTRAST:  OMNIPAQUE IOHEXOL 300 MG/ML  SOLN COMPARISON:  CT abdomen pelvis, 11/29/2022, CT chest abdomen pelvis, 09/08/2022 FINDINGS: CT CHEST FINDINGS Cardiovascular: Right chest port catheter. Normal heart size. Left coronary artery calcifications. No pericardial effusion. Mediastinum/Nodes: No enlarged mediastinal, hilar, or axillary lymph nodes. Thyroid gland, trachea, and esophagus demonstrate no significant findings. Lungs/Pleura: Unchanged nodule of the posterior medial left upper lobe measuring 0.8 x 0.6 cm (series 6, image 41). No pleural effusion or  pneumothorax. Musculoskeletal: Status post right mastectomy and axillary lymph node dissection with implant reconstruction. No acute osseous findings. CT ABDOMEN PELVIS FINDINGS Hepatobiliary: No solid liver abnormality is seen. No gallstones, gallbladder wall thickening, or biliary dilatation. Pancreas: Unremarkable. No pancreatic ductal dilatation or surrounding inflammatory changes. Spleen: Normal in size without significant abnormality. Adrenals/Urinary Tract: Adrenal glands are unremarkable. Kidneys are normal, without renal calculi, solid lesion, or hydronephrosis. Bladder is unremarkable. Stomach/Bowel: Stomach is within normal limits. Appendix not clearly visualized. No evidence of bowel wall thickening, distention, or inflammatory changes. Descending and sigmoid diverticulosis. Vascular/Lymphatic: Scattered aortic atherosclerosis. Unchanged sigmoid mesocolon lymph node measuring 1.3 x 1.1 cm (series 2, image 90). Unchanged right external iliac lymph node measuring 1.1 x 0.8 cm (series 2, image 73). Unchanged retroperitoneal lymph nodes, left retroperitoneal lymph node measuring 1.5 x 1.1 cm (series 2, image 68). Reproductive: Status post hysterectomy. Diminished size of a mixed solid and cystic left iliac lymph node or adnexal metastasis, measuring 2.3 x 1.5 cm, previously 3.4 x 3.0 cm (series 2, image 90). Other: No abdominal wall hernia or abnormality. Trace free fluid in the low pelvis. Musculoskeletal: No acute osseous findings. S shaped scoliosis of the thoracolumbar spine. IMPRESSION: 1. Diminished size of a mixed solid and cystic left iliac lymph node or adnexal metastasis, measuring 2.3 x 1.5 cm, previously 3.4 x 3.0 cm consistent with treatment response. 2. Unchanged sigmoid mesocolon, right external iliac, and retroperitoneal lymph nodes. 3. Unchanged pulmonary nodule of the posterior medial left upper lobe. 4. Status post hysterectomy and right mastectomy. 5. Coronary artery disease.  Electronically Signed   By: Jearld Lesch M.D.   On: 02/28/2023 09:34

## 2023-03-01 ENCOUNTER — Other Ambulatory Visit: Payer: Self-pay

## 2023-03-01 LAB — CA 125: Cancer Antigen (CA) 125: 6.5 U/mL (ref 0.0–38.1)

## 2023-03-10 ENCOUNTER — Ambulatory Visit (HOSPITAL_COMMUNITY): Payer: Medicare HMO

## 2023-03-10 ENCOUNTER — Other Ambulatory Visit: Payer: Self-pay

## 2023-03-11 ENCOUNTER — Other Ambulatory Visit: Payer: Self-pay | Admitting: Hematology and Oncology

## 2023-03-13 ENCOUNTER — Encounter: Payer: Self-pay | Admitting: Hematology and Oncology

## 2023-03-20 ENCOUNTER — Ambulatory Visit (HOSPITAL_COMMUNITY)
Admission: RE | Admit: 2023-03-20 | Discharge: 2023-03-20 | Disposition: A | Discharge status: Home / Self Care | Payer: Medicare HMO | Source: Ambulatory Visit | Attending: Hematology and Oncology | Admitting: Hematology and Oncology

## 2023-03-20 DIAGNOSIS — C549 Malignant neoplasm of corpus uteri, unspecified: Secondary | ICD-10-CM

## 2023-03-20 DIAGNOSIS — Z0189 Encounter for other specified special examinations: Secondary | ICD-10-CM | POA: Diagnosis not present

## 2023-03-20 LAB — ECHOCARDIOGRAM COMPLETE
Area-P 1/2: 3.4 cm2
Calc EF: 64.4 %
S' Lateral: 2.6 cm
Single Plane A2C EF: 66.2 %
Single Plane A4C EF: 58.5 %

## 2023-03-20 MED FILL — Fosaprepitant Dimeglumine For IV Infusion 150 MG (Base Eq): INTRAVENOUS | Qty: 5 | Status: AC

## 2023-03-21 ENCOUNTER — Inpatient Hospital Stay: Payer: Medicare HMO | Admitting: Hematology and Oncology

## 2023-03-21 ENCOUNTER — Inpatient Hospital Stay: Payer: Medicare HMO | Attending: Hematology and Oncology

## 2023-03-21 ENCOUNTER — Encounter: Payer: Self-pay | Admitting: Hematology and Oncology

## 2023-03-21 ENCOUNTER — Inpatient Hospital Stay: Payer: Medicare HMO

## 2023-03-21 VITALS — BP 155/63 | HR 62 | Temp 98.4°F | Resp 18 | Ht 59.5 in | Wt 137.2 lb

## 2023-03-21 DIAGNOSIS — R197 Diarrhea, unspecified: Secondary | ICD-10-CM | POA: Diagnosis not present

## 2023-03-21 DIAGNOSIS — C549 Malignant neoplasm of corpus uteri, unspecified: Secondary | ICD-10-CM | POA: Insufficient documentation

## 2023-03-21 DIAGNOSIS — D61818 Other pancytopenia: Secondary | ICD-10-CM | POA: Insufficient documentation

## 2023-03-21 DIAGNOSIS — Z5112 Encounter for antineoplastic immunotherapy: Secondary | ICD-10-CM | POA: Insufficient documentation

## 2023-03-21 DIAGNOSIS — Z171 Estrogen receptor negative status [ER-]: Secondary | ICD-10-CM | POA: Diagnosis not present

## 2023-03-21 DIAGNOSIS — Z1731 Human epidermal growth factor receptor 2 positive status: Secondary | ICD-10-CM | POA: Insufficient documentation

## 2023-03-21 LAB — CMP (CANCER CENTER ONLY)
ALT: 23 U/L (ref 0–44)
AST: 31 U/L (ref 15–41)
Albumin: 3.6 g/dL (ref 3.5–5.0)
Alkaline Phosphatase: 131 U/L — ABNORMAL HIGH (ref 38–126)
Anion gap: 6 (ref 5–15)
BUN: 19 mg/dL (ref 8–23)
CO2: 27 mmol/L (ref 22–32)
Calcium: 8.6 mg/dL — ABNORMAL LOW (ref 8.9–10.3)
Chloride: 107 mmol/L (ref 98–111)
Creatinine: 0.9 mg/dL (ref 0.44–1.00)
GFR, Estimated: >60 mL/min (ref >60)
Glucose, Bld: 96 mg/dL (ref 70–99)
Potassium: 3.2 mmol/L — ABNORMAL LOW (ref 3.5–5.1)
Sodium: 140 mmol/L (ref 135–145)
Total Bilirubin: 0.3 mg/dL (ref 0.0–1.2)
Total Protein: 6.5 g/dL (ref 6.5–8.1)

## 2023-03-21 LAB — CBC WITH DIFFERENTIAL (CANCER CENTER ONLY)
Abs Immature Granulocytes: 0.01 10*3/uL (ref 0.00–0.07)
Basophils Absolute: 0 10*3/uL (ref 0.0–0.1)
Basophils Relative: 1 %
Eosinophils Absolute: 0.1 10*3/uL (ref 0.0–0.5)
Eosinophils Relative: 2 %
HCT: 31.8 % — ABNORMAL LOW (ref 36.0–46.0)
Hemoglobin: 10.6 g/dL — ABNORMAL LOW (ref 12.0–15.0)
Immature Granulocytes: 0 %
Lymphocytes Relative: 26 %
Lymphs Abs: 0.9 10*3/uL (ref 0.7–4.0)
MCH: 33 pg (ref 26.0–34.0)
MCHC: 33.3 g/dL (ref 30.0–36.0)
MCV: 99.1 fL (ref 80.0–100.0)
Monocytes Absolute: 0.6 10*3/uL (ref 0.1–1.0)
Monocytes Relative: 17 %
Neutro Abs: 2 10*3/uL (ref 1.7–7.7)
Neutrophils Relative %: 54 %
Platelet Count: 187 10*3/uL (ref 150–400)
RBC: 3.21 MIL/uL — ABNORMAL LOW (ref 3.87–5.11)
RDW: 16.9 % — ABNORMAL HIGH (ref 11.5–15.5)
WBC Count: 3.6 10*3/uL — ABNORMAL LOW (ref 4.0–10.5)
nRBC: 0 % (ref 0.0–0.2)

## 2023-03-21 MED ORDER — HEPARIN SOD (PORK) LOCK FLUSH 100 UNIT/ML IV SOLN
500.0000 [IU] | Freq: Once | INTRAVENOUS | Status: AC | PRN
  Administered 2023-03-21: 500 [IU]

## 2023-03-21 MED ORDER — FAM-TRASTUZUMAB DERUXTECAN-NXKI CHEMO 100 MG IV SOLR
5.4000 mg/kg | Freq: Once | INTRAVENOUS | Status: AC
  Administered 2023-03-21: 340 mg via INTRAVENOUS
  Filled 2023-03-21: qty 17

## 2023-03-21 MED ORDER — ESCITALOPRAM OXALATE 10 MG PO TABS: 10.0000 mg | ORAL_TABLET | Freq: Every day | ORAL | 0 refills | Status: DC

## 2023-03-21 MED ORDER — PALONOSETRON HCL INJECTION 0.25 MG/5ML
0.2500 mg | Freq: Once | INTRAVENOUS | Status: AC
  Administered 2023-03-21: 0.25 mg via INTRAVENOUS
  Filled 2023-03-21: qty 5

## 2023-03-21 MED ORDER — SODIUM CHLORIDE 0.9 % IV SOLN
150.0000 mg | Freq: Once | INTRAVENOUS | Status: AC
  Administered 2023-03-21: 150 mg via INTRAVENOUS
  Filled 2023-03-21: qty 150

## 2023-03-21 MED ORDER — DEXTROSE 5 % IV SOLN
Freq: Once | INTRAVENOUS | Status: AC
Start: 2023-03-21 — End: 2023-03-21

## 2023-03-21 MED ORDER — SODIUM CHLORIDE 0.9% FLUSH
10.0000 mL | INTRAVENOUS | Status: DC | PRN
  Administered 2023-03-21: 10 mL

## 2023-03-21 MED ORDER — DEXAMETHASONE SODIUM PHOSPHATE 10 MG/ML IJ SOLN
10.0000 mg | Freq: Once | INTRAMUSCULAR | Status: AC
  Administered 2023-03-21: 10 mg via INTRAVENOUS
  Filled 2023-03-21: qty 1

## 2023-03-21 MED ORDER — SODIUM CHLORIDE 0.9% FLUSH
10.0000 mL | Freq: Once | INTRAVENOUS | Status: AC
  Administered 2023-03-21: 10 mL

## 2023-03-21 NOTE — Patient Instructions (Signed)

## 2023-03-21 NOTE — Progress Notes (Signed)
 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  She underwent hysterectomy with tumor debulking but lymph nodes were not resectable Final pathology reviewed carcinosarcoma: MMR normal, MSI stable, PD-L1 CPS: 1%, Her2 positive, P53 + Neg genetics, ER negative She was treated with combination of carboplatin, paclitaxel and trastuzumab, completed by August 2024 with disease progression Her treatment was subsequently changed to trastuzumab Deruxtecan with positive response to therapy  Overall, she tolerated treatment well without major side effects apart from mild intermittent anemia Review of her echocardiogram from March 20, 2023 show preserved ejection fraction I plan to repeat imaging study in May 2025 She will continue treatment indefinitely Her next echocardiogram will be due in June 2025 Pancytopenia, acquired [facility]) She has stable pancytopenia due to treatment She is not symptomatic We will proceed with treatment without delay Diarrhea, unspecified type Suspect this is due to magnesium supplement After discontinuation of magnesium supplement, her diarrhea has improved Observe only  No orders of the defined types were placed in this encounter.    Artis Delay, MD  INTERVAL HISTORY: she returns for treatment follow-up Complications related to previous cycle of chemotherapy included pancytopenia, and diarrhea, She denies abdominal pain or changes in bowel habits except for occasional loose stool She is not symptomatic from pancytopenia  PHYSICAL EXAMINATION: ECOG PERFORMANCE STATUS: 1 - Symptomatic but completely ambulatory  Vitals:   03/21/23  1225  BP: (!) 155/63  Pulse: 62  Resp: 18  Temp: 98.4 F (36.9 C)  SpO2: 100%   Filed Weights   03/21/23 1225  Weight: 137 lb 3.2 oz (62.2 kg)    Relevant data reviewed during this visit included CBC and CMP.  I reviewed echocardiogram report with the patient

## 2023-03-21 NOTE — Assessment & Plan Note (Addendum)
 She initially presented with postmenopausal bleeding in September 2023, biopsy reviewed high-grade malignancy with sarcomatoid features.  She underwent hysterectomy with tumor debulking but lymph nodes were not resectable Final pathology reviewed carcinosarcoma: MMR normal, MSI stable, PD-L1 CPS: 1%, Her2 positive, P53 + Neg genetics, ER negative She was treated with combination of carboplatin, paclitaxel and trastuzumab, completed by August 2024 with disease progression Her treatment was subsequently changed to trastuzumab Deruxtecan with positive response to therapy  Overall, she tolerated treatment well without major side effects apart from mild intermittent anemia Review of her echocardiogram from March 20, 2023 show preserved ejection fraction I plan to repeat imaging study in May 2025 She will continue treatment indefinitely Her next echocardiogram will be due in June 2025

## 2023-03-21 NOTE — Assessment & Plan Note (Addendum)
 She has stable pancytopenia due to treatment She is not symptomatic We will proceed with treatment without delay

## 2023-03-21 NOTE — Assessment & Plan Note (Addendum)
 Suspect this is due to magnesium supplement After discontinuation of magnesium supplement, her diarrhea has improved Observe only

## 2023-03-22 LAB — CA 125: Cancer Antigen (CA) 125: 8.3 U/mL (ref 0.0–38.1)

## 2023-03-31 ENCOUNTER — Other Ambulatory Visit: Payer: Self-pay | Admitting: Hematology and Oncology

## 2023-03-31 ENCOUNTER — Telehealth: Payer: Self-pay

## 2023-03-31 DIAGNOSIS — F411 Generalized anxiety disorder: Secondary | ICD-10-CM

## 2023-03-31 NOTE — Telephone Encounter (Signed)
 Pls get PCP to refill

## 2023-03-31 NOTE — Telephone Encounter (Signed)
 I left her a message to contact PCP.

## 2023-03-31 NOTE — Telephone Encounter (Signed)
 Called and left a message per Dr. Bertis Ruddy to contact PCP for refill of Lexapro if needed.

## 2023-04-10 MED FILL — Fosaprepitant Dimeglumine For IV Infusion 150 MG (Base Eq): INTRAVENOUS | Qty: 5 | Status: AC

## 2023-04-11 ENCOUNTER — Inpatient Hospital Stay (HOSPITAL_BASED_OUTPATIENT_CLINIC_OR_DEPARTMENT_OTHER): Payer: Medicare HMO | Admitting: Hematology and Oncology

## 2023-04-11 ENCOUNTER — Inpatient Hospital Stay: Payer: Medicare HMO | Attending: Hematology and Oncology

## 2023-04-11 ENCOUNTER — Inpatient Hospital Stay: Payer: Medicare HMO

## 2023-04-11 ENCOUNTER — Encounter: Payer: Self-pay | Admitting: Hematology and Oncology

## 2023-04-11 VITALS — BP 118/63 | HR 93 | Temp 97.8°F | Resp 18 | Ht 59.5 in | Wt 136.2 lb

## 2023-04-11 DIAGNOSIS — R197 Diarrhea, unspecified: Secondary | ICD-10-CM | POA: Diagnosis not present

## 2023-04-11 DIAGNOSIS — C549 Malignant neoplasm of corpus uteri, unspecified: Secondary | ICD-10-CM | POA: Insufficient documentation

## 2023-04-11 DIAGNOSIS — Z5112 Encounter for antineoplastic immunotherapy: Secondary | ICD-10-CM | POA: Insufficient documentation

## 2023-04-11 DIAGNOSIS — Z171 Estrogen receptor negative status [ER-]: Secondary | ICD-10-CM | POA: Insufficient documentation

## 2023-04-11 DIAGNOSIS — D61818 Other pancytopenia: Secondary | ICD-10-CM | POA: Diagnosis not present

## 2023-04-11 DIAGNOSIS — Z1731 Human epidermal growth factor receptor 2 positive status: Secondary | ICD-10-CM | POA: Insufficient documentation

## 2023-04-11 LAB — CMP (CANCER CENTER ONLY)
ALT: 23 U/L (ref 0–44)
AST: 35 U/L (ref 15–41)
Albumin: 3.7 g/dL (ref 3.5–5.0)
Alkaline Phosphatase: 131 U/L — ABNORMAL HIGH (ref 38–126)
Anion gap: 9 (ref 5–15)
BUN: 24 mg/dL — ABNORMAL HIGH (ref 8–23)
CO2: 24 mmol/L (ref 22–32)
Calcium: 9 mg/dL (ref 8.9–10.3)
Chloride: 105 mmol/L (ref 98–111)
Creatinine: 1.15 mg/dL — ABNORMAL HIGH (ref 0.44–1.00)
GFR, Estimated: 49 mL/min — ABNORMAL LOW (ref >60)
Glucose, Bld: 96 mg/dL (ref 70–99)
Potassium: 3.7 mmol/L (ref 3.5–5.1)
Sodium: 138 mmol/L (ref 135–145)
Total Bilirubin: 0.4 mg/dL (ref 0.0–1.2)
Total Protein: 6.5 g/dL (ref 6.5–8.1)

## 2023-04-11 LAB — CBC WITH DIFFERENTIAL (CANCER CENTER ONLY)
Abs Immature Granulocytes: 0.01 10*3/uL (ref 0.00–0.07)
Basophils Absolute: 0 10*3/uL (ref 0.0–0.1)
Basophils Relative: 1 %
Eosinophils Absolute: 0.1 10*3/uL (ref 0.0–0.5)
Eosinophils Relative: 3 %
HCT: 32.7 % — ABNORMAL LOW (ref 36.0–46.0)
Hemoglobin: 11 g/dL — ABNORMAL LOW (ref 12.0–15.0)
Immature Granulocytes: 0 %
Lymphocytes Relative: 25 %
Lymphs Abs: 0.9 10*3/uL (ref 0.7–4.0)
MCH: 33.8 pg (ref 26.0–34.0)
MCHC: 33.6 g/dL (ref 30.0–36.0)
MCV: 100.6 fL — ABNORMAL HIGH (ref 80.0–100.0)
Monocytes Absolute: 0.6 10*3/uL (ref 0.1–1.0)
Monocytes Relative: 18 %
Neutro Abs: 1.8 10*3/uL (ref 1.7–7.7)
Neutrophils Relative %: 53 %
Platelet Count: 188 10*3/uL (ref 150–400)
RBC: 3.25 MIL/uL — ABNORMAL LOW (ref 3.87–5.11)
RDW: 16.5 % — ABNORMAL HIGH (ref 11.5–15.5)
WBC Count: 3.4 10*3/uL — ABNORMAL LOW (ref 4.0–10.5)
nRBC: 0 % (ref 0.0–0.2)

## 2023-04-11 MED ORDER — SODIUM CHLORIDE 0.9% FLUSH
10.0000 mL | Freq: Once | INTRAVENOUS | Status: AC
  Administered 2023-04-11: 10 mL

## 2023-04-11 MED ORDER — DEXAMETHASONE SODIUM PHOSPHATE 10 MG/ML IJ SOLN
10.0000 mg | Freq: Once | INTRAMUSCULAR | Status: AC
  Administered 2023-04-11: 10 mg via INTRAVENOUS
  Filled 2023-04-11: qty 1

## 2023-04-11 MED ORDER — FAM-TRASTUZUMAB DERUXTECAN-NXKI CHEMO 100 MG IV SOLR
5.4000 mg/kg | Freq: Once | INTRAVENOUS | Status: AC
  Administered 2023-04-11: 340 mg via INTRAVENOUS
  Filled 2023-04-11: qty 17

## 2023-04-11 MED ORDER — DEXTROSE 5 % IV SOLN: Freq: Once | INTRAVENOUS | Status: AC

## 2023-04-11 MED ORDER — HEPARIN SOD (PORK) LOCK FLUSH 100 UNIT/ML IV SOLN: 500.0000 [IU] | Freq: Once | INTRAVENOUS | Status: DC | PRN

## 2023-04-11 MED ORDER — SODIUM CHLORIDE 0.9% FLUSH
10.0000 mL | INTRAVENOUS | Status: DC | PRN
Start: 2023-04-11 — End: 2023-04-11

## 2023-04-11 MED ORDER — SODIUM CHLORIDE 0.9 % IV SOLN
150.0000 mg | Freq: Once | INTRAVENOUS | Status: AC
  Administered 2023-04-11: 150 mg via INTRAVENOUS
  Filled 2023-04-11: qty 150

## 2023-04-11 MED ORDER — PALONOSETRON HCL INJECTION 0.25 MG/5ML
0.2500 mg | Freq: Once | INTRAVENOUS | Status: AC
  Administered 2023-04-11: 0.25 mg via INTRAVENOUS
  Filled 2023-04-11: qty 5

## 2023-04-11 NOTE — Assessment & Plan Note (Addendum)
 She has intermittent diarrhea but they are still limiting Observe only

## 2023-04-11 NOTE — Progress Notes (Signed)
 [facility] OFFICE PROGRESS NOTE  Patient Care Team: Artis Delay, MD as PCP - General (Hematology and Oncology) Hannah Beat, MD as Consulting Physician [facility] Medicine)  Assessment & Plan Carcinosarcoma of body of uterus [facility]) She initially presented with postmenopausal bleeding in September 2023, biopsy reviewed high-grade malignancy with sarcomatoid features.  She underwent hysterectomy with tumor debulking but lymph nodes were not resectable Final pathology reviewed carcinosarcoma: MMR normal, MSI stable, PD-L1 CPS: 1%, Her2 positive, P53 + Neg genetics, ER negative She was treated with combination of carboplatin, paclitaxel and trastuzumab, completed by August 2024 with disease progression Her treatment was subsequently changed to trastuzumab Deruxtecan with positive response to therapy  Overall, she tolerated treatment well without major side effects apart from mild intermittent anemia I plan to repeat imaging study in May 2025 She will continue treatment indefinitely Her next echocardiogram will be due in June 2025 Pancytopenia, acquired [facility] She has stable pancytopenia due to treatment She is not symptomatic We will proceed with treatment without delay Diarrhea, unspecified type She has intermittent diarrhea but they are still limiting Observe only  Orders Placed This Encounter  Procedures   CBC with Differential (Cancer Center Only)    Standing Status:   Future    Expected Date:   05/02/2023    Expiration Date:   05/01/2024   CMP (Cancer Center only)    Standing Status:   Future    Expected Date:   05/02/2023    Expiration Date:   05/01/2024   CBC with Differential (Cancer Center Only)    Standing Status:   Future    Expected Date:   05/23/2023    Expiration Date:   05/22/2024   CMP (Cancer Center only)    Standing Status:   Future    Expected Date:   05/23/2023    Expiration Date:   05/22/2024     Artis Delay, MD  INTERVAL HISTORY: she returns  for treatment follow-up Complications related to previous cycle of chemotherapy included pancytopenia, and diarrhea,  PHYSICAL EXAMINATION: ECOG PERFORMANCE STATUS: 0 - Asymptomatic  Vitals:   04/11/23 0817  BP: 118/63  Pulse: 93  Resp: 18  Temp: 97.8 F (36.6 C)  SpO2: 98%   Filed Weights   04/11/23 0817  Weight: 136 lb 3.2 oz (61.8 kg)    Relevant data reviewed during this visit included CBC and CMP

## 2023-04-11 NOTE — Assessment & Plan Note (Addendum)
 She initially presented with postmenopausal bleeding in September 2023, biopsy reviewed high-grade malignancy with sarcomatoid features.  She underwent hysterectomy with tumor debulking but lymph nodes were not resectable Final pathology reviewed carcinosarcoma: MMR normal, MSI stable, PD-L1 CPS: 1%, Her2 positive, P53 + Neg genetics, ER negative She was treated with combination of carboplatin, paclitaxel and trastuzumab, completed by August 2024 with disease progression Her treatment was subsequently changed to trastuzumab Deruxtecan with positive response to therapy  Overall, she tolerated treatment well without major side effects apart from mild intermittent anemia I plan to repeat imaging study in May 2025 She will continue treatment indefinitely Her next echocardiogram will be due in June 2025

## 2023-04-11 NOTE — Patient Instructions (Signed)

## 2023-04-11 NOTE — Assessment & Plan Note (Addendum)
 She has stable pancytopenia due to treatment She is not symptomatic We will proceed with treatment without delay

## 2023-04-12 ENCOUNTER — Telehealth: Payer: Self-pay | Admitting: Hematology and Oncology

## 2023-04-12 NOTE — Telephone Encounter (Signed)
 Left patient a vm regarding upcoming appointment

## 2023-04-13 ENCOUNTER — Other Ambulatory Visit: Payer: Self-pay

## 2023-04-27 ENCOUNTER — Encounter: Payer: Self-pay | Admitting: Hematology and Oncology

## 2023-05-01 MED FILL — Fosaprepitant Dimeglumine For IV Infusion 150 MG (Base Eq): INTRAVENOUS | Qty: 5 | Status: AC

## 2023-05-02 ENCOUNTER — Encounter: Payer: Self-pay | Admitting: Hematology and Oncology

## 2023-05-02 ENCOUNTER — Inpatient Hospital Stay

## 2023-05-02 ENCOUNTER — Inpatient Hospital Stay: Admitting: Hematology and Oncology

## 2023-05-02 VITALS — BP 128/60 | HR 84 | Temp 98.6°F | Resp 18 | Ht 59.3 in | Wt 138.8 lb

## 2023-05-02 DIAGNOSIS — C549 Malignant neoplasm of corpus uteri, unspecified: Secondary | ICD-10-CM

## 2023-05-02 DIAGNOSIS — D61818 Other pancytopenia: Secondary | ICD-10-CM

## 2023-05-02 DIAGNOSIS — Z5112 Encounter for antineoplastic immunotherapy: Secondary | ICD-10-CM | POA: Diagnosis not present

## 2023-05-02 DIAGNOSIS — Z171 Estrogen receptor negative status [ER-]: Secondary | ICD-10-CM | POA: Diagnosis not present

## 2023-05-02 DIAGNOSIS — R197 Diarrhea, unspecified: Secondary | ICD-10-CM | POA: Diagnosis not present

## 2023-05-02 DIAGNOSIS — Z1731 Human epidermal growth factor receptor 2 positive status: Secondary | ICD-10-CM | POA: Diagnosis not present

## 2023-05-02 LAB — CMP (CANCER CENTER ONLY)
ALT: 32 U/L (ref 0–44)
AST: 45 U/L — ABNORMAL HIGH (ref 15–41)
Albumin: 3.6 g/dL (ref 3.5–5.0)
Alkaline Phosphatase: 157 U/L — ABNORMAL HIGH (ref 38–126)
Anion gap: 6 (ref 5–15)
BUN: 18 mg/dL (ref 8–23)
CO2: 26 mmol/L (ref 22–32)
Calcium: 8.7 mg/dL — ABNORMAL LOW (ref 8.9–10.3)
Chloride: 108 mmol/L (ref 98–111)
Creatinine: 1 mg/dL (ref 0.44–1.00)
GFR, Estimated: 58 mL/min — ABNORMAL LOW (ref >60)
Glucose, Bld: 104 mg/dL — ABNORMAL HIGH (ref 70–99)
Potassium: 3.6 mmol/L (ref 3.5–5.1)
Sodium: 140 mmol/L (ref 135–145)
Total Bilirubin: 0.4 mg/dL (ref 0.0–1.2)
Total Protein: 6.4 g/dL — ABNORMAL LOW (ref 6.5–8.1)

## 2023-05-02 LAB — CBC WITH DIFFERENTIAL (CANCER CENTER ONLY)
Abs Immature Granulocytes: 0.01 10*3/uL (ref 0.00–0.07)
Basophils Absolute: 0.1 10*3/uL (ref 0.0–0.1)
Basophils Relative: 2 %
Eosinophils Absolute: 0.1 10*3/uL (ref 0.0–0.5)
Eosinophils Relative: 5 %
HCT: 31.2 % — ABNORMAL LOW (ref 36.0–46.0)
Hemoglobin: 10.5 g/dL — ABNORMAL LOW (ref 12.0–15.0)
Immature Granulocytes: 0 %
Lymphocytes Relative: 23 %
Lymphs Abs: 0.6 10*3/uL — ABNORMAL LOW (ref 0.7–4.0)
MCH: 33.7 pg (ref 26.0–34.0)
MCHC: 33.7 g/dL (ref 30.0–36.0)
MCV: 100 fL (ref 80.0–100.0)
Monocytes Absolute: 0.4 10*3/uL (ref 0.1–1.0)
Monocytes Relative: 16 %
Neutro Abs: 1.5 10*3/uL — ABNORMAL LOW (ref 1.7–7.7)
Neutrophils Relative %: 54 %
Platelet Count: 187 10*3/uL (ref 150–400)
RBC: 3.12 MIL/uL — ABNORMAL LOW (ref 3.87–5.11)
RDW: 17 % — ABNORMAL HIGH (ref 11.5–15.5)
WBC Count: 2.8 10*3/uL — ABNORMAL LOW (ref 4.0–10.5)
nRBC: 0 % (ref 0.0–0.2)

## 2023-05-02 MED ORDER — SODIUM CHLORIDE 0.9% FLUSH
10.0000 mL | Freq: Once | INTRAVENOUS | Status: AC
  Administered 2023-05-02: 10 mL

## 2023-05-02 MED ORDER — DEXTROSE 5 % IV SOLN: Freq: Once | INTRAVENOUS | Status: AC

## 2023-05-02 MED ORDER — DEXAMETHASONE SODIUM PHOSPHATE 10 MG/ML IJ SOLN
10.0000 mg | Freq: Once | INTRAMUSCULAR | Status: AC
  Administered 2023-05-02: 10 mg via INTRAVENOUS
  Filled 2023-05-02: qty 1

## 2023-05-02 MED ORDER — SODIUM CHLORIDE 0.9 % IV SOLN
150.0000 mg | Freq: Once | INTRAVENOUS | Status: AC
  Administered 2023-05-02: 150 mg via INTRAVENOUS
  Filled 2023-05-02: qty 150

## 2023-05-02 MED ORDER — FAM-TRASTUZUMAB DERUXTECAN-NXKI CHEMO 100 MG IV SOLR
5.4000 mg/kg | Freq: Once | INTRAVENOUS | Status: AC
  Administered 2023-05-02: 340 mg via INTRAVENOUS
  Filled 2023-05-02: qty 17

## 2023-05-02 MED ORDER — PALONOSETRON HCL INJECTION 0.25 MG/5ML
0.2500 mg | Freq: Once | INTRAVENOUS | Status: AC
  Administered 2023-05-02: 0.25 mg via INTRAVENOUS
  Filled 2023-05-02: qty 5

## 2023-05-02 NOTE — Assessment & Plan Note (Addendum)
 She has stable pancytopenia due to treatment She is not symptomatic We will proceed with treatment without delay

## 2023-05-02 NOTE — Patient Instructions (Signed)
 [facility] - A DEPT OF [facility]  Discharge Instructions: Thank you for choosing [facility] to provide your oncology and hematology care.   If you have a lab appointment with the Cancer Center, please go directly to the Cancer Center and check in at the registration area.   Wear comfortable clothing and clothing appropriate for easy access to any Portacath or PICC line.   We strive to give you quality time with your provider. You may need to reschedule your appointment if you arrive late (15 or more minutes).  Arriving late affects you and other patients whose appointments are after yours.  Also, if you miss three or more appointments without notifying the office, you may be dismissed from the clinic at the provider's discretion.      For prescription refill requests, have your pharmacy contact our office and allow 72 hours for refills to be completed.    Today you received the following chemotherapy and/or immunotherapy agents enhertu      To help prevent nausea and vomiting after your treatment, we encourage you to take your nausea medication as directed.  BELOW ARE SYMPTOMS THAT SHOULD BE REPORTED IMMEDIATELY: *FEVER GREATER THAN 100.4 F (38 C) OR HIGHER *CHILLS OR SWEATING *NAUSEA AND VOMITING THAT IS NOT CONTROLLED WITH YOUR NAUSEA MEDICATION *UNUSUAL SHORTNESS OF BREATH *UNUSUAL BRUISING OR BLEEDING *URINARY PROBLEMS (pain or burning when urinating, or frequent urination) *BOWEL PROBLEMS (unusual diarrhea, constipation, pain near the anus) TENDERNESS IN MOUTH AND THROAT WITH OR WITHOUT PRESENCE OF ULCERS (sore throat, sores in mouth, or a toothache) UNUSUAL RASH, SWELLING OR PAIN  UNUSUAL VAGINAL DISCHARGE OR ITCHING   Items with * indicate a potential emergency and should be followed up as soon as possible or go to the Emergency Department if any problems should occur.  Please show the CHEMOTHERAPY ALERT CARD or IMMUNOTHERAPY  ALERT CARD at check-in to the Emergency Department and triage nurse.  Should you have questions after your visit or need to cancel or reschedule your appointment, please contact [facility] - A DEPT OF [facility]  Dept: [phone]  and follow the prompts.  Office hours are 8:00 a.m. to 4:30 p.m. Monday - Friday. Please note that voicemails left after 4:00 p.m. may not be returned until the following business day.  We are closed weekends and major holidays. You have access to a nurse at all times for urgent questions. Please call the main number to the clinic Dept: [phone] and follow the prompts.   For any non-urgent questions, you may also contact your provider using MyChart. We now offer e-Visits for anyone 60 and older to request care online for non-urgent symptoms. For details visit [email or web address].   Also download the MyChart app! Go to the app store, search "MyChart", open the app, select [place], and log in with your MyChart username and password.

## 2023-05-02 NOTE — Assessment & Plan Note (Addendum)
 She initially presented with postmenopausal bleeding in September 2023, biopsy reviewed high-grade malignancy with sarcomatoid features.  She underwent hysterectomy with tumor debulking but lymph nodes were not resectable Final pathology reviewed carcinosarcoma: MMR normal, MSI stable, PD-L1 CPS: 1%, Her2 positive, P53 + Neg genetics, ER negative She was treated with combination of carboplatin, paclitaxel and trastuzumab, completed by August 2024 with disease progression Her treatment was subsequently changed to trastuzumab Deruxtecan with positive response to therapy  Overall, she tolerated treatment well without major side effects apart from mild intermittent anemia I plan to repeat imaging study in May 2025 She will continue treatment indefinitely Her next echocardiogram will be due in June 2025

## 2023-05-02 NOTE — Progress Notes (Signed)
 [facility] OFFICE PROGRESS NOTE  Patient Care Team: Almeda Jacobs, MD as PCP - General (Hematology and Oncology) Scherrie Curt, MD as Consulting Physician [facility] Medicine)  Assessment & Plan Carcinosarcoma of body of uterus [facility]) She initially presented with postmenopausal bleeding in September 2023, biopsy reviewed high-grade malignancy with sarcomatoid features.  She underwent hysterectomy with tumor debulking but lymph nodes were not resectable Final pathology reviewed carcinosarcoma: MMR normal, MSI stable, PD-L1 CPS: 1%, Her2 positive, P53 + Neg genetics, ER negative She was treated with combination of carboplatin , paclitaxel  and trastuzumab , completed by August 2024 with disease progression Her treatment was subsequently changed to trastuzumab  Deruxtecan with positive response to therapy  Overall, she tolerated treatment well without major side effects apart from mild intermittent anemia I plan to repeat imaging study in May 2025 She will continue treatment indefinitely Her next echocardiogram will be due in June 2025 Pancytopenia, acquired [facility]) She has stable pancytopenia due to treatment She is not symptomatic We will proceed with treatment without delay  Orders Placed This Encounter  Procedures   CT ABDOMEN PELVIS W CONTRAST    Standing Status:   Future    Expected Date:   05/16/2023    Expiration Date:   05/01/2024    Scheduling Instructions:     No oral contrast    If indicated for the ordered procedure, I authorize the administration of contrast media per Radiology protocol:   Yes    Does the patient have a contrast media/X-ray dye allergy?:   No    Preferred imaging location?:   [facility]    If indicated for the ordered procedure, I authorize the administration of oral contrast media per Radiology protocol:   No    Reason for no oral contrast::   No need oral contrast     Almeda Jacobs, MD  INTERVAL HISTORY: she returns for treatment  follow-up Complications related to previous cycle of chemotherapy included pancytopenia, No recent sensation of chest pain or shortness of breath.  Denies recent infection or diarrhea  PHYSICAL EXAMINATION: ECOG PERFORMANCE STATUS: 1 - Symptomatic but completely ambulatory  Vitals:   05/02/23 0851  BP: 128/60  Pulse: 84  Resp: 18  Temp: 98.6 F (37 C)  SpO2: 100%   Filed Weights   05/02/23 0851  Weight: 138 lb 12.8 oz (63 kg)    Relevant data reviewed during this visit included CBC and CMP

## 2023-05-15 ENCOUNTER — Other Ambulatory Visit: Payer: Self-pay

## 2023-05-16 ENCOUNTER — Ambulatory Visit (HOSPITAL_COMMUNITY)
Admission: RE | Admit: 2023-05-16 | Discharge: 2023-05-16 | Disposition: A | Discharge status: Home / Self Care | Source: Ambulatory Visit | Attending: Hematology and Oncology | Admitting: Hematology and Oncology

## 2023-05-16 DIAGNOSIS — R932 Abnormal findings on diagnostic imaging of liver and biliary tract: Secondary | ICD-10-CM | POA: Diagnosis not present

## 2023-05-16 DIAGNOSIS — C549 Malignant neoplasm of corpus uteri, unspecified: Secondary | ICD-10-CM | POA: Diagnosis not present

## 2023-05-16 DIAGNOSIS — C55 Malignant neoplasm of uterus, part unspecified: Secondary | ICD-10-CM | POA: Diagnosis not present

## 2023-05-16 DIAGNOSIS — Z90710 Acquired absence of both cervix and uterus: Secondary | ICD-10-CM | POA: Diagnosis not present

## 2023-05-16 DIAGNOSIS — K5730 Diverticulosis of large intestine without perforation or abscess without bleeding: Secondary | ICD-10-CM | POA: Diagnosis not present

## 2023-05-16 MED ORDER — HEPARIN SOD (PORK) LOCK FLUSH 100 UNIT/ML IV SOLN
INTRAVENOUS | Status: AC
  Filled 2023-05-16: qty 5

## 2023-05-16 MED ORDER — IOHEXOL 300 MG/ML  SOLN
100.0000 mL | Freq: Once | INTRAMUSCULAR | Status: AC | PRN
  Administered 2023-05-16: 100 mL via INTRAVENOUS

## 2023-05-16 MED ORDER — HEPARIN SOD (PORK) LOCK FLUSH 100 UNIT/ML IV SOLN
500.0000 [IU] | Freq: Once | INTRAVENOUS | Status: AC
  Administered 2023-05-16: 500 [IU] via INTRAVENOUS

## 2023-05-22 MED FILL — Fosaprepitant Dimeglumine For IV Infusion 150 MG (Base Eq): INTRAVENOUS | Qty: 5 | Status: AC

## 2023-05-22 NOTE — Assessment & Plan Note (Signed)
 She initially presented with postmenopausal bleeding in September 2023, s/p hysterectomy with tumor debulking but lymph nodes were not resectable Final pathology reviewed carcinosarcoma: MMR normal, MSI stable, PD-L1 CPS: 1%, Her2 positive, P53 +, Neg genetics, ER negative  She was treated with combination of carboplatin , paclitaxel  and trastuzumab , completed by August 2024 with disease progression, treatment was subsequently changed to trastuzumab  Deruxtecan with positive response to therapy  Overall, she tolerated treatment well without major side effects apart from mild intermittent anemia I have reviewed imaging study from May 2025 with the patient which showed stable disease control with persistent lymphadenopathy but not worse She will continue treatment indefinitely Her next echocardiogram will be due in June 2025 I plan to space out her imaging study, next imaging would be in September 2025  Per patient request, we will space out her appointment to every 4 weeks

## 2023-05-23 ENCOUNTER — Encounter: Payer: Self-pay | Admitting: Hematology and Oncology

## 2023-05-23 ENCOUNTER — Inpatient Hospital Stay: Attending: Hematology and Oncology

## 2023-05-23 ENCOUNTER — Inpatient Hospital Stay

## 2023-05-23 ENCOUNTER — Inpatient Hospital Stay: Admitting: Hematology and Oncology

## 2023-05-23 VITALS — BP 145/83 | HR 83 | Temp 98.8°F | Resp 18 | Ht 59.3 in | Wt 133.4 lb

## 2023-05-23 DIAGNOSIS — Z5112 Encounter for antineoplastic immunotherapy: Secondary | ICD-10-CM | POA: Diagnosis not present

## 2023-05-23 DIAGNOSIS — N183 Chronic kidney disease, stage 3 unspecified: Secondary | ICD-10-CM

## 2023-05-23 DIAGNOSIS — R030 Elevated blood-pressure reading, without diagnosis of hypertension: Secondary | ICD-10-CM

## 2023-05-23 DIAGNOSIS — R634 Abnormal weight loss: Secondary | ICD-10-CM

## 2023-05-23 DIAGNOSIS — C549 Malignant neoplasm of corpus uteri, unspecified: Secondary | ICD-10-CM

## 2023-05-23 DIAGNOSIS — D61818 Other pancytopenia: Secondary | ICD-10-CM | POA: Insufficient documentation

## 2023-05-23 LAB — CMP (CANCER CENTER ONLY)
ALT: 30 U/L (ref 0–44)
AST: 38 U/L (ref 15–41)
Albumin: 3.7 g/dL (ref 3.5–5.0)
Alkaline Phosphatase: 167 U/L — ABNORMAL HIGH (ref 38–126)
Anion gap: 5 (ref 5–15)
BUN: 21 mg/dL (ref 8–23)
CO2: 27 mmol/L (ref 22–32)
Calcium: 8.9 mg/dL (ref 8.9–10.3)
Chloride: 107 mmol/L (ref 98–111)
Creatinine: 1 mg/dL (ref 0.44–1.00)
GFR, Estimated: 58 mL/min — ABNORMAL LOW (ref >60)
Glucose, Bld: 96 mg/dL (ref 70–99)
Potassium: 3.4 mmol/L — ABNORMAL LOW (ref 3.5–5.1)
Sodium: 139 mmol/L (ref 135–145)
Total Bilirubin: 0.4 mg/dL (ref 0.0–1.2)
Total Protein: 6.8 g/dL (ref 6.5–8.1)

## 2023-05-23 LAB — CBC WITH DIFFERENTIAL/PLATELET
Abs Immature Granulocytes: 0 10*3/uL (ref 0.00–0.07)
Basophils Absolute: 0 10*3/uL (ref 0.0–0.1)
Basophils Relative: 1 %
Eosinophils Absolute: 0.1 10*3/uL (ref 0.0–0.5)
Eosinophils Relative: 3 %
HCT: 32.4 % — ABNORMAL LOW (ref 36.0–46.0)
Hemoglobin: 10.8 g/dL — ABNORMAL LOW (ref 12.0–15.0)
Immature Granulocytes: 0 %
Lymphocytes Relative: 28 %
Lymphs Abs: 1.2 10*3/uL (ref 0.7–4.0)
MCH: 33.3 pg (ref 26.0–34.0)
MCHC: 33.3 g/dL (ref 30.0–36.0)
MCV: 100 fL (ref 80.0–100.0)
Monocytes Absolute: 0.6 10*3/uL (ref 0.1–1.0)
Monocytes Relative: 14 %
Neutro Abs: 2.2 10*3/uL (ref 1.7–7.7)
Neutrophils Relative %: 54 %
Platelets: 210 10*3/uL (ref 150–400)
RBC: 3.24 MIL/uL — ABNORMAL LOW (ref 3.87–5.11)
RDW: 17.1 % — ABNORMAL HIGH (ref 11.5–15.5)
WBC: 4.1 10*3/uL (ref 4.0–10.5)
nRBC: 0 % (ref 0.0–0.2)

## 2023-05-23 MED ORDER — SODIUM CHLORIDE 0.9 % IV SOLN
150.0000 mg | Freq: Once | INTRAVENOUS | Status: AC
  Administered 2023-05-23: 150 mg via INTRAVENOUS
  Filled 2023-05-23: qty 150

## 2023-05-23 MED ORDER — DEXAMETHASONE SODIUM PHOSPHATE 10 MG/ML IJ SOLN
10.0000 mg | Freq: Once | INTRAMUSCULAR | Status: AC
  Administered 2023-05-23: 10 mg via INTRAVENOUS
  Filled 2023-05-23: qty 1

## 2023-05-23 MED ORDER — SODIUM CHLORIDE 0.9% FLUSH
10.0000 mL | Freq: Once | INTRAVENOUS | Status: AC
  Administered 2023-05-23: 10 mL

## 2023-05-23 MED ORDER — FAM-TRASTUZUMAB DERUXTECAN-NXKI CHEMO 100 MG IV SOLR
5.4000 mg/kg | Freq: Once | INTRAVENOUS | Status: AC
  Administered 2023-05-23: 300 mg via INTRAVENOUS
  Filled 2023-05-23: qty 15

## 2023-05-23 MED ORDER — DEXTROSE 5 % IV SOLN: Freq: Once | INTRAVENOUS | Status: AC

## 2023-05-23 MED ORDER — PALONOSETRON HCL INJECTION 0.25 MG/5ML
0.2500 mg | Freq: Once | INTRAVENOUS | Status: AC
  Administered 2023-05-23: 0.25 mg via INTRAVENOUS
  Filled 2023-05-23: qty 5

## 2023-05-23 NOTE — Progress Notes (Signed)
 [facility] OFFICE PROGRESS NOTE  Patient Care Team: Almeda Jacobs, MD as PCP - General (Hematology and Oncology) Scherrie Curt, MD as Consulting Physician [facility] Medicine)  Assessment & Plan Carcinosarcoma of body of uterus [facility] She initially presented with postmenopausal bleeding in September 2023, s/p hysterectomy with tumor debulking but lymph nodes were not resectable Final pathology reviewed carcinosarcoma: MMR normal, MSI stable, PD-L1 CPS: 1%, Her2 positive, P53 +, Neg genetics, ER negative  She was treated with combination of carboplatin , paclitaxel  and trastuzumab , completed by August 2024 with disease progression, treatment was subsequently changed to trastuzumab  Deruxtecan with positive response to therapy  Overall, she tolerated treatment well without major side effects apart from mild intermittent anemia I have reviewed imaging study from May 2025 with the patient which showed stable disease control with persistent lymphadenopathy but not worse She will continue treatment indefinitely Her next echocardiogram will be due in June 2025 I plan to space out her imaging study, next imaging would be in September 2025  Per patient request, we will space out her appointment to every 4 weeks Pancytopenia, acquired (HCC) She has chronic intermittent pancytopenia due to treatment She is not symptomatic We will proceed with treatment without delay Weight loss We discussed importance of frequent small meals and high-protein intake  Orders Placed This Encounter  Procedures   CBC with Differential (Cancer Center Only)    Standing Status:   Future    Expected Date:   06/20/2023    Expiration Date:   06/19/2024   CMP (Cancer Center only)    Standing Status:   Future    Expected Date:   06/20/2023    Expiration Date:   06/19/2024   CBC with Differential (Cancer Center Only)    Standing Status:   Future    Expected Date:   07/18/2023    Expiration Date:   07/17/2024   CMP  (Cancer Center only)    Standing Status:   Future    Expected Date:   07/18/2023    Expiration Date:   07/17/2024   ECHOCARDIOGRAM COMPLETE    Standing Status:   Future    Expected Date:   06/13/2023    Expiration Date:   05/22/2024    Where should this test be performed:   [place]    Perflutren DEFINITY (image enhancing agent) should be administered unless hypersensitivity or allergy exist:   Administer Perflutren    Reason for exam-Echo:   Chemo  Z09     Almeda Jacobs, MD  INTERVAL HISTORY: she returns for treatment follow-up Complications related to previous cycle of chemotherapy included anemia,, weight loss,, and elevated BP  PHYSICAL EXAMINATION: ECOG PERFORMANCE STATUS: 0 - Asymptomatic  Vitals:   05/23/23 1308  BP: (!) 145/83  Pulse: 83  Resp: 18  Temp: 98.8 F (37.1 C)  SpO2: 97%   Filed Weights   05/23/23 1308  Weight: 133 lb 6.4 oz (60.5 kg)    Relevant data reviewed during this visit included CBC, CMP, CT imaging from May 2025

## 2023-05-23 NOTE — Patient Instructions (Signed)
 [facility] - A DEPT OF [facility]  Discharge Instructions: Thank you for choosing [facility] to provide your oncology and hematology care.   If you have a lab appointment with the Cancer Center, please go directly to the Cancer Center and check in at the registration area.   Wear comfortable clothing and clothing appropriate for easy access to any Portacath or PICC line.   We strive to give you quality time with your provider. You may need to reschedule your appointment if you arrive late (15 or more minutes).  Arriving late affects you and other patients whose appointments are after yours.  Also, if you miss three or more appointments without notifying the office, you may be dismissed from the clinic at the provider's discretion.      For prescription refill requests, have your pharmacy contact our office and allow 72 hours for refills to be completed.    Today you received the following chemotherapy and/or immunotherapy agents enhertu      To help prevent nausea and vomiting after your treatment, we encourage you to take your nausea medication as directed.  BELOW ARE SYMPTOMS THAT SHOULD BE REPORTED IMMEDIATELY: *FEVER GREATER THAN 100.4 F (38 C) OR HIGHER *CHILLS OR SWEATING *NAUSEA AND VOMITING THAT IS NOT CONTROLLED WITH YOUR NAUSEA MEDICATION *UNUSUAL SHORTNESS OF BREATH *UNUSUAL BRUISING OR BLEEDING *URINARY PROBLEMS (pain or burning when urinating, or frequent urination) *BOWEL PROBLEMS (unusual diarrhea, constipation, pain near the anus) TENDERNESS IN MOUTH AND THROAT WITH OR WITHOUT PRESENCE OF ULCERS (sore throat, sores in mouth, or a toothache) UNUSUAL RASH, SWELLING OR PAIN  UNUSUAL VAGINAL DISCHARGE OR ITCHING   Items with * indicate a potential emergency and should be followed up as soon as possible or go to the Emergency Department if any problems should occur.  Please show the CHEMOTHERAPY ALERT CARD or IMMUNOTHERAPY  ALERT CARD at check-in to the Emergency Department and triage nurse.  Should you have questions after your visit or need to cancel or reschedule your appointment, please contact [facility] - A DEPT OF [facility]  Dept: [phone]  and follow the prompts.  Office hours are 8:00 a.m. to 4:30 p.m. Monday - Friday. Please note that voicemails left after 4:00 p.m. may not be returned until the following business day.  We are closed weekends and major holidays. You have access to a nurse at all times for urgent questions. Please call the main number to the clinic Dept: [phone] and follow the prompts.   For any non-urgent questions, you may also contact your provider using MyChart. We now offer e-Visits for anyone 60 and older to request care online for non-urgent symptoms. For details visit [email or web address].   Also download the MyChart app! Go to the app store, search "MyChart", open the app, select [place], and log in with your MyChart username and password.

## 2023-05-23 NOTE — Assessment & Plan Note (Addendum)
We discussed importance of frequent small meals and high-protein intake 

## 2023-05-23 NOTE — Assessment & Plan Note (Addendum)
 She has chronic intermittent pancytopenia due to treatment She is not symptomatic We will proceed with treatment without delay

## 2023-05-24 ENCOUNTER — Other Ambulatory Visit: Payer: Self-pay

## 2023-06-02 ENCOUNTER — Other Ambulatory Visit: Payer: Self-pay | Admitting: Hematology and Oncology

## 2023-06-19 ENCOUNTER — Ambulatory Visit (HOSPITAL_COMMUNITY)
Admission: RE | Admit: 2023-06-19 | Discharge: 2023-06-19 | Disposition: A | Discharge status: Home / Self Care | Source: Ambulatory Visit | Attending: Hematology and Oncology | Admitting: Hematology and Oncology

## 2023-06-19 DIAGNOSIS — R030 Elevated blood-pressure reading, without diagnosis of hypertension: Secondary | ICD-10-CM | POA: Insufficient documentation

## 2023-06-19 DIAGNOSIS — I358 Other nonrheumatic aortic valve disorders: Secondary | ICD-10-CM | POA: Insufficient documentation

## 2023-06-19 DIAGNOSIS — C549 Malignant neoplasm of corpus uteri, unspecified: Secondary | ICD-10-CM | POA: Insufficient documentation

## 2023-06-19 DIAGNOSIS — Z0189 Encounter for other specified special examinations: Secondary | ICD-10-CM

## 2023-06-19 LAB — ECHOCARDIOGRAM COMPLETE
AR max vel: 1.68 cm2
AV Area VTI: 1.78 cm2
AV Area mean vel: 1.43 cm2
AV Mean grad: 5 mmHg
AV Peak grad: 9.4 mmHg
Ao pk vel: 1.53 m/s
Area-P 1/2: 3.95 cm2
Calc EF: 68.6 %
S' Lateral: 2.4 cm
Single Plane A2C EF: 68.2 %
Single Plane A4C EF: 66.7 %

## 2023-06-19 MED FILL — Fosaprepitant Dimeglumine For IV Infusion 150 MG (Base Eq): INTRAVENOUS | Qty: 5 | Status: AC

## 2023-06-19 NOTE — Progress Notes (Signed)
*  PRELIMINARY RESULTS* Echocardiogram 2D Echocardiogram has been performed.  Kayla Mcgrath 06/19/2023, 10:54 AM

## 2023-06-20 ENCOUNTER — Encounter: Payer: Self-pay | Admitting: Hematology and Oncology

## 2023-06-20 ENCOUNTER — Inpatient Hospital Stay

## 2023-06-20 ENCOUNTER — Inpatient Hospital Stay: Admitting: Hematology and Oncology

## 2023-06-20 ENCOUNTER — Inpatient Hospital Stay: Attending: Hematology and Oncology

## 2023-06-20 VITALS — BP 137/75 | HR 77 | Temp 98.4°F | Resp 18 | Ht 59.3 in | Wt 134.4 lb

## 2023-06-20 DIAGNOSIS — Z90711 Acquired absence of uterus with remaining cervical stump: Secondary | ICD-10-CM | POA: Diagnosis not present

## 2023-06-20 DIAGNOSIS — N183 Chronic kidney disease, stage 3 unspecified: Secondary | ICD-10-CM | POA: Insufficient documentation

## 2023-06-20 DIAGNOSIS — G620 Drug-induced polyneuropathy: Secondary | ICD-10-CM

## 2023-06-20 DIAGNOSIS — D631 Anemia in chronic kidney disease: Secondary | ICD-10-CM | POA: Diagnosis not present

## 2023-06-20 DIAGNOSIS — Z5112 Encounter for antineoplastic immunotherapy: Secondary | ICD-10-CM | POA: Insufficient documentation

## 2023-06-20 DIAGNOSIS — C549 Malignant neoplasm of corpus uteri, unspecified: Secondary | ICD-10-CM | POA: Insufficient documentation

## 2023-06-20 DIAGNOSIS — T451X5A Adverse effect of antineoplastic and immunosuppressive drugs, initial encounter: Secondary | ICD-10-CM | POA: Insufficient documentation

## 2023-06-20 LAB — CMP (CANCER CENTER ONLY)
ALT: 36 U/L (ref 0–44)
AST: 43 U/L — ABNORMAL HIGH (ref 15–41)
Albumin: 3.6 g/dL (ref 3.5–5.0)
Alkaline Phosphatase: 186 U/L — ABNORMAL HIGH (ref 38–126)
Anion gap: 7 (ref 5–15)
BUN: 23 mg/dL (ref 8–23)
CO2: 25 mmol/L (ref 22–32)
Calcium: 9 mg/dL (ref 8.9–10.3)
Chloride: 108 mmol/L (ref 98–111)
Creatinine: 0.98 mg/dL (ref 0.44–1.00)
GFR, Estimated: 60 mL/min — ABNORMAL LOW (ref >60)
Glucose, Bld: 102 mg/dL — ABNORMAL HIGH (ref 70–99)
Potassium: 3.6 mmol/L (ref 3.5–5.1)
Sodium: 140 mmol/L (ref 135–145)
Total Bilirubin: 0.4 mg/dL (ref 0.0–1.2)
Total Protein: 6.7 g/dL (ref 6.5–8.1)

## 2023-06-20 LAB — CBC WITH DIFFERENTIAL (CANCER CENTER ONLY)
Abs Immature Granulocytes: 0.01 10*3/uL (ref 0.00–0.07)
Basophils Absolute: 0.1 10*3/uL (ref 0.0–0.1)
Basophils Relative: 1 %
Eosinophils Absolute: 0.1 10*3/uL (ref 0.0–0.5)
Eosinophils Relative: 3 %
HCT: 31.7 % — ABNORMAL LOW (ref 36.0–46.0)
Hemoglobin: 10.6 g/dL — ABNORMAL LOW (ref 12.0–15.0)
Immature Granulocytes: 0 %
Lymphocytes Relative: 21 %
Lymphs Abs: 1 10*3/uL (ref 0.7–4.0)
MCH: 32.7 pg (ref 26.0–34.0)
MCHC: 33.4 g/dL (ref 30.0–36.0)
MCV: 97.8 fL (ref 80.0–100.0)
Monocytes Absolute: 0.5 10*3/uL (ref 0.1–1.0)
Monocytes Relative: 12 %
Neutro Abs: 2.9 10*3/uL (ref 1.7–7.7)
Neutrophils Relative %: 63 %
Platelet Count: 169 10*3/uL (ref 150–400)
RBC: 3.24 MIL/uL — ABNORMAL LOW (ref 3.87–5.11)
RDW: 16.8 % — ABNORMAL HIGH (ref 11.5–15.5)
WBC Count: 4.6 10*3/uL (ref 4.0–10.5)
nRBC: 0 % (ref 0.0–0.2)

## 2023-06-20 MED ORDER — DEXTROSE 5 % IV SOLN: Freq: Once | INTRAVENOUS | Status: AC

## 2023-06-20 MED ORDER — PALONOSETRON HCL INJECTION 0.25 MG/5ML
0.2500 mg | Freq: Once | INTRAVENOUS | Status: AC
  Administered 2023-06-20: 0.25 mg via INTRAVENOUS
  Filled 2023-06-20: qty 5

## 2023-06-20 MED ORDER — SODIUM CHLORIDE 0.9% FLUSH
10.0000 mL | Freq: Once | INTRAVENOUS | Status: AC
  Administered 2023-06-20: 10 mL

## 2023-06-20 MED ORDER — DEXAMETHASONE SODIUM PHOSPHATE 10 MG/ML IJ SOLN
10.0000 mg | Freq: Once | INTRAMUSCULAR | Status: AC
  Administered 2023-06-20: 10 mg via INTRAVENOUS
  Filled 2023-06-20: qty 1

## 2023-06-20 MED ORDER — SODIUM CHLORIDE 0.9 % IV SOLN
150.0000 mg | Freq: Once | INTRAVENOUS | Status: AC
  Administered 2023-06-20: 150 mg via INTRAVENOUS
  Filled 2023-06-20: qty 150

## 2023-06-20 MED ORDER — FAM-TRASTUZUMAB DERUXTECAN-NXKI CHEMO 100 MG IV SOLR
5.4000 mg/kg | Freq: Once | INTRAVENOUS | Status: AC
  Administered 2023-06-20: 300 mg via INTRAVENOUS
  Filled 2023-06-20: qty 15

## 2023-06-20 NOTE — Assessment & Plan Note (Addendum)
 She initially presented with postmenopausal bleeding in September 2023, s/p hysterectomy with tumor debulking but lymph nodes were not resectable Final pathology reviewed carcinosarcoma: MMR normal, MSI stable, PD-L1 CPS: 1%, Her2 positive, P53 +, Neg genetics, ER negative  She was treated with combination of carboplatin , paclitaxel  and trastuzumab , completed by August 2024 with disease progression, treatment was subsequently changed to trastuzumab  Deruxtecan with positive response to therapy  Overall, she tolerated treatment well without major side effects apart from mild intermittent anemia I have reviewed imaging study from May 2025 with the patient which showed stable disease control with persistent lymphadenopathy but not worse She will continue treatment indefinitely Recent echocardiogram in June 2025 show preserved ejection fraction I plan to space out her imaging study, next imaging would be in September 2025  Per patient request, we will space out her appointment to every 4 weeks

## 2023-06-20 NOTE — Progress Notes (Signed)
 [facility] OFFICE PROGRESS NOTE  Patient Care Team: Almeda Jacobs, MD as PCP - General (Hematology and Oncology) Scherrie Curt, MD as Consulting Physician [facility] Medicine)  Assessment & Plan Carcinosarcoma of body of uterus [facility]) She initially presented with postmenopausal bleeding in September 2023, s/p hysterectomy with tumor debulking but lymph nodes were not resectable Final pathology reviewed carcinosarcoma: MMR normal, MSI stable, PD-L1 CPS: 1%, Her2 positive, P53 +, Neg genetics, ER negative  She was treated with combination of carboplatin , paclitaxel  and trastuzumab , completed by August 2024 with disease progression, treatment was subsequently changed to trastuzumab  Deruxtecan with positive response to therapy  Overall, she tolerated treatment well without major side effects apart from mild intermittent anemia I have reviewed imaging study from May 2025 with the patient which showed stable disease control with persistent lymphadenopathy but not worse She will continue treatment indefinitely Recent echocardiogram in June 2025 show preserved ejection fraction I plan to space out her imaging study, next imaging would be in September 2025  Per patient request, we will space out her appointment to every 4 weeks Anemia in stage 3 chronic kidney disease, unspecified whether stage 3a or 3b CKD (HCC) Her blood count is stable She is not symptomatic Observe only Stage 3 chronic kidney disease, unspecified whether stage 3a or 3b CKD (HCC) She has history of intermittent kidney failure We will monitor that closely Peripheral neuropathy due to chemotherapy [facility]) She has residual neuropathy from prior treatment We discussed risk and benefits of gabapentin but at this point in time, she declined medication  No orders of the defined types were placed in this encounter.    Almeda Jacobs, MD  INTERVAL HISTORY: she returns for treatment follow-up Complications related to  previous cycle of chemotherapy included anemia, and peripheral neuropathy, She tolerated recent treatment well She complained of residual neuropathy that causes difficulties when she tries to go to bed at night   PHYSICAL EXAMINATION: ECOG PERFORMANCE STATUS: 1 - Symptomatic but completely ambulatory  Lab Results  Component Value Date   CAN125 8.3 03/21/2023   CAN125 6.5 02/28/2023   CAN125 8.4 01/17/2023      Latest Ref Rng & Units 06/20/2023   12:56 PM 05/23/2023   12:37 PM 05/02/2023    8:27 AM  CBC  WBC 4.0 - 10.5 K/uL 4.6  4.1  2.8   Hemoglobin 12.0 - 15.0 g/dL 16.1  09.6  04.5   Hematocrit 36.0 - 46.0 % 31.7  32.4  31.2   Platelets 150 - 400 K/uL 169  210  187       Chemistry      Component Value Date/Time   NA 140 06/20/2023 1256   NA 140 11/02/2016 1130   K 3.6 06/20/2023 1256   K 3.8 11/02/2016 1130   CL 108 06/20/2023 1256   CL 105 05/02/2012 0822   CO2 25 06/20/2023 1256   CO2 26 11/02/2016 1130   BUN 23 06/20/2023 1256   BUN 14.7 11/02/2016 1130   CREATININE 0.98 06/20/2023 1256   CREATININE 0.84 12/05/2018 1014   CREATININE 0.8 11/02/2016 1130      Component Value Date/Time   CALCIUM  9.0 06/20/2023 1256   CALCIUM  9.7 11/02/2016 1130   ALKPHOS 186 (H) 06/20/2023 1256   ALKPHOS 59 11/02/2016 1130   AST 43 (H) 06/20/2023 1256   AST 23 11/02/2016 1130   ALT 36 06/20/2023 1256   ALT 17 11/02/2016 1130   BILITOT 0.4 06/20/2023 1256   BILITOT 0.34  11/02/2016 1130       Vitals:   06/20/23 1318  BP: 137/75  Pulse: 77  Resp: 18  Temp: 98.4 F (36.9 C)  SpO2: 98%   Filed Weights   06/20/23 1318  Weight: 134 lb 6.4 oz (61 kg)   Other relevant data reviewed during this visit included CBC, CMP and echocardiogram

## 2023-06-20 NOTE — Assessment & Plan Note (Addendum)
 She has residual neuropathy from prior treatment We discussed risk and benefits of gabapentin but at this point in time, she declined medication

## 2023-06-20 NOTE — Assessment & Plan Note (Addendum)
 She has history of intermittent kidney failure We will monitor that closely

## 2023-06-20 NOTE — Assessment & Plan Note (Addendum)
 Her blood count is stable She is not symptomatic Observe only

## 2023-06-20 NOTE — Patient Instructions (Signed)
 [facility] - A DEPT OF [facility]  Discharge Instructions: Thank you for choosing [facility] to provide your oncology and hematology care.   If you have a lab appointment with the Cancer Center, please go directly to the Cancer Center and check in at the registration area.   Wear comfortable clothing and clothing appropriate for easy access to any Portacath or PICC line.   We strive to give you quality time with your provider. You may need to reschedule your appointment if you arrive late (15 or more minutes).  Arriving late affects you and other patients whose appointments are after yours.  Also, if you miss three or more appointments without notifying the office, you may be dismissed from the clinic at the provider's discretion.      For prescription refill requests, have your pharmacy contact our office and allow 72 hours for refills to be completed.    Today you received the following chemotherapy and/or immunotherapy agents enhertu      To help prevent nausea and vomiting after your treatment, we encourage you to take your nausea medication as directed.  BELOW ARE SYMPTOMS THAT SHOULD BE REPORTED IMMEDIATELY: *FEVER GREATER THAN 100.4 F (38 C) OR HIGHER *CHILLS OR SWEATING *NAUSEA AND VOMITING THAT IS NOT CONTROLLED WITH YOUR NAUSEA MEDICATION *UNUSUAL SHORTNESS OF BREATH *UNUSUAL BRUISING OR BLEEDING *URINARY PROBLEMS (pain or burning when urinating, or frequent urination) *BOWEL PROBLEMS (unusual diarrhea, constipation, pain near the anus) TENDERNESS IN MOUTH AND THROAT WITH OR WITHOUT PRESENCE OF ULCERS (sore throat, sores in mouth, or a toothache) UNUSUAL RASH, SWELLING OR PAIN  UNUSUAL VAGINAL DISCHARGE OR ITCHING   Items with * indicate a potential emergency and should be followed up as soon as possible or go to the Emergency Department if any problems should occur.  Please show the CHEMOTHERAPY ALERT CARD or IMMUNOTHERAPY  ALERT CARD at check-in to the Emergency Department and triage nurse.  Should you have questions after your visit or need to cancel or reschedule your appointment, please contact [facility] - A DEPT OF [facility]  Dept: [phone]  and follow the prompts.  Office hours are 8:00 a.m. to 4:30 p.m. Monday - Friday. Please note that voicemails left after 4:00 p.m. may not be returned until the following business day.  We are closed weekends and major holidays. You have access to a nurse at all times for urgent questions. Please call the main number to the clinic Dept: [phone] and follow the prompts.   For any non-urgent questions, you may also contact your provider using MyChart. We now offer e-Visits for anyone 60 and older to request care online for non-urgent symptoms. For details visit [email or web address].   Also download the MyChart app! Go to the app store, search "MyChart", open the app, select [place], and log in with your MyChart username and password.

## 2023-06-29 ENCOUNTER — Other Ambulatory Visit: Payer: Self-pay | Admitting: Hematology and Oncology

## 2023-06-30 ENCOUNTER — Encounter: Payer: Self-pay | Admitting: Hematology and Oncology

## 2023-07-04 ENCOUNTER — Other Ambulatory Visit: Payer: Self-pay

## 2023-07-17 MED FILL — Fosaprepitant Dimeglumine For IV Infusion 150 MG (Base Eq): INTRAVENOUS | Qty: 5 | Status: AC

## 2023-07-18 ENCOUNTER — Inpatient Hospital Stay: Attending: Hematology and Oncology | Admitting: Hematology and Oncology

## 2023-07-18 ENCOUNTER — Inpatient Hospital Stay

## 2023-07-18 ENCOUNTER — Other Ambulatory Visit

## 2023-07-18 ENCOUNTER — Encounter: Payer: Self-pay | Admitting: Hematology and Oncology

## 2023-07-18 VITALS — BP 130/74 | HR 76 | Temp 98.3°F | Resp 20 | Wt 133.5 lb

## 2023-07-18 DIAGNOSIS — C549 Malignant neoplasm of corpus uteri, unspecified: Secondary | ICD-10-CM | POA: Insufficient documentation

## 2023-07-18 DIAGNOSIS — D631 Anemia in chronic kidney disease: Secondary | ICD-10-CM | POA: Insufficient documentation

## 2023-07-18 DIAGNOSIS — N183 Chronic kidney disease, stage 3 unspecified: Secondary | ICD-10-CM | POA: Diagnosis not present

## 2023-07-18 DIAGNOSIS — Z171 Estrogen receptor negative status [ER-]: Secondary | ICD-10-CM | POA: Diagnosis not present

## 2023-07-18 DIAGNOSIS — Z5112 Encounter for antineoplastic immunotherapy: Secondary | ICD-10-CM | POA: Diagnosis not present

## 2023-07-18 LAB — CBC WITH DIFFERENTIAL (CANCER CENTER ONLY)
Abs Immature Granulocytes: 0.01 K/uL (ref 0.00–0.07)
Basophils Absolute: 0.1 K/uL (ref 0.0–0.1)
Basophils Relative: 1 %
Eosinophils Absolute: 0.3 K/uL (ref 0.0–0.5)
Eosinophils Relative: 6 %
HCT: 32.4 % — ABNORMAL LOW (ref 36.0–46.0)
Hemoglobin: 10.9 g/dL — ABNORMAL LOW (ref 12.0–15.0)
Immature Granulocytes: 0 %
Lymphocytes Relative: 23 %
Lymphs Abs: 1.1 K/uL (ref 0.7–4.0)
MCH: 32.2 pg (ref 26.0–34.0)
MCHC: 33.6 g/dL (ref 30.0–36.0)
MCV: 95.9 fL (ref 80.0–100.0)
Monocytes Absolute: 0.7 K/uL (ref 0.1–1.0)
Monocytes Relative: 15 %
Neutro Abs: 2.6 K/uL (ref 1.7–7.7)
Neutrophils Relative %: 55 %
Platelet Count: 175 K/uL (ref 150–400)
RBC: 3.38 MIL/uL — ABNORMAL LOW (ref 3.87–5.11)
RDW: 16.9 % — ABNORMAL HIGH (ref 11.5–15.5)
WBC Count: 4.7 K/uL (ref 4.0–10.5)
nRBC: 0 % (ref 0.0–0.2)

## 2023-07-18 LAB — CMP (CANCER CENTER ONLY)
ALT: 40 U/L (ref 0–44)
AST: 44 U/L — ABNORMAL HIGH (ref 15–41)
Albumin: 3.5 g/dL (ref 3.5–5.0)
Alkaline Phosphatase: 221 U/L — ABNORMAL HIGH (ref 38–126)
Anion gap: 7 (ref 5–15)
BUN: 20 mg/dL (ref 8–23)
CO2: 25 mmol/L (ref 22–32)
Calcium: 9 mg/dL (ref 8.9–10.3)
Chloride: 107 mmol/L (ref 98–111)
Creatinine: 1.04 mg/dL — ABNORMAL HIGH (ref 0.44–1.00)
GFR, Estimated: 56 mL/min — ABNORMAL LOW (ref >60)
Glucose, Bld: 89 mg/dL (ref 70–99)
Potassium: 4 mmol/L (ref 3.5–5.1)
Sodium: 139 mmol/L (ref 135–145)
Total Bilirubin: 0.4 mg/dL (ref 0.0–1.2)
Total Protein: 6.6 g/dL (ref 6.5–8.1)

## 2023-07-18 MED ORDER — PALONOSETRON HCL INJECTION 0.25 MG/5ML
0.2500 mg | Freq: Once | INTRAVENOUS | Status: AC
  Administered 2023-07-18: 0.25 mg via INTRAVENOUS
  Filled 2023-07-18: qty 5

## 2023-07-18 MED ORDER — SODIUM CHLORIDE 0.9% FLUSH
10.0000 mL | Freq: Once | INTRAVENOUS | Status: AC
  Administered 2023-07-18: 10 mL

## 2023-07-18 MED ORDER — HEPARIN SOD (PORK) LOCK FLUSH 100 UNIT/ML IV SOLN
500.0000 [IU] | Freq: Once | INTRAVENOUS | Status: AC | PRN
  Administered 2023-07-18: 500 [IU]

## 2023-07-18 MED ORDER — DEXAMETHASONE SODIUM PHOSPHATE 10 MG/ML IJ SOLN
10.0000 mg | Freq: Once | INTRAMUSCULAR | Status: AC
  Administered 2023-07-18: 10 mg via INTRAVENOUS
  Filled 2023-07-18: qty 1

## 2023-07-18 MED ORDER — SODIUM CHLORIDE 0.9 % IV SOLN
150.0000 mg | Freq: Once | INTRAVENOUS | Status: AC
  Administered 2023-07-18: 150 mg via INTRAVENOUS
  Filled 2023-07-18: qty 150

## 2023-07-18 MED ORDER — PROCHLORPERAZINE MALEATE 10 MG PO TABS: 10.0000 mg | ORAL_TABLET | Freq: Four times a day (QID) | ORAL | 1 refills | Status: AC | PRN

## 2023-07-18 MED ORDER — SODIUM CHLORIDE 0.9% FLUSH
10.0000 mL | INTRAVENOUS | Status: DC | PRN
Start: 2023-07-18 — End: 2023-07-18
  Administered 2023-07-18: 10 mL

## 2023-07-18 MED ORDER — FAM-TRASTUZUMAB DERUXTECAN-NXKI CHEMO 100 MG IV SOLR
5.4000 mg/kg | Freq: Once | INTRAVENOUS | Status: AC
  Administered 2023-07-18: 300 mg via INTRAVENOUS
  Filled 2023-07-18: qty 15

## 2023-07-18 MED ORDER — DEXTROSE 5 % IV SOLN
Freq: Once | INTRAVENOUS | Status: AC
Start: 2023-07-18 — End: 2023-07-18

## 2023-07-18 NOTE — Progress Notes (Signed)
 [facility] OFFICE PROGRESS NOTE  Patient Care Team: Lonn Hicks, MD as PCP - General (Hematology and Oncology) Watt Mirza, MD as Consulting Physician [facility] Medicine)  Assessment & Plan Carcinosarcoma of body of uterus [facility]) She initially presented with postmenopausal bleeding in September 2023, s/p hysterectomy with tumor debulking but lymph nodes were not resectable Final pathology reviewed carcinosarcoma: MMR normal, MSI stable, PD-L1 CPS: 1%, Her2 positive, P53 +, Neg genetics, ER negative  She was treated with combination of carboplatin , paclitaxel  and trastuzumab , completed by August 2024 with disease progression, treatment was subsequently changed to trastuzumab  Deruxtecan with positive response to therapy  Overall, she tolerated treatment well without major side effects apart from mild intermittent anemia I have reviewed imaging study from May 2025 with the patient which showed stable disease control with persistent lymphadenopathy but not worse She will continue treatment indefinitely Recent echocardiogram in June 2025 show preserved ejection fraction I plan to space out her imaging study, next imaging would be in September 2025  Per patient request, we will space out her appointment to every 4 weeks So far, she tolerated treatment well with persistent anemia but otherwise asymptomatic Anemia in stage 3 chronic kidney disease, unspecified whether stage 3a or 3b CKD (HCC) Her blood count is stable She is not symptomatic Observe only Stage 3 chronic kidney disease, unspecified whether stage 3a or 3b CKD (HCC) She has history of intermittent kidney failure We will monitor that closely  Orders Placed This Encounter  Procedures   CBC with Differential (Cancer Center Only)    Standing Status:   Future    Expected Date:   08/15/2023    Expiration Date:   08/14/2024   CMP (Cancer Center only)    Standing Status:   Future    Expected Date:   08/15/2023     Expiration Date:   08/14/2024   CBC with Differential (Cancer Center Only)    Standing Status:   Future    Expected Date:   09/12/2023    Expiration Date:   09/11/2024   CMP (Cancer Center only)    Standing Status:   Future    Expected Date:   09/12/2023    Expiration Date:   09/11/2024     Hicks Lonn, MD  INTERVAL HISTORY: she returns for treatment follow-up Complications related to previous cycle of chemotherapy included anemia, She denies nausea but request prescription refills of antiemetics to take as needed  PHYSICAL EXAMINATION: ECOG PERFORMANCE STATUS: 0 - Asymptomatic  Lab Results  Component Value Date   CAN125 8.3 03/21/2023   CAN125 6.5 02/28/2023   CAN125 8.4 01/17/2023      Latest Ref Rng & Units 07/18/2023   11:39 AM 06/20/2023   12:56 PM 05/23/2023   12:37 PM  CBC  WBC 4.0 - 10.5 K/uL 4.7  4.6  4.1   Hemoglobin 12.0 - 15.0 g/dL 89.0  89.3  89.1   Hematocrit 36.0 - 46.0 % 32.4  31.7  32.4   Platelets 150 - 400 K/uL 175  169  210       Chemistry      Component Value Date/Time   NA 140 06/20/2023 1256   NA 140 11/02/2016 1130   K 3.6 06/20/2023 1256   K 3.8 11/02/2016 1130   CL 108 06/20/2023 1256   CL 105 05/02/2012 0822   CO2 25 06/20/2023 1256   CO2 26 11/02/2016 1130   BUN 23 06/20/2023 1256   BUN 14.7 11/02/2016 1130  CREATININE 0.98 06/20/2023 1256   CREATININE 0.84 12/05/2018 1014   CREATININE 0.8 11/02/2016 1130      Component Value Date/Time   CALCIUM  9.0 06/20/2023 1256   CALCIUM  9.7 11/02/2016 1130   ALKPHOS 186 (H) 06/20/2023 1256   ALKPHOS 59 11/02/2016 1130   AST 43 (H) 06/20/2023 1256   AST 23 11/02/2016 1130   ALT 36 06/20/2023 1256   ALT 17 11/02/2016 1130   BILITOT 0.4 06/20/2023 1256   BILITOT 0.34 11/02/2016 1130       There were no vitals filed for this visit. There were no vitals filed for this visit. Other relevant data reviewed during this visit included CBC and CMP

## 2023-07-18 NOTE — Assessment & Plan Note (Addendum)
 She has history of intermittent kidney failure We will monitor that closely

## 2023-07-18 NOTE — Assessment & Plan Note (Addendum)
 Her blood count is stable She is not symptomatic Observe only

## 2023-07-18 NOTE — Assessment & Plan Note (Addendum)
 She initially presented with postmenopausal bleeding in September 2023, s/p hysterectomy with tumor debulking but lymph nodes were not resectable Final pathology reviewed carcinosarcoma: MMR normal, MSI stable, PD-L1 CPS: 1%, Her2 positive, P53 +, Neg genetics, ER negative  She was treated with combination of carboplatin , paclitaxel  and trastuzumab , completed by August 2024 with disease progression, treatment was subsequently changed to trastuzumab  Deruxtecan with positive response to therapy  Overall, she tolerated treatment well without major side effects apart from mild intermittent anemia I have reviewed imaging study from May 2025 with the patient which showed stable disease control with persistent lymphadenopathy but not worse She will continue treatment indefinitely Recent echocardiogram in June 2025 show preserved ejection fraction I plan to space out her imaging study, next imaging would be in September 2025  Per patient request, we will space out her appointment to every 4 weeks So far, she tolerated treatment well with persistent anemia but otherwise asymptomatic

## 2023-07-19 ENCOUNTER — Other Ambulatory Visit: Payer: Self-pay

## 2023-08-02 ENCOUNTER — Other Ambulatory Visit: Payer: Self-pay | Admitting: Hematology and Oncology

## 2023-08-02 DIAGNOSIS — Z1231 Encounter for screening mammogram for malignant neoplasm of breast: Secondary | ICD-10-CM

## 2023-08-14 ENCOUNTER — Other Ambulatory Visit: Payer: Self-pay | Admitting: Hematology and Oncology

## 2023-08-14 MED FILL — Fosaprepitant Dimeglumine For IV Infusion 150 MG (Base Eq): INTRAVENOUS | Qty: 5 | Status: AC

## 2023-08-15 ENCOUNTER — Encounter: Payer: Self-pay | Admitting: Hematology and Oncology

## 2023-08-15 ENCOUNTER — Inpatient Hospital Stay

## 2023-08-15 ENCOUNTER — Inpatient Hospital Stay (HOSPITAL_BASED_OUTPATIENT_CLINIC_OR_DEPARTMENT_OTHER): Admitting: Hematology and Oncology

## 2023-08-15 ENCOUNTER — Inpatient Hospital Stay: Attending: Hematology and Oncology

## 2023-08-15 VITALS — BP 132/70 | HR 79 | Resp 18 | Ht 59.3 in | Wt 136.8 lb

## 2023-08-15 DIAGNOSIS — N183 Chronic kidney disease, stage 3 unspecified: Secondary | ICD-10-CM

## 2023-08-15 DIAGNOSIS — D631 Anemia in chronic kidney disease: Secondary | ICD-10-CM

## 2023-08-15 DIAGNOSIS — C549 Malignant neoplasm of corpus uteri, unspecified: Secondary | ICD-10-CM | POA: Insufficient documentation

## 2023-08-15 DIAGNOSIS — Z5112 Encounter for antineoplastic immunotherapy: Secondary | ICD-10-CM | POA: Diagnosis not present

## 2023-08-15 DIAGNOSIS — Z171 Estrogen receptor negative status [ER-]: Secondary | ICD-10-CM | POA: Insufficient documentation

## 2023-08-15 DIAGNOSIS — Z1731 Human epidermal growth factor receptor 2 positive status: Secondary | ICD-10-CM | POA: Diagnosis not present

## 2023-08-15 LAB — CBC WITH DIFFERENTIAL (CANCER CENTER ONLY)
Abs Immature Granulocytes: 0.01 K/uL (ref 0.00–0.07)
Basophils Absolute: 0.1 K/uL (ref 0.0–0.1)
Basophils Relative: 1 %
Eosinophils Absolute: 0.2 K/uL (ref 0.0–0.5)
Eosinophils Relative: 3 %
HCT: 32 % — ABNORMAL LOW (ref 36.0–46.0)
Hemoglobin: 10.7 g/dL — ABNORMAL LOW (ref 12.0–15.0)
Immature Granulocytes: 0 %
Lymphocytes Relative: 21 %
Lymphs Abs: 1.1 K/uL (ref 0.7–4.0)
MCH: 31.8 pg (ref 26.0–34.0)
MCHC: 33.4 g/dL (ref 30.0–36.0)
MCV: 95.2 fL (ref 80.0–100.0)
Monocytes Absolute: 0.7 K/uL (ref 0.1–1.0)
Monocytes Relative: 14 %
Neutro Abs: 3.1 K/uL (ref 1.7–7.7)
Neutrophils Relative %: 61 %
Platelet Count: 182 K/uL (ref 150–400)
RBC: 3.36 MIL/uL — ABNORMAL LOW (ref 3.87–5.11)
RDW: 17 % — ABNORMAL HIGH (ref 11.5–15.5)
WBC Count: 5.1 K/uL (ref 4.0–10.5)
nRBC: 0 % (ref 0.0–0.2)

## 2023-08-15 LAB — CMP (CANCER CENTER ONLY)
ALT: 43 U/L (ref 0–44)
AST: 48 U/L — ABNORMAL HIGH (ref 15–41)
Albumin: 3.6 g/dL (ref 3.5–5.0)
Alkaline Phosphatase: 249 U/L — ABNORMAL HIGH (ref 38–126)
Anion gap: 7 (ref 5–15)
BUN: 23 mg/dL (ref 8–23)
CO2: 27 mmol/L (ref 22–32)
Calcium: 9 mg/dL (ref 8.9–10.3)
Chloride: 104 mmol/L (ref 98–111)
Creatinine: 0.99 mg/dL (ref 0.44–1.00)
GFR, Estimated: 59 mL/min — ABNORMAL LOW (ref >60)
Glucose, Bld: 96 mg/dL (ref 70–99)
Potassium: 3.9 mmol/L (ref 3.5–5.1)
Sodium: 138 mmol/L (ref 135–145)
Total Bilirubin: 0.4 mg/dL (ref 0.0–1.2)
Total Protein: 6.8 g/dL (ref 6.5–8.1)

## 2023-08-15 MED ORDER — DEXTROSE 5 % IV SOLN
Freq: Once | INTRAVENOUS | Status: AC
Start: 2023-08-15 — End: 2023-08-15

## 2023-08-15 MED ORDER — DEXAMETHASONE SODIUM PHOSPHATE 10 MG/ML IJ SOLN
10.0000 mg | Freq: Once | INTRAMUSCULAR | Status: AC
  Administered 2023-08-15: 10 mg via INTRAVENOUS
  Filled 2023-08-15: qty 1

## 2023-08-15 MED ORDER — FAM-TRASTUZUMAB DERUXTECAN-NXKI CHEMO 100 MG IV SOLR
5.4000 mg/kg | Freq: Once | INTRAVENOUS | Status: AC
  Administered 2023-08-15: 300 mg via INTRAVENOUS
  Filled 2023-08-15: qty 15

## 2023-08-15 MED ORDER — FOSAPREPITANT DIMEGLUMINE INJECTION 150 MG
150.0000 mg | Freq: Once | INTRAVENOUS | Status: AC
  Administered 2023-08-15: 150 mg via INTRAVENOUS
  Filled 2023-08-15: qty 150

## 2023-08-15 MED ORDER — PALONOSETRON HCL INJECTION 0.25 MG/5ML
0.2500 mg | Freq: Once | INTRAVENOUS | Status: AC
  Administered 2023-08-15: 0.25 mg via INTRAVENOUS
  Filled 2023-08-15: qty 5

## 2023-08-15 MED ORDER — SODIUM CHLORIDE 0.9% FLUSH
10.0000 mL | Freq: Once | INTRAVENOUS | Status: AC
  Administered 2023-08-15: 10 mL

## 2023-08-15 NOTE — Progress Notes (Signed)
 [facility] OFFICE PROGRESS NOTE  Patient Care Team: Lonn Hicks, MD as PCP - General (Hematology and Oncology) Watt Mirza, MD as Consulting Physician [facility] Medicine)  Assessment & Plan Carcinosarcoma of body of uterus [facility]) She initially presented with postmenopausal bleeding in September 2023, s/p hysterectomy with tumor debulking but lymph nodes were not resectable Final pathology reviewed carcinosarcoma: MMR normal, MSI stable, PD-L1 CPS: 1%, Her2 positive, P53 +, Neg genetics, ER negative  She was treated with combination of carboplatin , paclitaxel  and trastuzumab , completed by August 2024 with disease progression, treatment was subsequently changed to trastuzumab  Deruxtecan with positive response to therapy CT imaging study from May 2025 with the patient which showed stable disease control with persistent lymphadenopathy but not worse Recent echocardiogram in June 2025 show preserved ejection fraction I plan to space out her imaging study, next imaging would be in September 2025  Per patient request, we will space out her appointment to every 4 weeks So far, she tolerated treatment well with persistent anemia but otherwise asymptomatic Anemia in stage 3 chronic kidney disease, unspecified whether stage 3a or 3b CKD (HCC) Her blood count is stable She is not symptomatic Observe only  No orders of the defined types were placed in this encounter.    Hicks Lonn, MD  INTERVAL HISTORY: she returns for treatment follow-up Complications related to previous cycle of chemotherapy included anemia, She denies signs or symptoms of congestive heart failure No shortness of breath  PHYSICAL EXAMINATION: ECOG PERFORMANCE STATUS: 1 - Symptomatic but completely ambulatory  Lab Results  Component Value Date   CAN125 8.3 03/21/2023   CAN125 6.5 02/28/2023   CAN125 8.4 01/17/2023      Latest Ref Rng & Units 08/15/2023   11:49 AM 07/18/2023   11:39 AM 06/20/2023    12:56 PM  CBC  WBC 4.0 - 10.5 K/uL 5.1  4.7  4.6   Hemoglobin 12.0 - 15.0 g/dL 89.2  89.0  89.3   Hematocrit 36.0 - 46.0 % 32.0  32.4  31.7   Platelets 150 - 400 K/uL 182  175  169       Chemistry      Component Value Date/Time   NA 138 08/15/2023 1149   NA 140 11/02/2016 1130   K 3.9 08/15/2023 1149   K 3.8 11/02/2016 1130   CL 104 08/15/2023 1149   CL 105 05/02/2012 0822   CO2 27 08/15/2023 1149   CO2 26 11/02/2016 1130   BUN 23 08/15/2023 1149   BUN 14.7 11/02/2016 1130   CREATININE 0.99 08/15/2023 1149   CREATININE 0.84 12/05/2018 1014   CREATININE 0.8 11/02/2016 1130      Component Value Date/Time   CALCIUM  9.0 08/15/2023 1149   CALCIUM  9.7 11/02/2016 1130   ALKPHOS 249 (H) 08/15/2023 1149   ALKPHOS 59 11/02/2016 1130   AST 48 (H) 08/15/2023 1149   AST 23 11/02/2016 1130   ALT 43 08/15/2023 1149   ALT 17 11/02/2016 1130   BILITOT 0.4 08/15/2023 1149   BILITOT 0.34 11/02/2016 1130       Vitals:   08/15/23 1218  BP: 132/70  Pulse: 79  Resp: 18  SpO2: 96%   Filed Weights   08/15/23 1218  Weight: 136 lb 12.8 oz (62.1 kg)   Other relevant data reviewed during this visit included CBC and CMP

## 2023-08-15 NOTE — Assessment & Plan Note (Addendum)
 She initially presented with postmenopausal bleeding in September 2023, s/p hysterectomy with tumor debulking but lymph nodes were not resectable Final pathology reviewed carcinosarcoma: MMR normal, MSI stable, PD-L1 CPS: 1%, Her2 positive, P53 +, Neg genetics, ER negative  She was treated with combination of carboplatin , paclitaxel  and trastuzumab , completed by August 2024 with disease progression, treatment was subsequently changed to trastuzumab  Deruxtecan with positive response to therapy CT imaging study from May 2025 with the patient which showed stable disease control with persistent lymphadenopathy but not worse Recent echocardiogram in June 2025 show preserved ejection fraction I plan to space out her imaging study, next imaging would be in September 2025  Per patient request, we will space out her appointment to every 4 weeks So far, she tolerated treatment well with persistent anemia but otherwise asymptomatic

## 2023-08-15 NOTE — Assessment & Plan Note (Addendum)
 Her blood count is stable She is not symptomatic Observe only

## 2023-08-15 NOTE — Patient Instructions (Signed)
 [facility] - A DEPT OF [facility]  Discharge Instructions: Thank you for choosing [facility] to provide your oncology and hematology care.   If you have a lab appointment with the Cancer Center, please go directly to the Cancer Center and check in at the registration area.   Wear comfortable clothing and clothing appropriate for easy access to any Portacath or PICC line.   We strive to give you quality time with your provider. You may need to reschedule your appointment if you arrive late (15 or more minutes).  Arriving late affects you and other patients whose appointments are after yours.  Also, if you miss three or more appointments without notifying the office, you may be dismissed from the clinic at the provider's discretion.      For prescription refill requests, have your pharmacy contact our office and allow 72 hours for refills to be completed.    Today you received the following chemotherapy and/or immunotherapy agents enhertu      To help prevent nausea and vomiting after your treatment, we encourage you to take your nausea medication as directed.  BELOW ARE SYMPTOMS THAT SHOULD BE REPORTED IMMEDIATELY: *FEVER GREATER THAN 100.4 F (38 C) OR HIGHER *CHILLS OR SWEATING *NAUSEA AND VOMITING THAT IS NOT CONTROLLED WITH YOUR NAUSEA MEDICATION *UNUSUAL SHORTNESS OF BREATH *UNUSUAL BRUISING OR BLEEDING *URINARY PROBLEMS (pain or burning when urinating, or frequent urination) *BOWEL PROBLEMS (unusual diarrhea, constipation, pain near the anus) TENDERNESS IN MOUTH AND THROAT WITH OR WITHOUT PRESENCE OF ULCERS (sore throat, sores in mouth, or a toothache) UNUSUAL RASH, SWELLING OR PAIN  UNUSUAL VAGINAL DISCHARGE OR ITCHING   Items with * indicate a potential emergency and should be followed up as soon as possible or go to the Emergency Department if any problems should occur.  Please show the CHEMOTHERAPY ALERT CARD or IMMUNOTHERAPY  ALERT CARD at check-in to the Emergency Department and triage nurse.  Should you have questions after your visit or need to cancel or reschedule your appointment, please contact [facility] - A DEPT OF [facility]  Dept: [phone]  and follow the prompts.  Office hours are 8:00 a.m. to 4:30 p.m. Monday - Friday. Please note that voicemails left after 4:00 p.m. may not be returned until the following business day.  We are closed weekends and major holidays. You have access to a nurse at all times for urgent questions. Please call the main number to the clinic Dept: [phone] and follow the prompts.   For any non-urgent questions, you may also contact your provider using MyChart. We now offer e-Visits for anyone 60 and older to request care online for non-urgent symptoms. For details visit [email or web address].   Also download the MyChart app! Go to the app store, search "MyChart", open the app, select [place], and log in with your MyChart username and password.

## 2023-08-17 ENCOUNTER — Other Ambulatory Visit: Payer: Self-pay

## 2023-09-04 ENCOUNTER — Ambulatory Visit
Admission: RE | Admit: 2023-09-04 | Discharge: 2023-09-04 | Disposition: A | Discharge status: Home / Self Care | Source: Ambulatory Visit | Attending: Hematology and Oncology | Admitting: Hematology and Oncology

## 2023-09-04 DIAGNOSIS — Z1231 Encounter for screening mammogram for malignant neoplasm of breast: Secondary | ICD-10-CM

## 2023-09-08 MED FILL — Fosaprepitant Dimeglumine For IV Infusion 150 MG (Base Eq): INTRAVENOUS | Qty: 5 | Status: AC

## 2023-09-12 ENCOUNTER — Inpatient Hospital Stay: Attending: Hematology and Oncology

## 2023-09-12 ENCOUNTER — Encounter: Payer: Self-pay | Admitting: Hematology and Oncology

## 2023-09-12 ENCOUNTER — Inpatient Hospital Stay

## 2023-09-12 ENCOUNTER — Inpatient Hospital Stay: Admitting: Hematology and Oncology

## 2023-09-12 VITALS — BP 153/77 | HR 82 | Temp 98.6°F | Resp 18 | Ht 59.0 in | Wt 134.8 lb

## 2023-09-12 DIAGNOSIS — Z1731 Human epidermal growth factor receptor 2 positive status: Secondary | ICD-10-CM | POA: Diagnosis not present

## 2023-09-12 DIAGNOSIS — Z171 Estrogen receptor negative status [ER-]: Secondary | ICD-10-CM | POA: Diagnosis not present

## 2023-09-12 DIAGNOSIS — R918 Other nonspecific abnormal finding of lung field: Secondary | ICD-10-CM | POA: Diagnosis not present

## 2023-09-12 DIAGNOSIS — D631 Anemia in chronic kidney disease: Secondary | ICD-10-CM

## 2023-09-12 DIAGNOSIS — N183 Chronic kidney disease, stage 3 unspecified: Secondary | ICD-10-CM | POA: Diagnosis not present

## 2023-09-12 DIAGNOSIS — Z90710 Acquired absence of both cervix and uterus: Secondary | ICD-10-CM | POA: Insufficient documentation

## 2023-09-12 DIAGNOSIS — Z5112 Encounter for antineoplastic immunotherapy: Secondary | ICD-10-CM | POA: Insufficient documentation

## 2023-09-12 DIAGNOSIS — C549 Malignant neoplasm of corpus uteri, unspecified: Secondary | ICD-10-CM | POA: Insufficient documentation

## 2023-09-12 LAB — CMP (CANCER CENTER ONLY)
ALT: 30 U/L (ref 0–44)
AST: 40 U/L (ref 15–41)
Albumin: 3.5 g/dL (ref 3.5–5.0)
Alkaline Phosphatase: 217 U/L — ABNORMAL HIGH (ref 38–126)
Anion gap: 7 (ref 5–15)
BUN: 19 mg/dL (ref 8–23)
CO2: 25 mmol/L (ref 22–32)
Calcium: 9.1 mg/dL (ref 8.9–10.3)
Chloride: 105 mmol/L (ref 98–111)
Creatinine: 0.99 mg/dL (ref 0.44–1.00)
GFR, Estimated: 59 mL/min — ABNORMAL LOW (ref >60)
Glucose, Bld: 96 mg/dL (ref 70–99)
Potassium: 3.9 mmol/L (ref 3.5–5.1)
Sodium: 137 mmol/L (ref 135–145)
Total Bilirubin: 0.4 mg/dL (ref 0.0–1.2)
Total Protein: 6.8 g/dL (ref 6.5–8.1)

## 2023-09-12 LAB — CBC WITH DIFFERENTIAL (CANCER CENTER ONLY)
Abs Immature Granulocytes: 0.01 K/uL (ref 0.00–0.07)
Basophils Absolute: 0 K/uL (ref 0.0–0.1)
Basophils Relative: 1 %
Eosinophils Absolute: 0.2 K/uL (ref 0.0–0.5)
Eosinophils Relative: 4 %
HCT: 33.2 % — ABNORMAL LOW (ref 36.0–46.0)
Hemoglobin: 10.9 g/dL — ABNORMAL LOW (ref 12.0–15.0)
Immature Granulocytes: 0 %
Lymphocytes Relative: 17 %
Lymphs Abs: 0.8 K/uL (ref 0.7–4.0)
MCH: 30.7 pg (ref 26.0–34.0)
MCHC: 32.8 g/dL (ref 30.0–36.0)
MCV: 93.5 fL (ref 80.0–100.0)
Monocytes Absolute: 0.7 K/uL (ref 0.1–1.0)
Monocytes Relative: 15 %
Neutro Abs: 2.9 K/uL (ref 1.7–7.7)
Neutrophils Relative %: 63 %
Platelet Count: 177 K/uL (ref 150–400)
RBC: 3.55 MIL/uL — ABNORMAL LOW (ref 3.87–5.11)
RDW: 17.7 % — ABNORMAL HIGH (ref 11.5–15.5)
WBC Count: 4.7 K/uL (ref 4.0–10.5)
nRBC: 0 % (ref 0.0–0.2)

## 2023-09-12 MED ORDER — DEXTROSE 5 % IV SOLN: Freq: Once | INTRAVENOUS | Status: AC

## 2023-09-12 MED ORDER — DEXAMETHASONE SODIUM PHOSPHATE 10 MG/ML IJ SOLN
10.0000 mg | Freq: Once | INTRAMUSCULAR | Status: AC
  Administered 2023-09-12: 10 mg via INTRAVENOUS
  Filled 2023-09-12: qty 1

## 2023-09-12 MED ORDER — SODIUM CHLORIDE 0.9 % IV SOLN
150.0000 mg | Freq: Once | INTRAVENOUS | Status: AC
  Administered 2023-09-12: 150 mg via INTRAVENOUS
  Filled 2023-09-12: qty 150

## 2023-09-12 MED ORDER — PALONOSETRON HCL INJECTION 0.25 MG/5ML
0.2500 mg | Freq: Once | INTRAVENOUS | Status: AC
  Administered 2023-09-12: 0.25 mg via INTRAVENOUS
  Filled 2023-09-12: qty 5

## 2023-09-12 MED ORDER — FAM-TRASTUZUMAB DERUXTECAN-NXKI CHEMO 100 MG IV SOLR
5.4000 mg/kg | Freq: Once | INTRAVENOUS | Status: AC
  Administered 2023-09-12: 300 mg via INTRAVENOUS
  Filled 2023-09-12: qty 15

## 2023-09-12 NOTE — Assessment & Plan Note (Addendum)
 She initially presented with postmenopausal bleeding in September 2023, s/p hysterectomy with tumor debulking but lymph nodes were not resectable Final pathology reviewed carcinosarcoma: MMR normal, MSI stable, PD-L1 CPS: 1%, Her2 positive, P53 +, Neg genetics, ER negative  She was treated with combination of carboplatin , paclitaxel  and trastuzumab , completed by August 2024 with disease progression, treatment was subsequently changed to trastuzumab  Deruxtecan with positive response to therapy CT imaging study from May 2025 with the patient which showed stable disease control with persistent lymphadenopathy but not worse Recent echocardiogram in June 2025 show preserved ejection fraction We will proceed with treatment today Per patient request, we will space out her appointment to every 4 weeks Her next echocardiogram and CT imaging will be end of this month

## 2023-09-12 NOTE — Assessment & Plan Note (Addendum)
 Her blood count is stable She is not symptomatic Observe only

## 2023-09-12 NOTE — Progress Notes (Signed)
 [facility] OFFICE PROGRESS NOTE  Patient Care Team: Lonn Hicks, MD as PCP - General (Hematology and Oncology) Watt Mirza, MD as Consulting Physician [facility] Medicine)  Assessment & Plan Carcinosarcoma of body of uterus [facility]) She initially presented with postmenopausal bleeding in September 2023, s/p hysterectomy with tumor debulking but lymph nodes were not resectable Final pathology reviewed carcinosarcoma: MMR normal, MSI stable, PD-L1 CPS: 1%, Her2 positive, P53 +, Neg genetics, ER negative  She was treated with combination of carboplatin , paclitaxel  and trastuzumab , completed by August 2024 with disease progression, treatment was subsequently changed to trastuzumab  Deruxtecan with positive response to therapy CT imaging study from May 2025 with the patient which showed stable disease control with persistent lymphadenopathy but not worse Recent echocardiogram in June 2025 show preserved ejection fraction We will proceed with treatment today Per patient request, we will space out her appointment to every 4 weeks Her next echocardiogram and CT imaging will be end of this month Anemia in stage 3 chronic kidney disease, unspecified whether stage 3a or 3b CKD (HCC) Her blood count is stable She is not symptomatic Observe only  Orders Placed This Encounter  Procedures   CT CHEST ABDOMEN PELVIS W CONTRAST    Standing Status:   Future    Expected Date:   10/03/2023    Expiration Date:   09/11/2024    If indicated for the ordered procedure, I authorize the administration of contrast media per Radiology protocol:   Yes    Does the patient have a contrast media/X-ray dye allergy?:   No    Preferred imaging location?:   [facility]    If indicated for the ordered procedure, I authorize the administration of oral contrast media per Radiology protocol:   No    Reason for no oral contrast::   no need oral contrast   CBC with Differential (Cancer Center Only)     Standing Status:   Future    Expected Date:   10/10/2023    Expiration Date:   10/09/2024   CMP (Cancer Center only)    Standing Status:   Future    Expected Date:   10/10/2023    Expiration Date:   10/09/2024   CBC with Differential (Cancer Center Only)    Standing Status:   Future    Expected Date:   11/07/2023    Expiration Date:   11/06/2024   CMP (Cancer Center only)    Standing Status:   Future    Expected Date:   11/07/2023    Expiration Date:   11/06/2024   ECHOCARDIOGRAM COMPLETE    Standing Status:   Future    Expected Date:   10/03/2023    Expiration Date:   09/11/2024    Perflutren DEFINITY (image enhancing agent) should be administered unless hypersensitivity or allergy exist:   Administer Perflutren    Where [facility] this test [facility] performed[facility]    Does the patient weigh less than or greater than 250 lbs?:   Patient weighs less than 250 lbs    Reason for exam-Echo:   Chemo  Z09     Hicks Lonn, MD  INTERVAL HISTORY: she returns for treatment follow-up Complications related to previous cycle of chemotherapy included anemia,  PHYSICAL EXAMINATION: ECOG PERFORMANCE STATUS: 1 - Symptomatic but completely ambulatory  Lab Results  Component Value Date   CAN125 8.3 03/21/2023   CAN125 6.5 02/28/2023   CAN125 8.4 01/17/2023  Latest Ref Rng & Units 09/12/2023    9:14 AM 08/15/2023   11:49 AM 07/18/2023   11:39 AM  CBC  WBC 4.0 - 10.5 K/uL 4.7  5.1  4.7   Hemoglobin 12.0 - 15.0 g/dL 89.0  89.2  89.0   Hematocrit 36.0 - 46.0 % 33.2  32.0  32.4   Platelets 150 - 400 K/uL 177  182  175       Chemistry      Component Value Date/Time   NA 137 09/12/2023 0914   NA 140 11/02/2016 1130   K 3.9 09/12/2023 0914   K 3.8 11/02/2016 1130   CL 105 09/12/2023 0914   CL 105 05/02/2012 0822   CO2 25 09/12/2023 0914   CO2 26 11/02/2016 1130   BUN 19 09/12/2023 0914   BUN 14.7 11/02/2016 1130   CREATININE 0.99 09/12/2023 0914   CREATININE 0.84 12/05/2018 1014    CREATININE 0.8 11/02/2016 1130      Component Value Date/Time   CALCIUM  9.1 09/12/2023 0914   CALCIUM  9.7 11/02/2016 1130   ALKPHOS 217 (H) 09/12/2023 0914   ALKPHOS 59 11/02/2016 1130   AST 40 09/12/2023 0914   AST 23 11/02/2016 1130   ALT 30 09/12/2023 0914   ALT 17 11/02/2016 1130   BILITOT 0.4 09/12/2023 0914   BILITOT 0.34 11/02/2016 1130       Vitals:   09/12/23 0944  BP: (!) 153/77  Pulse: 82  Resp: 18  Temp: 98.6 F (37 C)  SpO2: 97%   Filed Weights   09/12/23 0944  Weight: 134 lb 12.8 oz (61.1 kg)   Other relevant data reviewed during this visit included CBC and CMP

## 2023-09-13 ENCOUNTER — Other Ambulatory Visit: Payer: Self-pay

## 2023-10-03 ENCOUNTER — Ambulatory Visit (HOSPITAL_COMMUNITY)
Admission: RE | Admit: 2023-10-03 | Discharge: 2023-10-03 | Disposition: A | Discharge status: Home / Self Care | Source: Ambulatory Visit | Attending: Hematology and Oncology | Admitting: Hematology and Oncology

## 2023-10-03 DIAGNOSIS — R918 Other nonspecific abnormal finding of lung field: Secondary | ICD-10-CM | POA: Diagnosis not present

## 2023-10-03 DIAGNOSIS — C549 Malignant neoplasm of corpus uteri, unspecified: Secondary | ICD-10-CM | POA: Diagnosis not present

## 2023-10-03 DIAGNOSIS — K5730 Diverticulosis of large intestine without perforation or abscess without bleeding: Secondary | ICD-10-CM | POA: Diagnosis not present

## 2023-10-03 DIAGNOSIS — Z90710 Acquired absence of both cervix and uterus: Secondary | ICD-10-CM | POA: Diagnosis not present

## 2023-10-03 DIAGNOSIS — C55 Malignant neoplasm of uterus, part unspecified: Secondary | ICD-10-CM | POA: Diagnosis not present

## 2023-10-03 MED ORDER — HEPARIN SOD (PORK) LOCK FLUSH 100 UNIT/ML IV SOLN
INTRAVENOUS | Status: AC
  Filled 2023-10-03: qty 5

## 2023-10-03 MED ORDER — SODIUM CHLORIDE (PF) 0.9 % IJ SOLN
INTRAMUSCULAR | Status: AC
  Filled 2023-10-03: qty 50

## 2023-10-03 MED ORDER — HEPARIN SOD (PORK) LOCK FLUSH 100 UNIT/ML IV SOLN
500.0000 [IU] | Freq: Once | INTRAVENOUS | Status: AC
  Administered 2023-10-03: 500 [IU] via INTRAVENOUS

## 2023-10-03 MED ORDER — IOHEXOL 300 MG/ML  SOLN
100.0000 mL | Freq: Once | INTRAMUSCULAR | Status: AC | PRN
  Administered 2023-10-03: 100 mL via INTRAVENOUS

## 2023-10-05 ENCOUNTER — Encounter (HOSPITAL_COMMUNITY): Payer: Self-pay

## 2023-10-05 ENCOUNTER — Other Ambulatory Visit: Payer: Self-pay | Admitting: Hematology and Oncology

## 2023-10-05 ENCOUNTER — Ambulatory Visit (HOSPITAL_COMMUNITY): Admission: RE | Admit: 2023-10-05 | Source: Ambulatory Visit

## 2023-10-05 ENCOUNTER — Telehealth: Payer: Self-pay | Admitting: Oncology

## 2023-10-05 NOTE — Telephone Encounter (Signed)
 Left a message regarding apt tomorrow with Dr. Lonn.  Requested a return call.

## 2023-10-05 NOTE — Telephone Encounter (Signed)
 Abbie called back and appointment was scheduled with Dr. Lonn for tomorrow at 3:20.  Satya also mentioned she had an echo scheduled for today and asked if she could see Dr. Lonn today.  Advised her that Dr. Buzz schedule is full today and also let her know that we are canceling the echo because her treatment will be changing.

## 2023-10-06 ENCOUNTER — Inpatient Hospital Stay: Admitting: Hematology and Oncology

## 2023-10-06 ENCOUNTER — Encounter: Payer: Self-pay | Admitting: Hematology and Oncology

## 2023-10-06 VITALS — BP 137/78 | HR 97 | Temp 99.3°F | Resp 18 | Ht 59.0 in | Wt 137.0 lb

## 2023-10-06 DIAGNOSIS — C549 Malignant neoplasm of corpus uteri, unspecified: Secondary | ICD-10-CM | POA: Diagnosis not present

## 2023-10-06 DIAGNOSIS — N183 Chronic kidney disease, stage 3 unspecified: Secondary | ICD-10-CM | POA: Diagnosis not present

## 2023-10-06 DIAGNOSIS — R918 Other nonspecific abnormal finding of lung field: Secondary | ICD-10-CM | POA: Diagnosis not present

## 2023-10-06 DIAGNOSIS — Z1731 Human epidermal growth factor receptor 2 positive status: Secondary | ICD-10-CM | POA: Diagnosis not present

## 2023-10-06 DIAGNOSIS — Z5112 Encounter for antineoplastic immunotherapy: Secondary | ICD-10-CM | POA: Diagnosis not present

## 2023-10-06 DIAGNOSIS — D631 Anemia in chronic kidney disease: Secondary | ICD-10-CM | POA: Diagnosis not present

## 2023-10-06 DIAGNOSIS — Z171 Estrogen receptor negative status [ER-]: Secondary | ICD-10-CM | POA: Diagnosis not present

## 2023-10-06 DIAGNOSIS — Z90710 Acquired absence of both cervix and uterus: Secondary | ICD-10-CM | POA: Diagnosis not present

## 2023-10-06 NOTE — Progress Notes (Signed)
 [facility] OFFICE PROGRESS NOTE  Patient Care Team: Lonn Hicks, MD as PCP - General (Hematology and Oncology) Watt Mirza, MD as Consulting Physician [facility] Medicine)  Assessment & Plan Multiple lung nodules on CT This is indeterminate Carcinosarcoma of body of uterus [facility]) She initially presented with postmenopausal bleeding in September 2023, s/p hysterectomy with tumor debulking but lymph nodes were not resectable Final pathology reviewed carcinosarcoma: MMR normal, MSI stable, PD-L1 CPS: 1%, Her2 positive, P53 +, Neg genetics, ER negative  She was treated with combination of carboplatin , paclitaxel  and trastuzumab , completed by August 2024 with disease progression, treatment was subsequently changed to trastuzumab  Deruxtecan with positive response to therapy CT imaging study from May 2025 showed stable disease control but unfortunately, most recent CT imaging from September 2025 showed disease progression with new solid nodule in the right upper lobe worrisome for possible metastatic disease Overall, she has initial primary refractory disease to carboplatin , paclitaxel , trastuzumab  and now have resistant disease to trastuzumab  Deruxtecan I have reviewed her most recent NCCN guidelines with the patient I recommend consideration for combination treatment with lenvatinib/pembrolizumab versus other chemotherapy such as liposomal doxorubicin, gemcitabine and even consideration for palliative radiation therapy  At the end of today, she is undecided but will discuss this with family She will call me early next week for final decision She is not symptomatic from her disease progression  No orders of the defined types were placed in this encounter.    Hicks Lonn, MD  INTERVAL HISTORY: she returns for surveillance follow-up and to discuss test results She denies recent cough or infection She has no recent abdominal pain We spent majority of her time reviewing multiple  imaging studies and discussed various treatment options  PHYSICAL EXAMINATION: ECOG PERFORMANCE STATUS: 0 - Asymptomatic  Vitals:   10/06/23 1515  BP: 137/78  Pulse: 97  Resp: 18  Temp: 99.3 F (37.4 C)  SpO2: 95%   Filed Weights   10/06/23 1515  Weight: 137 lb (62.1 kg)    Relevant data reviewed during this visit included CT imaging from February 2025, May 2025 and September 2025

## 2023-10-06 NOTE — Assessment & Plan Note (Addendum)
 She initially presented with postmenopausal bleeding in September 2023, s/p hysterectomy with tumor debulking but lymph nodes were not resectable Final pathology reviewed carcinosarcoma: MMR normal, MSI stable, PD-L1 CPS: 1%, Her2 positive, P53 +, Neg genetics, ER negative  She was treated with combination of carboplatin , paclitaxel  and trastuzumab , completed by August 2024 with disease progression, treatment was subsequently changed to trastuzumab  Deruxtecan with positive response to therapy CT imaging study from May 2025 showed stable disease control but unfortunately, most recent CT imaging from September 2025 showed disease progression with new solid nodule in the right upper lobe worrisome for possible metastatic disease Overall, she has initial primary refractory disease to carboplatin , paclitaxel , trastuzumab  and now have resistant disease to trastuzumab  Deruxtecan I have reviewed her most recent NCCN guidelines with the patient I recommend consideration for combination treatment with lenvatinib/pembrolizumab versus other chemotherapy such as liposomal doxorubicin, gemcitabine and even consideration for palliative radiation therapy  At the end of today, she is undecided but will discuss this with family She will call me early next week for final decision She is not symptomatic from her disease progression

## 2023-10-06 NOTE — Assessment & Plan Note (Signed)
 This is indeterminate

## 2023-10-09 ENCOUNTER — Other Ambulatory Visit: Payer: Self-pay

## 2023-10-09 ENCOUNTER — Telehealth: Payer: Self-pay | Admitting: Pharmacist

## 2023-10-09 ENCOUNTER — Encounter: Payer: Self-pay | Admitting: Hematology and Oncology

## 2023-10-09 ENCOUNTER — Telehealth: Payer: Self-pay

## 2023-10-09 ENCOUNTER — Other Ambulatory Visit: Payer: Self-pay | Admitting: Hematology and Oncology

## 2023-10-09 ENCOUNTER — Other Ambulatory Visit (HOSPITAL_COMMUNITY): Payer: Self-pay

## 2023-10-09 DIAGNOSIS — C55 Malignant neoplasm of uterus, part unspecified: Secondary | ICD-10-CM

## 2023-10-09 MED ORDER — LENVATINIB (10 MG DAILY DOSE) 10 MG PO CPPK
10.0000 mg | ORAL_CAPSULE | Freq: Every day | ORAL | 11 refills | Status: AC
  Filled 2023-10-09: qty 30, 30d supply, fill #0
  Filled 2023-11-07: qty 30, 30d supply, fill #1
  Filled 2023-11-29 – 2023-12-06 (×2): qty 30, 30d supply, fill #2
  Filled 2024-01-02: qty 30, 30d supply, fill #3
  Filled 2024-01-31 – 2024-02-12 (×3): qty 30, 30d supply, fill #4

## 2023-10-09 NOTE — Telephone Encounter (Signed)
 [facility]       Telephone: [phone]?Fax: [phone]   Oncology Clinical Pharmacist Practitioner Initial Assessment  Kayla Mcgrath is a 78 y.o. female with a diagnosis of uterine cancer. They were contacted today via telephone visit.  I connected with Kayla Mcgrath today by telephone and verified that I was speaking with the correct person using two patient identifiers. I discussed the limitations, risks, security and privacy concerns of performing an evaluation and management service by telemedicine and the availability of in-person appointments. The patient/caregiver expressed understanding and agreed to proceed.  Other persons participating in the visit and their role in the encounter: none  Patient's location: home  Provider's location: clinic  Indication/Regimen Lenvatinib (Lenvima) is being used appropriately for treatment of uterine cancer by Dr. Almarie Bedford.      Wt Readings from Last 1 Encounters:  10/06/23 137 lb (62.1 kg)    Estimated body surface area is 1.61 meters squared as calculated from the following:   Height as of 10/06/23: 4' 11 (1.499 m).   Weight as of 10/06/23: 137 lb (62.1 kg).  The dosing regimen is 10 mg by mouth once daily on days 1 to 28 of a 28-day cycle. This is being given  in combination with pembrolizumab 200 mg IV which is planned to start on 10/16/23. It is planned to continue until treatment plan completion or unacceptable toxicity. Prescription dose and frequency assessed for appropriateness. The treatment goal is: Control.  Patient has agreed to treatment which is documented in physician note on 10/09/23. Counseled patient on administration, dosing, side effects, monitoring, drug-food interactions, safe handling, storage, and disposal.  Medication will be delivered on Friday and Kayla Mcgrath will start lenvatinib after seeing Dr. Bedford and reviewing labs on Monday 10/16/23.  Dose Modifications Dr. Bedford starting at  reduced dose of lenvatinib 10 mg by mouth daily   Access Assessment Kayla Mcgrath will be receiving lenvatinib through [facility] Concerns: none Start date if known: after visit with Dr. Bedford 10/16/23  Adherence Assessment Reviewed importance on keeping a med schedule and plan for any missed doses Barriers to adherence identified? No  Communication and Learning Assessment Primary learner: patient Barriers to learning: No barriers Preferred language: English Learning preferences: Listening   Allergies Allergies  Allergen Reactions   Tylenol  [Acetaminophen ] Hives and Swelling    Lip swelling   Latex Rash    Vitals    10/06/2023    3:15 PM 09/12/2023    9:44 AM 08/15/2023   12:18 PM  Oncology Vitals  Height 150 cm 150 cm 151 cm  Weight 62.143 kg 61.145 kg 62.052 kg  Weight (lbs) 137 lbs 134 lbs 13 oz 136 lbs 13 oz  BMI 27.67 kg/m2 27.23 kg/m2 27.35 kg/m2  Temp 99.3 F (37.4 C) 98.6 F (37 C)   Pulse Rate 97 82 79  BP 137/78 153/77 132/70  Resp 18 18 18   SpO2 95 % 97 % 96 %  BSA (m2) 1.61 m2 1.59 m2 1.61 m2     Laboratory Data    Latest Ref Rng & Units 09/12/2023    9:14 AM 08/15/2023   11:49 AM 07/18/2023   11:39 AM  CBC EXTENDED  WBC 4.0 - 10.5 K/uL 4.7  5.1  4.7   RBC 3.87 - 5.11 MIL/uL 3.55  3.36  3.38   Hemoglobin 12.0 - 15.0 g/dL 89.0  89.2  89.0   HCT 36.0 - 46.0 %  33.2  32.0  32.4   Platelets 150 - 400 K/uL 177  182  175   NEUT# 1.7 - 7.7 K/uL 2.9  3.1  2.6   Lymph# 0.7 - 4.0 K/uL 0.8  1.1  1.1        Latest Ref Rng & Units 09/12/2023    9:14 AM 08/15/2023   11:49 AM 07/18/2023   11:39 AM  CMP  Glucose 70 - 99 mg/dL 96  96  89   BUN 8 - 23 mg/dL 19  23  20    Creatinine 0.44 - 1.00 mg/dL 9.00  9.00  8.95   Sodium 135 - 145 mmol/L 137  138  139   Potassium 3.5 - 5.1 mmol/L 3.9  3.9  4.0   Chloride 98 - 111 mmol/L 105  104  107   CO2 22 - 32 mmol/L 25  27  25    Calcium  8.9 - 10.3 mg/dL 9.1  9.0  9.0   Total Protein 6.5 - 8.1  g/dL 6.8  6.8  6.6   Total Bilirubin 0.0 - 1.2 mg/dL 0.4  0.4  0.4   Alkaline Phos 38 - 126 U/L 217  249  221   AST 15 - 41 U/L 40  48  44   ALT 0 - 44 U/L 30  43  40    Lab Results  Component Value Date   MG 1.8 12/01/2021   MG 1.9 11/30/2021   No results found for: [id number]   Contraindications Contraindications were reviewed? Yes Contraindications to therapy were identified? No   Safety Precautions The following safety precautions for the use of lenvatinib were reviewed:  Fever: reviewed the importance of having a thermometer and the Centers for Disease Control and Prevention (CDC) definition of fever which is 100.5F (38C) or higher. Patient should call 24/7 triage at [phone] if experiencing a fever or any other symptoms Abdominal pain Decreased appetite or weight loss Diarrhea (loose and/or urgent bowel movements) Fatigue Headache Increased blood pressure Muscle or joint pain or weakness Nausea or vomiting Stomatitis Take with or without food Bowel perforation Changes in electrolytes and other laboratory values Changes in liver function Chest pain or tightness Harmful to thyroid  Increased risk for bleeding Kidney damage Low calcium  Pain or discomfort in hands and/or feet Posterior reversible leukoencephalopathy syndrome (PRES) -- severe headache, seizures, confusion, changes in vision QT prolongation Surgical or dental procedures Voice changes or hoarseness VTE Missed doses Handing body fluids and waste Pregnancy, sexual activity, and contraception Storage and handling  Medication Reconciliation Current Outpatient Medications  Medication Sig Dispense Refill   Cyanocobalamin  (VITAMIN B 12 PO) Take 1 tablet by mouth.     escitalopram  (LEXAPRO ) 10 MG tablet TAKE 1 TABLET EVERY DAY 90 tablet 0   lenvatinib 10 mg daily dose (LENVIMA) capsule Take 1 capsule (10 mg total) by mouth daily. 30 capsule 11   ondansetron  (ZOFRAN ) 8 MG tablet Take 1 tablet (8 mg  total) by mouth every 8 (eight) hours as needed for nausea or vomiting. Start on the third day after chemotherapy. 30 tablet 1   pantoprazole  (PROTONIX ) 40 MG tablet TAKE 1 TABLET BY MOUTH EVERY DAY 90 tablet 1   prochlorperazine  (COMPAZINE ) 10 MG tablet Take 1 tablet (10 mg total) by mouth every 6 (six) hours as needed for nausea or vomiting. 90 tablet 1   traMADol  (ULTRAM ) 50 MG tablet Take 1 tablet (50 mg total) by mouth every 6 (six) hours as needed. 60 tablet 0  VITAMIN D , CHOLECALCIFEROL, PO Take 1 tablet by mouth daily.     No current facility-administered medications for this visit.    Medication reconciliation is based on the patient's most recent medication list in the electronic medical record (EMR) including herbal products and OTC medications.   The patient's medication list was reviewed today with the patient? Yes   Drug-drug interactions (DDIs) DDIs were evaluated? Yes Significant DDIs identified? Discussed escitalopram  and lenvatinib potential for QTc prolongation with patient and Dr. Lonn. Dr. Lonn will order ECG at baseline and periodically  Drug-Food Interactions Drug-food interactions were evaluated? Yes Drug-food interactions identified? No   Follow-up Plan  Patient education handout given to patient Start lenvatinib 10 mg once daily by mouth. Will start after baseline labs and visit with Dr. Lonn on 10/16/23. Start pembrolizumab 200 mg IV every 3 weeks. Will start 10/16/23 Monitor for side effects Distress thermometer not done. Patient has been on several lines of therapy prior Port flush with labs, pembrolizumab, and Dr. Lonn visit on 10/16/23 Kayla HERO Riner can follow up with clinical pharmacy as deemed necessary by Dr. Almarie Lonn going forward   Kayla HERO Cass participated in the discussion, expressed understanding, and voiced agreement with the above plan. All questions were answered to their satisfaction. The patient was advised to contact the clinic  at [phone] with any questions or concerns prior to their return visit.   I spent 30 minutes assessing the patient.  Anyela Napierkowski A. Lucila, PharmD, BCOP, CPP  Norleen DELENA Lucila, RPH-CPP, 10/09/2023 1:36 PM  **Disclaimer: This note was dictated with voice recognition software. Similar sounding words can inadvertently be transcribed and this note may contain transcription errors which may not have been corrected upon publication of note.**

## 2023-10-09 NOTE — Progress Notes (Signed)
 Specialty Pharmacy Initial Fill Coordination Note  Kayla Mcgrath is a 78 y.o. female contacted today regarding refills of specialty medication(s) Lenvatinib Mesylate (LENVIMA) .  Patient requested Delivery  on 10/13/23  to verified address [address]   [place] [address]   Medication will be filled on 10/12/23.   Patient is aware of $1,941.59  copayment.

## 2023-10-09 NOTE — Progress Notes (Signed)
 Patient counseled in clinic telephone visit note on 10/09/23

## 2023-10-09 NOTE — Telephone Encounter (Signed)
 I created treatment plan to start 10/6. Can you work with Nanetta to schedule first dose next Monday if possible? Thanks

## 2023-10-09 NOTE — Telephone Encounter (Signed)
 Oral Oncology Patient Advocate Encounter  Prior Authorization for  [facility]  has been approved.    PA# [id number] Effective dates: 10/09/23 through 01/10/24  Patients co-pay is $1,941.59.  I will follow up with patient to see if assistance is needed.    Charlott Hamilton,  CPhT-Adv  she/her/hers [facility]  [facility] Pharmacy Technician Patient Advocate Specialist III [facility] Phone: [phone]  Fax: [phone] [email or web address]

## 2023-10-09 NOTE — Telephone Encounter (Signed)
 [facility]    Oncology Clinical Pharmacist Practitioner Initial Assessment  Received new prescription for lenvatinib for the treatment of uterine cancer. This is being given in combination with pembrolizumab 200 mg IV every 3 weeks and is planned to start on 10/16/23. It is planned to continue until disease progression or unacceptable toxicity.  Labs will be done prior to start pembrolizumab and lenvatinib, likely on 10/16/23 but prior to cycle 1, day 1. Prescription dose and frequency assessed.   Current medication list in Epic reviewed. Significant DDIs with lenvatinib identified:Yes. Possible QTc prolongation risk identified with lenvatinib and sertraline and escitalopram . Dr. Lonn and nursing team made aware.   Evaluated chart, patient barriers to medication adherence identified: No.  Patient agreement for treatment documented in MD note on 10/09/23 telephone call and prescription sent same day. Dr. Lonn starting at a reduced dose of lenvatinib of 10 mg by mouth daily.  Prescription has been e-scribed to the [facility]) for benefits analysis and approval.  Oral Oncology Clinic will continue to follow for insurance authorization, copayment issues, initial counseling and start date.  Elio Haden A. Lucila, PharmD, BCOP, CPP Hematology-Oncology Clinical Pharmacist Practitioner  10/09/2023 11:18 AM  **Disclaimer: This note was dictated with voice recognition software. Similar sounding words can inadvertently be transcribed and this note may contain transcription errors which may not have been corrected upon publication of note.**

## 2023-10-09 NOTE — Telephone Encounter (Signed)
 Returned her call. She has decided to get lenvatinib and pembrolizumab as her treatment.

## 2023-10-09 NOTE — Telephone Encounter (Signed)
 Oral Oncology Patient Advocate Encounter   Received notification that prior authorization for LENVIMA is required.   PA submitted on 10/09/23 Key [id number] Status is pending      Charlott Hamilton,  CPhT-Adv  she/her/hers [facility]  [facility] Pharmacy Technician Patient Advocate Specialist III [facility] Phone: [phone]  Fax: [phone] [email or web address]

## 2023-10-09 NOTE — Telephone Encounter (Signed)
 Thanks, will order EKG next week

## 2023-10-09 NOTE — Telephone Encounter (Signed)
 Appointments scheduled for 10/6.

## 2023-10-10 ENCOUNTER — Other Ambulatory Visit

## 2023-10-10 ENCOUNTER — Ambulatory Visit

## 2023-10-10 ENCOUNTER — Ambulatory Visit: Admitting: Hematology and Oncology

## 2023-10-11 ENCOUNTER — Other Ambulatory Visit: Payer: Self-pay

## 2023-10-12 ENCOUNTER — Other Ambulatory Visit: Payer: Self-pay

## 2023-10-16 ENCOUNTER — Other Ambulatory Visit: Payer: Self-pay

## 2023-10-16 ENCOUNTER — Inpatient Hospital Stay

## 2023-10-16 ENCOUNTER — Inpatient Hospital Stay: Attending: Hematology and Oncology

## 2023-10-16 ENCOUNTER — Inpatient Hospital Stay (HOSPITAL_BASED_OUTPATIENT_CLINIC_OR_DEPARTMENT_OTHER): Admitting: Hematology and Oncology

## 2023-10-16 ENCOUNTER — Ambulatory Visit: Payer: Self-pay | Admitting: Hematology and Oncology

## 2023-10-16 ENCOUNTER — Encounter: Payer: Self-pay | Admitting: Hematology and Oncology

## 2023-10-16 VITALS — BP 139/67 | HR 84 | Temp 97.7°F | Resp 18 | Wt 135.5 lb

## 2023-10-16 DIAGNOSIS — Z90710 Acquired absence of both cervix and uterus: Secondary | ICD-10-CM | POA: Insufficient documentation

## 2023-10-16 DIAGNOSIS — C55 Malignant neoplasm of uterus, part unspecified: Secondary | ICD-10-CM | POA: Insufficient documentation

## 2023-10-16 DIAGNOSIS — D61818 Other pancytopenia: Secondary | ICD-10-CM | POA: Insufficient documentation

## 2023-10-16 DIAGNOSIS — Z79620 Long term (current) use of immunosuppressive biologic: Secondary | ICD-10-CM | POA: Diagnosis not present

## 2023-10-16 DIAGNOSIS — Z23 Encounter for immunization: Secondary | ICD-10-CM | POA: Diagnosis not present

## 2023-10-16 DIAGNOSIS — Z171 Estrogen receptor negative status [ER-]: Secondary | ICD-10-CM | POA: Diagnosis not present

## 2023-10-16 DIAGNOSIS — Z5112 Encounter for antineoplastic immunotherapy: Secondary | ICD-10-CM | POA: Diagnosis not present

## 2023-10-16 DIAGNOSIS — R030 Elevated blood-pressure reading, without diagnosis of hypertension: Secondary | ICD-10-CM | POA: Insufficient documentation

## 2023-10-16 LAB — CMP (CANCER CENTER ONLY)
ALT: 34 U/L (ref 0–44)
AST: 42 U/L — ABNORMAL HIGH (ref 15–41)
Albumin: 3.5 g/dL (ref 3.5–5.0)
Alkaline Phosphatase: 221 U/L — ABNORMAL HIGH (ref 38–126)
Anion gap: 7 (ref 5–15)
BUN: 16 mg/dL (ref 8–23)
CO2: 26 mmol/L (ref 22–32)
Calcium: 9.1 mg/dL (ref 8.9–10.3)
Chloride: 107 mmol/L (ref 98–111)
Creatinine: 1 mg/dL (ref 0.44–1.00)
GFR, Estimated: 58 mL/min — ABNORMAL LOW (ref >60)
Glucose, Bld: 107 mg/dL — ABNORMAL HIGH (ref 70–99)
Potassium: 3.3 mmol/L — ABNORMAL LOW (ref 3.5–5.1)
Sodium: 140 mmol/L (ref 135–145)
Total Bilirubin: 0.4 mg/dL (ref 0.0–1.2)
Total Protein: 6.7 g/dL (ref 6.5–8.1)

## 2023-10-16 LAB — CBC WITH DIFFERENTIAL (CANCER CENTER ONLY)
Abs Immature Granulocytes: 0 K/uL (ref 0.00–0.07)
Basophils Absolute: 0 K/uL (ref 0.0–0.1)
Basophils Relative: 1 %
Eosinophils Absolute: 0.2 K/uL (ref 0.0–0.5)
Eosinophils Relative: 5 %
HCT: 32.9 % — ABNORMAL LOW (ref 36.0–46.0)
Hemoglobin: 10.9 g/dL — ABNORMAL LOW (ref 12.0–15.0)
Immature Granulocytes: 0 %
Lymphocytes Relative: 21 %
Lymphs Abs: 0.8 K/uL (ref 0.7–4.0)
MCH: 30.7 pg (ref 26.0–34.0)
MCHC: 33.1 g/dL (ref 30.0–36.0)
MCV: 92.7 fL (ref 80.0–100.0)
Monocytes Absolute: 0.5 K/uL (ref 0.1–1.0)
Monocytes Relative: 12 %
Neutro Abs: 2.3 K/uL (ref 1.7–7.7)
Neutrophils Relative %: 61 %
Platelet Count: 161 K/uL (ref 150–400)
RBC: 3.55 MIL/uL — ABNORMAL LOW (ref 3.87–5.11)
RDW: 17.9 % — ABNORMAL HIGH (ref 11.5–15.5)
WBC Count: 3.9 K/uL — ABNORMAL LOW (ref 4.0–10.5)
nRBC: 0 % (ref 0.0–0.2)

## 2023-10-16 LAB — TSH: TSH: 5.62 u[IU]/mL — ABNORMAL HIGH (ref 0.350–4.500)

## 2023-10-16 MED ORDER — ESCITALOPRAM OXALATE 10 MG PO TABS: 5.0000 mg | ORAL_TABLET | Freq: Every day | ORAL | Status: DC

## 2023-10-16 MED ORDER — SODIUM CHLORIDE 0.9 % IV SOLN
200.0000 mg | Freq: Once | INTRAVENOUS | Status: AC
  Administered 2023-10-16: 200 mg via INTRAVENOUS
  Filled 2023-10-16: qty 200

## 2023-10-16 MED ORDER — TRAMADOL HCL 50 MG PO TABS: 50.0000 mg | ORAL_TABLET | Freq: Four times a day (QID) | ORAL | 0 refills | Status: AC | PRN

## 2023-10-16 MED ORDER — INFLUENZA VAC SPLIT HIGH-DOSE 0.5 ML IM SUSY
0.5000 mL | PREFILLED_SYRINGE | Freq: Once | INTRAMUSCULAR | Status: AC
  Administered 2023-10-16: 0.5 mL via INTRAMUSCULAR
  Filled 2023-10-16: qty 0.5

## 2023-10-16 MED ORDER — SODIUM CHLORIDE 0.9 % IV SOLN: INTRAVENOUS | Status: DC

## 2023-10-16 NOTE — Assessment & Plan Note (Addendum)
 She has chronic intermittent pancytopenia due to treatment She is not symptomatic We will proceed with treatment without delay

## 2023-10-16 NOTE — Assessment & Plan Note (Addendum)
 She initially presented with postmenopausal bleeding in September 2023, s/p hysterectomy with tumor debulking but lymph nodes were not resectable Final pathology reviewed carcinosarcoma: MMR normal, MSI stable, PD-L1 CPS: 1%, Her2 positive, P53 +, Neg genetics, ER negative  She was treated with combination of carboplatin , paclitaxel  and trastuzumab , completed by August 2024 with disease progression, treatment was subsequently changed to trastuzumab  Deruxtecan with positive response to therapy CT imaging study from May 2025 showed stable disease control but unfortunately, most recent CT imaging from September 2025 showed disease progression with new solid nodule in the right upper lobe worrisome for possible metastatic disease Overall, she has initial primary refractory disease to carboplatin , paclitaxel , trastuzumab  and now have resistant disease to trastuzumab  Deruxtecan The patient ultimately wants to proceed with further palliative chemotherapy She will start cycle 1 of pembrolizumab with lenvatinib I reviewed baseline EKG Her baseline QTc interval is borderline satisfactory She is aware of potential interaction between citalopram and Lenvima and is in agreement to try to reduce her citalopram to 5 mg We will monitor her EKG periodically She is aware that she needs to check her blood pressure on a regular basis and to contact me if her systolic blood pressure is greater than 150 or diastolic blood pressure greater than 100

## 2023-10-16 NOTE — Patient Instructions (Signed)

## 2023-10-16 NOTE — Progress Notes (Signed)
 [facility] OFFICE PROGRESS NOTE  Patient Care Team: Lonn Hicks, MD as PCP - General (Hematology and Oncology) Watt Mirza, MD as Consulting Physician (Family Medicine)  Assessment & Plan Malignant neoplasm of uterus, unspecified site [facility]) She initially presented with postmenopausal bleeding in September 2023, s/p hysterectomy with tumor debulking but lymph nodes were not resectable Final pathology reviewed carcinosarcoma: MMR normal, MSI stable, PD-L1 CPS: 1%, Her2 positive, P53 +, Neg genetics, ER negative  She was treated with combination of carboplatin , paclitaxel  and trastuzumab , completed by August 2024 with disease progression, treatment was subsequently changed to trastuzumab  Deruxtecan with positive response to therapy CT imaging study from May 2025 showed stable disease control but unfortunately, most recent CT imaging from September 2025 showed disease progression with new solid nodule in the right upper lobe worrisome for possible metastatic disease Overall, she has initial primary refractory disease to carboplatin , paclitaxel , trastuzumab  and now have resistant disease to trastuzumab  Deruxtecan The patient ultimately wants to proceed with further palliative chemotherapy She will start cycle 1 of pembrolizumab with lenvatinib I reviewed baseline EKG Her baseline QTc interval is borderline satisfactory She is aware of potential interaction between citalopram and Lenvima and is in agreement to try to reduce her citalopram to 5 mg We will monitor her EKG periodically She is aware that she needs to check her blood pressure on a regular basis and to contact me if her systolic blood pressure is greater than 150 or diastolic blood pressure greater than 100 Pancytopenia, acquired (HCC) She has chronic intermittent pancytopenia due to treatment She is not symptomatic We will proceed with treatment without delay Elevated BP without diagnosis of hypertension Blood  pressure is generally high here but normal at home We will monitor carefully  No orders of the defined types were placed in this encounter.    Hicks Lonn, MD  INTERVAL HISTORY: she returns for treatment follow-up Complications related to previous cycle of chemotherapy included pancytopenia, and elevated BP She is starting cycle 1 of treatment today We discussed potential drug interaction and she is in agreement to try to wean herself off Lexapro  if possible She has minor musculoskeletal pain and I refilled her tramadol  today  PHYSICAL EXAMINATION: ECOG PERFORMANCE STATUS: 1 - Symptomatic but completely ambulatory  Lab Results  Component Value Date   CAN125 8.3 03/21/2023   CAN125 6.5 02/28/2023   CAN125 8.4 01/17/2023      Latest Ref Rng & Units 10/16/2023    8:08 AM 09/12/2023    9:14 AM 08/15/2023   11:49 AM  CBC  WBC 4.0 - 10.5 K/uL 3.9  4.7  5.1   Hemoglobin 12.0 - 15.0 g/dL 89.0  89.0  89.2   Hematocrit 36.0 - 46.0 % 32.9  33.2  32.0   Platelets 150 - 400 K/uL 161  177  182       Chemistry      Component Value Date/Time   NA 140 10/16/2023 0808   NA 140 11/02/2016 1130   K 3.3 (L) 10/16/2023 0808   K 3.8 11/02/2016 1130   CL 107 10/16/2023 0808   CL 105 05/02/2012 0822   CO2 26 10/16/2023 0808   CO2 26 11/02/2016 1130   BUN 16 10/16/2023 0808   BUN 14.7 11/02/2016 1130   CREATININE 1.00 10/16/2023 0808   CREATININE 0.84 12/05/2018 1014   CREATININE 0.8 11/02/2016 1130      Component Value Date/Time   CALCIUM  9.1 10/16/2023 0808   CALCIUM  9.7 11/02/2016  1130   ALKPHOS 221 (H) 10/16/2023 0808   ALKPHOS 59 11/02/2016 1130   AST 42 (H) 10/16/2023 0808   AST 23 11/02/2016 1130   ALT 34 10/16/2023 0808   ALT 17 11/02/2016 1130   BILITOT 0.4 10/16/2023 0808   BILITOT 0.34 11/02/2016 1130       There were no vitals filed for this visit. There were no vitals filed for this visit. Other relevant data reviewed during this visit included CBC, CMP, EKG

## 2023-10-16 NOTE — Telephone Encounter (Signed)
 Called and left a message asking her to call the office back.

## 2023-10-16 NOTE — Assessment & Plan Note (Addendum)
Blood pressure is generally high here but normal at home We will monitor carefully

## 2023-10-17 ENCOUNTER — Other Ambulatory Visit: Payer: Self-pay

## 2023-10-17 ENCOUNTER — Other Ambulatory Visit: Payer: Self-pay | Admitting: Hematology and Oncology

## 2023-10-17 ENCOUNTER — Encounter: Payer: Self-pay | Admitting: Hematology and Oncology

## 2023-10-17 LAB — T4: T4, Total: 7.8 ug/dL (ref 4.5–12.0)

## 2023-10-17 NOTE — Telephone Encounter (Signed)
 Called to check on pt to see how she did with her recent treatment.  Left message to return call to Dr Lonn RN.

## 2023-10-17 NOTE — Telephone Encounter (Signed)
 Returned her call and left another message asking her to call the office back.

## 2023-10-17 NOTE — Telephone Encounter (Signed)
 Called her and given below message. She verbalized understanding. She does not want to start Synthroid and wants to wait until the next lab appt to see lab results. She feels that it is to many medications to start at the same time.

## 2023-10-17 NOTE — Telephone Encounter (Signed)
-----   Message from Nurse Cortney P sent at 10/16/2023 10:29 AM EDT ----- Regarding: Dr Lonn, first time [facility] Lonn patient. First time [name] (pt has had other treatments in the past). Patient tolerated well, no complaints

## 2023-10-20 ENCOUNTER — Inpatient Hospital Stay

## 2023-10-20 NOTE — Progress Notes (Signed)
 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 Pt reports she is tolerating tx well and has more energy than anticipated. This has allowed her to engage in enjoyable activities such as working in her garden and preparing meals. Pt reports mood has been low recently in anticipation of new tx, but she has experienced reduced stress since making a decision about additional medication management, and has reduced dosage of Lexapro  without negative side effects. Pt expressed that at the last Therapeutic Horticulture session at the [facility], the guided meditation was beneficial. Pt and Intern agreed to include meditations in future garden sessions.     Follow Up Plan:  CSW will see patient on 10/27/23 at next Therapeutic Horticulture session.    Thersia KATHEE Daring Clinical Social Work Intern [facility]

## 2023-10-23 ENCOUNTER — Other Ambulatory Visit: Payer: Self-pay

## 2023-10-27 ENCOUNTER — Encounter: Payer: Self-pay | Admitting: Hematology and Oncology

## 2023-10-27 ENCOUNTER — Other Ambulatory Visit: Payer: Self-pay

## 2023-11-06 ENCOUNTER — Other Ambulatory Visit: Payer: Self-pay

## 2023-11-07 ENCOUNTER — Encounter: Payer: Self-pay | Admitting: Hematology and Oncology

## 2023-11-07 ENCOUNTER — Other Ambulatory Visit

## 2023-11-07 ENCOUNTER — Inpatient Hospital Stay

## 2023-11-07 ENCOUNTER — Ambulatory Visit

## 2023-11-07 ENCOUNTER — Other Ambulatory Visit: Payer: Self-pay

## 2023-11-07 ENCOUNTER — Ambulatory Visit: Admitting: Hematology and Oncology

## 2023-11-07 ENCOUNTER — Inpatient Hospital Stay (HOSPITAL_BASED_OUTPATIENT_CLINIC_OR_DEPARTMENT_OTHER): Admitting: Hematology and Oncology

## 2023-11-07 VITALS — BP 156/79 | HR 77 | Temp 97.6°F | Resp 16 | Wt 134.1 lb

## 2023-11-07 DIAGNOSIS — N183 Chronic kidney disease, stage 3 unspecified: Secondary | ICD-10-CM

## 2023-11-07 DIAGNOSIS — C55 Malignant neoplasm of uterus, part unspecified: Secondary | ICD-10-CM

## 2023-11-07 DIAGNOSIS — R7989 Other specified abnormal findings of blood chemistry: Secondary | ICD-10-CM | POA: Diagnosis not present

## 2023-11-07 DIAGNOSIS — Z23 Encounter for immunization: Secondary | ICD-10-CM | POA: Diagnosis not present

## 2023-11-07 DIAGNOSIS — Z5112 Encounter for antineoplastic immunotherapy: Secondary | ICD-10-CM | POA: Diagnosis not present

## 2023-11-07 DIAGNOSIS — D61818 Other pancytopenia: Secondary | ICD-10-CM | POA: Diagnosis not present

## 2023-11-07 DIAGNOSIS — D696 Thrombocytopenia, unspecified: Secondary | ICD-10-CM | POA: Diagnosis not present

## 2023-11-07 DIAGNOSIS — Z90710 Acquired absence of both cervix and uterus: Secondary | ICD-10-CM | POA: Diagnosis not present

## 2023-11-07 DIAGNOSIS — Z79620 Long term (current) use of immunosuppressive biologic: Secondary | ICD-10-CM | POA: Diagnosis not present

## 2023-11-07 DIAGNOSIS — R030 Elevated blood-pressure reading, without diagnosis of hypertension: Secondary | ICD-10-CM | POA: Diagnosis not present

## 2023-11-07 DIAGNOSIS — Z171 Estrogen receptor negative status [ER-]: Secondary | ICD-10-CM | POA: Diagnosis not present

## 2023-11-07 LAB — CBC WITH DIFFERENTIAL (CANCER CENTER ONLY)
Abs Immature Granulocytes: 0.01 K/uL (ref 0.00–0.07)
Basophils Absolute: 0.1 K/uL (ref 0.0–0.1)
Basophils Relative: 1 %
Eosinophils Absolute: 0.2 K/uL (ref 0.0–0.5)
Eosinophils Relative: 5 %
HCT: 36.5 % (ref 36.0–46.0)
Hemoglobin: 12 g/dL (ref 12.0–15.0)
Immature Granulocytes: 0 %
Lymphocytes Relative: 23 %
Lymphs Abs: 0.9 K/uL (ref 0.7–4.0)
MCH: 29.7 pg (ref 26.0–34.0)
MCHC: 32.9 g/dL (ref 30.0–36.0)
MCV: 90.3 fL (ref 80.0–100.0)
Monocytes Absolute: 0.5 K/uL (ref 0.1–1.0)
Monocytes Relative: 12 %
Neutro Abs: 2.3 K/uL (ref 1.7–7.7)
Neutrophils Relative %: 59 %
Platelet Count: 144 K/uL — ABNORMAL LOW (ref 150–400)
RBC: 4.04 MIL/uL (ref 3.87–5.11)
RDW: 16.5 % — ABNORMAL HIGH (ref 11.5–15.5)
WBC Count: 4 K/uL (ref 4.0–10.5)
nRBC: 0 % (ref 0.0–0.2)

## 2023-11-07 LAB — CMP (CANCER CENTER ONLY)
ALT: 25 U/L (ref 0–44)
AST: 35 U/L (ref 15–41)
Albumin: 3.3 g/dL — ABNORMAL LOW (ref 3.5–5.0)
Alkaline Phosphatase: 209 U/L — ABNORMAL HIGH (ref 38–126)
Anion gap: 8 (ref 5–15)
BUN: 14 mg/dL (ref 8–23)
CO2: 24 mmol/L (ref 22–32)
Calcium: 9 mg/dL (ref 8.9–10.3)
Chloride: 106 mmol/L (ref 98–111)
Creatinine: 1.07 mg/dL — ABNORMAL HIGH (ref 0.44–1.00)
GFR, Estimated: 54 mL/min — ABNORMAL LOW (ref >60)
Glucose, Bld: 106 mg/dL — ABNORMAL HIGH (ref 70–99)
Potassium: 3.7 mmol/L (ref 3.5–5.1)
Sodium: 138 mmol/L (ref 135–145)
Total Bilirubin: 0.5 mg/dL (ref 0.0–1.2)
Total Protein: 7.1 g/dL (ref 6.5–8.1)

## 2023-11-07 MED ORDER — SODIUM CHLORIDE 0.9 % IV SOLN: INTRAVENOUS | Status: DC

## 2023-11-07 MED ORDER — SODIUM CHLORIDE 0.9 % IV SOLN
200.0000 mg | Freq: Once | INTRAVENOUS | Status: AC
  Administered 2023-11-07: 200 mg via INTRAVENOUS
  Filled 2023-11-07: qty 200

## 2023-11-07 NOTE — Assessment & Plan Note (Addendum)
 She initially presented with postmenopausal bleeding in September 2023, s/p hysterectomy with tumor debulking but lymph nodes were not resectable Final pathology reviewed carcinosarcoma: MMR normal, MSI stable, PD-L1 CPS: 1%, Her2 positive, P53 +, Neg genetics, ER negative  She was treated with combination of carboplatin , paclitaxel  and trastuzumab , completed by August 2024 with disease progression, treatment was subsequently changed to trastuzumab  Deruxtecan with positive response to therapy CT imaging study from May 2025 showed stable disease control but unfortunately, most recent CT imaging from September 2025 showed disease progression with new solid nodule in the right upper lobe worrisome for possible metastatic disease Overall, she has initial primary refractory disease to carboplatin , paclitaxel , trastuzumab  and now have resistant disease to trastuzumab  Deruxtecan The patient ultimately wants to proceed with further palliative chemotherapy  She tolerated treatment with combination of pembrolizumab and lenvatinib well I reviewed baseline EKG Her baseline QTc interval is borderline satisfactory She is aware of potential interaction between citalopram and Lenvima and is in agreement to try to reduce her citalopram to 5 mg We will monitor her EKG periodically We discussed recent abnormal thyroid  function The plan will be to continue treatment for minimum 3 months before repeating imaging

## 2023-11-07 NOTE — Progress Notes (Signed)
 Specialty Pharmacy Refill Coordination Note  Kayla Mcgrath is a 78 y.o. female contacted today regarding refills of specialty medication(s) Lenvatinib Mesylate (LENVIMA)   Patient requested Delivery   Delivery date: 11/10/23   Verified address: [address]   [place] [address]   Medication will be filled on: 11/09/23

## 2023-11-07 NOTE — Assessment & Plan Note (Addendum)
 She was recently started on low-dose Synthroid Will monitor TSH while on treatment with checkpoint inhibitors

## 2023-11-07 NOTE — Progress Notes (Signed)
 [facility] OFFICE PROGRESS NOTE  Patient Care Team: Kayla Hicks, MD as PCP - General (Hematology and Oncology) Watt Mirza, MD as Consulting Physician (Family Medicine)  Assessment & Plan Malignant neoplasm of uterus, unspecified site [facility]) She initially presented with postmenopausal bleeding in September 2023, s/p hysterectomy with tumor debulking but lymph nodes were not resectable Final pathology reviewed carcinosarcoma: MMR normal, MSI stable, PD-L1 CPS: 1%, Her2 positive, P53 +, Neg genetics, ER negative  She was treated with combination of carboplatin , paclitaxel  and trastuzumab , completed by August 2024 with disease progression, treatment was subsequently changed to trastuzumab  Deruxtecan with positive response to therapy CT imaging study from May 2025 showed stable disease control but unfortunately, most recent CT imaging from September 2025 showed disease progression with new solid nodule in the right upper lobe worrisome for possible metastatic disease Overall, she has initial primary refractory disease to carboplatin , paclitaxel , trastuzumab  and now have resistant disease to trastuzumab  Deruxtecan The patient ultimately wants to proceed with further palliative chemotherapy  She tolerated treatment with combination of pembrolizumab and lenvatinib well I reviewed baseline EKG Her baseline QTc interval is borderline satisfactory She is aware of potential interaction between citalopram and Lenvima and is in agreement to try to reduce her citalopram to 5 mg We will monitor her EKG periodically We discussed recent abnormal thyroid  function The plan will be to continue treatment for minimum 3 months before repeating imaging Stage 3 chronic kidney disease, unspecified whether stage 3a or 3b CKD (HCC) She has history of intermittent kidney failure We will monitor that closely Thrombocytopenia This is likely due to recent treatment. The patient denies recent history  of bleeding such as epistaxis, hematuria or hematochezia. She is asymptomatic from the low platelet count. I will observe for now.  she does not require transfusion now. I will continue the chemotherapy at current dose without dosage adjustment.  If the thrombocytopenia gets progressive worse in the future, I might have to delay her treatment or adjust the chemotherapy dose.  Elevated TSH She was recently started on low-dose Synthroid Will monitor TSH while on treatment with checkpoint inhibitors  No orders of the defined types were placed in this encounter.    Mcgrath Lonn, MD  INTERVAL HISTORY: she returns for treatment follow-up Complications related to previous cycle of chemotherapy included thrombocytopenia,, abnormal thyroid  function, and elevated serum creatinine  PHYSICAL EXAMINATION: ECOG PERFORMANCE STATUS: 1 - Symptomatic but completely ambulatory  Lab Results  Component Value Date   CAN125 8.3 03/21/2023   CAN125 6.5 02/28/2023   CAN125 8.4 01/17/2023      Latest Ref Rng & Units 11/07/2023    8:43 AM 10/16/2023    8:08 AM 09/12/2023    9:14 AM  CBC  WBC 4.0 - 10.5 K/uL 4.0  3.9  4.7   Hemoglobin 12.0 - 15.0 g/dL 87.9  89.0  89.0   Hematocrit 36.0 - 46.0 % 36.5  32.9  33.2   Platelets 150 - 400 K/uL 144  161  177       Chemistry      Component Value Date/Time   NA 138 11/07/2023 0843   NA 140 11/02/2016 1130   K 3.7 11/07/2023 0843   K 3.8 11/02/2016 1130   CL 106 11/07/2023 0843   CL 105 05/02/2012 0822   CO2 24 11/07/2023 0843   CO2 26 11/02/2016 1130   BUN 14 11/07/2023 0843   BUN 14.7 11/02/2016 1130   CREATININE 1.07 (H) 11/07/2023 9156  CREATININE 0.84 12/05/2018 1014   CREATININE 0.8 11/02/2016 1130      Component Value Date/Time   CALCIUM  9.0 11/07/2023 0843   CALCIUM  9.7 11/02/2016 1130   ALKPHOS 209 (H) 11/07/2023 0843   ALKPHOS 59 11/02/2016 1130   AST 35 11/07/2023 0843   AST 23 11/02/2016 1130   ALT 25 11/07/2023 0843   ALT 17 11/02/2016  1130   BILITOT 0.5 11/07/2023 0843   BILITOT 0.34 11/02/2016 1130       There were no vitals filed for this visit. There were no vitals filed for this visit. Other relevant data reviewed during this visit included CBC, CMP, TSH

## 2023-11-07 NOTE — Assessment & Plan Note (Addendum)
This is likely due to recent treatment. The patient denies recent history of bleeding such as epistaxis, hematuria or hematochezia. She is asymptomatic from the low platelet count. I will observe for now.  she does not require transfusion now. I will continue the chemotherapy at current dose without dosage adjustment.  If the thrombocytopenia gets progressive worse in the future, I might have to delay her treatment or adjust the chemotherapy dose.   

## 2023-11-07 NOTE — Assessment & Plan Note (Addendum)
 She has history of intermittent kidney failure We will monitor that closely

## 2023-11-07 NOTE — Patient Instructions (Signed)

## 2023-11-07 NOTE — Progress Notes (Signed)
 Specialty Pharmacy Ongoing Clinical Assessment Note  Kayla Mcgrath is a 78 y.o. female who is being followed by the specialty pharmacy service for RxSp Oncology   Patient's specialty medication(s) reviewed today: Lenvatinib Mesylate (LENVIMA)   Missed doses in the last 4 weeks: 0   Patient/Caregiver did not have any additional questions or concerns.   Therapeutic benefit summary: Unable to assess   Adverse events/side effects summary: No adverse events/side effects   Patient's therapy is appropriate to: Continue    Goals Addressed             This Visit's Progress    Maintain optimal adherence to therapy   On track    Patient is on track. Patient will maintain adherence         Follow up: 3 months  [facility] Specialty Pharmacist

## 2023-11-08 ENCOUNTER — Other Ambulatory Visit: Payer: Self-pay

## 2023-11-08 DIAGNOSIS — H5203 Hypermetropia, bilateral: Secondary | ICD-10-CM | POA: Diagnosis not present

## 2023-11-08 DIAGNOSIS — Z135 Encounter for screening for eye and ear disorders: Secondary | ICD-10-CM | POA: Diagnosis not present

## 2023-11-08 DIAGNOSIS — H524 Presbyopia: Secondary | ICD-10-CM | POA: Diagnosis not present

## 2023-11-08 DIAGNOSIS — H52223 Regular astigmatism, bilateral: Secondary | ICD-10-CM | POA: Diagnosis not present

## 2023-11-08 DIAGNOSIS — Z961 Presence of intraocular lens: Secondary | ICD-10-CM | POA: Diagnosis not present

## 2023-11-09 ENCOUNTER — Other Ambulatory Visit: Payer: Self-pay

## 2023-11-11 DIAGNOSIS — H52223 Regular astigmatism, bilateral: Secondary | ICD-10-CM | POA: Diagnosis not present

## 2023-11-11 DIAGNOSIS — H524 Presbyopia: Secondary | ICD-10-CM | POA: Diagnosis not present

## 2023-11-18 ENCOUNTER — Other Ambulatory Visit: Payer: Self-pay | Admitting: Hematology and Oncology

## 2023-11-20 ENCOUNTER — Encounter: Payer: Self-pay | Admitting: Hematology and Oncology

## 2023-11-28 ENCOUNTER — Other Ambulatory Visit: Payer: Self-pay

## 2023-11-28 ENCOUNTER — Telehealth: Payer: Self-pay

## 2023-11-28 ENCOUNTER — Encounter: Payer: Self-pay | Admitting: Hematology and Oncology

## 2023-11-28 ENCOUNTER — Inpatient Hospital Stay

## 2023-11-28 ENCOUNTER — Inpatient Hospital Stay: Admitting: Hematology and Oncology

## 2023-11-28 ENCOUNTER — Inpatient Hospital Stay: Attending: Hematology and Oncology

## 2023-11-28 VITALS — BP 150/88 | HR 82 | Temp 97.0°F | Resp 18 | Ht 59.0 in | Wt 129.2 lb

## 2023-11-28 DIAGNOSIS — Z5112 Encounter for antineoplastic immunotherapy: Secondary | ICD-10-CM | POA: Diagnosis not present

## 2023-11-28 DIAGNOSIS — D61818 Other pancytopenia: Secondary | ICD-10-CM | POA: Diagnosis not present

## 2023-11-28 DIAGNOSIS — Z90710 Acquired absence of both cervix and uterus: Secondary | ICD-10-CM | POA: Diagnosis not present

## 2023-11-28 DIAGNOSIS — Z79620 Long term (current) use of immunosuppressive biologic: Secondary | ICD-10-CM | POA: Insufficient documentation

## 2023-11-28 DIAGNOSIS — T451X5A Adverse effect of antineoplastic and immunosuppressive drugs, initial encounter: Secondary | ICD-10-CM | POA: Insufficient documentation

## 2023-11-28 DIAGNOSIS — C55 Malignant neoplasm of uterus, part unspecified: Secondary | ICD-10-CM

## 2023-11-28 DIAGNOSIS — G620 Drug-induced polyneuropathy: Secondary | ICD-10-CM

## 2023-11-28 DIAGNOSIS — Z171 Estrogen receptor negative status [ER-]: Secondary | ICD-10-CM | POA: Diagnosis not present

## 2023-11-28 LAB — CBC WITH DIFFERENTIAL (CANCER CENTER ONLY)
Abs Immature Granulocytes: 0.01 K/uL (ref 0.00–0.07)
Basophils Absolute: 0 K/uL (ref 0.0–0.1)
Basophils Relative: 1 %
Eosinophils Absolute: 0.2 K/uL (ref 0.0–0.5)
Eosinophils Relative: 5 %
HCT: 37.2 % (ref 36.0–46.0)
Hemoglobin: 12.3 g/dL (ref 12.0–15.0)
Immature Granulocytes: 0 %
Lymphocytes Relative: 25 %
Lymphs Abs: 0.9 K/uL (ref 0.7–4.0)
MCH: 29.7 pg (ref 26.0–34.0)
MCHC: 33.1 g/dL (ref 30.0–36.0)
MCV: 89.9 fL (ref 80.0–100.0)
Monocytes Absolute: 0.4 K/uL (ref 0.1–1.0)
Monocytes Relative: 11 %
Neutro Abs: 2 K/uL (ref 1.7–7.7)
Neutrophils Relative %: 58 %
Platelet Count: 125 K/uL — ABNORMAL LOW (ref 150–400)
RBC: 4.14 MIL/uL (ref 3.87–5.11)
RDW: 16.8 % — ABNORMAL HIGH (ref 11.5–15.5)
WBC Count: 3.4 K/uL — ABNORMAL LOW (ref 4.0–10.5)
nRBC: 0 % (ref 0.0–0.2)

## 2023-11-28 LAB — CMP (CANCER CENTER ONLY)
ALT: 25 U/L (ref 0–44)
AST: 37 U/L (ref 15–41)
Albumin: 3.3 g/dL — ABNORMAL LOW (ref 3.5–5.0)
Alkaline Phosphatase: 195 U/L — ABNORMAL HIGH (ref 38–126)
Anion gap: 8 (ref 5–15)
BUN: 16 mg/dL (ref 8–23)
CO2: 23 mmol/L (ref 22–32)
Calcium: 8.9 mg/dL (ref 8.9–10.3)
Chloride: 107 mmol/L (ref 98–111)
Creatinine: 0.98 mg/dL (ref 0.44–1.00)
GFR, Estimated: 60 mL/min — ABNORMAL LOW (ref >60)
Glucose, Bld: 98 mg/dL (ref 70–99)
Potassium: 3.7 mmol/L (ref 3.5–5.1)
Sodium: 138 mmol/L (ref 135–145)
Total Bilirubin: 0.5 mg/dL (ref 0.0–1.2)
Total Protein: 6.8 g/dL (ref 6.5–8.1)

## 2023-11-28 LAB — VITAMIN B12: Vitamin B-12: 3432 pg/mL — ABNORMAL HIGH (ref 180–914)

## 2023-11-28 LAB — TSH: TSH: 13.7 u[IU]/mL — ABNORMAL HIGH (ref 0.350–4.500)

## 2023-11-28 MED ORDER — SODIUM CHLORIDE 0.9 % IV SOLN: INTRAVENOUS | Status: DC

## 2023-11-28 MED ORDER — SODIUM CHLORIDE 0.9 % IV SOLN
200.0000 mg | Freq: Once | INTRAVENOUS | Status: AC
  Administered 2023-11-28: 200 mg via INTRAVENOUS
  Filled 2023-11-28: qty 200

## 2023-11-28 MED ORDER — LEVOTHYROXINE SODIUM 50 MCG PO TABS: 50.0000 ug | ORAL_TABLET | Freq: Every day | ORAL | 0 refills | Status: DC

## 2023-11-28 NOTE — Patient Instructions (Signed)
 [facility] - A DEPT OF [facility]  Discharge Instructions: Thank you for choosing [facility] to provide your oncology and hematology care.   If you have a lab appointment with the Cancer Center, please go directly to the Cancer Center and check in at the registration area.   Wear comfortable clothing and clothing appropriate for easy access to any Portacath or PICC line.   We strive to give you quality time with your provider. You may need to reschedule your appointment if you arrive late (15 or more minutes).  Arriving late affects you and other patients whose appointments are after yours.  Also, if you miss three or more appointments without notifying the office, you may be dismissed from the clinic at the provider's discretion.      For prescription refill requests, have your pharmacy contact our office and allow 72 hours for refills to be completed.    Today you received the following chemotherapy and/or immunotherapy agents: pembrolizumab      To help prevent nausea and vomiting after your treatment, we encourage you to take your nausea medication as directed.  BELOW ARE SYMPTOMS THAT SHOULD BE REPORTED IMMEDIATELY: *FEVER GREATER THAN 100.4 F (38 C) OR HIGHER *CHILLS OR SWEATING *NAUSEA AND VOMITING THAT IS NOT CONTROLLED WITH YOUR NAUSEA MEDICATION *UNUSUAL SHORTNESS OF BREATH *UNUSUAL BRUISING OR BLEEDING *URINARY PROBLEMS (pain or burning when urinating, or frequent urination) *BOWEL PROBLEMS (unusual diarrhea, constipation, pain near the anus) TENDERNESS IN MOUTH AND THROAT WITH OR WITHOUT PRESENCE OF ULCERS (sore throat, sores in mouth, or a toothache) UNUSUAL RASH, SWELLING OR PAIN  UNUSUAL VAGINAL DISCHARGE OR ITCHING   Items with * indicate a potential emergency and should be followed up as soon as possible or go to the Emergency Department if any problems should occur.  Please show the CHEMOTHERAPY ALERT CARD or  IMMUNOTHERAPY ALERT CARD at check-in to the Emergency Department and triage nurse.  Should you have questions after your visit or need to cancel or reschedule your appointment, please contact [facility] - A DEPT OF [facility]  Dept: [phone]  and follow the prompts.  Office hours are 8:00 a.m. to 4:30 p.m. Monday - Friday. Please note that voicemails left after 4:00 p.m. may not be returned until the following business day.  We are closed weekends and major holidays. You have access to a nurse at all times for urgent questions. Please call the main number to the clinic Dept: [phone] and follow the prompts.   For any non-urgent questions, you may also contact your provider using MyChart. We now offer e-Visits for anyone 78 and older to request care online for non-urgent symptoms. For details visit [email or web address].   Also download the MyChart app! Go to the app store, search "MyChart", open the app, select [place], and log in with your MyChart username and password.

## 2023-11-28 NOTE — Assessment & Plan Note (Addendum)
 She noted slight worsening neuropathy affecting her gait I will order vitamin B12 level I recommend physical therapy referral

## 2023-11-28 NOTE — Progress Notes (Signed)
 [facility] OFFICE PROGRESS NOTE  Patient Care Team: Lonn Hicks, MD as PCP - General (Hematology and Oncology) Watt Mirza, MD as Consulting Physician (Family Medicine)  Assessment & Plan Pancytopenia, acquired [facility]) She has chronic intermittent pancytopenia due to treatment I will order repeat vitamin B12 level to assess Peripheral neuropathy due to chemotherapy She noted slight worsening neuropathy affecting her gait I will order vitamin B12 level I recommend physical therapy referral Malignant neoplasm of uterus, unspecified site [facility]) She initially presented with postmenopausal bleeding in September 2023, s/p hysterectomy with tumor debulking but lymph nodes were not resectable Final pathology reviewed carcinosarcoma: MMR normal, MSI stable, PD-L1 CPS: 1%, Her2 positive, P53 +, Neg genetics, ER negative  She was treated with combination of carboplatin , paclitaxel  and trastuzumab , completed by August 2024 with disease progression, treatment was subsequently changed to trastuzumab  Deruxtecan with positive response to therapy CT imaging study from May 2025 showed stable disease control but unfortunately, most recent CT imaging from September 2025 showed disease progression with new solid nodule in the right upper lobe worrisome for possible metastatic disease Overall, she has initial primary refractory disease to carboplatin , paclitaxel , trastuzumab  and now have resistant disease to trastuzumab  Deruxtecan The patient ultimately wants to proceed with further palliative chemotherapy  She tolerated treatment with combination of pembrolizumab and lenvatinib well I reviewed baseline EKG Her baseline QTc interval is borderline satisfactory The plan will be to continue treatment for minimum 3 months before repeating imaging  Orders Placed This Encounter  Procedures   Vitamin B12    Standing Status:   Future    Number of Occurrences:   1    Expiration Date:   11/27/2024    Ambulatory referral to Physical Therapy    Referral Priority:   Routine    Referral Type:   Physical Medicine    Referral Reason:   Specialty Services Required    Requested Specialty:   Physical Therapy    Number of Visits Requested:   1     Hicks Lonn, MD  INTERVAL HISTORY: she returns for treatment follow-up Complications related to previous cycle of chemotherapy included pancytopenia,, peripheral neuropathy,, and gait abnormalities  PHYSICAL EXAMINATION: ECOG PERFORMANCE STATUS: 1 - Symptomatic but completely ambulatory  Lab Results  Component Value Date   CAN125 8.3 03/21/2023   CAN125 6.5 02/28/2023   CAN125 8.4 01/17/2023      Latest Ref Rng & Units 11/28/2023    9:06 AM 11/07/2023    8:43 AM 10/16/2023    8:08 AM  CBC  WBC 4.0 - 10.5 K/uL 3.4  4.0  3.9   Hemoglobin 12.0 - 15.0 g/dL 87.6  87.9  89.0   Hematocrit 36.0 - 46.0 % 37.2  36.5  32.9   Platelets 150 - 400 K/uL 125  144  161       Chemistry      Component Value Date/Time   NA 138 11/28/2023 0906   NA 140 11/02/2016 1130   K 3.7 11/28/2023 0906   K 3.8 11/02/2016 1130   CL 107 11/28/2023 0906   CL 105 05/02/2012 0822   CO2 23 11/28/2023 0906   CO2 26 11/02/2016 1130   BUN 16 11/28/2023 0906   BUN 14.7 11/02/2016 1130   CREATININE 0.98 11/28/2023 0906   CREATININE 0.84 12/05/2018 1014   CREATININE 0.8 11/02/2016 1130      Component Value Date/Time   CALCIUM  8.9 11/28/2023 0906   CALCIUM  9.7 11/02/2016 1130   ALKPHOS  195 (H) 11/28/2023 0906   ALKPHOS 59 11/02/2016 1130   AST 37 11/28/2023 0906   AST 23 11/02/2016 1130   ALT 25 11/28/2023 0906   ALT 17 11/02/2016 1130   BILITOT 0.5 11/28/2023 0906   BILITOT 0.34 11/02/2016 1130       Vitals:   11/28/23 0940  BP: (!) 150/88  Pulse: 82  Resp: 18  Temp: (!) 97 F (36.1 C)   Filed Weights   11/28/23 0940  Weight: 129 lb 3.2 oz (58.6 kg)   Other relevant data reviewed during this visit included CBC, CMP

## 2023-11-28 NOTE — Assessment & Plan Note (Addendum)
 She initially presented with postmenopausal bleeding in September 2023, s/p hysterectomy with tumor debulking but lymph nodes were not resectable Final pathology reviewed carcinosarcoma: MMR normal, MSI stable, PD-L1 CPS: 1%, Her2 positive, P53 +, Neg genetics, ER negative  She was treated with combination of carboplatin , paclitaxel  and trastuzumab , completed by August 2024 with disease progression, treatment was subsequently changed to trastuzumab  Deruxtecan with positive response to therapy CT imaging study from May 2025 showed stable disease control but unfortunately, most recent CT imaging from September 2025 showed disease progression with new solid nodule in the right upper lobe worrisome for possible metastatic disease Overall, she has initial primary refractory disease to carboplatin , paclitaxel , trastuzumab  and now have resistant disease to trastuzumab  Deruxtecan The patient ultimately wants to proceed with further palliative chemotherapy  She tolerated treatment with combination of pembrolizumab and lenvatinib well I reviewed baseline EKG Her baseline QTc interval is borderline satisfactory The plan will be to continue treatment for minimum 3 months before repeating imaging

## 2023-11-28 NOTE — Assessment & Plan Note (Addendum)
 She has chronic intermittent pancytopenia due to treatment I will order repeat vitamin B12 level to assess

## 2023-11-28 NOTE — Telephone Encounter (Signed)
 Called and left the below message. Ask her to call the office for questions.

## 2023-11-28 NOTE — Telephone Encounter (Signed)
-----   Message from [employer] sent at 11/28/2023  1:59 PM EST ----- TSH is high Please call in synthroid 50 mcg PO daily, dispense 60 tabs no refills

## 2023-11-28 NOTE — Telephone Encounter (Signed)
 Called and left below message/ with instructions on how to take synthroid. Sent synthroid to local pharmacy. Ask her to call the office for questions.

## 2023-11-28 NOTE — Telephone Encounter (Signed)
-----   Message from Almarie Bedford sent at 11/28/2023  1:38 PM EST ----- Her B12 level is high Not the cause of her neuropathy

## 2023-11-29 ENCOUNTER — Other Ambulatory Visit: Payer: Self-pay

## 2023-11-29 ENCOUNTER — Telehealth: Payer: Self-pay

## 2023-11-29 LAB — T4: T4, Total: 7.8 ug/dL (ref 4.5–12.0)

## 2023-11-29 NOTE — Telephone Encounter (Signed)
 Follow-up on previous phone call from Dr. Buzz nurse regarding patient's prescription. Patient indicated she will call back tomorrow to speak with Erminio.

## 2023-11-30 ENCOUNTER — Other Ambulatory Visit (HOSPITAL_COMMUNITY): Payer: Self-pay

## 2023-11-30 ENCOUNTER — Other Ambulatory Visit: Payer: Self-pay

## 2023-11-30 NOTE — Telephone Encounter (Signed)
 Called her back and reviewed call messages on 11/28 regarding B12 results and TSH elevated/ Synthroid  sent to her local pharmacy. She verbalized understanding and will start the synthroid .

## 2023-12-01 ENCOUNTER — Other Ambulatory Visit: Payer: Self-pay

## 2023-12-06 ENCOUNTER — Other Ambulatory Visit: Payer: Self-pay

## 2023-12-08 ENCOUNTER — Other Ambulatory Visit (HOSPITAL_COMMUNITY): Payer: Self-pay

## 2023-12-08 ENCOUNTER — Other Ambulatory Visit: Payer: Self-pay

## 2023-12-08 NOTE — Progress Notes (Signed)
 Specialty Pharmacy Refill Coordination Note  Spoke with Kayla Mcgrath [facility]  Kayla Mcgrath is a 78 y.o. female contacted today regarding refills of specialty medication(s) Lenvatinib  Mesylate (LENVIMA )  Doses on hand: 6  Patient requested: Delivery   Delivery date: 12/12/23   Verified address: [address] [place] [address]  Medication will be filled on 12/11/23

## 2023-12-11 ENCOUNTER — Other Ambulatory Visit: Payer: Self-pay

## 2023-12-11 NOTE — Progress Notes (Signed)
 Clinical Intervention Note  Clinical Intervention Notes: Patient reported adding levothyroxine  to her med list. No DDIs identified with Lenvima    Clinical Intervention Outcomes: Prevention of an adverse drug event   [employer]

## 2023-12-18 ENCOUNTER — Inpatient Hospital Stay

## 2023-12-18 ENCOUNTER — Encounter: Payer: Self-pay | Admitting: Hematology and Oncology

## 2023-12-18 ENCOUNTER — Inpatient Hospital Stay: Admitting: Hematology and Oncology

## 2023-12-18 ENCOUNTER — Inpatient Hospital Stay: Attending: Hematology and Oncology

## 2023-12-18 VITALS — BP 151/89 | HR 72 | Temp 97.8°F | Resp 18 | Wt 125.5 lb

## 2023-12-18 DIAGNOSIS — E039 Hypothyroidism, unspecified: Secondary | ICD-10-CM

## 2023-12-18 DIAGNOSIS — C55 Malignant neoplasm of uterus, part unspecified: Secondary | ICD-10-CM

## 2023-12-18 DIAGNOSIS — N183 Chronic kidney disease, stage 3 unspecified: Secondary | ICD-10-CM | POA: Diagnosis not present

## 2023-12-18 DIAGNOSIS — D696 Thrombocytopenia, unspecified: Secondary | ICD-10-CM | POA: Insufficient documentation

## 2023-12-18 DIAGNOSIS — R946 Abnormal results of thyroid function studies: Secondary | ICD-10-CM | POA: Diagnosis not present

## 2023-12-18 DIAGNOSIS — R413 Other amnesia: Secondary | ICD-10-CM | POA: Diagnosis not present

## 2023-12-18 DIAGNOSIS — F32A Depression, unspecified: Secondary | ICD-10-CM | POA: Insufficient documentation

## 2023-12-18 DIAGNOSIS — R911 Solitary pulmonary nodule: Secondary | ICD-10-CM | POA: Diagnosis not present

## 2023-12-18 DIAGNOSIS — Z79620 Long term (current) use of immunosuppressive biologic: Secondary | ICD-10-CM | POA: Insufficient documentation

## 2023-12-18 DIAGNOSIS — R4189 Other symptoms and signs involving cognitive functions and awareness: Secondary | ICD-10-CM | POA: Diagnosis not present

## 2023-12-18 DIAGNOSIS — Z90710 Acquired absence of both cervix and uterus: Secondary | ICD-10-CM | POA: Diagnosis not present

## 2023-12-18 DIAGNOSIS — R7989 Other specified abnormal findings of blood chemistry: Secondary | ICD-10-CM

## 2023-12-18 DIAGNOSIS — Z171 Estrogen receptor negative status [ER-]: Secondary | ICD-10-CM | POA: Diagnosis not present

## 2023-12-18 DIAGNOSIS — Z5112 Encounter for antineoplastic immunotherapy: Secondary | ICD-10-CM | POA: Diagnosis present

## 2023-12-18 LAB — CBC WITH DIFFERENTIAL (CANCER CENTER ONLY)
Abs Immature Granulocytes: 0.01 K/uL (ref 0.00–0.07)
Basophils Absolute: 0 K/uL (ref 0.0–0.1)
Basophils Relative: 1 %
Eosinophils Absolute: 0.1 K/uL (ref 0.0–0.5)
Eosinophils Relative: 3 %
HCT: 39.2 % (ref 36.0–46.0)
Hemoglobin: 13.3 g/dL (ref 12.0–15.0)
Immature Granulocytes: 0 %
Lymphocytes Relative: 24 %
Lymphs Abs: 1.1 K/uL (ref 0.7–4.0)
MCH: 30.3 pg (ref 26.0–34.0)
MCHC: 33.9 g/dL (ref 30.0–36.0)
MCV: 89.3 fL (ref 80.0–100.0)
Monocytes Absolute: 0.5 K/uL (ref 0.1–1.0)
Monocytes Relative: 10 %
Neutro Abs: 2.9 K/uL (ref 1.7–7.7)
Neutrophils Relative %: 62 %
Platelet Count: 126 K/uL — ABNORMAL LOW (ref 150–400)
RBC: 4.39 MIL/uL (ref 3.87–5.11)
RDW: 16.9 % — ABNORMAL HIGH (ref 11.5–15.5)
WBC Count: 4.7 K/uL (ref 4.0–10.5)
nRBC: 0 % (ref 0.0–0.2)

## 2023-12-18 LAB — CMP (CANCER CENTER ONLY)
ALT: 27 U/L (ref 0–44)
AST: 43 U/L — ABNORMAL HIGH (ref 15–41)
Albumin: 3.7 g/dL (ref 3.5–5.0)
Alkaline Phosphatase: 190 U/L — ABNORMAL HIGH (ref 38–126)
Anion gap: 11 (ref 5–15)
BUN: 19 mg/dL (ref 8–23)
CO2: 23 mmol/L (ref 22–32)
Calcium: 9.1 mg/dL (ref 8.9–10.3)
Chloride: 106 mmol/L (ref 98–111)
Creatinine: 1.1 mg/dL — ABNORMAL HIGH (ref 0.44–1.00)
GFR, Estimated: 51 mL/min — ABNORMAL LOW (ref >60)
Glucose, Bld: 132 mg/dL — ABNORMAL HIGH (ref 70–99)
Potassium: 3.5 mmol/L (ref 3.5–5.1)
Sodium: 140 mmol/L (ref 135–145)
Total Bilirubin: 0.5 mg/dL (ref 0.0–1.2)
Total Protein: 7.3 g/dL (ref 6.5–8.1)

## 2023-12-18 LAB — T4, FREE: Free T4: 1.04 ng/dL (ref 0.61–1.12)

## 2023-12-18 LAB — TSH: TSH: 8.4 u[IU]/mL — ABNORMAL HIGH (ref 0.350–4.500)

## 2023-12-18 MED ORDER — SODIUM CHLORIDE 0.9 % IV SOLN: INTRAVENOUS | Status: DC

## 2023-12-18 MED ORDER — SODIUM CHLORIDE 0.9 % IV SOLN
200.0000 mg | Freq: Once | INTRAVENOUS | Status: AC
  Administered 2023-12-18: 200 mg via INTRAVENOUS
  Filled 2023-12-18: qty 200

## 2023-12-18 NOTE — Assessment & Plan Note (Addendum)
 She initially presented with postmenopausal bleeding in September 2023, s/p hysterectomy with tumor debulking but lymph nodes were not resectable Final pathology reviewed carcinosarcoma: MMR normal, MSI stable, PD-L1 CPS: 1%, Her2 positive, P53 +, Neg genetics, ER negative  She was treated with combination of carboplatin , paclitaxel  and trastuzumab , completed by August 2024 with disease progression, treatment was subsequently changed to trastuzumab  Deruxtecan with positive response to therapy CT imaging study from May 2025 showed stable disease control but unfortunately, most recent CT imaging from September 2025 showed disease progression with new solid nodule in the right upper lobe worrisome for possible metastatic disease Overall, she has initial primary refractory disease to carboplatin , paclitaxel , trastuzumab  and now have resistant disease to trastuzumab  Deruxtecan The patient ultimately wants to proceed with further palliative chemotherapy  She tolerated treatment with combination of pembrolizumab  and lenvatinib  well Her baseline QTc interval is borderline satisfactory She tolerated treatment poorly; this stemmed from new mental fogginess and poor memory as well as new onset of depression that could be due to side effects of Synthroid  versus undiagnosed brain metastasis We will proceed with chemotherapy today I will order MRI of the brain to rule out intracranial metastasis

## 2023-12-18 NOTE — Progress Notes (Signed)
 [facility] OFFICE PROGRESS NOTE  Patient Care Team: Lonn Hicks, MD as PCP - General (Hematology and Oncology) Watt Mirza, MD as Consulting Physician (Family Medicine)  Assessment & Plan Malignant neoplasm of uterus, unspecified site [facility]) Kayla Mcgrath initially presented with postmenopausal bleeding in September 2023, s/p hysterectomy with tumor debulking but lymph nodes were not resectable Final pathology reviewed carcinosarcoma: MMR normal, MSI stable, PD-L1 CPS: 1%, Her2 positive, P53 +, Neg genetics, ER negative  Kayla Mcgrath was treated with combination of carboplatin , paclitaxel  and trastuzumab , completed by August 2024 with disease progression, treatment was subsequently changed to trastuzumab  Deruxtecan with positive response to therapy CT imaging study from May 2025 showed stable disease control but unfortunately, most recent CT imaging from September 2025 showed disease progression with new solid nodule in the right upper lobe worrisome for possible metastatic disease Overall, Kayla Mcgrath has initial primary refractory disease to carboplatin , paclitaxel , trastuzumab  and now have resistant disease to trastuzumab  Deruxtecan The patient ultimately wants to proceed with further palliative chemotherapy  Kayla Mcgrath tolerated treatment with combination of pembrolizumab  and lenvatinib  well Kayla Mcgrath baseline QTc interval is borderline satisfactory Kayla Mcgrath tolerated treatment poorly; this stemmed from new mental fogginess and poor memory as well as new onset of depression that could be due to side effects of Synthroid  versus undiagnosed brain metastasis We will proceed with chemotherapy today I will order MRI of the brain to rule out intracranial metastasis Acquired hypothyroidism Will repeat TSH Thrombocytopenia This is likely due to recent treatment. The patient denies recent history of bleeding such as epistaxis, hematuria or hematochezia. Kayla Mcgrath is asymptomatic from the low platelet count. I will observe for  now.  Kayla Mcgrath does not require transfusion now. I will continue the chemotherapy at current dose without dosage adjustment.  If the thrombocytopenia gets progressive worse in the future, I might have to delay Kayla Mcgrath treatment or adjust the chemotherapy dose.  Elevated TSH Due to side effects of pembrolizumab  I will repeat TSH and free thyroxine today Memory changes Kayla Mcgrath has significant changes in Kayla Mcgrath memory It is noticeable on conversation with Kayla Mcgrath today where the patient stop midsentence and appears to have difficulties with memory recall Certainly, this can be due to abnormal TSH but metastatic disease needs to be rule out Due to high risk cancer, I will order MRI of the brain to rule out metastatic disease  Orders Placed This Encounter  Procedures   MR Brain W Wo Contrast    Standing Status:   Future    Expected Date:   12/25/2023    Expiration Date:   12/17/2024    If indicated for the ordered procedure, I authorize the administration of contrast media per Radiology protocol:   Yes    What is the patient's sedation requirement?:   No Sedation    Does the patient have a pacemaker or implanted devices?:   No    Use SRS Protocol?:   No    Preferred imaging location?:   [facility] (table limit - 500lbs)   TSH    Standing Status:   Future    Number of Occurrences:   1    Expiration Date:   12/17/2024   T4, free    Standing Status:   Future    Number of Occurrences:   1    Expiration Date:   12/17/2024     Hicks Lonn, MD  INTERVAL HISTORY: Kayla Mcgrath returns for treatment follow-up Complications related to previous cycle of chemotherapy included thrombocytopenia,, memory changes, abnormal thyroid   function, and mood changes Since last time I saw Kayla Mcgrath, Kayla Mcgrath started to feel depressed Kayla Mcgrath has significant decline in Kayla Mcgrath memory Kayla Mcgrath have difficulties with recall Kayla Mcgrath complained of mental fogginess No physical weakness PHYSICAL EXAMINATION: ECOG PERFORMANCE STATUS: 1 - Symptomatic but  completely ambulatory  Lab Results  Component Value Date   CAN125 8.3 03/21/2023   CAN125 6.5 02/28/2023   CAN125 8.4 01/17/2023      Latest Ref Rng & Units 12/18/2023    1:27 PM 11/28/2023    9:06 AM 11/07/2023    8:43 AM  CBC  WBC 4.0 - 10.5 K/uL 4.7  3.4  4.0   Hemoglobin 12.0 - 15.0 g/dL 86.6  87.6  87.9   Hematocrit 36.0 - 46.0 % 39.2  37.2  36.5   Platelets 150 - 400 K/uL 126  125  144       Chemistry      Component Value Date/Time   NA 140 12/18/2023 1327   NA 140 11/02/2016 1130   K 3.5 12/18/2023 1327   K 3.8 11/02/2016 1130   CL 106 12/18/2023 1327   CL 105 05/02/2012 0822   CO2 23 12/18/2023 1327   CO2 26 11/02/2016 1130   BUN 19 12/18/2023 1327   BUN 14.7 11/02/2016 1130   CREATININE 1.10 (H) 12/18/2023 1327   CREATININE 0.84 12/05/2018 1014   CREATININE 0.8 11/02/2016 1130      Component Value Date/Time   CALCIUM  9.1 12/18/2023 1327   CALCIUM  9.7 11/02/2016 1130   ALKPHOS 190 (H) 12/18/2023 1327   ALKPHOS 59 11/02/2016 1130   AST 43 (H) 12/18/2023 1327   AST 23 11/02/2016 1130   ALT 27 12/18/2023 1327   ALT 17 11/02/2016 1130   BILITOT 0.5 12/18/2023 1327   BILITOT 0.34 11/02/2016 1130       There were no vitals filed for this visit. There were no vitals filed for this visit. Other relevant data reviewed during this visit included CBC, CMP

## 2023-12-18 NOTE — Assessment & Plan Note (Addendum)
 Due to side effects of pembrolizumab  I will repeat TSH and free thyroxine today

## 2023-12-18 NOTE — Assessment & Plan Note (Addendum)
 She has significant changes in her memory It is noticeable on conversation with her today where the patient stop midsentence and appears to have difficulties with memory recall Certainly, this can be due to abnormal TSH but metastatic disease needs to be rule out Due to high risk cancer, I will order MRI of the brain to rule out metastatic disease

## 2023-12-18 NOTE — Assessment & Plan Note (Addendum)
This is likely due to recent treatment. The patient denies recent history of bleeding such as epistaxis, hematuria or hematochezia. She is asymptomatic from the low platelet count. I will observe for now.  she does not require transfusion now. I will continue the chemotherapy at current dose without dosage adjustment.  If the thrombocytopenia gets progressive worse in the future, I might have to delay her treatment or adjust the chemotherapy dose.   

## 2023-12-18 NOTE — Patient Instructions (Signed)
 [facility] - A DEPT OF [facility]  Discharge Instructions: Thank you for choosing [facility] to provide your oncology and hematology care.   If you have a lab appointment with the Cancer Center, please go directly to the Cancer Center and check in at the registration area.   Wear comfortable clothing and clothing appropriate for easy access to any Portacath or PICC line.   We strive to give you quality time with your provider. You may need to reschedule your appointment if you arrive late (15 or more minutes).  Arriving late affects you and other patients whose appointments are after yours.  Also, if you miss three or more appointments without notifying the office, you may be dismissed from the clinic at the provider's discretion.      For prescription refill requests, have your pharmacy contact our office and allow 72 hours for refills to be completed.    Today you received the following chemotherapy and/or immunotherapy agents: pembrolizumab      To help prevent nausea and vomiting after your treatment, we encourage you to take your nausea medication as directed.  BELOW ARE SYMPTOMS THAT SHOULD BE REPORTED IMMEDIATELY: *FEVER GREATER THAN 100.4 F (38 C) OR HIGHER *CHILLS OR SWEATING *NAUSEA AND VOMITING THAT IS NOT CONTROLLED WITH YOUR NAUSEA MEDICATION *UNUSUAL SHORTNESS OF BREATH *UNUSUAL BRUISING OR BLEEDING *URINARY PROBLEMS (pain or burning when urinating, or frequent urination) *BOWEL PROBLEMS (unusual diarrhea, constipation, pain near the anus) TENDERNESS IN MOUTH AND THROAT WITH OR WITHOUT PRESENCE OF ULCERS (sore throat, sores in mouth, or a toothache) UNUSUAL RASH, SWELLING OR PAIN  UNUSUAL VAGINAL DISCHARGE OR ITCHING   Items with * indicate a potential emergency and should be followed up as soon as possible or go to the Emergency Department if any problems should occur.  Please show the CHEMOTHERAPY ALERT CARD or  IMMUNOTHERAPY ALERT CARD at check-in to the Emergency Department and triage nurse.  Should you have questions after your visit or need to cancel or reschedule your appointment, please contact [facility] - A DEPT OF [facility]  Dept: [phone]  and follow the prompts.  Office hours are 8:00 a.m. to 4:30 p.m. Monday - Friday. Please note that voicemails left after 4:00 p.m. may not be returned until the following business day.  We are closed weekends and major holidays. You have access to a nurse at all times for urgent questions. Please call the main number to the clinic Dept: [phone] and follow the prompts.   For any non-urgent questions, you may also contact your provider using MyChart. We now offer e-Visits for anyone 78 and older to request care online for non-urgent symptoms. For details visit [email or web address].   Also download the MyChart app! Go to the app store, search "MyChart", open the app, select [place], and log in with your MyChart username and password.

## 2023-12-19 ENCOUNTER — Telehealth: Payer: Self-pay

## 2023-12-19 NOTE — Telephone Encounter (Signed)
-----   Message from Kayla Mcgrath sent at 12/19/2023  8:31 AM EST ----- Her TSH is already improving so I do not plan to change the thyroid  meds yet Will proceed with MRI as planned

## 2023-12-19 NOTE — Telephone Encounter (Signed)
 Called and left below message. Ask her to call the office with questions.

## 2023-12-25 ENCOUNTER — Ambulatory Visit (HOSPITAL_COMMUNITY): Admission: RE | Admit: 2023-12-25 | Discharge: 2023-12-25 | Attending: Hematology and Oncology

## 2023-12-25 DIAGNOSIS — C55 Malignant neoplasm of uterus, part unspecified: Secondary | ICD-10-CM | POA: Insufficient documentation

## 2023-12-25 DIAGNOSIS — R413 Other amnesia: Secondary | ICD-10-CM | POA: Diagnosis not present

## 2023-12-25 MED ORDER — GADOBUTROL 1 MMOL/ML IV SOLN
5.0000 mL | Freq: Once | INTRAVENOUS | Status: AC | PRN
  Administered 2023-12-25: 10:00:00 5 mL via INTRAVENOUS

## 2023-12-29 ENCOUNTER — Inpatient Hospital Stay: Admitting: Hematology and Oncology

## 2023-12-29 ENCOUNTER — Encounter: Payer: Self-pay | Admitting: Hematology and Oncology

## 2023-12-29 VITALS — BP 168/87 | HR 80 | Temp 97.8°F | Resp 18 | Ht 59.0 in | Wt 127.6 lb

## 2023-12-29 DIAGNOSIS — C55 Malignant neoplasm of uterus, part unspecified: Secondary | ICD-10-CM | POA: Diagnosis not present

## 2023-12-29 DIAGNOSIS — R4189 Other symptoms and signs involving cognitive functions and awareness: Secondary | ICD-10-CM | POA: Diagnosis not present

## 2023-12-29 DIAGNOSIS — R7989 Other specified abnormal findings of blood chemistry: Secondary | ICD-10-CM | POA: Diagnosis not present

## 2023-12-29 DIAGNOSIS — Z5112 Encounter for antineoplastic immunotherapy: Secondary | ICD-10-CM | POA: Diagnosis not present

## 2023-12-29 NOTE — Assessment & Plan Note (Addendum)
 Recent repeat TSH was improving She will continue current dose of Synthroid 

## 2023-12-29 NOTE — Assessment & Plan Note (Addendum)
 She initially presented with postmenopausal bleeding in September 2023, s/p hysterectomy with tumor debulking but lymph nodes were not resectable Final pathology reviewed carcinosarcoma: MMR normal, MSI stable, PD-L1 CPS: 1%, Her2 positive, P53 +, Neg genetics, ER negative  She was treated with combination of carboplatin , paclitaxel  and trastuzumab , completed by August 2024 with disease progression, treatment was subsequently changed to trastuzumab  Deruxtecan with positive response to therapy CT imaging study from May 2025 showed stable disease control but unfortunately, most recent CT imaging from September 2025 showed disease progression with new solid nodule in the right upper lobe worrisome for possible metastatic disease Overall, she has initial primary refractory disease to carboplatin , paclitaxel , trastuzumab  and now have resistant disease to trastuzumab  Deruxtecan The patient ultimately wants to proceed with further palliative chemotherapy  She tolerated treatment with combination of pembrolizumab  and lenvatinib  well Her baseline QTc interval is borderline satisfactory She tolerated treatment poorly; this stemmed from new mental fogginess and poor memory as well as new onset of depression that could be due to side effects of Synthroid  versus undiagnosed brain metastasis MRI of the was reviewed with the patient which showed no evidence of metastatic disease

## 2023-12-29 NOTE — Progress Notes (Signed)
 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 I recommend neuro-oncology consult and she agreed Malignant neoplasm of uterus, unspecified site [facility]) She initially presented with postmenopausal bleeding in September 2023, s/p hysterectomy with tumor debulking but lymph nodes were not resectable Final pathology reviewed carcinosarcoma: MMR normal, MSI stable, PD-L1 CPS: 1%, Her2 positive, P53 +, Neg genetics, ER negative  She was treated with combination of carboplatin , paclitaxel  and trastuzumab , completed by August 2024 with disease progression, treatment was subsequently changed to trastuzumab  Deruxtecan with positive response to therapy CT imaging study from May 2025 showed stable disease control but unfortunately, most recent CT imaging from September 2025 showed disease progression with new solid nodule in the right upper lobe worrisome for possible metastatic disease Overall, she has initial primary refractory disease to carboplatin , paclitaxel , trastuzumab  and now have resistant disease to trastuzumab  Deruxtecan The patient ultimately wants to proceed with further palliative chemotherapy  She tolerated treatment with combination of pembrolizumab  and lenvatinib  well Her baseline QTc interval is borderline satisfactory She tolerated treatment poorly; this stemmed from new mental fogginess and poor memory as well as new onset of depression that could be due to side effects of Synthroid  versus undiagnosed brain metastasis MRI of the was reviewed with the patient which showed no evidence of metastatic disease Elevated TSH Recent repeat TSH was  improving She will continue current dose of Synthroid   Orders Placed This Encounter  Procedures   Amb Referral to Neuro Oncology    Referral Priority:   Routine    Referral Type:   Consultation    Referral Reason:   Specialty Services Required    Number of Visits Requested:   1     Hicks Lonn, MD  INTERVAL HISTORY: she returns for surveillance follow-up to review recent imaging study She continues to have occasional difficulties with word findings No other symptoms I reviewed recent blood work and imaging study and we discussed the benefits of neuro-oncology consultation  PHYSICAL EXAMINATION: ECOG PERFORMANCE STATUS: 0 - Asymptomatic  Vitals:   12/29/23 1350  BP: (!) 168/87  Pulse: 80  Resp: 18  Temp: 97.8 F (36.6 C)  SpO2: 98%   Filed Weights   12/29/23 1350  Weight: 127 lb 9.6 oz (57.9 kg)    Relevant data reviewed during this visit included MRI, T4 and TSH

## 2023-12-29 NOTE — Assessment & Plan Note (Addendum)
 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

## 2024-01-02 ENCOUNTER — Encounter (INDEPENDENT_AMBULATORY_CARE_PROVIDER_SITE_OTHER): Payer: Self-pay

## 2024-01-02 ENCOUNTER — Other Ambulatory Visit: Payer: Self-pay

## 2024-01-03 ENCOUNTER — Other Ambulatory Visit: Payer: Self-pay

## 2024-01-03 NOTE — Progress Notes (Signed)
 Specialty Pharmacy Refill Coordination Note  Kayla Mcgrath is a 78 y.o. female contacted today regarding refills of specialty medication(s) Lenvatinib  Mesylate (LENVIMA )   Patient requested Delivery   Delivery date: 01/10/24   Verified address: [address] [place]   Medication will be filled on: 01/09/24

## 2024-01-05 ENCOUNTER — Other Ambulatory Visit: Payer: Self-pay

## 2024-01-05 NOTE — Progress Notes (Signed)
 Encounter opened in error.

## 2024-01-09 ENCOUNTER — Other Ambulatory Visit: Payer: Self-pay

## 2024-01-09 ENCOUNTER — Inpatient Hospital Stay

## 2024-01-09 ENCOUNTER — Inpatient Hospital Stay: Admitting: Hematology and Oncology

## 2024-01-09 VITALS — BP 157/79 | HR 83 | Temp 97.7°F | Resp 16 | Wt 125.0 lb

## 2024-01-09 DIAGNOSIS — N183 Chronic kidney disease, stage 3 unspecified: Secondary | ICD-10-CM

## 2024-01-09 DIAGNOSIS — C55 Malignant neoplasm of uterus, part unspecified: Secondary | ICD-10-CM

## 2024-01-09 DIAGNOSIS — Z5112 Encounter for antineoplastic immunotherapy: Secondary | ICD-10-CM | POA: Diagnosis not present

## 2024-01-09 DIAGNOSIS — D696 Thrombocytopenia, unspecified: Secondary | ICD-10-CM

## 2024-01-09 LAB — CBC WITH DIFFERENTIAL (CANCER CENTER ONLY)
Abs Immature Granulocytes: 0.01 K/uL (ref 0.00–0.07)
Basophils Absolute: 0 K/uL (ref 0.0–0.1)
Basophils Relative: 1 %
Eosinophils Absolute: 0.2 K/uL (ref 0.0–0.5)
Eosinophils Relative: 4 %
HCT: 37.2 % (ref 36.0–46.0)
Hemoglobin: 12.5 g/dL (ref 12.0–15.0)
Immature Granulocytes: 0 %
Lymphocytes Relative: 21 %
Lymphs Abs: 1.2 K/uL (ref 0.7–4.0)
MCH: 30 pg (ref 26.0–34.0)
MCHC: 33.6 g/dL (ref 30.0–36.0)
MCV: 89.2 fL (ref 80.0–100.0)
Monocytes Absolute: 0.7 K/uL (ref 0.1–1.0)
Monocytes Relative: 12 %
Neutro Abs: 3.5 K/uL (ref 1.7–7.7)
Neutrophils Relative %: 62 %
Platelet Count: 127 K/uL — ABNORMAL LOW (ref 150–400)
RBC: 4.17 MIL/uL (ref 3.87–5.11)
RDW: 17.6 % — ABNORMAL HIGH (ref 11.5–15.5)
WBC Count: 5.5 K/uL (ref 4.0–10.5)
nRBC: 0 % (ref 0.0–0.2)

## 2024-01-09 LAB — CMP (CANCER CENTER ONLY)
ALT: 24 U/L (ref 0–44)
AST: 42 U/L — ABNORMAL HIGH (ref 15–41)
Albumin: 3.8 g/dL (ref 3.5–5.0)
Alkaline Phosphatase: 215 U/L — ABNORMAL HIGH (ref 38–126)
Anion gap: 11 (ref 5–15)
BUN: 13 mg/dL (ref 8–23)
CO2: 25 mmol/L (ref 22–32)
Calcium: 9.3 mg/dL (ref 8.9–10.3)
Chloride: 104 mmol/L (ref 98–111)
Creatinine: 1.12 mg/dL — ABNORMAL HIGH (ref 0.44–1.00)
GFR, Estimated: 50 mL/min — ABNORMAL LOW
Glucose, Bld: 94 mg/dL (ref 70–99)
Potassium: 3.4 mmol/L — ABNORMAL LOW (ref 3.5–5.1)
Sodium: 140 mmol/L (ref 135–145)
Total Bilirubin: 0.6 mg/dL (ref 0.0–1.2)
Total Protein: 7.2 g/dL (ref 6.5–8.1)

## 2024-01-09 MED ORDER — SODIUM CHLORIDE 0.9 % IV SOLN
200.0000 mg | Freq: Once | INTRAVENOUS | Status: AC
  Administered 2024-01-09: 200 mg via INTRAVENOUS
  Filled 2024-01-09: qty 200

## 2024-01-09 MED ORDER — SODIUM CHLORIDE 0.9 % IV SOLN: INTRAVENOUS | Status: DC

## 2024-01-09 NOTE — Patient Instructions (Signed)
 [facility] - A DEPT OF [facility]  Discharge Instructions: Thank you for choosing [facility] to provide your oncology and hematology care.   If you have a lab appointment with the Cancer Center, please go directly to the Cancer Center and check in at the registration area.   Wear comfortable clothing and clothing appropriate for easy access to any Portacath or PICC line.   We strive to give you quality time with your provider. You may need to reschedule your appointment if you arrive late (15 or more minutes).  Arriving late affects you and other patients whose appointments are after yours.  Also, if you miss three or more appointments without notifying the office, you may be dismissed from the clinic at the provider's discretion.      For prescription refill requests, have your pharmacy contact our office and allow 72 hours for refills to be completed.    Today you received the following chemotherapy and/or immunotherapy agents keytruda       To help prevent nausea and vomiting after your treatment, we encourage you to take your nausea medication as directed.  BELOW ARE SYMPTOMS THAT SHOULD BE REPORTED IMMEDIATELY: *FEVER GREATER THAN 100.4 F (38 C) OR HIGHER *CHILLS OR SWEATING *NAUSEA AND VOMITING THAT IS NOT CONTROLLED WITH YOUR NAUSEA MEDICATION *UNUSUAL SHORTNESS OF BREATH *UNUSUAL BRUISING OR BLEEDING *URINARY PROBLEMS (pain or burning when urinating, or frequent urination) *BOWEL PROBLEMS (unusual diarrhea, constipation, pain near the anus) TENDERNESS IN MOUTH AND THROAT WITH OR WITHOUT PRESENCE OF ULCERS (sore throat, sores in mouth, or a toothache) UNUSUAL RASH, SWELLING OR PAIN  UNUSUAL VAGINAL DISCHARGE OR ITCHING   Items with * indicate a potential emergency and should be followed up as soon as possible or go to the Emergency Department if any problems should occur.  Please show the CHEMOTHERAPY ALERT CARD or IMMUNOTHERAPY  ALERT CARD at check-in to the Emergency Department and triage nurse.  Should you have questions after your visit or need to cancel or reschedule your appointment, please contact [facility] - A DEPT OF [facility]  Dept: [phone]  and follow the prompts.  Office hours are 8:00 a.m. to 4:30 p.m. Monday - Friday. Please note that voicemails left after 4:00 p.m. may not be returned until the following business day.  We are closed weekends and major holidays. You have access to a nurse at all times for urgent questions. Please call the main number to the clinic Dept: [phone] and follow the prompts.   For any non-urgent questions, you may also contact your provider using MyChart. We now offer e-Visits for anyone 47 and older to request care online for non-urgent symptoms. For details visit [email or web address].   Also download the MyChart app! Go to the app store, search MyChart, open the app, select [place], and log in with your MyChart username and password.

## 2024-01-10 ENCOUNTER — Other Ambulatory Visit: Payer: Self-pay

## 2024-01-12 ENCOUNTER — Encounter: Payer: Self-pay | Admitting: Hematology and Oncology

## 2024-01-12 ENCOUNTER — Other Ambulatory Visit: Payer: Self-pay | Admitting: Hematology and Oncology

## 2024-01-12 NOTE — Assessment & Plan Note (Addendum)
This is likely due to recent treatment. The patient denies recent history of bleeding such as epistaxis, hematuria or hematochezia. She is asymptomatic from the low platelet count. I will observe for now.  she does not require transfusion now. I will continue the chemotherapy at current dose without dosage adjustment.  If the thrombocytopenia gets progressive worse in the future, I might have to delay her treatment or adjust the chemotherapy dose.   

## 2024-01-12 NOTE — Assessment & Plan Note (Addendum)
 She has history of intermittent kidney failure We will monitor that closely

## 2024-01-12 NOTE — Assessment & Plan Note (Addendum)
 She initially presented with postmenopausal bleeding in September 2023, s/p hysterectomy with tumor debulking but lymph nodes were not resectable Final pathology reviewed carcinosarcoma: MMR normal, MSI stable, PD-L1 CPS: 1%, Her2 positive, P53 +, Neg genetics, ER negative  She was treated with combination of carboplatin , paclitaxel  and trastuzumab , completed by August 2024 with disease progression, treatment was subsequently changed to trastuzumab  Deruxtecan with positive response to therapy CT imaging study from May 2025 showed stable disease control but unfortunately, most recent CT imaging from September 2025 showed disease progression with new solid nodule in the right upper lobe worrisome for possible metastatic disease Overall, she has initial primary refractory disease to carboplatin , paclitaxel , trastuzumab  and now have resistant disease to trastuzumab  Deruxtecan The patient ultimately wants to proceed with further palliative chemotherapy  She tolerated treatment with combination of pembrolizumab  and lenvatinib  well Her baseline QTc interval is borderline satisfactory She tolerated treatment poorly; this stemmed from new mental fogginess and poor memory as well as new onset of depression that could be due to side effects of Synthroid  versus undiagnosed brain metastasis MRI of the was reviewed with the patient which showed no evidence of metastatic disease She will continue her treatment as directed I plan to repeat imaging study before her next dose

## 2024-01-12 NOTE — Progress Notes (Signed)
 [facility] OFFICE PROGRESS NOTE  Patient Care Team: Lonn Hicks, MD as PCP - General (Hematology and Oncology) Watt Mirza, MD as Consulting Physician (Family Medicine)  Assessment & Plan Malignant neoplasm of uterus, unspecified site [facility]) Kayla Mcgrath initially presented with postmenopausal bleeding in September 2023, s/p hysterectomy with tumor debulking but lymph nodes were not resectable Final pathology reviewed carcinosarcoma: MMR normal, MSI stable, PD-L1 CPS: 1%, Her2 positive, P53 +, Neg genetics, ER negative  Kayla Mcgrath was treated with combination of carboplatin , paclitaxel  and trastuzumab , completed by August 2024 with disease progression, treatment was subsequently changed to trastuzumab  Deruxtecan with positive response to therapy CT imaging study from May 2025 showed stable disease control but unfortunately, most recent CT imaging from September 2025 showed disease progression with new solid nodule in the right upper lobe worrisome for possible metastatic disease Overall, Kayla Mcgrath has initial primary refractory disease to carboplatin , paclitaxel , trastuzumab  and now have resistant disease to trastuzumab  Deruxtecan The patient ultimately wants to proceed with further palliative chemotherapy  Kayla Mcgrath tolerated treatment with combination of pembrolizumab  and lenvatinib  well Her baseline QTc interval is borderline satisfactory Kayla Mcgrath tolerated treatment poorly; this stemmed from new mental fogginess and poor memory as well as new onset of depression that could be due to side effects of Synthroid  versus undiagnosed brain metastasis MRI of the was reviewed with the patient which showed no evidence of metastatic disease Kayla Mcgrath will continue her treatment as directed I plan to repeat imaging study before her next dose Thrombocytopenia This is likely due to recent treatment. The patient denies recent history of bleeding such as epistaxis, hematuria or hematochezia. Kayla Mcgrath is asymptomatic from the low  platelet count. I will observe for now.  Kayla Mcgrath does not require transfusion now. I will continue the chemotherapy at current dose without dosage adjustment.  If the thrombocytopenia gets progressive worse in the future, I might have to delay her treatment or adjust the chemotherapy dose.  Stage 3 chronic kidney disease, unspecified whether stage 3a or 3b CKD (HCC) Kayla Mcgrath has history of intermittent kidney failure We will monitor that closely  Orders Placed This Encounter  Procedures   CT CHEST ABDOMEN PELVIS W CONTRAST    Standing Status:   Future    Expected Date:   01/23/2024    Expiration Date:   01/08/2025    If indicated for the ordered procedure, I authorize the administration of contrast media per Radiology protocol:   Yes    Does the patient have a contrast media/X-ray dye allergy?:   No    Preferred imaging location?:   [facility]    If indicated for the ordered procedure, I authorize the administration of oral contrast media per Radiology protocol:   No    Reason for no oral contrast::   no need oral contrast     Hicks Lonn, MD  INTERVAL HISTORY: Kayla Mcgrath returns for treatment follow-up Complications related to previous cycle of chemotherapy included thrombocytopenia, and elevated serum creatinine Kayla Mcgrath tolerated treatment well Kayla Mcgrath is noted to have dry skin and occasional skin itchiness We discussed timing of her next imaging  PHYSICAL EXAMINATION: ECOG PERFORMANCE STATUS: 1 - Symptomatic but completely ambulatory  Lab Results  Component Value Date   CAN125 8.3 03/21/2023   CAN125 6.5 02/28/2023   CAN125 8.4 01/17/2023      Latest Ref Rng & Units 01/09/2024    1:15 PM 12/18/2023    1:27 PM 11/28/2023    9:06 AM  CBC  WBC 4.0 -  10.5 K/uL 5.5  4.7  3.4   Hemoglobin 12.0 - 15.0 g/dL 87.4  86.6  87.6   Hematocrit 36.0 - 46.0 % 37.2  39.2  37.2   Platelets 150 - 400 K/uL 127  126  125       Chemistry      Component Value Date/Time   NA 140 01/09/2024 1315   NA 140  11/02/2016 1130   K 3.4 (L) 01/09/2024 1315   K 3.8 11/02/2016 1130   CL 104 01/09/2024 1315   CL 105 05/02/2012 0822   CO2 25 01/09/2024 1315   CO2 26 11/02/2016 1130   BUN 13 01/09/2024 1315   BUN 14.7 11/02/2016 1130   CREATININE 1.12 (H) 01/09/2024 1315   CREATININE 0.84 12/05/2018 1014   CREATININE 0.8 11/02/2016 1130      Component Value Date/Time   CALCIUM  9.3 01/09/2024 1315   CALCIUM  9.7 11/02/2016 1130   ALKPHOS 215 (H) 01/09/2024 1315   ALKPHOS 59 11/02/2016 1130   AST 42 (H) 01/09/2024 1315   AST 23 11/02/2016 1130   ALT 24 01/09/2024 1315   ALT 17 11/02/2016 1130   BILITOT 0.6 01/09/2024 1315   BILITOT 0.34 11/02/2016 1130       There were no vitals filed for this visit. There were no vitals filed for this visit. Other relevant data reviewed during this visit included CBC, CMP, TSH

## 2024-01-21 ENCOUNTER — Other Ambulatory Visit: Payer: Self-pay | Admitting: Hematology and Oncology

## 2024-01-22 ENCOUNTER — Other Ambulatory Visit: Payer: Self-pay | Admitting: Hematology and Oncology

## 2024-01-22 ENCOUNTER — Other Ambulatory Visit: Payer: Self-pay

## 2024-01-22 ENCOUNTER — Encounter: Payer: Self-pay | Admitting: Hematology and Oncology

## 2024-01-23 ENCOUNTER — Other Ambulatory Visit: Payer: Self-pay

## 2024-01-23 ENCOUNTER — Ambulatory Visit (HOSPITAL_COMMUNITY)
Admission: RE | Admit: 2024-01-23 | Discharge: 2024-01-23 | Disposition: A | Discharge status: Home / Self Care | Source: Ambulatory Visit | Attending: Hematology and Oncology | Admitting: Hematology and Oncology

## 2024-01-23 DIAGNOSIS — C55 Malignant neoplasm of uterus, part unspecified: Secondary | ICD-10-CM | POA: Insufficient documentation

## 2024-01-23 MED ORDER — HEPARIN SOD (PORK) LOCK FLUSH 100 UNIT/ML IV SOLN
INTRAVENOUS | Status: AC
  Filled 2024-01-23: qty 5

## 2024-01-23 MED ORDER — IOHEXOL 300 MG/ML  SOLN
100.0000 mL | Freq: Once | INTRAMUSCULAR | Status: AC | PRN
  Administered 2024-01-23: 100 mL via INTRAVENOUS

## 2024-01-23 MED ORDER — HEPARIN SOD (PORK) LOCK FLUSH 100 UNIT/ML IV SOLN
500.0000 [IU] | Freq: Once | INTRAVENOUS | Status: AC
  Administered 2024-01-23: 500 [IU] via INTRAVENOUS

## 2024-01-23 NOTE — Progress Notes (Signed)
 Specialty Pharmacy Ongoing Clinical Assessment Note  Kayla Mcgrath is a 79 y.o. female who is being followed by the specialty pharmacy service for RxSp Oncology   Patient's specialty medication(s) reviewed today: Lenvatinib  Mesylate (LENVIMA )   Missed doses in the last 4 weeks: 0   Patient/Caregiver did not have any additional questions or concerns.   Therapeutic benefit summary: Patient is achieving benefit   Adverse events/side effects summary: Experienced adverse events/side effects (diarrhea, mostly in the mornings, stopping coffee intake has helped, I recommended Imodium PRN, she expressed understanding)   Patient's therapy is appropriate to: Continue    Goals Addressed             This Visit's Progress    Maintain optimal adherence to therapy   On track    Patient is on track. Patient will maintain adherence         Follow up: 3 months  Silvano LOISE Dolly Specialty Pharmacist

## 2024-01-25 ENCOUNTER — Inpatient Hospital Stay: Attending: Hematology and Oncology | Admitting: Internal Medicine

## 2024-01-25 VITALS — BP 137/88 | HR 94 | Temp 98.3°F | Resp 18 | Wt 124.8 lb

## 2024-01-25 DIAGNOSIS — C541 Malignant neoplasm of endometrium: Secondary | ICD-10-CM | POA: Insufficient documentation

## 2024-01-25 DIAGNOSIS — D696 Thrombocytopenia, unspecified: Secondary | ICD-10-CM | POA: Diagnosis not present

## 2024-01-25 DIAGNOSIS — N183 Chronic kidney disease, stage 3 unspecified: Secondary | ICD-10-CM | POA: Diagnosis not present

## 2024-01-25 DIAGNOSIS — Z87891 Personal history of nicotine dependence: Secondary | ICD-10-CM | POA: Diagnosis not present

## 2024-01-25 DIAGNOSIS — Z79620 Long term (current) use of immunosuppressive biologic: Secondary | ICD-10-CM | POA: Diagnosis not present

## 2024-01-25 DIAGNOSIS — R4189 Other symptoms and signs involving cognitive functions and awareness: Secondary | ICD-10-CM | POA: Diagnosis not present

## 2024-01-25 DIAGNOSIS — C779 Secondary and unspecified malignant neoplasm of lymph node, unspecified: Secondary | ICD-10-CM | POA: Diagnosis not present

## 2024-01-25 DIAGNOSIS — R946 Abnormal results of thyroid function studies: Secondary | ICD-10-CM | POA: Diagnosis not present

## 2024-01-25 DIAGNOSIS — Z5112 Encounter for antineoplastic immunotherapy: Secondary | ICD-10-CM | POA: Insufficient documentation

## 2024-01-25 DIAGNOSIS — Z800 Family history of malignant neoplasm of digestive organs: Secondary | ICD-10-CM | POA: Insufficient documentation

## 2024-01-25 DIAGNOSIS — Z8049 Family history of malignant neoplasm of other genital organs: Secondary | ICD-10-CM | POA: Insufficient documentation

## 2024-01-25 DIAGNOSIS — R413 Other amnesia: Secondary | ICD-10-CM | POA: Diagnosis not present

## 2024-01-25 DIAGNOSIS — Z9221 Personal history of antineoplastic chemotherapy: Secondary | ICD-10-CM | POA: Insufficient documentation

## 2024-01-25 NOTE — Progress Notes (Signed)
 "  [facility] [address]  [place], [place] [address] [phone]   Cognitive Survivorship Evaluation  Date of Service: 01/25/24 Patient Name: Kayla Mcgrath Patient MRN: [id number] Patient DOB: [birth year] Provider: Arthea MARLA Manns, MD  Identifying Statement:  Kayla Mcgrath is a 79 y.o. female who presents for initial consultation and evaluation regarding cancer associated cognitive decline.    Referring Provider: Lonn Hicks, MD [address] [place],  [place] [address]  Primary Cancer:  Oncologic History: Oncology History Overview Note  MMR normal MSI stable PD-L1 CPS: 1% Her2 positive P53 + Carcinosarcoma Neg genetics ER negative   Uterine cancer (HCC)  09/23/2021 Initial Diagnosis   She presented with PMB   09/28/2021 Imaging   US  pelvis Enlarged uterus with fibroids. Pelvic sonogram is otherwise unremarkable.    11/11/2021 Pathology Results   A. CERVIX, MASS, EXCISION:  - HIGH-GRADE UNDIFFERENTIATED MALIGNANCY WITH EXTENSIVE NECROSIS (SEE COMMENT).   COMMENT:  The tumor consists of mostly high-grade sarcomatoid morphology with focal epithelioid morphology.  Immunohistochemical stains are performed on block A4.  The tumor cells are positive for desmin and p16 while negative for CD45, CK7, CK20, chromogranin, synaptophysin, Melan-A, p40, AE1/AE3, SMA, p63, cyclin D1, S100 and smooth muscle myosin.  Ki-67 proliferation index is high.  CD10 staining shows focal nonspecific staining.  The differential diagnosis includes a high-grade sarcoma and a partially sampled carcinosarcoma.    11/25/2021 Imaging   MRI pelvis Marked distention of the entire endometrial cavity by heterogeneously enhancing soft tissue density, which extends through the endocervical canal into the lower vagina. This is highly suspicious for endometrial carcinoma.   Diffuse myometrial thinning due to marked distention of endometrial cavity limits  evaluation; deep myometrial invasion cannot be excluded in the uterine fundus.   No evidence of extra-uterine extension of tumor.   Lymphadenopathy in lower abdominal retroperitoneum, bilateral iliac chains, and sigmoid mesocolon, consistent with metastatic disease.   Sigmoid diverticulosis. No radiographic evidence of diverticulitis.   11/26/2021 Initial Diagnosis   Uterine cancer (HCC)   11/26/2021 Cancer Staging   Staging form: Corpus Uteri - Carcinoma and Carcinosarcoma, AJCC 8th Edition - Clinical stage from 11/26/2021: FIGO Stage IVB (rcT3, rcN2, rcM1) - Signed by Lonn Hicks, MD on 10/06/2023 Stage prefix: Recurrence Histologic grade (G): G3 Histologic grading system: 3 grade system   11/30/2021 Pathology Results   FINAL MICROSCOPIC DIAGNOSIS:  A. UTERINE CONTENTS, BIOPSY: - HIGH-GRADE UNDIFFERENTIATED MALIGNANCY WITH EXTENSIVE NECROSIS (SEE COMMENT).  COMMENT: The patient's history of a high-grade undifferentiated malignancy with extensive necrosis of the cervix is noted.  The morphological features of the current tumor cells are similar to the tumor cells seen in the previous cervical mass excision.  Immunohistochemical staining for desmin and cytokeratin AE1/AE3 is performed on block A1.  The tumor cells are diffusely positive for desmin.   AE1/AE3 stain is focally positive, however this is scant and of unclear clinical significance. Given the small quantity of tissue in the current biopsy, a definitive distinction between a high-grade sarcoma and carcinosarcoma cannot be made as this biopsy may not be representative of the entire tumor. If clinically indicated, this case as well as the previous cervical mass excision ([id number]) may be sent out for expert consultation. Clinical correlation recommended.     12/01/2021 Tumor Marker   Patient's tumor was tested for the following markers: CA-125. Results of the tumor marker test revealed 491.   12/22/2021 Surgery    Robotic-assisted laparoscopic total  hysterectomy with bilateral salpingo-oophorectomy, tumor debulking including high common iliac lymph node excision, aborted attempt of bilateral external iliac enlarged lymph nodes, mini-lap for specimen delivery, cystoscopy    12/22/2021 Pathology Results   FINAL MICROSCOPIC DIAGNOSIS:  A. LEFT CERVICAL MARGIN, EXCISION: - Positive for carcinoma  B. HIGH LEFT COMMON ILIAC LYMPH NODE, EXCISION: - Metastatic carcinosarcoma involving the lymph node  C. 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, including undifferentiated sarcoma with rhabdoid features and high-grade serous carcinoma, see comment - Tumor invades for a depth of 1 mm where myometrial thickness is 5 mm - Benign bilateral fallopian tubes and ovaries - See oncology table - See comment  ONCOLOGY TABLE:  UTERUS, CARCINOMA OR CARCINOSARCOMA: Resection  Procedure: Total hysterectomy and bilateral salpingo-oophorectomy Histologic Type: Carcinosarcoma (mixed malignant Mullerian tumor) Histologic Grade: High-grade Myometrial Invasion:      Depth of Myometrial Invasion (mm): 1 mm      Myometrial Thickness (mm): 5 mm      Percentage of Myometrial Invasion: 20% Uterine Serosa Involvement: Not identified Cervical stromal Involvement: Present Extent of involvement of other tissue/organs: Not identified Peritoneal/Ascitic Fluid: Not applicable Lymphovascular Invasion: Present Regional Lymph Nodes:      Pelvic Lymph Nodes Examined:                                  0 Sentinel                                  1 Non-sentinel                                  1 Total      Pelvic Lymph Nodes with Metastasis: 1                          Macrometastasis: (>2.0 mm): 1                          Micrometastasis: (>0.2 mm and < 2.0 mm): 0                          Isolated Tumor Cells (<0.2 mm): 0                           Laterality of Lymph Node with Tumor: Left                          Extracapsular Extension: Not identified      Para-aortic Lymph Nodes Examined:                                   0 Sentinel                                   0 non-sentinel  0 total Distant Metastasis:      Distant Site(s) Involved: Not applicable Pathologic Stage Classification (pTNM, AJCC 8th Edition): pT1a, pN1 Ancillary Studies: MMR / MSI testing will be ordered Representative Tumor Block: C1 Comment(s): None  ([id number])       01/13/2022 Procedure   Placement of single lumen port a cath via right internal jugular vein. The catheter tip lies at the cavo-atrial junction. A power injectable port a cath was placed and is ready for immediate use.     01/19/2022 Echocardiogram    1. Left ventricular ejection fraction, by estimation, is 60 to 65%. The left ventricle has normal function. The left ventricle has no regional wall motion abnormalities. Left ventricular diastolic parameters are consistent with Grade I diastolic dysfunction (impaired relaxation). The average left ventricular global longitudinal strain is -24.0 %. The global longitudinal strain is normal.  2. Right ventricular systolic function is normal. The right ventricular size is normal. There is normal pulmonary artery systolic pressure.  3. Left atrial size was mildly dilated.  4. The mitral valve is normal in structure. No evidence of mitral valve regurgitation. No evidence of mitral stenosis.  5. The aortic valve was not well visualized. Aortic valve regurgitation is not visualized. Aortic valve sclerosis is present, with no evidence of aortic valve stenosis.  6. The inferior vena cava is normal in size with greater than 50% respiratory variability, suggesting right atrial pressure of 3 mmHg.     01/27/2022 Tumor Marker   Patient's tumor was tested for the following markers: CA-125. Results of the tumor marker test revealed  307.   02/14/2022 - 08/22/2022 Chemotherapy   Patient is on Treatment Plan : UTERINE SEROUS CARCINOMA Carboplatin  + Paclitaxel  + Trastuzumab  q21d x 6 Cycles / Trastuzumab  q21d     02/15/2022 Tumor Marker   Patient's tumor was tested for the following markers: CA-125. Results of the tumor marker test revealed 161.   03/29/2022 Tumor Marker   Patient's tumor was tested for the following markers: CA-125. Results of the tumor marker test revealed 30.   04/13/2022 Echocardiogram   1. Left ventricular ejection fraction, by estimation, is 60 to 65%. The left ventricle has normal function. The left ventricle has no regional wall motion abnormalities. Left ventricular diastolic parameters are consistent with Grade I diastolic dysfunction (impaired relaxation). The average left ventricular global longitudinal strain is -19.4 %. The global longitudinal strain is normal.  2. Right ventricular systolic function is normal. The right ventricular size is normal. There is normal pulmonary artery systolic pressure. The estimated right ventricular systolic pressure is 21.0 mmHg.  3. Left atrial size was mild to moderately dilated.  4. The mitral valve is grossly normal. Trivial mitral valve regurgitation.  5. The aortic valve is tricuspid. Aortic valve regurgitation is not visualized. Aortic valve sclerosis is present, with no evidence of aortic valve stenosis.  6. The inferior vena cava is normal in size with greater than 50% respiratory variability, suggesting right atrial pressure of 3 mmHg.   06/01/2022 Tumor Marker   Patient's tumor was tested for the following markers: CA-125. Results of the tumor marker test revealed 8.   06/05/2022 Genetic Testing   Negative genetic testing on the Multi-Cancer gene panel + RNA.  The report date is Jun 05, 2022.  The Multi-Cancer + RNA Panel offered by [facility] includes sequencing and/or deletion/duplication analysis of the following 70 genes:  AIP*, ALK, APC*, ATM*, AXIN2*,  BAP1*, BARD1*, BLM*, BMPR1A*, BRCA1*, BRCA2*, BRIP1*, CDC73*, CDH1*, CDK4, CDKN1B*,  CDKN2A, CHEK2*, CTNNA1*, DICER1*, EPCAM (del/dup only), EGFR, FH*, FLCN*, GREM1 (promoter dup only), HOXB13, KIT, LZTR1, MAX*, MBD4, MEN1*, MET, MITF, MLH1*, MSH2*, MSH3*, MSH6*, MUTYH*, NF1*, NF2*, NTHL1*, PALB2*, PDGFRA, PMS2*, POLD1*, POLE*, POT1*, PRKAR1A*, PTCH1*, PTEN*, RAD51C*, RAD51D*, RB1*, RET, SDHA* (sequencing only), SDHAF2*, SDHB*, SDHC*, SDHD*, SMAD4*, SMARCA4*, SMARCB1*, SMARCE1*, STK11*, SUFU*, TMEM127*, TP53*, TSC1*, TSC2*, VHL*. RNA analysis is performed for * genes.    06/20/2022 Imaging   1. Enlarged periaortic and iliac lymph nodes consistent metastatic disease. Bulky iliac node on the LEFT. 2. No evidence of peritoneal metastasis. 3. Post hysterectomy and oophorectomy.   07/13/2022 Tumor Marker   Patient's tumor was tested for the following markers: CA-125. Results of the tumor marker test revealed 8.8.   07/27/2022 Echocardiogram   ECHOCARDIOGRAM COMPLETE  Result Date: 07/27/2022    ECHOCARDIOGRAM REPORT   Patient Name:   Kayla Mcgrath Hillis Date of Exam: 07/27/2022 Medical Rec #:  [id number]      Height:       59.5 in Accession #:    [id number]     Weight:       140.4 lb Date of Birth:  02-21-1945      BSA:          1.596 m Patient Age:    76 years       BP:           135/92 mmHg Patient Gender: F              HR:           69 bpm. Exam Location:  Outpatient Procedure: 2D Echo, Color Doppler, Cardiac Doppler and Strain Analysis Indications:    Chemo  History:        Patient has prior history of Echocardiogram examinations, most                 recent 04/13/2022. Breast Cancer, Uterine Cancer, CKD; Risk                 Factors:Dyslipidemia.  Sonographer:    Eleanor Lincoln Referring Phys: [id number] NI GORSUCH  Sonographer Comments: Global longitudinal strain was attempted. IMPRESSIONS  1. Prior GLS was -19.4 on 04/13/22 suggest f/u echo in 3 months to make sure not seeing early myocardial issues . Left ventricular  ejection fraction, by estimation, is 55 to 60%. The left ventricle has normal function. The left ventricle has no regional wall motion abnormalities. Left ventricular diastolic parameters were normal. The average left ventricular global longitudinal strain is -15.9 %. The global longitudinal strain is normal.  2. Right ventricular systolic function is normal. The right ventricular size is normal.  3. The mitral valve is normal in structure. No evidence of mitral valve regurgitation. No evidence of mitral stenosis.  4. The aortic valve was not well visualized. There is mild calcification of the aortic valve. Aortic valve regurgitation is not visualized. Aortic valve sclerosis is present, with no evidence of aortic valve stenosis.  5. The inferior vena cava is normal in size with greater than 50% respiratory variability, suggesting right atrial pressure of 3 mmHg. FINDINGS  Left Ventricle: Prior GLS was -19.4 on 04/13/22 suggest f/u echo in 3 months to make sure not seeing early myocardial issues. Left ventricular ejection fraction, by estimation, is 55 to 60%. The left ventricle has normal function. The left ventricle has no regional wall motion abnormalities. The average left ventricular global longitudinal strain is -15.9 %. The global longitudinal strain is normal. The left ventricular  internal cavity size was normal in size. There is no left ventricular hypertrophy. Left ventricular diastolic parameters were normal. Right Ventricle: The right ventricular size is normal. No increase in right ventricular wall thickness. Right ventricular systolic function is normal. Left Atrium: Left atrial size was normal in size. Right Atrium: Right atrial size was normal in size. Pericardium: There is no evidence of pericardial effusion. Mitral Valve: The mitral valve is normal in structure. No evidence of mitral valve regurgitation. No evidence of mitral valve stenosis. Tricuspid Valve: The tricuspid valve is normal in structure.  Tricuspid valve regurgitation is not demonstrated. No evidence of tricuspid stenosis. Aortic Valve: The aortic valve was not well visualized. There is mild calcification of the aortic valve. Aortic valve regurgitation is not visualized. Aortic valve sclerosis is present, with no evidence of aortic valve stenosis. Aortic valve mean gradient measures 4.0 mmHg. Aortic valve peak gradient measures 8.2 mmHg. Aortic valve area, by VTI measures 1.40 cm. Pulmonic Valve: The pulmonic valve was normal in structure. Pulmonic valve regurgitation is not visualized. No evidence of pulmonic stenosis. Aorta: The aortic root is normal in size and structure. Venous: The inferior vena cava is normal in size with greater than 50% respiratory variability, suggesting right atrial pressure of 3 mmHg. IAS/Shunts: No atrial level shunt detected by color flow Doppler.  LEFT VENTRICLE PLAX 2D LVIDd:         4.00 cm   Diastology LVIDs:         2.60 cm   LV e' medial:    5.77 cm/s LV PW:         0.90 cm   LV E/e' medial:  10.8 LV IVS:        0.80 cm   LV e' lateral:   13.50 cm/s LVOT diam:     1.70 cm   LV E/e' lateral: 4.6 LV SV:         41 LV SV Index:   26        2D Longitudinal Strain LVOT Area:     2.27 cm  2D Strain GLS Avg:     -15.9 %  RIGHT VENTRICLE RV Basal diam:  2.70 cm RV Mid diam:    2.10 cm RV S prime:     12.80 cm/s TAPSE (M-mode): 2.1 cm LEFT ATRIUM             Index        RIGHT ATRIUM          Index LA diam:        2.70 cm 1.69 cm/m   RA Area:     8.87 cm LA Vol (A2C):   35.4 ml 22.18 ml/m  RA Volume:   16.30 ml 10.21 ml/m LA Vol (A4C):   36.6 ml 22.93 ml/m LA Biplane Vol: 36.1 ml 22.62 ml/m  AORTIC VALVE AV Area (Vmax):    1.38 cm AV Area (Vmean):   1.35 cm AV Area (VTI):     1.40 cm AV Vmax:           143.00 cm/s AV Vmean:          93.400 cm/s AV VTI:            0.293 m AV Peak Grad:      8.2 mmHg AV Mean Grad:      4.0 mmHg LVOT Vmax:         87.00 cm/s LVOT Vmean:        55.700 cm/s LVOT  VTI:          0.180 m  LVOT/AV VTI ratio: 0.62  AORTA Ao Root diam: 2.80 cm Ao Asc diam:  3.00 cm MITRAL VALVE               TRICUSPID VALVE MV Area (PHT): 3.30 cm    TR Peak grad:   21.7 mmHg MV Decel Time: 230 msec    TR Vmax:        233.00 cm/s MV E velocity: 62.10 cm/s MV A velocity: 87.80 cm/s  SHUNTS MV E/A ratio:  0.71        Systemic VTI:  0.18 m                            Systemic Diam: 1.70 cm Maude Emmer MD Electronically signed by Maude Emmer MD Signature Date/Time: 07/27/2022/10:27:37 AM    Final       08/23/2022 Tumor Marker   Patient's tumor was tested for the following markers: CA-125. Results of the tumor marker test revealed 7.2.   09/11/2022 Imaging   CT CHEST ABDOMEN PELVIS W CONTRAST  Result Date: 09/12/2022 CLINICAL DATA:  evaluate treatment response of uterine cancer/carcinosarcoma of uterus/endometrium. * Tracking Code: BO * EXAM: CT CHEST, ABDOMEN, AND PELVIS WITH CONTRAST TECHNIQUE: Multidetector CT imaging of the chest, abdomen and pelvis was performed following the standard protocol during bolus administration of intravenous contrast. RADIATION DOSE REDUCTION: This exam was performed according to the departmental dose-optimization program which includes automated exposure control, adjustment of the mA and/or kV according to patient size and/or use of iterative reconstruction technique. CONTRAST:  80mL OMNIPAQUE  IOHEXOL  300 MG/ML  SOLN COMPARISON:  06/13/2022 abdominopelvic CT. Most recent chest CT 12/01/2021 FINDINGS: CT CHEST FINDINGS Cardiovascular: right Port-A-Cath terminates at the high right atrium. Aortic atherosclerosis. Normal heart size, without pericardial effusion. Lad coronary artery calcification. No central pulmonary embolism, on this non-dedicated study. Mediastinum/Nodes: 1.4 cm right thyroid  nodule. Not clinically significant; no follow-up imaging recommended (ref: J Am Coll Radiol. 2015 Feb;12(2): 143-50). No supraclavicular adenopathy. No axillary adenopathy. Right axillary node  dissection. No mediastinal or hilar adenopathy. Esophageal fluid level on 23/2. Lungs/Pleura: No pleural fluid. Posterior central left upper lobe 7 x 6 mm nodule on 42/6 is similar to on image 46 of the prior. Musculoskeletal: Right breast implant. No acute osseous abnormality. CT ABDOMEN PELVIS FINDINGS Hepatobiliary: There may be a tiny cyst in segment 4A anteriorly. Normal gallbladder, without biliary ductal dilatation. Pancreas: Normal, without mass or ductal dilatation. Spleen: Normal in size, without focal abnormality. Adrenals/Urinary Tract: Normal adrenal glands. Normal kidneys, without hydronephrosis. Normal urinary bladder. Stomach/Bowel: Apparent proximal gastric wall thickening including on 43/2 is most likely due to underdistention. Normal distal stomach. Extensive colonic diverticulosis. Normal terminal ileum. Normal small bowel. Vascular/Lymphatic: Aortic atherosclerosis. Index left periaortic node measures 1.2 x 1.7 cm on 71/2 versus 1.3 x 1.7 cm on the prior. A preaortic/pre caval 12 mm node on 72/2 is enlarged from 7 mm on the prior exam (when remeasured). More cephalad left periaortic node measures 1.0 cm on 60/2 versus 1.1 cm on the prior. Index right common iliac node measures 9 mm on 82/2 versus 13 mm on the prior. Right external iliac centrally necrotic node measures 1.4 cm on 95/2 versus 1.2 cm on the prior. Dominant left external iliac nodal mass measures 3.1 x 3.4 cm on 92/2 versus 2.3 x 2.8 cm on the prior exam Reproductive: Hysterectomy.  No  adnexal mass. Other: No significant free fluid. No evidence of omental or peritoneal disease. Musculoskeletal: Osteopenia. S shaped thoracolumbar spine curvature. IMPRESSION: CT CHEST IMPRESSION 1. 7 x 6 mm left upper lobe pulmonary nodule is similar compared to 12/01/2021. Stability favors a benign etiology. Recommend attention on follow-up to exclude unlikely indolent primary neoplasm or isolated metastasis. 2. No thoracic adenopathy. 3. Coronary  artery atherosclerosis. Aortic Atherosclerosis (ICD10-I70.0). 4. Esophageal air fluid level suggests dysmotility or gastroesophageal reflux. CT ABDOMEN AND PELVIS IMPRESSION 1. Mixed response, with overall progression of nodal metastasis. Although some nodes are smaller, the dominant left pelvic sidewall nodal mass has significantly enlarged. 2. No new sites of metastatic disease identified. Electronically Signed   By: Rockey Kilts M.D.   On: 09/12/2022 11:07   MM 3D SCREENING MAMMOGRAM UNILATERAL LEFT BREAST  Result Date: 09/05/2022 CLINICAL DATA:  Screening. EXAM: DIGITAL SCREENING UNILATERAL LEFT MAMMOGRAM WITH CAD AND TOMOSYNTHESIS TECHNIQUE: Left screening digital craniocaudal and mediolateral oblique mammograms were obtained. Left screening digital breast tomosynthesis was performed. The images were evaluated with computer-aided detection. COMPARISON:  Previous exam(s). ACR Breast Density Category c: The breasts are heterogeneously dense, which may obscure small masses. FINDINGS: The patient has had a right mastectomy. There are no findings suspicious for malignancy. IMPRESSION: No mammographic evidence of malignancy. A result letter of this screening mammogram will be mailed directly to the patient. RECOMMENDATION: Screening mammogram in one year.  (Code:[id number]-L-102M) BI-RADS CATEGORY  1: Negative. Electronically Signed   By: Dobrinka  Dimitrova M.D.   On: 09/05/2022 11:09      10/04/2022 - 10/04/2022 Chemotherapy   Patient is on Treatment Plan : Uterine Carboplatin  + Paclitaxel  + Bevacizumab q21d     10/04/2022 - 09/12/2023 Chemotherapy   Patient is on Treatment Plan : BREAST METASTATIC Fam-Trastuzumab Deruxtecan-nxki  (Enhertu ) (5.4) q21d     11/19/2022 Tumor Marker   Patient's tumor was tested for the following markers: CA-125. Results of the tumor marker test revealed 8.5.   11/29/2022 Imaging   CT ABDOMEN PELVIS W CONTRAST  Result Date: 12/07/2022 CLINICAL DATA:  Uterine sarcoma, assess  treatment response. * Tracking Code: BO * EXAM: CT ABDOMEN AND PELVIS WITH CONTRAST TECHNIQUE: Multidetector CT imaging of the abdomen and pelvis was performed using the standard protocol following bolus administration of intravenous contrast. RADIATION DOSE REDUCTION: This exam was performed according to the departmental dose-optimization program which includes automated exposure control, adjustment of the mA and/or kV according to patient size and/or use of iterative reconstruction technique. CONTRAST:  OMNIPAQUE  IOHEXOL  300 MG/ML  SOLN COMPARISON:  09/08/2022. FINDINGS: Lower chest: Mild scattered pulmonary parenchymal scarring. No acute findings. Heart is enlarged. No pericardial or pleural effusion. Distal esophagus is grossly unremarkable. Hepatobiliary: Tiny low-attenuation lesion in the left hepatic lobe, too small to characterize. Liver and gallbladder are otherwise unremarkable. No biliary ductal dilatation. Pancreas: Negative. Spleen: Tiny low-attenuation lesions in the spleen, likely cysts. Adrenals/Urinary Tract: Adrenal glands are unremarkable. Millimetric low-attenuation lesions in the kidneys, too small to characterize. No specific follow-up necessary. Kidneys are otherwise unremarkable. Ureters are decompressed. Bladder is grossly unremarkable. Stomach/Bowel: Stomach, small bowel, appendix and colon are unremarkable. Vascular/Lymphatic: Atherosclerotic calcification of aorta. Generally similar abdominal and pelvic retroperitoneal lymph nodes with index heterogeneous 11 mm left periaortic lymph node (2/33), stable. Aortocaval lymph node has decreased minimally in size in the interval, now measuring 10 mm (2/35), previously 12 mm. Right external iliac lymph node has also decreased minimally in size, now 9 mm (2/58), previously 14 mm. 10  mm lymph node in the sigmoid mesocolon (2/65), stable. Reproductive: Hysterectomy. Left adnexal heterogeneous cystic and solid mass is stable in size, 3.0 x 3.4  cm (2/54). Other: No free fluid.  Small bilateral inguinal hernias contain fat. Musculoskeletal: Degenerative changes in the spine. No worrisome lytic or sclerotic lesions. Dextroconvex scoliosis. IMPRESSION: 1. Slight response to therapy as evidenced by decrease in size of aortocaval and right external iliac lymph nodes. Left adnexal metastasis, adjacent lymph node in the sigmoid mesocolon and other abdominopelvic retroperitoneal lymph nodes are stable. 2.  Aortic atherosclerosis (ICD10-I70.0). Electronically Signed   By: Newell Eke M.D.   On: 12/07/2022 09:37   ECHOCARDIOGRAM COMPLETE  Result Date: 12/05/2022    ECHOCARDIOGRAM REPORT   Patient Name:   Kayla Mcgrath [facility] Date of Exam: 12/05/2022 Medical Rec #:  [id number]      Height:       59.5 in Accession #:    [id number]     Weight:       142.2 lb Date of Birth:  1945-08-15      BSA:          1.605 m Patient Age:    76 years       BP:           153/72 mmHg Patient Gender: F              HR:           67 bpm. Exam Location:  Outpatient Procedure: 2D Echo, 3D Echo, Color Doppler, Cardiac Doppler and Strain Analysis Indications:    Z51.11 Encounter for antineoplastic chemotheraphy  History:        Patient has prior history of Echocardiogram examinations, most                 recent 07/27/2022. Signs/Symptoms:Bacteremia; Risk                 Factors:Dyslipidemia. Breast cancer.  Sonographer:    Ellouise Mose RDCS Referring Phys: [id number] NI GORSUCH IMPRESSIONS  1. Left ventricular ejection fraction, by estimation, is 60 to 65%. Left ventricular ejection fraction by 3D volume is 64 %. The left ventricle has normal function. The left ventricle has no regional wall motion abnormalities. Left ventricular diastolic  parameters were normal. The average left ventricular global longitudinal strain is -22.6 %. The global longitudinal strain is normal.  2. Right ventricular systolic function is normal. The right ventricular size is normal. Tricuspid regurgitation signal is  inadequate for assessing PA pressure.  3. The mitral valve is normal in structure. No evidence of mitral valve regurgitation. No evidence of mitral stenosis.  4. The aortic valve is tricuspid. Aortic valve regurgitation is not visualized. No aortic stenosis is present.  5. The inferior vena cava is normal in size with greater than 50% respiratory variability, suggesting right atrial pressure of 3 mmHg. FINDINGS  Left Ventricle: Left ventricular ejection fraction, by estimation, is 60 to 65%. Left ventricular ejection fraction by 3D volume is 64 %. The left ventricle has normal function. The left ventricle has no regional wall motion abnormalities. The average left ventricular global longitudinal strain is -22.6 %. The global longitudinal strain is normal. The left ventricular internal cavity size was normal in size. There is no left ventricular hypertrophy. Left ventricular diastolic parameters were normal. Right Ventricle: The right ventricular size is normal. No increase in right ventricular wall thickness. Right ventricular systolic function is normal. Tricuspid regurgitation signal is inadequate for assessing PA pressure. Left Atrium:  Left atrial size was normal in size. Right Atrium: Right atrial size was normal in size. Pericardium: There is no evidence of pericardial effusion. Mitral Valve: The mitral valve is normal in structure. No evidence of mitral valve regurgitation. No evidence of mitral valve stenosis. Tricuspid Valve: The tricuspid valve is normal in structure. Tricuspid valve regurgitation is trivial. Aortic Valve: The aortic valve is tricuspid. Aortic valve regurgitation is not visualized. No aortic stenosis is present. Pulmonic Valve: The pulmonic valve was not well visualized. Pulmonic valve regurgitation is not visualized. Aorta: The aortic root and ascending aorta are structurally normal, with no evidence of dilitation. Venous: The inferior vena cava is normal in size with greater than 50%  respiratory variability, suggesting right atrial pressure of 3 mmHg. IAS/Shunts: The interatrial septum was not well visualized.  LEFT VENTRICLE PLAX 2D LVIDd:         4.20 cm         Diastology LVIDs:         2.80 cm         LV e' medial:    7.51 cm/s LV PW:         0.90 cm         LV E/e' medial:  12.7 LV IVS:        0.80 cm         LV e' lateral:   9.68 cm/s LVOT diam:     2.30 cm         LV E/e' lateral: 9.8 LV SV:         88 LV SV Index:   55              2D LVOT Area:     4.15 cm        Longitudinal                                Strain                                2D Strain GLS  -22.6 % LV Volumes (MOD)               Avg: LV vol d, MOD    71.9 ml A2C:                           3D Volume EF LV vol d, MOD    76.0 ml       LV 3D EF:    Left A4C:                                        ventricul LV vol s, MOD    25.3 ml                    ar A2C:                                        ejection LV vol s, MOD    29.1 ml                    fraction A4C:  by 3D LV SV MOD A2C:   46.6 ml                    volume is LV SV MOD A4C:   76.0 ml                    64 %. LV SV MOD BP:    51.5 ml                                 3D Volume EF:                                3D EF:        64 %                                LV EDV:       124 ml                                LV ESV:       45 ml                                LV SV:        79 ml RIGHT VENTRICLE             IVC RV S prime:     12.20 cm/s  IVC diam: 1.70 cm TAPSE (M-mode): 2.3 cm LEFT ATRIUM             Index        RIGHT ATRIUM           Index LA diam:        2.70 cm 1.68 cm/m   RA Area:     11.10 cm LA Vol (A2C):   54.4 ml 33.90 ml/m  RA Volume:   22.90 ml  14.27 ml/m LA Vol (A4C):   19.3 ml 12.03 ml/m LA Biplane Vol: 35.8 ml 22.31 ml/m  AORTIC VALVE LVOT Vmax:   108.00 cm/s LVOT Vmean:  67.800 cm/s LVOT VTI:    0.212 m  AORTA Ao Root diam: 3.10 cm Ao Asc diam:  3.10 cm MITRAL VALVE MV Area (PHT): 3.88 cm     SHUNTS  MV Decel Time: 196 msec     Systemic VTI:  0.21 m MV E velocity: 95.15 cm/s   Systemic Diam: 2.30 cm MV A velocity: 108.00 cm/s MV E/A ratio:  0.88 Lonni Nanas MD Electronically signed by Lonni Nanas MD Signature Date/Time: 12/05/2022/6:06:56 PM    Final       01/19/2023 Tumor Marker   Patient's tumor was tested for the following markers: CA-125. Results of the tumor marker test revealed 8.4.   02/21/2023 Imaging   CT CHEST ABDOMEN PELVIS W CONTRAST Result Date: 02/28/2023 CLINICAL DATA:  Uterine sarcoma, lung nodules, ongoing immunotherapy, chemotherapy and XRT complete additional history of right breast cancer * Tracking Code: BO * EXAM: CT CHEST, ABDOMEN, AND PELVIS WITH CONTRAST TECHNIQUE: Multidetector CT imaging of the chest, abdomen and pelvis was performed following the standard protocol during bolus administration of intravenous contrast. RADIATION DOSE REDUCTION: This exam was performed according  to the departmental dose-optimization program which includes automated exposure control, adjustment of the mA and/or kV according to patient size and/or use of iterative reconstruction technique. CONTRAST:  OMNIPAQUE  IOHEXOL  300 MG/ML  SOLN COMPARISON:  CT abdomen pelvis, 11/29/2022, CT chest abdomen pelvis, 09/08/2022 FINDINGS: CT CHEST FINDINGS Cardiovascular: Right chest port catheter. Normal heart size. Left coronary artery calcifications. No pericardial effusion. Mediastinum/Nodes: No enlarged mediastinal, hilar, or axillary lymph nodes. Thyroid  gland, trachea, and esophagus demonstrate no significant findings. Lungs/Pleura: Unchanged nodule of the posterior medial left upper lobe measuring 0.8 x 0.6 cm (series 6, image 41). No pleural effusion or pneumothorax. Musculoskeletal: Status post right mastectomy and axillary lymph node dissection with implant reconstruction. No acute osseous findings. CT ABDOMEN PELVIS FINDINGS Hepatobiliary: No solid liver abnormality is seen. No  gallstones, gallbladder wall thickening, or biliary dilatation. Pancreas: Unremarkable. No pancreatic ductal dilatation or surrounding inflammatory changes. Spleen: Normal in size without significant abnormality. Adrenals/Urinary Tract: Adrenal glands are unremarkable. Kidneys are normal, without renal calculi, solid lesion, or hydronephrosis. Bladder is unremarkable. Stomach/Bowel: Stomach is within normal limits. Appendix not clearly visualized. No evidence of bowel wall thickening, distention, or inflammatory changes. Descending and sigmoid diverticulosis. Vascular/Lymphatic: Scattered aortic atherosclerosis. Unchanged sigmoid mesocolon lymph node measuring 1.3 x 1.1 cm (series 2, image 90). Unchanged right external iliac lymph node measuring 1.1 x 0.8 cm (series 2, image 73). Unchanged retroperitoneal lymph nodes, left retroperitoneal lymph node measuring 1.5 x 1.1 cm (series 2, image 68). Reproductive: Status post hysterectomy. Diminished size of a mixed solid and cystic left iliac lymph node or adnexal metastasis, measuring 2.3 x 1.5 cm, previously 3.4 x 3.0 cm (series 2, image 90). Other: No abdominal wall hernia or abnormality. Trace free fluid in the low pelvis. Musculoskeletal: No acute osseous findings. S shaped scoliosis of the thoracolumbar spine. IMPRESSION: 1. Diminished size of a mixed solid and cystic left iliac lymph node or adnexal metastasis, measuring 2.3 x 1.5 cm, previously 3.4 x 3.0 cm consistent with treatment response. 2. Unchanged sigmoid mesocolon, right external iliac, and retroperitoneal lymph nodes. 3. Unchanged pulmonary nodule of the posterior medial left upper lobe. 4. Status post hysterectomy and right mastectomy. 5. Coronary artery disease. Electronically Signed   By: Marolyn JONETTA Jaksch M.D.   On: 02/28/2023 09:34      03/02/2023 Tumor Marker   Patient's tumor was tested for the following markers: CA-125. Results of the tumor marker test revealed 6.5.   03/20/2023 Echocardiogram      1. Left ventricular ejection fraction, by estimation, is 60 to 65%. The left ventricle has normal function. The left ventricle has no regional wall motion abnormalities. Left ventricular diastolic parameters are consistent with Grade I diastolic  dysfunction (impaired relaxation). The average left ventricular global longitudinal strain is -22.5 %. The global longitudinal strain is normal.  2. Right ventricular systolic function is normal. The right ventricular size is normal. There is normal pulmonary artery systolic pressure.  3. Left atrial size was mildly dilated.  4. The mitral valve is normal in structure. Trivial mitral valve regurgitation. No evidence of mitral stenosis.  5. The aortic valve is normal in structure. Aortic valve regurgitation is not visualized. No aortic stenosis is present.  6. The inferior vena cava is normal in size with greater than 50% respiratory variability, suggesting right atrial pressure of 3 mmHg.   03/22/2023 Tumor Marker   Patient's tumor was tested for the following markers: CA-125. Results of the tumor marker test revealed 8.3.   05/16/2023  Imaging   Similar retroperitoneal as well as left internal and external iliac chain adenopathy.     06/19/2023 Echocardiogram   1. Left ventricular ejection fraction, by estimation, is 60 to 65%. The left ventricle has normal function. The left ventricle has no regional wall motion abnormalities. Left ventricular diastolic parameters are indeterminate. The average left ventricular global longitudinal strain is -24.9 %. The global longitudinal strain is normal.  2. Right ventricular systolic function is normal. The right ventricular size is normal. There is normal pulmonary artery systolic pressure.  3. Left atrial size was mild to moderately dilated.  4. The mitral valve is normal in structure. Trivial mitral valve regurgitation. No evidence of mitral stenosis.  5. The aortic valve is grossly normal. There is mild  calcification of the aortic valve. There is mild thickening of the aortic valve. Aortic valve regurgitation is not visualized. Aortic valve sclerosis/calcification is present, without any evidence of aortic stenosis.  6. The inferior vena cava is dilated in size with >50% respiratory variability, suggesting right atrial pressure of 8 mmHg.   10/03/2023 Imaging   CT CHEST ABDOMEN PELVIS W CONTRAST Result Date: 10/03/2023 CLINICAL DATA:  Restaging uterine carcinoma.   * Tracking Code: BO * EXAM: CT CHEST, ABDOMEN, AND PELVIS WITH CONTRAST TECHNIQUE: Multidetector CT imaging of the chest, abdomen and pelvis was performed following the standard protocol during bolus administration of intravenous contrast. RADIATION DOSE REDUCTION: This exam was performed according to the departmental dose-optimization program which includes automated exposure control, adjustment of the mA and/or kV according to patient size and/or use of iterative reconstruction technique. CONTRAST:  OMNIPAQUE  IOHEXOL  300 MG/ML  SOLN COMPARISON:  CT 05/16/2023 abdomen pelvis, 02/21/2023 chest abdomen pelvis FINDINGS: CT CHEST FINDINGS Cardiovascular: No significant vascular findings. Normal heart size. No pericardial effusion. Port in the anterior chest wall with tip in distal SVC. Mediastinum/Nodes: No axillary or supraclavicular adenopathy. No mediastinal or hilar adenopathy. No pericardial fluid. Esophagus normal. Lungs/Pleura: Rounded solid nodule in the central LEFT upper lobe measures 8 mm (image 42/4) compared 8 mm. Solid nodule in the central LEFT upper lobe measuring 8 mm on image 42/4 not changed from prior. Sub solid nodule in the posterior RIGHT upper lobe measuring 12 mm image 40 is new from prior. Musculoskeletal: No aggressive osseous lesion. Post RIGHT mastectomy anatomy CT ABDOMEN AND PELVIS FINDINGS Hepatobiliary: No focal hepatic lesion. No biliary ductal dilatation. Gallbladder is normal. Common bile duct is normal.  Pancreas: Pancreas is normal. No ductal dilatation. No pancreatic inflammation. Spleen: Normal spleen Adrenals/urinary tract: Adrenal glands and kidneys are normal. The ureters and bladder normal. Stomach/Bowel: The stomach, duodenum, and small bowel normal. Multiple diverticula of the descending colon and sigmoid colon without acute inflammation. Vascular/Lymphatic: Abdominal aorta is normal caliber. No upper abdominal adenopathy. Lymph node bifurcation measures 9 mm image 71 compared to 11 mm. Lymph node in the presacral space measures 9 mm on image 92 compared to 10 mm. Reproductive: Post hysterectomy anatomy. Cystic and solid rounded mass in the LEFT adnexa with peripheral enhancement and central nodularity measures 4.8 x 4.3 cm compared to 1.5 by 2.2 cm. The cystic and solid mass measures 6.9 cm in craniocaudad dimension on image 73/5. Other: None Musculoskeletal: No aggressive osseous lesion. IMPRESSION: CHEST: 1. Stable LEFT upper lobe pulmonary nodules. 2. New sub solid nodule in the RIGHT upper lobe. Favor inflammatory nodule. Recommend attention on routine follow-up. 3. No mediastinal adenopathy. PELVIS: 1. Interval increase in size of cystic and solid mass in  the LEFT adnexa consistent with uterine carcinoma recurrence. 2. Stable small pelvic lymph nodes. 3. No new adenopathy in the abdomen pelvis. 4. No liver metastasis. Electronically Signed   By: Jackquline Boxer M.D.   On: 10/03/2023 15:33   MM 3D SCREENING MAMMOGRAM UNILATERAL LEFT BREAST Result Date: 09/06/2023 CLINICAL DATA:  Screening. EXAM: DIGITAL SCREENING UNILATERAL LEFT MAMMOGRAM WITH CAD AND TOMOSYNTHESIS TECHNIQUE: Left screening digital craniocaudal and mediolateral oblique mammograms were obtained. Left screening digital breast tomosynthesis was performed. The images were evaluated with computer-aided detection. COMPARISON:  Previous exam(s). ACR Breast Density Category c: The breasts are heterogeneously dense, which may obscure small  masses. FINDINGS: The patient has had a right mastectomy. There are no findings suspicious for malignancy. IMPRESSION: No mammographic evidence of malignancy. A result letter of this screening mammogram will be mailed directly to the patient. RECOMMENDATION: Screening mammogram in one year.  (Code:SM-L-22M) BI-RADS CATEGORY  1: Negative. Electronically Signed   By: Dina  Arceo M.D.   On: 09/06/2023 15:43      10/16/2023 -  Chemotherapy   Patient is on Treatment Plan : UTERINE Lenvatinib  (20) D1-21 + Pembrolizumab  (200) D1 q21d     12/25/2023 Imaging   MR Brain W Wo Contrast Result Date: 12/27/2023 CLINICAL DATA:  Metastatic uterine cancer, memory loss EXAM: MRI HEAD WITHOUT AND WITH CONTRAST TECHNIQUE: Multiplanar, multiecho pulse sequences of the brain and surrounding structures were obtained without and with intravenous contrast. CONTRAST:  5mL GADAVIST  GADOBUTROL  1 MMOL/ML IV SOLN COMPARISON:  None Available. FINDINGS: MRI brain: The brain volume and hippocampi are normal. There are a few small foci of T2 hyperintensity in the cerebral white matter. These do not have restricted diffusion. No abnormal enhancement. There is no acute or chronic infarct. The ventricles are normal. No mass lesion. There are normal flow signals in the carotid arteries and basilar artery. No significant bone marrow signal abnormality. No significant abnormality in the paranasal sinuses or soft tissues. IMPRESSION: No significant abnormality Electronically Signed   By: Nancyann Burns M.D.   On: 12/27/2023 09:56        History of Present Illness: The patient's records from the referring physician were obtained and reviewed and the patient interviewed to confirm this HPI.  Kayla Mcgrath presents today to discuss cognitive changes since beginning treatment for uterine cancer.  She describes modest impairment in attention, processing speed and short term memory.  There is increased anxiety and mood lability as well. It is more  difficult for her to care for her husband with dementia because of cognitive issues.  Otherwise denies focal complaints, no seizures, headaches.  Currently on keytruda  and lenvatinib  with Dr. Lonn.  Medications: Medications Ordered Prior to Encounter[1]  Allergies: Allergies[2] Past Medical History:  Past Medical History:  Diagnosis Date   Adjustment disorder with anxious mood 12/05/2018   Anemia    Anxiety    Arthralgia 06/29/2015   Arthritis    Breast cancer (HCC) 2010   Colon polyps    ?   Complication of anesthesia    DCIS (ductal carcinoma in situ) of breast 2010   Depression    Diverticulosis    Family history of stomach cancer    Full dentures    Hyperlipidemia    Joint pain    Leukocytosis 07/11/2017   PONV (postoperative nausea and vomiting)    Woke up during surgery   Skin cancer    SCC   Uterine cancer (HCC) 11/2021   Wears glasses  Past Surgical History:  Past Surgical History:  Procedure Laterality Date   BREAST BIOPSY Right 05/11/2012   BREAST BIOPSY Right 04/24/2012   BREAST LUMPECTOMY Right 12/2008   cancer-tamoxifen    BREAST RECONSTRUCTION WITH PLACEMENT OF TISSUE EXPANDER AND FLEX HD (ACELLULAR HYDRATED DERMIS) Right 06/20/2012   Procedure: BREAST RECONSTRUCTION WITH PLACEMENT OF TISSUE EXPANDER AND FLEX HD (ACELLULAR HYDRATED DERMIS);  Surgeon: Estefana Reichert, DO;  Location: [facility];  Service: Plastics;  Laterality: Right;   BREAST REDUCTION WITH MASTOPEXY Left 10/11/2012   Procedure: LEFT BREAST REDUCTION WITH MASTOPEXY;  Surgeon: Estefana Reichert, DO;  Location: [facility];  Service: Plastics;  Laterality: Left;   cataract sugery Bilateral 03/2019   COLONOSCOPY  08/28/2013   [facility]   DILATION AND CURETTAGE OF UTERUS     heavy bleeding between two pregnancies   IR IMAGING GUIDED PORT INSERTION  01/12/2022   LAPAROTOMY N/A 01/12/2022   Procedure: incision drainage of abdominal incision wound;  Surgeon:  Rogelio Planas, MD;  Location: [facility];  Service: Gynecology;  Laterality: N/A;   LIPOSUCTION WITH LIPOFILLING Left 10/11/2012   Procedure: LIPOSUCTION WITH LIPOFILLING;  Surgeon: Estefana Reichert, DO;  Location: [facility];  Service: Plastics;  Laterality: Left;   LYMPH NODE DISSECTION N/A 12/22/2021   Procedure: LYMPH NODE DISSECTION;  Surgeon: Viktoria Comer SAUNDERS, MD;  Location: [facility];  Service: Gynecology;  Laterality: N/A;   MASTECTOMY Right 06/20/2012   MASTECTOMY W/ SENTINEL NODE BIOPSY Right 06/20/2012   Procedure: right skin sparing MASTECTOMY WITH SENTINEL LYMPH NODE BIOPSY;  Surgeon: Jina Nephew, MD;  Location: [facility];  Service: General;  Laterality: Right;   PARATHYROIDECTOMY Left 11/07/2017   Procedure: NECK EXPLORATION WITH LEFT THYROID  LOBECTOMY;  Surgeon: Eletha Boas, MD;  Location: [facility];  Service: General;  Laterality: Left;   POLYPECTOMY     REDUCTION MAMMAPLASTY Left 10/2012   REMOVAL OF TISSUE EXPANDER AND PLACEMENT OF IMPLANT Right 10/11/2012   Procedure: REMOVAL OF TISSUE EXPANDER AND PLACEMENT OF SILICONE IMPLANT RIGHT;  Surgeon: Estefana Reichert, DO;  Location:  SUR[facility]ERY C[facility];  Service: Plastics;  Laterality: Right;   Social History:  Social History   Socioeconomic History   Marital status: Married    Spouse name: Not on file   Number of children: 2   Years of education: Not on file   Highest education level: Not on file  Occupational History   Occupation: retired LPN  Tobacco Use   Smoking status: Former    Current packs/day: 0.00    Types: Cigarettes    Quit date: 06/14/1981    Years since quitting: 42.6   Smokeless tobacco: Never  Vaping Use   Vaping status: Never Used  Substance and Sexual Activity   Alcohol use: No   Drug use: No   Sexual activity: Not Currently    Birth control/protection: Post-menopausal  Other Topics Concern   Not on file  Social History Narrative   Recently moved from [place] to be  closer to her two daughters and grandchildren.   Retired PUBLIC HOUSE MANAGER.   DNR- forms singed on 05/07/2012 and returned to pt.   Social Drivers of Health   Tobacco Use: Medium Risk (01/12/2024)   Patient History    Smoking Tobacco Use: Former    Smokeless Tobacco Use: Never    Passive Exposure: Not on file  Financial Resource Strain: Low Risk (02/02/2021)   Overall Financial Resource Strain (CARDIA)    Difficulty of Paying Living Expenses:  Not hard at all  Food Insecurity: No Food Insecurity (01/25/2024)   Epic    Worried About Programme Researcher, Broadcasting/film/video in the Last Year: Never true    Ran Out of Food in the Last Year: Never true  Transportation Needs: No Transportation Needs (01/25/2024)   Epic    Lack of Transportation (Medical): No    Lack of Transportation (Non-Medical): No  Physical Activity: Insufficiently Active (02/02/2021)   Exercise Vital Sign    Days of Exercise per Week: 3 days    Minutes of Exercise per Session: 30 min  Stress: No Stress Concern Present (02/02/2021)   [employer] of Occupational Health - Occupational Stress Questionnaire    Feeling of Stress : Not at all  Social Connections: Moderately Integrated (02/02/2021)   Social Connection and Isolation Panel    Frequency of Communication with Friends and Family: Twice a week    Frequency of Social Gatherings with Friends and Family: Twice a week    Attends Religious Services: Never    Database Administrator or Organizations: Yes    Attends Engineer, Structural: More than 4 times per year    Marital Status: Married  Catering Manager Violence: Not At Risk (01/25/2024)   Epic    Fear of Current or Ex-Partner: No    Emotionally Abused: No    Physically Abused: No    Sexually Abused: No  Depression (PHQ2-9): Low Risk (01/25/2024)   Depression (PHQ2-9)    PHQ-2 Score: 1  Alcohol Screen: Low Risk (02/02/2021)   Alcohol Screen    Last Alcohol Screening Score (AUDIT): 0  Housing: Low Risk (01/25/2024)   Epic    Unable to  Pay for Housing in the Last Year: No    Number of Times Moved in the Last Year: 0    Homeless in the Last Year: No  Utilities: Not At Risk (01/25/2024)   Epic    Threatened with loss of utilities: No  Health Literacy: Not on file   Family History:  Family History  Problem Relation Age of Onset   Alcohol abuse Mother    Colon polyps Mother    Mental illness Mother    Alcohol abuse Father        suicide   Mental illness Father    Cervical cancer Sister 44   Stomach cancer Paternal Grandmother    Heart disease Paternal Grandfather    Colon cancer Neg Hx    Esophageal cancer Neg Hx    Rectal cancer Neg Hx    Breast cancer Neg Hx    Ovarian cancer Neg Hx    Endometrial cancer Neg Hx    Pancreatic cancer Neg Hx    Prostate cancer Neg Hx     Review of Systems: Constitutional: Doesn't report fevers, chills or abnormal weight loss Eyes: Doesn't report blurriness of vision Ears, nose, mouth, throat, and face: Doesn't report sore throat Respiratory: Doesn't report cough, dyspnea or wheezes Cardiovascular: Doesn't report palpitation, chest discomfort  Gastrointestinal:  Doesn't report nausea, constipation, diarrhea GU: Doesn't report incontinence Skin: Doesn't report skin rashes Neurological: Per HPI Musculoskeletal: Doesn't report joint pain Behavioral/Psych: +anxiety  Physical Exam: Vitals:   01/25/24 0905  BP: 137/88  Pulse: 94  Resp: 18  Temp: 98.3 F (36.8 C)  SpO2: 100%   General: Alert, cooperative, pleasant, in no acute distress Head: Normal EENT: No conjunctival injection or scleral icterus.  Lungs: Resp effort normal Cardiac: Regular rate Abdomen: Non-distended abdomen Skin: No rashes  cyanosis or petechiae. Extremities: No clubbing or edema  Neurologic Exam: Mental Status: Awake, alert, attentive to examiner. Oriented to self and environment. Language is fluent with intact comprehension.  Cranial Nerves: Visual acuity is grossly normal. Visual fields are  full. Extra-ocular movements intact. No ptosis. Face is symmetric Motor: Tone and bulk are normal. Power is full in both arms and legs.  Sensory: Intact to light touch Gait: Normal.   Labs: I have reviewed the data as listed    Component Value Date/Time   NA 140 01/09/2024 1315   NA 140 11/02/2016 1130   K 3.4 (L) 01/09/2024 1315   K 3.8 11/02/2016 1130   CL 104 01/09/2024 1315   CL 105 05/02/2012 0822   CO2 25 01/09/2024 1315   CO2 26 11/02/2016 1130   GLUCOSE 94 01/09/2024 1315   GLUCOSE 87 11/02/2016 1130   GLUCOSE 98 05/02/2012 0822   BUN 13 01/09/2024 1315   BUN 14.7 11/02/2016 1130   CREATININE 1.12 (H) 01/09/2024 1315   CREATININE 0.84 12/05/2018 1014   CREATININE 0.8 11/02/2016 1130   CALCIUM  9.3 01/09/2024 1315   CALCIUM  9.7 11/02/2016 1130   PROT 7.2 01/09/2024 1315   PROT 6.8 11/02/2016 1130   ALBUMIN 3.8 01/09/2024 1315   ALBUMIN 3.6 11/02/2016 1130   AST 42 (H) 01/09/2024 1315   AST 23 11/02/2016 1130   ALT 24 01/09/2024 1315   ALT 17 11/02/2016 1130   ALKPHOS 215 (H) 01/09/2024 1315   ALKPHOS 59 11/02/2016 1130   BILITOT 0.6 01/09/2024 1315   BILITOT 0.34 11/02/2016 1130   GFRNONAA 50 (L) 01/09/2024 1315   GFRAA >60 11/08/2017 0504   GFRAA >60 07/25/2017 1425   Lab Results  Component Value Date   WBC 5.5 01/09/2024   NEUTROABS 3.5 01/09/2024   HGB 12.5 01/09/2024   HCT 37.2 01/09/2024   MCV 89.2 01/09/2024   PLT 127 (L) 01/09/2024    Imaging: CLINICAL DATA:  Metastatic uterine cancer, memory loss   EXAM: MRI HEAD WITHOUT AND WITH CONTRAST   TECHNIQUE: Multiplanar, multiecho pulse sequences of the brain and surrounding structures were obtained without and with intravenous contrast.   CONTRAST:  5mL GADAVIST  GADOBUTROL  1 MMOL/ML IV SOLN   COMPARISON:  None Available.   FINDINGS: MRI brain:   The brain volume and hippocampi are normal.   There are a few small foci of T2 hyperintensity in the cerebral white matter. These do not have  restricted diffusion.   No abnormal enhancement.   There is no acute or chronic infarct.   The ventricles are normal.   No mass lesion.   There are normal flow signals in the carotid arteries and basilar artery.   No significant bone marrow signal abnormality.   No significant abnormality in the paranasal sinuses or soft tissues.   IMPRESSION: No significant abnormality     Electronically Signed   By: Nancyann Burns M.D.   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     Brain MRI was reviewed; no neoplasm, structural disease, or atrophy noted.  Bedside cognitive screening today was in normal range for age.   We provided counseling regarding healthy behaviors to maintain cognitive function, including exercise, diet, and positive outlook.  We discussed mindful relaxation and provided some methods to redirect anxiety without medication.   No further CNS workup or cognitive screening  recommended at this time.  We spent twenty additional minutes teaching regarding the natural history, biology, and historical experience in the treatment of neurologic complications of cancer.   We appreciate the opportunity to participate in the care of Kayla Mcgrath.  We encouraged follow up for progressive cognitive issues or neurologic symptoms if they develop.  All questions were answered. The patient knows to call the clinic with any problems, questions or concerns. No barriers to learning were detected.  The total time spent in the encounter was 40 minutes and more than 50% was on counseling and review of test results   Arthea MARLA Manns, MD Medical Director of Neuro-Oncology [facility] 01/25/24 9:58 AM    [1]  Current Outpatient Medications on File Prior to Visit  Medication Sig Dispense Refill    escitalopram  (LEXAPRO ) 10 MG tablet TAKE 1 TABLET EVERY DAY (Patient taking differently: Take 5 mg by mouth daily.) 90 tablet 3   lenvatinib  10 mg daily dose (LENVIMA ) capsule Take 1 capsule (10 mg total) by mouth daily. 30 capsule 11   levothyroxine  (SYNTHROID ) 50 MCG tablet TAKE 1 TABLET BY MOUTH DAILY BEFORE BREAKFAST 60 tablet 0   loratadine (CLARITIN) 10 MG tablet Take 10 mg by mouth daily as needed for allergies.     pantoprazole  (PROTONIX ) 40 MG tablet TAKE 1 TABLET BY MOUTH EVERY DAY 90 tablet 1   VITAMIN D , CHOLECALCIFEROL, PO Take 1 tablet by mouth daily.     Cyanocobalamin  (VITAMIN B 12 PO) Take 1 tablet by mouth. (Patient not taking: Reported on 01/25/2024)     ondansetron  (ZOFRAN ) 8 MG tablet Take 1 tablet (8 mg total) by mouth every 8 (eight) hours as needed for nausea or vomiting. Start on the third day after chemotherapy. (Patient not taking: Reported on 01/25/2024) 30 tablet 1   prochlorperazine  (COMPAZINE ) 10 MG tablet Take 1 tablet (10 mg total) by mouth every 6 (six) hours as needed for nausea or vomiting. (Patient not taking: Reported on 01/25/2024) 90 tablet 1   traMADol  (ULTRAM ) 50 MG tablet Take 1 tablet (50 mg total) by mouth every 6 (six) hours as needed. (Patient not taking: Reported on 01/25/2024) 60 tablet 0   No current facility-administered medications on file prior to visit.  [2]  Allergies Allergen Reactions   Tylenol  [Acetaminophen ] Hives and Swelling    Lip swelling   Latex Rash   "

## 2024-01-31 ENCOUNTER — Other Ambulatory Visit: Payer: Self-pay

## 2024-02-01 ENCOUNTER — Other Ambulatory Visit: Payer: Self-pay

## 2024-02-01 ENCOUNTER — Telehealth: Payer: Self-pay

## 2024-02-01 ENCOUNTER — Encounter: Payer: Self-pay | Admitting: Hematology and Oncology

## 2024-02-01 ENCOUNTER — Inpatient Hospital Stay

## 2024-02-01 ENCOUNTER — Inpatient Hospital Stay: Admitting: Hematology and Oncology

## 2024-02-01 VITALS — BP 133/79 | HR 81 | Resp 18

## 2024-02-01 VITALS — BP 143/89 | HR 83 | Temp 97.6°F | Resp 18 | Ht 59.0 in | Wt 125.2 lb

## 2024-02-01 DIAGNOSIS — R7989 Other specified abnormal findings of blood chemistry: Secondary | ICD-10-CM

## 2024-02-01 DIAGNOSIS — N183 Chronic kidney disease, stage 3 unspecified: Secondary | ICD-10-CM | POA: Diagnosis not present

## 2024-02-01 DIAGNOSIS — Z5112 Encounter for antineoplastic immunotherapy: Secondary | ICD-10-CM | POA: Diagnosis not present

## 2024-02-01 DIAGNOSIS — C55 Malignant neoplasm of uterus, part unspecified: Secondary | ICD-10-CM | POA: Diagnosis not present

## 2024-02-01 DIAGNOSIS — D696 Thrombocytopenia, unspecified: Secondary | ICD-10-CM | POA: Diagnosis not present

## 2024-02-01 LAB — CMP (CANCER CENTER ONLY)
ALT: 33 U/L (ref 0–44)
AST: 50 U/L — ABNORMAL HIGH (ref 15–41)
Albumin: 3.5 g/dL (ref 3.5–5.0)
Alkaline Phosphatase: 235 U/L — ABNORMAL HIGH (ref 38–126)
Anion gap: 11 (ref 5–15)
BUN: 13 mg/dL (ref 8–23)
CO2: 26 mmol/L (ref 22–32)
Calcium: 8.8 mg/dL — ABNORMAL LOW (ref 8.9–10.3)
Chloride: 103 mmol/L (ref 98–111)
Creatinine: 1.17 mg/dL — ABNORMAL HIGH (ref 0.44–1.00)
GFR, Estimated: 48 mL/min — ABNORMAL LOW
Glucose, Bld: 97 mg/dL (ref 70–99)
Potassium: 3.5 mmol/L (ref 3.5–5.1)
Sodium: 139 mmol/L (ref 135–145)
Total Bilirubin: 0.4 mg/dL (ref 0.0–1.2)
Total Protein: 6.6 g/dL (ref 6.5–8.1)

## 2024-02-01 LAB — CBC WITH DIFFERENTIAL (CANCER CENTER ONLY)
Abs Immature Granulocytes: 0.01 K/uL (ref 0.00–0.07)
Basophils Absolute: 0 K/uL (ref 0.0–0.1)
Basophils Relative: 0 %
Eosinophils Absolute: 0.1 K/uL (ref 0.0–0.5)
Eosinophils Relative: 2 %
HCT: 35.6 % — ABNORMAL LOW (ref 36.0–46.0)
Hemoglobin: 12 g/dL (ref 12.0–15.0)
Immature Granulocytes: 0 %
Lymphocytes Relative: 18 %
Lymphs Abs: 1.1 K/uL (ref 0.7–4.0)
MCH: 30.6 pg (ref 26.0–34.0)
MCHC: 33.7 g/dL (ref 30.0–36.0)
MCV: 90.8 fL (ref 80.0–100.0)
Monocytes Absolute: 0.6 K/uL (ref 0.1–1.0)
Monocytes Relative: 10 %
Neutro Abs: 4.2 K/uL (ref 1.7–7.7)
Neutrophils Relative %: 70 %
Platelet Count: 113 K/uL — ABNORMAL LOW (ref 150–400)
RBC: 3.92 MIL/uL (ref 3.87–5.11)
RDW: 17.6 % — ABNORMAL HIGH (ref 11.5–15.5)
WBC Count: 6 K/uL (ref 4.0–10.5)
nRBC: 0 % (ref 0.0–0.2)

## 2024-02-01 LAB — TSH: TSH: 10.6 u[IU]/mL — ABNORMAL HIGH (ref 0.350–4.500)

## 2024-02-01 MED ORDER — LEVOTHYROXINE SODIUM 75 MCG PO TABS: 75.0000 ug | ORAL_TABLET | Freq: Every day | ORAL | 0 refills | Status: AC

## 2024-02-01 MED ORDER — SODIUM CHLORIDE 0.9 % IV SOLN: INTRAVENOUS | Status: DC

## 2024-02-01 MED ORDER — LEVOTHYROXINE SODIUM 75 MCG PO TABS: 75.0000 ug | ORAL_TABLET | Freq: Every day | ORAL | 0 refills | Status: DC

## 2024-02-01 MED ORDER — SODIUM CHLORIDE 0.9 % IV SOLN
200.0000 mg | Freq: Once | INTRAVENOUS | Status: AC
  Administered 2024-02-01: 200 mg via INTRAVENOUS
  Filled 2024-02-01: qty 200

## 2024-02-01 NOTE — Telephone Encounter (Signed)
-----   Message from Almarie Bedford, MD sent at 02/01/2024  2:37 PM EST ----- TSH is not coming up Increase synthroid  to 75 mcg, call in 30 tabs no refills

## 2024-02-01 NOTE — Assessment & Plan Note (Addendum)
 She initially presented with postmenopausal bleeding in September 2023, s/p hysterectomy with tumor debulking but lymph nodes were not resectable Final pathology reviewed carcinosarcoma: MMR normal, MSI stable, PD-L1 CPS: 1%, Her2 positive, P53 +, Neg genetics, ER negative  She was treated with combination of carboplatin , paclitaxel  and trastuzumab , completed by August 2024 with disease progression, treatment was subsequently changed to trastuzumab  Deruxtecan with positive response to therapy CT imaging study from May 2025 showed stable disease control but unfortunately, most recent CT imaging from September 2025 showed disease progression with new solid nodule in the right upper lobe worrisome for possible metastatic disease Overall, she has initial primary refractory disease to carboplatin , paclitaxel , trastuzumab  and now have resistant disease to trastuzumab  Deruxtecan The patient ultimately wants to proceed with further palliative chemotherapy  She tolerated treatment with combination of pembrolizumab  and lenvatinib  well She tolerated treatment poorly; this stemmed from new mental fogginess and poor memory as well as new onset of depression that could be due to side effects of Synthroid  versus undiagnosed brain metastasis MRI of the was reviewed with the patient which showed no evidence of metastatic disease She will continue her treatment as directed CT imaging show positive response to therapy We will continue treatment and I plan to repeat imaging study again in April

## 2024-02-01 NOTE — Progress Notes (Signed)
 [facility] OFFICE PROGRESS NOTE  Patient Care Team: Lonn Hicks, MD as PCP - General (Hematology and Oncology) Watt Mirza, MD as Consulting Physician (Family Medicine)  Assessment & Plan Malignant neoplasm of uterus, unspecified site [facility]) She initially presented with postmenopausal bleeding in September 2023, s/p hysterectomy with tumor debulking but lymph nodes were not resectable Final pathology reviewed carcinosarcoma: MMR normal, MSI stable, PD-L1 CPS: 1%, Her2 positive, P53 +, Neg genetics, ER negative  She was treated with combination of carboplatin , paclitaxel  and trastuzumab , completed by August 2024 with disease progression, treatment was subsequently changed to trastuzumab  Deruxtecan with positive response to therapy CT imaging study from May 2025 showed stable disease control but unfortunately, most recent CT imaging from September 2025 showed disease progression with new solid nodule in the right upper lobe worrisome for possible metastatic disease Overall, she has initial primary refractory disease to carboplatin , paclitaxel , trastuzumab  and now have resistant disease to trastuzumab  Deruxtecan The patient ultimately wants to proceed with further palliative chemotherapy  She tolerated treatment with combination of pembrolizumab  and lenvatinib  well She tolerated treatment poorly; this stemmed from new mental fogginess and poor memory as well as new onset of depression that could be due to side effects of Synthroid  versus undiagnosed brain metastasis MRI of the was reviewed with the patient which showed no evidence of metastatic disease She will continue her treatment as directed CT imaging show positive response to therapy We will continue treatment and I plan to repeat imaging study again in April Elevated TSH Due to side effects of treatment I plan to increase the dose of Synthroid  Thrombocytopenia This is likely due to recent treatment. The patient  denies recent history of bleeding such as epistaxis, hematuria or hematochezia. She is asymptomatic from the low platelet count. I will observe for now.  she does not require transfusion now. I will continue the chemotherapy at current dose without dosage adjustment.  If the thrombocytopenia gets progressive worse in the future, I might have to delay her treatment or adjust the chemotherapy dose.  Stage 3 chronic kidney disease, unspecified whether stage 3a or 3b CKD (HCC) She has history of intermittent kidney failure We will monitor that closely  No orders of the defined types were placed in this encounter.    Hicks Lonn, MD  INTERVAL HISTORY: she returns for treatment follow-up Complications related to previous cycle of chemotherapy included thrombocytopenia,, memory changes, abnormal thyroid  function, and abnormal liver enzymes  PHYSICAL EXAMINATION: ECOG PERFORMANCE STATUS: 1 - Symptomatic but completely ambulatory  Lab Results  Component Value Date   CAN125 8.3 03/21/2023   CAN125 6.5 02/28/2023   CAN125 8.4 01/17/2023      Latest Ref Rng & Units 02/01/2024   12:15 PM 01/09/2024    1:15 PM 12/18/2023    1:27 PM  CBC  WBC 4.0 - 10.5 K/uL 6.0  5.5  4.7   Hemoglobin 12.0 - 15.0 g/dL 87.9  87.4  86.6   Hematocrit 36.0 - 46.0 % 35.6  37.2  39.2   Platelets 150 - 400 K/uL 113  127  126       Chemistry      Component Value Date/Time   NA 139 02/01/2024 1215   NA 140 11/02/2016 1130   K 3.5 02/01/2024 1215   K 3.8 11/02/2016 1130   CL 103 02/01/2024 1215   CL 105 05/02/2012 0822   CO2 26 02/01/2024 1215   CO2 26 11/02/2016 1130   BUN 13 02/01/2024  1215   BUN 14.7 11/02/2016 1130   CREATININE 1.17 (H) 02/01/2024 1215   CREATININE 0.84 12/05/2018 1014   CREATININE 0.8 11/02/2016 1130      Component Value Date/Time   CALCIUM  8.8 (L) 02/01/2024 1215   CALCIUM  9.7 11/02/2016 1130   ALKPHOS 235 (H) 02/01/2024 1215   ALKPHOS 59 11/02/2016 1130   AST 50 (H) 02/01/2024  1215   AST 23 11/02/2016 1130   ALT 33 02/01/2024 1215   ALT 17 11/02/2016 1130   BILITOT 0.4 02/01/2024 1215   BILITOT 0.34 11/02/2016 1130       Vitals:   02/01/24 1254  BP: (!) 143/89  Pulse: 83  Resp: 18  Temp: 97.6 F (36.4 C)  SpO2: 97%   Filed Weights   02/01/24 1254  Weight: 125 lb 3.2 oz (56.8 kg)   Other relevant data reviewed during this visit included CBC, CMP, TSH, CT imaging

## 2024-02-01 NOTE — Assessment & Plan Note (Addendum)
 She has history of intermittent kidney failure We will monitor that closely

## 2024-02-01 NOTE — Assessment & Plan Note (Addendum)
This is likely due to recent treatment. The patient denies recent history of bleeding such as epistaxis, hematuria or hematochezia. She is asymptomatic from the low platelet count. I will observe for now.  she does not require transfusion now. I will continue the chemotherapy at current dose without dosage adjustment.  If the thrombocytopenia gets progressive worse in the future, I might have to delay her treatment or adjust the chemotherapy dose.   

## 2024-02-01 NOTE — Assessment & Plan Note (Addendum)
 Due to side effects of treatment I plan to increase the dose of Synthroid 

## 2024-02-01 NOTE — Telephone Encounter (Signed)
 Called and given below message. She verbalized understanding. Rx sent to her preferred pharmacy.

## 2024-02-02 LAB — T4: T4, Total: 8.9 ug/dL (ref 4.5–12.0)

## 2024-02-05 ENCOUNTER — Other Ambulatory Visit (HOSPITAL_COMMUNITY): Payer: Self-pay

## 2024-02-06 ENCOUNTER — Other Ambulatory Visit (HOSPITAL_COMMUNITY): Payer: Self-pay

## 2024-02-07 ENCOUNTER — Other Ambulatory Visit: Payer: Self-pay

## 2024-02-12 ENCOUNTER — Other Ambulatory Visit: Payer: Self-pay

## 2024-02-12 ENCOUNTER — Encounter: Payer: Self-pay | Admitting: Hematology and Oncology

## 2024-02-12 ENCOUNTER — Other Ambulatory Visit (HOSPITAL_COMMUNITY): Payer: Self-pay

## 2024-02-12 NOTE — Progress Notes (Signed)
 Specialty Pharmacy Refill Coordination Note  Kayla Mcgrath is a 79 y.o. female contacted today regarding refills of specialty medication(s) Lenvatinib  Mesylate (LENVIMA )   Patient requested [name] at [facility] at [place] date: 02/13/24   Medication will be filled on: 02/12/24

## 2024-02-13 ENCOUNTER — Other Ambulatory Visit (HOSPITAL_COMMUNITY): Payer: Self-pay

## 2024-02-20 ENCOUNTER — Inpatient Hospital Stay: Attending: Hematology and Oncology

## 2024-02-20 ENCOUNTER — Inpatient Hospital Stay

## 2024-02-20 ENCOUNTER — Inpatient Hospital Stay: Admitting: Hematology and Oncology
